# Patient Record
Sex: Female | Born: 1940
Health system: Southern US, Community
[De-identification: ages and names within clinical notes are randomized; demographics above are authoritative.]

## PROBLEM LIST (undated history)

## (undated) DIAGNOSIS — G35 Multiple sclerosis: Secondary | ICD-10-CM

## (undated) DIAGNOSIS — M810 Age-related osteoporosis without current pathological fracture: Secondary | ICD-10-CM

## (undated) DIAGNOSIS — M1990 Unspecified osteoarthritis, unspecified site: Secondary | ICD-10-CM

## (undated) DIAGNOSIS — N8110 Cystocele, unspecified: Secondary | ICD-10-CM

## (undated) DIAGNOSIS — T7840XA Allergy, unspecified, initial encounter: Secondary | ICD-10-CM

## (undated) DIAGNOSIS — K219 Gastro-esophageal reflux disease without esophagitis: Secondary | ICD-10-CM

## (undated) DIAGNOSIS — I8390 Asymptomatic varicose veins of unspecified lower extremity: Secondary | ICD-10-CM

## (undated) DIAGNOSIS — I5022 Chronic systolic (congestive) heart failure: Secondary | ICD-10-CM

## (undated) DIAGNOSIS — E785 Hyperlipidemia, unspecified: Secondary | ICD-10-CM

## (undated) DIAGNOSIS — R079 Chest pain, unspecified: Secondary | ICD-10-CM

## (undated) DIAGNOSIS — H269 Unspecified cataract: Secondary | ICD-10-CM

## (undated) DIAGNOSIS — I340 Nonrheumatic mitral (valve) insufficiency: Secondary | ICD-10-CM

## (undated) DIAGNOSIS — I83009 Varicose veins of unspecified lower extremity with ulcer of unspecified site: Secondary | ICD-10-CM

## (undated) DIAGNOSIS — F329 Major depressive disorder, single episode, unspecified: Secondary | ICD-10-CM

## (undated) DIAGNOSIS — G35D Multiple sclerosis, unspecified: Secondary | ICD-10-CM

## (undated) DIAGNOSIS — F32A Depression, unspecified: Secondary | ICD-10-CM

## (undated) DIAGNOSIS — L97909 Non-pressure chronic ulcer of unspecified part of unspecified lower leg with unspecified severity: Secondary | ICD-10-CM

## (undated) DIAGNOSIS — I4891 Unspecified atrial fibrillation: Secondary | ICD-10-CM

## (undated) DIAGNOSIS — I428 Other cardiomyopathies: Secondary | ICD-10-CM

## (undated) HISTORY — DX: Multiple sclerosis, unspecified: G35.D

## (undated) HISTORY — DX: Multiple sclerosis: G35

## (undated) HISTORY — DX: Varicose veins of unspecified lower extremity with ulcer of unspecified site: I83.009

## (undated) HISTORY — PX: LIPOMA EXCISION: SHX5283

## (undated) HISTORY — DX: Age-related osteoporosis without current pathological fracture: M81.0

## (undated) HISTORY — DX: Unspecified cataract: H26.9

## (undated) HISTORY — DX: Major depressive disorder, single episode, unspecified: F32.9

## (undated) HISTORY — DX: Other cardiomyopathies: I42.8

## (undated) HISTORY — DX: Varicose veins of unspecified lower extremity with ulcer of unspecified site: L97.909

## (undated) HISTORY — PX: EYE SURGERY: SHX253

## (undated) HISTORY — DX: Nonrheumatic mitral (valve) insufficiency: I34.0

## (undated) HISTORY — DX: Hyperlipidemia, unspecified: E78.5

## (undated) HISTORY — DX: Asymptomatic varicose veins of unspecified lower extremity: I83.90

## (undated) HISTORY — DX: Chest pain, unspecified: R07.9

## (undated) HISTORY — DX: Depression, unspecified: F32.A

## (undated) HISTORY — DX: Unspecified osteoarthritis, unspecified site: M19.90

## (undated) HISTORY — PX: BLADDER SURGERY: SHX569

## (undated) HISTORY — DX: Chronic systolic (congestive) heart failure: I50.22

## (undated) HISTORY — DX: Cystocele, unspecified: N81.10

## (undated) HISTORY — DX: Gastro-esophageal reflux disease without esophagitis: K21.9

## (undated) HISTORY — DX: Allergy, unspecified, initial encounter: T78.40XA

## (undated) HISTORY — DX: Unspecified atrial fibrillation: I48.91

## (undated) HISTORY — PX: INGUINAL HERNIA REPAIR: SUR1180

## (undated) HISTORY — PX: OTHER SURGICAL HISTORY: SHX169

---

## 1998-05-15 ENCOUNTER — Ambulatory Visit (HOSPITAL_COMMUNITY): Admission: RE | Admit: 1998-05-15 | Discharge: 1998-05-15 | Payer: Self-pay | Admitting: Internal Medicine

## 1998-05-15 ENCOUNTER — Encounter: Payer: Self-pay | Admitting: Internal Medicine

## 1999-10-19 ENCOUNTER — Other Ambulatory Visit: Admission: RE | Admit: 1999-10-19 | Discharge: 1999-10-19 | Payer: Self-pay | Admitting: Internal Medicine

## 2000-05-07 ENCOUNTER — Encounter: Payer: Self-pay | Admitting: Internal Medicine

## 2000-05-07 ENCOUNTER — Ambulatory Visit (HOSPITAL_COMMUNITY): Admission: RE | Admit: 2000-05-07 | Discharge: 2000-05-07 | Payer: Self-pay | Admitting: Internal Medicine

## 2001-12-23 ENCOUNTER — Other Ambulatory Visit: Admission: RE | Admit: 2001-12-23 | Discharge: 2001-12-23 | Payer: Self-pay | Admitting: Internal Medicine

## 2002-08-04 ENCOUNTER — Encounter (HOSPITAL_BASED_OUTPATIENT_CLINIC_OR_DEPARTMENT_OTHER): Admission: RE | Admit: 2002-08-04 | Discharge: 2002-11-02 | Payer: Self-pay | Admitting: Internal Medicine

## 2002-09-02 ENCOUNTER — Encounter: Payer: Self-pay | Admitting: Internal Medicine

## 2002-09-02 ENCOUNTER — Ambulatory Visit (HOSPITAL_COMMUNITY): Admission: RE | Admit: 2002-09-02 | Discharge: 2002-09-02 | Payer: Self-pay | Admitting: Internal Medicine

## 2002-09-20 ENCOUNTER — Encounter: Payer: Self-pay | Admitting: Internal Medicine

## 2002-09-20 ENCOUNTER — Inpatient Hospital Stay (HOSPITAL_COMMUNITY): Admission: RE | Admit: 2002-09-20 | Discharge: 2002-09-24 | Payer: Self-pay | Admitting: Internal Medicine

## 2004-04-30 ENCOUNTER — Ambulatory Visit: Payer: Self-pay

## 2004-05-24 ENCOUNTER — Ambulatory Visit: Payer: Self-pay | Admitting: Internal Medicine

## 2004-06-19 ENCOUNTER — Ambulatory Visit: Payer: Self-pay

## 2004-06-26 ENCOUNTER — Ambulatory Visit: Payer: Self-pay | Admitting: *Deleted

## 2004-07-23 ENCOUNTER — Ambulatory Visit: Payer: Self-pay | Admitting: Internal Medicine

## 2004-08-21 ENCOUNTER — Ambulatory Visit: Payer: Self-pay | Admitting: Internal Medicine

## 2004-08-22 ENCOUNTER — Ambulatory Visit: Payer: Self-pay | Admitting: Cardiology

## 2004-11-05 ENCOUNTER — Ambulatory Visit: Payer: Self-pay | Admitting: Internal Medicine

## 2005-01-07 ENCOUNTER — Ambulatory Visit: Payer: Self-pay | Admitting: Cardiology

## 2005-01-28 ENCOUNTER — Ambulatory Visit: Payer: Self-pay | Admitting: Cardiology

## 2005-02-05 ENCOUNTER — Ambulatory Visit: Payer: Self-pay | Admitting: Internal Medicine

## 2005-02-11 ENCOUNTER — Ambulatory Visit: Payer: Self-pay | Admitting: Cardiology

## 2005-03-11 ENCOUNTER — Ambulatory Visit: Payer: Self-pay | Admitting: Cardiology

## 2005-03-25 ENCOUNTER — Ambulatory Visit: Payer: Self-pay | Admitting: Cardiology

## 2005-04-22 ENCOUNTER — Ambulatory Visit: Payer: Self-pay | Admitting: Cardiology

## 2005-05-20 ENCOUNTER — Ambulatory Visit: Payer: Self-pay | Admitting: Cardiology

## 2005-06-05 ENCOUNTER — Ambulatory Visit: Payer: Self-pay | Admitting: Internal Medicine

## 2005-06-28 ENCOUNTER — Ambulatory Visit: Payer: Self-pay | Admitting: Cardiology

## 2005-08-01 ENCOUNTER — Ambulatory Visit: Payer: Self-pay | Admitting: Cardiology

## 2005-08-29 ENCOUNTER — Ambulatory Visit: Payer: Self-pay | Admitting: Cardiology

## 2005-08-30 ENCOUNTER — Ambulatory Visit: Payer: Self-pay | Admitting: Internal Medicine

## 2005-09-11 ENCOUNTER — Ambulatory Visit: Payer: Self-pay | Admitting: Internal Medicine

## 2005-09-12 ENCOUNTER — Ambulatory Visit: Payer: Self-pay

## 2005-09-12 ENCOUNTER — Encounter: Payer: Self-pay | Admitting: Cardiology

## 2005-09-26 ENCOUNTER — Ambulatory Visit: Payer: Self-pay | Admitting: Cardiology

## 2005-10-22 ENCOUNTER — Ambulatory Visit: Payer: Self-pay | Admitting: Internal Medicine

## 2005-10-24 ENCOUNTER — Ambulatory Visit: Payer: Self-pay | Admitting: Cardiology

## 2005-10-25 ENCOUNTER — Ambulatory Visit: Payer: Self-pay | Admitting: Cardiology

## 2005-11-05 ENCOUNTER — Ambulatory Visit: Payer: Self-pay | Admitting: Internal Medicine

## 2005-11-21 ENCOUNTER — Ambulatory Visit: Payer: Self-pay | Admitting: Cardiology

## 2005-11-28 ENCOUNTER — Ambulatory Visit: Payer: Self-pay | Admitting: Internal Medicine

## 2005-12-19 ENCOUNTER — Ambulatory Visit: Payer: Self-pay | Admitting: Internal Medicine

## 2006-01-02 ENCOUNTER — Ambulatory Visit: Payer: Self-pay | Admitting: Cardiology

## 2006-01-17 ENCOUNTER — Ambulatory Visit: Payer: Self-pay | Admitting: Cardiology

## 2006-01-24 ENCOUNTER — Other Ambulatory Visit: Admission: RE | Admit: 2006-01-24 | Discharge: 2006-01-24 | Payer: Self-pay | Admitting: Obstetrics and Gynecology

## 2006-01-31 ENCOUNTER — Ambulatory Visit: Payer: Self-pay | Admitting: Cardiology

## 2006-03-13 ENCOUNTER — Ambulatory Visit: Payer: Self-pay | Admitting: Cardiology

## 2006-03-13 ENCOUNTER — Ambulatory Visit: Payer: Self-pay | Admitting: Internal Medicine

## 2006-03-27 ENCOUNTER — Ambulatory Visit: Payer: Self-pay | Admitting: Cardiology

## 2006-04-24 ENCOUNTER — Ambulatory Visit: Payer: Self-pay | Admitting: *Deleted

## 2006-05-22 ENCOUNTER — Ambulatory Visit: Payer: Self-pay | Admitting: Internal Medicine

## 2006-06-19 ENCOUNTER — Ambulatory Visit: Payer: Self-pay | Admitting: Cardiology

## 2006-07-17 ENCOUNTER — Ambulatory Visit: Payer: Self-pay | Admitting: Internal Medicine

## 2006-07-31 ENCOUNTER — Ambulatory Visit: Payer: Self-pay | Admitting: Cardiology

## 2006-09-02 ENCOUNTER — Ambulatory Visit: Payer: Self-pay | Admitting: Cardiology

## 2006-09-22 ENCOUNTER — Ambulatory Visit: Payer: Self-pay | Admitting: Cardiovascular Disease

## 2006-10-10 ENCOUNTER — Ambulatory Visit: Payer: Self-pay | Admitting: Internal Medicine

## 2006-10-20 ENCOUNTER — Ambulatory Visit: Payer: Self-pay | Admitting: Cardiology

## 2006-11-18 ENCOUNTER — Ambulatory Visit: Payer: Self-pay | Admitting: Cardiology

## 2006-11-19 ENCOUNTER — Ambulatory Visit: Payer: Self-pay | Admitting: Internal Medicine

## 2006-11-19 LAB — CONVERTED CEMR LAB
ALT: 26 units/L (ref 0–40)
AST: 23 units/L (ref 0–37)
Albumin: 4 g/dL (ref 3.5–5.2)
Alkaline Phosphatase: 100 units/L (ref 39–117)
BUN: 8 mg/dL (ref 6–23)
Basophils Absolute: 0.1 10*3/uL (ref 0.0–0.1)
Basophils Relative: 0.9 % (ref 0.0–1.0)
Bilirubin Urine: NEGATIVE
Bilirubin, Direct: 0.1 mg/dL (ref 0.0–0.3)
CO2: 31 meq/L (ref 19–32)
Calcium: 9.4 mg/dL (ref 8.4–10.5)
Chloride: 103 meq/L (ref 96–112)
Creatinine, Ser: 0.7 mg/dL (ref 0.4–1.2)
Crystals: NEGATIVE
Eosinophils Absolute: 0.3 10*3/uL (ref 0.0–0.6)
Eosinophils Relative: 4.5 % (ref 0.0–5.0)
GFR calc Af Amer: 108 mL/min
GFR calc non Af Amer: 89 mL/min
Glucose, Bld: 91 mg/dL (ref 70–99)
HCT: 42.8 % (ref 36.0–46.0)
Hemoglobin: 14.4 g/dL (ref 12.0–15.0)
Ketones, ur: NEGATIVE mg/dL
Lymphocytes Relative: 28 % (ref 12.0–46.0)
MCHC: 33.8 g/dL (ref 30.0–36.0)
MCV: 86.1 fL (ref 78.0–100.0)
Monocytes Absolute: 0.6 10*3/uL (ref 0.2–0.7)
Monocytes Relative: 7.9 % (ref 3.0–11.0)
Mucus, UA: NEGATIVE
Neutro Abs: 4.2 10*3/uL (ref 1.4–7.7)
Neutrophils Relative %: 58.7 % (ref 43.0–77.0)
Nitrite: NEGATIVE
Platelets: 244 10*3/uL (ref 150–400)
Potassium: 4.6 meq/L (ref 3.5–5.1)
RBC: 4.97 M/uL (ref 3.87–5.11)
RDW: 13.9 % (ref 11.5–14.6)
Sodium: 138 meq/L (ref 135–145)
Specific Gravity, Urine: 1.01 (ref 1.000–1.03)
TSH: 0.8 microintl units/mL (ref 0.35–5.50)
Total Bilirubin: 1 mg/dL (ref 0.3–1.2)
Total Protein, Urine: NEGATIVE mg/dL
Total Protein: 7.1 g/dL (ref 6.0–8.3)
Urine Glucose: NEGATIVE mg/dL
Urobilinogen, UA: 0.2 (ref 0.0–1.0)
Vit D, 1,25-Dihydroxy: 26 (ref 20–57)
Vitamin B-12: 540 pg/mL (ref 211–911)
WBC: 7.2 10*3/uL (ref 4.5–10.5)
pH: 6 (ref 5.0–8.0)

## 2006-12-01 ENCOUNTER — Ambulatory Visit: Payer: Self-pay | Admitting: Gastroenterology

## 2006-12-15 ENCOUNTER — Ambulatory Visit: Payer: Self-pay | Admitting: Cardiovascular Disease

## 2007-01-12 ENCOUNTER — Ambulatory Visit: Payer: Self-pay | Admitting: Cardiology

## 2007-02-09 ENCOUNTER — Ambulatory Visit: Payer: Self-pay | Admitting: Cardiology

## 2007-03-09 ENCOUNTER — Ambulatory Visit: Payer: Self-pay | Admitting: Internal Medicine

## 2007-04-06 ENCOUNTER — Ambulatory Visit: Payer: Self-pay | Admitting: Internal Medicine

## 2007-04-10 ENCOUNTER — Encounter: Payer: Self-pay | Admitting: Internal Medicine

## 2007-04-10 DIAGNOSIS — I059 Rheumatic mitral valve disease, unspecified: Secondary | ICD-10-CM | POA: Insufficient documentation

## 2007-04-10 DIAGNOSIS — I872 Venous insufficiency (chronic) (peripheral): Secondary | ICD-10-CM | POA: Insufficient documentation

## 2007-04-10 DIAGNOSIS — F329 Major depressive disorder, single episode, unspecified: Secondary | ICD-10-CM | POA: Insufficient documentation

## 2007-04-10 DIAGNOSIS — L97909 Non-pressure chronic ulcer of unspecified part of unspecified lower leg with unspecified severity: Secondary | ICD-10-CM

## 2007-04-10 DIAGNOSIS — N912 Amenorrhea, unspecified: Secondary | ICD-10-CM | POA: Insufficient documentation

## 2007-04-10 DIAGNOSIS — F321 Major depressive disorder, single episode, moderate: Secondary | ICD-10-CM | POA: Insufficient documentation

## 2007-04-10 DIAGNOSIS — I4891 Unspecified atrial fibrillation: Secondary | ICD-10-CM | POA: Insufficient documentation

## 2007-04-10 DIAGNOSIS — I83009 Varicose veins of unspecified lower extremity with ulcer of unspecified site: Secondary | ICD-10-CM | POA: Insufficient documentation

## 2007-04-13 ENCOUNTER — Ambulatory Visit: Payer: Self-pay | Admitting: Internal Medicine

## 2007-04-13 DIAGNOSIS — I509 Heart failure, unspecified: Secondary | ICD-10-CM | POA: Insufficient documentation

## 2007-04-15 LAB — CONVERTED CEMR LAB
ALT: 26 units/L (ref 0–35)
AST: 21 units/L (ref 0–37)
Albumin: 3.8 g/dL (ref 3.5–5.2)
Alkaline Phosphatase: 81 units/L (ref 39–117)
BUN: 12 mg/dL (ref 6–23)
Basophils Absolute: 0 10*3/uL (ref 0.0–0.1)
Basophils Relative: 0 % (ref 0.0–1.0)
Bilirubin, Direct: 0.2 mg/dL (ref 0.0–0.3)
CO2: 31 meq/L (ref 19–32)
Calcium: 9.2 mg/dL (ref 8.4–10.5)
Chloride: 109 meq/L (ref 96–112)
Cholesterol: 125 mg/dL (ref 0–200)
Creatinine, Ser: 0.9 mg/dL (ref 0.4–1.2)
Eosinophils Absolute: 0.3 10*3/uL (ref 0.0–0.6)
Eosinophils Relative: 3.5 % (ref 0.0–5.0)
GFR calc Af Amer: 80 mL/min
GFR calc non Af Amer: 66 mL/min
Glucose, Bld: 109 mg/dL — ABNORMAL HIGH (ref 70–99)
HCT: 42.7 % (ref 36.0–46.0)
HDL: 44.4 mg/dL (ref >39.0)
Hemoglobin: 14.4 g/dL (ref 12.0–15.0)
LDL Cholesterol: 71 mg/dL (ref 0–99)
Lymphocytes Relative: 23.4 % (ref 12.0–46.0)
MCHC: 33.7 g/dL (ref 30.0–36.0)
MCV: 87.6 fL (ref 78.0–100.0)
Monocytes Absolute: 0.5 10*3/uL (ref 0.2–0.7)
Monocytes Relative: 6.5 % (ref 3.0–11.0)
Neutro Abs: 4.8 10*3/uL (ref 1.4–7.7)
Neutrophils Relative %: 66.6 % (ref 43.0–77.0)
Platelets: 211 10*3/uL (ref 150–400)
Potassium: 4.4 meq/L (ref 3.5–5.1)
RBC: 4.88 M/uL (ref 3.87–5.11)
RDW: 13.5 % (ref 11.5–14.6)
Sodium: 144 meq/L (ref 135–145)
TSH: 1.03 microintl units/mL (ref 0.35–5.50)
Total Bilirubin: 0.9 mg/dL (ref 0.3–1.2)
Total CHOL/HDL Ratio: 2.8
Total Protein: 6.8 g/dL (ref 6.0–8.3)
Triglycerides: 47 mg/dL (ref 0–149)
VLDL: 9 mg/dL (ref 0–40)
WBC: 7.3 10*3/uL (ref 4.5–10.5)

## 2007-04-27 ENCOUNTER — Ambulatory Visit: Payer: Self-pay | Admitting: Cardiovascular Disease

## 2007-05-25 ENCOUNTER — Ambulatory Visit: Payer: Self-pay | Admitting: Internal Medicine

## 2007-06-22 ENCOUNTER — Ambulatory Visit: Payer: Self-pay | Admitting: Cardiology

## 2007-07-17 ENCOUNTER — Ambulatory Visit: Payer: Self-pay | Admitting: Internal Medicine

## 2007-08-14 ENCOUNTER — Ambulatory Visit: Payer: Self-pay | Admitting: Cardiology

## 2007-08-14 ENCOUNTER — Ambulatory Visit: Payer: Self-pay | Admitting: Internal Medicine

## 2007-09-11 ENCOUNTER — Ambulatory Visit: Payer: Self-pay | Admitting: Cardiovascular Disease

## 2007-09-25 ENCOUNTER — Ambulatory Visit: Payer: Self-pay | Admitting: Cardiology

## 2007-10-22 ENCOUNTER — Ambulatory Visit: Payer: Self-pay | Admitting: Cardiology

## 2007-11-10 ENCOUNTER — Telehealth: Payer: Self-pay | Admitting: Internal Medicine

## 2007-11-19 ENCOUNTER — Ambulatory Visit: Payer: Self-pay | Admitting: Cardiology

## 2007-12-17 ENCOUNTER — Ambulatory Visit: Payer: Self-pay | Admitting: Internal Medicine

## 2007-12-28 ENCOUNTER — Ambulatory Visit: Payer: Self-pay | Admitting: Internal Medicine

## 2008-01-18 ENCOUNTER — Ambulatory Visit: Payer: Self-pay | Admitting: Cardiovascular Disease

## 2008-02-01 ENCOUNTER — Ambulatory Visit: Payer: Self-pay | Admitting: Cardiology

## 2008-02-15 ENCOUNTER — Ambulatory Visit: Payer: Self-pay | Admitting: Internal Medicine

## 2008-03-15 ENCOUNTER — Encounter: Payer: Self-pay | Admitting: Internal Medicine

## 2008-03-15 ENCOUNTER — Ambulatory Visit: Payer: Self-pay | Admitting: Cardiology

## 2008-04-01 ENCOUNTER — Ambulatory Visit: Payer: Self-pay | Admitting: Internal Medicine

## 2008-04-20 ENCOUNTER — Ambulatory Visit: Payer: Self-pay | Admitting: Cardiology

## 2008-05-04 ENCOUNTER — Ambulatory Visit: Payer: Self-pay | Admitting: Internal Medicine

## 2008-05-27 ENCOUNTER — Ambulatory Visit: Payer: Self-pay | Admitting: Internal Medicine

## 2008-06-27 ENCOUNTER — Ambulatory Visit: Payer: Self-pay | Admitting: Internal Medicine

## 2008-08-04 ENCOUNTER — Ambulatory Visit: Payer: Self-pay | Admitting: Cardiovascular Disease

## 2008-08-30 DIAGNOSIS — I428 Other cardiomyopathies: Secondary | ICD-10-CM | POA: Insufficient documentation

## 2008-08-30 DIAGNOSIS — E785 Hyperlipidemia, unspecified: Secondary | ICD-10-CM | POA: Insufficient documentation

## 2008-08-30 DIAGNOSIS — I080 Rheumatic disorders of both mitral and aortic valves: Secondary | ICD-10-CM | POA: Insufficient documentation

## 2008-09-15 ENCOUNTER — Ambulatory Visit: Payer: Self-pay | Admitting: Cardiology

## 2008-10-13 ENCOUNTER — Ambulatory Visit: Payer: Self-pay | Admitting: Internal Medicine

## 2008-11-10 ENCOUNTER — Ambulatory Visit: Payer: Self-pay | Admitting: Internal Medicine

## 2008-11-22 ENCOUNTER — Encounter: Payer: Self-pay | Admitting: *Deleted

## 2008-12-15 ENCOUNTER — Inpatient Hospital Stay (HOSPITAL_COMMUNITY): Admission: AD | Admit: 2008-12-15 | Discharge: 2008-12-15 | Payer: Self-pay | Admitting: Obstetrics and Gynecology

## 2008-12-28 ENCOUNTER — Encounter: Payer: Self-pay | Admitting: *Deleted

## 2009-01-30 ENCOUNTER — Ambulatory Visit (HOSPITAL_COMMUNITY): Admission: AD | Admit: 2009-01-30 | Discharge: 2009-01-30 | Payer: Self-pay | Admitting: Obstetrics and Gynecology

## 2009-01-30 ENCOUNTER — Telehealth: Payer: Self-pay | Admitting: Cardiology

## 2009-01-30 ENCOUNTER — Encounter: Payer: Self-pay | Admitting: Internal Medicine

## 2009-01-31 ENCOUNTER — Ambulatory Visit (HOSPITAL_COMMUNITY): Admission: AD | Admit: 2009-01-31 | Discharge: 2009-01-31 | Payer: Self-pay | Admitting: Obstetrics & Gynecology

## 2009-02-01 ENCOUNTER — Encounter (INDEPENDENT_AMBULATORY_CARE_PROVIDER_SITE_OTHER): Payer: Self-pay | Admitting: Cardiology

## 2009-02-01 ENCOUNTER — Encounter: Payer: Self-pay | Admitting: Internal Medicine

## 2009-02-01 ENCOUNTER — Ambulatory Visit (HOSPITAL_COMMUNITY): Admission: RE | Admit: 2009-02-01 | Discharge: 2009-02-01 | Payer: Self-pay | Admitting: Obstetrics and Gynecology

## 2009-02-02 ENCOUNTER — Encounter (INDEPENDENT_AMBULATORY_CARE_PROVIDER_SITE_OTHER): Payer: Self-pay | Admitting: *Deleted

## 2009-02-02 ENCOUNTER — Ambulatory Visit (HOSPITAL_COMMUNITY): Admission: RE | Admit: 2009-02-02 | Discharge: 2009-02-02 | Payer: Self-pay | Admitting: Obstetrics and Gynecology

## 2009-02-03 ENCOUNTER — Ambulatory Visit (HOSPITAL_COMMUNITY): Admission: RE | Admit: 2009-02-03 | Discharge: 2009-02-03 | Payer: Self-pay | Admitting: Obstetrics and Gynecology

## 2009-02-04 ENCOUNTER — Ambulatory Visit (HOSPITAL_COMMUNITY): Admission: RE | Admit: 2009-02-04 | Discharge: 2009-02-04 | Payer: Self-pay | Admitting: Obstetrics and Gynecology

## 2009-02-05 ENCOUNTER — Ambulatory Visit (HOSPITAL_COMMUNITY): Admission: RE | Admit: 2009-02-05 | Discharge: 2009-02-05 | Payer: Self-pay | Admitting: Obstetrics and Gynecology

## 2009-02-16 ENCOUNTER — Ambulatory Visit: Payer: Self-pay | Admitting: Cardiovascular Disease

## 2009-02-16 LAB — CONVERTED CEMR LAB
INR: 1.8
POC INR: 1.8

## 2009-03-23 ENCOUNTER — Ambulatory Visit: Payer: Self-pay | Admitting: Internal Medicine

## 2009-03-23 LAB — CONVERTED CEMR LAB: POC INR: 3.2

## 2009-04-17 ENCOUNTER — Ambulatory Visit: Payer: Self-pay | Admitting: Internal Medicine

## 2009-04-17 DIAGNOSIS — K219 Gastro-esophageal reflux disease without esophagitis: Secondary | ICD-10-CM | POA: Insufficient documentation

## 2009-04-20 ENCOUNTER — Ambulatory Visit: Payer: Self-pay | Admitting: Cardiovascular Disease

## 2009-04-20 ENCOUNTER — Ambulatory Visit: Payer: Self-pay | Admitting: Internal Medicine

## 2009-04-20 DIAGNOSIS — E785 Hyperlipidemia, unspecified: Secondary | ICD-10-CM

## 2009-04-20 DIAGNOSIS — E782 Mixed hyperlipidemia: Secondary | ICD-10-CM | POA: Insufficient documentation

## 2009-04-20 LAB — CONVERTED CEMR LAB
AST: 27 units/L (ref 0–37)
BUN: 12 mg/dL (ref 6–23)
CO2: 28 meq/L (ref 19–32)
Calcium: 8.7 mg/dL (ref 8.4–10.5)
Chloride: 102 meq/L (ref 96–112)
Cholesterol: 140 mg/dL (ref 0–200)
Creatinine, Ser: 0.9 mg/dL (ref 0.4–1.2)
GFR calc non Af Amer: 65.93 mL/min (ref >60)
Glucose, Bld: 98 mg/dL (ref 70–99)
HDL: 42.6 mg/dL (ref >39.00)
LDL Cholesterol: 83 mg/dL (ref 0–99)
POC INR: 2.4
Potassium: 4.1 meq/L (ref 3.5–5.1)
Sodium: 137 meq/L (ref 135–145)
TSH: 1.08 microintl units/mL (ref 0.35–5.50)
Total CHOL/HDL Ratio: 3
Triglycerides: 71 mg/dL (ref 0.0–149.0)
VLDL: 14.2 mg/dL (ref 0.0–40.0)

## 2009-06-06 ENCOUNTER — Ambulatory Visit: Payer: Self-pay | Admitting: Cardiology

## 2009-06-06 ENCOUNTER — Encounter (INDEPENDENT_AMBULATORY_CARE_PROVIDER_SITE_OTHER): Payer: Self-pay | Admitting: Cardiology

## 2009-06-06 LAB — CONVERTED CEMR LAB: POC INR: 2.2

## 2009-07-20 ENCOUNTER — Ambulatory Visit: Payer: Self-pay | Admitting: Internal Medicine

## 2009-07-20 LAB — CONVERTED CEMR LAB: POC INR: 2.5

## 2009-08-17 ENCOUNTER — Ambulatory Visit: Payer: Self-pay | Admitting: Cardiology

## 2009-08-17 LAB — CONVERTED CEMR LAB: POC INR: 2

## 2009-11-07 ENCOUNTER — Ambulatory Visit: Payer: Self-pay | Admitting: Internal Medicine

## 2009-11-07 LAB — CONVERTED CEMR LAB: POC INR: 2.4

## 2009-12-05 ENCOUNTER — Ambulatory Visit: Payer: Self-pay | Admitting: Cardiovascular Disease

## 2009-12-05 LAB — CONVERTED CEMR LAB: POC INR: 2

## 2010-01-03 ENCOUNTER — Ambulatory Visit: Payer: Self-pay | Admitting: Internal Medicine

## 2010-01-03 LAB — CONVERTED CEMR LAB: POC INR: 1.4

## 2010-01-08 ENCOUNTER — Telehealth (INDEPENDENT_AMBULATORY_CARE_PROVIDER_SITE_OTHER): Payer: Self-pay | Admitting: *Deleted

## 2010-01-08 ENCOUNTER — Telehealth: Payer: Self-pay | Admitting: Internal Medicine

## 2010-01-12 ENCOUNTER — Ambulatory Visit: Payer: Self-pay | Admitting: Cardiology

## 2010-01-12 LAB — CONVERTED CEMR LAB: POC INR: 1.9

## 2010-01-18 ENCOUNTER — Ambulatory Visit: Payer: Self-pay | Admitting: Internal Medicine

## 2010-01-18 DIAGNOSIS — M79609 Pain in unspecified limb: Secondary | ICD-10-CM | POA: Insufficient documentation

## 2010-01-18 DIAGNOSIS — M25569 Pain in unspecified knee: Secondary | ICD-10-CM | POA: Insufficient documentation

## 2010-01-18 DIAGNOSIS — N760 Acute vaginitis: Secondary | ICD-10-CM | POA: Insufficient documentation

## 2010-01-18 DIAGNOSIS — M1990 Unspecified osteoarthritis, unspecified site: Secondary | ICD-10-CM | POA: Insufficient documentation

## 2010-01-26 ENCOUNTER — Ambulatory Visit: Payer: Self-pay | Admitting: Cardiovascular Disease

## 2010-01-26 LAB — CONVERTED CEMR LAB: POC INR: 2.5

## 2010-02-02 ENCOUNTER — Encounter: Payer: Self-pay | Admitting: Internal Medicine

## 2010-02-19 ENCOUNTER — Encounter: Payer: Self-pay | Admitting: Internal Medicine

## 2010-02-22 ENCOUNTER — Ambulatory Visit: Payer: Self-pay | Admitting: Internal Medicine

## 2010-02-22 ENCOUNTER — Encounter: Payer: Self-pay | Admitting: Internal Medicine

## 2010-02-22 LAB — CONVERTED CEMR LAB: POC INR: 2

## 2010-03-20 ENCOUNTER — Ambulatory Visit: Payer: Self-pay | Admitting: Cardiovascular Disease

## 2010-03-20 LAB — CONVERTED CEMR LAB: POC INR: 2.9

## 2010-04-19 ENCOUNTER — Ambulatory Visit: Payer: Self-pay | Admitting: Internal Medicine

## 2010-04-26 ENCOUNTER — Ambulatory Visit: Payer: Self-pay | Admitting: Internal Medicine

## 2010-05-01 ENCOUNTER — Ambulatory Visit: Payer: Self-pay | Admitting: Internal Medicine

## 2010-05-01 LAB — CONVERTED CEMR LAB: POC INR: 3.5

## 2010-05-24 ENCOUNTER — Ambulatory Visit: Payer: Self-pay | Admitting: Cardiovascular Disease

## 2010-05-24 LAB — CONVERTED CEMR LAB: POC INR: 2.8

## 2010-06-04 ENCOUNTER — Encounter: Payer: Self-pay | Admitting: Internal Medicine

## 2010-06-04 ENCOUNTER — Ambulatory Visit: Payer: Self-pay

## 2010-06-04 ENCOUNTER — Ambulatory Visit: Payer: Self-pay | Admitting: Internal Medicine

## 2010-06-11 ENCOUNTER — Telehealth: Payer: Self-pay | Admitting: Cardiovascular Disease

## 2010-06-11 LAB — CONVERTED CEMR LAB
BUN: 19 mg/dL (ref 6–23)
CO2: 30 meq/L (ref 19–32)
Calcium: 9.1 mg/dL (ref 8.4–10.5)
Chloride: 101 meq/L (ref 96–112)
Creatinine, Ser: 1 mg/dL (ref 0.4–1.2)
GFR calc non Af Amer: 58.19 mL/min — ABNORMAL LOW (ref >60.00)
Glucose, Bld: 113 mg/dL — ABNORMAL HIGH (ref 70–99)
Potassium: 4.9 meq/L (ref 3.5–5.1)
Pro B Natriuretic peptide (BNP): 165.2 pg/mL — ABNORMAL HIGH (ref 0.0–100.0)
Sodium: 138 meq/L (ref 135–145)
TSH: 1.54 microintl units/mL (ref 0.35–5.50)

## 2010-06-14 ENCOUNTER — Ambulatory Visit: Payer: Self-pay

## 2010-06-14 ENCOUNTER — Encounter: Payer: Self-pay | Admitting: Internal Medicine

## 2010-06-14 ENCOUNTER — Ambulatory Visit: Payer: Self-pay | Admitting: Internal Medicine

## 2010-06-14 ENCOUNTER — Encounter: Payer: Self-pay | Admitting: Cardiovascular Disease

## 2010-06-14 ENCOUNTER — Ambulatory Visit (HOSPITAL_COMMUNITY)
Admission: RE | Admit: 2010-06-14 | Discharge: 2010-06-14 | Payer: Self-pay | Source: Home / Self Care | Attending: Internal Medicine | Admitting: Internal Medicine

## 2010-07-05 ENCOUNTER — Telehealth: Payer: Self-pay | Admitting: Internal Medicine

## 2010-07-09 ENCOUNTER — Ambulatory Visit
Admission: RE | Admit: 2010-07-09 | Discharge: 2010-07-09 | Payer: Self-pay | Source: Home / Self Care | Attending: Cardiology | Admitting: Cardiology

## 2010-07-09 LAB — CONVERTED CEMR LAB: POC INR: 3.2

## 2010-07-12 ENCOUNTER — Telehealth: Payer: Self-pay | Admitting: Internal Medicine

## 2010-07-24 NOTE — Cardiovascular Report (Signed)
Summary: [facility] Patient Summary  [facility] Patient Summary   Imported By: Roderic Ovens 06/14/2009 13:59:18  _____________________________________________________________________  External Attachment:    Type:   Image     Comment:   External Document

## 2010-07-24 NOTE — Medication Information (Signed)
Summary: rov/ln  Anticoagulant Therapy  Managed by: Cloyde Reams, RN, BSN Referring MD: Dietrich Pates Supervising MD: Daleen Squibb MD, Maisie Fus Indication 1: Atrial Fibrillation (ICD-427.31) Lab Used: [facility] Site: [facility] INR POC 1.9 INR RANGE 2 - 3  Dietary changes: no    Health status changes: no    Bleeding/hemorrhagic complications: yes       Details: Sl amt of BRB assoc with BM, no further episodes.    Recent/future hospitalizations: no    Any changes in medication regimen? no    Recent/future dental: no  Any missed doses?: no       Is patient compliant with meds? yes       Allergies: No Known Drug Allergies  Anticoagulation Management History:      The patient is taking warfarin and comes in today for a routine follow up visit.  Positive risk factors for bleeding include an age of 6 years or older.  The bleeding index is 'intermediate risk'.  Positive CHADS2 values include History of CHF.  Negative CHADS2 values include Age > 76 years old.  The start date was 12/23/2001.  Her last INR was 1.8.  Anticoagulation responsible provider: Daleen Squibb MD, Maisie Fus.  INR POC: 1.9.  Cuvette Lot#: [id number].  Exp: 03/2011.    Anticoagulation Management Assessment/Plan:      The patient's current anticoagulation dose is Coumadin 5 mg  tabs: Take as directed by coumadin clinic..  The target INR is 2 - 3.  The next INR is due 01/26/2010.  Anticoagulation instructions were given to patient.  Results were reviewed/authorized by Cloyde Reams, RN, BSN.  She was notified by Cloyde Reams RN.         Prior Anticoagulation Instructions: INR 1.4  Take 2 tabs today and tomorrow and then resume 1.5 tabs on Sunday, Tuesday, and Thursday and 1 tab on Monday, Wednesday, Friday, and Saturday.  Re-check INR in 10 days.  Current Anticoagulation Instructions: INR 1.9  Take 1.5 tablets today, then resume same dosage 1 tablet daily except 1.5 tablets on Sundays, Tuesdays, and Thursdays.  Recheck in 2 weeks.    Prescriptions: COUMADIN 5 MG  TABS (WARFARIN SODIUM) Take as directed by coumadin clinic. Brand medically necessary #60 x 1   Entered by:   Erika Johnson RN   Authorized by:   Paula Virginia Ross, MD, FACC   Signed by:   Erika Johnson RN on 01/12/2010   Method used:   Electronically to        [facility]  [address] # [facility]* (retail)       [address]       [place], [place]  [address]       Ph: [phone] or [phone]       Fax: [phone]   RxID:   [id number]

## 2010-07-24 NOTE — Assessment & Plan Note (Signed)
Summary: 3 MO ROV /NWS  #   Vital Signs:  Patient profile:   70 year old female Height:      71 inches Weight:      175 pounds BMI:     24.50 Temp:     97.1 degrees F oral Pulse rate:   76 / minute Pulse rhythm:   regular Resp:     16 per minute BP sitting:   110 / 86  (left arm) Cuff size:   regular  Vitals Entered By: Lanier Prude, CMA(AAMA) (April 26, 2010 10:08 AM) CC: 3 mo f/u Is Patient Diabetic? No Comments pt states she is not taking Toviaz   CC:  3 mo f/u.  History of Present Illness: The patient presents for a follow up of OA, A fib, hyperlipidemia   Current Medications (verified): 1)  Klor-Con M20 20 Meq  Tbcr (Potassium Chloride Crys Cr) .... 1/2 Once Daily 2)  Coumadin 5 Mg  Tabs (Warfarin Sodium) .... Take As Directed By Coumadin Clinic. 3)  Coreg 3.125 Mg  Tabs (Carvedilol) .... Two Times A Day 4)  Tikosyn 500 Mcg  Caps (Dofetilide) .... Two Times A Day 5)  Lipitor 20 Mg  Tabs (Atorvastatin Calcium) .... 1/2 Once Daily 6)  Oxybutynin Chloride 5 Mg Tb24 (Oxybutynin Chloride) .Marland Kitchen.. 1 By Mouth Qd 7)  Calicum .... Daily 8)  Omeprazole 40 Mg Cpdr (Omeprazole) .Marland Kitchen.. 1 Tablet Every Day 9)  Toviaz 8 Mg Xr24h-Tab (Fesoterodine Fumarate) .Marland Kitchen.. 1 By Mouth Daily. 10)  Premarin 0.625 Mg/gm Crea (Estrogens, Conjugated) .... Use 1 G Pv At Bedtime X 3 D, Then Every Other Day X 6 D, Then Weekly 11)  Trimo-San 0.025 % Gel (Oxyquinolone Sulfate) .... Use Once Daily Pv 12)  Tramadol Hcl 50 Mg Tabs (Tramadol Hcl) .Marland Kitchen.. 1-2 Tabs By Mouth Two Times A Day As Needed Pain  Allergies (verified): No Known Drug Allergies  Past History:  Past Medical History: Last updated: 01/18/2010 CARDIOMYOPATHY (ICD-425.4) MITRAL REGURGITATION, MILD (ICD-396.3) DYSLIPIDEMIA (ICD-272.4) CONGESTIVE HEART FAILURE (ICD-428.0) ATRIAL FIBRILLATION (ICD-427.31) DISEASE, MITRAL VALVE NEC/NOS (ICD-394.9) STASIS ULCER (ICD-454.0) ABSENCE, MENSTRUATION (ICD-626.0) CORONARY ARTERY DISEASE, FAMILY  HX (ICD-V17.3) VENOUS INSUFFICIENCY, LEGS (ICD-459.81) DEPRESSION (ICD-311) Gyn Dr Layne Benton Urol Dr McDearmid   Osteoarthritis  Social History: Last updated: 04/13/2007 Retired Divorced Never Smoked Alcohol use-no  Review of Systems  The patient denies fever, weight loss, dyspnea on exertion, and abdominal pain.    Physical Exam  General:  Well developed, well nourished, in no acute distress. Mouth:  Oral mucosa and oropharynx without lesions or exudates.  Teeth in good repair. Neck:  JVP normal. No bruits Lungs:  clear to auscultation. No rales, or wheeze Heart:  irregular rate and rhythm. S1, S2. No S3. No murmur Abdomen:  supple. No hepatomegaly Msk:  B knees w/crepitus R big toe deformed Neurologic:  No cranial nerve deficits noted. Station and gait are normal. Plantar reflexes are down-going bilaterally. DTRs are symmetrical throughout. Sensory, motor and coordinative functions appear intact. Skin:  Hyperpigmented lower legs Psych:  Cognition and judgment appear intact. Alert and cooperative with normal attention span and concentration. No apparent delusions, illusions, hallucinations   Impression & Recommendations:  Problem # 1:  ATRIAL FIBRILLATION (ICD-427.31) Assessment Unchanged  Her updated medication list for this problem includes:    Coumadin 5 Mg Tabs (Warfarin sodium) .Marland Kitchen... Take as directed by coumadin clinic.    Coreg 3.125 Mg Tabs (Carvedilol) .Marland Kitchen..Marland Kitchen Two times a day    Tikosyn 500 Mcg Caps (  Dofetilide) .Marland Kitchen..Marland Kitchen Two times a day  Problem # 2:  DEPRESSION (ICD-311) Assessment: Improved  Problem # 3:  COUMADIN THERAPY (ICD-V58.61) Assessment: Unchanged On the regimen reflected in the chart    Problem # 4:  OSTEOARTHRITIS (ICD-715.90) Assessment: Unchanged  Her updated medication list for this problem includes:    Tramadol Hcl 50 Mg Tabs (Tramadol hcl) .Marland Kitchen... 1-2 tabs by mouth two times a day as needed pain  Problem # 5:  PURE HYPERCHOLESTEROLEMIA  (ICD-272.0) Assessment: Improved  Her updated medication list for this problem includes:    Lipitor 20 Mg Tabs (Atorvastatin calcium) .Marland Kitchen... 1/2 once daily  Complete Medication List: 1)  Klor-con M20 20 Meq Tbcr (Potassium chloride crys cr) .... 1/2 once daily 2)  Coumadin 5 Mg Tabs (Warfarin sodium) .... Take as directed by coumadin clinic. 3)  Coreg 3.125 Mg Tabs (Carvedilol) .... Two times a day 4)  Tikosyn 500 Mcg Caps (Dofetilide) .... Two times a day 5)  Lipitor 20 Mg Tabs (Atorvastatin calcium) .... 1/2 once daily 6)  Oxybutynin Chloride 5 Mg Tb24 (Oxybutynin chloride) .Marland Kitchen.. 1 by mouth qd 7)  Omeprazole 40 Mg Cpdr (Omeprazole) .Marland Kitchen.. 1 tablet every day 8)  Premarin 0.625 Mg/gm Crea (Estrogens, conjugated) .... Use 1 g pv at bedtime x 3 d, then every other day x 6 d, then weekly 9)  Trimo-san 0.025 % Gel (Oxyquinolone sulfate) .... Use once daily pv 10)  Tramadol Hcl 50 Mg Tabs (Tramadol hcl) .Marland Kitchen.. 1-2 tabs by mouth two times a day as needed pain 11)  Vitamin D 1000 Unit Tabs (Cholecalciferol) .Marland Kitchen.. 1 by mouth qd  Other Orders: Tdap => 43yrs IM (40981) Admin 1st Vaccine (19147) Zoster (Shingles) Vaccine Live [phone]) Admin of Any Addtl Vaccine (21308)  Patient Instructions: 1)  Please schedule a follow-up appointment in 3 months well w/labs. Prescriptions: TIKOSYN 500 MCG  CAPS (DOFETILIDE) two times a day  #60 Capsule x 11   Entered and Authorized by:   Tresa Garter MD   Signed by:   Tresa Garter MD on 04/26/2010   Method used:   Print then Give to Patient   RxID:   [id number] OXYBUTYNIN CHLORIDE 5 MG TB24 (OXYBUTYNIN CHLORIDE) 1 by mouth qd  #30 Tablet x 11   Entered and Authorized by:   Tresa Garter MD   Signed by:   Tresa Garter MD on 04/26/2010   Method used:   Print then Give to Patient   RxID:   [id number] COREG 3.125 MG  TABS (CARVEDILOL) two times a day  #60 Tablet x 11   Entered and Authorized by:   Tresa Garter MD    Signed by:   Tresa Garter MD on 04/26/2010   Method used:   Print then Give to Patient   RxID:   [id number] COUMADIN 5 MG  TABS (WARFARIN SODIUM) Take as directed by coumadin clinic. Brand medically necessary #40 x 11   Entered and Authorized by:   Tresa Garter MD   Signed by:   Tresa Garter MD on 04/26/2010   Method used:   Print then Give to Patient   RxID:   [id number]    Orders Added: 1)  Tdap => 3yrs IM [90715] 2)  Admin 1st Vaccine [90471] 3)  Zoster (Shingles) Vaccine Live [60109] 4)  Admin of Any Addtl Vaccine [90472] 5)  Est. Patient Level IV [32355]   Immunization History:  Influenza Immunization History:    Influenza:  historical (  04/17/2010)  Pneumovax Immunization History:    Pneumovax:  historical (02/02/2009)  Immunizations Administered:  Tetanus Vaccine:    Vaccine Type: Tdap    Site: left deltoid    Mfr: GlaxoSmithKline    Dose: 0.5 ml    Route: IM    Given by: Lanier Prude, CMA(AAMA)    Exp. Date: 04/12/2012    Lot #: [id number]    VIS given: 05/11/08 version given April 26, 2010.  Zostavax # 1:    Vaccine Type: Zostavax    Site: right deltoid    Mfr: Merck    Dose: 0.65    Route: [place]    Given by: Lanier Prude, CMA(AAMA)    Exp. Date: 01/26/2011    Lot #: [id number]    VIS given: 04/05/05 given April 26, 2010.   Immunization History:  Influenza Immunization History:    Influenza:  Historical (04/17/2010)  Pneumovax Immunization History:    Pneumovax:  Historical (02/02/2009)  Immunizations Administered:  Tetanus Vaccine:    Vaccine Type: Tdap    Site: left deltoid    Mfr: GlaxoSmithKline    Dose: 0.5 ml    Route: IM    Given by: Lanier Prude, CMA(AAMA)    Exp. Date: 04/12/2012    Lot #: [id number]    VIS given: 05/11/08 version given April 26, 2010.  Zostavax # 1:    Vaccine Type: Zostavax    Site: right deltoid    Mfr: Merck    Dose: 0.65    Route: [place]    Given by: Lanier Prude, CMA(AAMA)    Exp. Date: 01/26/2011    Lot #: [id number]    VIS given: 04/05/05 given April 26, 2010.

## 2010-07-24 NOTE — Progress Notes (Signed)
Summary: Rf Kcl  Phone Note Refill Request Message from:  Pharmacy  Refills Requested: Medication #1:  KLOR-CON M20 20 MEQ  TBCR 1/2 once daily   Dosage confirmed as above?Dosage Confirmed   Supply Requested: 1 month   Last Refilled: 11/13/2009 last OV with you was 2009.  Medication has been being RF but per policy pt needs OV. Right?   Method Requested: Electronic Initial call taken by: Lanier Prude, [facility]),  January 08, 2010 11:31 AM  Follow-up for Phone Call        ok to ref x 6 Sch OV pls Follow-up by: Tresa Garter MD,  January 08, 2010 1:00 PM  Additional Follow-up for Phone Call Additional follow up Details #1::        Rx called to pharmacy Additional Follow-up by: Lanier Prude, [facility]),  January 08, 2010 2:04 PM    Prescriptions: KLOR-CON M20 20 MEQ  TBCR (POTASSIUM CHLORIDE CRYS CR) 1/2 once daily  #30 x 6   Entered by:   Lanier Prude, [facility])   Authorized by:   Tresa Garter MD   Signed by:   Lanier Prude, [facility]) on 01/08/2010   Method used:   Electronically to        [facility]  [address] # 11350* (retail)       [address]       [place], [place]  [address]       Ph: [phone] or [phone]       Fax: [phone]   RxID:   [id number]

## 2010-07-24 NOTE — Medication Information (Signed)
Summary: rov/sp  Anticoagulant Therapy  Managed by: Weston Brass, PharmD Referring MD: Dietrich Pates Supervising MD: Johney Frame MD, Fayrene Fearing Indication 1: Atrial Fibrillation (ICD-427.31) Lab Used: [facility] Site: [facility] INR POC 1.4 INR RANGE 2 - 3  Dietary changes: yes       Details: has been eating more turnip greens  Health status changes: no    Bleeding/hemorrhagic complications: no    Recent/future hospitalizations: no    Any changes in medication regimen? no    Recent/future dental: no  Any missed doses?: yes     Details: pt states that she may have missed one or two doses  Is patient compliant with meds? yes       Allergies: No Known Drug Allergies  Anticoagulation Management History:      The patient is taking warfarin and comes in today for a routine follow up visit.  Positive risk factors for bleeding include an age of 71 years or older.  The bleeding index is 'intermediate risk'.  Positive CHADS2 values include History of CHF.  Negative CHADS2 values include Age > 25 years old.  The start date was 12/23/2001.  Her last INR was 1.8.  Anticoagulation responsible provider: Leam Madero MD, Fayrene Fearing.  INR POC: 1.4.  Cuvette Lot#: [id number].  Exp: 02/2011.    Anticoagulation Management Assessment/Plan:      The patient's current anticoagulation dose is Coumadin 5 mg  tabs: Take as directed by coumadin clinic..  The target INR is 2 - 3.  The next INR is due 01/12/2010.  Anticoagulation instructions were given to patient.  Results were reviewed/authorized by Weston Brass, PharmD.  She was notified by Dillard Cannon.         Prior Anticoagulation Instructions: INR 2.0  Continue same dose of 1 tablet every day except 1 1/2 tablets on Sunday, Tuesday and Thursday   Current Anticoagulation Instructions: INR 1.4  Take 2 tabs today and tomorrow and then resume 1.5 tabs on Sunday, Tuesday, and Thursday and 1 tab on Monday, Wednesday, Friday, and Saturday.  Re-check INR in 10 days.

## 2010-07-24 NOTE — Letter (Signed)
Summary: [facility]  [address]   [place], [place] [address]   Phone: [phone]  Fax: [phone]     February 01, 2009 MRN: [id number]   [name] [address] [place], [place]  [address]   Dear Ms. RYCE,  This letter is being sent to you as a reminder that it is necessary for you to get your INR/PT checked regularly so that we can optimize your care.  Our records indicate that you were scheduled to have a test done recently.  As of today, we have not received the results of this test.  It is very important that you have your INR checked.  Please call our office at the number listed above to schedule an appointment at your earliest convenience.    If you have recently had your protime checked or have discontinued this medication, please contact our office at the above phone number to clarify this issue.  Thank you for this prompt attention to this important health care matter.  Sincerely,   [facility] Reduction [facility] Team

## 2010-07-24 NOTE — Medication Information (Signed)
Summary: rov/ewj  Anticoagulant Therapy  Managed by: Bethena Midget, RN, BSN Referring MD: Dietrich Pates Supervising MD: Graciela Husbands MD, Viviann Spare Indication 1: Atrial Fibrillation (ICD-427.31) Lab Used: [facility] Site: [facility] INR POC 2.0 INR RANGE 2 - 3  Dietary changes: no    Health status changes: no    Bleeding/hemorrhagic complications: no    Recent/future hospitalizations: yes       Details: Pending Rt foot Surgery 03/05/10 needs to be off for 5 days and Dr Gladstone Lighter has approved pt to stop.   Any changes in medication regimen? no    Recent/future dental: no  Any missed doses?: no       Is patient compliant with meds? yes       Allergies: No Known Drug Allergies  Anticoagulation Management History:      The patient is taking warfarin and comes in today for a routine follow up visit.  Positive risk factors for bleeding include an age of 71 years or older.  The bleeding index is 'intermediate risk'.  Positive CHADS2 values include History of CHF.  Negative CHADS2 values include Age > 48 years old.  The start date was 12/23/2001.  Her last INR was 1.8.  Anticoagulation responsible provider: Graciela Husbands MD, Viviann Spare.  INR POC: 2.0.  Cuvette Lot#: [id number].  Exp: 03/2011.    Anticoagulation Management Assessment/Plan:      The patient's current anticoagulation dose is Coumadin 5 mg  tabs: Take as directed by coumadin clinic..  The target INR is 2 - 3.  The next INR is due 03/15/2010.  Anticoagulation instructions were given to patient.  Results were reviewed/authorized by Bethena Midget, RN, BSN.  She was notified by Bethena Midget, RN, BSN.         Prior Anticoagulation Instructions: INR 2.5  Continue on same dosage 1 tablet daily except 1.5 tablets on Sundays, Tuesdays, and Thursdays.   Recheck in 4 weeks.    Current Anticoagulation Instructions: INR 2.0 Today 10mg s then resume 5mg s everyday except 7.5mg s on Tuesdays, Thursdays and Sundays. Recheck in 3 weeks. Last dose will be 01/27/10 if  surgery is on 02/02/10.

## 2010-07-24 NOTE — Medication Information (Signed)
Summary: rov/tm   Anticoagulant Therapy  Managed by: Weston Brass, PharmD Referring MD: Dietrich Pates Supervising MD: Eden Emms MD, Theron Arista Indication 1: Atrial Fibrillation (ICD-427.31) Lab Used: [facility] Site: [facility] INR POC 2.8 INR RANGE 2 - 3   Health status changes: no    Bleeding/hemorrhagic complications: yes       Details: some vaginal bleeding but only occured once  Recent/future hospitalizations: no    Any changes in medication regimen? no    Recent/future dental: no  Any missed doses?: no       Is patient compliant with meds? yes       Allergies: No Known Drug Allergies  Anticoagulation Management History:      The patient is taking warfarin and comes in today for a routine follow up visit.  Positive risk factors for bleeding include an age of 70 years or older.  The bleeding index is 'intermediate risk'.  Positive CHADS2 values include History of CHF.  Negative CHADS2 values include Age > 52 years old.  The start date was 12/23/2001.  Her last INR was 1.8.  Anticoagulation responsible provider: Eden Emms MD, Theron Arista.  INR POC: 2.8.  Cuvette Lot#: [id number].  Exp: 05/2011.    Anticoagulation Management Assessment/Plan:      The patient's current anticoagulation dose is Coumadin 5 mg  tabs: Take as directed by coumadin clinic..  The target INR is 2 - 3.  The next INR is due 06/21/2010.  Anticoagulation instructions were given to patient.  Results were reviewed/authorized by Weston Brass, PharmD.  She was notified by Weston Brass PharmD.         Prior Anticoagulation Instructions: INR 3.5 Skip today's dose then resume 5mg s daily except 7.5mg s on Tuesdays, Thursdays and Sundays. Recheck in 3 weeks.   Current Anticoagulation Instructions: INR 2.8  Continue same dose of 1 tablet every day except 1 1/2 tablets on Sunday, Tuesday and Thursday.  Recheck INR in 4 weeks.

## 2010-07-24 NOTE — Progress Notes (Signed)
----   Converted from flag ---- ---- 01/08/2010 2:05 PM, Lanier Prude, CMA(AAMA) wrote: Please sched OV with AVP or preferred PCP.  Pt is past due for f/u.   Thanks,  Norva Pavlov ------------------------------  Gave pt appt -phone:  01/18/10@ 930A w/Dr Plotnikov

## 2010-07-24 NOTE — Miscellaneous (Signed)
Summary: delinquent letter---cvrr mailed  Clinical Lists Changes

## 2010-07-24 NOTE — Assessment & Plan Note (Signed)
Medications Added OXYBUTYNIN CHLORIDE 5 MG TB24 (OXYBUTYNIN CHLORIDE) 1 by mouth qd      Allergies Added: NKDA   History of Present Illness: 6 mo f/u  Current Allergies: No known allergies   Past Medical History:    Depression    ef 50% on echo    Atrial fibrillation    Congestive heart failure   Family History:    Cancer  Social History:    Retired    Divorced    Never Smoked    Alcohol use-no   Risk Factors:  Tobacco use:  never Alcohol use:  no    Physical Exam  General:     Well-developed,well-nourished,in no acute distress; alert,appropriate and cooperative throughout examination Head:     Normocephalic and atraumatic without obvious abnormalities. No apparent alopecia or balding. Mouth:     Oral mucosa and oropharynx without lesions or exudates.  Teeth in good repair. Lungs:     Normal respiratory effort, chest expands symmetrically. Lungs are clear to auscultation, no crackles or wheezes. Heart:     Normal rate and regular rhythm. S1 and S2 normal without gallop, murmur, click, rub or other extra sounds. Abdomen:     Bowel sounds positive,abdomen soft and non-tender without masses, organomegaly or hernias noted. Extremities:     trace left pedal edema and trace right pedal edema.   Neurologic:     No cranial nerve deficits noted. Station and gait are normal. Plantar reflexes are down-going bilaterally. DTRs are symmetrical throughout. Sensory, motor and coordinative functions appear intact. Psych:     Cognition and judgment appear intact. Alert and cooperative with normal attention span and concentration. No apparent delusions, illusions, hallucinations    Impression & Recommendations:  Problem # 1:  ATRIAL FIBRILLATION (ICD-427.31) Assessment: Unchanged  Her updated medication list for this problem includes:    Coumadin 5 Mg Tabs (Warfarin sodium) .Marland Kitchen... As directed    Coreg 3.125 Mg Tabs (Carvedilol) .Marland Kitchen..Marland Kitchen Two times a day    Tikosyn 500  Mcg Caps (Dofetilide) .Marland Kitchen..Marland Kitchen Two times a day   Problem # 2:  STASIS ULCER (ICD-454.0) Assessment: Comment Only  Problem # 3:  VENOUS INSUFFICIENCY, LEGS (ICD-459.81) Assessment: Improved  Problem # 4:  CORONARY ARTERY DISEASE, FAMILY HX (ICD-V17.3) Assessment: Unchanged  Complete Medication List: 1)  Klor-con M20 20 Meq Tbcr (Potassium chloride crys cr) .... 1/2 once daily 2)  Coumadin 5 Mg Tabs (Warfarin sodium) .... As directed 3)  Coreg 3.125 Mg Tabs (Carvedilol) .... Two times a day 4)  Tikosyn 500 Mcg Caps (Dofetilide) .... Two times a day 5)  Lipitor 20 Mg Tabs (Atorvastatin calcium) .... 1/2 once daily 6)  Oxybutynin Chloride 5 Mg Tb24 (Oxybutynin chloride) .Marland Kitchen.. 1 by mouth qd   Patient Instructions: 1)  Please schedule a follow-up appointment in 6 months.    Prescriptions: TIKOSYN 500 MCG  CAPS (DOFETILIDE) two times a day  #60 x 12   Entered and Authorized by:   Tresa Garter MD   Signed by:   Tresa Garter MD on 04/13/2007   Method used:   Print then Give to Patient   RxID:   [id number] TIKOSYN 500 MCG  CAPS (DOFETILIDE) two times a day  #60 x 12   Entered and Authorized by:   Tresa Garter MD   Signed by:   Tresa Garter MD on 04/13/2007   Method used:   Electronically sent to ...       [facility]  [address]       [address]       [place], [place]  [address]       Ph: [phone] or [phone]       Fax: [phone]   RxID:   [id number]  ]

## 2010-07-24 NOTE — Letter (Signed)
Summary: [facility] - Delinquent Coumadin 2  Coumadin  [address]   [place], [place] [address]   Phone: [phone]  Fax: [phone]     February 01, 2009 MRN: [id number]   DRUSILLA WAMPOLE [address] [place], [place]  [address]   Dear Ms. Tonia Brooms,  We have attempted to contact you by phone and letter on multiple occasions to contact our office for important blood work associated with the blood thinner, warfarin (Coumadin).  Warfarin is a very important drug that can cause life threatening side effects including, bleeding, and thus requires close laboratory monitoring.  We are unable to accept responsibility for blood thinner-related health problems you may develop because you have not followed our recommendations for appropriate monitoring.  These may include abnormal bleeding occurrences and/or development of blood clots (stroke, heart attack, blood clots in legs or lungs, etc.).  We need for you to contact this office at the number listed above to schedule and complete this very important blood work.  Thank you for your assistance in this urgent matter.  Sincerely,  [facility] Team

## 2010-07-24 NOTE — Medication Information (Signed)
Summary: rov/tm   Anticoagulant Therapy  Managed by: Weston Brass, PharmD Referring MD: Dietrich Pates Supervising MD: Eden Emms MD, Theron Arista Indication 1: Atrial Fibrillation (ICD-427.31) Lab Used: [facility] Site: [facility] INR POC 2.9 INR RANGE 2 - 3  Dietary changes: no    Health status changes: no    Bleeding/hemorrhagic complications: no    Recent/future hospitalizations: no    Any changes in medication regimen? yes       Details: took a few days of abx and pain medicine right after surgery  Recent/future dental: no  Any missed doses?: yes     Details: had foot surgery 2 weeks ago.  Was off Coumadin x 1 week.  Has been taking Coumadin for 2 weeks   Is patient compliant with meds? yes       Allergies: No Known Drug Allergies  Anticoagulation Management History:      The patient is taking warfarin and comes in today for a routine follow up visit.  Positive risk factors for bleeding include an age of 71 years or older.  The bleeding index is 'intermediate risk'.  Positive CHADS2 values include History of CHF.  Negative CHADS2 values include Age > 71 years old.  The start date was 12/23/2001.  Her last INR was 1.8.  Anticoagulation responsible provider: Eden Emms MD, Theron Arista.  INR POC: 2.9.  Cuvette Lot#: [id number].  Exp: 04/2011.    Anticoagulation Management Assessment/Plan:      The patient's current anticoagulation dose is Coumadin 5 mg  tabs: Take as directed by coumadin clinic..  The target INR is 2 - 3.  The next INR is due 04/17/2010.  Anticoagulation instructions were given to patient.  Results were reviewed/authorized by Weston Brass, PharmD.  She was notified by Weston Brass PharmD.         Prior Anticoagulation Instructions: INR 2.0 Today 10mg s then resume 5mg s everyday except 7.5mg s on Tuesdays, Thursdays and Sundays. Recheck in 3 weeks. Last dose will be 01/27/10 if surgery is on 02/02/10.   Current Anticoagulation Instructions: INR 2.9  Continue same dose of 1 tablet every  day except 1 1/2 tablets on Sunday, Tuesday and Thursday.  Recheck INR in 4 weeks.

## 2010-07-24 NOTE — Progress Notes (Signed)
Summary: pt requested appt today--refused to schedule later in week  Phone Note Call from Patient   Caller: Patient Call For: CV[facility] Summary of Call: Patient called and stated that she was available to come in for appt today (01/30/2009).  When can she be worked in?   Initial call taken by: Shelby Dubin PharmD, BCPS, CPP,  January 31, 2009 8:57 AM  Follow-up for Phone Call        Patient advised that first available appt is on Tuesday 8/10.  Offered to work with patient for any time/day later this week, but due to volume, unable to accomodate request.  Patient refused to schedule and stated "she would call back when she was able".   Follow-up by: Shelby Dubin, PharmD, 1100 am 01/30/2009

## 2010-07-24 NOTE — Medication Information (Signed)
Summary: rov/sp  Anticoagulant Therapy  Managed by: Bethena Midget, RN, BSN Referring MD: Dietrich Pates Supervising MD: Graciela Husbands MD, Viviann Spare Indication 1: Atrial Fibrillation (ICD-427.31) Lab Used: [facility] Site: [facility] INR POC 3.5 INR RANGE 2 - 3  Dietary changes: yes       Details: Has been eating less leafy veggies  Health status changes: no    Bleeding/hemorrhagic complications: no    Recent/future hospitalizations: no    Any changes in medication regimen? no    Recent/future dental: no  Any missed doses?: no       Is patient compliant with meds? yes       Allergies: No Known Drug Allergies  Anticoagulation Management History:      The patient is taking warfarin and comes in today for a routine follow up visit.  Positive risk factors for bleeding include an age of 71 years or older.  The bleeding index is 'intermediate risk'.  Positive CHADS2 values include History of CHF.  Negative CHADS2 values include Age > 55 years old.  The start date was 12/23/2001.  Her last INR was 1.8.  Anticoagulation responsible provider: Graciela Husbands MD, Viviann Spare.  INR POC: 3.5.  Cuvette Lot#: [id number].  Exp: 04/2011.    Anticoagulation Management Assessment/Plan:      The patient's current anticoagulation dose is Coumadin 5 mg  tabs: Take as directed by coumadin clinic..  The target INR is 2 - 3.  The next INR is due 05/24/2010.  Anticoagulation instructions were given to patient.  Results were reviewed/authorized by Bethena Midget, RN, BSN.  She was notified by Bethena Midget, RN, BSN.         Prior Anticoagulation Instructions: INR 2.9  Continue same dose of 1 tablet every day except 1 1/2 tablets on Sunday, Tuesday and Thursday.  Recheck INR in 4 weeks.   Current Anticoagulation Instructions: INR 3.5 Skip today's dose then resume 5mg s daily except 7.5mg s on Tuesdays, Thursdays and Sundays. Recheck in 3 weeks.

## 2010-07-24 NOTE — Letter (Signed)
Summary: Medical Clearance / [facility]  Medical Clearance / [facility]   Imported By: Lennie Odor 02/20/2010 15:10:43  _____________________________________________________________________  External Attachment:    Type:   Image     Comment:   External Document

## 2010-07-24 NOTE — Medication Information (Signed)
Summary: rov/ewj  Anticoagulant Therapy  Managed by: Shelby Dubin, PharmD, BCPS, CPP Referring MD: Dietrich Pates Supervising MD: Graciela Husbands MD, Viviann Spare Indication 1: Atrial Fibrillation (ICD-427.31) Lab Used: [facility] Site: [facility] INR POC 2.5 INR RANGE 2 - 3  Dietary changes: no    Health status changes: no    Bleeding/hemorrhagic complications: no    Recent/future hospitalizations: no    Any changes in medication regimen? no    Recent/future dental: no  Any missed doses?: no       Is patient compliant with meds? yes       Allergies (verified): No Known Drug Allergies  Anticoagulation Management History:      The patient is taking warfarin and comes in today for a routine follow up visit.  Positive risk factors for bleeding include an age of 16 years or older.  The bleeding index is 'intermediate risk'.  Positive CHADS2 values include History of CHF.  Negative CHADS2 values include Age > 90 years old.  The start date was 12/23/2001.  Her last INR was 1.8.  Anticoagulation responsible provider: Graciela Husbands MD, Viviann Spare.  INR POC: 2.5.  Cuvette Lot#: [id number].  Exp: 09/2010.    Anticoagulation Management Assessment/Plan:      The patient's current anticoagulation dose is Coumadin 5 mg  tabs: Take as directed by coumadin clinic..  The target INR is 2 - 3.  The next INR is due 08/17/2009.  Anticoagulation instructions were given to patient.  Results were reviewed/authorized by Shelby Dubin, PharmD, BCPS, CPP.  She was notified by Ysidro Evert, Pharm D Candidate.         Prior Anticoagulation Instructions: INR 2.2  Continue 1.5 tabs Sundays, Tuesdays, Thursdays. Continue 1 tab on all other days.   Recheck in 1 month.  Contact our office immediately if another physician starts a medication  Current Anticoagulation Instructions: INR: 2.5 Continue with same dosage of 1 tablet every day except 1.5 tablets on Tuesdays, Thursdays and Sundays Recheck in 4 weeks

## 2010-07-24 NOTE — Medication Information (Signed)
Summary: rovp  Anticoagulant Therapy  Managed by: Shelby Dubin, PharmD, BCPS, CPP Referring MD: Dietrich Pates Supervising MD: Gala Romney MD, Reuel Boom Indication 1: Atrial Fibrillation (ICD-427.31) Lab Used: [facility] Site: [facility] INR POC 2.2 INR RANGE 2 - 3  Dietary changes: no    Health status changes: no    Bleeding/hemorrhagic complications: no    Recent/future hospitalizations: no    Any changes in medication regimen? no    Recent/future dental: no  Any missed doses?: no       Is patient compliant with meds? yes      Comments: Patient states she saw the urologist on/around 11/30.  She states that the urologist started her on levaquin 500 mg daily, ciprofloxacin 500 mg two times a day, and nitrofurantoin 100 mg two times a day.  She states that she feels poorly today (increased lethargy, fatigue, and chest discomfort).  Allergies: No Known Drug Allergies  Anticoagulation Management History:      The patient is taking warfarin and comes in today for a routine follow up visit.  Positive risk factors for bleeding include an age of 70 years or older.  The bleeding index is 'intermediate risk'.  Positive CHADS2 values include History of CHF.  Negative CHADS2 values include Age > 30 years old.  The start date was 12/23/2001.  Her last INR was 1.8.  Anticoagulation responsible provider: Maribel Luis MD, Reuel Boom.  INR POC: 2.2.  Cuvette Lot#: [id number].  Exp: 07/2010.    Anticoagulation Management Assessment/Plan:      The patient's current anticoagulation dose is Coumadin 5 mg  tabs: Take as directed by coumadin clinic..  The target INR is 2 - 3.  The next INR is due 07/04/2009.  Anticoagulation instructions were given to patient.  Results were reviewed/authorized by Shelby Dubin, PharmD, BCPS, CPP.  She was notified by Shelby Dubin PharmD, BCPS, CPP.         Prior Anticoagulation Instructions: INR 2.4  Continue on same dosage 1 tablet daily except 1.5 tablets on Sundays, Tuesdays,  and Thursdays.  Recheck in 4 weeks.    Current Anticoagulation Instructions: INR 2.2  Continue 1.5 tabs Sundays, Tuesdays, Thursdays. Continue 1 tab on all other days.   Recheck in 1 month.  Contact our office immediately if another physician starts a medication

## 2010-07-24 NOTE — Medication Information (Signed)
Summary: rov/eac  Anticoagulant Therapy  Managed by: Weston Brass, PharmD Referring MD: Dietrich Pates Supervising MD: Graciela Husbands MD, Viviann Spare Indication 1: Atrial Fibrillation (ICD-427.31) Lab Used: [facility] Site: [facility] INR POC 2.4 INR RANGE 2 - 3  Dietary changes: no    Health status changes: yes       Details: pt went to [facility] for bladder infection.  Picked up abx today but unsure what they are.  Instructed her to call back with the name of the medication  Bleeding/hemorrhagic complications: no    Recent/future hospitalizations: no    Any changes in medication regimen? no    Recent/future dental: no  Any missed doses?: no       Is patient compliant with meds? yes       Allergies: No Known Drug Allergies  Anticoagulation Management History:      The patient is taking warfarin and comes in today for a routine follow up visit.  Positive risk factors for bleeding include an age of 70 years or older.  The bleeding index is 'intermediate risk'.  Positive CHADS2 values include History of CHF.  Negative CHADS2 values include Age > 71 years old old.  The start date was 12/23/2001.  Her last INR was 1.8.  Anticoagulation responsible provider: Graciela Husbands MD, Viviann Spare.  INR POC: 2.4.  Cuvette Lot#: [id number].  Exp: 01/2011.    Anticoagulation Management Assessment/Plan:      The patient's current anticoagulation dose is Coumadin 5 mg  tabs: Take as directed by coumadin clinic..  The target INR is 2 - 3.  The next INR is due 12/05/2009.  Anticoagulation instructions were given to patient.  Results were reviewed/authorized by Weston Brass, PharmD.  She was notified by Weston Brass PharmD.         Prior Anticoagulation Instructions: INR 2.0  Continue on same dosage 1 tablet daily except 1.5 tablets on Tuesdays, Thursdays, and Sundays.  Recheck in 4 weeks.    Current Anticoagulation Instructions: INR 2.4  Continue same dose of 1 tablet every day except 1 1/2 tablet on Sunday, Tuesday and Thursday

## 2010-07-24 NOTE — Letter (Signed)
Summary: [facility] Surgical Clearance   [facility] Surgical Clearance   Imported By: Roderic Ovens 03/16/2010 15:20:26  _____________________________________________________________________  External Attachment:    Type:   Image     Comment:   External Document

## 2010-07-24 NOTE — Medication Information (Signed)
Summary: rov/sp  Anticoagulant Therapy  Managed by: Weston Brass, PharmD Referring MD: Dietrich Pates Supervising MD: Excell Seltzer MD, Casimiro Needle Indication 1: Atrial Fibrillation (ICD-427.31) Lab Used: [facility] Site: [facility] INR POC 2.0 INR RANGE 2 - 3  Dietary changes: no    Health status changes: no    Bleeding/hemorrhagic complications: no    Recent/future hospitalizations: no    Any changes in medication regimen? no    Recent/future dental: no  Any missed doses?: no       Is patient compliant with meds? yes       Allergies: No Known Drug Allergies  Anticoagulation Management History:      The patient is taking warfarin and comes in today for a routine follow up visit.  Positive risk factors for bleeding include an age of 70 years or older.  The bleeding index is 'intermediate risk'.  Positive CHADS2 values include History of CHF.  Negative CHADS2 values include Age > 71 years old.  The start date was 12/23/2001.  Her last INR was 1.8.  Anticoagulation responsible provider: Excell Seltzer MD, Casimiro Needle.  INR POC: 2.0.  Cuvette Lot#: [id number].  Exp: 01/2011.    Anticoagulation Management Assessment/Plan:      The patient's current anticoagulation dose is Coumadin 5 mg  tabs: Take as directed by coumadin clinic..  The target INR is 2 - 3.  The next INR is due 01/03/2010.  Anticoagulation instructions were given to patient.  Results were reviewed/authorized by Weston Brass, PharmD.  She was notified by Weston Brass PharmD.         Prior Anticoagulation Instructions: INR 2.4  Continue same dose of 1 tablet every day except 1 1/2 tablet on Sunday, Tuesday and Thursday   Current Anticoagulation Instructions: INR 2.0  Continue same dose of 1 tablet every day except 1 1/2 tablets on Sunday, Tuesday and Thursday

## 2010-07-24 NOTE — Letter (Signed)
Summary: Appointment - Reminder 2  [facility], Main Office  [address]   [place], [place] [address]   Phone: [phone]  Fax: [phone]     February 02, 2009 MRN: [id number]   BRANIYA FARRUGIA [address] [place], [place]  [address]   Dear Ms. Tonia Brooms,  Our records indicate that you are overdue for a follow-up appointment with Dr. Tenny Craw. It is very important that we reach you to schedule this appointment. We look forward to participating in your health care needs.   Please contact us at the number listed above at your earliest convenience to schedule your appointment.  If you are unable to make an appointment at this time, give [place] a call so we can update our records.  Sincerely,   [facility] Scheduling Team

## 2010-07-24 NOTE — Medication Information (Signed)
Summary: rov/ewj  Anticoagulant Therapy  Managed by: Cloyde Reams, RN, BSN Referring MD: Dietrich Pates Supervising MD: Excell Seltzer MD, Casimiro Needle Indication 1: Atrial Fibrillation (ICD-427.31) Lab Used: [facility] Site: [facility] INR POC 2.5 INR RANGE 2 - 3  Dietary changes: no    Health status changes: no    Bleeding/hemorrhagic complications: no    Recent/future hospitalizations: no    Any changes in medication regimen? no    Recent/future dental: no  Any missed doses?: no       Is patient compliant with meds? yes       Allergies: No Known Drug Allergies  Anticoagulation Management History:      The patient is taking warfarin and comes in today for a routine follow up visit.  Positive risk factors for bleeding include an age of 70 years or older.  The bleeding index is 'intermediate risk'.  Positive CHADS2 values include History of CHF.  Negative CHADS2 values include Age > 71 years old.  The start date was 12/23/2001.  Her last INR was 1.8.  Anticoagulation responsible provider: Excell Seltzer MD, Casimiro Needle.  INR POC: 2.5.  Cuvette Lot#: [id number].  Exp: 03/2011.    Anticoagulation Management Assessment/Plan:      The patient's current anticoagulation dose is Coumadin 5 mg  tabs: Take as directed by coumadin clinic..  The target INR is 2 - 3.  The next INR is due 02/22/2010.  Anticoagulation instructions were given to patient.  Results were reviewed/authorized by Cloyde Reams, RN, BSN.  She was notified by Cloyde Reams RN.         Prior Anticoagulation Instructions: INR 1.9  Take 1.5 tablets today, then resume same dosage 1 tablet daily except 1.5 tablets on Sundays, Tuesdays, and Thursdays.  Recheck in 2 weeks.    Current Anticoagulation Instructions: INR 2.5  Continue on same dosage 1 tablet daily except 1.5 tablets on Sundays, Tuesdays, and Thursdays.   Recheck in 4 weeks.

## 2010-07-24 NOTE — Medication Information (Signed)
Summary: rov-tp  Anticoagulant Therapy  Managed by: Weston Brass, PharmD Referring MD: Dietrich Pates Supervising MD: Graciela Husbands MD, Viviann Spare Indication 1: Atrial Fibrillation (ICD-427.31) Lab Used: [facility] Site: [facility] INR POC 3.2 INR RANGE 2 - 3  Dietary changes: no    Health status changes: no    Bleeding/hemorrhagic complications: no    Recent/future hospitalizations: no    Any changes in medication regimen? no    Recent/future dental: no  Any missed doses?: no       Is patient compliant with meds? yes       Current Medications (verified): 1)  Klor-Con M20 20 Meq  Tbcr (Potassium Chloride Crys Cr) .... 1/2 Once Daily 2)  Coumadin 5 Mg  Tabs (Warfarin Sodium) .Marland Kitchen.. 1 Two Times A Day As Directed By Dr 3)  Coreg 3.125 Mg  Tabs (Carvedilol) .... Two Times A Day 4)  Tikosyn 500 Mcg  Caps (Dofetilide) .... Two Times A Day 5)  Lipitor 20 Mg  Tabs (Atorvastatin Calcium) .... 1/2 Once Daily 6)  Oxybutynin Chloride 5 Mg Tb24 (Oxybutynin Chloride) .Marland Kitchen.. 1 By Mouth Qd  Allergies (verified): No Known Drug Allergies  Anticoagulation Management History:      The patient is taking warfarin and comes in today for a routine follow up visit.  Positive risk factors for bleeding include an age of 71 years or older.  The bleeding index is 'intermediate risk'.  Positive CHADS2 values include History of CHF.  Negative CHADS2 values include Age > 85 years old.  The start date was 12/23/2001.  Her last INR was 1.8.  Anticoagulation responsible provider: Graciela Husbands MD, Viviann Spare.  INR POC: 3.2.  Cuvette Lot#: [id number].  Exp: 04/24/2010.    Anticoagulation Management Assessment/Plan:      The patient's current anticoagulation dose is Coumadin 5 mg  tabs: Take as directed by coumadin clinic..  The target INR is 2 - 3.  The next INR is due 04/20/2009.  Anticoagulation instructions were given to patient.  Results were reviewed/authorized by Weston Brass, PharmD.  She was notified by Demetria Pore, PharmD candidate.          Prior Anticoagulation Instructions: Take two tablets today (10mg ), then resume as below:  Current Anticoagulation Instructions: INR 3.2   Take 1 tablet today (thursday) then resume schedule below on Friday.  Prescriptions: COUMADIN 5 MG  TABS (WARFARIN SODIUM) Take as directed by coumadin clinic. Brand medically necessary #45 x 3   Entered by:   Cloyde Reams RN   Authorized by:   Sherrill Raring, MD, [facility]   Signed by:   Cloyde Reams RN on 03/23/2009   Method used:   Electronically to        [id number] (retail)       [address]       [place], [place]  [address]       Ph: [phone] or [phone]       Fax: [phone]   RxID:   [id number]

## 2010-07-24 NOTE — Medication Information (Signed)
Summary: rov/ez  Anticoagulant Therapy  Managed by: Cloyde Reams, RN, BSN Referring MD: Dietrich Pates Supervising MD: Antoine Poche MD, Fayrene Fearing Indication 1: Atrial Fibrillation (ICD-427.31) Lab Used[facility]  Site: [facility] INR POC 2.0 INR RANGE 2 - 3  Dietary changes: no    Health status changes: no    Bleeding/hemorrhagic complications: no    Recent/future hospitalizations: no    Any changes in medication regimen? no    Recent/future dental: no  Any missed doses?: no       Is patient compliant with meds? yes       Allergies (verified): No Known Drug Allergies  Anticoagulation Management History:      The patient is taking warfarin and comes in today for a routine follow up visit.  Positive risk factors for bleeding include an age of 70 years or older.  The bleeding index is 'intermediate risk'.  Positive CHADS2 values include History of CHF.  Negative CHADS2 values include Age > 28 years old.  The start date was 12/23/2001.  Her last INR was 1.8.  Anticoagulation responsible provider: Antoine Poche MD, Fayrene Fearing.  INR POC: 2.0.  Cuvette Lot#: [id number].  Exp: 09/2010.    Anticoagulation Management Assessment/Plan:      The patient's current anticoagulation dose is Coumadin 5 mg  tabs: Take as directed by coumadin clinic..  The target INR is 2 - 3.  The next INR is due 09/14/2009.  Anticoagulation instructions were given to patient.  Results were reviewed/authorized by Cloyde Reams, RN, BSN.  She was notified by Cloyde Reams RN.         Prior Anticoagulation Instructions: INR: 2.5 Continue with same dosage of 1 tablet every day except 1.5 tablets on Tuesdays, Thursdays and Sundays Recheck in 4 weeks  Current Anticoagulation Instructions: INR 2.0  Continue on same dosage 1 tablet daily except 1.5 tablets on Tuesdays, Thursdays, and Sundays.  Recheck in 4 weeks.

## 2010-07-24 NOTE — Assessment & Plan Note (Signed)
Summary: ROV/DMP  Medications Added * CALICUM daily OMEPRAZOLE 40 MG CPDR (OMEPRAZOLE) 1 tablet every day      Allergies Added: NKDA  Visit Type:  Follow-up  CC:  occ indigestion.  History of Present Illness: Virginia Young is a 70 year old woman with a history of atrial fibrillation, mild LV dysfunction, dyslipidemia. I last saw her in October of last year.  In the interval she is doing fairly well. She denies significant palpitations. Notes no significant chest pain. It is occasionally dizzy, but very infrequent.  patient does complain of some reflux symptoms with a bitter acid taste in her mouth when she lays down at night. Does not occur every night.  Current Medications (verified): 1)  Klor-Con M20 20 Meq  Tbcr (Potassium Chloride Crys Cr) .... 1/2 Once Daily 2)  Coumadin 5 Mg  Tabs (Warfarin Sodium) .... Take As Directed By Coumadin Clinic. 3)  Coreg 3.125 Mg  Tabs (Carvedilol) .... Two Times A Day 4)  Tikosyn 500 Mcg  Caps (Dofetilide) .... Two Times A Day 5)  Lipitor 20 Mg  Tabs (Atorvastatin Calcium) .... 1/2 Once Daily 6)  Oxybutynin Chloride 5 Mg Tb24 (Oxybutynin Chloride) .Marland Kitchen.. 1 By Mouth Qd 7)  Calicum .... Daily  Allergies (verified): No Known Drug Allergies  Past History:  Past Medical History: Last updated: September 28, 2008 CARDIOMYOPATHY (ICD-425.4) MITRAL REGURGITATION, MILD (ICD-396.3) DYSLIPIDEMIA (ICD-272.4) CONGESTIVE HEART FAILURE (ICD-428.0) ATRIAL FIBRILLATION (ICD-427.31) DISEASE, MITRAL VALVE NEC/NOS (ICD-394.9) STASIS ULCER (ICD-454.0) ABSENCE, MENSTRUATION (ICD-626.0) CORONARY ARTERY DISEASE, FAMILY HX (ICD-V17.3) VENOUS INSUFFICIENCY, LEGS (ICD-459.81) DEPRESSION (ICD-311)    Past Surgical History: Last updated: 09-28-08 LIPOMA HX OF REMOVED FROM BACK. (ICD-214.9)  Family History: Last updated: 09/28/2008 Father: Died at age 62 hx of diabets and syncope  Social History: Last updated: 04/13/2007 Retired Divorced Never Smoked Alcohol  use-no  Review of Systems       all systems reviewed. Negative to the above problem except as noted  Vital Signs:  Patient profile:   70 year old female Height:      71 inches Weight:      186 pounds BMI:     26.04 BP sitting:   114 / 75  (left arm) Cuff size:   large  Vitals Entered By: Burnett Kanaris, CNA (April 17, 2009 4:26 PM)  Physical Exam  General:  Well developed, well nourished, in no acute distress. Head:  normocephalic and atraumatic Neck:  JVP normal. No bruits Lungs:  clear to auscultation. No rales, or wheeze Heart:  regular rate and rhythm. S1, S2. No S3. No murmur Abdomen:  supple. No hepatomegaly Extremities:  no edema   EKG  Procedure date:  04/17/2009  Findings:      sinus bradycardia, 56 beats per minute  Impression & Recommendations:  Problem # 1:  ATRIAL FIBRILLATION (ICD-427.31) doing well on it. She gets them. I would continue. Continue on Coumadin Her updated medication list for this problem includes:    Coumadin 5 Mg Tabs (Warfarin sodium) .Marland Kitchen... Take as directed by coumadin clinic.    Coreg 3.125 Mg Tabs (Carvedilol) .Marland Kitchen..Marland Kitchen Two times a day    Tikosyn 500 Mcg Caps (Dofetilide) .Marland Kitchen..Marland Kitchen Two times a day  Orders: EKG w/ Interpretation (93000)  Problem # 2:  CARDIOMYOPATHY (ICD-425.4) mild LV dysfunction on echo. She did not tolerate ACE inhibitors, and even at a low dose she felt very weak. I would keep her on the same regimen. Her updated medication list for this problem includes:    Coumadin 5 Mg  Tabs (Warfarin sodium) .Marland Kitchen... Take as directed by coumadin clinic.    Coreg 3.125 Mg Tabs (Carvedilol) .Marland Kitchen..Marland Kitchen Two times a day    Tikosyn 500 Mcg Caps (Dofetilide) .Marland Kitchen..Marland Kitchen Two times a day  Problem # 3:  DYSLIPIDEMIA (ICD-272.4) Will check fasting lipids also check a B. metastatic time Her updated medication list for this problem includes:    Lipitor 20 Mg Tabs (Atorvastatin calcium) .Marland Kitchen... 1/2 once daily  Problem # 4:  GERD (ICD-530.81) symptoms  consistent with reflux. She feels an acid taste going up her mouth. I will give her prescription for omeprazole. Discussed diet. Her updated medication list for this problem includes:    Omeprazole 40 Mg Cpdr (Omeprazole) .Marland Kitchen... 1 tablet every day  Patient Instructions: 1)  Your physician recommends that you return for a FASTING lipid profile: and also bmet tsh and ast on 10/28 2)  Your physician wants you to follow-up in: 12 months  You will receive a reminder letter in the mail two months in advance. If you don't receive a letter, please call our office to schedule the follow-up appointment. Prescriptions: LIPITOR 20 MG  TABS (ATORVASTATIN CALCIUM) 1/2 once daily  #30 x 11   Entered by:   Layne Benton, RN, BSN   Authorized by:   Sherrill Raring, MD, [facility]   Signed by:   Layne Benton, RN, BSN on 04/17/2009   Method used:   Electronically to        [id number] (retail)       [address]       [place], [place]  [address]       Ph: [phone] or [phone]       Fax: [phone]   RxID:   [id number] TIKOSYN 500 MCG  CAPS (DOFETILIDE) two times a day  #60 x 11   Entered by:   Layne Benton, RN, BSN   Authorized by:   Sherrill Raring, MD, [facility]   Signed by:   Layne Benton, RN, BSN on 04/17/2009   Method used:   Electronically to        [id number] (retail)       [address]       [place], [place]  [address]       Ph: [phone] or [phone]       Fax: [phone]   RxID:   [id number] COREG 3.125 MG  TABS (CARVEDILOL) two times a day  #60 x 11   Entered by:   Layne Benton, RN, BSN   Authorized by:   Sherrill Raring, MD, [facility]   Signed by:   Layne Benton, RN, BSN on 04/17/2009   Method used:   Electronically to        [id number] (retail)       [address]       [place], [place]  [address]       Ph: [phone] or  [phone]       Fax: [phone]   RxID:   [id number] OMEPRAZOLE 40 MG CPDR (OMEPRAZOLE) 1 tablet every day  #30 x 11   Entered by:   Layne Benton, RN, BSN   Authorized by:   Sherrill Raring, MD, [facility]   Signed by:   Layne Benton, RN, BSN on 04/17/2009  Method used:   Electronically to        [id number] (retail)       [address]       [place], [place]  [address]       Ph: [phone] or [phone]       Fax: [phone]   RxID:   [id number]

## 2010-07-24 NOTE — Progress Notes (Signed)
Summary: COUMADIN  Phone Note From Pharmacy   Summary of Call: Pharm needs exact directions for coumadin for insurance reasons. Initial call taken by: Lamar Sprinkles,  Nov 10, 2007 11:08 AM  Follow-up for Phone Call        PLease, ask the pt Follow-up by: Tresa Garter MD,  Nov 10, 2007 1:22 PM    New/Updated Medications: COUMADIN 5 MG  TABS (WARFARIN SODIUM) 1 two times a day as directed by dr [BMN]   Prescriptions: COUMADIN 5 MG  TABS (WARFARIN SODIUM) 1 two times a day as directed by dr Charlsie Quest medically necessary #60 x 6   Entered by:   Lamar Sprinkles   Authorized by:   Tresa Garter MD   Signed by:   Lamar Sprinkles on 11/10/2007   Method used:   Electronically sent to ...       [facility]  [id number]       [address]       [place], [place]  [address]       Ph: [phone] or [phone]       Fax: [phone]   RxID:   [id number]

## 2010-07-24 NOTE — Consult Note (Signed)
Summary: [facility]  [facility]   Imported By: Sherian Rein 02/20/2010 13:52:17  _____________________________________________________________________  External Attachment:    Type:   Image     Comment:   External Document

## 2010-07-24 NOTE — Assessment & Plan Note (Signed)
Summary: per flag/stacey ov---phone-last appt w/dr avp: 2008--stc   Vital Signs:  Patient profile:   70 year old female Height:      71 inches Weight:      175 pounds BMI:     24.50 O2 Sat:      94 % on Room air Temp:     98.0 degrees F oral Pulse rate:   92 / minute Pulse rhythm:   regular Resp:     16 per minute BP sitting:   100 / 78  (left arm) Cuff size:   regular  Vitals Entered By: Lanier Prude, CMA(AAMA) (January 18, 2010 9:33 AM)  O2 Flow:  Room air CC: f/u  Is Patient Diabetic? No Comments Needs Rx for Clotrimazole/Betamethasone   CC:  f/u .  History of Present Illness: C/o B knee pain C/o R big toe pain F/u anticoag and A fib C/o vag pain  Current Medications (verified): 1)  Klor-Con M20 20 Meq  Tbcr (Potassium Chloride Crys Cr) .... 1/2 Once Daily 2)  Coumadin 5 Mg  Tabs (Warfarin Sodium) .... Take As Directed By Coumadin Clinic. 3)  Coreg 3.125 Mg  Tabs (Carvedilol) .... Two Times A Day 4)  Tikosyn 500 Mcg  Caps (Dofetilide) .... Two Times A Day 5)  Lipitor 20 Mg  Tabs (Atorvastatin Calcium) .... 1/2 Once Daily 6)  Oxybutynin Chloride 5 Mg Tb24 (Oxybutynin Chloride) .Marland Kitchen.. 1 By Mouth Qd 7)  Calicum .... Daily 8)  Omeprazole 40 Mg Cpdr (Omeprazole) .Marland Kitchen.. 1 Tablet Every Day 9)  Toviaz 8 Mg Xr24h-Tab (Fesoterodine Fumarate) .Marland Kitchen.. 1 By Mouth Daily.  Allergies (verified): No Known Drug Allergies  Past History:  Past Medical History: CARDIOMYOPATHY (ICD-425.4) MITRAL REGURGITATION, MILD (ICD-396.3) DYSLIPIDEMIA (ICD-272.4) CONGESTIVE HEART FAILURE (ICD-428.0) ATRIAL FIBRILLATION (ICD-427.31) DISEASE, MITRAL VALVE NEC/NOS (ICD-394.9) STASIS ULCER (ICD-454.0) ABSENCE, MENSTRUATION (ICD-626.0) CORONARY ARTERY DISEASE, FAMILY HX (ICD-V17.3) VENOUS INSUFFICIENCY, LEGS (ICD-459.81) DEPRESSION (ICD-311) Gyn Dr Layne Benton Urol Dr McDearmid   Osteoarthritis  Physical Exam  General:  Well developed, well nourished, in no acute distress. Mouth:  Oral mucosa  and oropharynx without lesions or exudates.  Teeth in good repair. Neck:  JVP normal. No bruits Lungs:  clear to auscultation. No rales, or wheeze Heart:  regular rate and rhythm. S1, S2. No S3. No murmur Abdomen:  supple. No hepatomegaly Msk:  B knees w/crepitus R big toe deformed Neurologic:  No cranial nerve deficits noted. Station and gait are normal. Plantar reflexes are down-going bilaterally. DTRs are symmetrical throughout. Sensory, motor and coordinative functions appear intact. Skin:  Hyperpigmented lower legs Psych:  Cognition and judgment appear intact. Alert and cooperative with normal attention span and concentration. No apparent delusions, illusions, hallucinations   Impression & Recommendations:  Problem # 1:  FOOT PAIN (ICD-729.5) R Assessment Deteriorated  Podiatry consult  Orders: Podiatry Referral (Podiatry)  Problem # 2:  COUMADIN THERAPY (ICD-V58.61) Assessment: Unchanged  Problem # 3:  VAGINITIS (ICD-616.10) Assessment: Deteriorated She will try Premarin cream  Problem # 4:  CONGESTIVE HEART FAILURE (ICD-428.0) Assessment: Improved  Her updated medication list for this problem includes:    Coumadin 5 Mg Tabs (Warfarin sodium) .Marland Kitchen... Take as directed by coumadin clinic.    Coreg 3.125 Mg Tabs (Carvedilol) .Marland Kitchen..Marland Kitchen Two times a day  Problem # 5:  DEPRESSION (ICD-311) Assessment: Improved  Problem # 6:  KNEE PAIN (ICD-719.46) B OA  Her updated medication list for this problem includes:    Tramadol Hcl 50 Mg Tabs (Tramadol hcl) .Marland Kitchen... 1-2 tabs  by mouth two times a day as needed pain  Complete Medication List: 1)  Klor-con M20 20 Meq Tbcr (Potassium chloride crys cr) .... 1/2 once daily 2)  Coumadin 5 Mg Tabs (Warfarin sodium) .... Take as directed by coumadin clinic. 3)  Coreg 3.125 Mg Tabs (Carvedilol) .... Two times a day 4)  Tikosyn 500 Mcg Caps (Dofetilide) .... Two times a day 5)  Lipitor 20 Mg Tabs (Atorvastatin calcium) .... 1/2 once daily 6)   Oxybutynin Chloride 5 Mg Tb24 (Oxybutynin chloride) .Marland Kitchen.. 1 by mouth qd 7)  Calicum  .... Daily 8)  Omeprazole 40 Mg Cpdr (Omeprazole) .Marland Kitchen.. 1 tablet every day 9)  Toviaz 8 Mg Xr24h-tab (Fesoterodine fumarate) .Marland Kitchen.. 1 by mouth daily. 10)  Premarin 0.625 Mg/gm Crea (Estrogens, conjugated) .... Use 1 g pv at bedtime x 3 d, then every other day x 6 d, then weekly 11)  Trimo-san 0.025 % Gel (Oxyquinolone sulfate) .... Use once daily pv 12)  Tramadol Hcl 50 Mg Tabs (Tramadol hcl) .Marland Kitchen.. 1-2 tabs by mouth two times a day as needed pain  Patient Instructions: 1)  Please schedule a follow-up appointment in 3 months well w/labs and vit B12 782.0. Prescriptions: TRAMADOL HCL 50 MG TABS (TRAMADOL HCL) 1-2 tabs by mouth two times a day as needed pain  #120 x 3   Entered and Authorized by:   Tresa Garter MD   Signed by:   Tresa Garter MD on 01/18/2010   Method used:   Print then Give to Patient   RxID:   [id number] TRIMO-SAN 0.025 % GEL (OXYQUINOLONE SULFATE) use once daily pv  #4 oz x 6   Entered and Authorized by:   Tresa Garter MD   Signed by:   Tresa Garter MD on 01/18/2010   Method used:   Print then Give to Patient   RxID:   [id number] PREMARIN 0.625 MG/GM CREA (ESTROGENS, CONJUGATED) Use 1 g pv at bedtime x 3 d, then every other day x 6 d, then weekly  #45 g x 3   Entered and Authorized by:   Tresa Garter MD   Signed by:   Tresa Garter MD on 01/18/2010   Method used:   Print then Give to Patient   RxID:   [id number]

## 2010-07-24 NOTE — Medication Information (Signed)
Summary: Coumadin Clinic   Anticoagulant Therapy  Managed by: Shelby Dubin, PharmD, BCPS, CPP Referring MD: Dietrich Pates Indication 1: Atrial Fibrillation (ICD-427.31) Lab Used: [facility] Site: [facility] INR RANGE 2 - 3          Comments: flag sent to Dr. Posey Rea that pt is past due for warfarin follow-up.  Letter will be mailed to pt this week...mp  Allergies: No Known Drug Allergies  Anticoagulation Management History:      Positive risk factors for bleeding include an age of 71 years or older.  The bleeding index is 'intermediate risk'.  Positive CHADS2 values include History of CHF.  Negative CHADS2 values include Age > 71 years old.  The start date was 12/23/2001.    Anticoagulation Management Assessment/Plan:      The patient's current anticoagulation dose is Coumadin 5 mg  tabs: 1 two times a day as directed by dr.  The target INR is 2 - 3.  Results were reviewed/authorized by Shelby Dubin, PharmD, BCPS, CPP.         Prior Anticoagulation Instructions: 5MG  QD/7.5MG  T,T,SU

## 2010-07-24 NOTE — Medication Information (Signed)
Summary: rov/ewj  Anticoagulant Therapy  Managed by: tom pickering Referring MD: Dietrich Pates Supervising MD: Excell Seltzer MD, Casimiro Needle Indication 1: Atrial Fibrillation (ICD-427.31) Lab Used: [facility] Site: [facility] INR POC 1.8 INR RANGE 2 - 3  Dietary changes: no    Health status changes: no    Bleeding/hemorrhagic complications: no    Recent/future hospitalizations: no    Any changes in medication regimen? no    Recent/future dental: no  Any missed doses?: no       Is patient compliant with meds? yes       Current Medications (verified): 1)  Klor-Con M20 20 Meq  Tbcr (Potassium Chloride Crys Cr) .... 1/2 Once Daily 2)  Coumadin 5 Mg  Tabs (Warfarin Sodium) .Marland Kitchen.. 1 Two Times A Day As Directed By Dr 3)  Coreg 3.125 Mg  Tabs (Carvedilol) .... Two Times A Day 4)  Tikosyn 500 Mcg  Caps (Dofetilide) .... Two Times A Day 5)  Lipitor 20 Mg  Tabs (Atorvastatin Calcium) .... 1/2 Once Daily 6)  Oxybutynin Chloride 5 Mg Tb24 (Oxybutynin Chloride) .Marland Kitchen.. 1 By Mouth Qd  Allergies (verified): No Known Drug Allergies  Anticoagulation Management History:      Positive risk factors for bleeding include an age of 71 years or older.  The bleeding index is 'intermediate risk'.  Positive CHADS2 values include History of CHF.  Negative CHADS2 values include Age > 51 years old.  The start date was 12/23/2001.  Today's INR is 1.8.  Anticoagulation responsible provider: Excell Seltzer MD, Casimiro Needle.  INR POC: 1.8.  Cuvette Lot#: [id number].  Exp: 03/24/2010.    Anticoagulation Management Assessment/Plan:      The patient's current anticoagulation dose is Coumadin 5 mg  tabs: 1 two times a day as directed by dr.  The target INR is 2 - 3.  The next INR is due 03/09/2009.  Anticoagulation instructions were given to patient.  Results were reviewed/authorized by tom pickering.  She was notified by Charolotte Eke, PharmD.         Prior Anticoagulation Instructions: 5MG  QD/7.5MG  T,T,SU  Current Anticoagulation  Instructions: Take two tablets today (10mg ), then resume as below:

## 2010-07-24 NOTE — Letter (Signed)
Summary: Handout Printed  Printed Handout:  - Coumadin Instructions-w/out Meds 

## 2010-07-24 NOTE — Medication Information (Signed)
Summary: rov/hmm  Anticoagulant Therapy  Managed by: Cloyde Reams, RN, BSN Referring MD: Dietrich Pates Supervising MD: Eden Emms MD, Theron Arista Indication 1: Atrial Fibrillation (ICD-427.31) Lab Used: [facility] Site: [facility] INR POC 2.4 INR RANGE 2 - 3  Dietary changes: no    Health status changes: no    Bleeding/hemorrhagic complications: no    Recent/future hospitalizations: no    Any changes in medication regimen? no    Recent/future dental: no  Any missed doses?: no       Is patient compliant with meds? yes       Current Medications (verified): 1)  Klor-Con M20 20 Meq  Tbcr (Potassium Chloride Crys Cr) .... 1/2 Once Daily 2)  Coumadin 5 Mg  Tabs (Warfarin Sodium) .... Take As Directed By Coumadin Clinic. 3)  Coreg 3.125 Mg  Tabs (Carvedilol) .... Two Times A Day 4)  Tikosyn 500 Mcg  Caps (Dofetilide) .... Two Times A Day 5)  Lipitor 20 Mg  Tabs (Atorvastatin Calcium) .... 1/2 Once Daily 6)  Oxybutynin Chloride 5 Mg Tb24 (Oxybutynin Chloride) .Marland Kitchen.. 1 By Mouth Qd 7)  Calicum .... Daily 8)  Omeprazole 40 Mg Cpdr (Omeprazole) .Marland Kitchen.. 1 Tablet Every Day  Allergies (verified): No Known Drug Allergies  Anticoagulation Management History:      The patient is taking warfarin and comes in today for a routine follow up visit.  Positive risk factors for bleeding include an age of 71 years or older.  The bleeding index is 'intermediate risk'.  Positive CHADS2 values include History of CHF.  Negative CHADS2 values include Age > 2 years old.  The start date was 12/23/2001.  Her last INR was 1.8.  Anticoagulation responsible provider: Eden Emms MD, Theron Arista.  INR POC: 2.4.  Cuvette Lot#: [id number].  Exp: 04/2010.    Anticoagulation Management Assessment/Plan:      The patient's current anticoagulation dose is Coumadin 5 mg  tabs: Take as directed by coumadin clinic..  The target INR is 2 - 3.  The next INR is due 05/23/2009.  Anticoagulation instructions were given to patient.  Results were  reviewed/authorized by Cloyde Reams, RN, BSN.  She was notified by Cloyde Reams, RN, BSN.         Prior Anticoagulation Instructions: INR 3.2   Take 1 tablet today (thursday) then resume schedule below on Friday.   Current Anticoagulation Instructions: INR 2.4  Continue on same dosage 1 tablet daily except 1.5 tablets on Sundays, Tuesdays, and Thursdays.  Recheck in 4 weeks.

## 2010-07-24 NOTE — Letter (Signed)
Summary: [facility]  Coumadin  [address]   [place], [place] [address]   Phone: [phone]  Fax: [phone]     February 01, 2009 MRN: [id number]   Virginia Young [address] [place], [place]  [address]   Dear [name],  This letter is being sent to you as a reminder that it is necessary for you to get your INR/PT checked regularly so that we can optimize your care.  Our records indicate that you were scheduled to have a test done recently.  As of today, we have not received the results of this test.  It is very important that you have your INR checked.  Please call our office at the number listed above to schedule an appointment at your earliest convenience.    If you have recently had your protime checked or have discontinued this medication, please contact our office at the above phone number to clarify this issue.  Thank you for this prompt attention to this important health care matter.  Sincerely,    [facility]   

## 2010-07-25 DIAGNOSIS — I4891 Unspecified atrial fibrillation: Secondary | ICD-10-CM

## 2010-07-25 DIAGNOSIS — Z7901 Long term (current) use of anticoagulants: Secondary | ICD-10-CM | POA: Insufficient documentation

## 2010-07-26 NOTE — Medication Information (Signed)
Summary: ccr   Anticoagulant Therapy  Managed by: Weston Brass, PharmD Referring MD: Dietrich Pates PCP: Georgina Quint Plotnikov MD Supervising MD: Daleen Squibb MD, Maisie Fus Indication 1: Atrial Fibrillation (ICD-427.31) Lab Used: [facility] Site: [facility] INR POC 3.2 INR RANGE 2 - 3  Dietary changes: no    Health status changes: no    Bleeding/hemorrhagic complications: no    Recent/future hospitalizations: no    Any changes in medication regimen? no    Recent/future dental: no  Any missed doses?: no       Is patient compliant with meds? yes       Allergies: No Known Drug Allergies  Anticoagulation Management History:      Positive risk factors for bleeding include an age of 71 years or older.  The bleeding index is 'intermediate risk'.  Positive CHADS2 values include History of CHF.  Negative CHADS2 values include Age > 56 years old.  The start date was 12/23/2001.  Her last INR was 1.8.  Anticoagulation responsible provider: Daleen Squibb MD, Maisie Fus.  INR POC: 3.2.  Cuvette Lot#: [id number].  Exp: 07/2011.    Anticoagulation Management Assessment/Plan:      The patient's current anticoagulation dose is Coumadin 5 mg  tabs: Take as directed by coumadin clinic..  The target INR is 2 - 3.  The next INR is due 08/06/2010.  Anticoagulation instructions were given to patient.  Results were reviewed/authorized by Weston Brass, PharmD.  She was notified by Stephannie Peters, PharmD Candidate .         Prior Anticoagulation Instructions: INR 2.8  Continue same dose of 1 tablet every day except 1 1/2 tablets on Sunday, Tuesday and Thursday.  Recheck INR in 4 weeks.   Current Anticoagulation Instructions: INR 3.2  Coumadin 5 mg tablets - Skip todays dose, then resume taking 1 tablet every day except 1.5 tablets on Sundays, Tuesdays and Thursdays

## 2010-07-26 NOTE — Assessment & Plan Note (Signed)
Summary: yearly/sl   Visit Type:  Follow-up Primary Provider:  Tresa Garter MD  CC:  C/O Fatigue and DOE.  History of Present Illness: [name] is a 70 year old woman with a history of atrial fibrillation, mild LV dysfunction, dyslipidemia. I last saw her in October of last year. since seen she has done OK until just recently.  Over the past couple weeks she has noted some SOB with activity.  Rare PND.  No palpitatons or chest pain.    Current Medications (verified): 1)  Klor-Con M20 20 Meq  Tbcr (Potassium Chloride Crys Cr) .... 1/2 Once Daily 2)  Coumadin 5 Mg  Tabs (Warfarin Sodium) .... Take As Directed By Coumadin Clinic. 3)  Coreg 3.125 Mg  Tabs (Carvedilol) .... Two Times A Day 4)  Tikosyn 500 Mcg  Caps (Dofetilide) .... Two Times A Day 5)  Lipitor 20 Mg  Tabs (Atorvastatin Calcium) .... 1/2 Once Daily 6)  Oxybutynin Chloride 5 Mg Tb24 (Oxybutynin Chloride) .Marland Kitchen.. 1 By Mouth Qd 7)  Omeprazole 40 Mg Cpdr (Omeprazole) .Marland Kitchen.. 1 Tablet Every Day 8)  Premarin 0.625 Mg/gm Crea (Estrogens, Conjugated) .... Use 1 G Pv At Bedtime X 3 D, Then Every Other Day X 6 D, Then Weekly 9)  Vitamin D 2000 Unit Tabs (Cholecalciferol) .Marland Kitchen.. 1 By Mouth Qd 10)  Clotrimazole-Betamethasone 1-0.05 % Crea (Clotrimazole-Betamethasone) .... As Directed 11)  Senokot 8.6 Mg Tabs (Sennosides) .... As Needed 12)  Advil 200 Mg Caps (Ibuprofen) .... As Needed  Allergies (verified): No Known Drug Allergies  Past History:  Family History: Last updated: 2008/09/13 Father: Died at age 83 hx of diabets and syncope  Social History: Last updated: 04/13/2007 Retired Divorced Never Smoked Alcohol use-no  Risk Factors: Smoking Status: never (04/13/2007)  Past medical, surgical, family and social histories (including risk factors) reviewed, and no changes noted (except as noted below).  Past Medical History: Reviewed history from 01/18/2010 and no changes required. CARDIOMYOPATHY (ICD-425.4) MITRAL  REGURGITATION, MILD (ICD-396.3) DYSLIPIDEMIA (ICD-272.4) CONGESTIVE HEART FAILURE (ICD-428.0) ATRIAL FIBRILLATION (ICD-427.31) DISEASE, MITRAL VALVE NEC/NOS (ICD-394.9) STASIS ULCER (ICD-454.0) ABSENCE, MENSTRUATION (ICD-626.0) CORONARY ARTERY DISEASE, FAMILY HX (ICD-V17.3) VENOUS INSUFFICIENCY, LEGS (ICD-459.81) DEPRESSION (ICD-311) Gyn Dr Layne Benton Urol Dr McDearmid   Osteoarthritis  Past Surgical History: Reviewed history from 13-Sep-2008 and no changes required. LIPOMA HX OF REMOVED FROM BACK. (ICD-214.9)  Family History: Reviewed history from 09/13/08 and no changes required. Father: Died at age 51 hx of diabets and syncope  Social History: Reviewed history from 04/13/2007 and no changes required. Retired Divorced Never Smoked Alcohol use-no  Review of Systems       NO f/c, no cough.  Vital Signs:  Patient profile:   70 year old female Height:      71 inches Weight:      178 pounds BMI:     24.92 Pulse rate:   95 / minute BP sitting:   128 / 90  (left arm) Cuff size:   regular  Vitals Entered By: Stanton Kidney, EMT-P (June 04, 2010 4:04 PM)  Physical Exam  Additional Exam:  Patient is in NAD HEENT:  Normocephalic, atraumatic. EOMI, PERRLA.  Neck: JVP is normal. No thyromegaly. No bruits.  Lungs: clear to auscultation. No rales no wheezes.  Heart: Irregular rate and rhythm. Normal S1, S2. No S3.   No significant murmurs. PMI not displaced.  Abdomen:  Supple, nontender. Normal bowel sounds. No masses. No hepatomegaly.  Extremities:   Good distal pulses throughout. No lower extremity edema.  Musculoskeletal :  moving all extremities.  Neuro:   alert and oriented x3.    EKG  Procedure date:  06/04/2010  Findings:      Atrial fibrillation  95 bpm.    Impression & Recommendations:  Problem # 1:  ATRIAL FIBRILLATION (ICD-427.31) Patient is back in atrial fibrillaton.  She does not sens the palpitations but does note DOE.  SHe says she is taking the  Tikosyn as scheduled. I would recommend that she wear a 48 hour holter to see if she is in/out of afib.  Also schedule for an echo .  Will also check a BMET, BNP and TSH  Problem # 2:  CONGESTIVE HEART FAILURE (ICD-428.0) Repeat echo  Problem # 3:  DYSLIPIDEMIA (ICD-272.4) Keep on meds for now. Her updated medication list for this problem includes:    Lipitor 20 Mg Tabs (Atorvastatin calcium) .Marland Kitchen... 1/2 once daily  Other Orders: EKG w/ Interpretation (93000) TLB-BMP (Basic Metabolic Panel-BMET) (80048-METABOL) TLB-BNP (B-Natriuretic Peptide) (83880-BNPR) TLB-TSH (Thyroid Stimulating Hormone) (84443-TSH) Echocardiogram (Echo) Holter Monitor (Holter Monitor)  Patient Instructions: 1)  Your physician recommends that you  have  lab work today 2)  Your physician recommends that you continue on your current medications as directed. Please refer to the Current Medication list given to you today. 3)  Your physician has requested that you have an echocardiogram.  Echocardiography is a painless test that uses sound waves to create images of your heart. It provides your doctor with information about the size and shape of your heart and how well your heart's chambers and valves are working.  This procedure takes approximately one hour. There are no restrictions for this procedure. 4)  Your physician has recommended that you wear a holter monitor.  Holter monitors are medical devices that record the heart's electrical activity. Doctors most often use these monitors to diagnose arrhythmias. Arrhythmias are problems with the speed or rhythm of the heartbeat. The monitor is a small, portable device. You can wear one while you do your normal daily activities. This is usually used to diagnose what is causing palpitations/syncope (passing out).

## 2010-07-26 NOTE — Procedures (Signed)
Summary: Summary Report  Summary Report   Imported By: Erle Crocker 07/11/2010 16:21:12  _____________________________________________________________________  External Attachment:    Type:   Image     Comment:   External Document

## 2010-07-26 NOTE — Progress Notes (Signed)
Summary: echo results  Medications Added AMIODARONE HCL 400 MG TABS (AMIODARONE HCL) 1 tab every day       Phone Note Call from Patient   Caller: Patient Reason for Call: Talk to Nurse, Lab or Test Results Summary of Call: pt calling re echo results. Initial call taken by: Roe Coombs,  July 05, 2010 12:41 PM  Follow-up for Phone Call        I called patient back and spoke with her concerning the results of her holter monitor that showed sinus rhythm and atrial fib with rates from 45 to 158. Patient states that she is still having symptoms of SOB, therefore, Dr.Donjuan Robison would like her to stop taking Tikosyn and after 1 week to start Amiodarone 400mg  ever day and come back for an MD visit on 2/10. She will take her last dose of Tikosyn today and start the Amiodarone on 07/13/2010.   Layne Benton, RN, BSN  July 05, 2010 2:45 PM     New/Updated Medications: AMIODARONE HCL 400 MG TABS (AMIODARONE HCL) 1 tab every day Prescriptions: AMIODARONE HCL 400 MG TABS (AMIODARONE HCL) 1 tab every day  #30 x 3   Entered by:   Layne Benton, RN, BSN   Authorized by:   Sherrill Raring, MD, [facility]   Signed by:   Layne Benton, RN, BSN on 07/05/2010   Method used:   Electronically to        [id number] (retail)       [address]       [place], [place]  [address]       Ph: [phone] or [phone]       Fax: [phone]   RxID:   [id number]

## 2010-07-26 NOTE — Progress Notes (Signed)
Summary: pt rtn your call from friday   Phone Note Call from Patient Call back at Home Phone [phone]   Caller: Patient Reason for Call: Talk to Nurse, Talk to Doctor Summary of Call: pt rtn your call from friday Initial call taken by: Omer Jack,  June 11, 2010 3:37 PM  Follow-up for Phone Call        BUS X1 Scherrie Bateman, LPN  June 11, 2010 4:44 PM  Additional Follow-up for Phone Call Additional follow up Details #1::        Phone Call Completed PT AWARE OF LAB RESULTS. Additional Follow-up by: Scherrie Bateman, LPN,  June 11, 2010 4:50 PM

## 2010-07-26 NOTE — Progress Notes (Signed)
Summary: pt has med question   Phone Note Call from Patient   Caller: Patient [phone] Reason for Call: Talk to Nurse Summary of Call: pt needs you to call her re meds Initial call taken by: Glynda Jaeger,  July 12, 2010 9:14 AM  Follow-up for Phone Call        Called patient back. She states that the print out that her pharmacist gave her states that when initating Amiodarone the patient has to be admittted to the hospital. I advised her that we admit patients for Tikosyn therapy NOT Amiodarone. She will call me back if she has any problems while taking Amiodarone. She has an appointment for follow up with Dr.Trenice Mesa in the beginning of February.  Layne Benton, RN, BSN  July 12, 2010 10:09 AM

## 2010-07-27 ENCOUNTER — Ambulatory Visit (INDEPENDENT_AMBULATORY_CARE_PROVIDER_SITE_OTHER): Payer: Medicare Other | Admitting: Internal Medicine

## 2010-07-27 ENCOUNTER — Encounter: Payer: Self-pay | Admitting: Cardiology

## 2010-07-27 ENCOUNTER — Encounter: Payer: Self-pay | Admitting: Internal Medicine

## 2010-07-27 ENCOUNTER — Other Ambulatory Visit: Payer: Self-pay | Admitting: Internal Medicine

## 2010-07-27 DIAGNOSIS — I509 Heart failure, unspecified: Secondary | ICD-10-CM

## 2010-07-27 DIAGNOSIS — E7800 Pure hypercholesterolemia, unspecified: Secondary | ICD-10-CM

## 2010-07-27 DIAGNOSIS — R0602 Shortness of breath: Secondary | ICD-10-CM

## 2010-07-27 DIAGNOSIS — I4891 Unspecified atrial fibrillation: Secondary | ICD-10-CM

## 2010-07-27 LAB — BRAIN NATRIURETIC PEPTIDE: Pro B Natriuretic peptide (BNP): 170.3 pg/mL — ABNORMAL HIGH (ref 0.0–100.0)

## 2010-07-27 LAB — HEPATIC FUNCTION PANEL
ALT: 36 U/L — ABNORMAL HIGH (ref 0–35)
AST: 24 U/L (ref 0–37)
Albumin: 3.8 g/dL (ref 3.5–5.2)
Alkaline Phosphatase: 111 U/L (ref 39–117)
Bilirubin, Direct: 0.1 mg/dL (ref 0.0–0.3)
Total Bilirubin: 0.5 mg/dL (ref 0.3–1.2)
Total Protein: 6.6 g/dL (ref 6.0–8.3)

## 2010-07-27 LAB — BASIC METABOLIC PANEL
BUN: 19 mg/dL (ref 6–23)
CO2: 31 mEq/L (ref 19–32)
Calcium: 9.1 mg/dL (ref 8.4–10.5)
Chloride: 106 mEq/L (ref 96–112)
Creatinine, Ser: 0.9 mg/dL (ref 0.4–1.2)
GFR: 66.54 mL/min (ref >60.00)
Glucose, Bld: 104 mg/dL — ABNORMAL HIGH (ref 70–99)
Potassium: 4.3 mEq/L (ref 3.5–5.1)
Sodium: 142 mEq/L (ref 135–145)

## 2010-07-27 LAB — PROTIME-INR
INR: 7.3 ratio (ref 0.8–1.0)
Prothrombin Time: 67.1 s (ref 10.2–12.4)

## 2010-07-27 LAB — CONVERTED CEMR LAB
POC INR: 7.3
Prothrombin Time: 67.1 s

## 2010-07-30 ENCOUNTER — Encounter: Payer: Self-pay | Admitting: Cardiology

## 2010-07-30 ENCOUNTER — Encounter (INDEPENDENT_AMBULATORY_CARE_PROVIDER_SITE_OTHER): Payer: Medicare Other

## 2010-07-30 DIAGNOSIS — Z7901 Long term (current) use of anticoagulants: Secondary | ICD-10-CM

## 2010-07-30 DIAGNOSIS — I4891 Unspecified atrial fibrillation: Secondary | ICD-10-CM

## 2010-07-30 LAB — CONVERTED CEMR LAB: POC INR: 3.1

## 2010-08-03 ENCOUNTER — Ambulatory Visit: Payer: Self-pay | Admitting: Internal Medicine

## 2010-08-09 NOTE — Assessment & Plan Note (Addendum)
Summary: AFIB amio started on 07/13/10 needs EKG/JR;appt time 8:45 AM/J...   Vital Signs:  Patient profile:   70 year old female Height:      71 inches Weight:      174 pounds BMI:     24.36 Pulse rate:   70 / minute Resp:     16 per minute BP sitting:   118 / 80  (left arm)  Vitals Entered By: Kem Parkinson (July 27, 2010 8:27 AM)  Primary Provider:  Tresa Garter MD  CC:  sob...pt also complains of alittle chest pain she thinks it may be related to reflux.  History of Present Illness: Patient is a 70 year old with a history of atrial fibrillation and mild LV dysfunction.  I saw her in December.  At that time she was complaining of increased SOB that started not long before clinic visit.  Echo showed mild LV dysfunction.  Holter monitor showed SR and atrial fibrillation with rates as high as 150s I told the patient to stop Tikosyn and try Amiodarone.   Since seen she says her breathing is better.  She denies chest pains.  Notes occasional dizziness but no syncope.  Dizziness not severe.  No palpitaotns.  Current Medications (verified): 1)  Klor-Con M20 20 Meq  Tbcr (Potassium Chloride Crys Cr) .... 1/2 Once Daily 2)  Coumadin 5 Mg  Tabs (Warfarin Sodium) .... Take As Directed By Coumadin Clinic. 3)  Coreg 3.125 Mg  Tabs (Carvedilol) .... Two Times A Day 4)  Lipitor 20 Mg  Tabs (Atorvastatin Calcium) .... 1/2 Once Daily 5)  Oxybutynin Chloride 5 Mg Tb24 (Oxybutynin Chloride) .Marland Kitchen.. 1 By Mouth Qd 6)  Omeprazole 40 Mg Cpdr (Omeprazole) .Marland Kitchen.. 1 Tablet Every Day 7)  Premarin 0.625 Mg/gm Crea (Estrogens, Conjugated) .... Use 1 G Pv At Bedtime X 3 D, Then Every Other Day X 6 D, Then Weekly 8)  Vitamin D 2000 Unit Tabs (Cholecalciferol) .Marland Kitchen.. 1 By Mouth Qd 9)  Clotrimazole-Betamethasone 1-0.05 % Crea (Clotrimazole-Betamethasone) .... As Directed 10)  Senokot 8.6 Mg Tabs (Sennosides) .... As Needed 11)  Advil 200 Mg Caps (Ibuprofen) .... As Needed 12)  Amiodarone Hcl 400 Mg Tabs  (Amiodarone Hcl) .Marland Kitchen.. 1 Tab Every Day  Allergies: No Known Drug Allergies  Past History:  Past medical, surgical, family and social histories (including risk factors) reviewed, and no changes noted (except as noted below).  Past Medical History: Reviewed history from 01/18/2010 and no changes required. CARDIOMYOPATHY (ICD-425.4) MITRAL REGURGITATION, MILD (ICD-396.3) DYSLIPIDEMIA (ICD-272.4) CONGESTIVE HEART FAILURE (ICD-428.0) ATRIAL FIBRILLATION (ICD-427.31) DISEASE, MITRAL VALVE NEC/NOS (ICD-394.9) STASIS ULCER (ICD-454.0) ABSENCE, MENSTRUATION (ICD-626.0) CORONARY ARTERY DISEASE, FAMILY HX (ICD-V17.3) VENOUS INSUFFICIENCY, LEGS (ICD-459.81) DEPRESSION (ICD-311) Gyn Dr Layne Benton Urol Dr McDearmid   Osteoarthritis  Past Surgical History: Reviewed history from 08/30/2008 and no changes required. LIPOMA HX OF REMOVED FROM BACK. (ICD-214.9)  Family History: Reviewed history from 08/30/2008 and no changes required. Father: Died at age 31 hx of diabets and syncope  Social History: Reviewed history from 04/13/2007 and no changes required. Retired Divorced Never Smoked Alcohol use-no  Review of Systems       All systems reviewed.  Neg to the above problem except as noted above.  Physical Exam  Additional Exam:  Patient is in NAD HEENT:  Normocephalic, atraumatic. EOMI, PERRLA.  Neck: JVP is normal. No thyromegaly. No bruits.  Lungs: clear to auscultation. No rales no wheezes.  Heart: Irregular rate and rhythm. Normal S1, S2. No S3.  No significant murmurs  Abdomen:  Supple, nontender. Normal bowel sounds. No masses. No hepatomegaly.  Extremities:   Good distal pulses throughout. No lower extremity edema.  Musculoskeletal :moving all extremities.  Neuro:   alert and oriented x3.    Impression & Recommendations:  Problem # 1:  ATRIAL FIBRILLATION (ICD-427.31) patient is symptomaticlly doing better on Amio.  Rate is probably better overall than before.  I would  keep on current regimen.  Will need load at least.  COnsider repeat cardioversion in future. Her updated medication list for this problem includes:    Coumadin 5 Mg Tabs (Warfarin sodium) .Marland Kitchen... Take as directed by coumadin clinic.    Coreg 3.125 Mg Tabs (Carvedilol) .Marland Kitchen..Marland Kitchen Two times a day    Amiodarone Hcl 400 Mg Tabs (Amiodarone hcl) .Marland Kitchen... 1 tab every day  Orders: TLB-BMP (Basic Metabolic Panel-BMET) (80048-METABOL) TLB-BNP (B-Natriuretic Peptide) (83880-BNPR) TLB-Hepatic/Liver Function Pnl (80076-HEPATIC)  Problem # 2:  PURE HYPERCHOLESTEROLEMIA (ICD-272.0) continue to follow. Her updated medication list for this problem includes:    Lipitor 20 Mg Tabs (Atorvastatin calcium) .Marland Kitchen... 1/2 once daily  Problem # 3:  CONGESTIVE HEART FAILURE (ICD-428.0) Volume looks good  Will review meds. Her updated medication list for this problem includes:    Coumadin 5 Mg Tabs (Warfarin sodium) .Marland Kitchen... Take as directed by coumadin clinic.    Coreg 3.125 Mg Tabs (Carvedilol) .Marland Kitchen..Marland Kitchen Two times a day    Amiodarone Hcl 400 Mg Tabs (Amiodarone hcl) .Marland Kitchen... 1 tab every day  Other Orders: TLB-PT (Protime) (85610-PTP)  Patient Instructions: 1)  Your physician recommends that you have lab today---BMP/BNP/PT/LIVER PROFILE   427.31   272.0 428.0 2)  Your physician recommends that you schedule a follow-up appointment in: 2 months with Dr Tenny Craw.   Orders Added: 1)  TLB-BMP (Basic Metabolic Panel-BMET) [80048-METABOL] 2)  TLB-BNP (B-Natriuretic Peptide) [83880-BNPR] 3)  TLB-PT (Protime) [85610-PTP] 4)  TLB-Hepatic/Liver Function Pnl [80076-HEPATIC]

## 2010-08-09 NOTE — Medication Information (Signed)
Summary: Coumadin Clinic   Anticoagulant Therapy  Managed by: Windell Hummingbird, RN Referring MD: Dietrich Pates PCP: Georgina Quint Plotnikov MD Supervising MD: Jens Som MD, Arlys John Indication 1: Atrial Fibrillation (ICD-427.31) Lab Used: [facility] Site: [facility] INR POC 3.1 INR RANGE 2 - 3  Dietary changes: yes       Details: Eating more broccoli  Health status changes: no    Bleeding/hemorrhagic complications: no    Recent/future hospitalizations: no    Any changes in medication regimen? yes       Details: Started Amiodarone 3 weeks ago  Recent/future dental: no  Any missed doses?: yes     Details: Held coumadin Friday, Saturday, and Sunday  Is patient compliant with meds? yes       Allergies: No Known Drug Allergies  Anticoagulation Management History:      The patient is taking warfarin and comes in today for a routine follow up visit.  Positive risk factors for bleeding include an age of 35 years or older.  The bleeding index is 'intermediate risk'.  Positive CHADS2 values include History of CHF.  Negative CHADS2 values include Age > 62 years old.  The start date was 12/23/2001.  Her last INR was 7.3 ratio.  Anticoagulation responsible provider: Jens Som MD, Arlys John.  INR POC: 3.1.  Cuvette Lot#: [id number].  Exp: 07/2011.    Anticoagulation Management Assessment/Plan:      The patient's current anticoagulation dose is Coumadin 5 mg  tabs: Take as directed by coumadin clinic..  The target INR is 2 - 3.  The next INR is due 08/10/2010.  Anticoagulation instructions were given to patient.  Results were reviewed/authorized by Windell Hummingbird, RN.  She was notified by Windell Hummingbird, RN.         Prior Anticoagulation Instructions: INR 7.3  Attempted to call pt, LMOM to call back for results.  Bethena Midget, RN, BSN  July 27, 2010 2:51 PM  Dr Tenny Craw spoke with pt on Friday evening and instructed her to hold coumadin and come to clinic on Monday(6th). Bethena Midget, RN, BSN  July 30, 2010  10:40 AM   Current Anticoagulation Instructions: INR 3.1 Take 1/2 tablet today. Then, take 1 tablet every day, except take 1/2 tablet on Mondays. Recheck in 10 days.

## 2010-08-09 NOTE — Medication Information (Signed)
Summary: Coumadin Clinic  Anticoagulant Therapy  Managed by: Weston Brass, PharmD Referring MD: Dietrich Pates PCP: Tresa Garter MD Supervising MD: Jens Som MD, Arlys John Indication 1: Atrial Fibrillation (ICD-427.31) Lab Used: [facility] Site: [facility] PT 67.1 INR POC 7.3 INR RANGE 2 - 3           Allergies: No Known Drug Allergies  Anticoagulation Management History:      Her anticoagulation is being managed by telephone today.  Positive risk factors for bleeding include an age of 71 years or older.  The bleeding index is 'intermediate risk'.  Positive CHADS2 values include History of CHF.  Negative CHADS2 values include Age > 54 years old.  The start date was 12/23/2001.  Her last INR was 1.8.  Prothrombin time is 67.1.  Anticoagulation responsible provider: Jens Som MD, Arlys John.  INR POC: 7.3.    Anticoagulation Management Assessment/Plan:      The patient's current anticoagulation dose is Coumadin 5 mg  tabs: Take as directed by coumadin clinic..  The target INR is 2 - 3.  The next INR is due 08/06/2010.  Anticoagulation instructions were given to patient.  Results were reviewed/authorized by Weston Brass, PharmD.  She was notified by Bethena Midget, RN, BSN.         Prior Anticoagulation Instructions: INR 3.2  Coumadin 5 mg tablets - Skip todays dose, then resume taking 1 tablet every day except 1.5 tablets on Sundays, Tuesdays and Thursdays   Current Anticoagulation Instructions: INR 7.3  Attempted to call pt, LMOM to call back for results.  Bethena Midget, RN, BSN  July 27, 2010 2:51 PM  Dr Tenny Craw spoke with pt on Friday evening and instructed her to hold coumadin and come to clinic on Monday(6th). Bethena Midget, RN, BSN  July 30, 2010 10:40 AM

## 2010-08-10 ENCOUNTER — Encounter (INDEPENDENT_AMBULATORY_CARE_PROVIDER_SITE_OTHER): Payer: Medicare Other

## 2010-08-10 ENCOUNTER — Encounter: Payer: Self-pay | Admitting: Cardiology

## 2010-08-10 DIAGNOSIS — Z7901 Long term (current) use of anticoagulants: Secondary | ICD-10-CM

## 2010-08-10 DIAGNOSIS — I4891 Unspecified atrial fibrillation: Secondary | ICD-10-CM

## 2010-08-10 LAB — CONVERTED CEMR LAB: POC INR: 4.3

## 2010-08-15 NOTE — Medication Information (Signed)
Summary: rov/pc   Anticoagulant Therapy  Managed by: Weston Brass, PharmD Referring MD: Dietrich Pates PCP: Georgina Quint Plotnikov MD Supervising MD: Daleen Squibb MD, Maisie Fus Indication 1: Atrial Fibrillation (ICD-427.31) Lab Used: [facility] Site: [facility] INR POC 4.3 INR RANGE 2 - 3  Dietary changes: no    Health status changes: no    Bleeding/hemorrhagic complications: no    Recent/future hospitalizations: no    Any changes in medication regimen? yes       Details: started amiodarone about 1 month ago  Recent/future dental: no  Any missed doses?: no       Is patient compliant with meds? yes      Comments: Patient has been taking 1 tablet everyday.  Should have been taking 1 tablet everyday except 1/2 on Mondays.    Allergies: No Known Drug Allergies  Anticoagulation Management History:      The patient is taking warfarin and comes in today for a routine follow up visit.  Positive risk factors for bleeding include an age of 70 years or older.  The bleeding index is 'intermediate risk'.  Positive CHADS2 values include History of CHF.  Negative CHADS2 values include Age > 42 years old.  The start date was 12/23/2001.  Her last INR was 7.3 ratio.  Anticoagulation responsible provider: Daleen Squibb MD, Maisie Fus.  INR POC: 4.3.  Cuvette Lot#: [id number].  Exp: 06/2011.    Anticoagulation Management Assessment/Plan:      The patient's current anticoagulation dose is Coumadin 5 mg  tabs: Take as directed by coumadin clinic..  The target INR is 2 - 3.  The next INR is due 08/21/2010.  Anticoagulation instructions were given to patient.  Results were reviewed/authorized by Weston Brass, PharmD.  She was notified by Margot Chimes PharmD Candidate.         Prior Anticoagulation Instructions: INR 3.1 Take 1/2 tablet today. Then, take 1 tablet every day, except take 1/2 tablet on Mondays. Recheck in 10 days.  Current Anticoagulation Instructions: INR 4.3  Do not take any Coumadin today or tomorrow.  We  will start your new dose of 1 tablet everyday except Mondays and Wednesdays when you only take 1/2 tablet.    Recheck INR in 10 days.

## 2010-08-21 ENCOUNTER — Other Ambulatory Visit: Payer: Medicare Other

## 2010-08-21 ENCOUNTER — Encounter: Payer: Self-pay | Admitting: Internal Medicine

## 2010-08-21 ENCOUNTER — Other Ambulatory Visit: Payer: Self-pay | Admitting: Internal Medicine

## 2010-08-21 ENCOUNTER — Ambulatory Visit (INDEPENDENT_AMBULATORY_CARE_PROVIDER_SITE_OTHER): Payer: Medicare Other | Admitting: Internal Medicine

## 2010-08-21 DIAGNOSIS — E7800 Pure hypercholesterolemia, unspecified: Secondary | ICD-10-CM

## 2010-08-21 DIAGNOSIS — M79609 Pain in unspecified limb: Secondary | ICD-10-CM

## 2010-08-21 DIAGNOSIS — N318 Other neuromuscular dysfunction of bladder: Secondary | ICD-10-CM

## 2010-08-21 DIAGNOSIS — Z0000 Encounter for general adult medical examination without abnormal findings: Secondary | ICD-10-CM

## 2010-08-21 DIAGNOSIS — M1990 Unspecified osteoarthritis, unspecified site: Secondary | ICD-10-CM

## 2010-08-21 DIAGNOSIS — Z8249 Family history of ischemic heart disease and other diseases of the circulatory system: Secondary | ICD-10-CM

## 2010-08-21 LAB — LIPID PANEL
Cholesterol: 142 mg/dL (ref 0–200)
HDL: 62.7 mg/dL
LDL Cholesterol: 65 mg/dL (ref 0–99)
Total CHOL/HDL Ratio: 2
Triglycerides: 72 mg/dL (ref 0.0–149.0)
VLDL: 14.4 mg/dL (ref 0.0–40.0)

## 2010-08-21 LAB — URINALYSIS, ROUTINE W REFLEX MICROSCOPIC
Bilirubin Urine: NEGATIVE
Ketones, ur: NEGATIVE
Nitrite: POSITIVE
Specific Gravity, Urine: 1.01
Total Protein, Urine: NEGATIVE
Urine Glucose: NEGATIVE
Urobilinogen, UA: 0.2
pH: 6 (ref 5.0–8.0)

## 2010-08-21 LAB — CBC WITH DIFFERENTIAL/PLATELET
Basophils Absolute: 0.1 K/uL (ref 0.0–0.1)
Basophils Relative: 0.8 % (ref 0.0–3.0)
Eosinophils Absolute: 0.4 K/uL (ref 0.0–0.7)
Eosinophils Relative: 4.1 % (ref 0.0–5.0)
HCT: 44.2 % (ref 36.0–46.0)
Hemoglobin: 15 g/dL (ref 12.0–15.0)
Lymphocytes Relative: 22.1 % (ref 12.0–46.0)
Lymphs Abs: 2.2 K/uL (ref 0.7–4.0)
MCHC: 34 g/dL (ref 30.0–36.0)
MCV: 89.2 fl (ref 78.0–100.0)
Monocytes Absolute: 0.7 K/uL (ref 0.1–1.0)
Monocytes Relative: 7.1 % (ref 3.0–12.0)
Neutro Abs: 6.5 K/uL (ref 1.4–7.7)
Neutrophils Relative %: 65.9 % (ref 43.0–77.0)
Platelets: 233 K/uL (ref 150.0–400.0)
RBC: 4.95 Mil/uL (ref 3.87–5.11)
RDW: 15.3 % — ABNORMAL HIGH (ref 11.5–14.6)
WBC: 9.9 K/uL (ref 4.5–10.5)

## 2010-08-21 LAB — HEPATIC FUNCTION PANEL
ALT: 43 U/L — ABNORMAL HIGH (ref 0–35)
AST: 28 U/L (ref 0–37)
Albumin: 4.2 g/dL (ref 3.5–5.2)
Alkaline Phosphatase: 113 U/L (ref 39–117)
Bilirubin, Direct: 0.1 mg/dL (ref 0.0–0.3)
Total Bilirubin: 0.7 mg/dL (ref 0.3–1.2)
Total Protein: 6.8 g/dL (ref 6.0–8.3)

## 2010-08-21 LAB — BASIC METABOLIC PANEL WITH GFR
BUN: 15 mg/dL (ref 6–23)
CO2: 30 meq/L (ref 19–32)
Calcium: 9.4 mg/dL (ref 8.4–10.5)
Chloride: 99 meq/L (ref 96–112)
Creatinine, Ser: 1 mg/dL (ref 0.4–1.2)
GFR: 60.96 mL/min
Glucose, Bld: 94 mg/dL (ref 70–99)
Potassium: 4.8 meq/L (ref 3.5–5.1)
Sodium: 137 meq/L (ref 135–145)

## 2010-08-21 LAB — TSH: TSH: 0.42 u[IU]/mL (ref 0.35–5.50)

## 2010-08-22 ENCOUNTER — Encounter (INDEPENDENT_AMBULATORY_CARE_PROVIDER_SITE_OTHER): Payer: Medicare Other

## 2010-08-22 ENCOUNTER — Encounter: Payer: Self-pay | Admitting: Cardiovascular Disease

## 2010-08-22 DIAGNOSIS — I4891 Unspecified atrial fibrillation: Secondary | ICD-10-CM

## 2010-08-22 DIAGNOSIS — Z7901 Long term (current) use of anticoagulants: Secondary | ICD-10-CM

## 2010-08-22 LAB — CONVERTED CEMR LAB: POC INR: 2.6
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
## 2010-08-30 NOTE — Medication Information (Signed)
Summary: rov/pc   Anticoagulant Therapy  Managed by: Georgina Pillion, PharmD Referring MD: Dietrich Pates PCP: Georgina Quint Plotnikov MD Supervising MD: Excell Seltzer MD, Casimiro Needle Indication 1: Atrial Fibrillation (ICD-427.31) Lab Used: [facility] Site: [facility] INR POC 2.6 INR RANGE 2 - 3  Dietary changes: no    Health status changes: no    Bleeding/hemorrhagic complications: no    Recent/future hospitalizations: no    Any changes in medication regimen? no    Recent/future dental: no  Any missed doses?: yes     Details: Missed 1 dose last week because she felt dizzy  Is patient compliant with meds? yes       Allergies: No Known Drug Allergies  Anticoagulation Management History:      Positive risk factors for bleeding include an age of 39 years or older.  The bleeding index is 'intermediate risk'.  Positive CHADS2 values include History of CHF.  Negative CHADS2 values include Age > 89 years old.  The start date was 12/23/2001.  Her last INR was 7.3 ratio.  Anticoagulation responsible provider: Excell Seltzer MD, Casimiro Needle.  INR POC: 2.6.  Cuvette Lot#: [id number].  Exp: 06/2011.    Anticoagulation Management Assessment/Plan:      The patient's current anticoagulation dose is Coumadin 5 mg  tabs: Take as directed by coumadin clinic..  The target INR is 2 - 3.  The next INR is due 09/06/2010.  Anticoagulation instructions were given to patient.  Results were reviewed/authorized by Georgina Pillion, PharmD.         Prior Anticoagulation Instructions: INR 4.3  Do not take any Coumadin today or tomorrow.  We will start your new dose of 1 tablet everyday except Mondays and Wednesdays when you only take 1/2 tablet.    Recheck INR in 10 days.    Current Anticoagulation Instructions: Continue current regimen of 1 tablet (5 mg) daily EXCEPT for 1/2 tablet (2.5 mg) on Mondays and Wednesdays.  INR 2.6

## 2010-09-06 ENCOUNTER — Encounter (INDEPENDENT_AMBULATORY_CARE_PROVIDER_SITE_OTHER): Payer: Medicare Other

## 2010-09-06 ENCOUNTER — Encounter: Payer: Self-pay | Admitting: Internal Medicine

## 2010-09-06 DIAGNOSIS — Z7901 Long term (current) use of anticoagulants: Secondary | ICD-10-CM

## 2010-09-06 DIAGNOSIS — I4891 Unspecified atrial fibrillation: Secondary | ICD-10-CM

## 2010-09-06 LAB — CONVERTED CEMR LAB: POC INR: 3.5

## 2010-09-11 NOTE — Medication Information (Signed)
Summary: rpv/pv  Anticoagulant Therapy  Managed by: Bethena Midget, RN, BSN Referring MD: Dietrich Pates PCP: Georgina Quint Plotnikov MD Supervising MD: Johney Frame MD, Fayrene Fearing Indication 1: Atrial Fibrillation (ICD-427.31) Lab Used: [facility] Site: [facility] INR POC 3.5 INR RANGE 2 - 3  Dietary changes: no    Health status changes: no    Bleeding/hemorrhagic complications: no    Recent/future hospitalizations: no    Any changes in medication regimen? no    Recent/future dental: no  Any missed doses?: no       Is patient compliant with meds? yes       Allergies: No Known Drug Allergies  Anticoagulation Management History:      The patient is taking warfarin and comes in today for a routine follow up visit.  Positive risk factors for bleeding include an age of 71 years or older.  The bleeding index is 'intermediate risk'.  Positive CHADS2 values include History of CHF.  Negative CHADS2 values include Age > 58 years old.  The start date was 12/23/2001.  Her last INR was 7.3 ratio.  Anticoagulation responsible provider: Pema Thomure MD, Fayrene Fearing.  INR POC: 3.5.  Cuvette Lot#: [id number].  Exp: 08/2011.    Anticoagulation Management Assessment/Plan:      The patient's current anticoagulation dose is Coumadin 5 mg  tabs: Take as directed by coumadin clinic..  The target INR is 2 - 3.  The next INR is due 09/20/2010.  Anticoagulation instructions were given to patient.  Results were reviewed/authorized by Bethena Midget, RN, BSN.  She was notified by Bethena Midget, RN, BSN.         Prior Anticoagulation Instructions: Continue current regimen of 1 tablet (5 mg) daily EXCEPT for 1/2 tablet (2.5 mg) on Mondays and Wednesdays.  INR 2.6  Current Anticoagulation Instructions: INR 3.5 Skip today's dose then change dose to 1 pill everyday except 1/2 pill on Mondays, Wednesdays and Fridays. Recheck in 2 weeks.

## 2010-09-18 ENCOUNTER — Encounter: Payer: Self-pay | Admitting: Internal Medicine

## 2010-09-20 ENCOUNTER — Ambulatory Visit (INDEPENDENT_AMBULATORY_CARE_PROVIDER_SITE_OTHER): Payer: Medicare Other | Admitting: *Deleted

## 2010-09-20 DIAGNOSIS — Z7901 Long term (current) use of anticoagulants: Secondary | ICD-10-CM

## 2010-09-20 DIAGNOSIS — I4891 Unspecified atrial fibrillation: Secondary | ICD-10-CM

## 2010-09-20 LAB — POCT INR: INR: 3.6

## 2010-09-20 NOTE — Patient Instructions (Signed)
INR 3.6 Skip today's dose.  Then begin taking 1/2 tablet (2.5mg ) daily, except take 1 tablet (5mg ) on Thursdays and Saturdays. Recheck in 2 weeks.

## 2010-09-27 ENCOUNTER — Ambulatory Visit (INDEPENDENT_AMBULATORY_CARE_PROVIDER_SITE_OTHER): Payer: Medicare Other | Admitting: Internal Medicine

## 2010-09-27 ENCOUNTER — Encounter: Payer: Self-pay | Admitting: Internal Medicine

## 2010-09-27 DIAGNOSIS — E785 Hyperlipidemia, unspecified: Secondary | ICD-10-CM

## 2010-09-27 DIAGNOSIS — I428 Other cardiomyopathies: Secondary | ICD-10-CM

## 2010-09-27 DIAGNOSIS — I4891 Unspecified atrial fibrillation: Secondary | ICD-10-CM

## 2010-09-27 MED ORDER — ATORVASTATIN CALCIUM 10 MG PO TABS: 10.0000 mg | ORAL_TABLET | Freq: Every day | ORAL | Status: DC

## 2010-09-27 MED ORDER — CARVEDILOL 3.125 MG PO TABS: ORAL_TABLET | ORAL | Status: DC

## 2010-09-27 MED ORDER — AMIODARONE HCL 200 MG PO TABS: 200.0000 mg | ORAL_TABLET | Freq: Every day | ORAL | Status: DC

## 2010-09-27 NOTE — Patient Instructions (Addendum)
Your physician recommends that you schedule a follow-up appointment in: 6  weeks with Dr. Tenny Craw  Your physician has recommended you make the following change in your medication: Decrease Carvedilol 3.125 mg by mouth once daily.

## 2010-09-27 NOTE — Assessment & Plan Note (Signed)
Volume looks good.

## 2010-09-27 NOTE — Progress Notes (Signed)
HPIPatient is a 70 year old with a history of atrial fibrillation and mild LV dysfunction. I saw her in December. At that time she was complaining of increased SOB that started not long before clinic visit. Echo showed mild LV dysfunction. Holter monitor showed SR and atrial fibrillation with rates as high as 150s  I told the patient to stop Tikosyn and try Amiodarone.   When I saw her last she was feeling better.  Less sob.  I attrib that to heart rate control as she was still in afib. Since seen she says she is feeling fatigued, like she has no energy.  Notes occasional dizziness when she is working in yard.  Does note some back pains.  Allergies not on file  Current Outpatient Prescriptions  Medication Sig Dispense Refill  . amiodarone (PACERONE) 400 MG tablet Take 200 mg by mouth daily.       Marland Kitchen atorvastatin (LIPITOR) 20 MG tablet Take 20 mg by mouth daily. Take 1/2 (half) tab qd       . carvedilol (COREG) 3.125 MG tablet Take 3.125 mg by mouth 2 (two) times daily with a meal.        . Cholecalciferol (VITAMIN D3) 2000 UNITS TABS Take 1 tablet by mouth daily.        . clotrimazole-betamethasone (LOTRISONE) cream Apply topically as directed.        Marland Kitchen ibuprofen (ADVIL,MOTRIN) 200 MG tablet Take 200 mg by mouth every 6 (six) hours as needed.       Marland Kitchen omeprazole (PRILOSEC) 40 MG capsule Take 40 mg by mouth daily.        Marland Kitchen oxybutynin (DITROPAN) 5 MG tablet Take 5 mg by mouth daily.        . potassium chloride SA (KLOR-CON M20) 20 MEQ tablet Take 20 mEq by mouth daily. Take 1/2 (half) tab daily       . senna (SENOKOT) 8.6 MG tablet Take 1 tablet by mouth as needed.        . warfarin (COUMADIN) 5 MG tablet Take by mouth as directed.        . conjugated estrogens (PREMARIN) vaginal cream Place 1 g vaginally as directed. Use 1 g pv at qhs x 3 days then qod x 6 days then weekly         Past Medical History  Diagnosis Date  . Cardiomyopathy, dilated, nonischemic   . Mitral valve insufficiency and  aortic valve insufficiency   . Other and unspecified hyperlipidemia   . CHF (congestive heart failure)   . Arrhythmia     atrial fibrillation  . Other and unspecified mitral valve diseases   . Stasis ulcer   . Absence of menstruation   . Family history of ischemic heart disease   . Unspecified venous (peripheral) insufficiency   . Depressive disorder, not elsewhere classified   . Osteoarthritis     Past Surgical History  Procedure Date  . Lipoma excision     h/o removed from back    Family History  Problem Relation Age of Onset  . Diabetes Father 54    diabetes and syncope    History   Social History  . Marital Status: Divorced    Spouse Name: N/A    Number of Children: N/A  . Years of Education: N/A   Occupational History  . retired    Social History Main Topics  . Smoking status: Never Smoker   . Smokeless tobacco: Not on file  . Alcohol  Use: No  . Drug Use: No  . Sexually Active:    Other Topics Concern  . Not on file   Social History Narrative   [employer] RELEASE ON FILE. Daphane Shepherd, April 26, 2010 10:09 AM.    Review of Systems:  All systems reviewed.  They are negative to the above problem except as previously stated.  Vital Signs: BP 130/80  Pulse 58  Ht 5\' 11"  (1.803 m)  Wt 179 lb (81.194 kg)  BMI 24.97 kg/m2  Physical Exam  HEENT:  Normocephalic, atraumatic. EOMI, PERRLA.  Neck: JVP is normal. No thyromegaly. No bruits.  Lungs: clear to auscultation. No rales no wheezes.  Heart: Regular rate and rhythm. Normal S1, S2. No S3.   No significant murmurs. PMI not displaced. Back:  Tender to palpitation  Abdomen:  Supple, nontender. Normal bowel sounds. No masses. No hepatomegaly.  Extremities:   Good distal pulses throughout. No lower extremity edema.  Musculoskeletal :moving all extremities.  Neuro:   alert and oriented x3.  CN II-XII grossly intact.  EKG:  SB 58 bpm.    Assessment and Plan:

## 2010-09-27 NOTE — Assessment & Plan Note (Signed)
Keep on lipitor

## 2010-09-27 NOTE — Assessment & Plan Note (Signed)
Patient is currently in SR>  I am not sure why she is so fatigued.  Exam is good.   HR is a little slow. I would decreased coreg to 1x per day.  Keep on amio.  TSH was normal on last visit.   KEep on coumadin F/U in 6 wks.

## 2010-10-04 ENCOUNTER — Ambulatory Visit (INDEPENDENT_AMBULATORY_CARE_PROVIDER_SITE_OTHER): Payer: Medicare Other | Admitting: *Deleted

## 2010-10-04 DIAGNOSIS — Z7901 Long term (current) use of anticoagulants: Secondary | ICD-10-CM

## 2010-10-04 DIAGNOSIS — I4891 Unspecified atrial fibrillation: Secondary | ICD-10-CM

## 2010-10-04 LAB — POCT INR: INR: 1.6

## 2010-10-18 ENCOUNTER — Ambulatory Visit (INDEPENDENT_AMBULATORY_CARE_PROVIDER_SITE_OTHER): Payer: Medicare Other | Admitting: *Deleted

## 2010-10-18 DIAGNOSIS — I4891 Unspecified atrial fibrillation: Secondary | ICD-10-CM

## 2010-10-18 DIAGNOSIS — Z7901 Long term (current) use of anticoagulants: Secondary | ICD-10-CM

## 2010-10-18 LAB — POCT INR: INR: 2.4

## 2010-11-06 NOTE — Assessment & Plan Note (Signed)
[facility]                            CARDIOLOGY OFFICE NOTE   Virginia Young, Virginia Young                   MRN:          [id number]  DATE:04/01/2008                            DOB:          1940/10/17    IDENTIFICATION:  Virginia Young is a 70 year old woman who I follow in  clinic.  She has a history of atrial fibrillation and is maintained on  Tikosyn and Coumadin.  She also has a history of mild LV dysfunction.  I  saw her last back in January 2009.  At that time, I placed her on  lisinopril, but she did not tolerate.  After a couple of days, she began  complaining of weakness, achiness, in her shoulder.  She stopped and the  symptoms got better and she did not resume.   The patient's last echo was in 2007 with mild LV dysfunction.   On talking to the patient today, she denies palpitations.  Breathing is  okay.  No chest pain.  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   Her current medicines include:  1. Potassium 10 mEq daily.  2. Coumadin as directed.  3. Tikosyn 0.5 b.i.d.  4. Ditropan p.r.n.  5. Lipitor 10 daily.  6. Coreg 3.125 daily.   PHYSICAL EXAMINATION:  GENERAL:  The patient is in no distress at rest.  VITAL SIGNS:  Blood pressure is 95/62, pulse is 74, weight is 184 down  from 198 on last visit.  NECK:  JVP is normal.  No thyromegaly or bruits.  LUNGS:  Clear without rales or wheezes.  CARDIAC:  Regular rate and rhythm, S1 and S2.  No S3.  No significant  murmurs, occasional skip.  ABDOMEN:  Supple, nontender.  No hepatomegaly.  EXTREMITIES:  No edema.   A 12-lead EKG shows sinus rhythm at 74 beats per minute.  PR interval is  normal.  Occasional PAC, occasional PVC.   IMPRESSION:  1. Atrial fibrillation doing well on Tikosyn and Coumadin.  We will      check a BMET today, also getting Coumadin checked.  2. Mild left ventricular dysfunction, did not tolerate an angiotensin-  converting enzyme.  Her blood pressure is marginal.  I will keep      her in the Coreg.  3. Dyslipidemia.  We will have her come in for a fasting lipid panel      in the near future.  When she comes in for a Coumadin check, again      we will check BMET, TSH at that time.  Otherwise, I will set to see      the patient back in about 8 months, sooner if problems develop.   HEALTH CARE MAINTENANCE:  Flu shot today.     Pricilla Riffle, MD, [facility]  Electronically Signed    PVR/MedQ  DD: 04/01/2008  DT: 04/01/2008  Job #: [id number]   cc:   Georgina Quint. Plotnikov, MD

## 2010-11-06 NOTE — Assessment & Plan Note (Signed)
[facility] HEALTHCARE                            CARDIOLOGY OFFICE NOTE   Kathren, Scearce GIANNI FUCHS                   MRN:          [id number]  DATE:07/17/2007                            DOB:          04/02/1941    IDENTIFICATION:  Ms. Medero is a 70 year old woman with a history of  atrial fibrillation, mild LV dysfunction (LVEF of 40-45% by echo in  March 2007) and dyslipidemia.  I last saw her in the cardiology clinic  actually in September 2007.   In the interval, she has been doing okay.  She is in [place].  She is  staying active.  Denies any fluttering.  No dizziness, no chest pain.  Gets a little short of breath when she does things, but notes no change.   CURRENT MEDICATIONS:  1. Potassium 10 mEq daily.  2. Coumadin as directed.  3. Coreg 3.125 b.i.d.  4. Tikosyn 0.5 b.i.d.  5. Oxybutynin 5 mg daily.  6. Lipitor 10.   PHYSICAL EXAMINATION:  GENERAL:  The patient is in no distress.  VITAL SIGNS:  Blood pressure is 128/70, pulse is 80, weight 189 down  from 196 in 2007.  LUNGS:  Clear.  CARDIAC:  Regular rate and rhythm, S1-S2, no S3.  No significant murmur.  ABDOMEN:  Benign.  EXTREMITIES:  No edema.   DIAGNOSTICS:  A 12-lead EKG:  Normal sinus rhythm at 63 beats per  minute.   IMPRESSION:  1. Atrial fibrillation.  Continue on Tikosyn and Coumadin.  2. Left ventricular dysfunction, mild.  Would try a low dose of      lisinopril 2.5.  She did not tolerate the higher dose before.  We      will see if we can have this and the Coreg on board with her LV      dysfunction.  Clinically, she is doing well.  She will get a BMET      on this in 10 days time.  3. Dyslipidemia.  Lipids done in October were good with an LDL of 71,      HDL of 44, follow up periodically.   PLAN:  I will set followup tentatively for 1 years' time.  Keep on low  doses as noted.     Pricilla Riffle, MD, [facility]  Electronically Signed    PVR/MedQ  DD: 07/17/2007  DT:  07/17/2007  Job #: [id number]   cc:   Georgina Quint. Plotnikov, MD

## 2010-11-08 ENCOUNTER — Ambulatory Visit (INDEPENDENT_AMBULATORY_CARE_PROVIDER_SITE_OTHER): Payer: Medicare Other | Admitting: *Deleted

## 2010-11-08 DIAGNOSIS — Z7901 Long term (current) use of anticoagulants: Secondary | ICD-10-CM

## 2010-11-08 DIAGNOSIS — I4891 Unspecified atrial fibrillation: Secondary | ICD-10-CM

## 2010-11-08 LAB — POCT INR: INR: 2.6

## 2010-11-09 NOTE — Assessment & Plan Note (Signed)
[facility] HEALTHCARE                              CARDIOLOGY OFFICE NOTE   Virginia Young, Virginia Young                   MRN:          [id number]  DATE:03/13/2006                            DOB:          1940/08/20    IDENTIFICATION:  Virginia Young is a 70 year old woman, history of AFib, mild  LV dysfunction (last record EF of 40-45%, mild MR by echo in March). She was  last seen in Cardiology clinic back in March.   In the interval, she has done fairly well, denies chest pain. Breathing is  okay. Denies palpitations.   CURRENT MEDICATIONS:  1. Potassium 10 mEq daily.  2. Coumadin as directed.  3. Coreg 3.125 b.i.d.  4. Tikosyn 0.5 b.i.d.  5. Oxybutynin 5 mg daily.  6. Lipitor 10 mg daily.   PHYSICAL EXAMINATION:  The patient is in no distress. Blood pressure is  122/64, pulse is 56 and regular, weight 196, down from 198 in March.  LUNGS:  Clear.  CARDIAC EXAM:  Regular rate and rhythm, S1, S2.  No S3. No significant  murmurs.  ABDOMEN:  Benign.  EXTREMITIES:  Trivial edema.   IMPRESSION:  1. Atrial fibrillation. Remains in sinus. Continue Tikosyn. Again, told      her not to take an extra dose if she forgot one.  2. Left ventricular dysfunction, mild by echo. I would continue on the      current regimen. Note, she had been on an angiotensin-converting enzyme      that was discontinued back in August of last year. Looked like her      blood pressure was running a little low at the time. Would not push at      this time.  3. Dyslipidemia. Last lipid panel was back in March that was excellent.      She is now on 10 mg.      We will need to get a followup.  4. Mitral regurgitation, mild by echo.            ______________________________  Pricilla Riffle, MD, [facility]   PVR/MedQ  DD:  03/19/2006 DT:  03/21/2006 Job #:  [id number]

## 2010-11-09 NOTE — Discharge Summary (Signed)
NAME:  Virginia Young, Virginia Young                      ACCOUNT NO.:  [id number]   MEDICAL RECORD NO.:  [id number]                   PATIENT TYPE:  INP   LOCATION:  3731                                 FACILITY:  [facility]   PHYSICIAN:  Pricilla Riffle, M.D. [facility]             DATE OF BIRTH:  [birth year]   DATE OF ADMISSION:  09/20/2002  DATE OF DISCHARGE:  09/24/2002                           DISCHARGE SUMMARY - REFERRING   BRIEF HISTORY:  This is a 70 year old female with a long history of atrial  fibrillation with a rapid ventricular response.  She has a cardiomyopathy  thought to be nonischemic but then a negative Cardiolite in 2003.  The  patient has had persistent dyspnea with minimal exertion.  She has no  resting shortness of breath, paroxysmal nocturnal dyspnea, or orthopnea.  She has been chronically anticoagulated.  She failed DC cardioversion  attempt and reports that she did not maintain sinus rhythm following this  procedure.  She has not had any chest discomfort, arm pain, neck pain,  nausea, vomiting, or diaphoresis.  She did note frequent palpitations but  has had no presyncope or syncope.  She was seen by Dr. Antoine Poche on September 20, 2002 and admitted for further evaluation.   PAST MEDICAL HISTORY:  Significant for persistent atrial fibrillation and  nonischemic cardiomyopathy.   ALLERGIES:  No known drug allergies.   MEDICATIONS PRIOR TO ADMISSION:  1. Coumadin 5 mg daily expect 7.5 mg Mondays, Wednesdays, and Fridays.  2. Coreg 3.25 mg b.i.d.  3. Lanoxin 0.125 mg daily.   SOCIAL HISTORY:  The patient lives in [place].  She is divorced.  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   FAMILY HISTORY:  Contributory for multiple siblings with early coronary  artery disease and pacemakers.   HOSPITAL COURSE:  As noted this patient was admitted to [facility]  for further evaluation and treatment of dyspnea on exertion  associated with  atrial fibrillation.  A decision was made to admit the patient for Tikosyn  therapy and careful monitoring.   The patient was started on Tikosyn at time of admission and her QT intervals  were followed.  She did convert to normal sinus rhythm shortly after  starting Tikosyn therapy.  Her Coumadin was monitored per pharmacy protocol.  She continued to do well and arrangements were eventually made to discharge  her on September 24, 2002.  Potassium was added to her medications to prevent  hypokalemia.  It was also felt that the patient would need an outpatient  echo in approximately two months to further evaluate cardiomyopathy.   LABORATORY AND ACCESSORY DATA:  On the 31st, hemoglobin was 13, hematocrit  37.7, WBC 7400, platelets 212,000.  On the 2nd, BUN was 13, creatinine 0.9,  potassium 4.1, glucose was 100, and INR on the 2nd was 2.1, on the 1st it  was 1.9.  The digoxin level on the 29th was less than 0.2.   A chest x-ray showed cardiac enlargement, probable changes consistent with  COPD but no acute pulmonary findings.   DISCHARGE MEDICATIONS:  1. Coumadin 5 mg daily except for 7.5 mg Mondays, Wednesdays, and Fridays.  2. K-Dur 20 mEq daily.  3. Coreg 3.125 mg b.i.d.  4. Vasotec 2.5 mg twice daily.  5. Tikosyn 500 mcg twice daily.  6. She was told not to take Lanoxin for now.  7. She was told she could take Tylenol as needed for pain.   ACTIVITY:  To be as tolerated.   DIET:  The patient was told to stay on a low salt, low fat diet.   DISCHARGE INSTRUCTIONS:  She was to have a basic metabolic panel and a pro  time blood test at the [place] office on Monday, April 5th.   FOLLOW UP:  She was to follow up with Dr. Tenny Craw on April 19th at 4:15.  She  was to see Dr. Posey Rea as needed.   PROBLEMS AT TIME OF DISCHARGE:  1. Atrial fibrillation treated with Tikosyn converted to sinus rhythm this     admission.  2. Chronic Coumadin therapy.  3. History of atypical  chest pain with a normal adenosine Cardiolite in     August of 2003.  4. History of dilated cardiomyopathy, ejection fraction of 25-35% with     severe diffuse hypokinesis and moderate mitral regurgitation.  5. Elevated cholesterol levels.  6. Positive family history of coronary artery disease.     Delton See, P.A. [facility]                  Pricilla Riffle, M.D. [facility]    DR/MEDQ  D:  09/24/2002  T:  09/26/2002  Job:  [id number]   cc:   Georgina Quint. Plotnikov, M.D. [facility]

## 2010-11-09 NOTE — H&P (Signed)
NAME:  Virginia Young, Virginia Young                      ACCOUNT NO.:  [id number]   MEDICAL RECORD NO.:  [id number]                   PATIENT TYPE:  INP   LOCATION:  3731                                 FACILITY:  [facility]   PHYSICIAN:  Rollene Rotunda, M.D. [facility]            DATE OF BIRTH:  1940/10/16   DATE OF ADMISSION:  09/20/2002  DATE OF DISCHARGE:                                HISTORY & PHYSICAL   REASON FOR ADMISSION:  The patient is admitted for treatment with Tikosyn  for persistent atrial fibrillation.   HISTORY OF PRESENT ILLNESS:  The patient is a 70 year old white female with  atrial fibrillation and rapid ventricular response.  She has a  cardiomyopathy thought to be nonischemic with a negative Cardiolite in 2003.  She has had persistent dyspnea with minimal exertion such as walking 100  feet on level ground. She has no resting shortness of breath, PND, or  orthopnea. She has been chronically anticoagulated. She failed DC  cardioversion and reports that she did not maintain sinus rhythm at all  following this.  She is denying any chest discomfort, neck discomfort, arm  discomfort, nausea, vomiting, or diaphoresis.  She noted palpations  infrequently and has had no presyncope or syncope.   PAST MEDICAL HISTORY:  1. Persistent atrial fibrillation.  2. Nonischemic cardiomyopathy.   PAST SURGICAL HISTORY:  None.   ALLERGIES:  None.   MEDICATIONS:  1. Coumadin 5 mg daily except 7.5 mg Monday, Wednesday, and Friday.  2. Coreg 3.125 mg b.i.d.  3. Lanoxin 0.125 mg daily.   SOCIAL HISTORY:  The patient lives in [place].  She is divorced.  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   FAMILY HISTORY:  Contributory for multiple siblings with early coronary  disease and pacemakers.   REVIEW OF SYSTEMS:  As stated in HPI.  Positive for gastroesophageal reflux  disease, orthostatic hypotension, mild.  Otherwise as stated in HPI and  negative for other systems.   PHYSICAL EXAMINATION:  GENERAL:  The patient is mildly anxious but in no  distress.  VITAL SIGNS:  Blood pressure 120/90, heart rate 70 and irregularly  irregular.  HEENT:  Eyelids unremarkable.  Pupils are equal, round, and reactive to  light.  Fundi not visualized.  Oral mucosa unremarkable.  NECK:  No jugular venous distention, waveform within normal limits, carotid  upstrokes brisk and symmetric without bruits.  No thyromegaly.  LYMPHATICS:  No cervical, axillary, or inguinal adenopathy.  LUNGS:  Clear to auscultation bilaterally.  BACK:  Without costovertebral angle tenderness.  CHEST: Unremarkable.  HEART:  PMI not displaced laterally.  Positive RV lift. No S3, no murmurs.  ABDOMEN:  Flat, positive bowel sounds, normal in frequency and pitch.  No  bruits, rebound, guarding, midline pulsatile mass, hepatomegaly,  splenomegaly.  SKIN:  No rashes or nodules.  EXTREMITIES:  2+ pulses throughout, no edema.  NEUROLOGIC:  Oriented to person, place, and time.  Cranial nerves II-XII  grossly intact.  Motor grossly intact throughout.   LABORATORY DATA:  EKG pending.   Labs pending.   ASSESSMENT/PLAN:  1. Persistent atrial fibrillation.  The patient has this and is felt to be     symptomatic with this.  It is also felt this might be the reason for her     cardiomyopathy.  She is here to be loaded on Tikosyn.  She will need a     CMET with magnesium, digoxin level, and EKG prior to starting her     preprinted orders.  She has been therapeutic on anticoagulation and will     continue Coumadin.  2. Cardiomyopathy.  Should be started on ACE inhibitor and will continue     Coreg.  Titration will be per Dr. Tenny Craw.  Of note, the patient could be     considered for the heart failure action trial after her medications have     been titrated if she still has Class II symptoms.  3. Hyperlipidemia.  Apparently managed with diet per Dr. Tenny Craw.                                                Rollene Rotunda, M.D. [facility]    JH/MEDQ  D:  09/20/2002  T:  09/20/2002  Job:  [id number]   cc:   Georgina Quint. Plotnikov, M.D. [facility]

## 2010-11-09 NOTE — Consult Note (Signed)
NAME:  Virginia Young, Virginia Young                      ACCOUNT NO.:  [id number]   MEDICAL RECORD NO.:  [id number]                   PATIENT TYPE:  REC   LOCATION:  FOOT                                 FACILITY:  [facility]   PHYSICIAN:  Jonelle Sports. Sevier, M.D.              DATE OF BIRTH:  May 25, 1941   DATE OF CONSULTATION:  08/05/2002  DATE OF DISCHARGE:                                   CONSULTATION   HISTORY:  This 70 year old white female is referred to the courtesy of Dr.  Tenny Craw for assistance with the management of a chronic venous ulceration of  the left lower extremity.   The patient has had venous insufficiency and varicosities for a number of  years and apparently does use compressive stockings.  She has had some  stasis dermatitis, but no prior ulcerations.  Apparently some 4-6 weeks ago,  she noted the development of a small open ulceration in the medial malleolar  area on the left, and began to treat this herself with alcohol, peroxide and  so forth.  This caused an increased in the size and pain of the ulcer, and  she was evaluated by Dr. Carolanne Grumbling who ordered vascular studies presumably  venous, which had not been accomplished because of the cost involved, but  recommended she change to the use of Bactroban.  She has done this without  any compressive wrap, and again has seen no improvement.  There is modest  drainage from the area and considerable pain.  There has never been  symptomatology to suggest a significant secondary infection.   She presents now for evaluation and advice.   PAST MEDICAL HISTORY:  Positive for venous disease as indicated.  Some  degree of peripheral vascular disease and chronic atrial fibrillation.   ALLERGIES:  She has no known medicinal allergies.   MEDICATIONS:  Regular medications include Coreg, Coumadin and Lanoxin.   PHYSICAL EXAMINATION:  GENERAL:  Examination today is limited to the distal  lower extremities.  Both are involved obviously with  chronic venous  insufficiency with fairly ropy superficial varicosities throughout both  distal lower extremities.  There are some stasis dermatitis changes  bilaterally, these being more striking on the left than on the right.  There  are a few scarred areas suggesting probable minor past skin breakthrough  ulcerations which have healed without particular attention.  There is  significant Hallux valgus formation on the right and to a lesser extent on  the left, but no other significant deformity of the feet.  Arterial pulses  are palpable and appear adequate.  Monofilament testing shows that she has  protected sensations throughout both feet.  Skin temperatures are  essentially normal and symmetrical bilaterally.  There is callous formation  on the plantar aspect of the second metatarsal heads and on the  interphalangeal aspect of both halluces bilaterally, and lesser callous  formation at the 5th metatarsal head areas bilaterally.  There  is also  callous interdigitally on the 2nd toe on the right due to significant hallux  valgus.   On the medial inframalleolar area on the left is a linear ulceration  measuring 2 x10 mm and approximately 2 mm in depth with some crusted  accumulations around the ulcer and with some sloughing in its base.   IMPRESSION:  Venous stasis ulceration, left lower extremity.   DISPOSITION:  1. The patient is given instructions regarding general foot care by video     with nursing-physician reinforcement.  2. The ulcer is subjected to minor debridement in which the crusts are     removed, and a limited amount of slough material removed from the surface     of the wound.  As expected, this is painful to the patient, and so it is     not done laboriously.  3. The wound is treated with an application of Panafil, and the extremity is     then covered with a __________  wrap and a Profore dressing.  4. The nurse is instructed to __________  gently the calluses of  the     halluces bilaterally once the patient's wound is covered and not be     subjected to the dust.  5. Followup visit will be to this clinic in one week.                                               Jonelle Sports. Cheryll Cockayne, M.D.    RES/MEDQ  D:  08/05/2002  T:  08/05/2002  Job:  [id number]   cc:   Pricilla Riffle, M.D. [facility]   Aleksei V. Plotnikov, M.D. [facility]

## 2010-11-16 ENCOUNTER — Encounter: Payer: Self-pay | Admitting: Internal Medicine

## 2010-11-16 ENCOUNTER — Ambulatory Visit (INDEPENDENT_AMBULATORY_CARE_PROVIDER_SITE_OTHER): Payer: Medicare Other | Admitting: Internal Medicine

## 2010-11-16 VITALS — BP 143/83 | HR 62 | Resp 16 | Ht 71.0 in | Wt 185.0 lb

## 2010-11-16 DIAGNOSIS — I509 Heart failure, unspecified: Secondary | ICD-10-CM

## 2010-11-16 DIAGNOSIS — I4891 Unspecified atrial fibrillation: Secondary | ICD-10-CM

## 2010-11-16 DIAGNOSIS — E785 Hyperlipidemia, unspecified: Secondary | ICD-10-CM

## 2010-11-16 MED ORDER — LISINOPRIL 5 MG PO TABS: 5.0000 mg | ORAL_TABLET | Freq: Every day | ORAL | Status: DC

## 2010-11-16 NOTE — Patient Instructions (Signed)
Lab work at [facility]. June 4th  Return office visit with Dr.Ross in 3 months

## 2010-11-20 NOTE — Assessment & Plan Note (Signed)
Remains in SR.  I would continue amiodarone.  Keep on coumadin.

## 2010-11-20 NOTE — Progress Notes (Signed)
HPI Patient is a 70 year old with a history of atrial fibrillation and mild LV dysfunction.  I saw her in February.  She has failed Tikosyn therapy and is now on amiodarone. Since I saw her last she denies palpitations.  She is feeling better than she did when she was in atrial fibrillation.  But she still complains of being tired. Notes occasional dizziness.  No chest pains.  Does have L sided breast pain, hurts with palpation of breast.  Did mow the lawn recently.  Question if strained chest. Not on File  Current Outpatient Prescriptions  Medication Sig Dispense Refill  . amiodarone (PACERONE) 200 MG tablet Take 1 tablet (200 mg total) by mouth daily.  30 tablet  6  . atorvastatin (LIPITOR) 10 MG tablet Take 1 tablet (10 mg total) by mouth daily.  30 tablet  6  . Cholecalciferol (VITAMIN D3) 2000 UNITS TABS Take 1 tablet by mouth daily.        . clotrimazole-betamethasone (LOTRISONE) cream Apply topically as directed.        . conjugated estrogens (PREMARIN) vaginal cream Place 1 g vaginally as directed. Use 1 g pv at qhs x 3 days then qod x 6 days then weekly       . ibuprofen (ADVIL,MOTRIN) 200 MG tablet Take 200 mg by mouth every 6 (six) hours as needed.       Marland Kitchen omeprazole (PRILOSEC) 40 MG capsule Take 40 mg by mouth daily.        Marland Kitchen oxybutynin (DITROPAN) 5 MG tablet Take 5 mg by mouth daily.        . potassium chloride SA (KLOR-CON M20) 20 MEQ tablet Take 20 mEq by mouth daily. Take 1/2 (half) tab daily       . senna (SENOKOT) 8.6 MG tablet Take 1 tablet by mouth as needed.        . warfarin (COUMADIN) 5 MG tablet Take by mouth as directed.        . carvedilol (COREG) 3.125 MG tablet Take one by mouth daily  30 tablet  6  . lisinopril (PRINIVIL,ZESTRIL) 5 MG tablet Take 1 tablet (5 mg total) by mouth daily.  30 tablet  11    Past Medical History  Diagnosis Date  . Cardiomyopathy, dilated, nonischemic   . Mitral valve insufficiency and aortic valve insufficiency   . Other and  unspecified hyperlipidemia   . CHF (congestive heart failure)   . Arrhythmia     atrial fibrillation  . Other and unspecified mitral valve diseases   . Stasis ulcer   . Absence of menstruation   . Family history of ischemic heart disease   . Unspecified venous (peripheral) insufficiency   . Depressive disorder, not elsewhere classified   . Osteoarthritis     Past Surgical History  Procedure Date  . Lipoma excision     h/o removed from back    Family History  Problem Relation Age of Onset  . Diabetes Father 23    diabetes and syncope    History   Social History  . Marital Status: Divorced    Spouse Name: N/A    Number of Children: N/A  . Years of Education: N/A   Occupational History  . retired    Social History Main Topics  . Smoking status: Never Smoker   . Smokeless tobacco: Not on file  . Alcohol Use: No  . Drug Use: No  . Sexually Active:    Other Topics Concern  .  Not on file   Social History Narrative   [employer] RELEASE ON FILE. Daphane Shepherd, April 26, 2010 10:09 AM.    Review of Systems:  All systems reviewed.  They are negative to the above problem except as previously stated.  Vital Signs: BP 143/83  Pulse 62  Resp 16  Ht 5\' 11"  (1.803 m)  Wt 185 lb (83.915 kg)  BMI 25.80 kg/m2  Physical Exam  Patient is in NAD   HEENT:  Normocephalic, atraumatic. EOMI, PERRLA.  Neck: JVP is normal. No thyromegaly. No bruits.  Lungs: clear to auscultation. No rales no wheezes.  Heart: Regular rate and rhythm. Normal S1, S2. No S3.   No significant murmurs. PMI not displaced.  Abdomen:  Supple, nontender. Normal bowel sounds. No masses. No hepatomegaly.  Extremities:   Good distal pulses throughout. No lower extremity edema.  Musculoskeletal :moving all extremities.  Neuro:   alert and oriented x3.  CN II-XII grossly intact.  EKG:  NSR.  60 bpm.     Assessment and Plan:

## 2010-11-20 NOTE — Assessment & Plan Note (Signed)
Continue lipitor  ?

## 2010-11-20 NOTE — Assessment & Plan Note (Signed)
Volume status looks good.  I would add lisinopril to regimen   Check BMET in 10 days.

## 2010-11-26 ENCOUNTER — Other Ambulatory Visit (INDEPENDENT_AMBULATORY_CARE_PROVIDER_SITE_OTHER): Payer: Medicare Other

## 2010-11-26 DIAGNOSIS — I4891 Unspecified atrial fibrillation: Secondary | ICD-10-CM

## 2010-11-26 LAB — BASIC METABOLIC PANEL
BUN: 15 mg/dL (ref 6–23)
CO2: 30 mEq/L (ref 19–32)
Calcium: 8.7 mg/dL (ref 8.4–10.5)
Chloride: 105 mEq/L (ref 96–112)
Creatinine, Ser: 1 mg/dL (ref 0.4–1.2)
GFR: 60.91 mL/min (ref >60.00)
Glucose, Bld: 91 mg/dL (ref 70–99)
Potassium: 4.4 mEq/L (ref 3.5–5.1)
Sodium: 141 mEq/L (ref 135–145)

## 2010-12-03 ENCOUNTER — Ambulatory Visit (INDEPENDENT_AMBULATORY_CARE_PROVIDER_SITE_OTHER): Payer: Medicare Other | Admitting: *Deleted

## 2010-12-03 DIAGNOSIS — I4891 Unspecified atrial fibrillation: Secondary | ICD-10-CM

## 2010-12-03 DIAGNOSIS — Z7901 Long term (current) use of anticoagulants: Secondary | ICD-10-CM

## 2010-12-03 LAB — POCT INR: INR: 2.7

## 2010-12-06 ENCOUNTER — Encounter: Payer: Medicare Other | Admitting: *Deleted

## 2010-12-31 ENCOUNTER — Ambulatory Visit (INDEPENDENT_AMBULATORY_CARE_PROVIDER_SITE_OTHER): Payer: Medicare Other | Admitting: *Deleted

## 2010-12-31 DIAGNOSIS — I4891 Unspecified atrial fibrillation: Secondary | ICD-10-CM

## 2010-12-31 DIAGNOSIS — Z7901 Long term (current) use of anticoagulants: Secondary | ICD-10-CM

## 2010-12-31 LAB — POCT INR: INR: 1.8

## 2011-01-28 ENCOUNTER — Ambulatory Visit (INDEPENDENT_AMBULATORY_CARE_PROVIDER_SITE_OTHER): Payer: Medicare Other | Admitting: *Deleted

## 2011-01-28 DIAGNOSIS — Z7901 Long term (current) use of anticoagulants: Secondary | ICD-10-CM

## 2011-01-28 DIAGNOSIS — I4891 Unspecified atrial fibrillation: Secondary | ICD-10-CM

## 2011-01-28 LAB — POCT INR: INR: 2.6

## 2011-02-11 ENCOUNTER — Ambulatory Visit: Payer: Medicare Other | Admitting: Internal Medicine

## 2011-02-19 ENCOUNTER — Ambulatory Visit: Payer: Medicare Other | Admitting: Internal Medicine

## 2011-02-19 ENCOUNTER — Encounter: Payer: Self-pay | Admitting: Internal Medicine

## 2011-02-19 ENCOUNTER — Ambulatory Visit (INDEPENDENT_AMBULATORY_CARE_PROVIDER_SITE_OTHER): Payer: Medicare Other | Admitting: Internal Medicine

## 2011-02-19 DIAGNOSIS — N912 Amenorrhea, unspecified: Secondary | ICD-10-CM

## 2011-02-19 DIAGNOSIS — M1990 Unspecified osteoarthritis, unspecified site: Secondary | ICD-10-CM

## 2011-02-19 DIAGNOSIS — I509 Heart failure, unspecified: Secondary | ICD-10-CM

## 2011-02-19 DIAGNOSIS — F329 Major depressive disorder, single episode, unspecified: Secondary | ICD-10-CM

## 2011-02-19 DIAGNOSIS — Z7901 Long term (current) use of anticoagulants: Secondary | ICD-10-CM

## 2011-02-19 DIAGNOSIS — E785 Hyperlipidemia, unspecified: Secondary | ICD-10-CM

## 2011-02-19 DIAGNOSIS — I4891 Unspecified atrial fibrillation: Secondary | ICD-10-CM

## 2011-02-19 MED ORDER — TRAMADOL HCL 50 MG PO TABS: 50.0000 mg | ORAL_TABLET | Freq: Two times a day (BID) | ORAL | Status: AC | PRN

## 2011-02-19 MED ORDER — OXYBUTYNIN CHLORIDE 5 MG PO TABS: 5.0000 mg | ORAL_TABLET | Freq: Every day | ORAL | Status: DC

## 2011-02-19 NOTE — Assessment & Plan Note (Signed)
On RX 

## 2011-02-19 NOTE — Assessment & Plan Note (Signed)
Cont per [facility]

## 2011-02-19 NOTE — Assessment & Plan Note (Signed)
On Rx 

## 2011-02-19 NOTE — Assessment & Plan Note (Signed)
Worse. Start Tramadol

## 2011-02-19 NOTE — Assessment & Plan Note (Signed)
Decline meds

## 2011-02-19 NOTE — Progress Notes (Signed)
  Subjective:    Patient ID: Virginia Young, female    DOB: 14-Jun-1941, 70 y.o.   MRN: [id number]  HPI  The patient presents for a follow-up of  chronic hypertension, chronic dyslipidemia, A fib and CHF controlled with medicines Dr Tenny Craw stopped a few of her meds.    Review of Systems  Constitutional: Positive for fatigue. Negative for chills, activity change, appetite change and unexpected weight change.  HENT: Negative for congestion, mouth sores and sinus pressure.   Eyes: Negative for visual disturbance.  Respiratory: Positive for shortness of breath. Negative for cough and chest tightness.   Cardiovascular: Positive for leg swelling (chronic).  Gastrointestinal: Negative for nausea and abdominal pain.  Genitourinary: Negative for frequency, difficulty urinating and vaginal pain.  Musculoskeletal: Negative for back pain and gait problem.  Skin: Negative for pallor and rash.  Neurological: Negative for dizziness, tremors, weakness, numbness and headaches.  Psychiatric/Behavioral: Positive for dysphoric mood. Negative for suicidal ideas, confusion and sleep disturbance.       Objective:   Physical Exam  Constitutional: She appears well-developed and well-nourished. No distress.  HENT:  Head: Normocephalic.  Right Ear: External ear normal.  Left Ear: External ear normal.  Nose: Nose normal.  Mouth/Throat: Oropharynx is clear and moist.  Eyes: Conjunctivae are normal. Pupils are equal, round, and reactive to light. Right eye exhibits no discharge. Left eye exhibits no discharge.  Neck: Normal range of motion. Neck supple. No JVD present. No tracheal deviation present. No thyromegaly present.  Cardiovascular:  Murmur heard.      Irreg irreg at times 2/6 m  Pulmonary/Chest: No stridor. No respiratory distress. She has no wheezes.  Abdominal: Soft. Bowel sounds are normal. She exhibits no distension and no mass. There is no tenderness. There is no rebound and no guarding.    Musculoskeletal: She exhibits tenderness (Limp, B knees are tender). Edema: trace to none B LEs   Lymphadenopathy:    She has no cervical adenopathy.  Neurological: She displays normal reflexes. No cranial nerve deficit. She exhibits normal muscle tone. Coordination normal.  Skin: Rash (hyperpigmented LEs) noted. No erythema.  Psychiatric: She has a normal mood and affect. Her behavior is normal. Judgment and thought content normal.          Assessment & Plan:

## 2011-03-01 ENCOUNTER — Ambulatory Visit (INDEPENDENT_AMBULATORY_CARE_PROVIDER_SITE_OTHER): Payer: Medicare Other | Admitting: *Deleted

## 2011-03-01 DIAGNOSIS — Z7901 Long term (current) use of anticoagulants: Secondary | ICD-10-CM

## 2011-03-01 DIAGNOSIS — I4891 Unspecified atrial fibrillation: Secondary | ICD-10-CM

## 2011-03-01 LAB — POCT INR: INR: 2

## 2011-03-26 ENCOUNTER — Ambulatory Visit (INDEPENDENT_AMBULATORY_CARE_PROVIDER_SITE_OTHER): Payer: Medicare Other | Admitting: *Deleted

## 2011-03-26 DIAGNOSIS — I4891 Unspecified atrial fibrillation: Secondary | ICD-10-CM

## 2011-03-26 DIAGNOSIS — Z7901 Long term (current) use of anticoagulants: Secondary | ICD-10-CM

## 2011-03-26 LAB — POCT INR: INR: 3.2

## 2011-03-29 ENCOUNTER — Encounter: Payer: Medicare Other | Admitting: *Deleted

## 2011-04-05 ENCOUNTER — Encounter: Payer: Self-pay | Admitting: Internal Medicine

## 2011-04-19 ENCOUNTER — Other Ambulatory Visit: Payer: Self-pay | Admitting: *Deleted

## 2011-04-19 DIAGNOSIS — I4891 Unspecified atrial fibrillation: Secondary | ICD-10-CM

## 2011-04-19 MED ORDER — AMIODARONE HCL 200 MG PO TABS: 200.0000 mg | ORAL_TABLET | Freq: Every day | ORAL | Status: DC

## 2011-04-23 ENCOUNTER — Ambulatory Visit (INDEPENDENT_AMBULATORY_CARE_PROVIDER_SITE_OTHER): Payer: Medicare Other | Admitting: *Deleted

## 2011-04-23 DIAGNOSIS — I4891 Unspecified atrial fibrillation: Secondary | ICD-10-CM

## 2011-04-23 DIAGNOSIS — Z7901 Long term (current) use of anticoagulants: Secondary | ICD-10-CM

## 2011-04-23 LAB — POCT INR: INR: 3

## 2011-05-05 ENCOUNTER — Other Ambulatory Visit: Payer: Self-pay | Admitting: Internal Medicine

## 2011-05-21 ENCOUNTER — Encounter: Payer: Medicare Other | Admitting: *Deleted

## 2011-05-23 ENCOUNTER — Ambulatory Visit (INDEPENDENT_AMBULATORY_CARE_PROVIDER_SITE_OTHER): Payer: Medicare Other | Admitting: *Deleted

## 2011-05-23 DIAGNOSIS — Z7901 Long term (current) use of anticoagulants: Secondary | ICD-10-CM

## 2011-05-23 DIAGNOSIS — I4891 Unspecified atrial fibrillation: Secondary | ICD-10-CM

## 2011-05-23 LAB — POCT INR: INR: 2.4

## 2011-06-04 ENCOUNTER — Other Ambulatory Visit: Payer: Self-pay | Admitting: *Deleted

## 2011-06-04 MED ORDER — WARFARIN SODIUM 5 MG PO TABS: 5.0000 mg | ORAL_TABLET | ORAL | Status: DC

## 2011-06-20 ENCOUNTER — Encounter: Payer: Medicare Other | Admitting: *Deleted

## 2011-06-21 ENCOUNTER — Ambulatory Visit (INDEPENDENT_AMBULATORY_CARE_PROVIDER_SITE_OTHER): Payer: Medicare Other | Admitting: Internal Medicine

## 2011-06-21 ENCOUNTER — Other Ambulatory Visit (INDEPENDENT_AMBULATORY_CARE_PROVIDER_SITE_OTHER): Payer: Medicare Other

## 2011-06-21 ENCOUNTER — Encounter: Payer: Self-pay | Admitting: Internal Medicine

## 2011-06-21 VITALS — BP 128/70 | HR 72 | Temp 98.2°F | Resp 16 | Wt 187.0 lb

## 2011-06-21 DIAGNOSIS — R202 Paresthesia of skin: Secondary | ICD-10-CM

## 2011-06-21 DIAGNOSIS — M8580 Other specified disorders of bone density and structure, unspecified site: Secondary | ICD-10-CM

## 2011-06-21 DIAGNOSIS — M2550 Pain in unspecified joint: Secondary | ICD-10-CM | POA: Insufficient documentation

## 2011-06-21 DIAGNOSIS — M899 Disorder of bone, unspecified: Secondary | ICD-10-CM

## 2011-06-21 DIAGNOSIS — R413 Other amnesia: Secondary | ICD-10-CM

## 2011-06-21 DIAGNOSIS — R221 Localized swelling, mass and lump, neck: Secondary | ICD-10-CM

## 2011-06-21 DIAGNOSIS — D170 Benign lipomatous neoplasm of skin and subcutaneous tissue of head, face and neck: Secondary | ICD-10-CM

## 2011-06-21 DIAGNOSIS — D1739 Benign lipomatous neoplasm of skin and subcutaneous tissue of other sites: Secondary | ICD-10-CM

## 2011-06-21 DIAGNOSIS — R209 Unspecified disturbances of skin sensation: Secondary | ICD-10-CM

## 2011-06-21 DIAGNOSIS — R220 Localized swelling, mass and lump, head: Secondary | ICD-10-CM

## 2011-06-21 LAB — CBC WITH DIFFERENTIAL/PLATELET
Basophils Absolute: 0.1 10*3/uL (ref 0.0–0.1)
Basophils Relative: 1 % (ref 0.0–3.0)
Eosinophils Absolute: 0.2 10*3/uL (ref 0.0–0.7)
Eosinophils Relative: 2.1 % (ref 0.0–5.0)
HCT: 43.7 % (ref 36.0–46.0)
Hemoglobin: 14.7 g/dL (ref 12.0–15.0)
Lymphocytes Relative: 26.8 % (ref 12.0–46.0)
Lymphs Abs: 2 10*3/uL (ref 0.7–4.0)
MCHC: 33.8 g/dL (ref 30.0–36.0)
MCV: 90.1 fl (ref 78.0–100.0)
Monocytes Absolute: 0.5 10*3/uL (ref 0.1–1.0)
Monocytes Relative: 6.3 % (ref 3.0–12.0)
Neutro Abs: 4.9 10*3/uL (ref 1.4–7.7)
Neutrophils Relative %: 63.8 % (ref 43.0–77.0)
Platelets: 223 10*3/uL (ref 150.0–400.0)
RBC: 4.84 Mil/uL (ref 3.87–5.11)
RDW: 14.9 % — ABNORMAL HIGH (ref 11.5–14.6)
WBC: 7.6 10*3/uL (ref 4.5–10.5)

## 2011-06-21 LAB — BASIC METABOLIC PANEL
BUN: 13 mg/dL (ref 6–23)
CO2: 30 mEq/L (ref 19–32)
Calcium: 9.1 mg/dL (ref 8.4–10.5)
Chloride: 102 mEq/L (ref 96–112)
Creatinine, Ser: 1 mg/dL (ref 0.4–1.2)
GFR: 56.07 mL/min — ABNORMAL LOW (ref >60.00)
Glucose, Bld: 99 mg/dL (ref 70–99)
Potassium: 4.5 mEq/L (ref 3.5–5.1)
Sodium: 138 mEq/L (ref 135–145)

## 2011-06-21 LAB — HEPATIC FUNCTION PANEL
ALT: 24 U/L (ref 0–35)
AST: 19 U/L (ref 0–37)
Albumin: 4.2 g/dL (ref 3.5–5.2)
Alkaline Phosphatase: 120 U/L — ABNORMAL HIGH (ref 39–117)
Bilirubin, Direct: 0.1 mg/dL (ref 0.0–0.3)
Total Bilirubin: 0.6 mg/dL (ref 0.3–1.2)
Total Protein: 7.1 g/dL (ref 6.0–8.3)

## 2011-06-21 LAB — TSH: TSH: 1.23 u[IU]/mL (ref 0.35–5.50)

## 2011-06-21 LAB — CK: Total CK: 96 U/L (ref 7–177)

## 2011-06-21 LAB — SEDIMENTATION RATE: Sed Rate: 10 mm/hr (ref 0–22)

## 2011-06-21 LAB — VITAMIN B12: Vitamin B-12: 666 pg/mL (ref 211–911)

## 2011-06-21 MED ORDER — CLOTRIMAZOLE-BETAMETHASONE 1-0.05 % EX CREA: TOPICAL_CREAM | CUTANEOUS | Status: DC

## 2011-06-21 MED ORDER — OXYBUTYNIN CHLORIDE 5 MG PO TABS: 5.0000 mg | ORAL_TABLET | Freq: Every day | ORAL | Status: DC

## 2011-06-21 MED ORDER — OMEPRAZOLE 40 MG PO CPDR: 40.0000 mg | DELAYED_RELEASE_CAPSULE | Freq: Every day | ORAL | Status: DC

## 2011-06-21 NOTE — Patient Instructions (Signed)
Stop Lipitor 

## 2011-06-21 NOTE — Progress Notes (Signed)
  Subjective:    Patient ID: Virginia Young, female    DOB: 05/16/1941, 70 y.o.   MRN: [id number]  HPI   The patient presents for a follow-up of  chronic hypertension, A fib and chronic dyslipidemia, controlled with medicines.  C/o stiffness and arthralgias x months C/o memory issues; got lost a few times xmonths   Review of Systems  Constitutional: Negative for chills, activity change, appetite change, fatigue and unexpected weight change.  HENT: Negative for congestion, mouth sores and sinus pressure.   Eyes: Negative for visual disturbance.  Respiratory: Negative for cough and chest tightness.   Gastrointestinal: Negative for nausea and abdominal pain.  Genitourinary: Negative for frequency, difficulty urinating and vaginal pain.  Musculoskeletal: Positive for back pain. Negative for gait problem.  Skin: Negative for pallor and rash.  Neurological: Negative for dizziness, tremors, weakness, numbness and headaches.  Psychiatric/Behavioral: Positive for behavioral problems and decreased concentration. Negative for suicidal ideas, confusion and sleep disturbance. The patient is nervous/anxious.        Objective:   Physical Exam  Constitutional: She is oriented to person, place, and time. She appears well-developed and well-nourished. No distress.  HENT:  Head: Normocephalic.  Right Ear: External ear normal.  Left Ear: External ear normal.  Nose: Nose normal.  Mouth/Throat: Oropharynx is clear and moist.  Eyes: Conjunctivae are normal. Pupils are equal, round, and reactive to light. Right eye exhibits no discharge. Left eye exhibits no discharge.  Neck: Normal range of motion. Neck supple. No JVD present. No tracheal deviation present. No thyromegaly present.  Cardiovascular: Normal rate, regular rhythm and normal heart sounds.   Pulmonary/Chest: No stridor. No respiratory distress. She has no wheezes.  Abdominal: Soft. Bowel sounds are normal. She exhibits no distension and  no mass. There is no tenderness. There is no rebound and no guarding.  Musculoskeletal: She exhibits no edema and no tenderness.  Lymphadenopathy:    She has no cervical adenopathy.  Neurological: She is alert and oriented to person, place, and time. She displays normal reflexes. No cranial nerve deficit. She exhibits normal muscle tone. Coordination (mild) abnormal.  Skin: No rash noted. No erythema.  Psychiatric: She has a normal mood and affect. Her behavior is normal. Judgment and thought content normal.  L post nick mobile lump NT Recalls 0/3 Serial "7" x 1 Clock drawing was good  Procedure Note :    Procedure :   Point of care (POC) sonography examination   Indication: posterior neck mass   Equipment used: Sonosite M-Turbo with HFL38x/13-6 MHz transducer linear probe. The images were stored in the unit and later transferred in storage.  The patient was placed in a sitting position.  This study revealed a isoechoic  2.96x2.90x0.57 cm lesion in the post aspect of the neck to the L from midline   Impression: posterior  Neck subcutaneous mass c/w lipoma           Assessment & Plan:

## 2011-06-21 NOTE — Assessment & Plan Note (Signed)
Head CT Labs Hold Lipitor

## 2011-06-21 NOTE — Assessment & Plan Note (Signed)
Hold Lipitor Labs 

## 2011-06-22 LAB — VITAMIN D 25 HYDROXY (VIT D DEFICIENCY, FRACTURES): Vit D, 25-Hydroxy: 40 ng/mL (ref 30–89)

## 2011-06-22 LAB — RHEUMATOID FACTOR: Rhuematoid fact SerPl-aCnc: <10 IU/mL (ref <=14)

## 2011-06-24 ENCOUNTER — Ambulatory Visit (INDEPENDENT_AMBULATORY_CARE_PROVIDER_SITE_OTHER): Payer: Medicare Other | Admitting: *Deleted

## 2011-06-24 ENCOUNTER — Ambulatory Visit (INDEPENDENT_AMBULATORY_CARE_PROVIDER_SITE_OTHER)
Admission: RE | Admit: 2011-06-24 | Discharge: 2011-06-24 | Disposition: A | Discharge status: Home / Self Care | Payer: Medicare Other | Source: Ambulatory Visit | Attending: Internal Medicine | Admitting: Internal Medicine

## 2011-06-24 DIAGNOSIS — Z7901 Long term (current) use of anticoagulants: Secondary | ICD-10-CM

## 2011-06-24 DIAGNOSIS — I4891 Unspecified atrial fibrillation: Secondary | ICD-10-CM

## 2011-06-24 DIAGNOSIS — R413 Other amnesia: Secondary | ICD-10-CM

## 2011-06-24 LAB — POCT INR: INR: 2.1

## 2011-06-26 ENCOUNTER — Telehealth: Payer: Self-pay | Admitting: Internal Medicine

## 2011-06-26 NOTE — Telephone Encounter (Signed)
Misty Stanley, please, inform patient that her head CT was normal except for aging changes Thx

## 2011-06-26 NOTE — Telephone Encounter (Signed)
Pt informed

## 2011-07-18 ENCOUNTER — Ambulatory Visit (INDEPENDENT_AMBULATORY_CARE_PROVIDER_SITE_OTHER): Payer: Medicare Other | Admitting: *Deleted

## 2011-07-18 DIAGNOSIS — Z7901 Long term (current) use of anticoagulants: Secondary | ICD-10-CM

## 2011-07-18 DIAGNOSIS — I4891 Unspecified atrial fibrillation: Secondary | ICD-10-CM

## 2011-07-18 LAB — POCT INR: INR: 1.8

## 2011-07-19 ENCOUNTER — Encounter: Payer: Medicare Other | Admitting: *Deleted

## 2011-07-22 ENCOUNTER — Encounter: Payer: Medicare Other | Admitting: *Deleted

## 2011-08-06 ENCOUNTER — Ambulatory Visit (INDEPENDENT_AMBULATORY_CARE_PROVIDER_SITE_OTHER): Payer: Medicare Other | Admitting: Internal Medicine

## 2011-08-06 ENCOUNTER — Encounter: Payer: Self-pay | Admitting: Internal Medicine

## 2011-08-06 VITALS — BP 122/82 | HR 84 | Temp 98.5°F | Resp 16 | Wt 184.0 lb

## 2011-08-06 DIAGNOSIS — I509 Heart failure, unspecified: Secondary | ICD-10-CM

## 2011-08-06 DIAGNOSIS — E785 Hyperlipidemia, unspecified: Secondary | ICD-10-CM | POA: Diagnosis not present

## 2011-08-06 DIAGNOSIS — R413 Other amnesia: Secondary | ICD-10-CM

## 2011-08-06 DIAGNOSIS — F329 Major depressive disorder, single episode, unspecified: Secondary | ICD-10-CM | POA: Diagnosis not present

## 2011-08-06 DIAGNOSIS — M2550 Pain in unspecified joint: Secondary | ICD-10-CM | POA: Diagnosis not present

## 2011-08-06 DIAGNOSIS — I4891 Unspecified atrial fibrillation: Secondary | ICD-10-CM

## 2011-08-06 MED ORDER — DONEPEZIL HCL 5 MG PO TABS: 5.0000 mg | ORAL_TABLET | Freq: Every evening | ORAL | Status: DC | PRN

## 2011-08-06 NOTE — Assessment & Plan Note (Signed)
Chronic 2/13 Lipitor intolerant May try smthg else

## 2011-08-06 NOTE — Assessment & Plan Note (Signed)
Continue with current prescription therapy as reflected on the Med list.  

## 2011-08-06 NOTE — Assessment & Plan Note (Signed)
Resolved off Lipitor 

## 2011-08-06 NOTE — Assessment & Plan Note (Signed)
We can try Aricept 5 mg

## 2011-08-06 NOTE — Assessment & Plan Note (Signed)
Chronic Declined Meds

## 2011-08-06 NOTE — Progress Notes (Signed)
Patient ID: Virginia Young, female   DOB: January 25, 1941, 71 y.o.   MRN: [id number]  Subjective:    Patient ID: Virginia Young, female    DOB: December 25, 1940, 71 y.o.   MRN: [id number]  HPI   The patient presents for a follow-up of  chronic hypertension, A fib and chronic dyslipidemia, controlled with medicines.  F/ustiffness and arthralgias - much better off Lipitor C/o memory issues; got lost a few times xmonths - no change   Review of Systems  Constitutional: Negative for chills, activity change, appetite change, fatigue and unexpected weight change.  HENT: Negative for congestion, mouth sores and sinus pressure.   Eyes: Negative for visual disturbance.  Respiratory: Negative for cough and chest tightness.   Gastrointestinal: Negative for nausea and abdominal pain.  Genitourinary: Negative for frequency, difficulty urinating and vaginal pain.  Musculoskeletal: Positive for back pain. Negative for gait problem.  Skin: Negative for pallor and rash.  Neurological: Negative for dizziness, tremors, weakness, numbness and headaches.  Psychiatric/Behavioral: Positive for behavioral problems and decreased concentration. Negative for suicidal ideas, confusion and sleep disturbance. The patient is nervous/anxious.        Objective:   Physical Exam  Constitutional: She is oriented to person, place, and time. She appears well-developed and well-nourished. No distress.  HENT:  Head: Normocephalic.  Right Ear: External ear normal.  Left Ear: External ear normal.  Nose: Nose normal.  Mouth/Throat: Oropharynx is clear and moist.  Eyes: Conjunctivae are normal. Pupils are equal, round, and reactive to light. Right eye exhibits no discharge. Left eye exhibits no discharge.  Neck: Normal range of motion. Neck supple. No JVD present. No tracheal deviation present. No thyromegaly present.  Cardiovascular: Normal rate, regular rhythm and normal heart sounds.   Pulmonary/Chest: No stridor. No  respiratory distress. She has no wheezes.  Abdominal: Soft. Bowel sounds are normal. She exhibits no distension and no mass. There is no tenderness. There is no rebound and no guarding.  Musculoskeletal: She exhibits no edema and no tenderness.  Lymphadenopathy:    She has no cervical adenopathy.  Neurological: She is alert and oriented to person, place, and time. She displays normal reflexes. No cranial nerve deficit. She exhibits normal muscle tone. Coordination (mild) abnormal.  Skin: No rash noted. No erythema.  Psychiatric: She has a normal mood and affect. Her behavior is normal. Judgment and thought content normal.  L post neck mobile lump NT          Assessment & Plan:

## 2011-08-09 ENCOUNTER — Ambulatory Visit (INDEPENDENT_AMBULATORY_CARE_PROVIDER_SITE_OTHER): Payer: Medicare Other | Admitting: *Deleted

## 2011-08-09 DIAGNOSIS — I4891 Unspecified atrial fibrillation: Secondary | ICD-10-CM | POA: Diagnosis not present

## 2011-08-09 DIAGNOSIS — Z7901 Long term (current) use of anticoagulants: Secondary | ICD-10-CM

## 2011-08-09 LAB — POCT INR: INR: 2.1

## 2011-08-14 DIAGNOSIS — N8182 Incompetence or weakening of pubocervical tissue: Secondary | ICD-10-CM | POA: Diagnosis not present

## 2011-08-14 DIAGNOSIS — R319 Hematuria, unspecified: Secondary | ICD-10-CM | POA: Diagnosis not present

## 2011-08-14 DIAGNOSIS — R300 Dysuria: Secondary | ICD-10-CM | POA: Diagnosis not present

## 2011-08-14 DIAGNOSIS — N898 Other specified noninflammatory disorders of vagina: Secondary | ICD-10-CM | POA: Diagnosis not present

## 2011-08-14 DIAGNOSIS — N9489 Other specified conditions associated with female genital organs and menstrual cycle: Secondary | ICD-10-CM | POA: Diagnosis not present

## 2011-08-29 DIAGNOSIS — R300 Dysuria: Secondary | ICD-10-CM | POA: Diagnosis not present

## 2011-08-29 DIAGNOSIS — R350 Frequency of micturition: Secondary | ICD-10-CM | POA: Diagnosis not present

## 2011-08-29 DIAGNOSIS — N949 Unspecified condition associated with female genital organs and menstrual cycle: Secondary | ICD-10-CM | POA: Diagnosis not present

## 2011-09-06 ENCOUNTER — Ambulatory Visit (INDEPENDENT_AMBULATORY_CARE_PROVIDER_SITE_OTHER): Payer: Medicare Other | Admitting: *Deleted

## 2011-09-06 DIAGNOSIS — Z7901 Long term (current) use of anticoagulants: Secondary | ICD-10-CM

## 2011-09-06 DIAGNOSIS — I4891 Unspecified atrial fibrillation: Secondary | ICD-10-CM | POA: Diagnosis not present

## 2011-09-06 LAB — POCT INR: INR: 2.5

## 2011-09-10 DIAGNOSIS — N8182 Incompetence or weakening of pubocervical tissue: Secondary | ICD-10-CM | POA: Diagnosis not present

## 2011-09-11 ENCOUNTER — Ambulatory Visit (INDEPENDENT_AMBULATORY_CARE_PROVIDER_SITE_OTHER): Payer: Medicare Other | Admitting: Pharmacist

## 2011-09-11 DIAGNOSIS — Z7901 Long term (current) use of anticoagulants: Secondary | ICD-10-CM | POA: Diagnosis not present

## 2011-09-11 DIAGNOSIS — I4891 Unspecified atrial fibrillation: Secondary | ICD-10-CM

## 2011-09-11 LAB — POCT INR: INR: 2.8

## 2011-09-23 DIAGNOSIS — R109 Unspecified abdominal pain: Secondary | ICD-10-CM | POA: Diagnosis not present

## 2011-09-23 DIAGNOSIS — D251 Intramural leiomyoma of uterus: Secondary | ICD-10-CM | POA: Diagnosis not present

## 2011-10-08 ENCOUNTER — Ambulatory Visit (INDEPENDENT_AMBULATORY_CARE_PROVIDER_SITE_OTHER): Payer: Medicare Other | Admitting: *Deleted

## 2011-10-08 DIAGNOSIS — Z7901 Long term (current) use of anticoagulants: Secondary | ICD-10-CM

## 2011-10-08 DIAGNOSIS — I4891 Unspecified atrial fibrillation: Secondary | ICD-10-CM | POA: Diagnosis not present

## 2011-10-08 LAB — POCT INR: INR: 2.1

## 2011-10-14 DIAGNOSIS — Z124 Encounter for screening for malignant neoplasm of cervix: Secondary | ICD-10-CM | POA: Diagnosis not present

## 2011-11-07 ENCOUNTER — Ambulatory Visit: Payer: Medicare Other | Admitting: Internal Medicine

## 2011-11-08 ENCOUNTER — Encounter: Payer: Self-pay | Admitting: Internal Medicine

## 2011-11-08 ENCOUNTER — Ambulatory Visit (INDEPENDENT_AMBULATORY_CARE_PROVIDER_SITE_OTHER): Payer: Medicare Other | Admitting: Internal Medicine

## 2011-11-08 VITALS — BP 136/70 | HR 84 | Temp 97.6°F | Resp 16 | Wt 181.0 lb

## 2011-11-08 DIAGNOSIS — I4891 Unspecified atrial fibrillation: Secondary | ICD-10-CM

## 2011-11-08 DIAGNOSIS — R1909 Other intra-abdominal and pelvic swelling, mass and lump: Secondary | ICD-10-CM | POA: Insufficient documentation

## 2011-11-08 DIAGNOSIS — R413 Other amnesia: Secondary | ICD-10-CM | POA: Diagnosis not present

## 2011-11-08 DIAGNOSIS — I509 Heart failure, unspecified: Secondary | ICD-10-CM

## 2011-11-08 DIAGNOSIS — R1903 Right lower quadrant abdominal swelling, mass and lump: Secondary | ICD-10-CM

## 2011-11-08 DIAGNOSIS — M545 Low back pain, unspecified: Secondary | ICD-10-CM

## 2011-11-08 DIAGNOSIS — D1739 Benign lipomatous neoplasm of skin and subcutaneous tissue of other sites: Secondary | ICD-10-CM

## 2011-11-08 DIAGNOSIS — D170 Benign lipomatous neoplasm of skin and subcutaneous tissue of head, face and neck: Secondary | ICD-10-CM

## 2011-11-08 MED ORDER — OXYQUINOLONE SULFATE 0.025 % VA GEL: VAGINAL | Status: DC

## 2011-11-08 MED ORDER — VITAMIN D3 50 MCG (2000 UT) PO TABS: 1.0000 | ORAL_TABLET | Freq: Every day | ORAL | Status: DC

## 2011-11-08 MED ORDER — TRAMADOL HCL 50 MG PO TABS: 50.0000 mg | ORAL_TABLET | Freq: Three times a day (TID) | ORAL | Status: AC | PRN

## 2011-11-08 NOTE — Assessment & Plan Note (Signed)
Stable - surg consult

## 2011-11-08 NOTE — Assessment & Plan Note (Signed)
Doing well 

## 2011-11-08 NOTE — Progress Notes (Signed)
Patient ID: Virginia Young, female   DOB: 17-Aug-1940, 71 y.o.   MRN: [id number] Patient ID: Virginia Young, female   DOB: [birth year], 71 y.o.   MRN: [id number]  Subjective:    Patient ID: Virginia Young, female    DOB: [birth year], 71 y.o.   MRN: [id number]  Groin Pain Associated symptoms include abdominal pain (R groin) and back pain. Pertinent negatives include no chills, frequency, headaches, nausea or rash.     The patient presents for a follow-up of  chronic hypertension, A fib and chronic dyslipidemia, controlled with medicines.  F/u stiffness in LS spine and arthralgias - much better off Lipitor C/o memory issues; got lost a few times xmonths - no change C/o bulging in the R groin she had a hernia repair 40 y ago  BP Readings from Last 3 Encounters:  11/08/11 136/70  08/06/11 122/82  06/21/11 128/70   Wt Readings from Last 3 Encounters:  11/08/11 181 lb (82.101 kg)  08/06/11 184 lb (83.462 kg)  06/21/11 187 lb (84.823 kg)      Review of Systems  Constitutional: Negative for chills, activity change, appetite change, fatigue and unexpected weight change.  HENT: Negative for congestion, mouth sores and sinus pressure.   Eyes: Negative for visual disturbance.  Respiratory: Negative for cough and chest tightness.   Gastrointestinal: Positive for abdominal pain (R groin). Negative for nausea.  Genitourinary: Negative for frequency, difficulty urinating and vaginal pain.  Musculoskeletal: Positive for back pain. Negative for gait problem.  Skin: Negative for pallor and rash.  Neurological: Negative for dizziness, tremors, weakness, numbness and headaches.  Psychiatric/Behavioral: Positive for behavioral problems and decreased concentration. Negative for suicidal ideas, confusion and sleep disturbance. The patient is nervous/anxious.        Objective:   Physical Exam  Constitutional: She is oriented to person, place, and time. She appears well-developed and  well-nourished. No distress.  HENT:  Head: Normocephalic.  Right Ear: External ear normal.  Left Ear: External ear normal.  Nose: Nose normal.  Mouth/Throat: Oropharynx is clear and moist.  Eyes: Conjunctivae are normal. Pupils are equal, round, and reactive to light. Right eye exhibits no discharge. Left eye exhibits no discharge.  Neck: Normal range of motion. Neck supple. No JVD present. No tracheal deviation present. No thyromegaly present.  Cardiovascular: Normal rate and normal heart sounds.   Pulmonary/Chest: No stridor. No respiratory distress. She has no wheezes.  Abdominal: Soft. Bowel sounds are normal. She exhibits no distension and no mass. There is no tenderness. There is no rebound and no guarding.       R groin fullness, tender  Musculoskeletal: She exhibits edema (trace B) and tenderness (LS spine L>>R).  Lymphadenopathy:    She has no cervical adenopathy.  Neurological: She is alert and oriented to person, place, and time. She displays normal reflexes. No cranial nerve deficit. She exhibits normal muscle tone. Coordination (mild) abnormal.  Skin: No rash noted. No erythema.       A lot of veins on LEs  Psychiatric: She has a normal mood and affect. Her behavior is normal. Judgment and thought content normal.  No rash  Lab Results  Component Value Date   WBC 7.6 06/21/2011   HGB 14.7 06/21/2011   HCT 43.7 06/21/2011   PLT 223.0 06/21/2011   GLUCOSE 99 06/21/2011   CHOL 142 08/21/2010   TRIG 72.0 08/21/2010   HDL 62.70 08/21/2010   LDLCALC 65 08/21/2010   ALT 24 06/21/2011  AST 19 06/21/2011   NA 138 06/21/2011   K 4.5 06/21/2011   CL 102 06/21/2011   CREATININE 1.0 06/21/2011   BUN 13 06/21/2011   CO2 30 06/21/2011   TSH 1.23 06/21/2011   INR 2.1 10/08/2011           Assessment & Plan:

## 2011-11-08 NOTE — Assessment & Plan Note (Signed)
In NSR now 

## 2011-11-08 NOTE — Assessment & Plan Note (Signed)
Tramadol prn w/caution. Call if rash

## 2011-11-08 NOTE — Assessment & Plan Note (Signed)
5/13 painful - remote repair of inguinal hernia -- ?relapsed Surg consult

## 2011-11-09 ENCOUNTER — Other Ambulatory Visit: Payer: Self-pay | Admitting: Internal Medicine

## 2011-11-19 ENCOUNTER — Ambulatory Visit (INDEPENDENT_AMBULATORY_CARE_PROVIDER_SITE_OTHER): Payer: Medicare Other | Admitting: *Deleted

## 2011-11-19 DIAGNOSIS — I4891 Unspecified atrial fibrillation: Secondary | ICD-10-CM | POA: Diagnosis not present

## 2011-11-19 DIAGNOSIS — Z7901 Long term (current) use of anticoagulants: Secondary | ICD-10-CM | POA: Diagnosis not present

## 2011-11-19 LAB — POCT INR: INR: 2.5

## 2011-11-27 ENCOUNTER — Encounter (INDEPENDENT_AMBULATORY_CARE_PROVIDER_SITE_OTHER): Payer: Self-pay | Admitting: General Surgery

## 2011-11-27 ENCOUNTER — Ambulatory Visit (INDEPENDENT_AMBULATORY_CARE_PROVIDER_SITE_OTHER): Payer: Medicare Other | Admitting: General Surgery

## 2011-11-27 VITALS — BP 120/80 | HR 63 | Temp 97.8°F | Resp 16 | Ht 71.0 in | Wt 179.8 lb

## 2011-11-27 DIAGNOSIS — R1031 Right lower quadrant pain: Secondary | ICD-10-CM | POA: Diagnosis not present

## 2011-11-27 NOTE — Patient Instructions (Signed)
We will call you with the results of your CT scan  Lipoma A lipoma is a noncancerous (benign) tumor composed of fat cells. They are usually found under the skin (subcutaneous). A lipoma may occur in any tissue of the body that contains fat. Common areas for lipomas to appear include the back, shoulders, buttocks, and thighs. Lipomas are a very common soft tissue growth. They are soft and grow slowly. Most problems caused by a lipoma depend on where it is growing. DIAGNOSIS  A lipoma can be diagnosed with a physical exam. These tumors rarely become cancerous, but radiographic studies can help determine this for certain. Studies used may include:  Computerized X-ray scans (CT or CAT scan).   Computerized magnetic scans (MRI).  TREATMENT  Small lipomas that are not causing problems may be watched. If a lipoma continues to enlarge or causes problems, removal is often the best treatment. Lipomas can also be removed to improve appearance. Surgery is done to remove the fatty cells and the surrounding capsule. Most often, this is done with medicine that numbs the area (local anesthetic). The removed tissue is examined under a microscope to make sure it is not cancerous. Keep all follow-up appointments with your caregiver. SEEK MEDICAL CARE IF:   The lipoma becomes larger or hard.   The lipoma becomes painful, red, or increasingly swollen. These could be signs of infection or a more serious condition.  Document Released: 05/31/2002 Document Revised: 05/30/2011 Document Reviewed: 11/10/2009 [facility] Patient Information 2012 [place], [place].

## 2011-11-28 NOTE — Progress Notes (Signed)
Patient ID: Virginia Young, female   DOB: [birth year], 71 y.o.   MRN: [id number]  Chief Complaint  Patient presents with  . New Evaluation    New Pt. Eval Rt Groin Mass/Lipoma on back of neck and possibly Left arm    HPI Virginia Young is a 71 y.o. female.   HPI 71 year old Caucasian female referred by Dr. Posey Rea called for evaluation of right groin pain as well as a posterior neck mass. The patient states that she had a prior right inguinal hernia repair many years ago. She states over the past several months she's had more discomfort in her right groin. She also states that the area is bulging more than it used to bulge. The more activity she does the more uncomfortable she is in the right groin. She denies any nausea or vomiting. She denies any diarrhea or constipation. She cannot recall when she had a prior hernia repair done. She denies any numbness or tingling in his groin.  With respect to the mass placed parallel and she states that it's been there for 8-10 months. It has not changed in size. Has never been swollen and red. She denies any trauma to the area. She states that she's had prior lipomas removed in the past. She denies any weight loss. She denies any prior history or family history of soft tissue cancers. Past Medical History  Diagnosis Date  . Cardiomyopathy, dilated, nonischemic   . Mitral valve insufficiency and aortic valve insufficiency   . Other and unspecified hyperlipidemia   . CHF (congestive heart failure)   . Arrhythmia     atrial fibrillation  . Other and unspecified mitral valve diseases   . Stasis ulcer   . Absence of menstruation   . Family history of ischemic heart disease   . Unspecified venous (peripheral) insufficiency   . Depressive disorder, not elsewhere classified   . Osteoarthritis     Past Surgical History  Procedure Date  . Lipoma excision     h/o removed from back    Family History  Problem Relation Age of Onset  . Diabetes  Father 56    diabetes and syncope    Social History History  Substance Use Topics  . Smoking status: Never Smoker   . Smokeless tobacco: Never Used  . Alcohol Use: No    Allergies  Allergen Reactions  . Lipitor (Atorvastatin Calcium)     myalgia    Current Outpatient Prescriptions  Medication Sig Dispense Refill  . amiodarone (PACERONE) 200 MG tablet take 1 tablet by mouth daily  30 tablet  0  . Cholecalciferol (VITAMIN D3) 2000 UNITS TABS Take 1 tablet by mouth daily.  100 tablet  3  . clotrimazole-betamethasone (LOTRISONE) cream Apply topically as directed.  45 g  3  . conjugated estrogens (PREMARIN) vaginal cream Place 1 g vaginally as directed. Use 1 g pv at qhs x 3 days then qod x 6 days then weekly       . donepezil (ARICEPT) 5 MG tablet Take 1 tablet (5 mg total) by mouth at bedtime as needed.  30 tablet  11  . ibuprofen (ADVIL,MOTRIN) 200 MG tablet Take 200 mg by mouth every 6 (six) hours as needed.       Marland Kitchen omeprazole (PRILOSEC) 40 MG capsule Take 1 capsule (40 mg total) by mouth daily.  90 capsule  3  . oxybutynin (DITROPAN) 5 MG tablet Take 1 tablet (5 mg total) by mouth daily.  90 tablet  3  . OXYQUINOLONE SULFATE VAGINAL 0.025 % GEL Use 1 g pv qod  113 g  1  . senna (SENOKOT) 8.6 MG tablet Take 1 tablet by mouth as needed.        . warfarin (COUMADIN) 5 MG tablet Take 1 tablet (5 mg total) by mouth as directed.  40 tablet  5    Review of Systems Review of Systems  Constitutional: Negative for fever, activity change, appetite change and unexpected weight change.  HENT: Negative for nosebleeds, neck pain and neck stiffness.   Eyes: Negative for photophobia and visual disturbance.  Respiratory: Negative for chest tightness and shortness of breath.        Some DOE  Cardiovascular: Negative for chest pain and leg swelling.  Gastrointestinal: Negative for abdominal pain, diarrhea, constipation and abdominal distention.  Genitourinary: Negative for dysuria and  difficulty urinating.  Skin: Negative for pallor, rash and wound.  Neurological: Negative for tremors, syncope, speech difficulty and light-headedness.  Hematological: Negative for adenopathy. Does not bruise/bleed easily.  Psychiatric/Behavioral: Negative for self-injury. The patient is not nervous/anxious.     Blood pressure 120/80, pulse 63, temperature 97.8 F (36.6 C), temperature source Temporal, resp. rate 16, height 5\' 11"  (1.803 m), weight 179 lb 12.8 oz (81.557 kg).  Physical Exam Physical Exam  Vitals reviewed. Constitutional: She is oriented to person, place, and time. She appears well-developed and well-nourished. No distress.  HENT:  Head: Normocephalic and atraumatic.  Right Ear: External ear normal.  Left Ear: External ear normal.  Nose: Nose normal.  Eyes: Conjunctivae are normal. No scleral icterus.  Neck: Normal range of motion. Neck supple. No tracheal deviation present. No thyromegaly present.         2 x 2 cm well circumscribed mobile soft subcu mass at base of hairline  Cardiovascular: Normal rate, regular rhythm and normal heart sounds.   Pulmonary/Chest: Effort normal and breath sounds normal. No respiratory distress. She has no wheezes.  Abdominal: Soft. Bowel sounds are normal. She exhibits no distension. There is no tenderness. There is no rebound. No hernia. Hernia confirmed negative in the right inguinal area and confirmed negative in the left inguinal area.         Asymmetry in abd wall in Rt groin vs lt groin. More subcutaneous tissue on rt- no obvious bulge or defect supine or standing or with valsalva  Musculoskeletal: She exhibits no edema and no tenderness.  Lymphadenopathy:    She has no cervical adenopathy.  Neurological: She is alert and oriented to person, place, and time.  Skin: Skin is warm and dry. No rash noted. She is not diaphoretic. No erythema. No pallor.  Psychiatric: She has a normal mood and affect. Her behavior is normal. Thought  content normal.       Appears to have some forgetfulness     Data Reviewed Dr Plotnikov's note  Assessment    Right groin pain Posterior neck lipoma    Plan    We first discussed management of her posterior neck lipoma. We discussed the etiology of lipomas. We discussed surgery including the risk and benefits of surgery. She has elected to leave the area alone for now. With respect to her right groin pain, I do not appreciate a recurrent inguinal hernia on exam. She does have some asymmetry on the right side of her abdomen. However I do not appreciate a direct bulge on physical exam. She is persistent however about there being intermittent bulge on that side. Therefore  we will get a CT scan of the pelvis further delineate her abdominal wall anatomy. Her followup will be based on the results of her CT scan of the pelvis. I encouraged her to contact our office with respect to her lipoma if she notices that it is increasing in size  Virginia Sella. Andrey Campanile, MD, FACS General, Bariatric, & Minimally Invasive Surgery [facility], [place]        [facility] M 11/28/2011, 3:28 PM

## 2011-12-03 ENCOUNTER — Telehealth (INDEPENDENT_AMBULATORY_CARE_PROVIDER_SITE_OTHER): Payer: Self-pay

## 2011-12-03 ENCOUNTER — Ambulatory Visit
Admission: RE | Admit: 2011-12-03 | Discharge: 2011-12-03 | Disposition: A | Discharge status: Home / Self Care | Payer: Medicare Other | Source: Ambulatory Visit | Attending: General Surgery | Admitting: General Surgery

## 2011-12-03 DIAGNOSIS — D259 Leiomyoma of uterus, unspecified: Secondary | ICD-10-CM | POA: Diagnosis not present

## 2011-12-03 DIAGNOSIS — R1031 Right lower quadrant pain: Secondary | ICD-10-CM

## 2011-12-03 MED ORDER — IOHEXOL 300 MG/ML  SOLN
100.0000 mL | Freq: Once | INTRAMUSCULAR | Status: AC | PRN
  Administered 2011-12-03: 100 mL via INTRAVENOUS

## 2011-12-03 NOTE — Telephone Encounter (Signed)
Pt called to notify her the CT scan shows no hernia but just some weakness on the right side. The pt understands.

## 2011-12-09 ENCOUNTER — Encounter: Payer: Self-pay | Admitting: Internal Medicine

## 2011-12-09 ENCOUNTER — Ambulatory Visit (INDEPENDENT_AMBULATORY_CARE_PROVIDER_SITE_OTHER): Payer: Medicare Other | Admitting: Internal Medicine

## 2011-12-09 VITALS — BP 148/80 | HR 55 | Ht 71.0 in | Wt 179.0 lb

## 2011-12-09 DIAGNOSIS — I4891 Unspecified atrial fibrillation: Secondary | ICD-10-CM | POA: Diagnosis not present

## 2011-12-09 LAB — CBC WITH DIFFERENTIAL/PLATELET
Basophils Absolute: 0.1 10*3/uL (ref 0.0–0.1)
Basophils Relative: 0.8 % (ref 0.0–3.0)
Eosinophils Absolute: 0.1 10*3/uL (ref 0.0–0.7)
Eosinophils Relative: 1.8 % (ref 0.0–5.0)
HCT: 41.8 % (ref 36.0–46.0)
Hemoglobin: 13.8 g/dL (ref 12.0–15.0)
Lymphocytes Relative: 23.5 % (ref 12.0–46.0)
Lymphs Abs: 1.6 10*3/uL (ref 0.7–4.0)
MCHC: 32.9 g/dL (ref 30.0–36.0)
MCV: 89.1 fl (ref 78.0–100.0)
Monocytes Absolute: 0.6 10*3/uL (ref 0.1–1.0)
Monocytes Relative: 8 % (ref 3.0–12.0)
Neutro Abs: 4.6 10*3/uL (ref 1.4–7.7)
Neutrophils Relative %: 65.9 % (ref 43.0–77.0)
Platelets: 210 10*3/uL (ref 150.0–400.0)
RBC: 4.69 Mil/uL (ref 3.87–5.11)
RDW: 14.8 % — ABNORMAL HIGH (ref 11.5–14.6)
WBC: 6.9 10*3/uL (ref 4.5–10.5)

## 2011-12-09 LAB — HEPATIC FUNCTION PANEL
ALT: 26 U/L (ref 0–35)
AST: 27 U/L (ref 0–37)
Albumin: 3.8 g/dL (ref 3.5–5.2)
Alkaline Phosphatase: 122 U/L — ABNORMAL HIGH (ref 39–117)
Bilirubin, Direct: 0.1 mg/dL (ref 0.0–0.3)
Total Bilirubin: 0.3 mg/dL (ref 0.3–1.2)
Total Protein: 6.7 g/dL (ref 6.0–8.3)

## 2011-12-09 LAB — TSH: TSH: 0.98 u[IU]/mL (ref 0.35–5.50)

## 2011-12-09 NOTE — Patient Instructions (Signed)
Lab work today. We will call you with results.  Nurse Visit in 2 weeks for BP check.

## 2011-12-09 NOTE — Progress Notes (Signed)
HPI Patient is a 71 year old with a history of afib and mild LV dysfunciton.  I saw her in May 2012.   Feeling better now that off lipitor.   Breathing is OK  No palpitations. Staying very actvie in yard.   Allergies  Allergen Reactions  . Lipitor (Atorvastatin Calcium)     myalgia    Current Outpatient Prescriptions  Medication Sig Dispense Refill  . amiodarone (PACERONE) 200 MG tablet take 1 tablet by mouth daily  30 tablet  0  . Cholecalciferol (VITAMIN D3) 2000 UNITS TABS Take 1 tablet by mouth daily.  100 tablet  3  . clotrimazole-betamethasone (LOTRISONE) cream Apply topically as directed.  45 g  3  . conjugated estrogens (PREMARIN) vaginal cream Place 1 g vaginally as directed. Use 1 g pv at qhs x 3 days then qod x 6 days then weekly       . donepezil (ARICEPT) 5 MG tablet Take 1 tablet (5 mg total) by mouth at bedtime as needed.  30 tablet  11  . ibuprofen (ADVIL,MOTRIN) 200 MG tablet Take 200 mg by mouth every 6 (six) hours as needed.       Marland Kitchen omeprazole (PRILOSEC) 40 MG capsule Take 1 capsule (40 mg total) by mouth daily.  90 capsule  3  . senna (SENOKOT) 8.6 MG tablet Take 1 tablet by mouth as needed.        . warfarin (COUMADIN) 5 MG tablet Take 1 tablet (5 mg total) by mouth as directed.  40 tablet  5  . oxybutynin (DITROPAN) 5 MG tablet Take 1 tablet (5 mg total) by mouth daily.  90 tablet  3  . OXYQUINOLONE SULFATE VAGINAL 0.025 % GEL Use 1 g pv qod  113 g  1    Past Medical History  Diagnosis Date  . Cardiomyopathy, dilated, nonischemic   . Mitral valve insufficiency and aortic valve insufficiency   . Other and unspecified hyperlipidemia   . CHF (congestive heart failure)   . Arrhythmia     atrial fibrillation  . Other and unspecified mitral valve diseases   . Stasis ulcer   . Absence of menstruation   . Family history of ischemic heart disease   . Unspecified venous (peripheral) insufficiency   . Depressive disorder, not elsewhere classified   .  Osteoarthritis     Past Surgical History  Procedure Date  . Lipoma excision     h/o removed from back    Family History  Problem Relation Age of Onset  . Diabetes Father 20    diabetes and syncope    History   Social History  . Marital Status: Divorced    Spouse Name: N/A    Number of Children: N/A  . Years of Education: N/A   Occupational History  . retired    Social History Main Topics  . Smoking status: Never Smoker   . Smokeless tobacco: Never Used  . Alcohol Use: No  . Drug Use: No  . Sexually Active: Not Currently   Other Topics Concern  . Not on file   Social History Narrative   [employer] RELEASE ON FILE. Daphane Shepherd, April 26, 2010 10:09 AM.    Review of Systems:  All systems reviewed.  They are negative to the above problem except as previously stated.  Vital Signs: BP 148/80  Pulse 55  Ht 5\' 11"  (1.803 m)  Wt 179 lb (81.194 kg)  BMI 24.97 kg/m2  Physical Exam Patient in  NAD BP on recheck 179/87 HEENT:  Normocephalic, atraumatic. EOMI, PERRLA.  Neck: JVP is normal. No thyromegaly. No bruits.  Lungs: clear to auscultation. No rales no wheezes.  Heart: Regular rate and rhythm. Normal S1, S2. No S3.   No significant murmurs. PMI not displaced.  Abdomen:  Supple, nontender. Normal bowel sounds. No masses. No hepatomegaly.  EKG:  Sinus bradycardia  54 bpm.    Extremities:   Good distal pulses throughout. No lower extremity edema.  Musculoskeletal :moving all extremities.  Neuro:   alert and oriented x3.  CN II-XII grossly intact.   Assessment and Plan:  1.  PAF.  I would keep on same regimen.  Clinically it does not seem that she has had afib. CHeck labs with amio use.  2.  Dyslipidemia.  Did not tolerate lipitor.  Wil hold Rx for now. 3.  HTN.  BP on recheck was 179.  Will have the patient come back for BP in a couple wks.

## 2011-12-25 ENCOUNTER — Other Ambulatory Visit: Payer: Self-pay | Admitting: Internal Medicine

## 2011-12-31 ENCOUNTER — Ambulatory Visit (INDEPENDENT_AMBULATORY_CARE_PROVIDER_SITE_OTHER): Payer: Medicare Other | Admitting: *Deleted

## 2011-12-31 DIAGNOSIS — Z7901 Long term (current) use of anticoagulants: Secondary | ICD-10-CM | POA: Diagnosis not present

## 2011-12-31 DIAGNOSIS — I4891 Unspecified atrial fibrillation: Secondary | ICD-10-CM | POA: Diagnosis not present

## 2011-12-31 LAB — POCT INR: INR: 2.1

## 2012-01-07 DIAGNOSIS — H25099 Other age-related incipient cataract, unspecified eye: Secondary | ICD-10-CM | POA: Diagnosis not present

## 2012-01-13 ENCOUNTER — Ambulatory Visit (INDEPENDENT_AMBULATORY_CARE_PROVIDER_SITE_OTHER): Payer: Medicare Other | Admitting: Internal Medicine

## 2012-01-13 ENCOUNTER — Ambulatory Visit (INDEPENDENT_AMBULATORY_CARE_PROVIDER_SITE_OTHER)
Admission: RE | Admit: 2012-01-13 | Discharge: 2012-01-13 | Disposition: A | Discharge status: Home / Self Care | Payer: Medicare Other | Source: Ambulatory Visit | Attending: Internal Medicine | Admitting: Internal Medicine

## 2012-01-13 ENCOUNTER — Encounter: Payer: Self-pay | Admitting: Internal Medicine

## 2012-01-13 VITALS — BP 110/70 | HR 95 | Temp 98.2°F | Ht 71.0 in

## 2012-01-13 DIAGNOSIS — M79609 Pain in unspecified limb: Secondary | ICD-10-CM

## 2012-01-13 DIAGNOSIS — M25521 Pain in right elbow: Secondary | ICD-10-CM

## 2012-01-13 DIAGNOSIS — M79631 Pain in right forearm: Secondary | ICD-10-CM

## 2012-01-13 DIAGNOSIS — M25539 Pain in unspecified wrist: Secondary | ICD-10-CM | POA: Diagnosis not present

## 2012-01-13 DIAGNOSIS — M25531 Pain in right wrist: Secondary | ICD-10-CM

## 2012-01-13 DIAGNOSIS — S52123A Displaced fracture of head of unspecified radius, initial encounter for closed fracture: Secondary | ICD-10-CM

## 2012-01-13 DIAGNOSIS — S52109A Unspecified fracture of upper end of unspecified radius, initial encounter for closed fracture: Secondary | ICD-10-CM | POA: Diagnosis not present

## 2012-01-13 DIAGNOSIS — M25529 Pain in unspecified elbow: Secondary | ICD-10-CM

## 2012-01-13 DIAGNOSIS — Z043 Encounter for examination and observation following other accident: Secondary | ICD-10-CM | POA: Diagnosis not present

## 2012-01-13 DIAGNOSIS — S52121A Displaced fracture of head of right radius, initial encounter for closed fracture: Secondary | ICD-10-CM

## 2012-01-13 MED ORDER — KETOROLAC TROMETHAMINE 30 MG/ML IJ SOLN
30.0000 mg | Freq: Once | INTRAMUSCULAR | Status: AC
  Administered 2012-01-13: 30 mg via INTRAMUSCULAR

## 2012-01-13 NOTE — Patient Instructions (Addendum)
It was good to see you today. xrays of Right arm, elbow and wrist ordered today. Your results will be discussed with you after review (48-72hours after test completion). If any changes/referrals need to be made, you will be notified at the same time. Toradol shot given for pain today  To see Dr Charlett Blake 300pm today at [facility] orthopedics

## 2012-01-13 NOTE — Progress Notes (Signed)
Subjective:    Patient ID: Virginia Young, female    DOB: 20-Sep-1940, 71 y.o.   MRN: [id number]  Arm Pain  Incident onset: 36h ago. Incident location: at sister's house. The injury mechanism was a fall. The pain is present in the right elbow, right forearm and right wrist. The quality of the pain is described as aching and shooting. The pain radiates to the right hand and right arm. The pain is at a severity of 7/10. The pain has been constant since the incident. Pertinent negatives include no chest pain, muscle weakness, numbness or tingling. The symptoms are aggravated by movement, lifting and palpation. She has tried ice, immobilization and acetaminophen for the symptoms. The treatment provided mild relief.   Past Medical History  Diagnosis Date  . Cardiomyopathy, dilated, nonischemic   . Mitral valve insufficiency and aortic valve insufficiency   . Other and unspecified hyperlipidemia   . CHF (congestive heart failure)   . Arrhythmia     atrial fibrillation  . Other and unspecified mitral valve diseases   . Stasis ulcer   . Absence of menstruation   . Family history of ischemic heart disease   . Unspecified venous (peripheral) insufficiency   . Depressive disorder, not elsewhere classified   . Osteoarthritis     Review of Systems  Constitutional: Negative for fever and fatigue.  HENT: Negative for neck pain.   Eyes: Negative for visual disturbance.  Cardiovascular: Negative for chest pain and palpitations.  Musculoskeletal: Negative for back pain.  Neurological: Negative for dizziness, tingling, tremors, syncope, facial asymmetry, speech difficulty, weakness, light-headedness, numbness and headaches.       Objective:   Physical Exam BP 110/70  Pulse 95  Temp 98.2 F (36.8 C) (Oral)  Ht 5\' 11"  (1.803 m)  SpO2 96% Wt Readings from Last 3 Encounters:  12/09/11 179 lb (81.194 kg)  11/27/11 179 lb 12.8 oz (81.557 kg)  11/08/11 181 lb (82.101 kg)   Constitutional: She  appears well-developed and well-nourished. Uncomfortable, but no acute distress.  HENT: Head: slight bruise/tiny abrasion to R foreahead. Ears: B TMs ok, no erythema or effusion; Nose: Nose normal.  Eyes: Conjunctivae and EOM are normal. Pupils are equal, round, and reactive to light. No scleral icterus.  Neck: Normal range of motion. Neck supple. No JVD present. No thyromegaly present.  Cardiovascular: Normal rate, irregular rhythm and normal heart sounds.  No murmur heard. No BLE edema. Pulmonary/Chest: Effort normal and breath sounds normal. No respiratory distress. She has no wheezes.  Musculoskeletal: neck and R shoulder with normal range of motion, painful supination and pronation. Slight swelling proximal forearm with pain to palpation over flexor surface (distal to elbow) and lateral epicondyl region - no joint effusions or olecranon bursitis. No gross deformities. Wrist with pain over snuff box but FROM. Fingers with FROM and neurovascularly intact distally  L knee with FROM, ligamentous function intact. no effusion, non tender to palpation. Neurological: She is alert and oriented to person, place, and time. No cranial nerve deficit. Coordination normal.  Skin: Skin is warm and dry. No rash noted. No erythema. sight bruising inner L knee Psychiatric: She has a normal mood and affect. Her behavior is normal. Judgment and thought content normal.   Lab Results  Component Value Date   WBC 6.9 12/09/2011   HGB 13.8 12/09/2011   HCT 41.8 12/09/2011   PLT 210.0 12/09/2011   GLUCOSE 99 06/21/2011   CHOL 142 08/21/2010   TRIG 72.0 08/21/2010  HDL 62.70 08/21/2010   LDLCALC 65 08/21/2010   ALT 26 12/09/2011   AST 27 12/09/2011   NA 138 06/21/2011   K 4.5 06/21/2011   CL 102 06/21/2011   CREATININE 1.0 06/21/2011   BUN 13 06/21/2011   CO2 30 06/21/2011   TSH 0.98 12/09/2011   INR 2.1 12/31/2011       Assessment & Plan:  Fall - trauma with injury to RUE 36h ago rule out fracture proximal  radius or other fracture/injury  Toradol IM for acute pain today -  xrays - elbow, forearm and wrist Continue tylenol and refer to ortho for splinting/care as needed  Addendum - xray with radial head fracture - to ortho: [facility] 300pm today

## 2012-01-20 DIAGNOSIS — S52123A Displaced fracture of head of unspecified radius, initial encounter for closed fracture: Secondary | ICD-10-CM | POA: Diagnosis not present

## 2012-01-20 DIAGNOSIS — S5290XD Unspecified fracture of unspecified forearm, subsequent encounter for closed fracture with routine healing: Secondary | ICD-10-CM | POA: Diagnosis not present

## 2012-01-20 DIAGNOSIS — H18419 Arcus senilis, unspecified eye: Secondary | ICD-10-CM | POA: Diagnosis not present

## 2012-01-20 DIAGNOSIS — H2510 Age-related nuclear cataract, unspecified eye: Secondary | ICD-10-CM | POA: Diagnosis not present

## 2012-01-27 DIAGNOSIS — H269 Unspecified cataract: Secondary | ICD-10-CM | POA: Diagnosis not present

## 2012-01-27 DIAGNOSIS — H2510 Age-related nuclear cataract, unspecified eye: Secondary | ICD-10-CM | POA: Diagnosis not present

## 2012-01-28 DIAGNOSIS — H2510 Age-related nuclear cataract, unspecified eye: Secondary | ICD-10-CM | POA: Diagnosis not present

## 2012-02-10 DIAGNOSIS — S5290XD Unspecified fracture of unspecified forearm, subsequent encounter for closed fracture with routine healing: Secondary | ICD-10-CM | POA: Diagnosis not present

## 2012-02-10 DIAGNOSIS — S52123A Displaced fracture of head of unspecified radius, initial encounter for closed fracture: Secondary | ICD-10-CM | POA: Diagnosis not present

## 2012-02-11 ENCOUNTER — Ambulatory Visit (INDEPENDENT_AMBULATORY_CARE_PROVIDER_SITE_OTHER): Payer: Medicare Other | Admitting: Pharmacist

## 2012-02-11 DIAGNOSIS — I4891 Unspecified atrial fibrillation: Secondary | ICD-10-CM

## 2012-02-11 DIAGNOSIS — Z7901 Long term (current) use of anticoagulants: Secondary | ICD-10-CM

## 2012-02-11 LAB — POCT INR: INR: 2.7

## 2012-02-14 DIAGNOSIS — R82998 Other abnormal findings in urine: Secondary | ICD-10-CM | POA: Diagnosis not present

## 2012-02-14 DIAGNOSIS — N8182 Incompetence or weakening of pubocervical tissue: Secondary | ICD-10-CM | POA: Diagnosis not present

## 2012-02-14 DIAGNOSIS — R319 Hematuria, unspecified: Secondary | ICD-10-CM | POA: Diagnosis not present

## 2012-02-14 DIAGNOSIS — R300 Dysuria: Secondary | ICD-10-CM | POA: Diagnosis not present

## 2012-02-18 ENCOUNTER — Ambulatory Visit: Payer: Medicare Other | Admitting: Internal Medicine

## 2012-02-26 ENCOUNTER — Encounter: Payer: Self-pay | Admitting: Pharmacist

## 2012-02-26 DIAGNOSIS — S5290XD Unspecified fracture of unspecified forearm, subsequent encounter for closed fracture with routine healing: Secondary | ICD-10-CM | POA: Diagnosis not present

## 2012-02-26 DIAGNOSIS — S52123A Displaced fracture of head of unspecified radius, initial encounter for closed fracture: Secondary | ICD-10-CM | POA: Diagnosis not present

## 2012-03-16 DIAGNOSIS — H2510 Age-related nuclear cataract, unspecified eye: Secondary | ICD-10-CM | POA: Diagnosis not present

## 2012-03-16 DIAGNOSIS — H269 Unspecified cataract: Secondary | ICD-10-CM | POA: Diagnosis not present

## 2012-03-24 ENCOUNTER — Ambulatory Visit (INDEPENDENT_AMBULATORY_CARE_PROVIDER_SITE_OTHER): Payer: Medicare Other | Admitting: *Deleted

## 2012-03-24 DIAGNOSIS — Z7901 Long term (current) use of anticoagulants: Secondary | ICD-10-CM

## 2012-03-24 DIAGNOSIS — R079 Chest pain, unspecified: Secondary | ICD-10-CM

## 2012-03-24 DIAGNOSIS — I4891 Unspecified atrial fibrillation: Secondary | ICD-10-CM | POA: Diagnosis not present

## 2012-03-24 HISTORY — DX: Chest pain, unspecified: R07.9

## 2012-03-24 LAB — POCT INR: INR: 2.2

## 2012-04-03 DIAGNOSIS — Z23 Encounter for immunization: Secondary | ICD-10-CM | POA: Diagnosis not present

## 2012-04-03 DIAGNOSIS — Z1231 Encounter for screening mammogram for malignant neoplasm of breast: Secondary | ICD-10-CM | POA: Diagnosis not present

## 2012-04-13 DIAGNOSIS — R300 Dysuria: Secondary | ICD-10-CM | POA: Diagnosis not present

## 2012-04-13 DIAGNOSIS — R82998 Other abnormal findings in urine: Secondary | ICD-10-CM | POA: Diagnosis not present

## 2012-04-13 DIAGNOSIS — N360 Urethral fistula: Secondary | ICD-10-CM | POA: Diagnosis not present

## 2012-04-14 ENCOUNTER — Ambulatory Visit (INDEPENDENT_AMBULATORY_CARE_PROVIDER_SITE_OTHER): Payer: Medicare Other | Admitting: Physician Assistant

## 2012-04-14 ENCOUNTER — Telehealth: Payer: Self-pay | Admitting: Internal Medicine

## 2012-04-14 ENCOUNTER — Other Ambulatory Visit (INDEPENDENT_AMBULATORY_CARE_PROVIDER_SITE_OTHER): Payer: Medicare Other

## 2012-04-14 ENCOUNTER — Encounter: Payer: Self-pay | Admitting: Physician Assistant

## 2012-04-14 VITALS — BP 125/73 | HR 60 | Ht 71.0 in | Wt 176.8 lb

## 2012-04-14 DIAGNOSIS — R079 Chest pain, unspecified: Secondary | ICD-10-CM

## 2012-04-14 DIAGNOSIS — R5383 Other fatigue: Secondary | ICD-10-CM

## 2012-04-14 DIAGNOSIS — Z7901 Long term (current) use of anticoagulants: Secondary | ICD-10-CM | POA: Diagnosis not present

## 2012-04-14 DIAGNOSIS — M2550 Pain in unspecified joint: Secondary | ICD-10-CM | POA: Diagnosis not present

## 2012-04-14 DIAGNOSIS — R0602 Shortness of breath: Secondary | ICD-10-CM

## 2012-04-14 DIAGNOSIS — E785 Hyperlipidemia, unspecified: Secondary | ICD-10-CM

## 2012-04-14 DIAGNOSIS — R5381 Other malaise: Secondary | ICD-10-CM

## 2012-04-14 DIAGNOSIS — K219 Gastro-esophageal reflux disease without esophagitis: Secondary | ICD-10-CM

## 2012-04-14 DIAGNOSIS — I5022 Chronic systolic (congestive) heart failure: Secondary | ICD-10-CM

## 2012-04-14 DIAGNOSIS — R413 Other amnesia: Secondary | ICD-10-CM

## 2012-04-14 DIAGNOSIS — I509 Heart failure, unspecified: Secondary | ICD-10-CM | POA: Diagnosis not present

## 2012-04-14 DIAGNOSIS — I4891 Unspecified atrial fibrillation: Secondary | ICD-10-CM | POA: Diagnosis not present

## 2012-04-14 DIAGNOSIS — F3289 Other specified depressive episodes: Secondary | ICD-10-CM

## 2012-04-14 DIAGNOSIS — F329 Major depressive disorder, single episode, unspecified: Secondary | ICD-10-CM

## 2012-04-14 LAB — CBC WITH DIFFERENTIAL/PLATELET
Basophils Absolute: 0.1 10*3/uL (ref 0.0–0.1)
Basophils Relative: 0.8 % (ref 0.0–3.0)
Eosinophils Absolute: 0.2 10*3/uL (ref 0.0–0.7)
Eosinophils Relative: 2.2 % (ref 0.0–5.0)
HCT: 43.4 % (ref 36.0–46.0)
Hemoglobin: 14.2 g/dL (ref 12.0–15.0)
Lymphocytes Relative: 26.6 % (ref 12.0–46.0)
Lymphs Abs: 2.5 10*3/uL (ref 0.7–4.0)
MCHC: 32.7 g/dL (ref 30.0–36.0)
MCV: 89.5 fl (ref 78.0–100.0)
Monocytes Absolute: 0.7 10*3/uL (ref 0.1–1.0)
Monocytes Relative: 7.4 % (ref 3.0–12.0)
Neutro Abs: 6 10*3/uL (ref 1.4–7.7)
Neutrophils Relative %: 63 % (ref 43.0–77.0)
Platelets: 270 10*3/uL (ref 150.0–400.0)
RBC: 4.85 Mil/uL (ref 3.87–5.11)
RDW: 14.7 % — ABNORMAL HIGH (ref 11.5–14.6)
WBC: 9.5 10*3/uL (ref 4.5–10.5)

## 2012-04-14 NOTE — Patient Instructions (Addendum)
Your physician recommends that you return for lab work in: TODAY @ [facility] ON [address] FROM [facility].  CXR TODAY AT [facility]  Your physician has requested that you have a lexiscan myoview TRY TO HAVE DONE THIS WEEK DX CHEST PAIN. For further information please visit [email or web address] Please follow instruction sheet, as given.  TAKE PRILOSEC 40 MG 30 MINUTES BEFORE MEALS TWICE DAILY FOR 3 DAYS THEN DECREASE TO 40 MG DAILY  2 WEEK F/U WITH DR. Tenny Craw.

## 2012-04-14 NOTE — Telephone Encounter (Signed)
New problem:  C/O indigestion , burp, sharp pain - through shoulder. Advise per pcp to contacted her cardiology.

## 2012-04-14 NOTE — Telephone Encounter (Signed)
Patient called stated she has had rt sided chest pain radiates up into rt shoulder for the last 2 days.States the pain comes and goes last appox 1 min or less,no pain at present.Spoke to DOD Dr.Katz he advised schedule appointment with PA.Appointment scheduled with Tereso Newcomer PA today 04/14/12 at 3:20 pm.

## 2012-04-14 NOTE — Progress Notes (Addendum)
[address], [address] [place], [place]  [address] Phone: [phone], Fax:  [phone]  Date:  04/14/2012   Name:  Virginia Young   DOB:  [birth year]   MRN:  [id number]  PCP:  Sonda Primes, MD  Primary Cardiologist:  Dr. Dietrich Pates  Primary Electrophysiologist:  None    History of Present Illness: Virginia Young is a 71 y.o. female who returns for evaluation of chest pain.  She has a history of atrial fibrillation, maintaining sinus rhythm on amiodarone, mild LV dysfunction (EF in the past 40-45%) thought to be nonischemic with a negative Cardiolite in 2003, HTN, HL. Last echocardiogram 12/11: EF 35-40%, mild MR, mild LAE, mild RAE, small pericardial effusion.  Last seen by Dr. Tenny Craw 6/13.  She called in today with complaints of right sided chest pain and was added on to my schedule.  Pain started yesterday.  It comes and goes.  Seems to come on with movement as well as exertion.  Pain is sharp and radiates to shoulder and upper arm.  She notes chronic DOE.  Thinks it is getting worse.  Probably Class II-IIb.  She feels fatigued.  No LE edema.  No orthopnea, PND.  No syncope.  No pleuritic CP.  No CP with lying supine.  She has had increased belching starting yesterday.  No recent trips or hospital stays.  Of note, she broke her right forearm several mos ago.  It seems to have healed well.    Labs (2/12:      LDL 65 Labs (12/12):   K 4.5, creatinine 1.0, ALT 24 Labs (6/13):     Hgb 13.8, TSH 0.98  Wt Readings from Last 3 Encounters:  04/14/12 176 lb 12.8 oz (80.196 kg)  12/09/11 179 lb (81.194 kg)  11/27/11 179 lb 12.8 oz (81.557 kg)     Past Medical History  Diagnosis Date  . NICM (nonischemic cardiomyopathy)     a. neg CLite in 2003;  b. EF 40-45% in past;   c.  Echo 12/11: EF 35-40%, mild MR, mild LAE, mild RAE, small pericardial effusion   . Mild mitral regurgitation   . HLD (hyperlipidemia)   . Chronic systolic heart failure   . Atrial fibrillation      a. coumadin;  b. Amiodarone  . Stasis ulcer   . Absence of menstruation   . Family history of ischemic heart disease   . Varicose veins   . Depression   . Osteoarthritis     Current Outpatient Prescriptions  Medication Sig Dispense Refill  . amiodarone (PACERONE) 200 MG tablet take 1 tablet by mouth once daily  30 tablet  5  . Cholecalciferol (VITAMIN D3) 2000 UNITS TABS Take 1 tablet by mouth daily.  100 tablet  3  . clotrimazole-betamethasone (LOTRISONE) cream Apply topically as directed.  45 g  3  . conjugated estrogens (PREMARIN) vaginal cream Place 1 g vaginally as directed. Use 1 g pv at qhs x 3 days then qod x 6 days then weekly       . donepezil (ARICEPT) 5 MG tablet Take 1 tablet (5 mg total) by mouth at bedtime as needed.  30 tablet  11  . omeprazole (PRILOSEC) 40 MG capsule Take 40 mg by mouth as needed.      Marland Kitchen oxybutynin (DITROPAN) 5 MG tablet Take 5 mg by mouth as needed.      Dani Gobble SULFATE VAGINAL 0.025 % GEL As needed      .  senna (SENOKOT) 8.6 MG tablet Take 1 tablet by mouth as needed.        . warfarin (COUMADIN) 5 MG tablet Take 1 tablet (5 mg total) by mouth as directed.  40 tablet  5  . DISCONTD: omeprazole (PRILOSEC) 40 MG capsule Take 1 capsule (40 mg total) by mouth daily.  90 capsule  3    Allergies: Allergies  Allergen Reactions  . Lipitor (Atorvastatin Calcium)     myalgia    Social History:   reports that she has never smoked. She has never used smokeless tobacco. She reports that she does not drink alcohol or use illicit drugs.   Family History:  family history includes CAD in an unspecified family member; Diabetes (age of onset:73) in her father; and Heart attack in her brother, father, and sister.   ROS:  Please see the history of present illness.   She notes some dysphagia.  also notes increased belching.  No odynophagia or unexpected weight loss.  No melena, hematochezia, hematemesis.  Does note dysuria.  GYN collected u/a yesterday  (has not heard results).   All other systems reviewed and negative.   PHYSICAL EXAM: VS:  BP 125/73  Pulse 60  Ht 5\' 11"  (1.803 m)  Wt 176 lb 12.8 oz (80.196 kg)  BMI 24.66 kg/m2 Well nourished, well developed, in no acute distress HEENT: normal Neck: no JVD Vascular:  No carotid bruits Cardiac:  normal S1, S2; RRR; no murmur; no rub Chest:  Somewhat tender to palpation over right chest and shoulder (of note this is not exactly her presenting pain) Lungs:  clear to auscultation bilaterally, no wheezing, rhonchi or rales Abd: soft, nontender, no hepatomegaly Ext: no edema; multiple varicosities noted Skin: warm and dry Neuro:  CNs 2-12 intact, no focal abnormalities noted  EKG:  NSR, HR 60, PACs, no ischemic changes     ASSESSMENT AND PLAN:  1. Chest Pain:   Atypical > typical features.  She has a very strong FHx of CAD.  Most of her symptoms sound like they are from GERD or possibly musculoskeletal.  ECG is normal.  However, she seems to have some symptoms that are brought on by activity.  I will check labs:  BMET, CBC, TSH, Troponin, BNP, LFTs.  Also check CXR.  If Troponin is abnormal, she will need to go to the ED.  If it is normal, I will have her undergo a Lexiscan Myoview.  Follow up in 2 weeks with Dr. Tenny Craw or me.  2. GERD:   She is off her PPI.  I suspect this is contributing.  I have recommended she restart this at BID x 3 days, then once daily.  Check LFTs as noted.  If ALP is up, she will need follow up with PCP and abdominal u/s to rule out GB disease.  3. Chronic Systolic CHF:   Overall, volume looks stable.  Check BNP as noted.  Will get follow up on EF with myoview.  Follow up as noted.  4. Atrial Fibrillation:   Maintaining NSR on amiodarone.  Get LFTs, TSH and CXR.    5. Fatigue:   Check labs as noted.  Sounds like she has a UTI but has not heard from GYN yet.    6. Dyspnea:   Check labs and CXR as noted as well as Myoview.  Follow up as planned.    Luna Glasgow, PA-C  3:43 PM 04/14/2012

## 2012-04-15 ENCOUNTER — Telehealth: Payer: Self-pay | Admitting: *Deleted

## 2012-04-15 LAB — TROPONIN I: Troponin I: <0.01 ng/mL (ref <0.06)

## 2012-04-15 LAB — HEPATIC FUNCTION PANEL
ALT: 22 U/L (ref 0–35)
AST: 23 U/L (ref 0–37)
Albumin: 3.7 g/dL (ref 3.5–5.2)
Alkaline Phosphatase: 125 U/L — ABNORMAL HIGH (ref 39–117)
Bilirubin, Direct: 0.1 mg/dL (ref 0.0–0.3)
Total Bilirubin: 0.6 mg/dL (ref 0.3–1.2)
Total Protein: 7 g/dL (ref 6.0–8.3)

## 2012-04-15 LAB — LIPID PANEL
Cholesterol: 190 mg/dL (ref 0–200)
HDL: 64.4 mg/dL (ref >39.00)
LDL Cholesterol: 113 mg/dL — ABNORMAL HIGH (ref 0–99)
Total CHOL/HDL Ratio: 3
Triglycerides: 64 mg/dL (ref 0.0–149.0)
VLDL: 12.8 mg/dL (ref 0.0–40.0)

## 2012-04-15 LAB — BASIC METABOLIC PANEL
BUN: 14 mg/dL (ref 6–23)
CO2: 31 mEq/L (ref 19–32)
Calcium: 9.1 mg/dL (ref 8.4–10.5)
Chloride: 99 mEq/L (ref 96–112)
Creatinine, Ser: 0.8 mg/dL (ref 0.4–1.2)
GFR: 70.78 mL/min (ref >60.00)
Glucose, Bld: 91 mg/dL (ref 70–99)
Potassium: 3.8 mEq/L (ref 3.5–5.1)
Sodium: 136 mEq/L (ref 135–145)

## 2012-04-15 NOTE — Telephone Encounter (Signed)
Message copied by Tarri Fuller on Wed Apr 15, 2012  2:20 PM ------      Message from: [place], [place] T      Created: Wed Apr 15, 2012  1:12 PM       Troponin normal       Tereso Newcomer, [place]  1:12 PM 04/15/2012

## 2012-04-15 NOTE — Telephone Encounter (Signed)
pt notified about troponin lab work normal, pt verbalized understanding today. I told pt that I w/cb when I the rest of other labs and cxr results in

## 2012-04-16 LAB — TSH: TSH: 1.16 u[IU]/mL (ref 0.35–5.50)

## 2012-04-16 LAB — BRAIN NATRIURETIC PEPTIDE: Pro B Natriuretic peptide (BNP): 104 pg/mL — ABNORMAL HIGH (ref 0.0–100.0)

## 2012-04-17 ENCOUNTER — Other Ambulatory Visit: Payer: Self-pay

## 2012-04-17 DIAGNOSIS — R079 Chest pain, unspecified: Secondary | ICD-10-CM

## 2012-04-17 DIAGNOSIS — R748 Abnormal levels of other serum enzymes: Secondary | ICD-10-CM

## 2012-04-20 ENCOUNTER — Other Ambulatory Visit: Payer: Self-pay | Admitting: Internal Medicine

## 2012-04-20 ENCOUNTER — Ambulatory Visit (HOSPITAL_COMMUNITY)
Admission: RE | Admit: 2012-04-20 | Discharge: 2012-04-20 | Disposition: A | Discharge status: Home / Self Care | Payer: Medicare Other | Source: Ambulatory Visit | Attending: Physician Assistant | Admitting: Physician Assistant

## 2012-04-20 ENCOUNTER — Telehealth: Payer: Self-pay | Admitting: *Deleted

## 2012-04-20 DIAGNOSIS — R748 Abnormal levels of other serum enzymes: Secondary | ICD-10-CM | POA: Diagnosis not present

## 2012-04-20 DIAGNOSIS — R079 Chest pain, unspecified: Secondary | ICD-10-CM

## 2012-04-20 NOTE — Telephone Encounter (Signed)
lmom abdominal u/s normal negative for gallstone, no changes

## 2012-04-20 NOTE — Telephone Encounter (Signed)
Message copied by Tarri Fuller on Mon Apr 20, 2012  4:11 PM ------      Message from: [place], [place] T      Created: Mon Apr 20, 2012  3:36 PM       Normal abdominal ultrasound.      No gallstones.      Continue with current treatment plan.      Tereso Newcomer, PA-C  3:36 PM 04/20/2012

## 2012-04-21 ENCOUNTER — Ambulatory Visit (HOSPITAL_COMMUNITY): Payer: Medicare Other | Attending: Cardiology | Admitting: Radiology

## 2012-04-21 VITALS — BP 126/78 | Ht 71.0 in | Wt 174.0 lb

## 2012-04-21 DIAGNOSIS — R079 Chest pain, unspecified: Secondary | ICD-10-CM | POA: Diagnosis not present

## 2012-04-21 DIAGNOSIS — I4891 Unspecified atrial fibrillation: Secondary | ICD-10-CM | POA: Diagnosis not present

## 2012-04-21 DIAGNOSIS — R0609 Other forms of dyspnea: Secondary | ICD-10-CM | POA: Insufficient documentation

## 2012-04-21 DIAGNOSIS — I10 Essential (primary) hypertension: Secondary | ICD-10-CM | POA: Diagnosis not present

## 2012-04-21 DIAGNOSIS — R0989 Other specified symptoms and signs involving the circulatory and respiratory systems: Secondary | ICD-10-CM | POA: Insufficient documentation

## 2012-04-21 DIAGNOSIS — Z8249 Family history of ischemic heart disease and other diseases of the circulatory system: Secondary | ICD-10-CM | POA: Diagnosis not present

## 2012-04-21 DIAGNOSIS — R0602 Shortness of breath: Secondary | ICD-10-CM

## 2012-04-21 MED ORDER — TECHNETIUM TC 99M SESTAMIBI GENERIC - CARDIOLITE
10.0000 | Freq: Once | INTRAVENOUS | Status: AC | PRN
  Administered 2012-04-21: 10 via INTRAVENOUS

## 2012-04-21 MED ORDER — REGADENOSON 0.4 MG/5ML IV SOLN
0.4000 mg | Freq: Once | INTRAVENOUS | Status: AC
  Administered 2012-04-21: 0.4 mg via INTRAVENOUS

## 2012-04-21 MED ORDER — TECHNETIUM TC 99M SESTAMIBI GENERIC - CARDIOLITE
30.0000 | Freq: Once | INTRAVENOUS | Status: AC | PRN
  Administered 2012-04-21: 30 via INTRAVENOUS

## 2012-04-21 NOTE — Progress Notes (Signed)
[facility] SITE 3 NUCLEAR MED [id number] [place] [address] [phone]  Cardiology Nuclear Med Study  Virginia Young is a 71 y.o. female     MRN : [id number]     DOB: 07-08-1940  Procedure Date: 04/21/2012  Nuclear Med Background Indication for Stress Test:  Evaluation for Ischemia History:  ICM, AFIB< '03 MPS: (-) ischemia, 12/11 ECHO: EF: 35-40% Cardiac Risk Factors: Family History - CAD, Hypertension and Lipids  Symptoms:  Chest Pain, DOE, Fatigue and SOB   Nuclear Pre-Procedure Caffeine/Decaff Intake:  None NPO After: 6:00am   Lungs:  clear O2 Sat: 96% on room air. IV 0.9% NS with Angio Cath:  20g  IV Site: R Antecubital  IV Started by:  Doyne Keel, CNMT  Chest Size (in):  40 Cup Size: C  Height: 5\' 11"  (1.803 m)  Weight:  174 lb (78.926 kg)  BMI:  Body mass index is 24.27 kg/(m^2). Tech Comments:  Patient took all am meds with water    Nuclear Med Study 1 or 2 day study: 1 day  Stress Test Type:  Lexiscan  Reading MD: Olga Millers, MD  Order Authorizing Provider:  Lovina Reach, MD  Resting Radionuclide: Technetium 32m Sestamibi  Resting Radionuclide Dose: 11.0 mCi   Stress Radionuclide:  Technetium 47m Sestamibi  Stress Radionuclide Dose: 33.0 mCi           Stress Protocol Rest HR: 51 Stress HR: 89  Rest BP: 126/78 Stress BP: 146/74  Exercise Time (min): n/a METS: n/a   Predicted Max HR: 148 bpm % Max HR: 60.14 bpm Rate Pressure Product: 62952   Dose of Adenosine (mg):  n/a Dose of Lexiscan: 0.4 mg  Dose of Atropine (mg): n/a Dose of Dobutamine: n/a mcg/kg/min (at max HR)  Stress Test Technologist: Milana Na, EMT-P  Nuclear Technologist:  Domenic Polite, CNMT     Rest Procedure:  Myocardial perfusion imaging was performed at rest 45 minutes following the intravenous administration of Technetium 27m Sestamibi. Rest ECG: Sinus Bradycardia  Stress Procedure:  The patient received IV Lexiscan 0.4 mg over  15-seconds.  Technetium 41m Sestamibi injected at 30-seconds.  There were no significant changes, warm feeling, weird and flushed with Lexiscan.  Quantitative spect images were obtained after a 45 minute delay. Stress ECG: No significant change from baseline ECG  QPS Raw Data Images:  Acquisition technically good; normal left ventricular size. Stress Images:  There is decreased uptake in the distal anterior wall and apex. Rest Images:  There is decreased uptake in the distal anterior wall and apex. Subtraction (SDS):  No evidence of ischemia. Transient Ischemic Dilatation (Normal <1.22):  1.12 Lung/Heart Ratio (Normal <0.45):  0.46  Quantitative Gated Spect Images QGS EDV:  107 ml QGS ESV:  38 ml  Impression Exercise Capacity:  Lexiscan with no exercise. BP Response:  Normal blood pressure response. Clinical Symptoms:  No chest pain or dyspnea. ECG Impression:  No significant ST segment change suggestive of ischemia. Comparison with Prior Nuclear Study: No images to compare  Overall Impression:  Probably normal stress nuclear study with a small, fixed, mild distal anterior and apical defect suggestive of soft tissue attenuation; no ischemia.  LV Ejection Fraction: 64%.  LV Wall Motion:  NL LV Function; NL Wall Motion   Olga Millers

## 2012-04-22 ENCOUNTER — Telehealth: Payer: Self-pay | Admitting: *Deleted

## 2012-04-22 ENCOUNTER — Encounter: Payer: Self-pay | Admitting: Physician Assistant

## 2012-04-22 NOTE — Telephone Encounter (Signed)
pt notified about myoview results and abdominal u/s negative for gallstones

## 2012-04-22 NOTE — Telephone Encounter (Signed)
Message copied by Tarri Fuller on Wed Apr 22, 2012  2:31 PM ------      Message from: [place], [place] T      Created: Wed Apr 22, 2012  1:38 PM       Low risk       Normal EF      Continue with current treatment plan.      Tereso Newcomer, PA-C  1:38 PM 04/22/2012

## 2012-04-24 ENCOUNTER — Encounter: Payer: Self-pay | Admitting: Internal Medicine

## 2012-05-12 ENCOUNTER — Ambulatory Visit (INDEPENDENT_AMBULATORY_CARE_PROVIDER_SITE_OTHER): Payer: Medicare Other | Admitting: General Practice

## 2012-05-12 DIAGNOSIS — Z7901 Long term (current) use of anticoagulants: Secondary | ICD-10-CM

## 2012-05-12 DIAGNOSIS — I4891 Unspecified atrial fibrillation: Secondary | ICD-10-CM | POA: Diagnosis not present

## 2012-05-12 LAB — POCT INR: INR: 2.4

## 2012-05-15 ENCOUNTER — Ambulatory Visit (INDEPENDENT_AMBULATORY_CARE_PROVIDER_SITE_OTHER): Payer: Medicare Other | Admitting: Physician Assistant

## 2012-05-15 ENCOUNTER — Encounter: Payer: Self-pay | Admitting: Physician Assistant

## 2012-05-15 VITALS — BP 139/78 | HR 54 | Ht 71.0 in | Wt 178.4 lb

## 2012-05-15 DIAGNOSIS — I4891 Unspecified atrial fibrillation: Secondary | ICD-10-CM | POA: Diagnosis not present

## 2012-05-15 DIAGNOSIS — M549 Dorsalgia, unspecified: Secondary | ICD-10-CM | POA: Diagnosis not present

## 2012-05-15 DIAGNOSIS — K219 Gastro-esophageal reflux disease without esophagitis: Secondary | ICD-10-CM | POA: Diagnosis not present

## 2012-05-15 DIAGNOSIS — R079 Chest pain, unspecified: Secondary | ICD-10-CM

## 2012-05-15 NOTE — Progress Notes (Signed)
[address] [place], [place]  [address] Phone: [phone], Fax:  [phone]  Date:  05/15/2012   Name:  Virginia Young   DOB:  July 08, 1940   MRN:  [id number]  PCP:  Sonda Primes, MD  Primary Cardiologist:  Dr. Dietrich Pates  Primary Electrophysiologist:  None    History of Present Illness: Virginia Young is a 71 y.o. female who returns for f/u on chest pain.  She has a hx of AFib, maintaining NSR on amiodarone, mild LV dysfunction (EF in the past 40-45%) thought to be nonischemic with a negative Cardiolite in 2003, HTN, HL. Last echocardiogram 12/11: EF 35-40%, mild MR, mild LAE, mild RAE, small pericardial effusion.  Last seen by Dr. Tenny Craw 6/13.  I saw her last month with complaints of right-sided chest pain. He symptoms were somewhat atypical. She was having ongoing chest pain the office and I checked a troponin which was normal. I had her restart her PPI.  She was set up for a [employer]. This demonstrated soft tissue attenuation but no ischemia and normal LV function. Alkaline phosphatase was minimally elevated and she was set up for abdominal ultrasound which was normal.  CXR was ordered but she did not go for it.  She is feeling better.  Has some low back pain.  But, no further chest pain.  Chronic DOE is unchanged.  No syncope.  No orthopnea, PND, edema.    Labs (2/12:      LDL 65 Labs (12/12):   K 4.5, creatinine 1.0, ALT 24 Labs (6/13):     Hgb 13.8, TSH 0.98 Labs (10/13):   K 3.8, creatinine 0.8, LDL 113, Hgb 14.2, TSH 1.16, BNP 104, ALT 22, TnI  < 0.01  Wt Readings from Last 3 Encounters:  05/15/12 178 lb 6.4 oz (80.922 kg)  04/21/12 174 lb (78.926 kg)  04/14/12 176 lb 12.8 oz (80.196 kg)     Past Medical History  Diagnosis Date  . NICM (nonischemic cardiomyopathy)     a. neg CLite in 2003;  b. EF 40-45% in past;   c.  Echo 12/11: EF 35-40%, mild MR, mild LAE, mild RAE, small pericardial effusion   . Mild mitral regurgitation     . HLD (hyperlipidemia)   . Chronic systolic heart failure   . Atrial fibrillation     a. coumadin;  b. Amiodarone  . Stasis ulcer   . Absence of menstruation   . Family history of ischemic heart disease   . Varicose veins   . Depression   . Osteoarthritis   . Chest pain 03/2012    a. Lex MV 10/13:  EF 64%, dist ant and apical defect sugg of soft tissue atten, no ischemia    Current Outpatient Prescriptions  Medication Sig Dispense Refill  . amiodarone (PACERONE) 200 MG tablet take 1 tablet by mouth once daily  30 tablet  5  . Cholecalciferol (VITAMIN D3) 2000 UNITS TABS Take 1 tablet by mouth daily.  100 tablet  3  . clotrimazole-betamethasone (LOTRISONE) cream Apply topically as directed.  45 g  3  . conjugated estrogens (PREMARIN) vaginal cream Place 1 g vaginally as directed. Use 1 g pv at qhs x 3 days then qod x 6 days then weekly       . donepezil (ARICEPT) 5 MG tablet Take 1 tablet (5 mg total) by mouth at bedtime as needed.  30 tablet  11  . omeprazole (PRILOSEC) 40 MG capsule Take  40 mg by mouth as needed.      Marland Kitchen oxybutynin (DITROPAN) 5 MG tablet Take 5 mg by mouth as needed.      . senna (SENOKOT) 8.6 MG tablet Take 1 tablet by mouth as needed.        . warfarin (COUMADIN) 5 MG tablet Take as directed by [facility]  40 each  5  . OXYQUINOLONE SULFATE VAGINAL 0.025 % GEL As needed        Allergies: Allergies  Allergen Reactions  . Lipitor (Atorvastatin Calcium)     myalgia    Social History:   The patient  reports that she has never smoked. She has never used smokeless tobacco. She reports that she does not drink alcohol or use illicit drugs.    PHYSICAL EXAM: VS:  BP 139/78  Pulse 54  Ht 5\' 11"  (1.803 m)  Wt 178 lb 6.4 oz (80.922 kg)  BMI 24.88 kg/m2 Well nourished, well developed, in no acute distress HEENT: normal Neck: no JVD Cardiac:  normal S1, S2; RRR; no murmur; no rub Lungs:  clear to auscultation bilaterally, no wheezing, rhonchi or rales Abd:  soft, nontender, no hepatomegaly Ext: no edema Skin: warm and dry Neuro:  CNs 2-12 intact, no focal abnormalities noted  EKG:  Sinus brady, HR 52 PRWP, no acute changes     ASSESSMENT AND PLAN:  1. Chest Pain:   Resolved.  Myoview was normal.  This was likely MSK or GI related.  No further workup .    2. GERD:   Remain on PPI.    3. Chronic Systolic CHF:   Volume stable.  EF now normal by recent nuclear scan.   4. Atrial Fibrillation:   Maintaining NSR on amiodarone.  Recent TSH, LFTs ok.  5. Back Pain:  Dr. Tenny Craw saw patient today and gave her names of some PTs she can try to see for her pain.   Signed, Tereso Newcomer, PA-C  10:45 AM 05/15/2012

## 2012-05-15 NOTE — Patient Instructions (Addendum)
NO CHANGES WERE MADE TODAY  Your physician wants you to follow-up in: 6 MONTHS WITH DR. ROSS. You will receive a reminder letter in the mail two months in advance. If you don't receive a letter, please call our office to schedule the follow-up appointment.  

## 2012-05-18 DIAGNOSIS — R319 Hematuria, unspecified: Secondary | ICD-10-CM | POA: Diagnosis not present

## 2012-05-18 DIAGNOSIS — N8182 Incompetence or weakening of pubocervical tissue: Secondary | ICD-10-CM | POA: Diagnosis not present

## 2012-05-18 DIAGNOSIS — N950 Postmenopausal bleeding: Secondary | ICD-10-CM | POA: Diagnosis not present

## 2012-05-18 DIAGNOSIS — N859 Noninflammatory disorder of uterus, unspecified: Secondary | ICD-10-CM | POA: Diagnosis not present

## 2012-05-18 DIAGNOSIS — R300 Dysuria: Secondary | ICD-10-CM | POA: Diagnosis not present

## 2012-06-25 ENCOUNTER — Other Ambulatory Visit: Payer: Self-pay | Admitting: Internal Medicine

## 2012-06-28 ENCOUNTER — Other Ambulatory Visit: Payer: Self-pay | Admitting: Internal Medicine

## 2012-07-02 ENCOUNTER — Ambulatory Visit (INDEPENDENT_AMBULATORY_CARE_PROVIDER_SITE_OTHER): Payer: Medicare Other | Admitting: Pharmacist

## 2012-07-02 DIAGNOSIS — Z7901 Long term (current) use of anticoagulants: Secondary | ICD-10-CM | POA: Diagnosis not present

## 2012-07-02 DIAGNOSIS — I4891 Unspecified atrial fibrillation: Secondary | ICD-10-CM | POA: Diagnosis not present

## 2012-07-02 LAB — POCT INR: INR: 1.7

## 2012-07-08 ENCOUNTER — Other Ambulatory Visit: Payer: Self-pay | Admitting: Internal Medicine

## 2012-08-05 ENCOUNTER — Other Ambulatory Visit: Payer: Self-pay | Admitting: Internal Medicine

## 2012-08-12 ENCOUNTER — Ambulatory Visit (INDEPENDENT_AMBULATORY_CARE_PROVIDER_SITE_OTHER): Payer: Medicare Other | Admitting: *Deleted

## 2012-08-12 DIAGNOSIS — I4891 Unspecified atrial fibrillation: Secondary | ICD-10-CM | POA: Diagnosis not present

## 2012-08-12 DIAGNOSIS — Z7901 Long term (current) use of anticoagulants: Secondary | ICD-10-CM

## 2012-08-12 LAB — POCT INR: INR: 2.9

## 2012-08-18 DIAGNOSIS — R350 Frequency of micturition: Secondary | ICD-10-CM | POA: Diagnosis not present

## 2012-08-18 DIAGNOSIS — N8182 Incompetence or weakening of pubocervical tissue: Secondary | ICD-10-CM | POA: Diagnosis not present

## 2012-08-18 DIAGNOSIS — R300 Dysuria: Secondary | ICD-10-CM | POA: Diagnosis not present

## 2012-08-18 DIAGNOSIS — R319 Hematuria, unspecified: Secondary | ICD-10-CM | POA: Diagnosis not present

## 2012-09-09 ENCOUNTER — Telehealth: Payer: Self-pay | Admitting: *Deleted

## 2012-09-09 ENCOUNTER — Encounter: Payer: Self-pay | Admitting: Physician Assistant

## 2012-09-09 NOTE — Telephone Encounter (Signed)
Pt called to cancel appt and  tried to reschedule but she did not want to reschedule appt will call next week when weather improves. Stressed to her to call and reschedule appt as she will need to be seen next week and she stated understanding

## 2012-09-10 ENCOUNTER — Ambulatory Visit (INDEPENDENT_AMBULATORY_CARE_PROVIDER_SITE_OTHER): Payer: Medicare Other

## 2012-09-10 DIAGNOSIS — Z7901 Long term (current) use of anticoagulants: Secondary | ICD-10-CM

## 2012-09-10 DIAGNOSIS — I4891 Unspecified atrial fibrillation: Secondary | ICD-10-CM

## 2012-09-10 LAB — POCT INR: INR: 2

## 2012-09-17 DIAGNOSIS — R319 Hematuria, unspecified: Secondary | ICD-10-CM | POA: Diagnosis not present

## 2012-09-17 DIAGNOSIS — R300 Dysuria: Secondary | ICD-10-CM | POA: Diagnosis not present

## 2012-09-21 ENCOUNTER — Telehealth: Payer: Self-pay | Admitting: *Deleted

## 2012-09-21 ENCOUNTER — Encounter: Payer: Self-pay | Admitting: Physician Assistant

## 2012-09-21 ENCOUNTER — Ambulatory Visit (INDEPENDENT_AMBULATORY_CARE_PROVIDER_SITE_OTHER): Payer: Medicare Other | Admitting: Physician Assistant

## 2012-09-21 VITALS — BP 140/70 | HR 70 | Ht 69.0 in | Wt 179.8 lb

## 2012-09-21 DIAGNOSIS — R1031 Right lower quadrant pain: Secondary | ICD-10-CM | POA: Diagnosis not present

## 2012-09-21 DIAGNOSIS — Z1211 Encounter for screening for malignant neoplasm of colon: Secondary | ICD-10-CM | POA: Diagnosis not present

## 2012-09-21 MED ORDER — MOVIPREP 100 G PO SOLR: 1.0000 | Freq: Once | ORAL | Status: DC

## 2012-09-21 NOTE — Patient Instructions (Addendum)
You have been scheduled for a colonoscopy with propofol. Please follow written instructions given to you at your visit today.  Please pick up your prep kit at the pharmacy within the next 1-3 days. Pharmacy: [address] If you use inhalers (even only as needed), please bring them with you on the day of your procedure. We will call you after we get a response from Dr. Tenny Craw regarding the Coumadin.

## 2012-09-21 NOTE — Progress Notes (Signed)
Subjective:    Patient ID: Virginia Young, female    DOB: 23-Jun-1941, 72 y.o.   MRN: [id number]  HPI Virginia Young is a very nice 72 year old white female who comes in today to discuss colonoscopy. She is new to GI and states that she had some sort of colon exam about 30 years ago but has not had any evaluation since. She has been having problems with right lower quadrant pain over the past several months and is currently being treated for a UTI. She says the antibiotics have helped but really have not alleviated her right lower quadrant pain. She also says she hurts with change of position and sitting etc. and that the pain may be in her hip. Her appetite has been fine her weight has been stable she has no upper GI complaints. She does have chronic problems with constipation and usually uses over-the-counter laxatives as needed she has not noted any melena or hematochezia. She has not noted any changes in her bowel habits recently. Family history is negative for colon cancer Patient does have history of chronic atrial fibrillation and is maintained on Coumadin, followed by our Coumadin clinic. Her cardiologist is Dr.Paula Ross. She also has history of a nonischemic cardiomyopathy and congestive heart failure with EF estimated at 40-45%. She has hypertension hyperlipidemia and chronic GERD. She is on Aricept for memory loss.    Review of Systems  Constitutional: Negative.   HENT: Negative.   Eyes: Negative.   Respiratory: Negative.   Cardiovascular: Negative.   Gastrointestinal: Positive for abdominal pain.  Endocrine: Negative.   Genitourinary: Positive for dysuria.  Allergic/Immunologic: Negative.   Neurological: Negative.   Hematological: Negative.   Psychiatric/Behavioral: Negative.    Outpatient Prescriptions Prior to Visit  Medication Sig Dispense Refill  . amiodarone (PACERONE) 200 MG tablet take 1 tablet by mouth once daily  30 tablet  5  . amiodarone (PACERONE) 200 MG tablet take 1  tablet by mouth once daily  30 tablet  5  . Cholecalciferol (VITAMIN D3) 2000 UNITS TABS Take 1 tablet by mouth daily.  100 tablet  3  . clotrimazole-betamethasone (LOTRISONE) cream Apply topically as directed.  45 g  3  . conjugated estrogens (PREMARIN) vaginal cream Place 1 g vaginally as directed. Use 1 g pv at qhs x 3 days then qod x 6 days then weekly       . donepezil (ARICEPT) 5 MG tablet take 1 tablet by mouth at bedtime  30 tablet  5  . omeprazole (PRILOSEC) 40 MG capsule take 1 capsule by mouth once daily  30 capsule  3  . OXYQUINOLONE SULFATE VAGINAL 0.025 % GEL As needed      . warfarin (COUMADIN) 5 MG tablet Take as directed by Coumadin Clinic  40 each  5  . oxybutynin (DITROPAN) 5 MG tablet Take 5 mg by mouth as needed.      . senna (SENOKOT) 8.6 MG tablet Take 1 tablet by mouth as needed.         No facility-administered medications prior to visit.   Allergies  Allergen Reactions  . Lipitor (Atorvastatin Calcium)     myalgia   Patient Active Problem List  Diagnosis  . PURE HYPERCHOLESTEROLEMIA  . DYSLIPIDEMIA  . DEPRESSION  . DISEASE, MITRAL VALVE NEC/NOS  . MITRAL REGURGITATION, MILD  . CARDIOMYOPATHY  . Atrial fibrillation  . CONGESTIVE HEART FAILURE  . STASIS ULCER  . VENOUS INSUFFICIENCY, LEGS  . GERD  . VAGINITIS  . ABSENCE,  MENSTRUATION  . OSTEOARTHRITIS  . KNEE PAIN  . FOOT PAIN  . Long term current use of anticoagulant  . OVERACTIVE BLADDER  . Lipoma of neck  . Memory loss  . Right groin mass  . LBP (low back pain)   History  Substance Use Topics  . Smoking status: Never Smoker   . Smokeless tobacco: Never Used  . Alcohol Use: No   family history includes CAD in an unspecified family member; Diabetes (age of onset: 57) in her father; and Heart attack in her brother, father, and sister.  There is no history of Colon cancer.     Objective:   Physical Exam well-developed elderly white female in no acute distress, pleasant blood pressure 140/70  pulse 70 height 5 foot 9 weight 179. HEENT; nontraumatic normocephalic EOMI PERRLA sclera anicteric,Neck; Supple no JVD, Cardiovascular irregular rate and rhythm with S1-S2, soft murmur, Pulmonary; clear bilaterally, Abdomen; soft , nondistended, she has mild tenderness in the right lower quadrant there is no guarding or rebound no palpable mass or hepatosplenomegaly bowel sounds are active, Rectal; exam not done, Extremities ;no clubbing cyanosis or edema skin warm and dry, Psych;mood and affect normal and the probe        Assessment & Plan:  #58 72 year old female with several month history of right lower quadrant discomfort of unclear etiology. By history this may be more musculoskeletal but will need to rule out intra-abdominal pathology. #2 colon neoplasia screening-no prior colonoscopy #3 chronic anticoagulation with Coumadin #4 chronic atrial fibrillation #5 nonischemic cardiomyopathy with EF of 40-45% #6 hypertension #7 hyperlipidemia #8 chronic GERD #9 frequent urinary tract infections #10 chronic constipation  Plan; discussed trial of MiraLax 17 g daily in 8 ounces of water for chronic constipation Schedule for colonoscopy with Dr. Alona Bene  was discussed in detail with the patient and she is agreeable to proceed. We will obtain consent from Dr. Tenny Craw for patient to hold her Coumadin for 5 days prior to the procedure. We also discussed relative risk /benefits of holding anticoagulation.  Addendum: Reviewed and agree with initial management. Beverley Fiedler, MD

## 2012-09-21 NOTE — Telephone Encounter (Signed)
I called the patient to advise I called in the free voucher for Moviprep. I gave the pharmacist the BIN and GROUP and ID numbers. I asked he not run the ins and run it as cash.  He did it and it did go through.  I left a message for the patient on her answering machine.

## 2012-09-21 NOTE — Telephone Encounter (Signed)
I called Dimas Alexandria the patient daughter at work.  I asked if she could let her mother know that I called in a free voucher for the Moviprep to [facility].  She can pick it up there.  Afra Tricarico said she would let her mother know.

## 2012-09-22 ENCOUNTER — Telehealth: Payer: Self-pay | Admitting: *Deleted

## 2012-09-22 NOTE — Telephone Encounter (Signed)
Message copied by Derry Skill on Tue Sep 22, 2012  8:59 AM ------      Message from: Dietrich Pates V      Created: Tue Sep 22, 2012  8:30 AM       Patient is OK to stop coumadin 5 days before procedure.  Resume after.      ----- Message -----         From: Derry Skill, CMA         Sent: 09/21/2012  11:26 AM           To: Pricilla Riffle, MD            09/21/2012                        RE: Virginia Young      DOB: [birth year]      MRN: [id number]                  Dear Dr. Dietrich Pates,                   We have scheduled the above patient for an endoscopic procedure. Our records show that she is on anticoagulation therapy.             Please advise as to how long the patient may come off her therapy of Coumadin prior to the procedure, which is scheduled for 09-30-2012.            Please fax back/ or route the completed form to [facility] CMA at [phone]#.             Sincerely,                  Ashby Dawes CMA                        Amy Esterwood PA-C              ------

## 2012-09-22 NOTE — Telephone Encounter (Signed)
Message copied by Derry Skill on Tue Sep 22, 2012  9:00 AM ------      Message from: Dietrich Pates V      Created: Tue Sep 22, 2012  8:30 AM       Patient is OK to stop coumadin 5 days before procedure.  Resume after.      ----- Message -----         From: Derry Skill, CMA         Sent: 09/21/2012  11:26 AM           To: Pricilla Riffle, MD            09/21/2012                        RE: Virginia Young      DOB: 01-11-1941      MRN: [id number]                  Dear Dr. Dietrich Pates,                   We have scheduled the above patient for an endoscopic procedure. Our records show that she is on anticoagulation therapy.             Please advise as to how long the patient may come off her therapy of Coumadin prior to the procedure, which is scheduled for 09-30-2012.            Please fax back/ or route the completed form to [facility] CMA at [phone]#.             Sincerely,                  Ashby Dawes CMA                        Amy Esterwood PA-C              ------

## 2012-09-22 NOTE — Telephone Encounter (Signed)
Called pt to advise her that Dr. Dietrich Pates said it is OK for her to stop the Coumadin 5 days prior to the procedure.  She can stop it on 09-25-2012 and resume it the day after the procedure.  Reminded the pt the colonoscopy is scheduled for 09-30-2012.

## 2012-09-22 NOTE — Telephone Encounter (Signed)
Recei

## 2012-09-30 ENCOUNTER — Encounter: Payer: Self-pay | Admitting: Internal Medicine

## 2012-09-30 ENCOUNTER — Ambulatory Visit (AMBULATORY_SURGERY_CENTER): Payer: Medicare Other | Admitting: Internal Medicine

## 2012-09-30 VITALS — BP 124/70 | HR 63 | Temp 96.4°F | Resp 31 | Ht 69.0 in | Wt 179.0 lb

## 2012-09-30 DIAGNOSIS — Z1211 Encounter for screening for malignant neoplasm of colon: Secondary | ICD-10-CM

## 2012-09-30 DIAGNOSIS — R1031 Right lower quadrant pain: Secondary | ICD-10-CM

## 2012-09-30 DIAGNOSIS — R079 Chest pain, unspecified: Secondary | ICD-10-CM | POA: Diagnosis not present

## 2012-09-30 DIAGNOSIS — R109 Unspecified abdominal pain: Secondary | ICD-10-CM | POA: Diagnosis not present

## 2012-09-30 DIAGNOSIS — I2510 Atherosclerotic heart disease of native coronary artery without angina pectoris: Secondary | ICD-10-CM | POA: Diagnosis not present

## 2012-09-30 MED ORDER — SODIUM CHLORIDE 0.9 % IV SOLN: 500.0000 mL | INTRAVENOUS | Status: DC

## 2012-09-30 NOTE — Progress Notes (Signed)
A/ox3 pleased with MAC report to Jane RN 

## 2012-09-30 NOTE — Patient Instructions (Addendum)
YOU HAD AN ENDOSCOPIC PROCEDURE TODAY AT THE [facility]: Refer to the procedure report that was given to you for any specific questions about what was found during the examination.  If the procedure report does not answer your questions, please call your gastroenterologist to clarify.  If you requested that your care partner not be given the details of your procedure findings, then the procedure report has been included in a sealed envelope for you to review at your convenience later.  YOU SHOULD EXPECT: Some feelings of bloating in the abdomen. Passage of more gas than usual.  Walking can help get rid of the air that was put into your GI tract during the procedure and reduce the bloating. If you had a lower endoscopy (such as a colonoscopy or flexible sigmoidoscopy) you may notice spotting of blood in your stool or on the toilet paper. If you underwent a bowel prep for your procedure, then you may not have a normal bowel movement for a few days.  DIET: Your first meal following the procedure should be a light meal and then it is ok to progress to your normal diet.  A half-sandwich or bowl of soup is an example of a good first meal.  Heavy or fried foods are harder to digest and may make you feel nauseous or bloated.  Likewise meals heavy in dairy and vegetables can cause extra gas to form and this can also increase the bloating.  Drink plenty of fluids but you should avoid alcoholic beverages for 24 hours.  ACTIVITY: Your care partner should take you home directly after the procedure.  You should plan to take it easy, moving slowly for the rest of the day.  You can resume normal activity the day after the procedure however you should NOT DRIVE or use heavy machinery for 24 hours (because of the sedation medicines used during the test).    SYMPTOMS TO REPORT IMMEDIATELY: A gastroenterologist can be reached at any hour.  During normal business hours, 8:30 AM to 5:00 PM Monday through Friday,  call [phone].  After hours and on weekends, please call the GI answering service at [phone] who will take a message and have the physician on call contact you.   Following lower endoscopy (colonoscopy or flexible sigmoidoscopy):  Excessive amounts of blood in the stool  Significant tenderness or worsening of abdominal pains  Swelling of the abdomen that is new, acute  Fever of 100F or higher    FOLLOW UP: If any biopsies were taken you will be contacted by phone or by letter within the next 1-3 weeks.  Call your gastroenterologist if you have not heard about the biopsies in 3 weeks.  Our staff will call the home number listed on your records the next business day following your procedure to check on you and address any questions or concerns that you may have at that time regarding the information given to you following your procedure. This is a courtesy call and so if there is no answer at the home number and we have not heard from you through the emergency physician on call, we will assume that you have returned to your regular daily activities without incident.  SIGNATURES/CONFIDENTIALITY: You and/or your care partner have signed paperwork which will be entered into your electronic medical record.  These signatures attest to the fact that that the information above on your After Visit Summary has been reviewed and is understood.  Full responsibility of the confidentiality   of this discharge information lies with you and/or your care-partner.  Diverticulosis, high fiber diet, hemorrhoid information given.  Resume coumadin tomorrow.

## 2012-09-30 NOTE — Op Note (Signed)
[facility] [address] [place], [address]   COLONOSCOPY PROCEDURE REPORT  PATIENT: Virginia Young, Virginia Young  MR#: [id number] BIRTHDATE: January 20, 1941 , 71  yrs. old GENDER: Female ENDOSCOPIST: Beverley Fiedler, MD REFERRED NF:AOZH Buckner Malta, M.D. PROCEDURE DATE:  09/30/2012 PROCEDURE:   Colonoscopy, screening ASA CLASS:   Class III INDICATIONS:average risk screening and first colonoscopy. MEDICATIONS: MAC sedation, administered by CRNA and propofol (Diprivan) 200mg  IV  DESCRIPTION OF PROCEDURE:   After the risks benefits and alternatives of the procedure were thoroughly explained, informed consent was obtained.  A digital rectal exam revealed no rectal mass.   The LB [id number]-[id number]  endoscope was introduced through the anus and advanced to the cecum, which was identified by both the appendix and ileocecal valve. No adverse events experienced. The quality of the prep was good, using MoviPrep  The instrument was then slowly withdrawn as the colon was fully examined.    COLON FINDINGS: There was mild diverticulosis noted in the ascending colon and sigmoid colon with associated tortuosity.   The colon was otherwise normal.  There was no inflammation, polyps or cancers unless previously stated.   Moderate sized internal hemorrhoids were found.  Retroflexed views revealed internal hemorrhoids. The time to cecum=11 minutes 52 seconds.  Withdrawal time=11 minutes 22 seconds.  The scope was withdrawn and the procedure completed.  COMPLICATIONS: There were no complications.  ENDOSCOPIC IMPRESSION: 1.   There was mild diverticulosis noted in the ascending colon and sigmoid colon 2.   The colon was otherwise normal 3.   Moderate sized internal hemorrhoids  RECOMMENDATIONS: 1.  High fiber diet 2.  Given your age, you likely will not need another colonoscopy for colon cancer screening or polyp surveillance.  These types of tests usually stop around the age 13. This  decision can be discussed with your primary doctor in about 10 years.   eSigned:  Beverley Fiedler, MD 09/30/2012 9:30 AM   cc: The Patient

## 2012-09-30 NOTE — Progress Notes (Signed)
Patient did not experience any of the following events: a burn prior to discharge; a fall within the facility; wrong site/side/patient/procedure/implant event; or a hospital transfer or hospital admission upon discharge from the facility. (G8907) Patient did not have preoperative order for IV antibiotic SSI prophylaxis. (G8918)  

## 2012-10-01 ENCOUNTER — Telehealth: Payer: Self-pay | Admitting: *Deleted

## 2012-10-01 NOTE — Telephone Encounter (Signed)
  Follow up Call-  Call back number 09/30/2012  Post procedure Call Back phone  # [phone]  Permission to leave phone message Yes     Patient questions:  Do you have a fever, pain , or abdominal swelling? no Pain Score  0 *  Have you tolerated food without any problems? yes  Have you been able to return to your normal activities? yes  Do you have any questions about your discharge instructions: Diet   no Medications  no Follow up visit  no  Do you have questions or concerns about your Care? no  Actions: * If pain score is 4 or above: No action needed, pain <4.

## 2012-10-08 ENCOUNTER — Ambulatory Visit (INDEPENDENT_AMBULATORY_CARE_PROVIDER_SITE_OTHER): Payer: Medicare Other

## 2012-10-08 DIAGNOSIS — I4891 Unspecified atrial fibrillation: Secondary | ICD-10-CM

## 2012-10-08 DIAGNOSIS — Z7901 Long term (current) use of anticoagulants: Secondary | ICD-10-CM | POA: Diagnosis not present

## 2012-10-08 LAB — POCT INR: INR: 1.4

## 2012-10-17 DIAGNOSIS — T45515A Adverse effect of anticoagulants, initial encounter: Secondary | ICD-10-CM | POA: Diagnosis not present

## 2012-10-17 DIAGNOSIS — Z7901 Long term (current) use of anticoagulants: Secondary | ICD-10-CM | POA: Diagnosis not present

## 2012-10-17 DIAGNOSIS — R041 Hemorrhage from throat: Secondary | ICD-10-CM | POA: Diagnosis not present

## 2012-10-20 ENCOUNTER — Ambulatory Visit (INDEPENDENT_AMBULATORY_CARE_PROVIDER_SITE_OTHER): Payer: Medicare Other | Admitting: *Deleted

## 2012-10-20 DIAGNOSIS — Z7901 Long term (current) use of anticoagulants: Secondary | ICD-10-CM | POA: Diagnosis not present

## 2012-10-20 DIAGNOSIS — I4891 Unspecified atrial fibrillation: Secondary | ICD-10-CM | POA: Diagnosis not present

## 2012-10-20 LAB — POCT INR: INR: 1.9

## 2012-10-20 NOTE — Patient Instructions (Signed)
Instructed on anticoagulation safety while out of town at [employer]

## 2012-11-03 ENCOUNTER — Ambulatory Visit (INDEPENDENT_AMBULATORY_CARE_PROVIDER_SITE_OTHER): Payer: Medicare Other | Admitting: *Deleted

## 2012-11-03 DIAGNOSIS — I4891 Unspecified atrial fibrillation: Secondary | ICD-10-CM

## 2012-11-03 DIAGNOSIS — Z7901 Long term (current) use of anticoagulants: Secondary | ICD-10-CM

## 2012-11-03 LAB — POCT INR: INR: 3

## 2012-11-26 DIAGNOSIS — N3941 Urge incontinence: Secondary | ICD-10-CM | POA: Diagnosis not present

## 2012-11-26 DIAGNOSIS — Z0189 Encounter for other specified special examinations: Secondary | ICD-10-CM | POA: Diagnosis not present

## 2012-11-26 DIAGNOSIS — Z0000 Encounter for general adult medical examination without abnormal findings: Secondary | ICD-10-CM | POA: Diagnosis not present

## 2012-11-26 DIAGNOSIS — N390 Urinary tract infection, site not specified: Secondary | ICD-10-CM | POA: Diagnosis not present

## 2012-12-01 ENCOUNTER — Ambulatory Visit (INDEPENDENT_AMBULATORY_CARE_PROVIDER_SITE_OTHER): Payer: Medicare Other | Admitting: *Deleted

## 2012-12-01 DIAGNOSIS — Z7901 Long term (current) use of anticoagulants: Secondary | ICD-10-CM | POA: Diagnosis not present

## 2012-12-01 DIAGNOSIS — I4891 Unspecified atrial fibrillation: Secondary | ICD-10-CM | POA: Diagnosis not present

## 2012-12-01 LAB — POCT INR: INR: 2.2

## 2012-12-14 ENCOUNTER — Telehealth: Payer: Self-pay | Admitting: Internal Medicine

## 2012-12-14 NOTE — Telephone Encounter (Signed)
Tried to contact Dr Tenny Craw, unable to reach. Discussed with Dawayne Patricia NP and it is ok to go ahead and pull tooth since it is in the front. Advised Dorene.

## 2012-12-14 NOTE — Telephone Encounter (Signed)
Per Dorene at Dr Tilman Neat office patients last dose of coumadin on Saturday. It is just one tooth in front. Will forward to Dr Tenny Craw for review

## 2012-12-14 NOTE — Telephone Encounter (Signed)
New problem   Dorene/Dr Darnelle Maffucci stated pt is having an extraction and need to know if pt need to be off coumadin. Please call

## 2012-12-14 NOTE — Telephone Encounter (Signed)
Virginia Young calling back, pt has come in their office, having a lot of pain and they can't help her  until gets the ok from Dr. Tenny Craw, knows she's at the hospital asked if she could be paged to speed things up a bit?

## 2012-12-29 ENCOUNTER — Ambulatory Visit (INDEPENDENT_AMBULATORY_CARE_PROVIDER_SITE_OTHER): Payer: Medicare Other | Admitting: Pharmacist

## 2012-12-29 DIAGNOSIS — I4891 Unspecified atrial fibrillation: Secondary | ICD-10-CM

## 2012-12-29 DIAGNOSIS — Z7901 Long term (current) use of anticoagulants: Secondary | ICD-10-CM

## 2012-12-29 LAB — POCT INR: INR: 2.2

## 2013-01-21 ENCOUNTER — Encounter: Payer: Self-pay | Admitting: Internal Medicine

## 2013-02-01 ENCOUNTER — Ambulatory Visit: Payer: Medicare Other | Admitting: Internal Medicine

## 2013-02-01 ENCOUNTER — Ambulatory Visit (INDEPENDENT_AMBULATORY_CARE_PROVIDER_SITE_OTHER): Payer: Medicare Other | Admitting: *Deleted

## 2013-02-01 DIAGNOSIS — I4891 Unspecified atrial fibrillation: Secondary | ICD-10-CM | POA: Diagnosis not present

## 2013-02-01 DIAGNOSIS — Z7901 Long term (current) use of anticoagulants: Secondary | ICD-10-CM | POA: Diagnosis not present

## 2013-02-01 LAB — POCT INR: INR: 2.6

## 2013-02-04 DIAGNOSIS — R310 Gross hematuria: Secondary | ICD-10-CM | POA: Diagnosis not present

## 2013-02-04 DIAGNOSIS — R319 Hematuria, unspecified: Secondary | ICD-10-CM | POA: Diagnosis not present

## 2013-02-19 ENCOUNTER — Encounter: Payer: Self-pay | Admitting: Internal Medicine

## 2013-02-19 ENCOUNTER — Other Ambulatory Visit (INDEPENDENT_AMBULATORY_CARE_PROVIDER_SITE_OTHER): Payer: Medicare Other

## 2013-02-19 ENCOUNTER — Ambulatory Visit (INDEPENDENT_AMBULATORY_CARE_PROVIDER_SITE_OTHER): Payer: Medicare Other | Admitting: Internal Medicine

## 2013-02-19 VITALS — BP 170/90 | HR 72 | Temp 98.3°F | Resp 16 | Wt 175.0 lb

## 2013-02-19 DIAGNOSIS — H00016 Hordeolum externum left eye, unspecified eyelid: Secondary | ICD-10-CM

## 2013-02-19 DIAGNOSIS — R220 Localized swelling, mass and lump, head: Secondary | ICD-10-CM

## 2013-02-19 DIAGNOSIS — H00019 Hordeolum externum unspecified eye, unspecified eyelid: Secondary | ICD-10-CM | POA: Insufficient documentation

## 2013-02-19 DIAGNOSIS — R221 Localized swelling, mass and lump, neck: Secondary | ICD-10-CM

## 2013-02-19 DIAGNOSIS — M542 Cervicalgia: Secondary | ICD-10-CM

## 2013-02-19 LAB — SEDIMENTATION RATE: Sed Rate: 10 mm/hr (ref 0–22)

## 2013-02-19 LAB — CK: Total CK: 99 U/L (ref 7–177)

## 2013-02-19 MED ORDER — CEPHALEXIN 500 MG PO CAPS: 500.0000 mg | ORAL_CAPSULE | Freq: Four times a day (QID) | ORAL | Status: DC

## 2013-02-19 MED ORDER — TRAMADOL HCL 50 MG PO TABS: 25.0000 mg | ORAL_TABLET | Freq: Two times a day (BID) | ORAL | Status: DC | PRN

## 2013-02-19 NOTE — Assessment & Plan Note (Signed)
8/14 L eye lower eyelid  Keflex x 7 d

## 2013-02-19 NOTE — Assessment & Plan Note (Signed)
8/14 L posterior c/w lipoma [place]

## 2013-02-19 NOTE — Assessment & Plan Note (Signed)
8/14 MSK CK, ESR - r/o PMR Heat and massage

## 2013-02-19 NOTE — Progress Notes (Signed)
Subjective:     HPI C/o L eye pain x days C/o neck/B shoulders pain and a growth  The patient presents for a follow-up of  chronic hypertension, A fib and chronic dyslipidemia, controlled with medicines.  F/u stiffness in LS spine and arthralgias - much better off Lipitor C/o memory issues; got lost a few times xmonths - no change   BP Readings from Last 3 Encounters:  02/19/13 170/90  09/30/12 124/70  09/21/12 140/70   Wt Readings from Last 3 Encounters:  02/19/13 175 lb (79.379 kg)  09/30/12 179 lb (81.194 kg)  09/21/12 179 lb 12.8 oz (81.557 kg)      Review of Systems  Constitutional: Negative for activity change, appetite change, fatigue and unexpected weight change.  HENT: Negative for congestion, mouth sores and sinus pressure.   Eyes: Negative for visual disturbance.  Respiratory: Negative for cough and chest tightness.   Genitourinary: Negative for difficulty urinating and vaginal pain.  Musculoskeletal: Negative for gait problem.  Skin: Negative for pallor.  Neurological: Negative for dizziness, tremors, weakness and numbness.  Psychiatric/Behavioral: Positive for behavioral problems and decreased concentration. Negative for suicidal ideas, confusion and sleep disturbance. The patient is nervous/anxious.        Objective:   Physical Exam  Constitutional: She is oriented to person, place, and time. She appears well-developed and well-nourished. No distress.  HENT:  Head: Normocephalic.  Right Ear: External ear normal.  Left Ear: External ear normal.  Nose: Nose normal.  Mouth/Throat: Oropharynx is clear and moist.  Eyes: Conjunctivae are normal. Pupils are equal, round, and reactive to light. Right eye exhibits no discharge. Left eye exhibits no discharge.  Neck: Normal range of motion. Neck supple. No JVD present. No tracheal deviation present. No thyromegaly present.  Cardiovascular: Normal rate and normal heart sounds.   Pulmonary/Chest: No stridor. No  respiratory distress. She has no wheezes.  Abdominal: Soft. Bowel sounds are normal. She exhibits no distension and no mass. There is no tenderness. There is no rebound and no guarding.  R groin w/less fullness, not  tender  Musculoskeletal: She exhibits edema (trace B) and tenderness (LS spine L>>R).  Lymphadenopathy:    She has no cervical adenopathy.  Neurological: She is alert and oriented to person, place, and time. She displays normal reflexes. No cranial nerve deficit. She exhibits normal muscle tone. Coordination (mild) abnormal.  Skin: No rash noted. No erythema.  A lot of veins on LEs  Psychiatric: She has a normal mood and affect. Her behavior is normal. Judgment and thought content normal.  8/14 L eye lower eyelid  3x2 cm oval lump at the L post neck base  Lab Results  Component Value Date   WBC 9.5 04/14/2012   HGB 14.2 04/14/2012   HCT 43.4 04/14/2012   PLT 270.0 04/14/2012   GLUCOSE 91 04/14/2012   CHOL 190 04/14/2012   TRIG 64.0 04/14/2012   HDL 64.40 04/14/2012   LDLCALC 113* 04/14/2012   ALT 22 04/14/2012   AST 23 04/14/2012   NA 136 04/14/2012   K 3.8 04/14/2012   CL 99 04/14/2012   CREATININE 0.8 04/14/2012   BUN 14 04/14/2012   CO2 31 04/14/2012   TSH 1.16 04/14/2012   INR 2.6 02/01/2013    Procedure Note :    Procedure :   Point of care (POC) sonography examination   Indication: L post neck base mass   Equipment used: Sonosite M-Turbo with HFL38x/13-6 MHz transducer linear probe. The images were  stored in the unit and later transferred in storage.  The patient was placed in a decubitus position.  This study revealed a hypoechoic      cm lesion in   Impression: L post neck base mass c/w lipoma          Assessment & Plan:

## 2013-03-11 ENCOUNTER — Other Ambulatory Visit: Payer: Self-pay | Admitting: Internal Medicine

## 2013-03-17 ENCOUNTER — Other Ambulatory Visit: Payer: Self-pay | Admitting: Internal Medicine

## 2013-03-18 DIAGNOSIS — N898 Other specified noninflammatory disorders of vagina: Secondary | ICD-10-CM | POA: Diagnosis not present

## 2013-03-18 DIAGNOSIS — N8189 Other female genital prolapse: Secondary | ICD-10-CM | POA: Diagnosis not present

## 2013-03-18 DIAGNOSIS — R82998 Other abnormal findings in urine: Secondary | ICD-10-CM | POA: Diagnosis not present

## 2013-03-18 DIAGNOSIS — N390 Urinary tract infection, site not specified: Secondary | ICD-10-CM | POA: Diagnosis not present

## 2013-03-22 ENCOUNTER — Ambulatory Visit (INDEPENDENT_AMBULATORY_CARE_PROVIDER_SITE_OTHER): Payer: Medicare Other | Admitting: *Deleted

## 2013-03-22 ENCOUNTER — Encounter: Payer: Self-pay | Admitting: Internal Medicine

## 2013-03-22 ENCOUNTER — Ambulatory Visit (INDEPENDENT_AMBULATORY_CARE_PROVIDER_SITE_OTHER): Payer: Medicare Other | Admitting: Internal Medicine

## 2013-03-22 VITALS — BP 142/86 | HR 45 | Ht 69.0 in | Wt 177.8 lb

## 2013-03-22 DIAGNOSIS — I428 Other cardiomyopathies: Secondary | ICD-10-CM | POA: Diagnosis not present

## 2013-03-22 DIAGNOSIS — Z7901 Long term (current) use of anticoagulants: Secondary | ICD-10-CM

## 2013-03-22 DIAGNOSIS — E785 Hyperlipidemia, unspecified: Secondary | ICD-10-CM

## 2013-03-22 DIAGNOSIS — I4891 Unspecified atrial fibrillation: Secondary | ICD-10-CM

## 2013-03-22 DIAGNOSIS — R0602 Shortness of breath: Secondary | ICD-10-CM

## 2013-03-22 LAB — CBC WITH DIFFERENTIAL/PLATELET
Basophils Absolute: 0.1 10*3/uL (ref 0.0–0.1)
Basophils Relative: 0.7 % (ref 0.0–3.0)
Eosinophils Absolute: 0.2 10*3/uL (ref 0.0–0.7)
Eosinophils Relative: 1.9 % (ref 0.0–5.0)
HCT: 41.4 % (ref 36.0–46.0)
Hemoglobin: 13.8 g/dL (ref 12.0–15.0)
Lymphocytes Relative: 22 % (ref 12.0–46.0)
Lymphs Abs: 1.9 10*3/uL (ref 0.7–4.0)
MCHC: 33.2 g/dL (ref 30.0–36.0)
MCV: 88 fl (ref 78.0–100.0)
Monocytes Absolute: 0.4 10*3/uL (ref 0.1–1.0)
Monocytes Relative: 5 % (ref 3.0–12.0)
Neutro Abs: 6.2 10*3/uL (ref 1.4–7.7)
Neutrophils Relative %: 70.4 % (ref 43.0–77.0)
Platelets: 236 10*3/uL (ref 150.0–400.0)
RBC: 4.71 Mil/uL (ref 3.87–5.11)
RDW: 14.9 % — ABNORMAL HIGH (ref 11.5–14.6)
WBC: 8.8 10*3/uL (ref 4.5–10.5)

## 2013-03-22 LAB — BASIC METABOLIC PANEL
BUN: 13 mg/dL (ref 6–23)
CO2: 32 mEq/L (ref 19–32)
Calcium: 9.1 mg/dL (ref 8.4–10.5)
Chloride: 101 mEq/L (ref 96–112)
Creatinine, Ser: 0.8 mg/dL (ref 0.4–1.2)
GFR: 75.78 mL/min (ref >60.00)
Glucose, Bld: 96 mg/dL (ref 70–99)
Potassium: 4.1 mEq/L (ref 3.5–5.1)
Sodium: 137 mEq/L (ref 135–145)

## 2013-03-22 LAB — HEPATIC FUNCTION PANEL
ALT: 22 U/L (ref 0–35)
AST: 22 U/L (ref 0–37)
Albumin: 3.9 g/dL (ref 3.5–5.2)
Alkaline Phosphatase: 107 U/L (ref 39–117)
Bilirubin, Direct: 0.1 mg/dL (ref 0.0–0.3)
Total Bilirubin: 0.6 mg/dL (ref 0.3–1.2)
Total Protein: 6.7 g/dL (ref 6.0–8.3)

## 2013-03-22 LAB — LIPID PANEL
Cholesterol: 160 mg/dL (ref 0–200)
HDL: 63.1 mg/dL (ref >39.00)
LDL Cholesterol: 79 mg/dL (ref 0–99)
Total CHOL/HDL Ratio: 3
Triglycerides: 89 mg/dL (ref 0.0–149.0)
VLDL: 17.8 mg/dL (ref 0.0–40.0)

## 2013-03-22 LAB — POCT INR: INR: 1.9

## 2013-03-22 LAB — TSH: TSH: 0.92 u[IU]/mL (ref 0.35–5.50)

## 2013-03-22 NOTE — Progress Notes (Signed)
.  Patient is a 38 yow with a history of afib (on amio), mild LV dysfunction (felt nonischemic with neg cardiolite 2003), HTN, HL.  Last echo 2011  LVEF 35 to 40% Patient was last in clinic in Fall 2013.  She had a myoview prior for atypical CP  It showed no ischemia  Since seen she has done OK  Denies siginif CP  No palpitations.  Breathing is stable.     Wt Readings from Last 3 Encounters:  03/22/13 177 lb 12.8 oz (80.65 kg)  02/19/13 175 lb (79.379 kg)  09/30/12 179 lb (81.194 kg)     Past Medical History  Diagnosis Date  . NICM (nonischemic cardiomyopathy)     a. neg CLite in 2003;  b. EF 40-45% in past;   c.  Echo 12/11: EF 35-40%, mild MR, mild LAE, mild RAE, small pericardial effusion   . Mild mitral regurgitation   . HLD (hyperlipidemia)   . Chronic systolic heart failure   . Atrial fibrillation     a. coumadin;  b. Amiodarone  . Stasis ulcer   . Absence of menstruation   . Family history of ischemic heart disease   . Varicose veins   . Depression   . Osteoarthritis   . Chest pain 03/2012    a. Lex MV 10/13:  EF 64%, dist ant and apical defect sugg of soft tissue atten, no ischemia  . Cataract   . GERD (gastroesophageal reflux disease)     Current Outpatient Prescriptions  Medication Sig Dispense Refill  . acetaminophen (TYLENOL) 500 MG tablet Take 500 mg by mouth every 6 (six) hours as needed for pain. Take as needed for pain      . amiodarone (PACERONE) 200 MG tablet take 1 tablet by mouth once daily  30 tablet  5  . amiodarone (PACERONE) 200 MG tablet take 1 tablet by mouth once daily  30 tablet  5  . cephALEXin (KEFLEX) 500 MG capsule Take 1 capsule (500 mg total) by mouth 4 (four) times daily.  28 capsule  0  . Cholecalciferol (VITAMIN D3) 2000 UNITS TABS Take 1 tablet by mouth daily.  100 tablet  3  . clotrimazole-betamethasone (LOTRISONE) cream Apply topically as directed.  45 g  3  . conjugated estrogens (PREMARIN) vaginal cream Place 1 g vaginally as  directed. Use 1 g pv at qhs x 3 days then qod x 6 days then weekly       . COUMADIN 5 MG tablet take as directed BY COUMADIN CLINIC  40 tablet  3  . donepezil (ARICEPT) 5 MG tablet take 1 tablet by mouth at bedtime  30 tablet  5  . omeprazole (PRILOSEC) 40 MG capsule take 1 capsule by mouth once daily  30 capsule  3  . OXYQUINOLONE SULFATE VAGINAL 0.025 % GEL As needed      . traMADol (ULTRAM) 50 MG tablet Take 0.5-1 tablets (25-50 mg total) by mouth 2 (two) times daily as needed for pain.  60 tablet  3   No current facility-administered medications for this visit.    Allergies: Allergies  Allergen Reactions  . Lipitor [Atorvastatin Calcium]     myalgia    Social History:   The patient  reports that she has never smoked. She has never used smokeless tobacco. She reports that she does not drink alcohol or use illicit drugs.    PHYSICAL EXAM: VS:  BP 142/86  Pulse 45  Ht 5\' 9"  (1.753 m)  Wt 177 lb 12.8 oz (80.65 kg)  BMI 26.24 kg/m2 Well nourished, well developed, in no acute distress HEENT: normal Neck: no JVD Cardiac:  normal S1, S2; RRR; no murmur; no rub Lungs:  clear to auscultation bilaterally, no wheezing, rhonchi or rales Abd: soft, nontender, no hepatomegaly Ext: no edema Skin: warm and dry Neuro:  CNs 2-12 intact, no focal abnormalities noted     ASSESSMENT AND PLAN:  1. Atrial fib  Clinically appears to be in SR  Check labs   2. Chronic Systolic CHF:   Volume stable. Will set up for echo  3.  CP  Denies

## 2013-03-22 NOTE — Patient Instructions (Addendum)
**Note De-Identified  Obfuscation** Your physician recommends that you continue on your current medications as directed. Please refer to the Current Medication list given to you today.  Your physician recommends that you return for lab work in: today  Your physician has requested that you have an echocardiogram. Echocardiography is a painless test that uses sound waves to create images of your heart. It provides your doctor with information about the size and shape of your heart and how well your heart's chambers and valves are working. This procedure takes approximately one hour. There are no restrictions for this procedure.  Your physician wants you to follow-up in: 1 year. You will receive a reminder letter in the mail two months in advance. If you don't receive a letter, please call our office to schedule the follow-up appointment.

## 2013-03-23 ENCOUNTER — Ambulatory Visit (INDEPENDENT_AMBULATORY_CARE_PROVIDER_SITE_OTHER): Payer: Medicare Other | Admitting: Internal Medicine

## 2013-03-23 ENCOUNTER — Encounter: Payer: Self-pay | Admitting: Internal Medicine

## 2013-03-23 ENCOUNTER — Encounter: Payer: Self-pay | Admitting: *Deleted

## 2013-03-23 VITALS — BP 132/90 | HR 59 | Temp 96.6°F | Wt 177.4 lb

## 2013-03-23 DIAGNOSIS — Z23 Encounter for immunization: Secondary | ICD-10-CM | POA: Diagnosis not present

## 2013-03-23 DIAGNOSIS — I4891 Unspecified atrial fibrillation: Secondary | ICD-10-CM | POA: Diagnosis not present

## 2013-03-23 DIAGNOSIS — H00016 Hordeolum externum left eye, unspecified eyelid: Secondary | ICD-10-CM

## 2013-03-23 DIAGNOSIS — H00019 Hordeolum externum unspecified eye, unspecified eyelid: Secondary | ICD-10-CM

## 2013-03-23 DIAGNOSIS — F329 Major depressive disorder, single episode, unspecified: Secondary | ICD-10-CM

## 2013-03-23 DIAGNOSIS — I509 Heart failure, unspecified: Secondary | ICD-10-CM

## 2013-03-23 DIAGNOSIS — M542 Cervicalgia: Secondary | ICD-10-CM

## 2013-03-23 DIAGNOSIS — I428 Other cardiomyopathies: Secondary | ICD-10-CM | POA: Diagnosis not present

## 2013-03-23 MED ORDER — FLUOXETINE HCL 10 MG PO TABS: 10.0000 mg | ORAL_TABLET | Freq: Every day | ORAL | Status: DC

## 2013-03-23 MED ORDER — WARFARIN SODIUM 5 MG PO TABS: ORAL_TABLET | ORAL | Status: DC

## 2013-03-23 MED ORDER — TRIAMCINOLONE ACETONIDE 0.5 % EX OINT: TOPICAL_OINTMENT | Freq: Two times a day (BID) | CUTANEOUS | Status: DC

## 2013-03-23 MED ORDER — DONEPEZIL HCL 5 MG PO TABS: ORAL_TABLET | ORAL | Status: DC

## 2013-03-23 MED ORDER — AMIODARONE HCL 200 MG PO TABS: ORAL_TABLET | ORAL | Status: DC

## 2013-03-23 MED ORDER — OMEPRAZOLE 40 MG PO CPDR: DELAYED_RELEASE_CAPSULE | ORAL | Status: DC

## 2013-03-23 NOTE — Assessment & Plan Note (Signed)
Resolved

## 2013-03-23 NOTE — Assessment & Plan Note (Signed)
We can try a low dose Fluoxetine

## 2013-03-23 NOTE — Assessment & Plan Note (Signed)
Continue with current prescription therapy as reflected on the Med list.  

## 2013-03-23 NOTE — Progress Notes (Signed)
   Subjective:     HPI F/u L eye pain x days F/u neck/B shoulders pain and a growth - better  The patient presents for a follow-up of  chronic hypertension, A fib and chronic dyslipidemia, controlled with medicines.  F/u stiffness in LS spine and arthralgias - much better off Lipitor F/u  memory issues; got lost a few times xmonths - no change   BP Readings from Last 3 Encounters:  03/23/13 132/90  03/22/13 142/86  02/19/13 170/90   Wt Readings from Last 3 Encounters:  03/23/13 177 lb 6.4 oz (80.468 kg)  03/22/13 177 lb 12.8 oz (80.65 kg)  02/19/13 175 lb (79.379 kg)      Review of Systems  Constitutional: Negative for activity change, appetite change, fatigue and unexpected weight change.  HENT: Negative for congestion, mouth sores and sinus pressure.   Eyes: Negative for visual disturbance.  Respiratory: Negative for cough and chest tightness.   Genitourinary: Negative for difficulty urinating and vaginal pain.  Musculoskeletal: Negative for gait problem.  Skin: Negative for pallor.  Neurological: Negative for dizziness, tremors, weakness and numbness.  Psychiatric/Behavioral: Positive for behavioral problems and decreased concentration. Negative for suicidal ideas, confusion and sleep disturbance. The patient is nervous/anxious.        Objective:   Physical Exam  Constitutional: She is oriented to person, place, and time. She appears well-developed and well-nourished. No distress.  HENT:  Head: Normocephalic.  Right Ear: External ear normal.  Left Ear: External ear normal.  Nose: Nose normal.  Mouth/Throat: Oropharynx is clear and moist.  Eyes: Conjunctivae are normal. Pupils are equal, round, and reactive to light. Right eye exhibits no discharge. Left eye exhibits no discharge.  Neck: Normal range of motion. Neck supple. No JVD present. No tracheal deviation present. No thyromegaly present.  Cardiovascular: Normal rate and normal heart sounds.    Pulmonary/Chest: No stridor. No respiratory distress. She has no wheezes.  Abdominal: Soft. Bowel sounds are normal. She exhibits no distension and no mass. There is no tenderness. There is no rebound and no guarding.  R groin w/less fullness, not  tender  Musculoskeletal: She exhibits edema (trace B) and tenderness (LS spine L>>R).  Lymphadenopathy:    She has no cervical adenopathy.  Neurological: She is alert and oriented to person, place, and time. She displays normal reflexes. No cranial nerve deficit. She exhibits normal muscle tone. Coordination (mild) abnormal.  Skin: No rash noted. No erythema.  A lot of veins on LEs  Psychiatric: She has a normal mood and affect. Her behavior is normal. Judgment and thought content normal.  8/14 L eye lower eyelid  3x2 cm oval lump at the L post neck base  Lab Results  Component Value Date   WBC 8.8 03/22/2013   HGB 13.8 03/22/2013   HCT 41.4 03/22/2013   PLT 236.0 03/22/2013   GLUCOSE 96 03/22/2013   CHOL 160 03/22/2013   TRIG 89.0 03/22/2013   HDL 63.10 03/22/2013   LDLCALC 79 03/22/2013   ALT 22 03/22/2013   AST 22 03/22/2013   NA 137 03/22/2013   K 4.1 03/22/2013   CL 101 03/22/2013   CREATININE 0.8 03/22/2013   BUN 13 03/22/2013   CO2 32 03/22/2013   TSH 0.92 03/22/2013   INR 1.9 03/22/2013           Assessment & Plan:

## 2013-03-26 DIAGNOSIS — D251 Intramural leiomyoma of uterus: Secondary | ICD-10-CM | POA: Diagnosis not present

## 2013-03-26 DIAGNOSIS — N950 Postmenopausal bleeding: Secondary | ICD-10-CM | POA: Diagnosis not present

## 2013-04-02 ENCOUNTER — Other Ambulatory Visit (HOSPITAL_COMMUNITY): Payer: Self-pay | Admitting: Internal Medicine

## 2013-04-02 ENCOUNTER — Ambulatory Visit (HOSPITAL_COMMUNITY): Payer: Medicare Other | Attending: Internal Medicine

## 2013-04-02 DIAGNOSIS — I4891 Unspecified atrial fibrillation: Secondary | ICD-10-CM | POA: Diagnosis not present

## 2013-04-02 DIAGNOSIS — I079 Rheumatic tricuspid valve disease, unspecified: Secondary | ICD-10-CM | POA: Insufficient documentation

## 2013-04-02 DIAGNOSIS — I509 Heart failure, unspecified: Secondary | ICD-10-CM | POA: Diagnosis not present

## 2013-04-02 DIAGNOSIS — R0609 Other forms of dyspnea: Secondary | ICD-10-CM | POA: Insufficient documentation

## 2013-04-02 DIAGNOSIS — I080 Rheumatic disorders of both mitral and aortic valves: Secondary | ICD-10-CM | POA: Insufficient documentation

## 2013-04-02 DIAGNOSIS — E785 Hyperlipidemia, unspecified: Secondary | ICD-10-CM | POA: Insufficient documentation

## 2013-04-02 DIAGNOSIS — I428 Other cardiomyopathies: Secondary | ICD-10-CM | POA: Insufficient documentation

## 2013-04-02 DIAGNOSIS — I10 Essential (primary) hypertension: Secondary | ICD-10-CM | POA: Diagnosis not present

## 2013-04-02 DIAGNOSIS — R0989 Other specified symptoms and signs involving the circulatory and respiratory systems: Secondary | ICD-10-CM | POA: Diagnosis not present

## 2013-04-02 DIAGNOSIS — R0789 Other chest pain: Secondary | ICD-10-CM | POA: Insufficient documentation

## 2013-04-02 DIAGNOSIS — R0602 Shortness of breath: Secondary | ICD-10-CM

## 2013-04-02 NOTE — Progress Notes (Signed)
Echocardiogram performed.  

## 2013-04-06 DIAGNOSIS — Z1231 Encounter for screening mammogram for malignant neoplasm of breast: Secondary | ICD-10-CM | POA: Diagnosis not present

## 2013-04-06 LAB — HM MAMMOGRAPHY

## 2013-04-09 ENCOUNTER — Encounter: Payer: Self-pay | Admitting: Internal Medicine

## 2013-04-16 DIAGNOSIS — N390 Urinary tract infection, site not specified: Secondary | ICD-10-CM | POA: Diagnosis not present

## 2013-04-16 DIAGNOSIS — N9489 Other specified conditions associated with female genital organs and menstrual cycle: Secondary | ICD-10-CM | POA: Diagnosis not present

## 2013-04-16 DIAGNOSIS — N8189 Other female genital prolapse: Secondary | ICD-10-CM | POA: Diagnosis not present

## 2013-04-16 DIAGNOSIS — R300 Dysuria: Secondary | ICD-10-CM | POA: Diagnosis not present

## 2013-04-19 ENCOUNTER — Ambulatory Visit (INDEPENDENT_AMBULATORY_CARE_PROVIDER_SITE_OTHER): Payer: Medicare Other | Admitting: General Practice

## 2013-04-19 DIAGNOSIS — Z7901 Long term (current) use of anticoagulants: Secondary | ICD-10-CM

## 2013-04-19 DIAGNOSIS — I4891 Unspecified atrial fibrillation: Secondary | ICD-10-CM | POA: Diagnosis not present

## 2013-04-19 LAB — POCT INR: INR: 3.2

## 2013-04-21 DIAGNOSIS — N390 Urinary tract infection, site not specified: Secondary | ICD-10-CM | POA: Diagnosis not present

## 2013-04-21 DIAGNOSIS — N8110 Cystocele, unspecified: Secondary | ICD-10-CM | POA: Diagnosis not present

## 2013-04-21 DIAGNOSIS — N3946 Mixed incontinence: Secondary | ICD-10-CM | POA: Diagnosis not present

## 2013-04-21 DIAGNOSIS — N3020 Other chronic cystitis without hematuria: Secondary | ICD-10-CM | POA: Diagnosis not present

## 2013-05-06 ENCOUNTER — Inpatient Hospital Stay (HOSPITAL_COMMUNITY)
Admission: AD | Admit: 2013-05-06 | Discharge: 2013-05-10 | DRG: 872 | Disposition: A | Discharge status: Skilled Nursing Facility | Payer: Medicare Other | Attending: Internal Medicine | Admitting: Internal Medicine

## 2013-05-06 ENCOUNTER — Encounter (HOSPITAL_COMMUNITY): Payer: Self-pay | Admitting: Emergency Medicine

## 2013-05-06 DIAGNOSIS — Z7901 Long term (current) use of anticoagulants: Secondary | ICD-10-CM | POA: Diagnosis not present

## 2013-05-06 DIAGNOSIS — E861 Hypovolemia: Secondary | ICD-10-CM | POA: Diagnosis present

## 2013-05-06 DIAGNOSIS — R5381 Other malaise: Secondary | ICD-10-CM | POA: Diagnosis not present

## 2013-05-06 DIAGNOSIS — F329 Major depressive disorder, single episode, unspecified: Secondary | ICD-10-CM

## 2013-05-06 DIAGNOSIS — Z833 Family history of diabetes mellitus: Secondary | ICD-10-CM | POA: Diagnosis not present

## 2013-05-06 DIAGNOSIS — Z5189 Encounter for other specified aftercare: Secondary | ICD-10-CM | POA: Diagnosis not present

## 2013-05-06 DIAGNOSIS — I4891 Unspecified atrial fibrillation: Secondary | ICD-10-CM | POA: Diagnosis not present

## 2013-05-06 DIAGNOSIS — N10 Acute tubulo-interstitial nephritis: Secondary | ICD-10-CM | POA: Diagnosis not present

## 2013-05-06 DIAGNOSIS — N390 Urinary tract infection, site not specified: Secondary | ICD-10-CM | POA: Diagnosis present

## 2013-05-06 DIAGNOSIS — N12 Tubulo-interstitial nephritis, not specified as acute or chronic: Secondary | ICD-10-CM | POA: Diagnosis present

## 2013-05-06 DIAGNOSIS — K219 Gastro-esophageal reflux disease without esophagitis: Secondary | ICD-10-CM | POA: Diagnosis not present

## 2013-05-06 DIAGNOSIS — Z1639 Resistance to other specified antimicrobial drug: Secondary | ICD-10-CM | POA: Diagnosis present

## 2013-05-06 DIAGNOSIS — I059 Rheumatic mitral valve disease, unspecified: Secondary | ICD-10-CM | POA: Diagnosis present

## 2013-05-06 DIAGNOSIS — Z8249 Family history of ischemic heart disease and other diseases of the circulatory system: Secondary | ICD-10-CM

## 2013-05-06 DIAGNOSIS — A419 Sepsis, unspecified organism: Principal | ICD-10-CM | POA: Diagnosis present

## 2013-05-06 DIAGNOSIS — F3289 Other specified depressive episodes: Secondary | ICD-10-CM | POA: Diagnosis present

## 2013-05-06 DIAGNOSIS — I428 Other cardiomyopathies: Secondary | ICD-10-CM | POA: Diagnosis not present

## 2013-05-06 DIAGNOSIS — R404 Transient alteration of awareness: Secondary | ICD-10-CM | POA: Diagnosis not present

## 2013-05-06 DIAGNOSIS — R110 Nausea: Secondary | ICD-10-CM | POA: Diagnosis not present

## 2013-05-06 DIAGNOSIS — Z79899 Other long term (current) drug therapy: Secondary | ICD-10-CM

## 2013-05-06 DIAGNOSIS — A498 Other bacterial infections of unspecified site: Secondary | ICD-10-CM | POA: Diagnosis not present

## 2013-05-06 DIAGNOSIS — A4159 Other Gram-negative sepsis: Secondary | ICD-10-CM | POA: Diagnosis not present

## 2013-05-06 DIAGNOSIS — E785 Hyperlipidemia, unspecified: Secondary | ICD-10-CM | POA: Diagnosis present

## 2013-05-06 DIAGNOSIS — I5022 Chronic systolic (congestive) heart failure: Secondary | ICD-10-CM | POA: Diagnosis present

## 2013-05-06 DIAGNOSIS — R5383 Other fatigue: Secondary | ICD-10-CM | POA: Diagnosis not present

## 2013-05-06 DIAGNOSIS — I509 Heart failure, unspecified: Secondary | ICD-10-CM | POA: Diagnosis not present

## 2013-05-06 DIAGNOSIS — M6281 Muscle weakness (generalized): Secondary | ICD-10-CM | POA: Diagnosis not present

## 2013-05-06 LAB — CBC WITH DIFFERENTIAL/PLATELET
Basophils Absolute: 0 10*3/uL (ref 0.0–0.1)
Basophils Relative: 0 % (ref 0–1)
Eosinophils Absolute: 0 10*3/uL (ref 0.0–0.7)
Eosinophils Relative: 0 % (ref 0–5)
HCT: 38.2 % (ref 36.0–46.0)
Hemoglobin: 13 g/dL (ref 12.0–15.0)
Lymphocytes Relative: 3 % — ABNORMAL LOW (ref 12–46)
Lymphs Abs: 0.7 10*3/uL (ref 0.7–4.0)
MCH: 29.6 pg (ref 26.0–34.0)
MCHC: 34 g/dL (ref 30.0–36.0)
MCV: 87 fL (ref 78.0–100.0)
Monocytes Absolute: 2.2 10*3/uL — ABNORMAL HIGH (ref 0.1–1.0)
Monocytes Relative: 9 % (ref 3–12)
Neutro Abs: 21 10*3/uL — ABNORMAL HIGH (ref 1.7–7.7)
Neutrophils Relative %: 88 % — ABNORMAL HIGH (ref 43–77)
Platelets: 159 10*3/uL (ref 150–400)
RBC: 4.39 MIL/uL (ref 3.87–5.11)
RDW: 13.8 % (ref 11.5–15.5)
WBC: 23.9 10*3/uL — ABNORMAL HIGH (ref 4.0–10.5)

## 2013-05-06 LAB — POCT I-STAT TROPONIN I: Troponin i, poc: 0.04 ng/mL (ref 0.00–0.08)

## 2013-05-06 LAB — COMPREHENSIVE METABOLIC PANEL
ALT: 17 U/L (ref 0–35)
AST: 26 U/L (ref 0–37)
Albumin: 2.7 g/dL — ABNORMAL LOW (ref 3.5–5.2)
Alkaline Phosphatase: 97 U/L (ref 39–117)
BUN: 17 mg/dL (ref 6–23)
CO2: 27 mEq/L (ref 19–32)
Calcium: 9 mg/dL (ref 8.4–10.5)
Chloride: 94 mEq/L — ABNORMAL LOW (ref 96–112)
Creatinine, Ser: 1.4 mg/dL — ABNORMAL HIGH (ref 0.50–1.10)
GFR calc Af Amer: 42 mL/min — ABNORMAL LOW (ref >90)
GFR calc non Af Amer: 36 mL/min — ABNORMAL LOW (ref >90)
Glucose, Bld: 206 mg/dL — ABNORMAL HIGH (ref 70–99)
Potassium: 3.7 mEq/L (ref 3.5–5.1)
Sodium: 131 mEq/L — ABNORMAL LOW (ref 135–145)
Total Bilirubin: 0.9 mg/dL (ref 0.3–1.2)
Total Protein: 5.9 g/dL — ABNORMAL LOW (ref 6.0–8.3)

## 2013-05-06 LAB — PROTIME-INR
INR: 1.7 — ABNORMAL HIGH (ref 0.00–1.49)
Prothrombin Time: 19.5 seconds — ABNORMAL HIGH (ref 11.6–15.2)

## 2013-05-06 LAB — URINE MICROSCOPIC-ADD ON

## 2013-05-06 LAB — URINALYSIS, ROUTINE W REFLEX MICROSCOPIC
Bilirubin Urine: NEGATIVE
Glucose, UA: NEGATIVE mg/dL
Ketones, ur: NEGATIVE mg/dL
Nitrite: NEGATIVE
Protein, ur: 100 mg/dL — AB
Specific Gravity, Urine: 1.02 (ref 1.005–1.030)
Urobilinogen, UA: 4 mg/dL — ABNORMAL HIGH (ref 0.0–1.0)
pH: 6.5 (ref 5.0–8.0)

## 2013-05-06 LAB — LACTIC ACID, PLASMA: Lactic Acid, Venous: 2.1 mmol/L (ref 0.5–2.2)

## 2013-05-06 MED ORDER — FLUOXETINE HCL 20 MG PO TABS: 10.0000 mg | ORAL_TABLET | Freq: Every day | ORAL | Status: DC

## 2013-05-06 MED ORDER — PANTOPRAZOLE SODIUM 40 MG PO TBEC
40.0000 mg | DELAYED_RELEASE_TABLET | Freq: Every day | ORAL | Status: DC
  Administered 2013-05-07 – 2013-05-10 (×4): 40 mg via ORAL
  Filled 2013-05-06 (×4): qty 1

## 2013-05-06 MED ORDER — SODIUM CHLORIDE 0.9 % IV SOLN
INTRAVENOUS | Status: DC
  Administered 2013-05-06: 75 mL via INTRAVENOUS
  Administered 2013-05-07 (×2): via INTRAVENOUS
  Administered 2013-05-09: 0.9 mL via INTRAVENOUS
  Administered 2013-05-09 – 2013-05-10 (×2): via INTRAVENOUS

## 2013-05-06 MED ORDER — ACETAMINOPHEN 325 MG PO TABS
650.0000 mg | ORAL_TABLET | Freq: Four times a day (QID) | ORAL | Status: DC | PRN
  Administered 2013-05-07 – 2013-05-08 (×2): 650 mg via ORAL
  Filled 2013-05-06 (×2): qty 2

## 2013-05-06 MED ORDER — FLUOXETINE HCL 10 MG PO CAPS
10.0000 mg | ORAL_CAPSULE | Freq: Every day | ORAL | Status: DC
  Administered 2013-05-07 – 2013-05-10 (×4): 10 mg via ORAL
  Filled 2013-05-06 (×5): qty 1

## 2013-05-06 MED ORDER — MORPHINE SULFATE 2 MG/ML IJ SOLN
2.0000 mg | INTRAMUSCULAR | Status: DC | PRN
  Administered 2013-05-06: 2 mg via INTRAVENOUS
  Filled 2013-05-06: qty 1

## 2013-05-06 MED ORDER — WARFARIN - PHARMACIST DOSING INPATIENT: Freq: Every day | Status: DC

## 2013-05-06 MED ORDER — AMIODARONE HCL 200 MG PO TABS
200.0000 mg | ORAL_TABLET | Freq: Every day | ORAL | Status: DC
  Administered 2013-05-07 – 2013-05-10 (×4): 200 mg via ORAL
  Filled 2013-05-06 (×5): qty 1

## 2013-05-06 MED ORDER — SODIUM CHLORIDE 0.9 % IV BOLUS (SEPSIS)
500.0000 mL | Freq: Once | INTRAVENOUS | Status: AC
  Administered 2013-05-06: 500 mL via INTRAVENOUS

## 2013-05-06 MED ORDER — DEXTROSE 5 % IV SOLN
1.0000 g | Freq: Once | INTRAVENOUS | Status: AC
  Administered 2013-05-06: 1 g via INTRAVENOUS
  Filled 2013-05-06: qty 10

## 2013-05-06 MED ORDER — DEXTROSE 5 % IV SOLN
1.0000 g | INTRAVENOUS | Status: DC
  Administered 2013-05-07 – 2013-05-08 (×2): 1 g via INTRAVENOUS
  Filled 2013-05-06 (×3): qty 10

## 2013-05-06 MED ORDER — ONDANSETRON HCL 4 MG PO TABS: 4.0000 mg | ORAL_TABLET | Freq: Four times a day (QID) | ORAL | Status: DC | PRN

## 2013-05-06 MED ORDER — ACETAMINOPHEN 325 MG PO TABS
650.0000 mg | ORAL_TABLET | Freq: Once | ORAL | Status: AC
  Administered 2013-05-06: 650 mg via ORAL
  Filled 2013-05-06: qty 2

## 2013-05-06 MED ORDER — POLYETHYLENE GLYCOL 3350 17 G PO PACK
17.0000 g | PACK | Freq: Every day | ORAL | Status: DC | PRN
  Filled 2013-05-06: qty 1

## 2013-05-06 MED ORDER — WARFARIN SODIUM 5 MG PO TABS
5.0000 mg | ORAL_TABLET | Freq: Once | ORAL | Status: DC
  Filled 2013-05-06: qty 1

## 2013-05-06 MED ORDER — ACETAMINOPHEN 650 MG RE SUPP: 650.0000 mg | Freq: Four times a day (QID) | RECTAL | Status: DC | PRN

## 2013-05-06 MED ORDER — ONDANSETRON HCL 4 MG/2ML IJ SOLN
4.0000 mg | Freq: Four times a day (QID) | INTRAMUSCULAR | Status: DC | PRN
  Administered 2013-05-06: 4 mg via INTRAVENOUS
  Filled 2013-05-06: qty 2

## 2013-05-06 NOTE — ED Notes (Signed)
Bed: JX91 Expected date:  Expected time:  Means of arrival:  Comments: EMS-bladder infection

## 2013-05-06 NOTE — ED Notes (Signed)
Per EMS-pt c/o of bladder infection. Spacer removed couple of months ago. Bactrium 3 rounds, no relief.

## 2013-05-06 NOTE — Progress Notes (Signed)
Utilization Review completed.  Jiana Lemaire RN CM  

## 2013-05-06 NOTE — H&P (Signed)
[facility] History and Physical  RAMONDA GALYON ZOX:[id number] DOB: [birth year] DOA: 05/06/2013  Referring physician: Emergency Department PCP: Sonda Primes, MD  Specialists:   Chief Complaint: UTI  HPI: Virginia Young is a 72 y.o. female  With a hx of multiple prior UTI's treated as an outpatient, afib on coumadin, and hx of cardiomyopathy who presents to the ED with worsening suprapubic pain. Pt was recently treated as an outpatient with bactrim for UTI. In the ED, the pt was noted to have a WBC of over 23K, temp of over 101F, and a UA suggestive of UTI. The patient was given a dose of rocephin and the hospitalists were consulted for admission  Review of Systems:  Per above, the remainder of the 10pt ros reviewed and are neg  Past Medical History  Diagnosis Date  . NICM (nonischemic cardiomyopathy)     a. neg CLite in 2003;  b. EF 40-45% in past;   c.  Echo 12/11: EF 35-40%, mild MR, mild LAE, mild RAE, small pericardial effusion   . Mild mitral regurgitation   . HLD (hyperlipidemia)   . Chronic systolic heart failure   . Atrial fibrillation     a. coumadin;  b. Amiodarone  . Stasis ulcer   . Absence of menstruation   . Family history of ischemic heart disease   . Varicose veins   . Depression   . Osteoarthritis   . Chest pain 03/2012    a. Lex MV 10/13:  EF 64%, dist ant and apical defect sugg of soft tissue atten, no ischemia  . Cataract   . GERD (gastroesophageal reflux disease)    Past Surgical History  Procedure Laterality Date  . Lipoma excision      h/o removed from upper back x 2  . Inguinal hernia repair      right   Social History:  reports that she has never smoked. She has never used smokeless tobacco. She reports that she does not drink alcohol or use illicit drugs.  where does patient live--home, ALF, SNF? and with whom if at home?  Can patient participate in ADLs?  Allergies  Allergen Reactions  . Lipitor [Atorvastatin Calcium]    myalgia    Family History  Problem Relation Age of Onset  . Diabetes Father 60    diabetes and syncope  . CAD    . Heart attack Father   . Heart attack Sister   . Heart attack Brother     all 5 brothers had MIs  . Colon cancer Neg Hx     (be sure to complete)  Prior to Admission medications   Medication Sig Start Date End Date Taking? Authorizing Provider  amiodarone (PACERONE) 200 MG tablet take 1 tablet by mouth once daily 03/23/13  Yes Tresa Garter, MD  Cholecalciferol (VITAMIN D3) 2000 UNITS TABS Take 1 tablet by mouth daily. 11/08/11  Yes Georgina Quint Plotnikov, MD  donepezil (ARICEPT) 5 MG tablet take 1 tablet by mouth at bedtime 03/23/13  Yes Georgina Quint Plotnikov, MD  FLUoxetine (PROZAC) 10 MG tablet Take 1 tablet (10 mg total) by mouth daily. 03/23/13  Yes Georgina Quint Plotnikov, MD  omeprazole (PRILOSEC) 40 MG capsule take 1 capsule by mouth once daily 03/23/13  Yes Georgina Quint Plotnikov, MD  warfarin (COUMADIN) 5 MG tablet Take 2.5-5 mg by mouth daily. Takes 5mg  Tuesday, Thursday, and Saturday. Takes 2.5mg  all other days. 03/23/13  Yes Tresa Garter, MD   Physical Exam: Ceasar Mons  Vitals:   05/06/13 1402 05/06/13 1515 05/06/13 1527 05/06/13 1606  BP: 115/46  115/56 115/57  Pulse: 88  79 81  Temp: 99.3 F (37.4 C) 101.6 F (38.7 C)    TempSrc: Oral Rectal    Resp: 18  17 18   SpO2: 100%  95% 99%     General:  Awake, in nad  Eyes: PERRL B   ENT: Membranes moist, dentition fair  Neck: trachea midline, neck supple  Cardiovascular: regular, s1, s2  Respiratory: normal resp effort, no wheezing  Abdomen: soft, nondistended  Skin: normal skin turgor, multiple patches of ecchymosis  Musculoskeletal: perfused, no clubbing  Psychiatric: mood/affect normal // no auditory/visual hallucinations  Neurologic: cn2-12 grossly intact, strength/sensation intact  Labs on Admission:  Basic Metabolic Panel:  Recent Labs Lab 05/06/13 1424  NA 131*  K 3.7  CL 94*  CO2 27   GLUCOSE 206*  BUN 17  CREATININE 1.40*  CALCIUM 9.0   Liver Function Tests:  Recent Labs Lab 05/06/13 1424  AST 26  ALT 17  ALKPHOS 97  BILITOT 0.9  PROT 5.9*  ALBUMIN 2.7*   No results found for this basename: LIPASE, AMYLASE,  in the last 168 hours No results found for this basename: AMMONIA,  in the last 168 hours CBC:  Recent Labs Lab 05/06/13 1424  WBC 23.9*  NEUTROABS 21.0*  HGB 13.0  HCT 38.2  MCV 87.0  PLT 159   Cardiac Enzymes: No results found for this basename: CKTOTAL, CKMB, CKMBINDEX, TROPONINI,  in the last 168 hours  BNP (last 3 results) No results found for this basename: PROBNP,  in the last 8760 hours CBG: No results found for this basename: GLUCAP,  in the last 168 hours  Radiological Exams on Admission: No results found.  Assessment/Plan Principal Problem:   Sepsis Active Problems:   CARDIOMYOPATHY   Atrial fibrillation   CONGESTIVE HEART FAILURE   UTI (lower urinary tract infection)   1. Sepsis with UTI 1. Urine cx pending 2. Cont rocephin for now, pending speciation and sensitivities 3. Admit to med floor, inpt 4. Lactate pending 5. Volume resuscitate as tolerated, mindful of hx of cardiomyopathy 2. Afib 1. On coumadin 2. No signs of bleed 3. Currently rate controlled 3. Cardiomyopathy 1. Euvolemic to slightly hypovolemic 2. Gentle IVF as tolerated 4. DVT prophylaxis 1. On coumadin  Code Status: Full (must indicate code status--if unknown or must be presumed, indicate so) Family Communication: Pt in room (indicate person spoken with, if applicable, with phone number if by telephone) Disposition Plan: Pending (indicate anticipated LOS)  Time spent:  Riyaan Heroux K [facility] Pager [phone]  If 7PM-7AM, please contact night-coverage [email or web address] Password [email or web address] 05/06/2013, 4:10 PM

## 2013-05-06 NOTE — Progress Notes (Signed)
ANTICOAGULATION CONSULT NOTE - Initial Consult  Pharmacy Consult for warfarin Indication: atrial fibrillation  Allergies  Allergen Reactions  . Lipitor [Atorvastatin Calcium]     myalgia    Patient Measurements:     Vital Signs: Temp: 101.6 F (38.7 C) (11/13 1515) Temp src: Rectal (11/13 1515) BP: 115/57 mmHg (11/13 1606) Pulse Rate: 81 (11/13 1606)  Labs:  Recent Labs  05/06/13 1424  HGB 13.0  HCT 38.2  PLT 159  LABPROT 19.5*  INR 1.70*  CREATININE 1.40*    The CrCl is unknown because both a height and weight (above a minimum accepted value) are required for this calculation.   Medical History: Past Medical History  Diagnosis Date  . NICM (nonischemic cardiomyopathy)     a. neg CLite in 2003;  b. EF 40-45% in past;   c.  Echo 12/11: EF 35-40%, mild MR, mild LAE, mild RAE, small pericardial effusion   . Mild mitral regurgitation   . HLD (hyperlipidemia)   . Chronic systolic heart failure   . Atrial fibrillation     a. coumadin;  b. Amiodarone  . Stasis ulcer   . Absence of menstruation   . Family history of ischemic heart disease   . Varicose veins   . Depression   . Osteoarthritis   . Chest pain 03/2012    a. Lex MV 10/13:  EF 64%, dist ant and apical defect sugg of soft tissue atten, no ischemia  . Cataract   . GERD (gastroesophageal reflux disease)     Medications:  Scheduled:  . amiodarone  200 mg Oral Daily  . FLUoxetine  10 mg Oral Daily  . pantoprazole  40 mg Oral Daily   Infusions:  . sodium chloride    . [START ON 05/07/2013] cefTRIAXone (ROCEPHIN)  IV      Assessment: 72 yo female presented to ER with UTI to treat with Rocephin. Pharmacy consult to continue patient's warfarin as they take 2.5mg  daily except 5mg  on TuesThursSat for hx Afib followed by [place] anticoagulation clinic. Last dose in the past week per med history list.  Current INR subtherapeutic  Stable CBC  No reported bleeding  Goal of Therapy:  INR 2-3    Plan:  1) Will proceed with 5mg  warfarin tonight 2) Daily INR   Hessie Knows, PharmD, BCPS Pager [phone] 05/06/2013 4:53 PM

## 2013-05-06 NOTE — ED Provider Notes (Signed)
CSN: [id number]     Arrival date & time 05/06/13  1401 History   First MD Initiated Contact with Patient 05/06/13 1425     Chief Complaint  Patient presents with  . Cystitis   (Consider location/radiation/quality/duration/timing/severity/associated sxs/prior Treatment) The history is provided by the patient and a relative.   Virginia Young is a 72 y.o. w/ pmhx of afib on coumadin presenting with a cc of suprapubic pain and dysuria. The patient has had numerous UTIs. 2 months ago she had a pessary removed by her urologist (Dr. Patsi Sears) because it had become infected. The patient has dysuria and was diagnosed with UTI and received 7 days of Bactrim. This partially alleviated her symptoms. After completing the abx, the symptoms returned and worsened. She now reports pain radiating to her flanks. She has subjective fevers, chills,a nd weakness. She was found on the ground this morning by her daughter. There was no LOC, just global weakness and lethargy. The patients daughter called EMS and the patient was brought to ED.  Denies CP, SOB, headache, focal weakness, numbness.  Past Medical History  Diagnosis Date  . NICM (nonischemic cardiomyopathy)     a. neg CLite in 2003;  b. EF 40-45% in past;   c.  Echo 12/11: EF 35-40%, mild MR, mild LAE, mild RAE, small pericardial effusion   . Mild mitral regurgitation   . HLD (hyperlipidemia)   . Chronic systolic heart failure   . Atrial fibrillation     a. coumadin;  b. Amiodarone  . Stasis ulcer   . Absence of menstruation   . Family history of ischemic heart disease   . Varicose veins   . Depression   . Osteoarthritis   . Chest pain 03/2012    a. Lex MV 10/13:  EF 64%, dist ant and apical defect sugg of soft tissue atten, no ischemia  . Cataract   . GERD (gastroesophageal reflux disease)    Past Surgical History  Procedure Laterality Date  . Lipoma excision      h/o removed from upper back x 2  . Inguinal hernia repair      right    Family History  Problem Relation Age of Onset  . Diabetes Father 43    diabetes and syncope  . CAD    . Heart attack Father   . Heart attack Sister   . Heart attack Brother     all 5 brothers had MIs  . Colon cancer Neg Hx    History  Substance Use Topics  . Smoking status: Never Smoker   . Smokeless tobacco: Never Used  . Alcohol Use: No   OB History   Grav Para Term Preterm Abortions TAB SAB Ect Mult Living                 Review of Systems  Constitutional: Positive for chills and fatigue. Negative for diaphoresis.  HENT: Negative for congestion, hearing loss and trouble swallowing.   Respiratory: Negative for cough, chest tightness, shortness of breath and wheezing.   Cardiovascular: Negative for chest pain, palpitations and leg swelling.  Genitourinary: Positive for dysuria, urgency, frequency, flank pain and difficulty urinating. Negative for hematuria and vaginal bleeding.  Neurological: Positive for dizziness, weakness, light-headedness and headaches.  Psychiatric/Behavioral: Positive for confusion and decreased concentration.    Allergies  Lipitor  Home Medications   Current Outpatient Rx  Name  Route  Sig  Dispense  Refill  . amiodarone (PACERONE) 200 MG tablet  take 1 tablet by mouth once daily   90 tablet   3   . Cholecalciferol (VITAMIN D3) 2000 UNITS TABS   Oral   Take 1 tablet by mouth daily.   100 tablet   3   . donepezil (ARICEPT) 5 MG tablet      take 1 tablet by mouth at bedtime   90 tablet   3   . FLUoxetine (PROZAC) 10 MG tablet   Oral   Take 1 tablet (10 mg total) by mouth daily.   90 tablet   1   . omeprazole (PRILOSEC) 40 MG capsule      take 1 capsule by mouth once daily   90 capsule   3   . warfarin (COUMADIN) 5 MG tablet      take as directed BY COUMADIN CLINIC   90 tablet   3    BP 115/56  Pulse 79  Temp(Src) 101.6 F (38.7 C) (Rectal)  Resp 17  SpO2 95% Physical Exam  Constitutional: She is oriented  to person, place, and time. She appears well-developed and well-nourished. No distress.  HENT:  Head: Normocephalic.  Tacky mucous membranes  Pulmonary/Chest: Effort normal and breath sounds normal. No respiratory distress. She has no wheezes. She has no rales.  Abdominal: Soft. She exhibits no distension and no mass. There is tenderness. There is no rebound and no guarding.  Suprapubic pain. Flank pain  Musculoskeletal: She exhibits no edema and no tenderness.  Neurological: She is alert and oriented to person, place, and time.  midly confused  Skin: Skin is warm. She is not diaphoretic.    ED Course  Procedures (including critical care time) Labs Review Labs Reviewed  PROTIME-INR - Abnormal; Notable for the following:    Prothrombin Time 19.5 (*)    INR 1.70 (*)    All other components within normal limits  CBC WITH DIFFERENTIAL - Abnormal; Notable for the following:    WBC 23.9 (*)    Neutrophils Relative % 88 (*)    Neutro Abs 21.0 (*)    Lymphocytes Relative 3 (*)    Monocytes Absolute 2.2 (*)    All other components within normal limits  COMPREHENSIVE METABOLIC PANEL - Abnormal; Notable for the following:    Sodium 131 (*)    Chloride 94 (*)    Glucose, Bld 206 (*)    Creatinine, Ser 1.40 (*)    Total Protein 5.9 (*)    Albumin 2.7 (*)    GFR calc non Af Amer 36 (*)    GFR calc Af Amer 42 (*)    All other components within normal limits  URINALYSIS, ROUTINE W REFLEX MICROSCOPIC  POCT I-STAT TROPONIN I   Imaging Review No results found.  EKG Interpretation     Ventricular Rate:  83 PR Interval:  162 QRS Duration: 90 QT Interval:  400 QTC Calculation: 470 R Axis:   -28 Text Interpretation:  Sinus rhythm Probable left atrial enlargement Borderline left axis deviation Low voltage, precordial leads Baseline wander in lead(s) V5            MDM   1. Pyelonephritis     1. UTI likely c/b Pyelonephritis  The patients suprapubic tenderness and dysuria  likely 2/2 to UTI complicated by pyelonephritis given UA positive for hg leukocytes and many bacteria, fever of 101.6, and flank pain. Suspect a resistant organism given numerous rounds of abx (bactrim most recently). Plan to consult IM for inpatient management. Patient given 500  cc NS in ED. Patient has EF of 40% by last echo. Patient is normotensive and HR in 80's. Consulted IM for admission. IM team agreed with plan and will admit patient.  Pleas Koch, MD 05/06/13 [phone]

## 2013-05-07 DIAGNOSIS — F329 Major depressive disorder, single episode, unspecified: Secondary | ICD-10-CM

## 2013-05-07 DIAGNOSIS — I509 Heart failure, unspecified: Secondary | ICD-10-CM

## 2013-05-07 DIAGNOSIS — I4891 Unspecified atrial fibrillation: Secondary | ICD-10-CM | POA: Diagnosis not present

## 2013-05-07 DIAGNOSIS — N12 Tubulo-interstitial nephritis, not specified as acute or chronic: Secondary | ICD-10-CM | POA: Diagnosis not present

## 2013-05-07 LAB — CBC
HCT: 35.5 % — ABNORMAL LOW (ref 36.0–46.0)
Hemoglobin: 11.9 g/dL — ABNORMAL LOW (ref 12.0–15.0)
MCH: 29.4 pg (ref 26.0–34.0)
MCHC: 33.5 g/dL (ref 30.0–36.0)
MCV: 87.7 fL (ref 78.0–100.0)
Platelets: 159 10*3/uL (ref 150–400)
RBC: 4.05 MIL/uL (ref 3.87–5.11)
RDW: 14 % (ref 11.5–15.5)
WBC: 24.9 10*3/uL — ABNORMAL HIGH (ref 4.0–10.5)

## 2013-05-07 LAB — COMPREHENSIVE METABOLIC PANEL
ALT: 18 U/L (ref 0–35)
AST: 26 U/L (ref 0–37)
Albumin: 2.2 g/dL — ABNORMAL LOW (ref 3.5–5.2)
Alkaline Phosphatase: 94 U/L (ref 39–117)
BUN: 20 mg/dL (ref 6–23)
CO2: 29 mEq/L (ref 19–32)
Calcium: 8.9 mg/dL (ref 8.4–10.5)
Chloride: 99 mEq/L (ref 96–112)
Creatinine, Ser: 1.49 mg/dL — ABNORMAL HIGH (ref 0.50–1.10)
GFR calc Af Amer: 39 mL/min — ABNORMAL LOW (ref >90)
GFR calc non Af Amer: 34 mL/min — ABNORMAL LOW (ref >90)
Glucose, Bld: 160 mg/dL — ABNORMAL HIGH (ref 70–99)
Potassium: 4.1 mEq/L (ref 3.5–5.1)
Sodium: 134 mEq/L — ABNORMAL LOW (ref 135–145)
Total Bilirubin: 0.5 mg/dL (ref 0.3–1.2)
Total Protein: 5.4 g/dL — ABNORMAL LOW (ref 6.0–8.3)

## 2013-05-07 LAB — PROTIME-INR
INR: 2.11 — ABNORMAL HIGH (ref 0.00–1.49)
Prothrombin Time: 23 seconds — ABNORMAL HIGH (ref 11.6–15.2)

## 2013-05-07 MED ORDER — WARFARIN SODIUM 2.5 MG PO TABS
2.5000 mg | ORAL_TABLET | Freq: Once | ORAL | Status: AC
  Administered 2013-05-07: 2.5 mg via ORAL
  Filled 2013-05-07 (×2): qty 1

## 2013-05-07 NOTE — Progress Notes (Signed)
Inpatient Diabetes Program Recommendations  AACE/ADA: New Consensus Statement on Inpatient Glycemic Control (2013)  Target Ranges:  Prepandial:   less than 140 mg/dL      Peak postprandial:   less than 180 mg/dL (1-2 hours)      Critically ill patients:  140 - 180 mg/dL   Reason for Visit: Results for SHLEY, DOLBY (MRN [id number]) as of 05/07/2013 10:45  Ref. Range 05/06/2013 14:24 05/07/2013 04:55  Glucose Latest Range: 70-99 mg/dL 409 (H) 811 (H)   Note that glucose elevated.  No history of diabetes noted.  May consider CBG's tid with meals and HS.  If greater than 140 mg/dL, may consider adding Novolog sensitive correction.   Thanks, Beryl Meager, RN, BC-ADM Inpatient Diabetes Coordinator Pager [phone]

## 2013-05-07 NOTE — Progress Notes (Signed)
ANTICOAGULATION CONSULT NOTE - Initial Consult  Pharmacy Consult for warfarin Indication: atrial fibrillation  Allergies  Allergen Reactions  . Lipitor [Atorvastatin Calcium]     myalgia    Patient Measurements: Height: 5\' 9"  (175.3 cm) Weight: 170 lb 13.7 oz (77.5 kg) IBW/kg (Calculated) : 66.2   Vital Signs: Temp: 98.6 F (37 C) (11/14 0533) Temp src: Oral (11/14 0533) BP: 94/54 mmHg (11/14 0533) Pulse Rate: 60 (11/14 0533)  Labs:  Recent Labs  05/06/13 1424 05/07/13 0455  HGB 13.0 11.9*  HCT 38.2 35.5*  PLT 159 159  LABPROT 19.5* 23.0*  INR 1.70* 2.11*  CREATININE 1.40* 1.49*    Estimated Creatinine Clearance: 35.1 ml/min (by C-G formula based on Cr of 1.49).   Medical History: Past Medical History  Diagnosis Date  . NICM (nonischemic cardiomyopathy)     a. neg CLite in 2003;  b. EF 40-45% in past;   c.  Echo 12/11: EF 35-40%, mild MR, mild LAE, mild RAE, small pericardial effusion   . Mild mitral regurgitation   . HLD (hyperlipidemia)   . Chronic systolic heart failure   . Atrial fibrillation     a. coumadin;  b. Amiodarone  . Stasis ulcer   . Absence of menstruation   . Family history of ischemic heart disease   . Varicose veins   . Depression   . Osteoarthritis   . Chest pain 03/2012    a. Lex MV 10/13:  EF 64%, dist ant and apical defect sugg of soft tissue atten, no ischemia  . Cataract   . GERD (gastroesophageal reflux disease)     Medications:  Scheduled:  . amiodarone  200 mg Oral Daily  . cefTRIAXone (ROCEPHIN)  IV  1 g Intravenous Q24H  . FLUoxetine  10 mg Oral Daily  . pantoprazole  40 mg Oral Daily  . warfarin  5 mg Oral ONCE-1800  . Warfarin - Pharmacist Dosing Inpatient   Does not apply q1800   Infusions:  . sodium chloride 75 mL/hr at 05/07/13 0636    Assessment: 72 yo female presented to ER with UTI to treat with Rocephin. Pharmacy consult to continue patient's warfarin as they take 2.5mg  daily except 5mg  on TuesThursSat  for hx Afib followed by [name] anticoagulation clinic. Last dose in the past week per med history list. Warfarin was not given 11/13 PM, pt reported taking a dose that morning before admission.   INR therapeutic (2.11)  CBC = 11.9 (down from admission, 11.9g/dl), plts WNL  No reported bleeding  Drug interactions: amiodarone (home med so would not expect acute issues)  Goal of Therapy:  INR 2-3   Plan:  1) 2.5mg  warfarin today as per home dose based on INR rate of rise with 5mg  dose taken prior to admission 11/13 2) Daily INR  Virginia Young, PharmD  05/07/2013 7:55 AM  Virginia Young, PharmD, BCPS.   Pager: [phone] 05/07/2013 9:56 AM

## 2013-05-07 NOTE — Progress Notes (Signed)
[facility] PROGRESS NOTE  Virginia Young ZOX:[id number] DOB: 06-08-1941 DOA: 05/06/2013 PCP: Sonda Primes, MD  Assessment/Plan: 1. Sepsis with UTI  1. Urine cx with no growth to date, but pt had been on abx prior to admit 2. Cont rocephin for now 3. Blood cx pending 4. WBC slightly elevated today 5. Volume resuscitate as tolerated, mindful of hx of cardiomyopathy 2. Afib  1. On coumadin 2. No signs of bleed 3. Currently rate controlled 3. Cardiomyopathy  1. Euvolemic to slightly hypovolemic 2. Gentle IVF as tolerated 4. DVT prophylaxis  1. On coumadin  Code Status: Full Family Communication:  Pt in room (indicate person spoken with, relationship, and if by phone, the number) Disposition Plan: Pending  Antibiotics:  Rocephin 05/06/13>>>  HPI/Subjective: No acute events overnight  Objective: Filed Vitals:   05/07/13 0533 05/07/13 0700 05/07/13 0946 05/07/13 1140  BP: 94/54  122/64 136/68  Pulse: 60  68 70  Temp: 98.6 F (37 C)  97.9 F (36.6 C)   TempSrc: Oral  Oral   Resp: 18  22 18   Height:  5\' 9"  (1.753 m)    Weight:  77.5 kg (170 lb 13.7 oz)    SpO2: 91%  94%     Intake/Output Summary (Last 24 hours) at 05/07/13 1310 Last data filed at 05/07/13 0636  Gross per 24 hour  Intake  872.5 ml  Output    465 ml  Net  407.5 ml   Filed Weights   05/07/13 0700  Weight: 77.5 kg (170 lb 13.7 oz)    Exam:   General:  Awake,in nad  Cardiovascular: regular, s1, s2  Respiratory: normal resp effort, no wheezing  Abdomen: Soft, nondistended  Musculoskeletal: perfused, no clubbing   Data Reviewed: Basic Metabolic Panel:  Recent Labs Lab 05/06/13 1424 05/07/13 0455  NA 131* 134*  K 3.7 4.1  CL 94* 99  CO2 27 29  GLUCOSE 206* 160*  BUN 17 20  CREATININE 1.40* 1.49*  CALCIUM 9.0 8.9   Liver Function Tests:  Recent Labs Lab 05/06/13 1424 05/07/13 0455  AST 26 26  ALT 17 18  ALKPHOS 97 94  BILITOT 0.9 0.5  PROT 5.9* 5.4*   ALBUMIN 2.7* 2.2*   No results found for this basename: LIPASE, AMYLASE,  in the last 168 hours No results found for this basename: AMMONIA,  in the last 168 hours CBC:  Recent Labs Lab 05/06/13 1424 05/07/13 0455  WBC 23.9* 24.9*  NEUTROABS 21.0*  --   HGB 13.0 11.9*  HCT 38.2 35.5*  MCV 87.0 87.7  PLT 159 159   Cardiac Enzymes: No results found for this basename: CKTOTAL, CKMB, CKMBINDEX, TROPONINI,  in the last 168 hours BNP (last 3 results) No results found for this basename: PROBNP,  in the last 8760 hours CBG: No results found for this basename: GLUCAP,  in the last 168 hours  Recent Results (from the past 240 hour(s))  CULTURE, BLOOD (ROUTINE X 2)     Status: None   Collection Time    05/06/13  4:37 PM      Result Value Range Status   Specimen Description BLOOD RIGHT ANTECUBITAL   Final   Special Requests NONE BOTTLES DRAWN AEROBIC AND ANAEROBIC [facility]   Final   Culture  Setup Time     Final   Value: 05/06/2013 23:26     Performed at [facility]   Culture     Final   Value:  BLOOD CULTURE RECEIVED NO GROWTH TO DATE CULTURE WILL BE HELD FOR 5 DAYS BEFORE ISSUING A FINAL NEGATIVE REPORT     Performed at [facility]   Report Status PENDING   Incomplete     Studies: No results found.  Scheduled Meds: . amiodarone  200 mg Oral Daily  . cefTRIAXone (ROCEPHIN)  IV  1 g Intravenous Q24H  . FLUoxetine  10 mg Oral Daily  . pantoprazole  40 mg Oral Daily  . warfarin  2.5 mg Oral ONCE-1800  . Warfarin - Pharmacist Dosing Inpatient   Does not apply q1800   Continuous Infusions: . sodium chloride 75 mL/hr at 05/07/13 0636    Principal Problem:   Sepsis Active Problems:   CARDIOMYOPATHY   Atrial fibrillation   CONGESTIVE HEART FAILURE   UTI (lower urinary tract infection)  Time spent:  Virginia Young K  [facility] Pager [phone]. If 7PM-7AM, please contact night-coverage at [email or web address], password [facility] 05/07/2013, 1:10 PM   LOS: 1 day

## 2013-05-08 DIAGNOSIS — A419 Sepsis, unspecified organism: Secondary | ICD-10-CM | POA: Diagnosis not present

## 2013-05-08 DIAGNOSIS — I509 Heart failure, unspecified: Secondary | ICD-10-CM | POA: Diagnosis not present

## 2013-05-08 DIAGNOSIS — N12 Tubulo-interstitial nephritis, not specified as acute or chronic: Secondary | ICD-10-CM | POA: Diagnosis not present

## 2013-05-08 DIAGNOSIS — I4891 Unspecified atrial fibrillation: Secondary | ICD-10-CM | POA: Diagnosis not present

## 2013-05-08 LAB — CBC WITH DIFFERENTIAL/PLATELET
Basophils Absolute: 0 10*3/uL (ref 0.0–0.1)
Basophils Relative: 0 % (ref 0–1)
Eosinophils Absolute: 0.1 10*3/uL (ref 0.0–0.7)
Eosinophils Relative: 1 % (ref 0–5)
HCT: 36.5 % (ref 36.0–46.0)
Hemoglobin: 12.3 g/dL (ref 12.0–15.0)
Lymphocytes Relative: 6 % — ABNORMAL LOW (ref 12–46)
Lymphs Abs: 1 10*3/uL (ref 0.7–4.0)
MCH: 29.3 pg (ref 26.0–34.0)
MCHC: 33.7 g/dL (ref 30.0–36.0)
MCV: 86.9 fL (ref 78.0–100.0)
Monocytes Absolute: 1.6 10*3/uL — ABNORMAL HIGH (ref 0.1–1.0)
Monocytes Relative: 9 % (ref 3–12)
Neutro Abs: 15.1 10*3/uL — ABNORMAL HIGH (ref 1.7–7.7)
Neutrophils Relative %: 85 % — ABNORMAL HIGH (ref 43–77)
Platelets: 152 10*3/uL (ref 150–400)
RBC: 4.2 MIL/uL (ref 3.87–5.11)
RDW: 14.1 % (ref 11.5–15.5)
WBC: 17.8 10*3/uL — ABNORMAL HIGH (ref 4.0–10.5)

## 2013-05-08 LAB — COMPREHENSIVE METABOLIC PANEL
ALT: 20 U/L (ref 0–35)
AST: 19 U/L (ref 0–37)
Albumin: 2.2 g/dL — ABNORMAL LOW (ref 3.5–5.2)
Alkaline Phosphatase: 95 U/L (ref 39–117)
BUN: 18 mg/dL (ref 6–23)
CO2: 29 mEq/L (ref 19–32)
Calcium: 8.5 mg/dL (ref 8.4–10.5)
Chloride: 99 mEq/L (ref 96–112)
Creatinine, Ser: 1.32 mg/dL — ABNORMAL HIGH (ref 0.50–1.10)
GFR calc Af Amer: 45 mL/min — ABNORMAL LOW (ref >90)
GFR calc non Af Amer: 39 mL/min — ABNORMAL LOW (ref >90)
Glucose, Bld: 124 mg/dL — ABNORMAL HIGH (ref 70–99)
Potassium: 4.2 mEq/L (ref 3.5–5.1)
Sodium: 133 mEq/L — ABNORMAL LOW (ref 135–145)
Total Bilirubin: 0.5 mg/dL (ref 0.3–1.2)
Total Protein: 5.7 g/dL — ABNORMAL LOW (ref 6.0–8.3)

## 2013-05-08 LAB — URINE CULTURE: Culture: >=100000

## 2013-05-08 LAB — PROTIME-INR
INR: 1.53 — ABNORMAL HIGH (ref 0.00–1.49)
Prothrombin Time: 18 seconds — ABNORMAL HIGH (ref 11.6–15.2)

## 2013-05-08 MED ORDER — WARFARIN SODIUM 5 MG PO TABS
5.0000 mg | ORAL_TABLET | Freq: Once | ORAL | Status: AC
  Administered 2013-05-08: 5 mg via ORAL
  Filled 2013-05-08: qty 1

## 2013-05-08 NOTE — Progress Notes (Signed)
PT Cancellation Note  Patient Details Name: Virginia Young MRN: [id number] DOB: 1941/05/29   Cancelled Treatment:    Reason Eval/Treat Not Completed: Fatigue/lethargy limiting ability to participate (has temp, does not feel like getting up.)has been up to BR/with OT earlier.   Rada Hay 05/08/2013, 3:26 PM Blanchard Kelch PT [phone]

## 2013-05-08 NOTE — Progress Notes (Signed)
ANTICOAGULATION CONSULT NOTE - Follow Up  Pharmacy Consult for warfarin Indication: atrial fibrillation  Allergies  Allergen Reactions  . Lipitor [Atorvastatin Calcium]     myalgia    Patient Measurements: Height: 5\' 9"  (175.3 cm) Weight: 170 lb 13.7 oz (77.5 kg) IBW/kg (Calculated) : 66.2   Vital Signs: Temp: 99.1 F (37.3 C) (11/15 0600) Temp src: Oral (11/15 0600) BP: 145/91 mmHg (11/15 0600) Pulse Rate: 102 (11/15 0600)  Labs:  Recent Labs  05/06/13 1424 05/07/13 0455 05/08/13 0530  HGB 13.0 11.9* 12.3  HCT 38.2 35.5* 36.5  PLT 159 159 152  LABPROT 19.5* 23.0* 18.0*  INR 1.70* 2.11* 1.53*  CREATININE 1.40* 1.49* 1.32*    Estimated Creatinine Clearance: 39.7 ml/min (by C-G formula based on Cr of 1.32).   Medical History: Past Medical History  Diagnosis Date  . NICM (nonischemic cardiomyopathy)     a. neg CLite in 2003;  b. EF 40-45% in past;   c.  Echo 12/11: EF 35-40%, mild MR, mild LAE, mild RAE, small pericardial effusion   . Mild mitral regurgitation   . HLD (hyperlipidemia)   . Chronic systolic heart failure   . Atrial fibrillation     a. coumadin;  b. Amiodarone  . Stasis ulcer   . Absence of menstruation   . Family history of ischemic heart disease   . Varicose veins   . Depression   . Osteoarthritis   . Chest pain 03/2012    a. Lex MV 10/13:  EF 64%, dist ant and apical defect sugg of soft tissue atten, no ischemia  . Cataract   . GERD (gastroesophageal reflux disease)     Medications:  Scheduled:  . amiodarone  200 mg Oral Daily  . cefTRIAXone (ROCEPHIN)  IV  1 g Intravenous Q24H  . FLUoxetine  10 mg Oral Daily  . pantoprazole  40 mg Oral Daily  . Warfarin - Pharmacist Dosing Inpatient   Does not apply q1800   Infusions:  . sodium chloride 75 mL/hr at 05/07/13 2253    Assessment: 72 yo female presented to ER with UTI to treat with Rocephin. Pharmacy consult to continue patient's warfarin as they take 2.5mg  daily except 5mg  on  TuesThursSat for hx Afib followed by [place] anticoagulation clinic. Last dose in the past week per med history list. Warfarin was not given 11/13 PM, pt reported taking a dose that morning before admission.   INR subtherapeutic and dropped after 2.5mg  warfarin yesterday  CBC stable, No reported bleeding  Drug interactions: amiodarone (home med so would not expect acute issues)  Goal of Therapy:  INR 2-3   Plan:  1) 5mg  warfarin today 2) Daily INR   Hessie Knows, PharmD, BCPS Pager [phone] 05/08/2013 8:34 AM

## 2013-05-08 NOTE — Progress Notes (Signed)
Occupational Therapy Evaluation Patient Details Name: Virginia Young MRN: [id number] DOB: 1940/12/29 Today's Date: 05/08/2013 Time: 4098-1191 OT Time Calculation (min): 20 min  OT Assessment / Plan / Recommendation History of present illness Patient admitted with sepsis/UTI   Clinical Impression   Patient weak, impulsive with decreased safety awareness. Has had 3 falls in last 2 days prior to admission.     OT Assessment  Patient needs continued OT Services    Follow Up Recommendations  SNF;Supervision/Assistance - 24 hour    Barriers to Discharge Decreased caregiver support lives alone, no family available   Equipment Recommendations  3 in 1 bedside comode;Tub/shower seat    Recommendations for Other Services    Frequency  Min 2X/week    Precautions / Restrictions Precautions Precautions: Fall Restrictions Weight Bearing Restrictions: No   Pertinent Vitals/Pain C/o soreness in B legs, not rated    ADL  Grooming: Performed;Wash/dry hands;Brushing hair;Minimal assistance Where Assessed - Grooming: Supported standing Upper Body Dressing: Performed;Moderate assistance Where Assessed - Upper Body Dressing: Supported sitting Toilet Transfer: Performed;Minimal assistance Toilet Transfer Method: Sit to stand;Stand pivot Acupuncturist: Regular height toilet Toileting - Clothing Manipulation and Hygiene: Performed;Min guard Where Assessed - Engineer, mining and Hygiene: Sit to stand from 3-in-1 or toilet Equipment Used: Rolling walker;Gait belt Transfers/Ambulation Related to ADLs: Patient needed cues for safety. Min A overall. To chair at end of session. ADL Comments: Patient has had 3 falls in last couple of days prior to admission. Safety is a concern. Patient and family open to SNF at discharge.    OT Diagnosis: Generalized weakness  OT Problem List: Decreased strength;Decreased activity tolerance;Impaired balance (sitting and/or  standing);Decreased safety awareness;Decreased knowledge of use of DME or AE OT Treatment Interventions: Self-care/ADL training;Therapeutic exercise;DME and/or AE instruction;Therapeutic activities;Patient/family education   OT Goals(Current goals can be found in the care plan section) Acute Rehab OT Goals Patient Stated Goal: agreeable to SNF for rehab OT Goal Formulation: With patient Time For Goal Achievement: 05/22/13 Potential to Achieve Goals: Good  Visit Information  Last OT Received On: 05/08/13 Assistance Needed: +1 History of Present Illness: Patient admitted with sepsis/UTI       Prior Functioning     Home Living Family/patient expects to be discharged to:: Private residence Living Arrangements: Alone Type of Home: House Home Access: Stairs to enter Secretary/administrator of Steps: 6-8 Home Layout: One level Home Equipment: Other (comment) (she says she may have a walker "in the closet somewhere") Prior Function Level of Independence: Independent Communication Communication: No difficulties Dominant Hand: Right         Vision/Perception Vision - History Baseline Vision: No visual deficits   Cognition  Cognition Arousal/Alertness: Awake/alert Behavior During Therapy: Impulsive Overall Cognitive Status: Impaired/Different from baseline Area of Impairment: Safety/judgement Safety/Judgement: Decreased awareness of safety    Extremity/Trunk Assessment Upper Extremity Assessment Upper Extremity Assessment: Generalized weakness;Overall WFL for tasks assessed     Mobility       Exercise     Balance     End of Session OT - End of Session Equipment Utilized During Treatment: Gait belt;Rolling walker Activity Tolerance: Patient tolerated treatment well Patient left: in chair;with call bell/phone within reach;with family/visitor present Nurse Communication: Mobility status  GO     Keaghan Bowens A 05/08/2013, 2:14 PM

## 2013-05-08 NOTE — Progress Notes (Signed)
[facility] PROGRESS NOTE  Virginia Young ZOX:[id number] DOB: March 24, 1941 DOA: 05/06/2013 PCP: Sonda Primes, MD  Assessment/Plan: 1. Sepsis with UTI  1. Urine cx with >100,000 ecoli, sensitivities pending 2. Cont rocephin for now 3. Blood cx pending 4. WBC trending down nicely 5. Volume resuscitate as tolerated, mindful of hx of cardiomyopathy 2. Afib  1. On coumadin 2. No signs of bleed 3. Currently rate controlled 3. Cardiomyopathy  1. Euvolemic to slightly hypovolemic 2. Gentle IVF as tolerated 4. DVT prophylaxis  1. On coumadin  Code Status: Full Family Communication:  Pt in room Disposition Plan: Pending - Family concerned that given pt's weakness, she may need placement. Family and patient agreeable to SNF if recommended by PT.  Antibiotics:  Rocephin 05/06/13>>>  HPI/Subjective: No acute events overnight. Still feels weak.  Objective: Filed Vitals:   05/07/13 1140 05/07/13 1317 05/07/13 2200 05/08/13 0600  BP: 136/68 106/61 118/70 145/91  Pulse: 70 87 98 102  Temp:  100 F (37.8 C) 98.1 F (36.7 C) 99.1 F (37.3 C)  TempSrc:  Oral Oral Oral  Resp: 18 18 20 20   Height:      Weight:      SpO2:  90% 92% 92%    Intake/Output Summary (Last 24 hours) at 05/08/13 1257 Last data filed at 05/08/13 0900  Gross per 24 hour  Intake 2091.25 ml  Output      0 ml  Net 2091.25 ml   Filed Weights   05/07/13 0700  Weight: 77.5 kg (170 lb 13.7 oz)    Exam:   General:  Awake,in nad  Cardiovascular: regular, s1, s2  Respiratory: normal resp effort, no wheezing  Abdomen: Soft, nondistended  Musculoskeletal: perfused, no clubbing   Data Reviewed: Basic Metabolic Panel:  Recent Labs Lab 05/06/13 1424 05/07/13 0455 05/08/13 0530  NA 131* 134* 133*  K 3.7 4.1 4.2  CL 94* 99 99  CO2 27 29 29   GLUCOSE 206* 160* 124*  BUN 17 20 18   CREATININE 1.40* 1.49* 1.32*  CALCIUM 9.0 8.9 8.5   Liver Function Tests:  Recent Labs Lab  05/06/13 1424 05/07/13 0455 05/08/13 0530  AST 26 26 19   ALT 17 18 20   ALKPHOS 97 94 95  BILITOT 0.9 0.5 0.5  PROT 5.9* 5.4* 5.7*  ALBUMIN 2.7* 2.2* 2.2*   No results found for this basename: LIPASE, AMYLASE,  in the last 168 hours No results found for this basename: AMMONIA,  in the last 168 hours CBC:  Recent Labs Lab 05/06/13 1424 05/07/13 0455 05/08/13 0530  WBC 23.9* 24.9* 17.8*  NEUTROABS 21.0*  --  15.1*  HGB 13.0 11.9* 12.3  HCT 38.2 35.5* 36.5  MCV 87.0 87.7 86.9  PLT 159 159 152   Cardiac Enzymes: No results found for this basename: CKTOTAL, CKMB, CKMBINDEX, TROPONINI,  in the last 168 hours BNP (last 3 results) No results found for this basename: PROBNP,  in the last 8760 hours CBG: No results found for this basename: GLUCAP,  in the last 168 hours  Recent Results (from the past 240 hour(s))  URINE CULTURE     Status: None   Collection Time    05/06/13  2:42 PM      Result Value Range Status   Specimen Description URINE, CATHETERIZED   Final   Special Requests NONE   Final   Culture  Setup Time     Final   Value: 05/07/2013 01:28     Performed at [facility]   Culture     Final   Value: >=100,000 COLONIES/mL ESCHERICHIA COLI     Performed at [facility]   Report Status 05/08/2013 FINAL   Final   Organism ID, Bacteria ESCHERICHIA COLI   Final  CULTURE, BLOOD (ROUTINE X 2)     Status: None   Collection Time    05/06/13  4:37 PM      Result Value Range Status   Specimen Description BLOOD RIGHT ANTECUBITAL   Final   Special Requests NONE BOTTLES DRAWN AEROBIC AND ANAEROBIC [facility]   Final   Culture  Setup Time     Final   Value: 05/06/2013 23:26     Performed at [facility]   Culture     Final   Value:        BLOOD CULTURE RECEIVED NO GROWTH TO DATE CULTURE WILL BE HELD FOR 5 DAYS BEFORE ISSUING A FINAL NEGATIVE REPORT     Performed at [facility]   Report Status PENDING   Incomplete     Studies: No results  found.  Scheduled Meds: . amiodarone  200 mg Oral Daily  . cefTRIAXone (ROCEPHIN)  IV  1 g Intravenous Q24H  . FLUoxetine  10 mg Oral Daily  . pantoprazole  40 mg Oral Daily  . warfarin  5 mg Oral ONCE-1800  . Warfarin - Pharmacist Dosing Inpatient   Does not apply q1800   Continuous Infusions: . sodium chloride 75 mL/hr at 05/07/13 2253    Principal Problem:   Sepsis Active Problems:   CARDIOMYOPATHY   Atrial fibrillation   CONGESTIVE HEART FAILURE   UTI (lower urinary tract infection)  Time spent:  Don Giarrusso K  [facility] Pager [phone]. If 7PM-7AM, please contact night-coverage at [email or web address], password [facility] 05/08/2013, 12:57 PM  LOS: 2 days

## 2013-05-09 DIAGNOSIS — I509 Heart failure, unspecified: Secondary | ICD-10-CM | POA: Diagnosis not present

## 2013-05-09 DIAGNOSIS — I4891 Unspecified atrial fibrillation: Secondary | ICD-10-CM | POA: Diagnosis not present

## 2013-05-09 DIAGNOSIS — F329 Major depressive disorder, single episode, unspecified: Secondary | ICD-10-CM | POA: Diagnosis not present

## 2013-05-09 DIAGNOSIS — N12 Tubulo-interstitial nephritis, not specified as acute or chronic: Secondary | ICD-10-CM | POA: Diagnosis not present

## 2013-05-09 LAB — CBC
HCT: 33.5 % — ABNORMAL LOW (ref 36.0–46.0)
Hemoglobin: 11.5 g/dL — ABNORMAL LOW (ref 12.0–15.0)
MCH: 29.8 pg (ref 26.0–34.0)
MCHC: 34.3 g/dL (ref 30.0–36.0)
MCV: 86.8 fL (ref 78.0–100.0)
Platelets: 160 10*3/uL (ref 150–400)
RBC: 3.86 MIL/uL — ABNORMAL LOW (ref 3.87–5.11)
RDW: 14.1 % (ref 11.5–15.5)
WBC: 16.2 10*3/uL — ABNORMAL HIGH (ref 4.0–10.5)

## 2013-05-09 LAB — PROTIME-INR
INR: 1.77 — ABNORMAL HIGH (ref 0.00–1.49)
Prothrombin Time: 20.1 seconds — ABNORMAL HIGH (ref 11.6–15.2)

## 2013-05-09 MED ORDER — WARFARIN SODIUM 5 MG PO TABS
5.0000 mg | ORAL_TABLET | Freq: Once | ORAL | Status: AC
  Administered 2013-05-09: 5 mg via ORAL
  Filled 2013-05-09: qty 1

## 2013-05-09 MED ORDER — CIPROFLOXACIN HCL 500 MG PO TABS
500.0000 mg | ORAL_TABLET | Freq: Two times a day (BID) | ORAL | Status: DC
  Administered 2013-05-09 (×2): 500 mg via ORAL
  Filled 2013-05-09 (×5): qty 1

## 2013-05-09 NOTE — Progress Notes (Addendum)
ANTICOAGULATION CONSULT NOTE - Follow Up  Pharmacy Consult for warfarin Indication: atrial fibrillation  Allergies  Allergen Reactions  . Lipitor [Atorvastatin Calcium]     myalgia    Patient Measurements: Height: 5\' 9"  (175.3 cm) Weight: 170 lb 13.7 oz (77.5 kg) IBW/kg (Calculated) : 66.2   Vital Signs: Temp: 98.3 F (36.8 C) (11/16 0548) Temp src: Oral (11/16 0548) BP: 129/77 mmHg (11/16 0548) Pulse Rate: 89 (11/16 0548)  Labs:  Recent Labs  05/06/13 1424 05/07/13 0455 05/08/13 0530 05/09/13 0539  HGB 13.0 11.9* 12.3 11.5*  HCT 38.2 35.5* 36.5 33.5*  PLT 159 159 152 160  LABPROT 19.5* 23.0* 18.0* 20.1*  INR 1.70* 2.11* 1.53* 1.77*  CREATININE 1.40* 1.49* 1.32*  --     Estimated Creatinine Clearance: 39.7 ml/min (by C-G formula based on Cr of 1.32).   Medical History: Past Medical History  Diagnosis Date  . NICM (nonischemic cardiomyopathy)     a. neg CLite in 2003;  b. EF 40-45% in past;   c.  Echo 12/11: EF 35-40%, mild MR, mild LAE, mild RAE, small pericardial effusion   . Mild mitral regurgitation   . HLD (hyperlipidemia)   . Chronic systolic heart failure   . Atrial fibrillation     a. coumadin;  b. Amiodarone  . Stasis ulcer   . Absence of menstruation   . Family history of ischemic heart disease   . Varicose veins   . Depression   . Osteoarthritis   . Chest pain 03/2012    a. Lex MV 10/13:  EF 64%, dist ant and apical defect sugg of soft tissue atten, no ischemia  . Cataract   . GERD (gastroesophageal reflux disease)     Medications:  Scheduled:  . amiodarone  200 mg Oral Daily  . ciprofloxacin  500 mg Oral BID  . FLUoxetine  10 mg Oral Daily  . pantoprazole  40 mg Oral Daily  . Warfarin - Pharmacist Dosing Inpatient   Does not apply q1800   Infusions:  . sodium chloride 75 mL/hr at 05/09/13 0424    Assessment: 72 yo female presented to ER with UTI to treat with Rocephin. Pharmacy consult to continue patient's warfarin as they take  2.5mg  daily except 5mg  on TuesThursSat for hx Afib followed by [place] anticoagulation clinic. Last dose in the past week per med history list. Warfarin was not given 11/13 PM, pt reported taking a dose that morning before admission.   INR subtherapeutic but rising now - has received 2.5mg  and 5mg  as inpatient so far  CBC stable, No reported bleeding  Drug interactions: amiodarone (home med so would not expect acute issues) and now cipro started for UTI and this can potentially increase INR so will monitor closely  Goal of Therapy:  INR 2-3   Plan:  1) Repeat 5mg  warfarin tonight 2) Daily INR   Hessie Knows, PharmD, BCPS Pager [phone] 05/09/2013 10:31 AM

## 2013-05-09 NOTE — Progress Notes (Addendum)
Clinical Social Work Department CLINICAL SOCIAL WORK PLACEMENT NOTE 05/09/2013  Patient:  Virginia Young, Virginia Young  Account Number:  [id number] Admit date:  05/06/2013  Clinical Social Worker:  Doroteo Glassman  Date/time:  05/09/2013 03:24 PM  Clinical Social Work is seeking post-discharge placement for this patient at the following level of care:   SKILLED NURSING   (*CSW will update this form in Epic as items are completed)   05/09/2013  Patient/family provided with [facility] Department of Clinical Social Work's list of facilities offering this level of care within the geographic area requested by the patient (or if unable, by the patient's family).  05/09/2013  Patient/family informed of their freedom to choose among providers that offer the needed level of care, that participate in Medicare, Medicaid or managed care program needed by the patient, have an available bed and are willing to accept the patient.  05/09/2013  Patient/family informed of [facility]' ownership interest in [facility], as well as of the fact that they are under no obligation to receive care at this facility.  PASARR submitted to EDS on 05/09/2013 PASARR number received from EDS on 05/09/2013  FL2 transmitted to all facilities in geographic area requested by pt/family on  05/09/2013 FL2 transmitted to all facilities within larger geographic area on   Patient informed that his/her managed care company has contracts with or will negotiate with  certain facilities, including the following:     Patient/family informed of bed offers received:  05/10/13 Patient chooses bed at [facility] Physician recommends and patient chooses bed at    Patient to be transferred [facility]  on  05/10/13 Patient to be transferred to facility by [facility]  The following physician request were entered in Epic:   Additional Comments:  Providence Crosby, Theresia Majors Clinical Social Work [phone]

## 2013-05-09 NOTE — Progress Notes (Signed)
Clinical Social Work Department BRIEF PSYCHOSOCIAL ASSESSMENT 05/09/2013  Patient:  Virginia Young, Virginia Young     Account Number:  [id number]     Admit date:  05/06/2013  Clinical Social Worker:  Doroteo Glassman  Date/Time:  05/09/2013 03:20 PM  Referred by:  Physician  Date Referred:  05/09/2013 Referred for  SNF Placement   Other Referral:   Interview type:  Patient Other interview type:   Pt's daughter, Virginia Young    PSYCHOSOCIAL DATA Living Status:  ALONE Admitted from facility:   Level of care:   Primary support name:  Virginia Young Primary support relationship to patient:  CHILD, ADULT Degree of support available:   strong    CURRENT CONCERNS Current Concerns  Post-Acute Placement   Other Concerns:    SOCIAL WORK ASSESSMENT / PLAN Met with Pt and her daughter to discuss d/c plans.    Discussed SNF option and procedures.  Pt agreeable to SNF and gave CSW permission to begin the SNF search.    Provided Pt and daughter with SNF list.    Pt asked that her daughter be given bed offers. Virginia Young stated that she can be reached at her work number tomorrow during the day.    CSW thanked Pt and her daughter for their time.   Assessment/plan status:  Psychosocial Support/Ongoing Assessment of Needs Other assessment/ plan:   Information/referral to community resources:   SNF list    PATIENT'S/FAMILY'S RESPONSE TO PLAN OF CARE: Pt's response to her plan of care was acceptance.  Pt understands that she's weak and that going home isn't in her best interest.    Pt is coping well with her medical situation and has a strong support system.    Pt was cooperative and euthymic.   Providence Crosby, LCSWA Clinical Social Work [phone]

## 2013-05-09 NOTE — Progress Notes (Signed)
[facility] PROGRESS NOTE  Virginia Young XBM:[id number] DOB: 01/14/1941 DOA: 05/06/2013 PCP: Sonda Primes, MD  Assessment/Plan: 1. Sepsis with UTI  1. Urine cx with >100,000 ecoli, pan-sensitive but resistant to bactrim 2. Improving with rocephin 3. Blood cx neg 4. WBC trending down nicely 5. Volume resuscitate as tolerated, mindful of hx of cardiomyopathy 6. Consider transitioning to PO cipro based on sensitivities 2. Afib  1. On coumadin 2. No signs of bleed 3. Currently rate controlled 3. Cardiomyopathy  1. Euvolemic to slightly hypovolemic 2. Gentle IVF as tolerated 4. DVT prophylaxis  1. On coumadin  Code Status: Full Family Communication:  Pt in room Disposition Plan: Pending - Pending poss SNF  Antibiotics:  Rocephin 05/06/13>>>05/09/13  Ciprofloxacin 05/09/13>>>  HPI/Subjective: No acute events overnight. Still feels weak.  Objective: Filed Vitals:   05/08/13 1300 05/08/13 1549 05/08/13 2224 05/09/13 0548  BP: 133/71  155/81 129/77  Pulse: 115  88 89  Temp: 101.4 F (38.6 C) 98.9 F (37.2 C) 98.2 F (36.8 C) 98.3 F (36.8 C)  TempSrc: Oral Oral Oral Oral  Resp: 20  20 18   Height:      Weight:      SpO2: 92%  97% 93%    Intake/Output Summary (Last 24 hours) at 05/09/13 1015 Last data filed at 05/09/13 0272  Gross per 24 hour  Intake      0 ml  Output    350 ml  Net   -350 ml   Filed Weights   05/07/13 0700  Weight: 77.5 kg (170 lb 13.7 oz)    Exam:   General:  Awake,in nad  Cardiovascular: regular, s1, s2  Respiratory: normal resp effort, no wheezing  Abdomen: Soft, nondistended  Musculoskeletal: perfused, no clubbing   Data Reviewed: Basic Metabolic Panel:  Recent Labs Lab 05/06/13 1424 05/07/13 0455 05/08/13 0530  NA 131* 134* 133*  K 3.7 4.1 4.2  CL 94* 99 99  CO2 27 29 29   GLUCOSE 206* 160* 124*  BUN 17 20 18   CREATININE 1.40* 1.49* 1.32*  CALCIUM 9.0 8.9 8.5   Liver Function Tests:  Recent  Labs Lab 05/06/13 1424 05/07/13 0455 05/08/13 0530  AST 26 26 19   ALT 17 18 20   ALKPHOS 97 94 95  BILITOT 0.9 0.5 0.5  PROT 5.9* 5.4* 5.7*  ALBUMIN 2.7* 2.2* 2.2*   No results found for this basename: LIPASE, AMYLASE,  in the last 168 hours No results found for this basename: AMMONIA,  in the last 168 hours CBC:  Recent Labs Lab 05/06/13 1424 05/07/13 0455 05/08/13 0530 05/09/13 0539  WBC 23.9* 24.9* 17.8* 16.2*  NEUTROABS 21.0*  --  15.1*  --   HGB 13.0 11.9* 12.3 11.5*  HCT 38.2 35.5* 36.5 33.5*  MCV 87.0 87.7 86.9 86.8  PLT 159 159 152 160   Cardiac Enzymes: No results found for this basename: CKTOTAL, CKMB, CKMBINDEX, TROPONINI,  in the last 168 hours BNP (last 3 results) No results found for this basename: PROBNP,  in the last 8760 hours CBG: No results found for this basename: GLUCAP,  in the last 168 hours  Recent Results (from the past 240 hour(s))  URINE CULTURE     Status: None   Collection Time    05/06/13  2:42 PM      Result Value Range Status   Specimen Description URINE, CATHETERIZED   Final   Special Requests NONE   Final   Culture  Setup Time  Final   Value: 05/07/2013 01:28     Performed at [facility]   Culture     Final   Value: >=100,000 COLONIES/mL ESCHERICHIA COLI     Performed at [facility]   Report Status 05/08/2013 FINAL   Final   Organism ID, Bacteria ESCHERICHIA COLI   Final  CULTURE, BLOOD (ROUTINE X 2)     Status: None   Collection Time    05/06/13  4:32 PM      Result Value Range Status   Specimen Description BLOOD R HAND   Final   Special Requests NONE BOTTLES DRAWN AEROBIC AND ANAEROBIC 4CC   Final   Culture  Setup Time     Final   Value: 05/07/2013 00:13     Performed at [facility]   Culture     Final   Value:        BLOOD CULTURE RECEIVED NO GROWTH TO DATE CULTURE WILL BE HELD FOR 5 DAYS BEFORE ISSUING A FINAL NEGATIVE REPORT     Performed at [facility]   Report Status PENDING    Incomplete  CULTURE, BLOOD (ROUTINE X 2)     Status: None   Collection Time    05/06/13  4:37 PM      Result Value Range Status   Specimen Description BLOOD RIGHT ANTECUBITAL   Final   Special Requests NONE BOTTLES DRAWN AEROBIC AND ANAEROBIC [facility]   Final   Culture  Setup Time     Final   Value: 05/06/2013 23:26     Performed at [facility]   Culture     Final   Value:        BLOOD CULTURE RECEIVED NO GROWTH TO DATE CULTURE WILL BE HELD FOR 5 DAYS BEFORE ISSUING A FINAL NEGATIVE REPORT     Performed at [facility]   Report Status PENDING   Incomplete     Studies: No results found.  Scheduled Meds: . amiodarone  200 mg Oral Daily  . ciprofloxacin  500 mg Oral BID  . FLUoxetine  10 mg Oral Daily  . pantoprazole  40 mg Oral Daily  . Warfarin - Pharmacist Dosing Inpatient   Does not apply q1800   Continuous Infusions: . sodium chloride 75 mL/hr at 05/09/13 0424    Principal Problem:   Sepsis Active Problems:   CARDIOMYOPATHY   Atrial fibrillation   CONGESTIVE HEART FAILURE   UTI (lower urinary tract infection)  Time spent:  Lindyn Vossler K  [facility] Pager [phone]. If 7PM-7AM, please contact night-coverage at [email or web address], password [facility] 05/09/2013, 10:15 AM  LOS: 3 days

## 2013-05-09 NOTE — Evaluation (Signed)
Physical Therapy Evaluation Patient Details Name: Virginia Young MRN: [id number] DOB: [birth year] Today's Date: 05/09/2013 Time: 7829-5621 PT Time Calculation (min): 17 min  PT Assessment / Plan / Recommendation History of Present Illness  Patient admitted with sepsis/UTI  Clinical Impression  Patient presents with decreased independence and safety with mobility due to deficits listed below.  Feel she will benefit from skilled PT in the acute setting to maximize independence and allow eventual return home independent following rehab stay.    PT Assessment  Patient needs continued PT services    Follow Up Recommendations  SNF    Does the patient have the potential to tolerate intense rehabilitation    N/A  Barriers to Discharge Decreased caregiver support      Equipment Recommendations  None recommended by PT    Recommendations for Other Services   None  Frequency Min 3X/week    Precautions / Restrictions Precautions Precautions: Fall Precaution Comments: Has had falls at home   Pertinent Vitals/Pain C/o knee pain and nausea      Mobility  Bed Mobility Bed Mobility: Supine to Sit Supine to Sit: 5: Supervision;HOB flat Transfers Transfers: Sit to Stand;Stand to Sit Sit to Stand: 4: Min guard;From bed Stand to Sit: 4: Min guard;To chair/3-in-1;To toilet Details for Transfer Assistance: assist for safety Ambulation/Gait Ambulation/Gait Assistance: 5: Supervision Ambulation Distance (Feet): 140 Feet Assistive device: Rolling walker Ambulation/Gait Assistance Details: mildly unsteady on feet, c/o general weakness Gait Pattern: Step-through pattern;Trunk flexed;Decreased stride length        PT Diagnosis: Abnormality of gait;Generalized weakness  PT Problem List: Decreased strength;Decreased knowledge of use of DME;Decreased activity tolerance;Decreased safety awareness;Decreased balance;Decreased mobility PT Treatment Interventions: DME instruction;Balance  training;Gait training;Functional mobility training;Patient/family education;Therapeutic activities;Therapeutic exercise     PT Goals(Current goals can be found in the care plan section) Acute Rehab PT Goals Patient Stated Goal: agreeable to SNF for rehab PT Goal Formulation: With patient/family Time For Goal Achievement: 05/23/13 Potential to Achieve Goals: Good  Visit Information  Last PT Received On: 05/09/13 Assistance Needed: +1 History of Present Illness: Patient admitted with sepsis/UTI       Prior Functioning  Home Living Family/patient expects to be discharged to:: Private residence Living Arrangements: Alone Type of Home: House Home Access: Stairs to enter;Ramped entrance Entrance Stairs-Number of Steps: 6-8 Home Layout: One level Home Equipment: Environmental consultant - 2 wheels Prior Function Level of Independence: Independent Communication Communication: No difficulties Dominant Hand: Right    Cognition  Cognition Arousal/Alertness: Awake/alert Behavior During Therapy: WFL for tasks assessed/performed Overall Cognitive Status: Within Functional Limits for tasks assessed    Extremity/Trunk Assessment Lower Extremity Assessment Lower Extremity Assessment: Generalized weakness   Balance Balance Balance Assessed: Yes Static Standing Balance Static Standing - Balance Support: Bilateral upper extremity supported Static Standing - Level of Assistance: 5: Stand by assistance Static Standing - Comment/# of Minutes: needs close supervision for safety  End of Session PT - End of Session Equipment Utilized During Treatment: Gait belt Activity Tolerance: Patient limited by fatigue Patient left: in chair;with family/visitor present;with call bell/phone within reach  GP     [facility] 05/09/2013, 4:57 PM [place], PT [phone] 05/09/2013

## 2013-05-10 ENCOUNTER — Non-Acute Institutional Stay (SKILLED_NURSING_FACILITY): Payer: Medicare Other | Admitting: Internal Medicine

## 2013-05-10 DIAGNOSIS — A498 Other bacterial infections of unspecified site: Secondary | ICD-10-CM | POA: Diagnosis not present

## 2013-05-10 DIAGNOSIS — M6281 Muscle weakness (generalized): Secondary | ICD-10-CM | POA: Diagnosis not present

## 2013-05-10 DIAGNOSIS — I4891 Unspecified atrial fibrillation: Secondary | ICD-10-CM

## 2013-05-10 DIAGNOSIS — A4159 Other Gram-negative sepsis: Secondary | ICD-10-CM | POA: Diagnosis not present

## 2013-05-10 DIAGNOSIS — Z5189 Encounter for other specified aftercare: Secondary | ICD-10-CM | POA: Diagnosis not present

## 2013-05-10 DIAGNOSIS — F3289 Other specified depressive episodes: Secondary | ICD-10-CM

## 2013-05-10 DIAGNOSIS — Z7901 Long term (current) use of anticoagulants: Secondary | ICD-10-CM | POA: Diagnosis not present

## 2013-05-10 DIAGNOSIS — K219 Gastro-esophageal reflux disease without esophagitis: Secondary | ICD-10-CM | POA: Diagnosis not present

## 2013-05-10 DIAGNOSIS — I509 Heart failure, unspecified: Secondary | ICD-10-CM | POA: Diagnosis not present

## 2013-05-10 DIAGNOSIS — R413 Other amnesia: Secondary | ICD-10-CM

## 2013-05-10 DIAGNOSIS — N390 Urinary tract infection, site not specified: Secondary | ICD-10-CM | POA: Diagnosis not present

## 2013-05-10 DIAGNOSIS — E7800 Pure hypercholesterolemia, unspecified: Secondary | ICD-10-CM | POA: Diagnosis not present

## 2013-05-10 DIAGNOSIS — A419 Sepsis, unspecified organism: Secondary | ICD-10-CM | POA: Diagnosis not present

## 2013-05-10 DIAGNOSIS — I428 Other cardiomyopathies: Secondary | ICD-10-CM | POA: Diagnosis not present

## 2013-05-10 DIAGNOSIS — F329 Major depressive disorder, single episode, unspecified: Secondary | ICD-10-CM

## 2013-05-10 DIAGNOSIS — N12 Tubulo-interstitial nephritis, not specified as acute or chronic: Secondary | ICD-10-CM | POA: Diagnosis not present

## 2013-05-10 LAB — PROTIME-INR
INR: 2.02 — ABNORMAL HIGH (ref 0.00–1.49)
Prothrombin Time: 22.2 seconds — ABNORMAL HIGH (ref 11.6–15.2)

## 2013-05-10 LAB — CBC
HCT: 33.7 % — ABNORMAL LOW (ref 36.0–46.0)
Hemoglobin: 11.3 g/dL — ABNORMAL LOW (ref 12.0–15.0)
MCH: 29 pg (ref 26.0–34.0)
MCHC: 33.5 g/dL (ref 30.0–36.0)
MCV: 86.4 fL (ref 78.0–100.0)
Platelets: 179 10*3/uL (ref 150–400)
RBC: 3.9 MIL/uL (ref 3.87–5.11)
RDW: 14.2 % (ref 11.5–15.5)
WBC: 16.7 10*3/uL — ABNORMAL HIGH (ref 4.0–10.5)

## 2013-05-10 MED ORDER — ONDANSETRON HCL 4 MG PO TABS: 4.0000 mg | ORAL_TABLET | Freq: Three times a day (TID) | ORAL | Status: DC | PRN

## 2013-05-10 MED ORDER — WARFARIN SODIUM 2.5 MG PO TABS
2.5000 mg | ORAL_TABLET | Freq: Once | ORAL | Status: DC
  Filled 2013-05-10: qty 1

## 2013-05-10 MED ORDER — CEFPODOXIME PROXETIL 200 MG PO TABS
200.0000 mg | ORAL_TABLET | Freq: Two times a day (BID) | ORAL | Status: DC
  Administered 2013-05-10: 200 mg via ORAL
  Filled 2013-05-10 (×2): qty 1

## 2013-05-10 MED ORDER — CEFPODOXIME PROXETIL 200 MG PO TABS: 200.0000 mg | ORAL_TABLET | Freq: Two times a day (BID) | ORAL | Status: DC

## 2013-05-10 NOTE — Progress Notes (Signed)
Clinical Social Work  CSW met with patient at bedside and spoke with dtr via phone in order to provide bed offers. Patient and dtr agreeable to placement at [facility]. CSW faxed DC summary to [facility] who is agreeable to admission. CSW prepared DC packet with FL2 and hard scripts included. CSW informed patient, dtr, and RN who are all agreeable to DC plans. CSW coordinated transportation via PTAR and patient and family aware of no guarantee of payment for transportation through insurance. PTAR Request #: [id number].  CSW is signing off but available if needed.  [place], [place] [phone]

## 2013-05-10 NOTE — Discharge Summary (Signed)
Physician Discharge Summary  Virginia Young ZOX:[id number] DOB: Feb 24, 1941 DOA: 05/06/2013  PCP: Sonda Primes, MD  Admit date: 05/06/2013 Discharge date: 05/10/2013  Time spent: 35 minutes  Recommendations for Outpatient Follow-up:  1. Follow up with PCP in 1-2 weeks  Discharge Diagnoses:  Principal Problem:   Sepsis Active Problems:   CARDIOMYOPATHY   Atrial fibrillation   CONGESTIVE HEART FAILURE   UTI (lower urinary tract infection)   Discharge Condition: Improved  Diet recommendation: Cardiac  Filed Weights   05/07/13 0700  Weight: 77.5 kg (170 lb 13.7 oz)    History of present illness:  Virginia Young is a 72 y.o. female  With a hx of multiple prior UTI's treated as an outpatient, afib on coumadin, and hx of cardiomyopathy who presents to the ED with worsening suprapubic pain. Pt was recently treated as an outpatient with bactrim for UTI. In the ED, the pt was noted to have a WBC of over 23K, temp of over 101F, and a UA suggestive of UTI. The patient was given a dose of rocephin and the hospitalists were consulted for admission  Hospital Course:  1. Sepsis with UTI  1. Urine cx with >100,000 ecoli, pan-sensitive but resistant to bactrim 2. Improved with rocephin 3. Blood cx neg 4. WBC trending down nicely with antibiotics 5. Initially transitioned to PO cipro based on sensitivities, however, pt reported nausea so started on Vantin 200mg  BID 2. Afib  1. Cont on coumadin 2. No signs of bleed 3. Currently rate controlled 3. Cardiomyopathy  1. Euvolemic to slightly hypovolemic 2. Gentle IVF as tolerated 4. DVT prophylaxis  1. Cont on coumadin  Discharge Exam: Filed Vitals:   05/09/13 0548 05/09/13 1439 05/09/13 2215 05/10/13 0510  BP: 129/77 152/74 152/97 133/83  Pulse: 89 82 90 93  Temp: 98.3 F (36.8 C) 97.7 F (36.5 C) 98.4 F (36.9 C) 97.9 F (36.6 C)  TempSrc: Oral Oral Oral Oral  Resp: 18 20 20 18   Height:      Weight:      SpO2: 93%  95% 95% 95%    General: Awake, in nad Cardiovascular: regular, s1, s2 Respiratory: normal resp effort, no wheezing  Discharge Instructions       Future Appointments Provider Department Dept Phone   05/17/2013 11:15 AM [facility] [place] Office [phone]   06/29/2013 1:30 PM Tresa Garter, MD [facility] [phone]       Medication List         amiodarone 200 MG tablet  Commonly known as:  PACERONE  take 1 tablet by mouth once daily     cefpodoxime 200 MG tablet  Commonly known as:  VANTIN  Take 1 tablet (200 mg total) by mouth every 12 (twelve) hours.     donepezil 5 MG tablet  Commonly known as:  ARICEPT  take 1 tablet by mouth at bedtime     FLUoxetine 10 MG tablet  Commonly known as:  PROZAC  Take 1 tablet (10 mg total) by mouth daily.     omeprazole 40 MG capsule  Commonly known as:  PRILOSEC  take 1 capsule by mouth once daily     Vitamin D3 2000 UNITS Tabs  Take 1 tablet by mouth daily.     warfarin 5 MG tablet  Commonly known as:  COUMADIN  Take 2.5-5 mg by mouth daily. Takes 5mg  Tuesday, Thursday, and Saturday. Takes 2.5mg  all other days.  Allergies  Allergen Reactions  . Lipitor [Atorvastatin Calcium]     myalgia      The results of significant diagnostics from this hospitalization (including imaging, microbiology, ancillary and laboratory) are listed below for reference.    Significant Diagnostic Studies: No results found.  Microbiology: Recent Results (from the past 240 hour(s))  URINE CULTURE     Status: None   Collection Time    05/06/13  2:42 PM      Result Value Range Status   Specimen Description URINE, CATHETERIZED   Final   Special Requests NONE   Final   Culture  Setup Time     Final   Value: 05/07/2013 01:28     Performed at [facility]   Culture     Final   Value: >=100,000 COLONIES/mL ESCHERICHIA COLI     Performed at [facility]    Report Status 05/08/2013 FINAL   Final   Organism ID, Bacteria ESCHERICHIA COLI   Final  CULTURE, BLOOD (ROUTINE X 2)     Status: None   Collection Time    05/06/13  4:32 PM      Result Value Range Status   Specimen Description BLOOD R HAND   Final   Special Requests NONE BOTTLES DRAWN AEROBIC AND ANAEROBIC 4CC   Final   Culture  Setup Time     Final   Value: 05/07/2013 00:13     Performed at [facility]   Culture     Final   Value:        BLOOD CULTURE RECEIVED NO GROWTH TO DATE CULTURE WILL BE HELD FOR 5 DAYS BEFORE ISSUING A FINAL NEGATIVE REPORT     Performed at [facility]   Report Status PENDING   Incomplete  CULTURE, BLOOD (ROUTINE X 2)     Status: None   Collection Time    05/06/13  4:37 PM      Result Value Range Status   Specimen Description BLOOD RIGHT ANTECUBITAL   Final   Special Requests NONE BOTTLES DRAWN AEROBIC AND ANAEROBIC 7CC   Final   Culture  Setup Time     Final   Value: 05/06/2013 23:26     Performed at [facility]   Culture     Final   Value:        BLOOD CULTURE RECEIVED NO GROWTH TO DATE CULTURE WILL BE HELD FOR 5 DAYS BEFORE ISSUING A FINAL NEGATIVE REPORT     Performed at [facility]   Report Status PENDING   Incomplete     Labs: Basic Metabolic Panel:  Recent Labs Lab 05/06/13 1424 05/07/13 0455 05/08/13 0530  NA 131* 134* 133*  K 3.7 4.1 4.2  CL 94* 99 99  CO2 27 29 29   GLUCOSE 206* 160* 124*  BUN 17 20 18   CREATININE 1.40* 1.49* 1.32*  CALCIUM 9.0 8.9 8.5   Liver Function Tests:  Recent Labs Lab 05/06/13 1424 05/07/13 0455 05/08/13 0530  AST 26 26 19   ALT 17 18 20   ALKPHOS 97 94 95  BILITOT 0.9 0.5 0.5  PROT 5.9* 5.4* 5.7*  ALBUMIN 2.7* 2.2* 2.2*   No results found for this basename: LIPASE, AMYLASE,  in the last 168 hours No results found for this basename: AMMONIA,  in the last 168 hours CBC:  Recent Labs Lab 05/06/13 1424 05/07/13 0455 05/08/13 0530 05/09/13 0539  05/10/13 0458  WBC 23.9* 24.9* 17.8* 16.2* 16.7*  NEUTROABS  21.0*  --  15.1*  --   --   HGB 13.0 11.9* 12.3 11.5* 11.3*  HCT 38.2 35.5* 36.5 33.5* 33.7*  MCV 87.0 87.7 86.9 86.8 86.4  PLT 159 159 152 160 179   Cardiac Enzymes: No results found for this basename: CKTOTAL, CKMB, CKMBINDEX, TROPONINI,  in the last 168 hours BNP: BNP (last 3 results) No results found for this basename: PROBNP,  in the last 8760 hours CBG: No results found for this basename: GLUCAP,  in the last 168 hours  Signed:  Lema Heinkel K  [facility] 05/10/2013, 11:23 AM

## 2013-05-10 NOTE — Progress Notes (Signed)
ANTICOAGULATION CONSULT NOTE - Follow Up  Pharmacy Consult for warfarin Indication: atrial fibrillation  Allergies  Allergen Reactions  . Lipitor [Atorvastatin Calcium]     myalgia    Patient Measurements: Height: 5\' 9"  (175.3 cm) Weight: 170 lb 13.7 oz (77.5 kg) IBW/kg (Calculated) : 66.2   Vital Signs: Temp: 97.9 F (36.6 C) (11/17 0510) Temp src: Oral (11/17 0510) BP: 133/83 mmHg (11/17 0510) Pulse Rate: 93 (11/17 0510)  Labs:  Recent Labs  05/08/13 0530 05/09/13 0539 05/10/13 0458  HGB 12.3 11.5* 11.3*  HCT 36.5 33.5* 33.7*  PLT 152 160 179  LABPROT 18.0* 20.1* 22.2*  INR 1.53* 1.77* 2.02*  CREATININE 1.32*  --   --     Estimated Creatinine Clearance: 39.7 ml/min (by C-G formula based on Cr of 1.32).   Medical History: Past Medical History  Diagnosis Date  . NICM (nonischemic cardiomyopathy)     a. neg CLite in 2003;  b. EF 40-45% in past;   c.  Echo 12/11: EF 35-40%, mild MR, mild LAE, mild RAE, small pericardial effusion   . Mild mitral regurgitation   . HLD (hyperlipidemia)   . Chronic systolic heart failure   . Atrial fibrillation     a. coumadin;  b. Amiodarone  . Stasis ulcer   . Absence of menstruation   . Family history of ischemic heart disease   . Varicose veins   . Depression   . Osteoarthritis   . Chest pain 03/2012    a. Lex MV 10/13:  EF 64%, dist ant and apical defect sugg of soft tissue atten, no ischemia  . Cataract   . GERD (gastroesophageal reflux disease)     Medications:  Scheduled:  . amiodarone  200 mg Oral Daily  . cefpodoxime  200 mg Oral Q12H  . FLUoxetine  10 mg Oral Daily  . pantoprazole  40 mg Oral Daily  . Warfarin - Pharmacist Dosing Inpatient   Does not apply q1800   Infusions:  . sodium chloride 75 mL/hr at 05/10/13 0504    Assessment: 72 yo female presented to ER with UTI, treated with Rocephin. Pharmacy consulted to continue patient's warfarin for atrial fibrillation. Usual regimen reported as 2.5mg   daily except 5mg  on TuesThursSat,  followed by [facility].   Inpatient warfarin doses administered 11/14-11/6: 2.5mg , 5mg , 5mg .    INR therapeutic today.  H/H and pltc relatively stable.  No bleeding reported in chart notes. Drug interactions: amiodarone (but already on PTA so would not expect acute issues).   Switch from ciprofloxacin to cefpodoxime for UTI noted - cephalosporin carries lower risk of drug interaction with warfarin than does quinolone.  Goal of Therapy:  INR 2-3   Plan:  1) Warfarin 2.5 mg PO x 1 today as per patient's usual regimen. 2) Daily INR  Elie Goody, PharmD, BCPS Pager: [phone] 05/10/2013  10:55 AM

## 2013-05-11 NOTE — ED Provider Notes (Signed)
I saw and evaluated the patient, reviewed the resident's note and I agree with the findings and plan.  EKG Interpretation    Date/Time:    Ventricular Rate:  83 PR Interval:  162 QRS Duration: 90 QT Interval:  400 QTC Calculation: 470 R Axis:   -28 Text Interpretation:  Sinus rhythm Probable left atrial enlargement Borderline left axis deviation Low voltage, precordial leads Baseline wander in lead(s) V5           Patient with a UTI. History of same. Likely pyelonephritis. Will admit to the hospital. Has been on different antibiotics and may have a resistant organism  Juliet Rude. Rubin Payor, MD 05/11/13 [phone]

## 2013-05-12 ENCOUNTER — Encounter: Payer: Self-pay | Admitting: Internal Medicine

## 2013-05-12 LAB — CULTURE, BLOOD (ROUTINE X 2): Culture: NO GROWTH

## 2013-05-12 NOTE — Assessment & Plan Note (Signed)
Warfarin and pacerone continus

## 2013-05-12 NOTE — Assessment & Plan Note (Signed)
Stable, will continue prilosec

## 2013-05-12 NOTE — Assessment & Plan Note (Signed)
On no meds;last LDL was 79

## 2013-05-12 NOTE — Assessment & Plan Note (Signed)
continie aricept

## 2013-05-12 NOTE — Assessment & Plan Note (Signed)
Continue with pacerone and coumadin

## 2013-05-12 NOTE — Assessment & Plan Note (Signed)
WITH SEPSIS- initially treated with rocephin; C and S showed E coli pan sensitive and changed to cipro which caused nausea so switched to vantin 200 BID  On 11/17 from what I can tell in notes so should run through 11/24 7 days

## 2013-05-12 NOTE — Assessment & Plan Note (Signed)
continie Prozac

## 2013-05-12 NOTE — Assessment & Plan Note (Signed)
See above under UTI

## 2013-05-12 NOTE — Progress Notes (Signed)
MRN: [id number] Name: Virginia Young  Sex: female Age: 72 y.o. DOB: [birth year]  PSC #: [facility] Facility/Room: 212 Level Of Care: SNF Provider: Merrilee Seashore D Emergency Contacts: Extended Emergency Contact Information Primary Emergency Contact: Albin Fischer, [place] Work Phone: [phone] Mobile Phone: [phone] Relation: Daughter  Code Status: FULL  Allergies: Lipitor  Chief Complaint  Patient presents with  . nursing home admission    HPI: Patient is 72 y.o. female who is admitted for supportive care after being hosp for urosepsis.  Past Medical History  Diagnosis Date  . NICM (nonischemic cardiomyopathy)     a. neg CLite in 2003;  b. EF 40-45% in past;   c.  Echo 12/11: EF 35-40%, mild MR, mild LAE, mild RAE, small pericardial effusion   . Mild mitral regurgitation   . HLD (hyperlipidemia)   . Chronic systolic heart failure   . Atrial fibrillation     a. coumadin;  b. Amiodarone  . Stasis ulcer   . Absence of menstruation   . Family history of ischemic heart disease   . Varicose veins   . Depression   . Osteoarthritis   . Chest pain 03/2012    a. Lex MV 10/13:  EF 64%, dist ant and apical defect sugg of soft tissue atten, no ischemia  . Cataract   . GERD (gastroesophageal reflux disease)     Past Surgical History  Procedure Laterality Date  . Lipoma excision      h/o removed from upper back x 2  . Inguinal hernia repair      right      Medication List    Notice   This visit is during an admission. Changes to the med list made in this visit will be reflected in the After Visit Summary of the admission.      No orders of the defined types were placed in this encounter.    Immunization History  Administered Date(s) Administered  . Influenza Split 04/03/2012  . Influenza Whole 04/30/2006, 04/17/2010, 04/25/2011  . Influenza,inj,Quad PF,36+ Mos 03/23/2013  . Pneumococcal Polysaccharide  02/02/2009  . Td 04/26/2010  . Zoster 04/26/2010    History  Substance Use Topics  . Smoking status: Never Smoker   . Smokeless tobacco: Never Used  . Alcohol Use: No    Family history is noncontributory    Review of Systems  DATA OBTAINED: from patient GENERAL: Feels well no fevers, fatigue, appetite changes SKIN: No itching, rash or wounds EYES: No eye pain, redness, discharge EARS: No earache, tinnitus, change in hearing NOSE: No congestion, drainage or bleeding  MOUTH/THROAT: No mouth or tooth pain, No sore throat, No difficulty chewing or swallowing  RESPIRATORY: No cough, wheezing, SOB CARDIAC: No chest pain, palpitations, lower extremity edema  GI: No abdominal pain, No N/V/D or constipation, No heartburn or reflux  GU: No dysuria, frequency or urgency, or incontinence  MUSCULOSKELETAL: No unrelieved bone/joint pain NEUROLOGIC: No headache, dizziness or focal weakness PSYCHIATRIC: No overt anxiety or sadness. Sleeps well. No behavior issue.   Filed Vitals:   05/12/13 0909  BP: 131/85  Pulse: 92  Temp: 96.7 F (35.9 C)  Resp: 18    Physical Exam  GENERAL APPEARANCE: Alert, conversant. Appropriately groomed. No acute distress.  SKIN: No diaphoresis rash, or wounds HEAD: Normocephalic, atraumatic  EYES: Conjunctiva/lids clear. Pupils round, reactive. EOMs intact.  EARS: External exam WNL, canals clear. Hearing grossly normal.  NOSE: No deformity or discharge.  MOUTH/THROAT: Lips w/o lesions.  RESPIRATORY: Breathing is even, unlabored. Lung sounds are clear   CARDIOVASCULAR: Heart RRR no murmurs, rubs or gallops. No peripheral edema.  VENOUS: No varicosities. + venous stasis skin changes  GASTROINTESTINAL: Abdomen is soft, non-tender, not distended w/ normal bowel sounds GENITOURINARY: Bladder non tender, not distended  MUSCULOSKELETAL: No abnormal joints or musculature NEUROLOGIC: Oriented X3. Cranial nerves 2-12 grossly intact. Moves all extremities no  tremor. PSYCHIATRIC: Mood and affect appropriate to situation, no behavioral issues  Patient Active Problem List   Diagnosis Date Noted  . Sepsis 05/06/2013  . UTI (lower urinary tract infection) 05/06/2013  . Neck mass 02/19/2013  . Neck pain, bilateral 02/19/2013  . Stye 02/19/2013  . Right groin mass 11/08/2011  . LBP (low back pain) 11/08/2011  . Lipoma of neck 06/21/2011  . Memory loss 06/21/2011  . OVERACTIVE BLADDER 08/21/2010  . Long term current use of anticoagulant 07/25/2010  . VAGINITIS 01/18/2010  . OSTEOARTHRITIS 01/18/2010  . KNEE PAIN 01/18/2010  . FOOT PAIN 01/18/2010  . PURE HYPERCHOLESTEROLEMIA 04/20/2009  . GERD 04/17/2009  . DYSLIPIDEMIA 08/30/2008  . MITRAL REGURGITATION, MILD 08/30/2008  . CARDIOMYOPATHY 08/30/2008  . CONGESTIVE HEART FAILURE 04/13/2007  . DEPRESSION 04/10/2007  . DISEASE, MITRAL VALVE NEC/NOS 04/10/2007  . Atrial fibrillation 04/10/2007  . STASIS ULCER 04/10/2007  . VENOUS INSUFFICIENCY, LEGS 04/10/2007  . ABSENCE, MENSTRUATION 04/10/2007    CBC    Component Value Date/Time   WBC 16.7* 05/10/2013 0458   RBC 3.90 05/10/2013 0458   HGB 11.3* 05/10/2013 0458   HCT 33.7* 05/10/2013 0458   PLT 179 05/10/2013 0458   MCV 86.4 05/10/2013 0458   LYMPHSABS 1.0 05/08/2013 0530   MONOABS 1.6* 05/08/2013 0530   EOSABS 0.1 05/08/2013 0530   BASOSABS 0.0 05/08/2013 0530    CMP     Component Value Date/Time   NA 133* 05/08/2013 0530   K 4.2 05/08/2013 0530   CL 99 05/08/2013 0530   CO2 29 05/08/2013 0530   GLUCOSE 124* 05/08/2013 0530   BUN 18 05/08/2013 0530   CREATININE 1.32* 05/08/2013 0530   CALCIUM 8.5 05/08/2013 0530   PROT 5.7* 05/08/2013 0530   ALBUMIN 2.2* 05/08/2013 0530   AST 19 05/08/2013 0530   ALT 20 05/08/2013 0530   ALKPHOS 95 05/08/2013 0530   BILITOT 0.5 05/08/2013 0530   GFRNONAA 39* 05/08/2013 0530   GFRAA 45* 05/08/2013 0530    Assessment and Plan  UTI (lower urinary tract infection) WITH SEPSIS-  initially treated with rocephin; C and S showed E coli pan sensitive and changed to cipro which caused nausea so switched to vantin 200 BID  On 11/17 from what I can tell in notes so should run through 11/24 7 days  Atrial fibrillation Continue with pacerone and coumadin  CARDIOMYOPATHY Warfarin and pacerone continus  GERD Stable, will continue prilosec  PURE HYPERCHOLESTEROLEMIA On no meds;last LDL was 79  Sepsis See above under UTI  DEPRESSION continie Prozac  Memory loss continie aricept    Margit Hanks, MD

## 2013-05-13 LAB — CULTURE, BLOOD (ROUTINE X 2): Culture: NO GROWTH

## 2013-05-31 ENCOUNTER — Non-Acute Institutional Stay (SKILLED_NURSING_FACILITY): Payer: Medicare Other | Admitting: Internal Medicine

## 2013-05-31 ENCOUNTER — Encounter: Payer: Self-pay | Admitting: Internal Medicine

## 2013-05-31 DIAGNOSIS — K219 Gastro-esophageal reflux disease without esophagitis: Secondary | ICD-10-CM | POA: Diagnosis not present

## 2013-05-31 DIAGNOSIS — E7800 Pure hypercholesterolemia, unspecified: Secondary | ICD-10-CM

## 2013-05-31 DIAGNOSIS — I4891 Unspecified atrial fibrillation: Secondary | ICD-10-CM

## 2013-05-31 DIAGNOSIS — F3289 Other specified depressive episodes: Secondary | ICD-10-CM

## 2013-05-31 DIAGNOSIS — R413 Other amnesia: Secondary | ICD-10-CM

## 2013-05-31 DIAGNOSIS — F329 Major depressive disorder, single episode, unspecified: Secondary | ICD-10-CM

## 2013-05-31 DIAGNOSIS — I428 Other cardiomyopathies: Secondary | ICD-10-CM

## 2013-05-31 NOTE — Progress Notes (Signed)
MRN: [id number] Name: Virginia Young  Sex: female Age: 72 y.o. DOB: May 17, 1941  PSC #: [facility] Facility/Room: 212 Level Of Care: SNF Provider: Merrilee Seashore D Emergency Contacts: Extended Emergency Contact Information Primary Emergency Contact: Albin Fischer, [place] Work Phone: [phone] Mobile Phone: [phone] Relation: Daughter  Code Status: FULL  Allergies: Lipitor  Chief Complaint  Patient presents with  . Discharge Note    HPI: Patient is 72 y.o. female who was admitted after urosepsis and who is now improved and going home.  Past Medical History  Diagnosis Date  . NICM (nonischemic cardiomyopathy)     a. neg CLite in 2003;  b. EF 40-45% in past;   c.  Echo 12/11: EF 35-40%, mild MR, mild LAE, mild RAE, small pericardial effusion   . Mild mitral regurgitation   . HLD (hyperlipidemia)   . Chronic systolic heart failure   . Atrial fibrillation     a. coumadin;  b. Amiodarone  . Stasis ulcer   . Absence of menstruation   . Family history of ischemic heart disease   . Varicose veins   . Depression   . Osteoarthritis   . Chest pain 03/2012    a. Lex MV 10/13:  EF 64%, dist ant and apical defect sugg of soft tissue atten, no ischemia  . Cataract   . GERD (gastroesophageal reflux disease)     Past Surgical History  Procedure Laterality Date  . Lipoma excision      h/o removed from upper back x 2  . Inguinal hernia repair      right      Medication List       This list is accurate as of: 05/31/13  4:42 PM.  Always use your most recent med list.               amiodarone 200 MG tablet  Commonly known as:  PACERONE  take 1 tablet by mouth once daily     donepezil 5 MG tablet  Commonly known as:  ARICEPT  take 1 tablet by mouth at bedtime     FLUoxetine 10 MG tablet  Commonly known as:  PROZAC  Take 1 tablet (10 mg total) by mouth daily.     omeprazole 40 MG capsule  Commonly known as:   PRILOSEC  take 1 capsule by mouth once daily     Vitamin D3 2000 UNITS Tabs  Take 1 tablet by mouth daily.     warfarin 5 MG tablet  Commonly known as:  COUMADIN  Take 2.5-5 mg by mouth daily. Takes 5mg  Tuesday, Thursday and  Takes 2.5mg  all other days.        No orders of the defined types were placed in this encounter.    Immunization History  Administered Date(s) Administered  . Influenza Split 04/03/2012  . Influenza Whole 04/30/2006, 04/17/2010, 04/25/2011  . Influenza,inj,Quad PF,36+ Mos 03/23/2013  . Pneumococcal Polysaccharide-23 02/02/2009  . Pneumococcal-Unspecified 05/01/2011  . Td 04/26/2010  . Zoster 04/26/2010    History  Substance Use Topics  . Smoking status: Never Smoker   . Smokeless tobacco: Never Used  . Alcohol Use: No    Filed Vitals:   05/31/13 1635  BP: 118/59  Pulse: 63  Temp: 97.8 F (36.6 C)  Resp: 16    Physical Exam  GENERAL APPEARANCE: Alert, conversant. Appropriately groomed. No acute distress.  HEENT: Unremarkable. RESPIRATORY: Breathing is even, unlabored.  Lung sounds are clear   CARDIOVASCULAR: Heart RRR no murmurs, rubs or gallops. No peripheral edema.  GASTROINTESTINAL: Abdomen is soft, non-tender, not distended w/ normal bowel sounds.  NEUROLOGIC: Cranial nerves 2-12 grossly intact. Moves all extremities no tremor.  Patient Active Problem List   Diagnosis Date Noted  . Sepsis 05/06/2013  . UTI (lower urinary tract infection) 05/06/2013  . Neck mass 02/19/2013  . Neck pain, bilateral 02/19/2013  . Stye 02/19/2013  . Right groin mass 11/08/2011  . LBP (low back pain) 11/08/2011  . Lipoma of neck 06/21/2011  . Memory loss 06/21/2011  . OVERACTIVE BLADDER 08/21/2010  . Long term current use of anticoagulant 07/25/2010  . VAGINITIS 01/18/2010  . OSTEOARTHRITIS 01/18/2010  . KNEE PAIN 01/18/2010  . FOOT PAIN 01/18/2010  . PURE HYPERCHOLESTEROLEMIA 04/20/2009  . GERD 04/17/2009  . DYSLIPIDEMIA 08/30/2008  . MITRAL  REGURGITATION, MILD 08/30/2008  . CARDIOMYOPATHY 08/30/2008  . CONGESTIVE HEART FAILURE 04/13/2007  . DEPRESSION 04/10/2007  . DISEASE, MITRAL VALVE NEC/NOS 04/10/2007  . Atrial fibrillation 04/10/2007  . STASIS ULCER 04/10/2007  . VENOUS INSUFFICIENCY, LEGS 04/10/2007  . ABSENCE, MENSTRUATION 04/10/2007    CBC    Component Value Date/Time   WBC 16.7* 05/10/2013 0458   RBC 3.90 05/10/2013 0458   HGB 11.3* 05/10/2013 0458   HCT 33.7* 05/10/2013 0458   PLT 179 05/10/2013 0458   MCV 86.4 05/10/2013 0458   LYMPHSABS 1.0 05/08/2013 0530   MONOABS 1.6* 05/08/2013 0530   EOSABS 0.1 05/08/2013 0530   BASOSABS 0.0 05/08/2013 0530    CMP     Component Value Date/Time   NA 133* 05/08/2013 0530   K 4.2 05/08/2013 0530   CL 99 05/08/2013 0530   CO2 29 05/08/2013 0530   GLUCOSE 124* 05/08/2013 0530   BUN 18 05/08/2013 0530   CREATININE 1.32* 05/08/2013 0530   CALCIUM 8.5 05/08/2013 0530   PROT 5.7* 05/08/2013 0530   ALBUMIN 2.2* 05/08/2013 0530   AST 19 05/08/2013 0530   ALT 20 05/08/2013 0530   ALKPHOS 95 05/08/2013 0530   BILITOT 0.5 05/08/2013 0530   GFRNONAA 39* 05/08/2013 0530   GFRAA 45* 05/08/2013 0530    Assessment and Plan  Patient is being d/c home in stable and improved condition with home health care, OT/PT. She will need a PT/INR drawn on 12/15.  Margit Hanks, MD

## 2013-06-10 ENCOUNTER — Encounter: Payer: Self-pay | Admitting: Internal Medicine

## 2013-06-10 ENCOUNTER — Ambulatory Visit (INDEPENDENT_AMBULATORY_CARE_PROVIDER_SITE_OTHER): Payer: Medicare Other | Admitting: Internal Medicine

## 2013-06-10 ENCOUNTER — Ambulatory Visit (INDEPENDENT_AMBULATORY_CARE_PROVIDER_SITE_OTHER): Payer: Medicare Other

## 2013-06-10 VITALS — BP 130/80 | HR 84 | Temp 96.8°F | Resp 16 | Wt 173.0 lb

## 2013-06-10 DIAGNOSIS — R202 Paresthesia of skin: Secondary | ICD-10-CM

## 2013-06-10 DIAGNOSIS — I951 Orthostatic hypotension: Secondary | ICD-10-CM

## 2013-06-10 DIAGNOSIS — R413 Other amnesia: Secondary | ICD-10-CM

## 2013-06-10 DIAGNOSIS — R209 Unspecified disturbances of skin sensation: Secondary | ICD-10-CM

## 2013-06-10 DIAGNOSIS — Z7901 Long term (current) use of anticoagulants: Secondary | ICD-10-CM | POA: Diagnosis not present

## 2013-06-10 DIAGNOSIS — I509 Heart failure, unspecified: Secondary | ICD-10-CM

## 2013-06-10 DIAGNOSIS — E559 Vitamin D deficiency, unspecified: Secondary | ICD-10-CM | POA: Diagnosis not present

## 2013-06-10 DIAGNOSIS — R110 Nausea: Secondary | ICD-10-CM

## 2013-06-10 DIAGNOSIS — I4891 Unspecified atrial fibrillation: Secondary | ICD-10-CM

## 2013-06-10 LAB — URINALYSIS, ROUTINE W REFLEX MICROSCOPIC
Bilirubin Urine: NEGATIVE
Ketones, ur: NEGATIVE
Nitrite: POSITIVE — AB
Specific Gravity, Urine: 1.01 (ref 1.000–1.030)
Total Protein, Urine: NEGATIVE
Urine Glucose: NEGATIVE
Urobilinogen, UA: 0.2 (ref 0.0–1.0)
pH: 7 (ref 5.0–8.0)

## 2013-06-10 LAB — HEPATIC FUNCTION PANEL
ALT: 16 U/L (ref 0–35)
AST: 19 U/L (ref 0–37)
Albumin: 3.6 g/dL (ref 3.5–5.2)
Alkaline Phosphatase: 121 U/L — ABNORMAL HIGH (ref 39–117)
Bilirubin, Direct: 0 mg/dL (ref 0.0–0.3)
Total Bilirubin: 0.6 mg/dL (ref 0.3–1.2)
Total Protein: 7.1 g/dL (ref 6.0–8.3)

## 2013-06-10 LAB — BASIC METABOLIC PANEL
BUN: 15 mg/dL (ref 6–23)
CO2: 29 mEq/L (ref 19–32)
Calcium: 8.9 mg/dL (ref 8.4–10.5)
Chloride: 104 mEq/L (ref 96–112)
Creatinine, Ser: 1.2 mg/dL (ref 0.4–1.2)
GFR: 48.61 mL/min — ABNORMAL LOW (ref >60.00)
Glucose, Bld: 93 mg/dL (ref 70–99)
Potassium: 4.3 mEq/L (ref 3.5–5.1)
Sodium: 138 mEq/L (ref 135–145)

## 2013-06-10 LAB — CBC WITH DIFFERENTIAL/PLATELET
Basophils Absolute: 0.1 10*3/uL (ref 0.0–0.1)
Basophils Relative: 0.9 % (ref 0.0–3.0)
Eosinophils Absolute: 0.1 10*3/uL (ref 0.0–0.7)
Eosinophils Relative: 1 % (ref 0.0–5.0)
HCT: 39.3 % (ref 36.0–46.0)
Hemoglobin: 13.1 g/dL (ref 12.0–15.0)
Lymphocytes Relative: 21 % (ref 12.0–46.0)
Lymphs Abs: 2 10*3/uL (ref 0.7–4.0)
MCHC: 33.5 g/dL (ref 30.0–36.0)
MCV: 85.1 fl (ref 78.0–100.0)
Monocytes Absolute: 0.7 10*3/uL (ref 0.1–1.0)
Monocytes Relative: 7.3 % (ref 3.0–12.0)
Neutro Abs: 6.8 10*3/uL (ref 1.4–7.7)
Neutrophils Relative %: 69.8 % (ref 43.0–77.0)
Platelets: 323 10*3/uL (ref 150.0–400.0)
RBC: 4.61 Mil/uL (ref 3.87–5.11)
RDW: 14.2 % (ref 11.5–14.6)
WBC: 9.8 10*3/uL (ref 4.5–10.5)

## 2013-06-10 LAB — H. PYLORI ANTIBODY, IGG: H Pylori IgG: POSITIVE — AB

## 2013-06-10 LAB — CORTISOL: Cortisol, Plasma: 12.2 ug/dL

## 2013-06-10 LAB — SEDIMENTATION RATE: Sed Rate: 40 mm/hr — ABNORMAL HIGH (ref 0–22)

## 2013-06-10 LAB — VITAMIN B12: Vitamin B-12: 747 pg/mL (ref 211–911)

## 2013-06-10 LAB — LIPASE: Lipase: 29 U/L (ref 11.0–59.0)

## 2013-06-10 MED ORDER — ONDANSETRON HCL 4 MG PO TABS: 4.0000 mg | ORAL_TABLET | Freq: Three times a day (TID) | ORAL | Status: DC | PRN

## 2013-06-10 NOTE — Assessment & Plan Note (Signed)
nausea?due to Aricept vs other -- d/c Aricept Labs

## 2013-06-10 NOTE — Assessment & Plan Note (Signed)
Continue with current prescription therapy as reflected on the Med list.  

## 2013-06-10 NOTE — Assessment & Plan Note (Signed)
2012 ?due to Aricept vs other

## 2013-06-10 NOTE — Progress Notes (Signed)
Pre visit review using our clinic review tool, if applicable. No additional management support is needed unless otherwise documented below in the visit note. 

## 2013-06-10 NOTE — Patient Instructions (Signed)
  Stop Aricept  

## 2013-06-10 NOTE — Progress Notes (Signed)
Subjective:     HPI  C/o nausea, weakness x 2 -3 months; fell several times - no syncope. Not better  F/u Hosp and a NH stay x 1 month.  Admit date: 05/06/2013 Discharge date: 05/10/2013   Time spent: 35 minutes   Recommendations for Outpatient Follow-up:   Follow up with PCP in 1-2 weeks   Discharge Diagnoses:  Principal Problem:   Sepsis Active Problems:   CARDIOMYOPATHY   Atrial fibrillation   CONGESTIVE HEART FAILURE   UTI (lower urinary tract infection)     Discharge Condition: Improved   Diet recommendation: Cardiac    Filed Weights     05/07/13 0700   Weight:  77.5 kg (170 lb 13.7 oz)        History of present illness:   Virginia Young is a 72 y.o. female  With a hx of multiple prior UTI's treated as an outpatient, afib on coumadin, and hx of cardiomyopathy who presents to the ED with worsening suprapubic pain. Pt was recently treated as an outpatient with bactrim for UTI. In the ED, the pt was noted to have a WBC of over 23K, temp of over 101F, and a UA suggestive of UTI. The patient was given a dose of rocephin and the hospitalists were consulted for admission   Hospital Course:  Sepsis with UTI   Urine cx with >100,000 ecoli, pan-sensitive but resistant to bactrim Improved with rocephin Blood cx neg WBC trending down nicely with antibiotics Initially transitioned to PO cipro based on sensitivities, however, pt reported nausea so started on Vantin 200mg  BID Afib   Cont on coumadin No signs of bleed Currently rate controlled Cardiomyopathy   Euvolemic to slightly hypovolemic Gentle IVF as tolerated DVT prophylaxis   Cont on coumadin   Discharge Exam: Filed Vitals:     05/09/13 0548  05/09/13 1439  05/09/13 2215  05/10/13 0510   BP:  129/77  152/74  152/97  133/83   Pulse:  89  82  90  93   Temp:  98.3 F (36.8 C)  97.7 F (36.5 C)  98.4 F (36.9 C)  97.9 F (36.6 C)   TempSrc:  Oral  Oral  Oral  Oral   Resp:  18  20  20   18    Height:           Weight:           SpO2:  93%  95%  95%  95%        General: Awake, in nad Cardiovascular: regular, s1, s2 Respiratory: normal resp effort, no wheezing   Discharge Instructions            Future Appointments  Provider  Department  Dept Phone     05/17/2013 11:15 AM  [facility]  [facility]  [phone]     06/29/2013 1:30 PM  Tresa Garter, MD  [facility]  [phone]             Medication List              amiodarone 200 MG tablet   Commonly known as:  PACERONE   take 1 tablet by mouth once daily         cefpodoxime 200 MG tablet   Commonly known as:  VANTIN   Take 1 tablet (200 mg total) by mouth every 12 (twelve) hours.         donepezil  5 MG tablet   Commonly known as:  ARICEPT   take 1 tablet by mouth at bedtime         FLUoxetine 10 MG tablet   Commonly known as:  PROZAC   Take 1 tablet (10 mg total) by mouth daily.         omeprazole 40 MG capsule   Commonly known as:  PRILOSEC   take 1 capsule by mouth once daily         Vitamin D3 2000 UNITS Tabs   Take 1 tablet by mouth daily.         warfarin 5 MG tablet   Commonly known as:  COUMADIN   Take 2.5-5 mg by mouth daily. Takes 5mg  Tuesday, Thursday, and Saturday. Takes 2.5mg  all other days.              Allergies   Allergen  Reactions   .  Lipitor [Atorvastatin Calcium]         myalgia           --------------------------------------------------------------------------------   The results of significant diagnostics from this hospitalization (including imaging, microbiology, ancillary and laboratory) are listed below for reference.       Significant Diagnostic Studies: No results found.   Microbiology: Recent Results (from the past 240 hour(s))   URINE CULTURE     Status: None     Collection Time      05/06/13  2:42 PM       Result  Value  Range  Status     Specimen Description   URINE, CATHETERIZED     Final     Special Requests  NONE     Final     Culture  Setup Time        Final     Value:  05/07/2013 01:28        Performed at [facility]     Culture        Final     Value:  >=100,000 COLONIES/mL ESCHERICHIA COLI        Performed at [facility]     Report Status  05/08/2013 FINAL     Final     Organism ID, Bacteria  ESCHERICHIA COLI     Final   CULTURE, BLOOD (ROUTINE X 2)     Status: None     Collection Time      05/06/13  4:32 PM       Result  Value  Range  Status     Specimen Description  BLOOD R HAND     Final     Special Requests  NONE BOTTLES DRAWN AEROBIC AND ANAEROBIC 4CC     Final     Culture  Setup Time        Final     Value:  05/07/2013 00:13        Performed at [facility]     Culture        Final     Value:         BLOOD CULTURE RECEIVED NO GROWTH TO DATE CULTURE WILL BE HELD FOR 5 DAYS BEFORE ISSUING A FINAL NEGATIVE REPORT        Performed at [facility]     Report Status  PENDING     Incomplete   CULTURE, BLOOD (ROUTINE X 2)     Status: None     Collection Time      05/06/13  4:37  PM       Result  Value  Range  Status     Specimen Description  BLOOD RIGHT ANTECUBITAL     Final     Special Requests  NONE BOTTLES DRAWN AEROBIC AND ANAEROBIC [facility]     Final     Culture  Setup Time        Final     Value:  05/06/2013 23:26        Performed at [facility]     Culture        Final     Value:         BLOOD CULTURE RECEIVED NO GROWTH TO DATE CULTURE WILL BE HELD FOR 5 DAYS BEFORE ISSUING A FINAL NEGATIVE REPORT        Performed at [facility]     Report Status  PENDING     Incomplete        Labs: Basic Metabolic Panel:   Recent Labs Lab  05/06/13 1424  05/07/13 0455  05/08/13 0530   NA  131*  134*  133*   K  3.7  4.1  4.2   CL  94*  99  99   CO2  27  29  29    GLUCOSE  206*  160*  124*   BUN  17  20  18    CREATININE  1.40*  1.49*  1.32*   CALCIUM  9.0  8.9  8.5      Liver  Function Tests:   Recent Labs Lab  05/06/13 1424  05/07/13 0455  05/08/13 0530   AST  26  26  19    ALT  17  18  20    ALKPHOS  97  94  95   BILITOT  0.9  0.5  0.5   PROT  5.9*  5.4*  5.7*   ALBUMIN  2.7*  2.2*  2.2*      No results found for this basename: LIPASE, AMYLASE,  in the last 168 hours No results found for this basename: AMMONIA,  in the last 168 hours CBC:   Recent Labs Lab  05/06/13 1424  05/07/13 0455  05/08/13 0530  05/09/13 0539  05/10/13 0458   WBC  23.9*  24.9*  17.8*  16.2*  16.7*   NEUTROABS  21.0*   --   15.1*   --    --    HGB  13.0  11.9*  12.3  11.5*  11.3*   HCT  38.2  35.5*  36.5  33.5*  33.7*   MCV  87.0  87.7  86.9  86.8  86.4   PLT  159  159  152  160  179      Cardiac Enzymes: No results found for this basename: CKTOTAL, CKMB, CKMBINDEX, TROPONINI,  in the last 168 hours BNP: BNP (last 3 results) No results found for this basename: PROBNP,  in the last 8760 hours CBG: No results found for this basename: GLUCAP,  in the last 168 hours   Signed:   CHIU, STEPHEN K      [facility] 05/10/2013, 11:23 AM          The patient presents for a follow-up of  chronic hypertension, A fib and chronic dyslipidemia, controlled with medicines.  F/u stiffness in LS spine and arthralgias - much better off Lipitor F/u  memory issues; got lost a few times xmonths - no change   BP Readings from Last 3 Encounters:  06/10/13 132/86  05/31/13 118/59  05/10/13 133/83   Wt Readings from Last 3 Encounters:  06/10/13 173 lb (78.472 kg)  05/07/13 170 lb 13.7 oz (77.5 kg)  03/23/13 177 lb 6.4 oz (80.468 kg)      Review of Systems  Constitutional: Negative for activity change, appetite change, fatigue and unexpected weight change.  HENT: Negative for congestion, mouth sores and sinus pressure.   Eyes: Negative for visual disturbance.  Respiratory: Negative for cough and chest tightness.   Genitourinary: Negative for difficulty urinating  and vaginal pain.  Musculoskeletal: Negative for gait problem.  Skin: Negative for pallor.  Neurological: Negative for dizziness, tremors, weakness and numbness.  Psychiatric/Behavioral: Positive for behavioral problems and decreased concentration. Negative for suicidal ideas, confusion and sleep disturbance. The patient is nervous/anxious.        Objective:   Physical Exam  Constitutional: She is oriented to person, place, and time. She appears well-developed and well-nourished. No distress.  HENT:  Head: Normocephalic.  Right Ear: External ear normal.  Left Ear: External ear normal.  Nose: Nose normal.  Mouth/Throat: Oropharynx is clear and moist.  Eyes: Conjunctivae are normal. Pupils are equal, round, and reactive to light. Right eye exhibits no discharge. Left eye exhibits no discharge.  Neck: Normal range of motion. Neck supple. No JVD present. No tracheal deviation present. No thyromegaly present.  Cardiovascular: Normal rate and normal heart sounds.   Pulmonary/Chest: No stridor. No respiratory distress. She has no wheezes.  Abdominal: Soft. Bowel sounds are normal. She exhibits no distension and no mass. There is no tenderness. There is no rebound and no guarding.  R groin w/less fullness, not  tender  Musculoskeletal: She exhibits edema (trace B) and tenderness (LS spine L>>R).  Lymphadenopathy:    She has no cervical adenopathy.  Neurological: She is alert and oriented to person, place, and time. She displays normal reflexes. No cranial nerve deficit. She exhibits normal muscle tone. Coordination (mild) abnormal.  Skin: No rash noted. No erythema.  A lot of veins on LEs  Psychiatric: She has a normal mood and affect. Her behavior is normal. Judgment and thought content normal.  8/14 L eye lower eyelid  3x2 cm oval lump at the L post neck base  Lab Results  Component Value Date   WBC 16.7* 05/10/2013   HGB 11.3* 05/10/2013   HCT 33.7* 05/10/2013   PLT 179 05/10/2013    GLUCOSE 124* 05/08/2013   CHOL 160 03/22/2013   TRIG 89.0 03/22/2013   HDL 63.10 03/22/2013   LDLCALC 79 03/22/2013   ALT 20 05/08/2013   AST 19 05/08/2013   NA 133* 05/08/2013   K 4.2 05/08/2013   CL 99 05/08/2013   CREATININE 1.32* 05/08/2013   BUN 18 05/08/2013   CO2 29 05/08/2013   TSH 0.92 03/22/2013   INR 2.02* 05/10/2013           Assessment & Plan:

## 2013-06-10 NOTE — Assessment & Plan Note (Signed)
12/14 Labs Stop aricept

## 2013-06-11 LAB — VITAMIN D 25 HYDROXY (VIT D DEFICIENCY, FRACTURES): Vit D, 25-Hydroxy: 49 ng/mL (ref 30–89)

## 2013-06-21 ENCOUNTER — Encounter: Payer: Self-pay | Admitting: Internal Medicine

## 2013-06-21 ENCOUNTER — Ambulatory Visit (INDEPENDENT_AMBULATORY_CARE_PROVIDER_SITE_OTHER): Payer: Medicare Other | Admitting: Internal Medicine

## 2013-06-21 VITALS — BP 104/62 | HR 68 | Temp 96.8°F | Resp 16 | Wt 170.0 lb

## 2013-06-21 DIAGNOSIS — F329 Major depressive disorder, single episode, unspecified: Secondary | ICD-10-CM

## 2013-06-21 DIAGNOSIS — I4891 Unspecified atrial fibrillation: Secondary | ICD-10-CM

## 2013-06-21 DIAGNOSIS — I872 Venous insufficiency (chronic) (peripheral): Secondary | ICD-10-CM

## 2013-06-21 DIAGNOSIS — N950 Postmenopausal bleeding: Secondary | ICD-10-CM | POA: Diagnosis not present

## 2013-06-21 DIAGNOSIS — N3941 Urge incontinence: Secondary | ICD-10-CM | POA: Diagnosis not present

## 2013-06-21 DIAGNOSIS — N390 Urinary tract infection, site not specified: Secondary | ICD-10-CM | POA: Diagnosis not present

## 2013-06-21 DIAGNOSIS — K219 Gastro-esophageal reflux disease without esophagitis: Secondary | ICD-10-CM

## 2013-06-21 DIAGNOSIS — R110 Nausea: Secondary | ICD-10-CM

## 2013-06-21 DIAGNOSIS — N859 Noninflammatory disorder of uterus, unspecified: Secondary | ICD-10-CM | POA: Diagnosis not present

## 2013-06-21 DIAGNOSIS — F3289 Other specified depressive episodes: Secondary | ICD-10-CM

## 2013-06-21 DIAGNOSIS — I509 Heart failure, unspecified: Secondary | ICD-10-CM | POA: Diagnosis not present

## 2013-06-21 DIAGNOSIS — B9681 Helicobacter pylori [H. pylori] as the cause of diseases classified elsewhere: Secondary | ICD-10-CM

## 2013-06-21 DIAGNOSIS — R413 Other amnesia: Secondary | ICD-10-CM

## 2013-06-21 DIAGNOSIS — A048 Other specified bacterial intestinal infections: Secondary | ICD-10-CM

## 2013-06-21 MED ORDER — CEFUROXIME AXETIL 250 MG PO TABS: 250.0000 mg | ORAL_TABLET | Freq: Two times a day (BID) | ORAL | Status: DC

## 2013-06-21 MED ORDER — OMEPRAZOLE 40 MG PO CPDR: DELAYED_RELEASE_CAPSULE | ORAL | Status: DC

## 2013-06-21 MED ORDER — FLUOXETINE HCL 10 MG PO TABS: 10.0000 mg | ORAL_TABLET | Freq: Every day | ORAL | Status: DC

## 2013-06-21 NOTE — Assessment & Plan Note (Signed)
Not taking Fluoxetine - re-start

## 2013-06-21 NOTE — Assessment & Plan Note (Signed)
Will start Prilosec. Will use abx later when she is more likely to tolerate Rx.

## 2013-06-21 NOTE — Assessment & Plan Note (Signed)
Off Aricept Will address H. Pylori gastritis w/Prilosec Treat UTI

## 2013-06-21 NOTE — Assessment & Plan Note (Signed)
Re-start compr socks

## 2013-06-21 NOTE — Assessment & Plan Note (Signed)
Off Aricept now

## 2013-06-21 NOTE — Assessment & Plan Note (Signed)
Continue with current prescription therapy as reflected on the Med list.  

## 2013-06-21 NOTE — Assessment & Plan Note (Signed)
Not taking Prilosec Asked to re-start

## 2013-06-21 NOTE — Progress Notes (Signed)
Pre visit review using our clinic review tool, if applicable. No additional management support is needed unless otherwise documented below in the visit note. 

## 2013-06-21 NOTE — Assessment & Plan Note (Signed)
Start Abx 

## 2013-06-21 NOTE — Progress Notes (Signed)
Subjective:     HPI  Comes w/dtr  F/u nausea, weakness x 2 -3 months - better; did not fall since off Aricept - no recent syncope. C/o dizziness   F/u Hosp and a NH stay x 1 month.    The patient presents for a follow-up of  chronic hypertension, A fib and chronic dyslipidemia, controlled with medicines.  F/u stiffness in LS spine and arthralgias - much better off Lipitor F/u  memory issues; got lost a few times xmonths - no change   BP Readings from Last 3 Encounters:  06/21/13 104/62  06/10/13 130/80  05/31/13 118/59   Wt Readings from Last 3 Encounters:  06/21/13 170 lb (77.111 kg)  06/10/13 173 lb (78.472 kg)  05/07/13 170 lb 13.7 oz (77.5 kg)      Review of Systems  Constitutional: Negative for activity change, appetite change, fatigue and unexpected weight change.  HENT: Negative for congestion, mouth sores and sinus pressure.   Eyes: Negative for visual disturbance.  Respiratory: Negative for cough and chest tightness.   Genitourinary: Negative for difficulty urinating and vaginal pain.  Musculoskeletal: Negative for gait problem.  Skin: Negative for pallor.  Neurological: Negative for dizziness, tremors, weakness and numbness.  Psychiatric/Behavioral: Positive for behavioral problems and decreased concentration. Negative for suicidal ideas, confusion and sleep disturbance. The patient is nervous/anxious.        Objective:   Physical Exam  Constitutional: She is oriented to person, place, and time. She appears well-developed and well-nourished. No distress.  HENT:  Head: Normocephalic.  Right Ear: External ear normal.  Left Ear: External ear normal.  Nose: Nose normal.  Mouth/Throat: Oropharynx is clear and moist.  Eyes: Conjunctivae are normal. Pupils are equal, round, and reactive to light. Right eye exhibits no discharge. Left eye exhibits no discharge.  Neck: Normal range of motion. Neck supple. No JVD present. No tracheal deviation present. No  thyromegaly present.  Cardiovascular: Normal rate and normal heart sounds.   Pulmonary/Chest: No stridor. No respiratory distress. She has no wheezes.  Abdominal: Soft. Bowel sounds are normal. She exhibits no distension and no mass. There is no tenderness. There is no rebound and no guarding.  R groin w/less fullness, not  tender  Musculoskeletal: She exhibits edema (trace B) and tenderness (LS spine L>>R).  Lymphadenopathy:    She has no cervical adenopathy.  Neurological: She is alert and oriented to person, place, and time. She displays normal reflexes. No cranial nerve deficit. She exhibits normal muscle tone. Coordination (mild) abnormal.  Skin: No rash noted. No erythema.  A lot of veins on LEs  Psychiatric: She has a normal mood and affect. Her behavior is normal. Judgment and thought content normal.  8/14 L eye lower eyelid  3x2 cm oval lump at the L post neck base  Lab Results  Component Value Date   WBC 16.7* 05/10/2013   HGB 11.3* 05/10/2013   HCT 33.7* 05/10/2013   PLT 179 05/10/2013   GLUCOSE 124* 05/08/2013   CHOL 160 03/22/2013   TRIG 89.0 03/22/2013   HDL 63.10 03/22/2013   LDLCALC 79 03/22/2013   ALT 20 05/08/2013   AST 19 05/08/2013   NA 133* 05/08/2013   K 4.2 05/08/2013   CL 99 05/08/2013   CREATININE 1.32* 05/08/2013   BUN 18 05/08/2013   CO2 29 05/08/2013   TSH 0.92 03/22/2013   INR 2.02* 05/10/2013           Assessment & Plan:

## 2013-06-29 ENCOUNTER — Ambulatory Visit: Payer: Medicare Other | Admitting: Internal Medicine

## 2013-06-29 ENCOUNTER — Ambulatory Visit (INDEPENDENT_AMBULATORY_CARE_PROVIDER_SITE_OTHER): Payer: Medicare Other | Admitting: Pharmacist

## 2013-06-29 DIAGNOSIS — I4891 Unspecified atrial fibrillation: Secondary | ICD-10-CM | POA: Diagnosis not present

## 2013-06-29 DIAGNOSIS — Z5181 Encounter for therapeutic drug level monitoring: Secondary | ICD-10-CM | POA: Diagnosis not present

## 2013-06-29 DIAGNOSIS — Z7901 Long term (current) use of anticoagulants: Secondary | ICD-10-CM | POA: Diagnosis not present

## 2013-06-29 LAB — POCT INR: INR: 2.9

## 2013-07-14 ENCOUNTER — Ambulatory Visit (INDEPENDENT_AMBULATORY_CARE_PROVIDER_SITE_OTHER): Payer: Medicare Other | Admitting: Family Medicine

## 2013-07-14 ENCOUNTER — Encounter: Payer: Self-pay | Admitting: Family Medicine

## 2013-07-14 VITALS — BP 118/60 | HR 114 | Temp 98.4°F | Resp 16 | Wt 166.0 lb

## 2013-07-14 DIAGNOSIS — J029 Acute pharyngitis, unspecified: Secondary | ICD-10-CM

## 2013-07-14 MED ORDER — AMOXICILLIN-POT CLAVULANATE 875-125 MG PO TABS: 1.0000 | ORAL_TABLET | Freq: Two times a day (BID) | ORAL | Status: DC

## 2013-07-14 NOTE — Progress Notes (Signed)
Pre-visit discussion using our clinic review tool. No additional management support is needed unless otherwise documented below in the visit note.  

## 2013-07-14 NOTE — Patient Instructions (Signed)
Very nice to meet you Augmentin twice daily for next 10 days If you feel worse, have trouble eating, or trouble breathing more than usual please call or come back or go to emergency immediately Follow up with Walker Kehr, MD in 1-2 weeks.   Tonsillitis Tonsillitis is an infection of the throat that causes the tonsils to become red, tender, and swollen. Tonsils are collections of lymphoid tissue at the back of the throat. Each tonsil has crevices (crypts). Tonsils help fight nose and throat infections and keep infection from spreading to other parts of the body for the first 18 months of life.  CAUSES Sudden (acute) tonsillitis is usually caused by infection with streptococcal bacteria. Long-lasting (chronic) tonsillitis occurs when the crypts of the tonsils become filled with pieces of food and bacteria, which makes it easy for the tonsils to become repeatedly infected. SYMPTOMS  Symptoms of tonsillitis include:  A sore throat, with possible difficulty swallowing.  White patches on the tonsils.  Fever.  Tiredness.  New episodes of snoring during sleep, when you did not snore before.  Small, foul-smelling, yellowish-white pieces of material (tonsilloliths) that you occasionally cough up or spit out. The tonsilloliths can also cause you to have bad breath. DIAGNOSIS Tonsillitis can be diagnosed through a physical exam. Diagnosis can be confirmed with the results of lab tests, including a throat culture. TREATMENT  The goals of tonsillitis treatment include the reduction of the severity and duration of symptoms and prevention of associated conditions. Symptoms of tonsillitis can be improved with the use of steroids to reduce the swelling. Tonsillitis caused by bacteria can be treated with antibiotics. Usually, treatment with antibiotics is started before the cause of the tonsillitis is known. However, if it is determined that the cause is not bacterial, antibiotics will not treat the  tonsillitis. If attacks of tonsillitis are severe and frequent, your caregiver may recommend surgery to remove the tonsils (tonsillectomy). HOME CARE INSTRUCTIONS   Rest as much as possible and get plenty of sleep.  Drink plenty of fluids. While the throat is very sore, eat soft foods or liquids, such as sherbet, soups, or instant breakfast drinks.  Eat frozen ice pops.  Gargle with a warm or cold liquid to help soothe the throat. Mix 1/4 teaspoon of salt and 1/4 teaspoon of baking soda in in 8 oz of water. SEEK MEDICAL CARE IF:   Large, tender lumps develop in your neck.  A rash develops.  A green, yellow-brown, or bloody substance is coughed up.  You are unable to swallow liquids or food for 24 hours.  You notice that only one of the tonsils is swollen. SEEK IMMEDIATE MEDICAL CARE IF:   You develop any new symptoms such as vomiting, severe headache, stiff neck, chest pain, or trouble breathing or swallowing.  You have severe throat pain along with drooling or voice changes.  You have severe pain, unrelieved with recommended medications.  You are unable to fully open the mouth.  You develop redness, swelling, or severe pain anywhere in the neck.  You have a fever. MAKE SURE YOU:   Understand these instructions.  Will watch your condition.  Will get help right away if you are not doing well or get worse. Document Released: 03/20/2005 Document Revised: 02/10/2013 Document Reviewed: 11/27/2012 [facility] Patient Information 2014 [place], [place].

## 2013-07-14 NOTE — Progress Notes (Signed)
SUBJECTIVE:  Virginia Young is a 73 y.o. female who complains of congestion, sore throat, nasal blockage, productive cough, chills, pain while swallowing, white spots in throat and enlarged tonsils for 3 days. She denies a history of congestive heart failure and mitral valve regurg. Patient denies smoke cigarettes. Positive sick contacts in house recently.  Past Medical History  Diagnosis Date  . NICM (nonischemic cardiomyopathy)     a. neg CLite in 2003;  b. EF 40-45% in past;   c.  Echo 12/11: EF 35-40%, mild MR, mild LAE, mild RAE, small pericardial effusion   . Mild mitral regurgitation   . HLD (hyperlipidemia)   . Chronic systolic heart failure   . Atrial fibrillation     a. coumadin;  b. Amiodarone  . Stasis ulcer   . Absence of menstruation   . Family history of ischemic heart disease   . Varicose veins   . Depression   . Osteoarthritis   . Chest pain 03/2012    a. Lex MV 10/13:  EF 64%, dist ant and apical defect sugg of soft tissue atten, no ischemia  . Cataract   . GERD (gastroesophageal reflux disease)    Past Surgical History  Procedure Laterality Date  . Lipoma excision      h/o removed from upper back x 2  . Inguinal hernia repair      right      OBJECTIVE: Blood pressure 118/60, pulse 114, temperature 98.4 F (36.9 C), temperature source Oral, resp. rate 16, weight 166 lb 0.6 oz (75.315 kg), SpO2 95.00%.  Patient appears moderately ill. Temp as noted above. Exudative pharyngo-tonsillitis is noted. Anterior cervical nodes are present.  Ears are normal, chest is clear.  . No rashes. No hepatosplenomegaly. He regular rate and rhythm apart, lungs do have transmitted upper airway noises but no focal finding.   ASSESSMENT:  strep pharyngitis  PLAN: Symptomatic therapy suggested: push fluids, rest and return office visit prn if symptoms persist or worsen. Medications per orders. Call or return to clinic prn if these symptoms worsen or fail to improve as  anticipated.

## 2013-07-23 ENCOUNTER — Ambulatory Visit (INDEPENDENT_AMBULATORY_CARE_PROVIDER_SITE_OTHER)
Admission: RE | Admit: 2013-07-23 | Discharge: 2013-07-23 | Disposition: A | Discharge status: Home / Self Care | Payer: Medicare Other | Source: Ambulatory Visit | Attending: Internal Medicine | Admitting: Internal Medicine

## 2013-07-23 ENCOUNTER — Encounter: Payer: Self-pay | Admitting: Internal Medicine

## 2013-07-23 ENCOUNTER — Ambulatory Visit (INDEPENDENT_AMBULATORY_CARE_PROVIDER_SITE_OTHER): Payer: Medicare Other | Admitting: Internal Medicine

## 2013-07-23 ENCOUNTER — Other Ambulatory Visit (INDEPENDENT_AMBULATORY_CARE_PROVIDER_SITE_OTHER): Payer: Medicare Other

## 2013-07-23 VITALS — BP 130/74 | HR 80 | Temp 97.6°F | Resp 16 | Wt 169.0 lb

## 2013-07-23 DIAGNOSIS — R0600 Dyspnea, unspecified: Secondary | ICD-10-CM

## 2013-07-23 DIAGNOSIS — R0989 Other specified symptoms and signs involving the circulatory and respiratory systems: Secondary | ICD-10-CM

## 2013-07-23 DIAGNOSIS — N390 Urinary tract infection, site not specified: Secondary | ICD-10-CM

## 2013-07-23 DIAGNOSIS — I509 Heart failure, unspecified: Secondary | ICD-10-CM | POA: Diagnosis not present

## 2013-07-23 DIAGNOSIS — R0609 Other forms of dyspnea: Secondary | ICD-10-CM

## 2013-07-23 DIAGNOSIS — J449 Chronic obstructive pulmonary disease, unspecified: Secondary | ICD-10-CM | POA: Diagnosis not present

## 2013-07-23 LAB — URINALYSIS
Bilirubin Urine: NEGATIVE
Hgb urine dipstick: NEGATIVE
Ketones, ur: NEGATIVE
Leukocytes, UA: NEGATIVE
Nitrite: NEGATIVE
Specific Gravity, Urine: <=1.005 — AB (ref 1.000–1.030)
Total Protein, Urine: NEGATIVE
Urine Glucose: NEGATIVE
Urobilinogen, UA: 0.2 (ref 0.0–1.0)
pH: 7 (ref 5.0–8.0)

## 2013-07-23 LAB — BASIC METABOLIC PANEL
BUN: 17 mg/dL (ref 6–23)
CO2: 29 mEq/L (ref 19–32)
Calcium: 8.8 mg/dL (ref 8.4–10.5)
Chloride: 100 mEq/L (ref 96–112)
Creatinine, Ser: 1 mg/dL (ref 0.4–1.2)
GFR: 60.46 mL/min (ref >60.00)
Glucose, Bld: 105 mg/dL — ABNORMAL HIGH (ref 70–99)
Potassium: 4.2 mEq/L (ref 3.5–5.1)
Sodium: 135 mEq/L (ref 135–145)

## 2013-07-23 MED ORDER — SPIRONOLACTONE 25 MG PO TABS: 25.0000 mg | ORAL_TABLET | Freq: Every day | ORAL | Status: DC

## 2013-07-23 NOTE — Progress Notes (Signed)
Pre visit review using our clinic review tool, if applicable. No additional management support is needed unless otherwise documented below in the visit note. 

## 2013-07-23 NOTE — Assessment & Plan Note (Signed)
Will add Spironolactone CXR

## 2013-07-23 NOTE — Assessment & Plan Note (Signed)
Better UA

## 2013-07-23 NOTE — Assessment & Plan Note (Signed)
CXR BNP Added Spironolactone

## 2013-07-23 NOTE — Progress Notes (Signed)
   Subjective:     HPI  C/o weakness - not new, tired and SOB  F/u nausea, weakness x 2 -3 months - much better; did not fall since off Aricept - no recent syncope.   The patient presents for a follow-up of  chronic hypertension, A fib and chronic dyslipidemia, controlled with medicines.  F/u stiffness in LS spine and arthralgias - much better off Lipitor F/u  memory issues; got lost a few times xmonths - no change   BP Readings from Last 3 Encounters:  07/23/13 130/74  07/14/13 118/60  06/21/13 104/62   Wt Readings from Last 3 Encounters:  07/23/13 169 lb (76.658 kg)  07/14/13 166 lb 0.6 oz (75.315 kg)  06/21/13 170 lb (77.111 kg)      Review of Systems  Constitutional: Negative for activity change, appetite change, fatigue and unexpected weight change.  HENT: Negative for congestion, mouth sores and sinus pressure.   Eyes: Negative for visual disturbance.  Respiratory: Negative for cough and chest tightness.   Genitourinary: Negative for difficulty urinating and vaginal pain.  Musculoskeletal: Negative for gait problem.  Skin: Negative for pallor.  Neurological: Negative for dizziness, tremors, weakness and numbness.  Psychiatric/Behavioral: Positive for behavioral problems and decreased concentration. Negative for suicidal ideas, confusion and sleep disturbance. The patient is nervous/anxious.        Objective:   Physical Exam  Constitutional: She is oriented to person, place, and time. She appears well-developed and well-nourished. No distress.  HENT:  Head: Normocephalic.  Right Ear: External ear normal.  Left Ear: External ear normal.  Nose: Nose normal.  Mouth/Throat: Oropharynx is clear and moist.  Eyes: Conjunctivae are normal. Pupils are equal, round, and reactive to light. Right eye exhibits no discharge. Left eye exhibits no discharge.  Neck: Normal range of motion. Neck supple. No JVD present. No tracheal deviation present. No thyromegaly present.   Cardiovascular: Normal rate and normal heart sounds.   Pulmonary/Chest: No stridor. No respiratory distress. She has no wheezes.  Abdominal: Soft. Bowel sounds are normal. She exhibits no distension and no mass. There is no tenderness. There is no rebound and no guarding.  R groin w/less fullness, not  tender  Musculoskeletal: She exhibits edema (trace B) and tenderness (LS spine L>>R).  Lymphadenopathy:    She has no cervical adenopathy.  Neurological: She is alert and oriented to person, place, and time. She displays normal reflexes. No cranial nerve deficit. She exhibits normal muscle tone. Coordination (mild) abnormal.  Skin: No rash noted. No erythema.  A lot of veins on LEs  Psychiatric: She has a normal mood and affect. Her behavior is normal. Judgment and thought content normal.  8/14 L eye lower eyelid  3x2 cm oval lump at the L post neck base  Lab Results  Component Value Date   WBC 9.8 06/10/2013   HGB 13.1 06/10/2013   HCT 39.3 06/10/2013   PLT 323.0 06/10/2013   GLUCOSE 93 06/10/2013   CHOL 160 03/22/2013   TRIG 89.0 03/22/2013   HDL 63.10 03/22/2013   LDLCALC 79 03/22/2013   ALT 16 06/10/2013   AST 19 06/10/2013   NA 138 06/10/2013   K 4.3 06/10/2013   CL 104 06/10/2013   CREATININE 1.2 06/10/2013   BUN 15 06/10/2013   CO2 29 06/10/2013   TSH 0.92 03/22/2013   INR 2.9 06/29/2013           Assessment & Plan:

## 2013-07-26 LAB — BRAIN NATRIURETIC PEPTIDE: Pro B Natriuretic peptide (BNP): 132 pg/mL — ABNORMAL HIGH (ref 0.0–100.0)

## 2013-08-20 ENCOUNTER — Ambulatory Visit: Payer: Medicare Other | Admitting: Internal Medicine

## 2013-08-20 ENCOUNTER — Ambulatory Visit (INDEPENDENT_AMBULATORY_CARE_PROVIDER_SITE_OTHER): Payer: Medicare Other | Admitting: General Practice

## 2013-08-20 ENCOUNTER — Encounter: Payer: Self-pay | Admitting: Internal Medicine

## 2013-08-20 VITALS — BP 120/68 | HR 76 | Temp 97.0°F | Resp 16

## 2013-08-20 DIAGNOSIS — R110 Nausea: Secondary | ICD-10-CM

## 2013-08-20 DIAGNOSIS — I4891 Unspecified atrial fibrillation: Secondary | ICD-10-CM | POA: Diagnosis not present

## 2013-08-20 DIAGNOSIS — R0600 Dyspnea, unspecified: Secondary | ICD-10-CM

## 2013-08-20 DIAGNOSIS — Z7901 Long term (current) use of anticoagulants: Secondary | ICD-10-CM

## 2013-08-20 DIAGNOSIS — R0609 Other forms of dyspnea: Secondary | ICD-10-CM

## 2013-08-20 DIAGNOSIS — J019 Acute sinusitis, unspecified: Secondary | ICD-10-CM | POA: Insufficient documentation

## 2013-08-20 DIAGNOSIS — I509 Heart failure, unspecified: Secondary | ICD-10-CM

## 2013-08-20 DIAGNOSIS — Z5181 Encounter for therapeutic drug level monitoring: Secondary | ICD-10-CM | POA: Diagnosis not present

## 2013-08-20 DIAGNOSIS — H6123 Impacted cerumen, bilateral: Secondary | ICD-10-CM | POA: Insufficient documentation

## 2013-08-20 LAB — POCT INR: INR: 5.5

## 2013-08-20 MED ORDER — AMOXICILLIN 875 MG PO TABS: 875.0000 mg | ORAL_TABLET | Freq: Two times a day (BID) | ORAL | Status: DC

## 2013-08-20 NOTE — Assessment & Plan Note (Signed)
Will Rx Amoxicillin

## 2013-08-20 NOTE — Progress Notes (Signed)
Pre visit review using our clinic review tool, if applicable. No additional management support is needed unless otherwise documented below in the visit note. 

## 2013-08-20 NOTE — Assessment & Plan Note (Signed)
Continue with current prescription therapy as reflected on the Med list.  

## 2013-08-20 NOTE — Progress Notes (Signed)
Subjective:     HPI  C/o weakness - not new, tired and SOB - no better  C/o sinus congestion and bleeding  F/u nausea, weakness x 2 -3 months - much better; did not fall since off Aricept - no recent syncope.   The patient presents for a follow-up of  chronic hypertension, A fib and chronic dyslipidemia, controlled with medicines.  F/u stiffness in LS spine and arthralgias - much better off Lipitor F/u  memory issues; got lost a few times x months - no change   BP Readings from Last 3 Encounters:  08/20/13 120/68  07/23/13 130/74  07/14/13 118/60   Wt Readings from Last 3 Encounters:  07/23/13 169 lb (76.658 kg)  07/14/13 166 lb 0.6 oz (75.315 kg)  06/21/13 170 lb (77.111 kg)      Review of Systems  Constitutional: Negative for activity change, appetite change, fatigue and unexpected weight change.  HENT: Negative for congestion, mouth sores and sinus pressure.   Eyes: Negative for visual disturbance.  Respiratory: Negative for cough and chest tightness.   Genitourinary: Negative for difficulty urinating and vaginal pain.  Musculoskeletal: Negative for gait problem.  Skin: Negative for pallor.  Neurological: Negative for dizziness, tremors, weakness and numbness.  Psychiatric/Behavioral: Positive for behavioral problems and decreased concentration. Negative for suicidal ideas, confusion and sleep disturbance. The patient is nervous/anxious.        Objective:   Physical Exam  Constitutional: She is oriented to person, place, and time. She appears well-developed and well-nourished. No distress.  HENT:  Head: Normocephalic.  Right Ear: External ear normal.  Left Ear: External ear normal.  Nose: Nose normal.  Mouth/Throat: Oropharynx is clear and moist.  Eyes: Conjunctivae are normal. Pupils are equal, round, and reactive to light. Right eye exhibits no discharge. Left eye exhibits no discharge.  Neck: Normal range of motion. Neck supple. No JVD present. No  tracheal deviation present. No thyromegaly present.  Cardiovascular: Normal rate and normal heart sounds.   Pulmonary/Chest: No stridor. No respiratory distress. She has no wheezes.  Abdominal: Soft. Bowel sounds are normal. She exhibits no distension and no mass. There is no tenderness. There is no rebound and no guarding.  R groin w/less fullness, not  tender  Musculoskeletal: She exhibits tenderness (LS spine L>>R). She exhibits no edema.  Lymphadenopathy:    She has no cervical adenopathy.  Neurological: She is alert and oriented to person, place, and time. She displays normal reflexes. No cranial nerve deficit. She exhibits normal muscle tone. Coordination (mild) abnormal.  Skin: No rash noted. No erythema.  A lot of veins on LEs  Psychiatric: She has a normal mood and affect. Her behavior is normal. Judgment and thought content normal.   3x2 cm oval lump at the L post neck base  Eryth throat B wax L>R  Lab Results  Component Value Date   WBC 9.8 06/10/2013   HGB 13.1 06/10/2013   HCT 39.3 06/10/2013   PLT 323.0 06/10/2013   GLUCOSE 105* 07/23/2013   CHOL 160 03/22/2013   TRIG 89.0 03/22/2013   HDL 63.10 03/22/2013   LDLCALC 79 03/22/2013   ALT 16 06/10/2013   AST 19 06/10/2013   NA 135 07/23/2013   K 4.2 07/23/2013   CL 100 07/23/2013   CREATININE 1.0 07/23/2013   BUN 17 07/23/2013   CO2 29 07/23/2013   TSH 0.92 03/22/2013   INR 2.9 06/29/2013     Procedure Note :  Procedure :  Ear irrigation   Indication:  Cerumen impaction B   Risks, including pain, dizziness, eardrum perforation, bleeding, infection and others as well as benefits were explained to the patient in detail. Verbal consent was obtained and the patient agreed to proceed.    We used "The Elephant Ear Irrigation Device" filled with lukewarm water for irrigation. A large amount wax was recovered. Procedure has also required manual wax removal with an ear loop.   Tolerated well. Complications: None.    Postprocedure instructions :  Call if problems.         Assessment & Plan:

## 2013-08-20 NOTE — Assessment & Plan Note (Signed)
Resolved

## 2013-08-20 NOTE — Assessment & Plan Note (Signed)
Stable Continue with current prescription therapy as reflected on the Med list.  

## 2013-08-20 NOTE — Assessment & Plan Note (Signed)
No better Will treat sinusitis empirically

## 2013-08-20 NOTE — Assessment & Plan Note (Signed)
See procedure 

## 2013-08-25 ENCOUNTER — Ambulatory Visit (INDEPENDENT_AMBULATORY_CARE_PROVIDER_SITE_OTHER): Payer: Medicare Other | Admitting: *Deleted

## 2013-08-25 DIAGNOSIS — Z7901 Long term (current) use of anticoagulants: Secondary | ICD-10-CM

## 2013-08-25 DIAGNOSIS — I4891 Unspecified atrial fibrillation: Secondary | ICD-10-CM | POA: Diagnosis not present

## 2013-08-25 DIAGNOSIS — Z5181 Encounter for therapeutic drug level monitoring: Secondary | ICD-10-CM | POA: Diagnosis not present

## 2013-08-25 LAB — POCT INR: INR: 1.3

## 2013-09-03 ENCOUNTER — Ambulatory Visit (INDEPENDENT_AMBULATORY_CARE_PROVIDER_SITE_OTHER): Payer: Medicare Other | Admitting: *Deleted

## 2013-09-03 DIAGNOSIS — I4891 Unspecified atrial fibrillation: Secondary | ICD-10-CM | POA: Diagnosis not present

## 2013-09-03 DIAGNOSIS — Z7901 Long term (current) use of anticoagulants: Secondary | ICD-10-CM

## 2013-09-03 DIAGNOSIS — Z5181 Encounter for therapeutic drug level monitoring: Secondary | ICD-10-CM | POA: Diagnosis not present

## 2013-09-03 LAB — POCT INR: INR: 2.8

## 2013-09-07 ENCOUNTER — Telehealth: Payer: Self-pay | Admitting: Family Medicine

## 2013-09-07 NOTE — Telephone Encounter (Signed)
PT HAVING DIZZINESS AND CONGESTION. APT MADE.

## 2013-09-08 ENCOUNTER — Ambulatory Visit (INDEPENDENT_AMBULATORY_CARE_PROVIDER_SITE_OTHER): Payer: Medicare Other | Admitting: Family Medicine

## 2013-09-08 VITALS — BP 120/70 | HR 66 | Temp 97.1°F | Ht 69.0 in | Wt 161.0 lb

## 2013-09-08 DIAGNOSIS — R42 Dizziness and giddiness: Secondary | ICD-10-CM | POA: Diagnosis not present

## 2013-09-08 MED ORDER — MECLIZINE HCL 25 MG PO TABS: 25.0000 mg | ORAL_TABLET | Freq: Three times a day (TID) | ORAL | Status: DC | PRN

## 2013-09-08 MED ORDER — METHYLPREDNISOLONE (PAK) 4 MG PO TABS: ORAL_TABLET | ORAL | Status: DC

## 2013-09-08 NOTE — Progress Notes (Signed)
   Subjective:    Patient ID: Virginia Young, female    DOB: July 14, 1940, 73 y.o.   MRN: [id number]  HPI  This 73 y.o. female presents for evaluation of sinus congestion and dizziness.  Review of Systems C/o dizziness No chest pain, SOB, HA, dizziness, vision change, N/V, diarrhea, constipation, dysuria, urinary urgency or frequency, myalgias, arthralgias or rash.     Objective:   Physical Exam  Vital signs noted  Well developed well nourished female.  HEENT - Head atraumatic Normocephalic                Eyes - PERRLA, Conjuctiva - clear Sclera- Clear EOMI                Ears - EAC's Wnl TM's Wnl Gross Hearing WNL                Nose - Nares patent                 Throat - oropharanx wnl Respiratory - Lungs CTA bilateral Cardiac - RRR S1 and S2 without murmur GI - Abdomen soft Nontender and bowel sounds active x 4 Extremities - No edema. Neuro - Grossly intact.      Assessment & Plan:  Vertigo - Plan: meclizine (ANTIVERT) 25 MG tablet, methylPREDNIsolone (MEDROL DOSPACK) 4 MG tablet, DISCONTINUED: methylPREDNIsolone (MEDROL DOSPACK) 4 MG tablet, DISCONTINUED: meclizine (ANTIVERT) 25 MG tablet  Lysbeth Penner FNP

## 2013-10-11 ENCOUNTER — Ambulatory Visit (INDEPENDENT_AMBULATORY_CARE_PROVIDER_SITE_OTHER): Payer: Medicare Other | Admitting: Pharmacist

## 2013-10-11 DIAGNOSIS — Z5181 Encounter for therapeutic drug level monitoring: Secondary | ICD-10-CM | POA: Diagnosis not present

## 2013-10-11 DIAGNOSIS — I4891 Unspecified atrial fibrillation: Secondary | ICD-10-CM | POA: Diagnosis not present

## 2013-10-11 LAB — POCT INR: INR: 2.7

## 2013-10-11 MED ORDER — WARFARIN SODIUM 5 MG PO TABS: 2.5000 mg | ORAL_TABLET | Freq: Every day | ORAL | Status: DC

## 2013-10-11 NOTE — Patient Instructions (Signed)
Anticoagulation Dose Instructions as of 10/11/2013     Dorene Grebe Tue Wed Thu Fri Sat   New Dose 2.5 mg 2.5 mg 5 mg 2.5 mg 5 mg 2.5 mg 5 mg    Description       Continue coumadin 1/2 pill everyday except 1 pill on Tuesdays, Thursdays and Saturdays.      INR was 2.7 today

## 2013-10-11 NOTE — Addendum Note (Signed)
Addended by: Cherre Robins on: 10/11/2013 03:23 PM   Modules accepted: Orders

## 2013-11-09 ENCOUNTER — Telehealth: Payer: Self-pay | Admitting: Family Medicine

## 2013-11-10 ENCOUNTER — Other Ambulatory Visit: Payer: Self-pay | Admitting: Family Medicine

## 2013-11-10 DIAGNOSIS — R42 Dizziness and giddiness: Secondary | ICD-10-CM

## 2013-11-10 MED ORDER — MECLIZINE HCL 25 MG PO TABS: 25.0000 mg | ORAL_TABLET | Freq: Three times a day (TID) | ORAL | Status: DC | PRN

## 2013-11-10 MED ORDER — METHYLPREDNISOLONE (PAK) 4 MG PO TABS: ORAL_TABLET | ORAL | Status: DC

## 2013-11-10 NOTE — Telephone Encounter (Signed)
Meclizine sent to pharmacy

## 2013-11-27 DIAGNOSIS — H5200 Hypermetropia, unspecified eye: Secondary | ICD-10-CM | POA: Diagnosis not present

## 2013-11-27 DIAGNOSIS — H26499 Other secondary cataract, unspecified eye: Secondary | ICD-10-CM | POA: Diagnosis not present

## 2013-11-27 DIAGNOSIS — H00029 Hordeolum internum unspecified eye, unspecified eyelid: Secondary | ICD-10-CM | POA: Diagnosis not present

## 2013-11-27 DIAGNOSIS — Z961 Presence of intraocular lens: Secondary | ICD-10-CM | POA: Diagnosis not present

## 2013-12-01 ENCOUNTER — Ambulatory Visit (INDEPENDENT_AMBULATORY_CARE_PROVIDER_SITE_OTHER): Payer: Medicare Other | Admitting: Pharmacist

## 2013-12-01 DIAGNOSIS — I4891 Unspecified atrial fibrillation: Secondary | ICD-10-CM | POA: Diagnosis not present

## 2013-12-01 DIAGNOSIS — Z5181 Encounter for therapeutic drug level monitoring: Secondary | ICD-10-CM | POA: Diagnosis not present

## 2013-12-01 LAB — POCT INR: INR: 2.7

## 2013-12-01 NOTE — Patient Instructions (Signed)
Anticoagulation Dose Instructions as of 12/01/2013     Virginia Young Tue Wed Thu Fri Sat   New Dose 2.5 mg 2.5 mg 5 mg 2.5 mg 5 mg 2.5 mg 5 mg    Description       Continue coumadin 1/2 pill everyday except 1 pill on Tuesdays, Thursdays and Saturdays.      INR was 2.7 today

## 2013-12-27 ENCOUNTER — Encounter: Payer: Self-pay | Admitting: Nurse Practitioner

## 2013-12-27 ENCOUNTER — Ambulatory Visit (INDEPENDENT_AMBULATORY_CARE_PROVIDER_SITE_OTHER): Payer: Medicare Other

## 2013-12-27 ENCOUNTER — Ambulatory Visit (INDEPENDENT_AMBULATORY_CARE_PROVIDER_SITE_OTHER): Payer: Medicare Other | Admitting: Nurse Practitioner

## 2013-12-27 VITALS — BP 140/72 | HR 80 | Temp 98.2°F | Ht 69.0 in | Wt 161.0 lb

## 2013-12-27 DIAGNOSIS — R42 Dizziness and giddiness: Secondary | ICD-10-CM

## 2013-12-27 DIAGNOSIS — S8000XA Contusion of unspecified knee, initial encounter: Secondary | ICD-10-CM

## 2013-12-27 DIAGNOSIS — M25569 Pain in unspecified knee: Secondary | ICD-10-CM | POA: Diagnosis not present

## 2013-12-27 DIAGNOSIS — M25561 Pain in right knee: Secondary | ICD-10-CM

## 2013-12-27 DIAGNOSIS — M25559 Pain in unspecified hip: Secondary | ICD-10-CM | POA: Diagnosis not present

## 2013-12-27 DIAGNOSIS — M25551 Pain in right hip: Secondary | ICD-10-CM

## 2013-12-27 DIAGNOSIS — S8001XA Contusion of right knee, initial encounter: Secondary | ICD-10-CM

## 2013-12-27 MED ORDER — MECLIZINE HCL 25 MG PO TABS: 25.0000 mg | ORAL_TABLET | Freq: Three times a day (TID) | ORAL | Status: DC | PRN

## 2013-12-27 MED ORDER — TRAMADOL HCL 50 MG PO TABS: 50.0000 mg | ORAL_TABLET | Freq: Three times a day (TID) | ORAL | Status: DC | PRN

## 2013-12-27 NOTE — Patient Instructions (Signed)

## 2013-12-27 NOTE — Progress Notes (Signed)
   Subjective:    Patient ID: Virginia Young, female    DOB: 1941/01/11, 73 y.o.   MRN: [id number]  HPI Patient brught in by daughter- They were coming out of [place] yesterday afternoon and she stepped off curb and fail. SHe is in today c/o right knee pain , femur and hip pain- pain with baring weight. She says that her right femur and knee are swollen.    Review of Systems  Musculoskeletal: Positive for arthralgias, gait problem and joint swelling.  All other systems reviewed and are negative.      Objective:   Physical Exam  Constitutional: She is oriented to person, place, and time. She appears well-developed and well-nourished.  Cardiovascular: Normal rate and regular rhythm.   Pulmonary/Chest: Effort normal and breath sounds normal.  Musculoskeletal:  Right anterior knee edema with echymosis. Right femur pain on palpation with edema.  Neurological: She is alert and oriented to person, place, and time.  Skin: Skin is warm and dry.  Psychiatric: She has a normal mood and affect. Her behavior is normal. Judgment and thought content normal.   BP 140/72  Pulse 80  Temp(Src) 98.2 F (36.8 C) (Oral)  Ht 5\' 9"  (1.753 m)  Wt 161 lb (73.029 kg)  BMI 23.76 kg/m2  Right knee x ray- no fracture-Preliminary reading by Ronnald Collum, FNP  [facility] Right femur and hip- no fracture-Preliminary reading by Ronnald Collum, FNP  [facility]        Assessment & Plan:   1. Pain in joint, lower leg, right   2. Knee contusion, right, initial encounter   3. Right hip pain   4. Vertigo    Meds ordered this encounter  Medications  . meclizine (ANTIVERT) 25 MG tablet    Sig: Take 1 tablet (25 mg total) by mouth 3 (three) times daily as needed for dizziness.    Dispense:  30 tablet    Refill:  2    Order Specific Question:  Supervising Provider    Answer:  Chipper Herb [[id number]]  . traMADol (ULTRAM) 50 MG tablet    Sig: Take 1 tablet (50 mg total) by mouth every 8 (eight) hours as  needed.    Dispense:  40 tablet    Refill:  0    Order Specific Question:  Supervising Provider    Answer:  Joycelyn Man    Rest-  Tylenol OTC as needed Meclizine as needed RTO prn  Mary-Margaret Hassell Done, FNP

## 2014-01-05 ENCOUNTER — Encounter: Payer: Self-pay | Admitting: Pharmacist

## 2014-01-05 ENCOUNTER — Ambulatory Visit (INDEPENDENT_AMBULATORY_CARE_PROVIDER_SITE_OTHER): Payer: Medicare Other | Admitting: Pharmacist

## 2014-01-05 VITALS — BP 136/80 | HR 70 | Ht 69.0 in | Wt 174.0 lb

## 2014-01-05 DIAGNOSIS — Z5181 Encounter for therapeutic drug level monitoring: Secondary | ICD-10-CM | POA: Diagnosis not present

## 2014-01-05 DIAGNOSIS — Z0000 Encounter for general adult medical examination without abnormal findings: Secondary | ICD-10-CM | POA: Diagnosis not present

## 2014-01-05 DIAGNOSIS — I4891 Unspecified atrial fibrillation: Secondary | ICD-10-CM

## 2014-01-05 DIAGNOSIS — Z13820 Encounter for screening for osteoporosis: Secondary | ICD-10-CM

## 2014-01-05 LAB — POCT INR: INR: 3.3

## 2014-01-05 NOTE — Patient Instructions (Signed)
Health Maintenance Summary    INFLUENZA VACCINE Next Due 01/22/2014      MAMMOGRAM Next Due 04/07/2015  Lat 04/06/2014    TETANUS/TDAP Next Due 04/26/2020  Last 04/26/2010   Bone Density  Due now     Pneumonia Vaccine Completed     Shingles / Zostavax Vaccine  Completed  04/26/2010     COLONOSCOPY Next Due 10/01/2022  Last 09/30/2012      Anticoagulation Dose Instructions as of 01/05/2014     [place] Tue Wed Thu Fri Sat   New Dose 2.5 mg 2.5 mg 5 mg 2.5 mg 2.5 mg 2.5 mg 5 mg    Description       No warfarin / coumadin today Wednesday, July 15th.  Then decrease  Dose to 1/2 pill everyday except 1 pill on Tuesdays and Saturdays.     Preventive Care for Adults A healthy lifestyle and preventive care can promote health and wellness. Preventive health guidelines for women include the following key practices.  A routine yearly physical is a good way to check with your health care provider about your health and preventive screening. It is a chance to share any concerns and updates on your health and to receive a thorough exam.  Visit your dentist for a routine exam and preventive care every 6 months. Brush your teeth twice a day and floss once a day. Good oral hygiene prevents tooth decay and gum disease.  The frequency of eye exams is based on your age, health, family medical history, use of contact lenses, and other factors. Follow your health care provider's recommendations for frequency of eye exams.  Eat a healthy diet. Foods like vegetables, fruits, whole grains, low-fat dairy products, and lean protein foods contain the nutrients you need without too many calories. Decrease your intake of foods high in solid fats, added sugars, and salt. Eat the right amount of calories for you.Get information about a proper diet from your health care provider, if necessary.  Regular physical exercise is one of the most important things you can do for your health. Most adults should get at least 150 minutes  of moderate-intensity exercise (any activity that increases your heart rate and causes you to sweat) each week. In addition, most adults need muscle-strengthening exercises on 2 or more days a week.  Maintain a healthy weight. The body mass index (BMI) is a screening tool to identify possible weight problems. It provides an estimate of body fat based on height and weight. Your health care provider can find your BMI, and can help you achieve or maintain a healthy weight.For adults 20 years and older:  A BMI below 18.5 is considered underweight.  A BMI of 18.5 to 24.9 is normal.  A BMI of 25 to 29.9 is considered overweight.  A BMI of 30 and above is considered obese.  Maintain normal blood lipids and cholesterol levels by exercising and minimizing your intake of saturated fat. Eat a balanced diet with plenty of fruit and vegetables. Blood tests for lipids and cholesterol should begin at age 20 and be repeated every 5 years. If your lipid or cholesterol levels are high, you are over 50, or you are at high risk for heart disease, you may need your cholesterol levels checked more frequently.Ongoing high lipid and cholesterol levels should be treated with medicines if diet and exercise are not working.  If you smoke, find out from your health care provider how to quit. If you do not use tobacco,  do not start.  Lung cancer screening is recommended for adults aged 26-80 years who are at high risk for developing lung cancer because of a history of smoking. A yearly low-dose CT scan of the lungs is recommended for people who have at least a 30-pack-year history of smoking and are a current smoker or have quit within the past 15 years. A pack year of smoking is smoking an average of 1 pack of cigarettes a day for 1 year (for example: 1 pack a day for 30 years or 2 packs a day for 15 years). Yearly screening should continue until the smoker has stopped smoking for at least 15 years. Yearly screening should be  stopped for people who develop a health problem that would prevent them from having lung cancer treatment.  If you are pregnant, do not drink alcohol. If you are breastfeeding, be very cautious about drinking alcohol. If you are not pregnant and choose to drink alcohol, do not have more than 1 drink per day. One drink is considered to be 12 ounces (355 mL) of beer, 5 ounces (148 mL) of wine, or 1.5 ounces (44 mL) of liquor.  Avoid use of street drugs. Do not share needles with anyone. Ask for help if you need support or instructions about stopping the use of drugs.  High blood pressure causes heart disease and increases the risk of stroke. Your blood pressure should be checked at least every 1 to 2 years. Ongoing high blood pressure should be treated with medicines if weight loss and exercise do not work.  If you are 43-57 years old, ask your health care provider if you should take aspirin to prevent strokes.  Diabetes screening involves taking a blood sample to check your fasting blood sugar level. This should be done once every 3 years, after age 2, if you are within normal weight and without risk factors for diabetes. Testing should be considered at a younger age or be carried out more frequently if you are overweight and have at least 1 risk factor for diabetes.  Breast cancer screening is essential preventive care for women. You should practice "breast self-awareness." This means understanding the normal appearance and feel of your breasts and may include breast self-examination. Any changes detected, no matter how small, should be reported to a health care provider. Women in their 14s and 30s should have a clinical breast exam (CBE) by a health care provider as part of a regular health exam every 1 to 3 years. After age 6, women should have a CBE every year. Starting at age 48, women should consider having a mammogram (breast X-ray test) every year. Women who have a family history of breast  cancer should talk to their health care provider about genetic screening. Women at a high risk of breast cancer should talk to their health care providers about having an MRI and a mammogram every year.  Breast cancer gene (BRCA)-related cancer risk assessment is recommended for women who have family members with BRCA-related cancers. BRCA-related cancers include breast, ovarian, tubal, and peritoneal cancers. Having family members with these cancers may be associated with an increased risk for harmful changes (mutations) in the breast cancer genes BRCA1 and BRCA2. Results of the assessment will determine the need for genetic counseling and BRCA1 and BRCA2 testing.  Routine pelvic exams to screen for cancer are no longer recommended for nonpregnant women who are considered low risk for cancer of the pelvic organs (ovaries, uterus, and vagina) and who  do not have symptoms. Ask your health care provider if a screening pelvic exam is right for you.  If you have had past treatment for cervical cancer or a condition that could lead to cancer, you need Pap tests and screening for cancer for at least 20 years after your treatment. If Pap tests have been discontinued, your risk factors (such as having a new sexual partner) need to be reassessed to determine if screening should be resumed. Some women have medical problems that increase the chance of getting cervical cancer. In these cases, your health care provider may recommend more frequent screening and Pap tests.  The HPV test is an additional test that may be used for cervical cancer screening. The HPV test looks for the virus that can cause the cell changes on the cervix. The cells collected during the Pap test can be tested for HPV. The HPV test could be used to screen women aged 80 years and older, and should be used in women of any age who have unclear Pap test results. After the age of 26, women should have HPV testing at the same frequency as a Pap  test.  Colorectal cancer can be detected and often prevented. Most routine colorectal cancer screening begins at the age of 71 years and continues through age 80 years. However, your health care provider may recommend screening at an earlier age if you have risk factors for colon cancer. On a yearly basis, your health care provider may provide home test kits to check for hidden blood in the stool. Use of a small camera at the end of a tube, to directly examine the colon (sigmoidoscopy or colonoscopy), can detect the earliest forms of colorectal cancer. Talk to your health care provider about this at age 72, when routine screening begins. Direct exam of the colon should be repeated every 5-10 years through age 59 years, unless early forms of pre-cancerous polyps or small growths are found.  People who are at an increased risk for hepatitis B should be screened for this virus. You are considered at high risk for hepatitis B if:  You were born in a country where hepatitis B occurs often. Talk with your health care provider about which countries are considered high risk.  Your parents were born in a high-risk country and you have not received a shot to protect against hepatitis B (hepatitis B vaccine).  You have HIV or AIDS.  You use needles to inject street drugs.  You live with, or have sex with, someone who has Hepatitis B.  You get hemodialysis treatment.  You take certain medicines for conditions like cancer, organ transplantation, and autoimmune conditions.  Hepatitis C blood testing is recommended for all people born from [birth year] through 1966 and any individual with known risks for hepatitis C.  Practice safe sex. Use condoms and avoid high-risk sexual practices to reduce the spread of sexually transmitted infections (STIs). STIs include gonorrhea, chlamydia, syphilis, trichomonas, herpes, HPV, and human immunodeficiency virus (HIV). Herpes, HIV, and HPV are viral illnesses that have no cure.  They can result in disability, cancer, and death.  You should be screened for sexually transmitted illnesses (STIs) including gonorrhea and chlamydia if:  You are sexually active and are younger than 24 years.  You are older than 24 years and your health care provider tells you that you are at risk for this type of infection.  Your sexual activity has changed since you were last screened and you are at an  increased risk for chlamydia or gonorrhea. Ask your health care provider if you are at risk.  If you are at risk of being infected with HIV, it is recommended that you take a prescription medicine daily to prevent HIV infection. This is called preexposure prophylaxis (PrEP). You are considered at risk if:  You are a heterosexual woman, are sexually active, and are at increased risk for HIV infection.  You take drugs by injection.  You are sexually active with a partner who has HIV.  Talk with your health care provider about whether you are at high risk of being infected with HIV. If you choose to begin PrEP, you should first be tested for HIV. You should then be tested every 3 months for as long as you are taking PrEP.  Osteoporosis is a disease in which the bones lose minerals and strength with aging. This can result in serious bone fractures or breaks. The risk of osteoporosis can be identified using a bone density scan. Women ages 76 years and over and women at risk for fractures or osteoporosis should discuss screening with their health care providers. Ask your health care provider whether you should take a calcium supplement or vitamin D to reduce the rate of osteoporosis.  Menopause can be associated with physical symptoms and risks. Hormone replacement therapy is available to decrease symptoms and risks. You should talk to your health care provider about whether hormone replacement therapy is right for you.  Use sunscreen. Apply sunscreen liberally and repeatedly throughout the day.  You should seek shade when your shadow is shorter than you. Protect yourself by wearing long sleeves, pants, a wide-brimmed hat, and sunglasses year round, whenever you are outdoors.  Once a month, do a whole body skin exam, using a mirror to look at the skin on your back. Tell your health care provider of new moles, moles that have irregular borders, moles that are larger than a pencil eraser, or moles that have changed in shape or color.  Stay current with required vaccines (immunizations).  Influenza vaccine. All adults should be immunized every year.  Tetanus, diphtheria, and acellular pertussis (Td, Tdap) vaccine. Pregnant women should receive 1 dose of Tdap vaccine during each pregnancy. The dose should be obtained regardless of the length of time since the last dose. Immunization is preferred during the 27th-36th week of gestation. An adult who has not previously received Tdap or who does not know her vaccine status should receive 1 dose of Tdap. This initial dose should be followed by tetanus and diphtheria toxoids (Td) booster doses every 10 years. Adults with an unknown or incomplete history of completing a 3-dose immunization series with Td-containing vaccines should begin or complete a primary immunization series including a Tdap dose. Adults should receive a Td booster every 10 years.  Varicella vaccine. An adult without evidence of immunity to varicella should receive 2 doses or a second dose if she has previously received 1 dose. Pregnant females who do not have evidence of immunity should receive the first dose after pregnancy. This first dose should be obtained before leaving the health care facility. The second dose should be obtained 4-8 weeks after the first dose.  Human papillomavirus (HPV) vaccine. Females aged 13-26 years who have not received the vaccine previously should obtain the 3-dose series. The vaccine is not recommended for use in pregnant females. However, pregnancy  testing is not needed before receiving a dose. If a female is found to be pregnant after receiving a  dose, no treatment is needed. In that case, the remaining doses should be delayed until after the pregnancy. Immunization is recommended for any person with an immunocompromised condition through the age of 25 years if she did not get any or all doses earlier. During the 3-dose series, the second dose should be obtained 4-8 weeks after the first dose. The third dose should be obtained 24 weeks after the first dose and 16 weeks after the second dose.  Zoster vaccine. One dose is recommended for adults aged 3 years or older unless certain conditions are present.  Measles, mumps, and rubella (MMR) vaccine. Adults born before [birth year] generally are considered immune to measles and mumps. Adults born in [birth year] or later should have 1 or more doses of MMR vaccine unless there is a contraindication to the vaccine or there is laboratory evidence of immunity to each of the three diseases. A routine second dose of MMR vaccine should be obtained at least 28 days after the first dose for students attending postsecondary schools, health care workers, or international travelers. People who received inactivated measles vaccine or an unknown type of measles vaccine during 1963-1967 should receive 2 doses of MMR vaccine. People who received inactivated mumps vaccine or an unknown type of mumps vaccine before 1979 and are at high risk for mumps infection should consider immunization with 2 doses of MMR vaccine. For females of childbearing age, rubella immunity should be determined. If there is no evidence of immunity, females who are not pregnant should be vaccinated. If there is no evidence of immunity, females who are pregnant should delay immunization until after pregnancy. Unvaccinated health care workers born before [birth year] who lack laboratory evidence of measles, mumps, or rubella immunity or laboratory confirmation of disease should  consider measles and mumps immunization with 2 doses of MMR vaccine or rubella immunization with 1 dose of MMR vaccine.  Pneumococcal 13-valent conjugate (PCV13) vaccine. When indicated, a person who is uncertain of her immunization history and has no record of immunization should receive the PCV13 vaccine. An adult aged 48 years or older who has certain medical conditions and has not been previously immunized should receive 1 dose of PCV13 vaccine. This PCV13 should be followed with a dose of pneumococcal polysaccharide (PPSV23) vaccine. The PPSV23 vaccine dose should be obtained at least 8 weeks after the dose of PCV13 vaccine. An adult aged 71 years or older who has certain medical conditions and previously received 1 or more doses of PPSV23 vaccine should receive 1 dose of PCV13. The PCV13 vaccine dose should be obtained 1 or more years after the last PPSV23 vaccine dose.  Pneumococcal polysaccharide (PPSV23) vaccine. When PCV13 is also indicated, PCV13 should be obtained first. All adults aged 65 years and older should be immunized. An adult younger than age 24 years who has certain medical conditions should be immunized. Any person who resides in a nursing home or long-term care facility should be immunized. An adult smoker should be immunized. People with an immunocompromised condition and certain other conditions should receive both PCV13 and PPSV23 vaccines. People with human immunodeficiency virus (HIV) infection should be immunized as soon as possible after diagnosis. Immunization during chemotherapy or radiation therapy should be avoided. Routine use of PPSV23 vaccine is not recommended for American Indians, [place]atives, or people younger than 65 years unless there are medical conditions that require PPSV23 vaccine. When indicated, people who have unknown immunization and have no record of immunization should receive PPSV23 vaccine. One-time revaccination 5  years after the first dose of PPSV23 is  recommended for people aged 19-64 years who have chronic kidney failure, nephrotic syndrome, asplenia, or immunocompromised conditions. People who received 1-2 doses of PPSV23 before age 75 years should receive another dose of PPSV23 vaccine at age 11 years or later if at least 5 years have passed since the previous dose. Doses of PPSV23 are not needed for people immunized with PPSV23 at or after age 76 years.  Meningococcal vaccine. Adults with asplenia or persistent complement component deficiencies should receive 2 doses of quadrivalent meningococcal conjugate (MenACWY-D) vaccine. The doses should be obtained at least 2 months apart. Microbiologists working with certain meningococcal bacteria, [employer] recruits, people at risk during an outbreak, and people who travel to or live in countries with a high rate of meningitis should be immunized. A first-year college student up through age 93 years who is living in a residence hall should receive a dose if she did not receive a dose on or after her 16th birthday. Adults who have certain high-risk conditions should receive one or more doses of vaccine.  Hepatitis A vaccine. Adults who wish to be protected from this disease, have certain high-risk conditions, work with hepatitis A-infected animals, work in hepatitis A research labs, or travel to or work in countries with a high rate of hepatitis A should be immunized. Adults who were previously unvaccinated and who anticipate close contact with an international adoptee during the first 60 days after arrival in the [place] from a country with a high rate of hepatitis A should be immunized.  Hepatitis B vaccine. Adults who wish to be protected from this disease, have certain high-risk conditions, may be exposed to blood or other infectious body fluids, are household contacts or sex partners of hepatitis B positive people, are clients or workers in certain care facilities, or travel to or work in countries  with a high rate of hepatitis B should be immunized.

## 2014-01-05 NOTE — Progress Notes (Signed)
Subjective:    Virginia Young is a 73 y.o. female who presents for Medicare Initial Annual Wellenss Visit and recheck Protime / INR  Preventive Screening-Counseling & Management  Tobacco History  Smoking status  . Never Smoker   Smokeless tobacco  . Never Used     Current Problems (verified) Patient Active Problem List   Diagnosis Date Noted  . Impacted cerumen of both ears 08/20/2013  . Encounter for therapeutic drug monitoring 08/20/2013  . DOE (dyspnea on exertion) 07/23/2013  . Helicobacter positive gastritis 06/21/2013  . Nausea alone 06/10/2013  . Orthostatic hypotension 06/10/2013  . UTI (lower urinary tract infection) 05/06/2013  . Neck mass 02/19/2013  . Neck pain, bilateral 02/19/2013  . Right groin mass 11/08/2011  . LBP (low back pain) 11/08/2011  . Lipoma of neck 06/21/2011  . Memory loss 06/21/2011  . OVERACTIVE BLADDER 08/21/2010  . Long term current use of anticoagulant 07/25/2010  . OSTEOARTHRITIS 01/18/2010  . KNEE PAIN 01/18/2010  . FOOT PAIN 01/18/2010  . Hyperlipidemia with target LDL less than 100 04/20/2009  . GERD 04/17/2009  . MITRAL REGURGITATION, MILD 08/30/2008  . CARDIOMYOPATHY 08/30/2008  . CONGESTIVE HEART FAILURE 04/13/2007  . DEPRESSION 04/10/2007  . DISEASE, MITRAL VALVE NEC/NOS 04/10/2007  . Atrial fibrillation 04/10/2007  . STASIS ULCER 04/10/2007  . VENOUS INSUFFICIENCY, LEGS 04/10/2007    Medications Prior to Visit Current Outpatient Prescriptions on File Prior to Visit  Medication Sig Dispense Refill  . amiodarone (PACERONE) 200 MG tablet take 1 tablet by mouth once daily  90 tablet  3  . Cholecalciferol (VITAMIN D3) 2000 UNITS TABS Take 1 tablet by mouth daily.  100 tablet  3  . meclizine (ANTIVERT) 25 MG tablet Take 1 tablet (25 mg total) by mouth 3 (three) times daily as needed for dizziness.  30 tablet  2  . omeprazole (PRILOSEC) 40 MG capsule take 1 capsule by mouth once daily  90 capsule  3  . traMADol (ULTRAM) 50  MG tablet Take 1 tablet (50 mg total) by mouth every 8 (eight) hours as needed.  40 tablet  0  . warfarin (COUMADIN) 5 MG tablet Take 0.5-1 tablets (2.5-5 mg total) by mouth daily. Takes 5mg  Tuesday, Thursday and  Takes 2.5mg  all other days.  90 tablet  1  . FLUoxetine (PROZAC) 10 MG tablet Take 1 tablet (10 mg total) by mouth daily.  90 tablet  1   No current facility-administered medications on file prior to visit.    Current Medications (verified) Current Outpatient Prescriptions  Medication Sig Dispense Refill  . amiodarone (PACERONE) 200 MG tablet take 1 tablet by mouth once daily  90 tablet  3  . Cholecalciferol (VITAMIN D3) 2000 UNITS TABS Take 1 tablet by mouth daily.  100 tablet  3  . meclizine (ANTIVERT) 25 MG tablet Take 1 tablet (25 mg total) by mouth 3 (three) times daily as needed for dizziness.  30 tablet  2  . omeprazole (PRILOSEC) 40 MG capsule take 1 capsule by mouth once daily  90 capsule  3  . traMADol (ULTRAM) 50 MG tablet Take 1 tablet (50 mg total) by mouth every 8 (eight) hours as needed.  40 tablet  0  . warfarin (COUMADIN) 5 MG tablet Take 0.5-1 tablets (2.5-5 mg total) by mouth daily. Takes 5mg  Tuesday, Thursday and  Takes 2.5mg  all other days.  90 tablet  1  . FLUoxetine (PROZAC) 10 MG tablet Take 1 tablet (10 mg total) by mouth daily.  90 tablet  1   No current facility-administered medications for this visit.     Allergies (verified) Lipitor   PAST HISTORY  Family History Family History  Problem Relation Age of Onset  . Diabetes Father 83    diabetes and syncope  . Heart attack Father   . CAD    . Heart attack Sister   . Heart attack Brother     all 5 brothers had MIs  . Stroke Brother   . Colon cancer Neg Hx   . Cancer Sister     bone  . Cancer Sister   . Heart attack Brother     Social History History  Substance Use Topics  . Smoking status: Never Smoker   . Smokeless tobacco: Never Used  . Alcohol Use: No     Are there smokers in  your home (other than you)? No  Risk Factors Current exercise habits: The patient does not participate in regular exercise at present.  Dietary issues discussed: none   Cardiac risk factors: advanced age (older than 39 for men, 13 for women), dyslipidemia, microalbuminuria and sedentary lifestyle.  Depression Screen Patient has recently moved from home of [place] in [place] to [place].  She is not comfortable driving in this are yet so is not getting out much .  She has taken fluoxetine in past but is not taking currently - she does not want to restart.   (Note: if answer to either of the following is "Yes", a more complete depression screening is indicated)   Over the past 2 weeks, have you felt down, depressed or hopeless? Yes  Over the past 2 weeks, have you felt little interest or pleasure in doing things? Yes  Have you lost interest or pleasure in daily life? No  Do you often feel hopeless? No  Do you cry easily over simple problems? No  Activities of Daily Living In your present state of health, do you have any difficulty performing the following activities?:  Driving? No Managing money?  No Feeding yourself? No Getting from bed to chair? No  Climbing a flight of stairs? Yes - uses railing and cane - home is 1 level Preparing food and eating?: No Bathing or showering? No Getting dressed: No Getting to the toilet? No Using the toilet:No Moving around from place to place: No In the past year have you fallen or had a near fall?:Yes   Are you sexually active?  No  Do you have more than one partner?  No  Hearing Difficulties: No Do you often ask people to speak up or repeat themselves? No Do you experience ringing or noises in your ears? No Do you have difficulty understanding soft or whispered voices? No   Do you feel that you have a problem with memory? No  Do you often misplace items? No  Do you feel safe at home?  Yes  Cognitive Testing  Alert? Yes  Normal  Appearance?Yes  Oriented to person? Yes  Place? Yes   Time? Yes  Recall of three objects?  Yes  Can perform simple calculations? Yes  Displays appropriate judgment?Yes  Can read the correct time from a watch face?Yes   Advanced Directives have been discussed with the patient? Yes  List the Names of Other Physician/Practitioners you currently use: 1.  Cardiologist - Dr Harrington Challenger 2.  GI - Dr Zenovia Jarred  Indicate any recent Medical Services you may have received from other than [facility] providers in the past year (date  may be approximate).  Immunization History  Administered Date(s) Administered  . Influenza Split 04/03/2012  . Influenza Whole 04/30/2006, 04/17/2010, 04/25/2011  . Influenza,inj,Quad PF,36+ Mos 03/23/2013  . Pneumococcal Polysaccharide-23 02/02/2009  . Pneumococcal-Unspecified 05/01/2011  . Td 04/26/2010  . Zoster 04/26/2010    Screening Tests Health Maintenance  Topic Date Due  . Influenza Vaccine  01/22/2014  . Mammogram  04/07/2015  . Tetanus/tdap  04/26/2020  . Colonoscopy  10/01/2022  . Pneumococcal Polysaccharide Vaccine Age 57 And Over  Completed  . Zostavax  Completed   No DEXA in several years  All answers were reviewed with the patient and necessary referrals were made:  Cherre Robins, [facility]   01/05/2014   History reviewed: allergies, current medications, past family history, past medical history, past social history, past surgical history and problem list     Objective:      Body mass index is 25.68 kg/(m^2). BP 136/80  Pulse 70  Ht 5\' 9"  (1.753 m)  Wt 174 lb (78.926 kg)  BMI 25.68 kg/m2 INR was 3.3 today     Assessment:     Initial Medicare Annual Wellness Visit Slightly supratherapeutic anticoagulation Depression      Plan:     During the course of the visit the patient was educated and counseled about appropriate screening and preventive services including:    Pneumococcal vaccine   Influenza vaccine  Hepatitis B  vaccine  Td vaccine  Screening electrocardiogram  Screening mammography  Screening Pap smear and pelvic exam   Bone densitometry screening  Colorectal cancer screening  Diabetes screening  Glaucoma screening  Advanced directives: no advanced directives - packet given  patient encouraged to restart fluoxetine to help during transition to new home.   DEXA ordered today  Diet review for nutrition referral? Yes ____  Not Indicated _X_  Anticoagulation Dose Instructions as of 01/05/2014     Sun Mon Tue Wed Thu Fri Sat   New Dose 2.5 mg 2.5 mg 5 mg 2.5 mg 2.5 mg 2.5 mg 5 mg    Description       No warfarin / coumadin today Wednesday, July 15th.  Then decrease  Dose to 1/2 pill everyday except 1 pill on Tuesdays and Saturdays.       Patient Instructions (the written plan) was given to the patient.  Medicare Attestation I have personally reviewed: The patient's medical and social history Their use of alcohol, tobacco or illicit drugs Their current medications and supplements The patient's functional ability including ADLs,fall risks, home safety risks, cognitive, and hearing and visual impairment Diet and physical activities Evidence for depression or mood disorders  The patient's weight, height, BMI, and HR/BP have been recorded in the chart.  I have made referrals, counseling, and provided education to the patient based on review of the above and I have provided the patient with a written personalized care plan for preventive services.     Cherre Robins, [facility]   01/05/2014

## 2014-01-18 ENCOUNTER — Encounter: Payer: Self-pay | Admitting: Internal Medicine

## 2014-02-16 ENCOUNTER — Ambulatory Visit (INDEPENDENT_AMBULATORY_CARE_PROVIDER_SITE_OTHER): Payer: Medicare Other | Admitting: Pharmacist

## 2014-02-16 DIAGNOSIS — I4891 Unspecified atrial fibrillation: Secondary | ICD-10-CM

## 2014-02-16 DIAGNOSIS — Z5181 Encounter for therapeutic drug level monitoring: Secondary | ICD-10-CM | POA: Diagnosis not present

## 2014-02-16 LAB — POCT INR: INR: 2.2

## 2014-02-16 NOTE — Patient Instructions (Signed)
Anticoagulation Dose Instructions as of 02/16/2014     Virginia Young Tue Wed Thu Fri Sat   New Dose 2.5 mg 2.5 mg 5 mg 2.5 mg 2.5 mg 2.5 mg 5 mg    Description       Continue current warfarin dose of 1/2 pill everyday except 1 pill on Tuesdays and Saturdays.      INR was 2.2 today

## 2014-03-27 DIAGNOSIS — Z23 Encounter for immunization: Secondary | ICD-10-CM | POA: Diagnosis not present

## 2014-03-31 ENCOUNTER — Ambulatory Visit (INDEPENDENT_AMBULATORY_CARE_PROVIDER_SITE_OTHER): Payer: Medicare Other | Admitting: Pharmacist

## 2014-03-31 DIAGNOSIS — Z5181 Encounter for therapeutic drug level monitoring: Secondary | ICD-10-CM

## 2014-03-31 DIAGNOSIS — I4891 Unspecified atrial fibrillation: Secondary | ICD-10-CM

## 2014-03-31 LAB — POCT INR: INR: 3.5

## 2014-03-31 NOTE — Patient Instructions (Signed)
Anticoagulation Dose Instructions as of 03/31/2014     Virginia Young Tue Wed Thu Fri Sat   New Dose 2.5 mg 2.5 mg 5 mg 2.5 mg 2.5 mg 2.5 mg 2.5 mg    Description       No warfarin today - Thursday, October 8th, then decrease dose to  current warfarin dose of 1/2 tablet everyday except 1 tablet on Tuesdays.      INR was 3.5 today

## 2014-04-06 ENCOUNTER — Ambulatory Visit (INDEPENDENT_AMBULATORY_CARE_PROVIDER_SITE_OTHER): Payer: Medicare Other | Admitting: Pharmacist

## 2014-04-06 ENCOUNTER — Ambulatory Visit: Payer: Self-pay | Admitting: Family Medicine

## 2014-04-06 ENCOUNTER — Ambulatory Visit (INDEPENDENT_AMBULATORY_CARE_PROVIDER_SITE_OTHER): Payer: Medicare Other

## 2014-04-06 ENCOUNTER — Encounter: Payer: Self-pay | Admitting: Pharmacist

## 2014-04-06 VITALS — Ht 69.0 in | Wt 175.0 lb

## 2014-04-06 DIAGNOSIS — L539 Erythematous condition, unspecified: Secondary | ICD-10-CM | POA: Diagnosis not present

## 2014-04-06 DIAGNOSIS — Z13820 Encounter for screening for osteoporosis: Secondary | ICD-10-CM

## 2014-04-06 DIAGNOSIS — M8080XD Other osteoporosis with current pathological fracture, unspecified site, subsequent encounter for fracture with routine healing: Secondary | ICD-10-CM

## 2014-04-06 DIAGNOSIS — Z5181 Encounter for therapeutic drug level monitoring: Secondary | ICD-10-CM

## 2014-04-06 DIAGNOSIS — M8080XA Other osteoporosis with current pathological fracture, unspecified site, initial encounter for fracture: Secondary | ICD-10-CM | POA: Insufficient documentation

## 2014-04-06 DIAGNOSIS — I4891 Unspecified atrial fibrillation: Secondary | ICD-10-CM

## 2014-04-06 LAB — POCT INR: INR: 1.6

## 2014-04-06 LAB — POCT CBC
Granulocyte percent: 67.8 %G (ref 37–80)
HCT, POC: 42.1 % (ref 37.7–47.9)
Hemoglobin: 13.7 g/dL (ref 12.2–16.2)
Lymph, poc: 2.1 (ref 0.6–3.4)
MCH, POC: 28.2 pg (ref 27–31.2)
MCHC: 32.7 g/dL (ref 31.8–35.4)
MCV: 86.2 fL (ref 80–97)
MPV: 6.6 fL (ref 0–99.8)
POC Granulocyte: 5.6 (ref 2–6.9)
POC LYMPH PERCENT: 24.8 %L (ref 10–50)
Platelet Count, POC: 230 10*3/uL (ref 142–424)
RBC: 4.9 M/uL (ref 4.04–5.48)
RDW, POC: 14.7 %
WBC: 8.3 10*3/uL (ref 4.6–10.2)

## 2014-04-06 LAB — HM DEXA SCAN

## 2014-04-06 MED ORDER — ALENDRONATE SODIUM 70 MG PO TABS: 70.0000 mg | ORAL_TABLET | ORAL | Status: DC

## 2014-04-06 NOTE — Patient Instructions (Signed)
Anticoagulation Dose Instructions as of 04/06/2014     Dorene Grebe Tue Wed Thu Fri Sat   New Dose 2.5 mg 2.5 mg 5 mg 2.5 mg 5 mg 2.5 mg 2.5 mg    Description       Increase warfarin 5mg  dose to 1 tablet on Tuesdays and thursdays and 1/2 tablet all other days.      INR was 1.6 today    Fall Prevention and Home Safety Falls cause injuries and can affect all age groups. It is possible to use preventive measures to significantly decrease the likelihood of falls. There are many simple measures which can make your home safer and prevent falls. OUTDOORS  Repair cracks and edges of walkways and driveways.  Remove high doorway thresholds.  Trim shrubbery on the main path into your home.  Have good outside lighting.  Clear walkways of tools, rocks, debris, and clutter.  Check that handrails are not broken and are securely fastened. Both sides of steps should have handrails.  Have leaves, snow, and ice cleared regularly.  Use sand or salt on walkways during winter months.  In the garage, clean up grease or oil spills. BATHROOM  Install night lights.  Install grab bars by the toilet and in the tub and shower.  Use non-skid mats or decals in the tub or shower.  Place a plastic non-slip stool in the shower to sit on, if needed.  Keep floors dry and clean up all water on the floor immediately.  Remove soap buildup in the tub or shower on a regular basis.  Secure bath mats with non-slip, double-sided rug tape.  Remove throw rugs and tripping hazards from the floors. BEDROOMS  Install night lights.  Make sure a bedside light is easy to reach.  Do not use oversized bedding.  Keep a telephone by your bedside.  Have a firm chair with side arms to use for getting dressed.  Remove throw rugs and tripping hazards from the floor. KITCHEN  Keep handles on pots and pans turned toward the center of the stove. Use back burners when possible.  Clean up spills quickly and allow  time for drying.  Avoid walking on wet floors.  Avoid hot utensils and knives.  Position shelves so they are not too high or low.  Place commonly used objects within easy reach.  If necessary, use a sturdy step stool with a grab bar when reaching.  Keep electrical cables out of the way.  Do not use floor polish or wax that makes floors slippery. If you must use wax, use non-skid floor wax.  Remove throw rugs and tripping hazards from the floor. STAIRWAYS  Never leave objects on stairs.  Place handrails on both sides of stairways and use them. Fix any loose handrails. Make sure handrails on both sides of the stairways are as long as the stairs.  Check carpeting to make sure it is firmly attached along stairs. Make repairs to worn or loose carpet promptly.  Avoid placing throw rugs at the top or bottom of stairways, or properly secure the rug with carpet tape to prevent slippage. Get rid of throw rugs, if possible.  Have an electrician put in a light switch at the top and bottom of the stairs. OTHER FALL PREVENTION TIPS  Wear low-heel or rubber-soled shoes that are supportive and fit well. Wear closed toe shoes.  When using a stepladder, make sure it is fully opened and both spreaders are firmly locked. Do not climb a  closed stepladder.  Add color or contrast paint or tape to grab bars and handrails in your home. Place contrasting color strips on first and last steps.  Learn and use mobility aids as needed. Install an electrical emergency response system.  Turn on lights to avoid dark areas. Replace light bulbs that burn out immediately. Get light switches that glow.  Arrange furniture to create clear pathways. Keep furniture in the same place.  Firmly attach carpet with non-skid or double-sided tape.  Eliminate uneven floor surfaces.  Select a carpet pattern that does not visually hide the edge of steps.  Be aware of all pets. OTHER HOME SAFETY TIPS  Set the water  temperature for 120 F (48.8 C).  Keep emergency numbers on or near the telephone.  Keep smoke detectors on every level of the home and near sleeping areas. Document Released: 05/31/2002 Document Revised: 12/10/2011 Document Reviewed: 08/30/2011 [facility] Patient Information 2015 [place], [place]. This information is not intended to replace advice given to you by your health care provider. Make sure you discuss any questions you have with your health care provider.

## 2014-04-06 NOTE — Progress Notes (Signed)
Patient ID: Virginia Young, female   DOB: 1940/11/17, 73 y.o.   MRN: [id number]  [facility] Current Height: Height: 5' 9"  (175.3 cm)      Max Lifetime Height:  5' 11"  Current Weight: Weight: 175 lb (79.379 kg)       Ethnicity:Caucasian       HPI: Does pt already have a diagnosis of:  Osteopenia?  No Osteoporosis?  No  Patient also asks  Virginia Young to look at her left foot which is erythematous.  She reports that it has been red and slightly painful for the last week.   Back Pain?  No       Kyphosis?  No Prior fracture?  Yes - wrist about 1-2 years ago Med(s) for Osteoporosis/Osteopenia:  one Med(s) previously tried for Osteoporosis/Osteopenia:  none                                                             PMH: Age at menopause:  65's Hysterectomy?  No Oophorectomy?  No HRT? No Steroid Use?  No Thyroid med?  No History of cancer?  No History of digestive disorders (ie Crohn's)?  Yes - on chronic PPI for GERD Current or previous eating disorders?  No Last Vitamin D Result:  49 (05/2013) Last GFR Result:  60 (07/23/2013)   FH/SH: Family history of osteoporosis?  No Parent with history of hip fracture?  Yes - mother Family history of breast cancer?  No Exercise?  Yes - a little at home / walking Smoking?  No Alcohol?  No    Calcium Assessment Calcium Intake  # of servings/day  Calcium mg  Milk (8 oz) 1  x  300  = 354m  Yogurt (4 oz) 0 x  200 = 0  Cheese (1 oz) 1 x  200 = 2011m Other Calcium sources   25028mCa supplement 0 = 0   Estimated calcium intake per day 750m84m DEXA Results Date of Test T-Score for AP Spine L1-L4 T-Score for Total Left Hip T-Score for Total Right Hip  04/06/2104 -1.0 -2.5 -3.1                  INR = 1.6 today  Assessment: Osteoporosis Foot pain / erythema Subtherapetic anticoagulation  Recommendations: 1.  Start  alendronate (FOSAMAX) 70mg60mablet weekly - reviewed proper administration 2.  recommend calcium 1200mg 65mly through supplementation or diet.  3.  recommend weight bearing exercise - 30 minutes at least 4 days per week.   4.  Counseled and educated about fall risk and prevention. 5.  Foot pain / erythema discussed with triage nurse - no appt's available right now.  Offered patient appt to RTC this evening at 5:30pm to have foot evaluated but she was afraid she would have to drive in dark.  Appt given for tomorrow at 11am. 6.   Orders Placed This Encounter  Procedures  . HM DEXA SCAN    This external order was created through the Results Console.  . BMP8Marland KitchenEGFR  . Uric acid  . POCT CBC    7.   Anticoagulation Dose Instructions as of 04/06/2014     Sun MoDorene Grebeed Thu Fri Sat   New Dose 2.5 mg 2.5 mg 5 mg  2.5 mg 5 mg 2.5 mg 2.5 mg    Description       Increase warfarin 58m dose to 1 tablet on Tuesdays and thursdays and 1/2 tablet all other days.     RTC in 2 weeks to recheck INR  Recheck DEXA:  2 years  Time spent counseling patient:  40 minutes  TCherre Robins PharmD, CPP

## 2014-04-07 ENCOUNTER — Encounter: Payer: Self-pay | Admitting: Family Medicine

## 2014-04-07 ENCOUNTER — Ambulatory Visit (INDEPENDENT_AMBULATORY_CARE_PROVIDER_SITE_OTHER): Payer: Medicare Other

## 2014-04-07 ENCOUNTER — Ambulatory Visit (INDEPENDENT_AMBULATORY_CARE_PROVIDER_SITE_OTHER): Payer: Medicare Other | Admitting: Family Medicine

## 2014-04-07 VITALS — BP 121/79 | HR 78 | Temp 97.7°F | Ht 69.0 in | Wt 174.2 lb

## 2014-04-07 DIAGNOSIS — M79605 Pain in left leg: Secondary | ICD-10-CM | POA: Diagnosis not present

## 2014-04-07 LAB — BMP8+EGFR
BUN/Creatinine Ratio: 14 (ref 11–26)
BUN: 17 mg/dL (ref 8–27)
CO2: 21 mmol/L (ref 18–29)
Calcium: 9.1 mg/dL (ref 8.7–10.3)
Chloride: 101 mmol/L (ref 97–108)
Creatinine, Ser: 1.22 mg/dL — ABNORMAL HIGH (ref 0.57–1.00)
GFR calc Af Amer: 50 mL/min/{1.73_m2} — ABNORMAL LOW (ref >59)
GFR calc non Af Amer: 44 mL/min/{1.73_m2} — ABNORMAL LOW (ref >59)
Glucose: 81 mg/dL (ref 65–99)
Potassium: 4.8 mmol/L (ref 3.5–5.2)
Sodium: 142 mmol/L (ref 134–144)

## 2014-04-07 LAB — URIC ACID: Uric Acid: 3.3 mg/dL (ref 2.5–7.1)

## 2014-04-07 MED ORDER — TRAMADOL HCL 50 MG PO TABS: 50.0000 mg | ORAL_TABLET | Freq: Three times a day (TID) | ORAL | Status: DC | PRN

## 2014-04-07 NOTE — Progress Notes (Signed)
   Subjective:    Patient ID: Virginia Young, female    DOB: 26-Jul-1940, 73 y.o.   MRN: [id number]  HPI  C/o left pain for a few days.  She denies any injury.  Review of Systems    No chest pain, SOB, HA, dizziness, vision change, N/V, diarrhea, constipation, dysuria, urinary urgency or frequency, myalgias, arthralgias or rash.  Objective:   Physical Exam  TTP left anterior foot proximal lateral metarsals.  No deformities.     Xray left foot - No fracture   Prelimnary reading by Virginia Young:  Pain of left lower extremity - Plan: traMADol (ULTRAM) 50 MG tablet, DG Foot Complete Left Virginia Penner FNP

## 2014-04-08 ENCOUNTER — Telehealth: Payer: Self-pay | Admitting: Pharmacist

## 2014-04-08 NOTE — Telephone Encounter (Signed)
Patient called about lab results from 04/06/14.  Uric acid - WNL.  Serum creatinine slightly elevated but stable.  Recheck in 1-2 months. All other labs WNL.

## 2014-04-20 ENCOUNTER — Ambulatory Visit (INDEPENDENT_AMBULATORY_CARE_PROVIDER_SITE_OTHER): Payer: Medicare Other | Admitting: Pharmacist

## 2014-04-20 ENCOUNTER — Encounter: Payer: Self-pay | Admitting: Cardiology

## 2014-04-20 ENCOUNTER — Ambulatory Visit (INDEPENDENT_AMBULATORY_CARE_PROVIDER_SITE_OTHER): Payer: Medicare Other | Admitting: Cardiology

## 2014-04-20 VITALS — BP 110/70 | HR 59 | Ht 70.0 in | Wt 181.0 lb

## 2014-04-20 DIAGNOSIS — I481 Persistent atrial fibrillation: Secondary | ICD-10-CM

## 2014-04-20 DIAGNOSIS — R002 Palpitations: Secondary | ICD-10-CM

## 2014-04-20 DIAGNOSIS — Z5181 Encounter for therapeutic drug level monitoring: Secondary | ICD-10-CM | POA: Diagnosis not present

## 2014-04-20 DIAGNOSIS — I4891 Unspecified atrial fibrillation: Secondary | ICD-10-CM | POA: Diagnosis not present

## 2014-04-20 DIAGNOSIS — I4819 Other persistent atrial fibrillation: Secondary | ICD-10-CM

## 2014-04-20 LAB — POCT INR: INR: 2.2

## 2014-04-20 NOTE — Addendum Note (Signed)
Addended by: Selmer Dominion on: 04/20/2014 04:08 PM   Modules accepted: Orders

## 2014-04-20 NOTE — Patient Instructions (Signed)
Anticoagulation Dose Instructions as of 04/20/2014     Virginia Young Tue Wed Thu Fri Sat   New Dose 2.5 mg 2.5 mg 5 mg 2.5 mg 5 mg 2.5 mg 2.5 mg    Description       Continue warfarin 5mg  dose to 1 tablet on Tuesdays and thursdays and 1/2 tablet all other days.      INR was 2.2 today

## 2014-04-20 NOTE — Progress Notes (Signed)
Anticoagulation visit  INR was 2.2 - therapeutic   No change in warfarin dose  Anticoagulation Dose Instructions as of 04/20/2014     Dorene Grebe Tue Wed Thu Fri Sat   New Dose 2.5 mg 2.5 mg 5 mg 2.5 mg 5 mg 2.5 mg 2.5 mg    Description       Continue warfarin 5mg  dose to 1 tablet on Tuesdays and thursdays and 1/2 tablet all other days.      Cherre Robins, PharmD, CPP

## 2014-04-20 NOTE — Patient Instructions (Signed)
The current medical regimen is effective;  continue present plan and medications.  Please have blood work today at [facility]  (TSH and Hepatic panel)  Follow up in 6 months with Dr. Percival Spanish in [place].  You will receive a letter in the mail 2 months before you are due.  Please call us when you receive this letter to schedule your follow up appointment.

## 2014-04-20 NOTE — Progress Notes (Signed)
HPI The patient presents for evaluation of atrial fibrillation and nonischemic cardiomyopathy. She has moved here and so is switching her care. I did review previous records. She has a mildly reduced ejection fraction of 40% followed up on echo last year. She has had fibrillation but has seemed to maintain sinus rhythm on amiodarone. She does have chronic dyspnea with mild exertion. She's not describing PND or orthopnea. She's not describing palpitations, presyncope or syncope. She has no chest pressure, neck or arm discomfort. While she does describe dyspnea with exertion she is able to ambulate and do some walking and her daughter reports she is much better now she was earlier in the year.  Allergies  Allergen Reactions  . Lipitor [Atorvastatin Calcium]     myalgia    Current Outpatient Prescriptions  Medication Sig Dispense Refill  . alendronate (FOSAMAX) 70 MG tablet Take 1 tablet (70 mg total) by mouth once a week. Take with a full glass of water on an empty stomach.  12 tablet  2  . amiodarone (PACERONE) 200 MG tablet take 1 tablet by mouth once daily  90 tablet  3  . Cholecalciferol (VITAMIN D3) 2000 UNITS TABS Take 1 tablet by mouth daily.  100 tablet  3  . meclizine (ANTIVERT) 25 MG tablet Take 1 tablet (25 mg total) by mouth 3 (three) times daily as needed for dizziness.  30 tablet  2  . omeprazole (PRILOSEC) 40 MG capsule take 1 capsule by mouth once daily  90 capsule  3  . traMADol (ULTRAM) 50 MG tablet Take 1 tablet (50 mg total) by mouth every 8 (eight) hours as needed.  40 tablet  0  . warfarin (COUMADIN) 5 MG tablet Take 0.5-1 tablets (2.5-5 mg total) by mouth daily. Takes 5mg  Tuesday, Thursday and  Takes 2.5mg  all other days.  90 tablet  1   No current facility-administered medications for this visit.    Past Medical History  Diagnosis Date  . NICM (nonischemic cardiomyopathy)     a. neg CLite in 2003;  b. EF 40-45% in past;   c.  Echo 12/11: EF 35-40%, mild MR, mild  LAE, mild RAE, small pericardial effusion   . Mild mitral regurgitation   . HLD (hyperlipidemia)   . Chronic systolic heart failure   . Atrial fibrillation     a. coumadin;  b. Amiodarone  . Stasis ulcer   . Absence of menstruation   . Family history of ischemic heart disease   . Varicose veins   . Depression   . Osteoarthritis   . Chest pain 03/2012    a. Lex MV 10/13:  EF 64%, dist ant and apical defect sugg of soft tissue atten, no ischemia  . Cataract   . GERD (gastroesophageal reflux disease)   . Osteoporosis     Past Surgical History  Procedure Laterality Date  . Lipoma excision      h/o removed from upper back x 2  . Inguinal hernia repair      right  . Cataract surgery Bilateral     ROS:  As stated in the HPI and negative for all other systems.  PHYSICAL EXAM BP 110/70  Pulse 59  Ht 5\' 10"  (1.778 m)  Wt 181 lb (82.101 kg)  BMI 25.97 kg/m2 GENERAL:  Well appearing HEENT:  Pupils equal round and reactive, fundi not visualized, oral mucosa unremarkable NECK:  No jugular venous distention, waveform within normal limits, carotid upstroke brisk and symmetric, no bruits,  no thyromegaly LYMPHATICS:  No cervical, inguinal adenopathy LUNGS:  Clear to auscultation bilaterally BACK:  No CVA tenderness CHEST:  Unremarkable HEART:  PMI not displaced or sustained,S1 and S2 within normal limits, no S3, no S4, no clicks, no rubs, no murmurs ABD:  Flat, positive bowel sounds normal in frequency in pitch, no bruits, no rebound, no guarding, no midline pulsatile mass, no hepatomegaly, no splenomegaly EXT:  2 plus pulses throughout, no edema, no cyanosis no clubbing SKIN:  No rashes no nodules, varicose veins NEURO:  Cranial nerves II through XII grossly intact, motor grossly intact throughout PSYCH:  Cognitively intact, oriented to person place and time  EKG:  Sinus rhythm, rate 59, left axis deviation, no acute ST-T wave changes.  ASSESSMENT AND PLAN  DYSPNEA:  This has  been chronic. A BNP done earlier this year was only minimally elevated. She does not have evidence of volume overload. I do not think this is related to her cardiomyopathy. I don't think change in therapy is indicated particularly as her symptoms have improved by her family's observation. I don't think further imaging is indicated.  CARDIOMYOPATHY:  As above  ATRIAL FIB:  He seems to be maintaining sinus rhythm. She has not had thyroid profile or liver enzymes in our system since last December and so I will order these.  She tolerates warfarin and is comfortable with this and does not want to switch to a NOAC.  PERICARDIAL EFFUSION:  I removed this from her problem list as this was not noted on the most recent echo.      n

## 2014-04-21 LAB — TSH: TSH: 2.43 u[IU]/mL (ref 0.450–4.500)

## 2014-05-10 ENCOUNTER — Other Ambulatory Visit: Payer: Self-pay | Admitting: Family Medicine

## 2014-05-12 ENCOUNTER — Ambulatory Visit (INDEPENDENT_AMBULATORY_CARE_PROVIDER_SITE_OTHER): Payer: Medicare Other | Admitting: Pharmacist

## 2014-05-12 DIAGNOSIS — Z5181 Encounter for therapeutic drug level monitoring: Secondary | ICD-10-CM | POA: Diagnosis not present

## 2014-05-12 DIAGNOSIS — I4891 Unspecified atrial fibrillation: Secondary | ICD-10-CM | POA: Diagnosis not present

## 2014-05-12 LAB — POCT INR: INR: 2.4

## 2014-05-12 NOTE — Patient Instructions (Signed)
Anticoagulation Dose Instructions as of 05/12/2014      Dorene Grebe Tue Wed Thu Fri Sat   New Dose 2.5 mg 2.5 mg 5 mg 2.5 mg 5 mg 2.5 mg 2.5 mg    Description        Continue warfarin 5mg  dose to 1 tablet on Tuesdays and thursdays and 1/2 tablet all other days.     INR was 2.4 today

## 2014-06-01 ENCOUNTER — Other Ambulatory Visit: Payer: Self-pay | Admitting: Internal Medicine

## 2014-06-01 ENCOUNTER — Other Ambulatory Visit: Payer: Self-pay | Admitting: Family Medicine

## 2014-06-22 ENCOUNTER — Other Ambulatory Visit: Payer: Self-pay | Admitting: Internal Medicine

## 2014-06-24 HISTORY — PX: ANTERIOR AND POSTERIOR VAGINAL REPAIR W/ SACROSPINOUS LIGAMENT SUSPENSION: SUR6

## 2014-06-30 ENCOUNTER — Ambulatory Visit (INDEPENDENT_AMBULATORY_CARE_PROVIDER_SITE_OTHER): Payer: Medicare Other | Admitting: Pharmacist

## 2014-06-30 DIAGNOSIS — I4891 Unspecified atrial fibrillation: Secondary | ICD-10-CM | POA: Diagnosis not present

## 2014-06-30 DIAGNOSIS — Z5181 Encounter for therapeutic drug level monitoring: Secondary | ICD-10-CM | POA: Diagnosis not present

## 2014-06-30 LAB — POCT INR: INR: 1.8

## 2014-06-30 NOTE — Patient Instructions (Signed)
Anticoagulation Dose Instructions as of 06/30/2014      Dorene Grebe Tue Wed Thu Fri Sat   New Dose 2.5 mg 2.5 mg 5 mg 2.5 mg 5 mg 2.5 mg 2.5 mg    Description        Take 1 and 1/2 tablets today only - Thursday, January 7th.  Then continue warfarin 5mg  dose to 1 tablet on Tuesdays and thursdays and 1/2 tablet all other days.      INR was 1.8 today (goal is 2.0 to 3.0 - just a little thick today)

## 2014-08-01 ENCOUNTER — Ambulatory Visit (INDEPENDENT_AMBULATORY_CARE_PROVIDER_SITE_OTHER): Payer: Medicare Other | Admitting: Pharmacist

## 2014-08-01 DIAGNOSIS — I4891 Unspecified atrial fibrillation: Secondary | ICD-10-CM | POA: Diagnosis not present

## 2014-08-01 DIAGNOSIS — Z5181 Encounter for therapeutic drug level monitoring: Secondary | ICD-10-CM

## 2014-08-01 DIAGNOSIS — N8110 Cystocele, unspecified: Secondary | ICD-10-CM | POA: Diagnosis not present

## 2014-08-01 LAB — POCT INR: INR: 2.2

## 2014-08-01 NOTE — Progress Notes (Signed)
Patient reports over last 2 months that she feels that her bladder has dropped.  Discussed with Dr Laurance Flatten. Referred to Dr Arnold/GYN for evaluation.

## 2014-08-01 NOTE — Patient Instructions (Signed)
Anticoagulation Dose Instructions as of 08/01/2014      Dorene Grebe Tue Wed Thu Fri Sat   New Dose 2.5 mg 2.5 mg 5 mg 2.5 mg 5 mg 2.5 mg 2.5 mg    Description        Continue warfarin 5mg  dose to 1 tablet on Tuesdays and thursdays and 1/2 tablet all other days.      INR was 2.2 today

## 2014-08-05 ENCOUNTER — Other Ambulatory Visit: Payer: Self-pay | Admitting: Family Medicine

## 2014-08-25 ENCOUNTER — Other Ambulatory Visit: Payer: Self-pay | Admitting: Family Medicine

## 2014-09-05 ENCOUNTER — Encounter: Payer: Self-pay | Admitting: Obstetrics & Gynecology

## 2014-09-05 ENCOUNTER — Other Ambulatory Visit (HOSPITAL_COMMUNITY)
Admission: RE | Admit: 2014-09-05 | Discharge: 2014-09-05 | Disposition: A | Discharge status: Home / Self Care | Payer: Medicare Other | Source: Ambulatory Visit | Attending: Obstetrics & Gynecology | Admitting: Obstetrics & Gynecology

## 2014-09-05 ENCOUNTER — Ambulatory Visit (INDEPENDENT_AMBULATORY_CARE_PROVIDER_SITE_OTHER): Payer: Medicare Other | Admitting: Pharmacist

## 2014-09-05 ENCOUNTER — Ambulatory Visit (INDEPENDENT_AMBULATORY_CARE_PROVIDER_SITE_OTHER): Payer: Medicare Other | Admitting: Obstetrics & Gynecology

## 2014-09-05 VITALS — BP 133/86 | HR 72 | Resp 16 | Ht 70.0 in | Wt 182.0 lb

## 2014-09-05 DIAGNOSIS — I4891 Unspecified atrial fibrillation: Secondary | ICD-10-CM | POA: Diagnosis not present

## 2014-09-05 DIAGNOSIS — Z5181 Encounter for therapeutic drug level monitoring: Secondary | ICD-10-CM | POA: Diagnosis not present

## 2014-09-05 DIAGNOSIS — N813 Complete uterovaginal prolapse: Secondary | ICD-10-CM | POA: Diagnosis not present

## 2014-09-05 DIAGNOSIS — Z1151 Encounter for screening for human papillomavirus (HPV): Secondary | ICD-10-CM | POA: Insufficient documentation

## 2014-09-05 DIAGNOSIS — Z124 Encounter for screening for malignant neoplasm of cervix: Secondary | ICD-10-CM | POA: Insufficient documentation

## 2014-09-05 DIAGNOSIS — Z01419 Encounter for gynecological examination (general) (routine) without abnormal findings: Secondary | ICD-10-CM

## 2014-09-05 DIAGNOSIS — N3946 Mixed incontinence: Secondary | ICD-10-CM | POA: Diagnosis not present

## 2014-09-05 LAB — POCT INR: INR: 2.6

## 2014-09-05 NOTE — Patient Instructions (Signed)
Anticoagulation Dose Instructions as of 09/05/2014      Virginia Young Tue Wed Thu Fri Sat   New Dose 2.5 mg 2.5 mg 5 mg 2.5 mg 5 mg 2.5 mg 2.5 mg    Description        Continue warfarin 5mg  dose to 1 tablet on Tuesdays and thursdays and 1/2 tablet all other days.     INR was 2.6 today

## 2014-09-05 NOTE — Patient Instructions (Signed)
About Cystocele  Overview  The pelvic organs, including the bladder, are normally supported by pelvic floor muscles and ligaments.  When these muscles and ligaments are stretched, weakened or torn, the wall between the bladder and the vagina sags or herniates causing a prolapse, sometimes called a cystocele.  This condition may cause discomfort and problems with emptying the bladder.  It can be present in various stages.  Some people are not aware of the changes.  Others may notice changes at the vaginal opening or a feeling of the bladder dropping outside the body.  Causes of a Cystocele  A cystocele is usually caused by muscle straining or stretching during childbirth.  In addition, cystocele is more common after menopause, because the hormone estrogen helps keep the elastic tissues around the pelvic organs strong.  A cystocele is more likely to occur when levels of estrogen decrease.  Other causes include: heavy lifting, chronic coughing, previous pelvic surgery and obesity.  Symptoms  A bladder that has dropped from its normal position may cause: unwanted urine leakage (stress incontinence), frequent urination or urge to urinate, incomplete emptying of the bladder (not feeling bladder relief after emptying), pain or discomfort in the vagina, pelvis, groin, lower back or lower abdomen and frequent urinary tract infections.  Mild cases may not cause any symptoms.  Treatment Options  Pelvic floor (Kegel) exercises:  Strength training the muscles in your genital area  Behavioral changes: Treating and preventing constipation, taking time to empty your bladder properly, learning to lift properly and/or avoid heavy lifting when possible, stopping smoking, avoiding weight gain and treating a chronic cough or bronchitis.  A pessary: A vaginal support device is sometimes used to help pelvic support caused by muscle and ligament changes.  Surgery: Surgical repair may be necessary if symptoms cannot  be managed with exercise, behavioral changes and a pessary.  Surgery is usually considered for severe cases.   2007, [employer] 

## 2014-09-05 NOTE — Progress Notes (Signed)
Subjective:     Patient ID: Virginia Young, female   DOB: 01/12/1941, 74 y.o.   MRN: [id number]  HPI Pt presents with complaint of 'hen egg sized' lesion coming from vagina.  She reports that she is always leaking urine and changes a large pad ~8 times per day.  She reports that she had a pessary which was removed due to freq UTI's.  She reports that even with the pessary she had incontinence.  She is not interested in trying a pessary again.    She does find the bulge and the incontinence bothersome.  She reports that ~ 1year ago she noted spotting.  Last week she noted some pink on her underwear but did not think that she was bleeding.  Past Medical History  Diagnosis Date  . NICM (nonischemic cardiomyopathy)     a. neg CLite in 2003;  b. EF 40-45% in past;   c.  Echo 12/11: EF 35-40%, mild MR, mild LAE, mild RAE, small pericardial effusion   . Mild mitral regurgitation   . HLD (hyperlipidemia)   . Chronic systolic heart failure   . Atrial fibrillation     a. coumadin;  b. Amiodarone  . Stasis ulcer   . Varicose veins   . Depression   . Osteoarthritis   . Chest pain 03/2012    a. Lex MV 10/13:  EF 64%, dist ant and apical defect sugg of soft tissue atten, no ischemia  . Cataract   . GERD (gastroesophageal reflux disease)   . Osteoporosis   . MS (multiple sclerosis)    Past Surgical History  Procedure Laterality Date  . Lipoma excision      h/o removed from upper back x 2  . Inguinal hernia repair      right  . Cataract surgery Bilateral     Current Outpatient Prescriptions on File Prior to Visit  Medication Sig Dispense Refill  . alendronate (FOSAMAX) 70 MG tablet Take 1 tablet (70 mg total) by mouth once a week. Take with a full glass of water on an empty stomach. 12 tablet 2  . amiodarone (PACERONE) 200 MG tablet TAKE 1 TABLET BY MOUTH EVERY DAY 90 tablet 0  . Cholecalciferol (VITAMIN D3) 2000 UNITS TABS Take 1 tablet by mouth daily. 100 tablet 3  . meclizine (ANTIVERT)  25 MG tablet Take 1 tablet (25 mg total) by mouth 3 (three) times daily as needed for dizziness. 30 tablet 2  . omeprazole (PRILOSEC) 40 MG capsule TAKE 1 CAPSULE BY MOUTH DAILY 90 capsule 0  . traMADol (ULTRAM) 50 MG tablet Take 1 tablet (50 mg total) by mouth every 8 (eight) hours as needed. 40 tablet 0  . warfarin (COUMADIN) 5 MG tablet TAKE 1/2 TO 1 TABLET DAILY AS DIRECTED 90 tablet 0   No current facility-administered medications on file prior to visit.    Allergies  Allergen Reactions  . Lipitor [Atorvastatin Calcium]     myalgia     Review of Systems     Objective:   Physical Exam BP 133/86 mmHg  Pulse 72  Resp 16  Ht 5\' 10"  (1.778 m)  Wt 182 lb (82.555 kg)  BMI 26.11 kg/m2 Pt in NAD GU: EGBUS: no lesions Vagina: no blood in vault; Grade III cystocele.  Cervix: no lesion; no mucopurulent ; PAP obtained Uterus: small, mobile.   Adnexa: no masses; non tender         Assessment:   Mixed incontinence Cystocele- grade III Possible  uterine bleeding- unsure with history.  Will obtain pelvic sono.  If endometrial stripe thickened will rec Endobx       Plan:   PAP done today Referral To Dr. [place] Pink for eval/mnagement of incontinence Pelvic sono to check endometrial stripe

## 2014-09-07 ENCOUNTER — Other Ambulatory Visit: Payer: Self-pay | Admitting: Obstetrics & Gynecology

## 2014-09-07 ENCOUNTER — Ambulatory Visit (HOSPITAL_COMMUNITY)
Admission: RE | Admit: 2014-09-07 | Discharge: 2014-09-07 | Disposition: A | Discharge status: Home / Self Care | Payer: Medicare Other | Source: Ambulatory Visit | Attending: Obstetrics & Gynecology | Admitting: Obstetrics & Gynecology

## 2014-09-07 DIAGNOSIS — R8290 Unspecified abnormal findings in urine: Secondary | ICD-10-CM | POA: Diagnosis not present

## 2014-09-07 DIAGNOSIS — N390 Urinary tract infection, site not specified: Secondary | ICD-10-CM | POA: Diagnosis not present

## 2014-09-07 DIAGNOSIS — N950 Postmenopausal bleeding: Secondary | ICD-10-CM | POA: Diagnosis not present

## 2014-09-07 DIAGNOSIS — N898 Other specified noninflammatory disorders of vagina: Secondary | ICD-10-CM | POA: Diagnosis not present

## 2014-09-07 DIAGNOSIS — N8110 Cystocele, unspecified: Secondary | ICD-10-CM | POA: Diagnosis not present

## 2014-09-07 DIAGNOSIS — N854 Malposition of uterus: Secondary | ICD-10-CM | POA: Diagnosis not present

## 2014-09-07 DIAGNOSIS — Z01419 Encounter for gynecological examination (general) (routine) without abnormal findings: Secondary | ICD-10-CM

## 2014-09-07 DIAGNOSIS — R339 Retention of urine, unspecified: Secondary | ICD-10-CM | POA: Diagnosis not present

## 2014-09-07 DIAGNOSIS — D259 Leiomyoma of uterus, unspecified: Secondary | ICD-10-CM | POA: Diagnosis not present

## 2014-09-07 DIAGNOSIS — N3946 Mixed incontinence: Secondary | ICD-10-CM | POA: Diagnosis not present

## 2014-09-07 NOTE — Addendum Note (Signed)
Addended by: Gretchen Short on: 09/07/2014 09:02 AM   Modules accepted: Orders

## 2014-09-08 LAB — CYTOLOGY - PAP

## 2014-09-12 ENCOUNTER — Telehealth: Payer: Self-pay | Admitting: *Deleted

## 2014-09-12 NOTE — Telephone Encounter (Signed)
-----   Message from Mallie Darting sent at 09/12/2014  2:52 PM EDT ----- Regarding: RE: Schedule and endometrial bx appt. Appointment is 04/21 @1 :45  ----- Message -----    From: Sue Lush, RN    Sent: 09/12/2014  10:25 AM      To: [facility] Admin Pool Subject: Schedule and endometrial bx appt.              Ladies,  Can you please schedule this patient an appointment for endometrial bx, resend back to clinical pool and we will contact patient. ----- Message -----    From: Lavonia Drafts, MD    Sent: 09/12/2014   9:43 AM      To: [facility] Clinical Pool  Please call pt.  She needs a f/u visit for an endometrial bx for PMPB as her endometrium could not be measured by sono.  Any provider.  Thx, clh-S

## 2014-09-12 NOTE — Telephone Encounter (Signed)
Contacted patient to inform of Dr. Ihor Dow recommendation.  Pt wanted nurse to contact her Daughter, [name]. Contacted Pam to discuss recommendations. Pam is confused because her Mother has an appointment in April with urologist.  She will call the clinic back after she speaks with the other surgeon.

## 2014-09-21 ENCOUNTER — Encounter: Payer: Self-pay | Admitting: *Deleted

## 2014-10-07 ENCOUNTER — Telehealth: Payer: Self-pay | Admitting: Pharmacist

## 2014-10-07 DIAGNOSIS — N8110 Cystocele, unspecified: Secondary | ICD-10-CM | POA: Diagnosis not present

## 2014-10-07 DIAGNOSIS — Z01818 Encounter for other preprocedural examination: Secondary | ICD-10-CM | POA: Diagnosis not present

## 2014-10-07 DIAGNOSIS — Z01810 Encounter for preprocedural cardiovascular examination: Secondary | ICD-10-CM | POA: Diagnosis not present

## 2014-10-07 DIAGNOSIS — N398 Other specified disorders of urinary system: Secondary | ICD-10-CM | POA: Insufficient documentation

## 2014-10-07 DIAGNOSIS — N3946 Mixed incontinence: Secondary | ICD-10-CM | POA: Diagnosis not present

## 2014-10-07 DIAGNOSIS — R001 Bradycardia, unspecified: Secondary | ICD-10-CM | POA: Diagnosis not present

## 2014-10-07 DIAGNOSIS — N393 Stress incontinence (female) (male): Secondary | ICD-10-CM | POA: Diagnosis not present

## 2014-10-07 NOTE — Telephone Encounter (Signed)
Spoke with patient's daughter and reviewed chart.  I did find a progress note and request from Dr Zigmund Daniel that was scanned in to media records with a recommendation to change from warfarin to Xarelto so that Lovenox bridging would not be necessary.   Patient's daughter would like to confirm this with Dr Zigmund Daniel that this is still the plan.  Patient has appt 10/17/14 with clinical pharmacist.  Can make this transition at that time if still recommended.

## 2014-10-13 ENCOUNTER — Other Ambulatory Visit: Payer: Medicare Other | Admitting: Obstetrics & Gynecology

## 2014-10-17 ENCOUNTER — Ambulatory Visit (INDEPENDENT_AMBULATORY_CARE_PROVIDER_SITE_OTHER): Payer: Medicare Other | Admitting: Pharmacist

## 2014-10-17 DIAGNOSIS — Z5181 Encounter for therapeutic drug level monitoring: Secondary | ICD-10-CM

## 2014-10-17 DIAGNOSIS — I4891 Unspecified atrial fibrillation: Secondary | ICD-10-CM

## 2014-10-17 LAB — POCT INR: INR: 2.8

## 2014-10-17 NOTE — Patient Instructions (Signed)
Anticoagulation Dose Instructions as of 10/17/2014      Virginia Young Tue Wed Thu Fri Sat   New Dose 2.5 mg 2.5 mg 5 mg 2.5 mg 5 mg 2.5 mg 2.5 mg    Description        Continue warfarin 5mg  dose to 1 tablet on Tuesdays and thursdays and 1/2 tablet all other days.

## 2014-11-02 DIAGNOSIS — N393 Stress incontinence (female) (male): Secondary | ICD-10-CM | POA: Diagnosis not present

## 2014-11-02 DIAGNOSIS — N390 Urinary tract infection, site not specified: Secondary | ICD-10-CM | POA: Diagnosis present

## 2014-11-02 DIAGNOSIS — N814 Uterovaginal prolapse, unspecified: Secondary | ICD-10-CM | POA: Diagnosis present

## 2014-11-02 DIAGNOSIS — Z7901 Long term (current) use of anticoagulants: Secondary | ICD-10-CM | POA: Diagnosis not present

## 2014-11-02 DIAGNOSIS — N993 Prolapse of vaginal vault after hysterectomy: Secondary | ICD-10-CM | POA: Diagnosis not present

## 2014-11-02 DIAGNOSIS — N3090 Cystitis, unspecified without hematuria: Secondary | ICD-10-CM | POA: Diagnosis not present

## 2014-11-02 DIAGNOSIS — N816 Rectocele: Secondary | ICD-10-CM | POA: Diagnosis present

## 2014-11-02 DIAGNOSIS — I480 Paroxysmal atrial fibrillation: Secondary | ICD-10-CM | POA: Diagnosis present

## 2014-11-02 DIAGNOSIS — N8110 Cystocele, unspecified: Secondary | ICD-10-CM | POA: Diagnosis not present

## 2014-11-02 DIAGNOSIS — Z8249 Family history of ischemic heart disease and other diseases of the circulatory system: Secondary | ICD-10-CM | POA: Diagnosis not present

## 2014-11-02 DIAGNOSIS — K219 Gastro-esophageal reflux disease without esophagitis: Secondary | ICD-10-CM | POA: Diagnosis present

## 2014-11-08 ENCOUNTER — Telehealth: Payer: Self-pay | Admitting: *Deleted

## 2014-11-08 DIAGNOSIS — K219 Gastro-esophageal reflux disease without esophagitis: Secondary | ICD-10-CM | POA: Diagnosis not present

## 2014-11-08 DIAGNOSIS — Z9181 History of falling: Secondary | ICD-10-CM | POA: Diagnosis not present

## 2014-11-08 DIAGNOSIS — Z48816 Encounter for surgical aftercare following surgery on the genitourinary system: Secondary | ICD-10-CM | POA: Diagnosis not present

## 2014-11-08 DIAGNOSIS — Z87440 Personal history of urinary (tract) infections: Secondary | ICD-10-CM | POA: Diagnosis not present

## 2014-11-08 DIAGNOSIS — I4891 Unspecified atrial fibrillation: Secondary | ICD-10-CM | POA: Diagnosis not present

## 2014-11-08 NOTE — Telephone Encounter (Signed)
Pt having [facility] for PT after surgery for gait instability and rehab Verbal order given

## 2014-11-10 DIAGNOSIS — Z48816 Encounter for surgical aftercare following surgery on the genitourinary system: Secondary | ICD-10-CM | POA: Diagnosis not present

## 2014-11-10 DIAGNOSIS — K219 Gastro-esophageal reflux disease without esophagitis: Secondary | ICD-10-CM | POA: Diagnosis not present

## 2014-11-10 DIAGNOSIS — I4891 Unspecified atrial fibrillation: Secondary | ICD-10-CM | POA: Diagnosis not present

## 2014-11-10 DIAGNOSIS — Z9181 History of falling: Secondary | ICD-10-CM | POA: Diagnosis not present

## 2014-11-10 DIAGNOSIS — Z87440 Personal history of urinary (tract) infections: Secondary | ICD-10-CM | POA: Diagnosis not present

## 2014-11-12 ENCOUNTER — Other Ambulatory Visit: Payer: Self-pay | Admitting: Pharmacist

## 2014-11-15 DIAGNOSIS — Z87440 Personal history of urinary (tract) infections: Secondary | ICD-10-CM | POA: Diagnosis not present

## 2014-11-15 DIAGNOSIS — K219 Gastro-esophageal reflux disease without esophagitis: Secondary | ICD-10-CM | POA: Diagnosis not present

## 2014-11-15 DIAGNOSIS — Z9181 History of falling: Secondary | ICD-10-CM | POA: Diagnosis not present

## 2014-11-15 DIAGNOSIS — I4891 Unspecified atrial fibrillation: Secondary | ICD-10-CM | POA: Diagnosis not present

## 2014-11-15 DIAGNOSIS — Z48816 Encounter for surgical aftercare following surgery on the genitourinary system: Secondary | ICD-10-CM | POA: Diagnosis not present

## 2014-11-17 DIAGNOSIS — K219 Gastro-esophageal reflux disease without esophagitis: Secondary | ICD-10-CM | POA: Diagnosis not present

## 2014-11-17 DIAGNOSIS — I4891 Unspecified atrial fibrillation: Secondary | ICD-10-CM | POA: Diagnosis not present

## 2014-11-17 DIAGNOSIS — Z48816 Encounter for surgical aftercare following surgery on the genitourinary system: Secondary | ICD-10-CM | POA: Diagnosis not present

## 2014-11-17 DIAGNOSIS — Z9181 History of falling: Secondary | ICD-10-CM | POA: Diagnosis not present

## 2014-11-17 DIAGNOSIS — Z87440 Personal history of urinary (tract) infections: Secondary | ICD-10-CM | POA: Diagnosis not present

## 2014-11-22 ENCOUNTER — Ambulatory Visit (INDEPENDENT_AMBULATORY_CARE_PROVIDER_SITE_OTHER): Payer: Medicare Other | Admitting: Family Medicine

## 2014-11-22 DIAGNOSIS — K219 Gastro-esophageal reflux disease without esophagitis: Secondary | ICD-10-CM

## 2014-11-22 DIAGNOSIS — Z9181 History of falling: Secondary | ICD-10-CM | POA: Diagnosis not present

## 2014-11-22 DIAGNOSIS — Z48816 Encounter for surgical aftercare following surgery on the genitourinary system: Secondary | ICD-10-CM

## 2014-11-22 DIAGNOSIS — Z87440 Personal history of urinary (tract) infections: Secondary | ICD-10-CM | POA: Diagnosis not present

## 2014-11-22 DIAGNOSIS — I4891 Unspecified atrial fibrillation: Secondary | ICD-10-CM

## 2014-11-24 DIAGNOSIS — I4891 Unspecified atrial fibrillation: Secondary | ICD-10-CM | POA: Diagnosis not present

## 2014-11-24 DIAGNOSIS — Z9181 History of falling: Secondary | ICD-10-CM | POA: Diagnosis not present

## 2014-11-24 DIAGNOSIS — Z48816 Encounter for surgical aftercare following surgery on the genitourinary system: Secondary | ICD-10-CM | POA: Diagnosis not present

## 2014-11-24 DIAGNOSIS — K219 Gastro-esophageal reflux disease without esophagitis: Secondary | ICD-10-CM | POA: Diagnosis not present

## 2014-11-24 DIAGNOSIS — Z87440 Personal history of urinary (tract) infections: Secondary | ICD-10-CM | POA: Diagnosis not present

## 2014-11-26 ENCOUNTER — Other Ambulatory Visit: Payer: Self-pay | Admitting: Family Medicine

## 2014-11-28 ENCOUNTER — Ambulatory Visit (INDEPENDENT_AMBULATORY_CARE_PROVIDER_SITE_OTHER): Payer: Medicare Other | Admitting: Pharmacist

## 2014-11-28 DIAGNOSIS — I4891 Unspecified atrial fibrillation: Secondary | ICD-10-CM

## 2014-11-28 DIAGNOSIS — Z5181 Encounter for therapeutic drug level monitoring: Secondary | ICD-10-CM

## 2014-11-28 LAB — POCT INR: INR: 1.7

## 2014-11-28 NOTE — Patient Instructions (Signed)
Anticoagulation Dose Instructions as of 11/28/2014      Virginia Young Tue Wed Thu Fri Sat   New Dose 2.5 mg 2.5 mg 5 mg 2.5 mg 5 mg 2.5 mg 2.5 mg    Description        Take 1 tablet for the next 3 days - Monday 11/28/14, Tuesday 11/29/14 and Wednesday 11/30/14.  Then restart regular warfarin dose of 5mg  - take 1 tablet on Tuesdays and Thursdays and 1/2 tablet all other days.     INR was 1.7 today (a little thick / low)

## 2014-11-28 NOTE — Telephone Encounter (Signed)
Has not seen a provider here

## 2014-12-06 ENCOUNTER — Other Ambulatory Visit: Payer: Self-pay | Admitting: Family Medicine

## 2014-12-07 ENCOUNTER — Other Ambulatory Visit: Payer: Self-pay | Admitting: Family Medicine

## 2014-12-07 DIAGNOSIS — G8918 Other acute postprocedural pain: Secondary | ICD-10-CM | POA: Insufficient documentation

## 2014-12-07 DIAGNOSIS — R109 Unspecified abdominal pain: Secondary | ICD-10-CM | POA: Diagnosis not present

## 2014-12-07 DIAGNOSIS — R8290 Unspecified abnormal findings in urine: Secondary | ICD-10-CM | POA: Diagnosis not present

## 2014-12-07 NOTE — Telephone Encounter (Signed)
Last seen 04/08/15 B [place]

## 2014-12-13 ENCOUNTER — Other Ambulatory Visit: Payer: Self-pay

## 2014-12-13 MED ORDER — AMIODARONE HCL 200 MG PO TABS: 200.0000 mg | ORAL_TABLET | Freq: Every day | ORAL | Status: DC

## 2014-12-16 DIAGNOSIS — K7689 Other specified diseases of liver: Secondary | ICD-10-CM | POA: Diagnosis not present

## 2014-12-16 DIAGNOSIS — N2889 Other specified disorders of kidney and ureter: Secondary | ICD-10-CM | POA: Diagnosis not present

## 2014-12-16 DIAGNOSIS — I517 Cardiomegaly: Secondary | ICD-10-CM | POA: Diagnosis not present

## 2014-12-16 DIAGNOSIS — G8918 Other acute postprocedural pain: Secondary | ICD-10-CM | POA: Diagnosis not present

## 2014-12-16 DIAGNOSIS — I700 Atherosclerosis of aorta: Secondary | ICD-10-CM | POA: Diagnosis not present

## 2014-12-16 DIAGNOSIS — R935 Abnormal findings on diagnostic imaging of other abdominal regions, including retroperitoneum: Secondary | ICD-10-CM | POA: Diagnosis not present

## 2015-01-13 ENCOUNTER — Encounter: Payer: Self-pay | Admitting: Pharmacist

## 2015-01-13 ENCOUNTER — Ambulatory Visit (INDEPENDENT_AMBULATORY_CARE_PROVIDER_SITE_OTHER): Payer: Medicare Other | Admitting: Pharmacist

## 2015-01-13 VITALS — BP 138/72 | HR 64 | Ht 70.0 in | Wt 187.0 lb

## 2015-01-13 DIAGNOSIS — Z0000 Encounter for general adult medical examination without abnormal findings: Secondary | ICD-10-CM

## 2015-01-13 DIAGNOSIS — Z5181 Encounter for therapeutic drug level monitoring: Secondary | ICD-10-CM

## 2015-01-13 DIAGNOSIS — Z23 Encounter for immunization: Secondary | ICD-10-CM | POA: Diagnosis not present

## 2015-01-13 DIAGNOSIS — I4891 Unspecified atrial fibrillation: Secondary | ICD-10-CM

## 2015-01-13 LAB — POCT INR: INR: 1.5

## 2015-01-13 NOTE — Patient Instructions (Signed)
Virginia Young , Thank you for taking time to come for your Medicare Wellness Visit. I appreciate your ongoing commitment to your health goals. Please review the following plan we discussed and let me know if I can assist you in the future.    This is a list of the screening recommended for you and due dates:  Health Maintenance  Topic Date Due  . Pneumonia vaccines (2 of 2 - PCV13) Completed (dose given in office today)   Mammogram Patient declined  . Flu Shot  01/23/2015  . Tetanus Vaccine  04/26/2020  . Colon Cancer Screening  10/01/2022  . DEXA scan (bone density measurement)  Completed  . Shingles Vaccine  Completed   Continue with walking daily  Fall Prevention and Home Safety Falls cause injuries and can affect all age groups. It is possible to use preventive measures to significantly decrease the likelihood of falls. There are many simple measures which can make your home safer and prevent falls. OUTDOORS  Repair cracks and edges of walkways and driveways.  Remove high doorway thresholds.  Trim shrubbery on the main path into your home.  Have good outside lighting.  Clear walkways of tools, rocks, debris, and clutter.  Check that handrails are not broken and are securely fastened. Both sides of steps should have handrails.  Have leaves, snow, and ice cleared regularly.  Use sand or salt on walkways during winter months.  In the garage, clean up grease or oil spills. BATHROOM  Install night lights.  Install grab bars by the toilet and in the tub and shower.  Use non-skid mats or decals in the tub or shower.  Place a plastic non-slip stool in the shower to sit on, if needed.  Keep floors dry and clean up all water on the floor immediately.  Remove soap buildup in the tub or shower on a regular basis.  Secure bath mats with non-slip, double-sided rug tape.  Remove throw rugs and tripping hazards from the floors. BEDROOMS  Install night lights.  Make  sure a bedside light is easy to reach.  Do not use oversized bedding.  Keep a telephone by your bedside.  Have a firm chair with side arms to use for getting dressed.  Remove throw rugs and tripping hazards from the floor. KITCHEN  Keep handles on pots and pans turned toward the center of the stove. Use back burners when possible.  Clean up spills quickly and allow time for drying.  Avoid walking on wet floors.  Avoid hot utensils and knives.  Position shelves so they are not too high or low.  Place commonly used objects within easy reach.  If necessary, use a sturdy step stool with a grab bar when reaching.  Keep electrical cables out of the way.  Do not use floor polish or wax that makes floors slippery. If you must use wax, use non-skid floor wax.  Remove throw rugs and tripping hazards from the floor. STAIRWAYS  Never leave objects on stairs.  Place handrails on both sides of stairways and use them. Fix any loose handrails. Make sure handrails on both sides of the stairways are as long as the stairs.  Check carpeting to make sure it is firmly attached along stairs. Make repairs to worn or loose carpet promptly.  Avoid placing throw rugs at the top or bottom of stairways, or properly secure the rug with carpet tape to prevent slippage. Get rid of throw rugs, if possible.  Have an electrician put  in a light switch at the top and bottom of the stairs. OTHER FALL PREVENTION TIPS  Wear low-heel or rubber-soled shoes that are supportive and fit well. Wear closed toe shoes.  When using a stepladder, make sure it is fully opened and both spreaders are firmly locked. Do not climb a closed stepladder.  Add color or contrast paint or tape to grab bars and handrails in your home. Place contrasting color strips on first and last steps.  Learn and use mobility aids as needed. Install an electrical emergency response system.  Turn on lights to avoid dark areas. Replace light  bulbs that burn out immediately. Get light switches that glow.  Arrange furniture to create clear pathways. Keep furniture in the same place.  Firmly attach carpet with non-skid or double-sided tape.  Eliminate uneven floor surfaces.  Select a carpet pattern that does not visually hide the edge of steps.  Be aware of all pets. OTHER HOME SAFETY TIPS  Set the water temperature for 120 F (48.8 C).  Keep emergency numbers on or near the telephone.  Keep smoke detectors on every level of the home and near sleeping areas. Document Released: 05/31/2002 Document Revised: 12/10/2011 Document Reviewed: 08/30/2011 [facility] Patient Information 2015 [place], [place]. This information is not intended to replace advice given to you by your health care provider. Make sure you discuss any questions you have with your health care provider.    Increase non-starchy vegetables - carrots, green bean, squash, zucchini, tomatoes, onions, peppers, spinach and other green leafy vegetables, cabbage, lettuce, cucumbers, asparagus, okra (not fried), eggplant limit sugar and processed foods (cakes, cookies, ice cream, crackers and chips) Increase fresh fruit but limit serving sizes 1/2 cup or about the size of tennis or baseball limit red meat to no more than 1-2 times per week (serving size about the size of your palm) Choose whole grains / lean proteins - whole wheat bread, quinoa, whole grain rice (1/2 cup), fish, chicken, [place]

## 2015-01-13 NOTE — Progress Notes (Signed)
Patient ID: Virginia Young, female   DOB: 1940/07/17, 74 y.o.   MRN: [id number]    Subjective:   Virginia Young is a 74 y.o. female who presents for a subsequent  Medicare Annual Wellness Visit and to recheck INR.    Indication: atrial fibrillation Bleeding signs/symptoms: None Thromboembolic signs/symptoms: None  Missed Coumadin doses: This week - one dose missed Medication changes: no Dietary changes: no Bacterial/viral infection: no Other concerns: no  Current Medications (verified) Outpatient Encounter Prescriptions as of 01/13/2015  Medication Sig  . alendronate (FOSAMAX) 70 MG tablet TAKE 1 TABLET ONCE A WEEK WITH A FULL GLASS OF WATER ON AN EMPTY STOMACH  . amiodarone (PACERONE) 200 MG tablet Take 1 tablet (200 mg total) by mouth daily.  . Calcium Citrate-Vitamin D (CALCIUM CITRATE CHEWY BITE) 500-500 MG-UNIT CHEW Chew 2 each by mouth daily.  . Cholecalciferol (VITAMIN D3) 2000 UNITS TABS Take 1 tablet by mouth daily.  Marland Kitchen docusate sodium (COLACE) 100 MG capsule Take 100 mg by mouth 2 (two) times daily.  . meclizine (ANTIVERT) 25 MG tablet Take 1 tablet (25 mg total) by mouth 3 (three) times daily as needed for dizziness.  . nitrofurantoin (MACRODANTIN) 50 MG capsule Take 1 capsule by mouth every evening. For 3 months - through January 15, 2015  . omeprazole (PRILOSEC) 40 MG capsule TAKE 1 CAPSULE BY MOUTH DAILY  . warfarin (COUMADIN) 5 MG tablet TAKE 1/2 TO 1 TABLET DAILY AS DIRECTED  . [DISCONTINUED] amiodarone (PACERONE) 200 MG tablet TAKE 1 TABLET BY MOUTH EVERY DAY   No facility-administered encounter medications on file as of 01/13/2015.    Allergies (verified) Lipitor   History: Past Medical History  Diagnosis Date  . NICM (nonischemic cardiomyopathy)     a. neg CLite in 2003;  b. EF 40-45% in past;   c.  Echo 12/11: EF 35-40%, mild MR, mild LAE, mild RAE, small pericardial effusion   . Mild mitral regurgitation   . HLD (hyperlipidemia)   . Chronic systolic  heart failure   . Atrial fibrillation     a. coumadin;  b. Amiodarone  . Stasis ulcer   . Varicose veins   . Depression   . Osteoarthritis   . Chest pain 03/2012    a. Lex MV 10/13:  EF 64%, dist ant and apical defect sugg of soft tissue atten, no ischemia  . Cataract   . GERD (gastroesophageal reflux disease)   . Osteoporosis   . MS (multiple sclerosis)   . Incontinence of urine   . Uterine prolapse   . Vaginal prolapse without uterine prolapse    Past Surgical History  Procedure Laterality Date  . Lipoma excision      h/o removed from upper back x 2  . Inguinal hernia repair      right  . Cataract surgery Bilateral   . Eye surgery    . Anterior and posterior vaginal repair w/ sacrospinous ligament suspension  2016    [facility]   Family History  Problem Relation Age of Onset  . Diabetes Father 3    diabetes and syncope  . Heart attack Father   . CAD    . Heart attack Sister   . Heart attack Brother     all 5 brothers had MIs  . Stroke Brother   . Colon cancer Neg Hx   . Cancer Sister     bone  . Diabetes Sister   . Cancer Sister   . Diabetes Sister   .  Heart attack Brother   . Diabetes Daughter    Social History   Occupational History  . retired    Social History Main Topics  . Smoking status: Never Smoker   . Smokeless tobacco: Never Used  . Alcohol Use: No  . Drug Use: No  . Sexual Activity: Not Currently    Do you feel safe at home?  Yes  Dietary issues and exercise activities: Current Exercise Habits:: Home exercise routine, Type of exercise: walking, Time (Minutes): 20, Frequency (Times/Week): 6, Weekly Exercise (Minutes/Week): 120, Intensity: Moderate  Current Dietary habits:  No following any specific dietary recommendations.   Objective:    Today's Vitals   01/13/15 1139  BP: 138/72  Pulse: 64  Height: 5\' 10"  (1.778 m)  Weight: 187 lb (84.823 kg)   Body mass index is 26.83 kg/(m^2).  INR was 1.5 today  Activities of Daily Living In  your present state of health, do you have any difficulty performing the following activities: 01/13/2015  Hearing? N  Vision? N  Difficulty concentrating or making decisions? N  Walking or climbing stairs? Y  Dressing or bathing? N  Doing errands, shopping? N  Preparing Food and eating ? N  Using the Toilet? N  In the past six months, have you accidently leaked urine? N  Do you have problems with loss of bowel control? Y  Managing your Medications? N  Managing your Finances? N  Housekeeping or managing your Housekeeping? N   Are there smokers in your home (other than you)? No   Cardiac Risk Factors include: advanced age (>87men, >70 women);family history of premature cardiovascular disease  Depression Screen PHQ 2/9 Scores 01/13/2015 04/07/2014 01/05/2014 09/08/2013  PHQ - 2 Score 1 0 2 1  PHQ- 9 Score - - 3 -    Fall Risk Fall Risk  01/13/2015 04/07/2014 01/05/2014 09/08/2013  Falls in the past year? No No Yes Yes  Number falls in past yr: - - 2 or more 1  Injury with Fall? - - - Yes  Risk Factor Category  - - High Fall Risk -  Risk for fall due to : - - Impaired balance/gait Impaired balance/gait  Risk for fall due to (comments): - - possible vertigo -    Cognitive Function: MMSE - Mini Mental State Exam 01/13/2015  Orientation to time 2  Orientation to Place 4  Registration 3  Attention/ Calculation 4  Recall 3  Language- name 2 objects 1  Language- repeat 1  Language- follow 3 step command 3  Language- read & follow direction 1  Write a sentence 1  Copy design 0  Total score 23    Immunizations and Health Maintenance Immunization History  Administered Date(s) Administered  . Influenza Split 04/03/2012  . Influenza Whole 04/30/2006, 04/17/2010, 04/25/2011  . Influenza,inj,Quad PF,36+ Mos 03/23/2013, 03/30/2014  . Pneumococcal Conjugate-13 01/13/2015  . Pneumococcal Polysaccharide-23 02/02/2009  . Pneumococcal-Unspecified 05/01/2011  . Td 04/26/2010  . Zoster  04/26/2010   There are no preventive care reminders to display for this patient.  Patient Care Team: Chevis Pretty, FNP as PCP - General (Nurse Practitioner) Fay Records, MD (Cardiology)  Indicate any recent Medical Services you may have received from other than [facility] providers in the past year (date may be approximate).    Assessment:    Annual Wellness Visit  Subtherapeutic anticoagulation   Screening Tests Health Maintenance  Topic Date Due  . INFLUENZA VACCINE  01/23/2015  . DEXA SCAN  04/06/2016  .  TETANUS/TDAP  04/26/2020  . COLONOSCOPY  10/01/2022  . ZOSTAVAX  Completed  . PNA vac Low Risk Adult  Completed      Plan:   During the course of the visit Cathryne was educated and counseled about the following appropriate screening and preventive services:   Vaccines to include Pneumoccal, Influenza, Hepatitis B, Td, Zostavax, - Prevnar 13 given in office today  Colorectal cancer screening - colonoscopy UTD;  Patient refuses FOBT  Cardiovascular disease screening - patient is due to have lipids checked but is not fasting.  She also need physical / check up from PCP - appt make for her today  Diabetes screening - FBG = WNL  Bone Denisty / Osteoporosis Screening - UTD  Mammogram - patient refuesed  PAP - UTD  Glaucoma screening / Diabetic Eye Exam - UTD  Advanced Directives --declined  Fall prevention discussed  Anticoagulation Dose Instructions as of 01/13/2015      Dorene Grebe Tue Wed Thu Fri Sat   New Dose 2.5 mg 2.5 mg 5 mg 2.5 mg 5 mg 2.5 mg 5 mg    Description        Take 1 tablet today - Friday, July 22nd then increase warfarin 5mg  dose to  take 1 tablet on Tuesdays,Thursdays and Saturdays and 1/2 tablet all other days.     RTC in 3 weeks.  Patient Instructions (the written plan) were given to the patient.   Cherre Robins, [facility]   01/13/2015

## 2015-01-18 DIAGNOSIS — H59093 Other disorders of the eye following cataract surgery, bilateral: Secondary | ICD-10-CM | POA: Diagnosis not present

## 2015-01-18 DIAGNOSIS — Z961 Presence of intraocular lens: Secondary | ICD-10-CM | POA: Diagnosis not present

## 2015-02-10 ENCOUNTER — Ambulatory Visit (INDEPENDENT_AMBULATORY_CARE_PROVIDER_SITE_OTHER): Payer: Medicare Other | Admitting: Pharmacist

## 2015-02-10 DIAGNOSIS — Z5181 Encounter for therapeutic drug level monitoring: Secondary | ICD-10-CM

## 2015-02-10 DIAGNOSIS — I4891 Unspecified atrial fibrillation: Secondary | ICD-10-CM

## 2015-02-10 LAB — POCT INR: INR: 1.6

## 2015-02-10 NOTE — Patient Instructions (Signed)
Anticoagulation Dose Instructions as of 02/10/2015      Virginia Young Tue Wed Thu Fri Sat   New Dose 2.5 mg 2.5 mg 5 mg 2.5 mg 5 mg 2.5 mg 5 mg    Description        Take 1 tablet today - Friday, August 19th, then resume warfarin 5mg  dose to  take 1 tablet on Tuesdays,Thursdays and Saturdays and 1/2 tablet all other days.     INR was 1.6 today

## 2015-03-07 ENCOUNTER — Other Ambulatory Visit: Payer: Self-pay | Admitting: Family Medicine

## 2015-03-20 DIAGNOSIS — Z23 Encounter for immunization: Secondary | ICD-10-CM | POA: Diagnosis not present

## 2015-03-27 DIAGNOSIS — H01009 Unspecified blepharitis unspecified eye, unspecified eyelid: Secondary | ICD-10-CM | POA: Diagnosis not present

## 2015-03-27 DIAGNOSIS — H5203 Hypermetropia, bilateral: Secondary | ICD-10-CM | POA: Diagnosis not present

## 2015-03-27 DIAGNOSIS — H26493 Other secondary cataract, bilateral: Secondary | ICD-10-CM | POA: Diagnosis not present

## 2015-03-27 DIAGNOSIS — H524 Presbyopia: Secondary | ICD-10-CM | POA: Diagnosis not present

## 2015-03-27 DIAGNOSIS — H52223 Regular astigmatism, bilateral: Secondary | ICD-10-CM | POA: Diagnosis not present

## 2015-03-27 DIAGNOSIS — H16229 Keratoconjunctivitis sicca, not specified as Sjogren's, unspecified eye: Secondary | ICD-10-CM | POA: Diagnosis not present

## 2015-04-13 DIAGNOSIS — H52223 Regular astigmatism, bilateral: Secondary | ICD-10-CM | POA: Diagnosis not present

## 2015-04-13 DIAGNOSIS — H524 Presbyopia: Secondary | ICD-10-CM | POA: Diagnosis not present

## 2015-04-13 DIAGNOSIS — H5203 Hypermetropia, bilateral: Secondary | ICD-10-CM | POA: Diagnosis not present

## 2015-04-13 DIAGNOSIS — H26493 Other secondary cataract, bilateral: Secondary | ICD-10-CM | POA: Diagnosis not present

## 2015-04-14 ENCOUNTER — Ambulatory Visit (INDEPENDENT_AMBULATORY_CARE_PROVIDER_SITE_OTHER): Payer: Medicare Other | Admitting: Pharmacist

## 2015-04-14 ENCOUNTER — Encounter: Payer: Self-pay | Admitting: Pharmacist

## 2015-04-14 DIAGNOSIS — I4891 Unspecified atrial fibrillation: Secondary | ICD-10-CM

## 2015-04-14 DIAGNOSIS — Z5181 Encounter for therapeutic drug level monitoring: Secondary | ICD-10-CM

## 2015-04-14 LAB — POCT INR: INR: 1.8

## 2015-04-14 MED ORDER — OMEPRAZOLE 40 MG PO CPDR: 40.0000 mg | DELAYED_RELEASE_CAPSULE | Freq: Every day | ORAL | Status: DC

## 2015-04-14 NOTE — Patient Instructions (Signed)
Anticoagulation Dose Instructions as of 04/14/2015      Dorene Grebe Tue Wed Thu Fri Sat   New Dose 2.5 mg 2.5 mg 5 mg 2.5 mg 5 mg 2.5 mg 5 mg    Description        Take 1 tablet today - Friday, October 21st.  Then resume warfarin 5mg  dose to  take 1 tablet on Tuesdays,Thursdays and Saturdays and 1/2 tablet all other days.     INR was 1.8 today

## 2015-05-15 ENCOUNTER — Telehealth: Payer: Self-pay | Admitting: Family Medicine

## 2015-05-15 ENCOUNTER — Encounter: Payer: Self-pay | Admitting: Family Medicine

## 2015-05-15 ENCOUNTER — Other Ambulatory Visit: Payer: Self-pay | Admitting: Family Medicine

## 2015-05-15 ENCOUNTER — Ambulatory Visit (INDEPENDENT_AMBULATORY_CARE_PROVIDER_SITE_OTHER): Payer: Medicare Other | Admitting: Family Medicine

## 2015-05-15 VITALS — BP 118/83 | HR 79 | Temp 97.0°F | Ht 70.0 in | Wt 184.4 lb

## 2015-05-15 DIAGNOSIS — I4891 Unspecified atrial fibrillation: Secondary | ICD-10-CM

## 2015-05-15 DIAGNOSIS — R0982 Postnasal drip: Secondary | ICD-10-CM | POA: Diagnosis not present

## 2015-05-15 DIAGNOSIS — Z0000 Encounter for general adult medical examination without abnormal findings: Secondary | ICD-10-CM | POA: Insufficient documentation

## 2015-05-15 DIAGNOSIS — I5022 Chronic systolic (congestive) heart failure: Secondary | ICD-10-CM

## 2015-05-15 DIAGNOSIS — E785 Hyperlipidemia, unspecified: Secondary | ICD-10-CM | POA: Diagnosis not present

## 2015-05-15 MED ORDER — FLUTICASONE PROPIONATE 50 MCG/ACT NA SUSP: 2.0000 | Freq: Every day | NASAL | Status: DC

## 2015-05-15 MED ORDER — MOMETASONE FUROATE 50 MCG/ACT NA SUSP: NASAL | Status: DC

## 2015-05-15 NOTE — Telephone Encounter (Signed)
flonase sent instead of nasonex.   Laroy Apple, MD [facility] 05/15/2015, 10:35 AM

## 2015-05-15 NOTE — Patient Instructions (Signed)
Great to meet you!  Please come back if you have any concerning symptoms, otherwise lets see ezach other in 6 months.   Consider seeing Hochrein (cardiology) once a year as well.

## 2015-05-15 NOTE — Progress Notes (Signed)
   HPI  Patient presents today for [facility] follow up  She feels well overall but complains of cough She has stable DOE, also has stable 2 pillow orthopnea She c/o episodic cough that comes on occasionally in fits. She also has frequent throat clearing in the morning for the same time.  No fevers, chills, sweats, new dyspnea, chest pain, palps, or persistent leg edema    PMH: Smoking status noted ROS: Per HPI  Objective: BP 118/83 mmHg  Pulse 79  Temp(Src) 97 F (36.1 C) (Oral)  Ht _0  (1.778 m)  Wt 184 lb 6.4 oz (83.643 kg)  BMI 26.46 kg/m2 Gen: NAD, alert, cooperative with exam HEENT: NCAT, Nares with some swelling of the turbinates and bogginess of the mucosa BL, oropharynx clear  CV: RRR, good S1/S2, no murmur appreciated (note MR) Resp: CTABL, no wheezes, non-labored Ext: varicose veins, no edema Neuro: Alert and oriented, No gross deficits  Assessment and plan:  # CHF Compensated Mild systolic CHF, possibly responsible for DOE and orthopnea, however no signs of volume overoad tpoday Consider repeat echo, consider adding BB, consider Loop ( no volume overload) encouraged annual Cardiology visit.   # A fib Rate controlled on amio Added TSH to labs No BB but rate controlled Coumadin  # HCM Non fasting labs (only coffee with creamer  # HLD Labs, consider statin.   Post nasal drip, cough Add nasonex     Orders Placed This Encounter  Procedures  . CMP14+EGFR  . CBC  . Lipid Panel    No orders of the defined types were placed in this encounter.    Laroy Apple, MD [facility] 05/15/2015, 8:19 AM

## 2015-05-16 LAB — CMP14+EGFR
ALT: 16 IU/L (ref 0–32)
AST: 17 IU/L (ref 0–40)
Albumin/Globulin Ratio: 1.6 (ref 1.1–2.5)
Albumin: 4.1 g/dL (ref 3.5–4.8)
Alkaline Phosphatase: 97 IU/L (ref 39–117)
BUN/Creatinine Ratio: 16 (ref 11–26)
BUN: 16 mg/dL (ref 8–27)
Bilirubin Total: 0.3 mg/dL (ref 0.0–1.2)
CO2: 26 mmol/L (ref 18–29)
Calcium: 9.1 mg/dL (ref 8.7–10.3)
Chloride: 97 mmol/L (ref 97–106)
Creatinine, Ser: 0.99 mg/dL (ref 0.57–1.00)
GFR calc Af Amer: 64 mL/min/{1.73_m2} (ref >59)
GFR calc non Af Amer: 56 mL/min/{1.73_m2} — ABNORMAL LOW (ref >59)
Globulin, Total: 2.5 g/dL (ref 1.5–4.5)
Glucose: 95 mg/dL (ref 65–99)
Potassium: 5 mmol/L (ref 3.5–5.2)
Sodium: 137 mmol/L (ref 136–144)
Total Protein: 6.6 g/dL (ref 6.0–8.5)

## 2015-05-16 LAB — CBC
Hematocrit: 43.1 % (ref 34.0–46.6)
Hemoglobin: 14.3 g/dL (ref 11.1–15.9)
MCH: 28.9 pg (ref 26.6–33.0)
MCHC: 33.2 g/dL (ref 31.5–35.7)
MCV: 87 fL (ref 79–97)
Platelets: 259 10*3/uL (ref 150–379)
RBC: 4.95 x10E6/uL (ref 3.77–5.28)
RDW: 15 % (ref 12.3–15.4)
WBC: 8.5 10*3/uL (ref 3.4–10.8)

## 2015-05-16 LAB — LIPID PANEL
Chol/HDL Ratio: 2.7 ratio units (ref 0.0–4.4)
Cholesterol, Total: 196 mg/dL (ref 100–199)
HDL: 73 mg/dL (ref >39)
LDL Calculated: 108 mg/dL — ABNORMAL HIGH (ref 0–99)
Triglycerides: 74 mg/dL (ref 0–149)
VLDL Cholesterol Cal: 15 mg/dL (ref 5–40)

## 2015-05-16 LAB — TSH: TSH: 1.57 u[IU]/mL (ref 0.450–4.500)

## 2015-05-19 NOTE — Telephone Encounter (Signed)
Left message that rx has been changed and sent to pharmacy.

## 2015-06-22 ENCOUNTER — Ambulatory Visit (INDEPENDENT_AMBULATORY_CARE_PROVIDER_SITE_OTHER): Payer: Medicare Other | Admitting: Pharmacist

## 2015-06-22 DIAGNOSIS — I4891 Unspecified atrial fibrillation: Secondary | ICD-10-CM

## 2015-06-22 DIAGNOSIS — Z5181 Encounter for therapeutic drug level monitoring: Secondary | ICD-10-CM

## 2015-06-22 LAB — POCT INR: INR: 2.1

## 2015-06-22 NOTE — Patient Instructions (Signed)
Anticoagulation Dose Instructions as of 06/22/2015      Virginia Young Tue Wed Thu Fri Sat   New Dose 2.5 mg 2.5 mg 5 mg 2.5 mg 5 mg 2.5 mg 5 mg    Description        Continue current warfarin 5mg  dose of take 1 tablet on Tuesdays,Thursdays and Saturdays and 1/2 tablet all other days.     INR was 2.1 today

## 2015-06-29 ENCOUNTER — Encounter: Payer: Self-pay | Admitting: Family Medicine

## 2015-06-29 ENCOUNTER — Ambulatory Visit (INDEPENDENT_AMBULATORY_CARE_PROVIDER_SITE_OTHER): Payer: Medicare Other | Admitting: Family Medicine

## 2015-06-29 VITALS — BP 129/81 | HR 93 | Temp 97.0°F | Ht 70.0 in | Wt 184.6 lb

## 2015-06-29 DIAGNOSIS — J209 Acute bronchitis, unspecified: Secondary | ICD-10-CM | POA: Diagnosis not present

## 2015-06-29 MED ORDER — ALBUTEROL SULFATE HFA 108 (90 BASE) MCG/ACT IN AERS: 2.0000 | INHALATION_SPRAY | Freq: Four times a day (QID) | RESPIRATORY_TRACT | Status: DC | PRN

## 2015-06-29 MED ORDER — CEFDINIR 300 MG PO CAPS: 300.0000 mg | ORAL_CAPSULE | Freq: Two times a day (BID) | ORAL | Status: DC

## 2015-06-29 NOTE — Progress Notes (Signed)
BP 129/81 mmHg  Pulse 93  Temp(Src) 97 F (36.1 C) (Oral)  Ht 5\' 10"  (1.778 m)  Wt 184 lb 9.6 oz (83.734 kg)  BMI 26.49 kg/m2   Subjective:    Patient ID: Virginia Young, female    DOB: [birth year], 75 y.o.   MRN: [id number]  HPI: Virginia Young is a 75 y.o. female presenting on 06/29/2015 for Vomiting and Sinusitis   HPI Chest congestion and sinus congestion Patient presents today with a six-day history of sinus congestion and postnasal drainage and nasal discharge and chest congestion that is been getting worse over this last time. She denies any fevers or chills. She does complain of some shortness of breath with her chest congestion but no wheezing. Her family who is visiting for the Christmas season had multiple members that were ill with similar sinus problems. She did have 2 episodes of vomiting today associated with the congestion and coughing.  Relevant past medical, surgical, family and social history reviewed and updated as indicated. Interim medical history since our last visit reviewed. Allergies and medications reviewed and updated.  Review of Systems  Constitutional: Negative for fever and chills.  HENT: Positive for congestion, postnasal drip, rhinorrhea, sinus pressure and sore throat. Negative for ear discharge, ear pain and sneezing.   Eyes: Negative for pain, redness and visual disturbance.  Respiratory: Positive for cough and shortness of breath. Negative for chest tightness.   Cardiovascular: Negative for chest pain and leg swelling.  Genitourinary: Negative for dysuria and difficulty urinating.  Musculoskeletal: Negative for back pain and gait problem.  Skin: Negative for rash.  Neurological: Negative for light-headedness and headaches.  Psychiatric/Behavioral: Negative for behavioral problems and agitation.  All other systems reviewed and are negative.   Per HPI unless specifically indicated above     Medication List       This list is  accurate as of: 06/29/15  3:26 PM.  Always use your most recent med list.               albuterol 108 (90 Base) MCG/ACT inhaler  Commonly known as:  PROVENTIL HFA;VENTOLIN HFA  Inhale 2 puffs into the lungs every 6 (six) hours as needed for wheezing or shortness of breath.     alendronate 70 MG tablet  Commonly known as:  FOSAMAX  TAKE 1 TABLET ONCE A WEEK WITH A FULL GLASS OF WATER ON AN EMPTY STOMACH     amiodarone 200 MG tablet  Commonly known as:  PACERONE  Take 1 tablet (200 mg total) by mouth daily.     CALCIUM CITRATE CHEWY BITE 500-500 MG-UNIT Chew  Generic drug:  Calcium Citrate-Vitamin D  Chew 2 each by mouth daily.     cefdinir 300 MG capsule  Commonly known as:  OMNICEF  Take 1 capsule (300 mg total) by mouth 2 (two) times daily. 1 po BID     fluticasone 50 MCG/ACT nasal spray  Commonly known as:  FLONASE  Place 2 sprays into both nostrils daily.     meclizine 25 MG tablet  Commonly known as:  ANTIVERT  Take 1 tablet (25 mg total) by mouth 3 (three) times daily as needed for dizziness.     omeprazole 40 MG capsule  Commonly known as:  PRILOSEC  Take 1 capsule (40 mg total) by mouth daily.     Vitamin D3 2000 units Tabs  Take 1 tablet by mouth daily.     warfarin 5 MG tablet  Commonly  known as:  COUMADIN  TAKE 1/2 TO 1 TABLET DAILY AS DIRECTED           Objective:    BP 129/81 mmHg  Pulse 93  Temp(Src) 97 F (36.1 C) (Oral)  Ht 5\' 10"  (1.778 m)  Wt 184 lb 9.6 oz (83.734 kg)  BMI 26.49 kg/m2  Wt Readings from Last 3 Encounters:  06/29/15 184 lb 9.6 oz (83.734 kg)  05/15/15 184 lb 6.4 oz (83.643 kg)  01/13/15 187 lb (84.823 kg)    Physical Exam  Constitutional: She is oriented to person, place, and time. She appears well-developed and well-nourished. No distress.  HENT:  Right Ear: Tympanic membrane, external ear and ear canal normal.  Left Ear: Tympanic membrane, external ear and ear canal normal.  Nose: Mucosal edema and rhinorrhea present.  No epistaxis. Right sinus exhibits no maxillary sinus tenderness and no frontal sinus tenderness. Left sinus exhibits no maxillary sinus tenderness and no frontal sinus tenderness.  Mouth/Throat: Uvula is midline and mucous membranes are normal. Posterior oropharyngeal edema and posterior oropharyngeal erythema present. No oropharyngeal exudate or tonsillar abscesses.  Eyes: Conjunctivae and EOM are normal. Pupils are equal, round, and reactive to light.  Neck: Neck supple. No thyromegaly present.  Cardiovascular: Normal rate, regular rhythm, normal heart sounds and intact distal pulses.   No murmur heard. Pulmonary/Chest: Effort normal and breath sounds normal. No respiratory distress. She has no wheezes.  Musculoskeletal: Normal range of motion. She exhibits no edema or tenderness.  Lymphadenopathy:    She has no cervical adenopathy.  Neurological: She is alert and oriented to person, place, and time. Coordination normal.  Skin: Skin is warm and dry. No rash noted. She is not diaphoretic.  Psychiatric: She has a normal mood and affect. Her behavior is normal.  Nursing note and vitals reviewed.   Results for orders placed or performed in visit on 06/22/15  POCT INR  Result Value Ref Range   INR 2.1       Assessment & Plan:   Problem List Items Addressed This Visit    None    Visit Diagnoses    Acute bronchitis, unspecified organism    -  Primary    Relevant Medications    cefdinir (OMNICEF) 300 MG capsule    albuterol (PROVENTIL HFA;VENTOLIN HFA) 108 (90 Base) MCG/ACT inhaler       Patient is on Coumadin and needs to have her levels rechecked in about 2 weeks,  after she finishes her antibiotic course.  Follow up plan: Return if symptoms worsen or fail to improve.  Counseling provided for all of the vaccine components No orders of the defined types were placed in this encounter.    Caryl Pina, MD [facility] 06/29/2015, 3:26 PM

## 2015-07-05 ENCOUNTER — Other Ambulatory Visit: Payer: Self-pay | Admitting: Family Medicine

## 2015-07-09 ENCOUNTER — Other Ambulatory Visit: Payer: Self-pay | Admitting: Family Medicine

## 2015-08-03 ENCOUNTER — Ambulatory Visit (INDEPENDENT_AMBULATORY_CARE_PROVIDER_SITE_OTHER): Payer: Medicare Other | Admitting: Pharmacist

## 2015-08-03 DIAGNOSIS — I4891 Unspecified atrial fibrillation: Secondary | ICD-10-CM

## 2015-08-03 DIAGNOSIS — R42 Dizziness and giddiness: Secondary | ICD-10-CM

## 2015-08-03 DIAGNOSIS — Z5181 Encounter for therapeutic drug level monitoring: Secondary | ICD-10-CM | POA: Diagnosis not present

## 2015-08-03 LAB — POCT INR: INR: 3.1

## 2015-08-03 MED ORDER — MECLIZINE HCL 25 MG PO TABS: 25.0000 mg | ORAL_TABLET | Freq: Three times a day (TID) | ORAL | Status: DC | PRN

## 2015-08-03 NOTE — Patient Instructions (Signed)
Anticoagulation Dose Instructions as of 08/03/2015      Virginia Young Tue Wed Thu Fri Sat   New Dose 2.5 mg 2.5 mg 5 mg 2.5 mg 5 mg 2.5 mg 5 mg    Description        No warfarin today - Thursday, February 9th then continue current warfarin 5mg  dose of take 1 tablet on Tuesdays,Thursdays and Saturdays and 1/2 tablet all other days.     INR was 3.1 today

## 2015-09-07 ENCOUNTER — Encounter: Payer: Self-pay | Admitting: Pharmacist

## 2015-09-08 ENCOUNTER — Ambulatory Visit (INDEPENDENT_AMBULATORY_CARE_PROVIDER_SITE_OTHER): Payer: Medicare Other | Admitting: Pharmacist

## 2015-09-08 DIAGNOSIS — I4891 Unspecified atrial fibrillation: Secondary | ICD-10-CM

## 2015-09-08 DIAGNOSIS — Z5181 Encounter for therapeutic drug level monitoring: Secondary | ICD-10-CM | POA: Diagnosis not present

## 2015-09-08 LAB — COAGUCHEK XS/INR WAIVED
INR: 2.4 — ABNORMAL HIGH (ref 0.9–1.1)
Prothrombin Time: 28.6 s

## 2015-09-08 MED ORDER — WARFARIN SODIUM 5 MG PO TABS: ORAL_TABLET | ORAL | Status: DC

## 2015-09-08 MED ORDER — AMIODARONE HCL 200 MG PO TABS: ORAL_TABLET | ORAL | Status: DC

## 2015-09-08 MED ORDER — DOCUSATE SODIUM 100 MG PO CAPS: 100.0000 mg | ORAL_CAPSULE | Freq: Two times a day (BID) | ORAL | Status: DC

## 2015-09-08 NOTE — Patient Instructions (Signed)
Anticoagulation Dose Instructions as of 09/08/2015      Dorene Grebe Tue Wed Thu Fri Sat   New Dose 2.5 mg 2.5 mg 5 mg 2.5 mg 5 mg 2.5 mg 5 mg    Description        Continue current warfarin 5mg  dose of take 1 tablet on Tuesdays,Thursdays and Saturdays and 1/2 tablet all other days.     INR was 2.4 today

## 2015-09-11 ENCOUNTER — Encounter: Payer: Self-pay | Admitting: Family Medicine

## 2015-09-11 ENCOUNTER — Ambulatory Visit (INDEPENDENT_AMBULATORY_CARE_PROVIDER_SITE_OTHER): Payer: Medicare Other | Admitting: Family Medicine

## 2015-09-11 VITALS — BP 164/79 | HR 66 | Temp 97.0°F | Ht 70.0 in | Wt 190.4 lb

## 2015-09-11 DIAGNOSIS — R1030 Lower abdominal pain, unspecified: Secondary | ICD-10-CM

## 2015-09-11 DIAGNOSIS — R1031 Right lower quadrant pain: Secondary | ICD-10-CM

## 2015-09-11 NOTE — Progress Notes (Signed)
   HPI  Patient presents today here with) pain.  Patient's lines of C1 on for about 9 months. He does she describes it as sharp right sided groin pain at the site of a previous hernia which was corrected with surgery several years ago. She states it hurts worse with lifting, bearing down, coughing, or laughing.  She has some mild pain with movement of her leg which seems separate. She denies any erythema, firmness, or swelling of the area. She also denies fever, chills, sweats.   PMH: Smoking status noted ROS: Per HPI  Objective: BP 164/79 mmHg  Pulse 66  Temp(Src) 97 F (36.1 C) (Oral)  Ht 5\' 10"  (1.778 m)  Wt 190 lb 6.4 oz (86.365 kg)  BMI 27.32 kg/m2 Gen: NAD, alert, cooperative with exam HEENT: NCAT CV: RRR, good S1/S2, no murmur Resp: CTAB GU: Right inguinal canal with a palpable mass with bearing down tender to palpation, no palpable defect, no erythema, warmth, Ext: No edema, warm  Assessment and plan:  # Right-sided groin pain Possible femoral hernia Ultrasound to evaluate Possible referral to general surgery Return to clinic with any concerns, red flags for nursing care reviewed   Orders Placed This Encounter  Procedures  . [place] Misc Soft Tissue    Standing Status: Future     Number of Occurrences:      Standing Expiration Date: 11/10/2016    Order Specific Question:  Reason for Exam (SYMPTOM  OR DIAGNOSIS REQUIRED)    Answer:  R inguinal canal/groin, femoral or inguinal hernia eval    Order Specific Question:  Preferred imaging location?    Answer:  [facility]    No orders of the defined types were placed in this encounter.    Laroy Apple, MD [facility] 09/11/2015, 3:18 PM

## 2015-09-11 NOTE — Patient Instructions (Signed)
Great to meet you!  We will get the ultrasound set up and follow up by phone after that  If you have sudden worsening pain, redness, warmth, or severe pain please seek help in the emergency room.

## 2015-09-20 ENCOUNTER — Ambulatory Visit (HOSPITAL_COMMUNITY)
Admission: RE | Admit: 2015-09-20 | Discharge: 2015-09-20 | Disposition: A | Discharge status: Home / Self Care | Payer: Medicare Other | Source: Ambulatory Visit | Attending: Family Medicine | Admitting: Family Medicine

## 2015-09-20 DIAGNOSIS — R1031 Right lower quadrant pain: Secondary | ICD-10-CM | POA: Insufficient documentation

## 2015-09-20 DIAGNOSIS — R1030 Lower abdominal pain, unspecified: Secondary | ICD-10-CM | POA: Diagnosis present

## 2015-09-21 ENCOUNTER — Encounter: Payer: Self-pay | Admitting: *Deleted

## 2015-09-28 ENCOUNTER — Telehealth: Payer: Self-pay | Admitting: Family Medicine

## 2015-09-28 NOTE — Telephone Encounter (Signed)
Amiodarone can increase INR significantly, however Virginia Young has been taking both warfarin and amiodarone for several years.  There has been no recent change in amiodarone dose.   Virginia Young has INR's checked regularly and last INR was 2.4 which is within range.  Please ask [facility] to fill both warfarin and amiodarone as prescribed.

## 2015-09-28 NOTE — Telephone Encounter (Signed)
[facility] aware that these medication are ok to take together for this patient

## 2015-10-23 ENCOUNTER — Ambulatory Visit (INDEPENDENT_AMBULATORY_CARE_PROVIDER_SITE_OTHER): Payer: Medicare Other | Admitting: Pharmacist

## 2015-10-23 DIAGNOSIS — Z5181 Encounter for therapeutic drug level monitoring: Secondary | ICD-10-CM | POA: Diagnosis not present

## 2015-10-23 DIAGNOSIS — I4891 Unspecified atrial fibrillation: Secondary | ICD-10-CM | POA: Diagnosis not present

## 2015-10-23 LAB — COAGUCHEK XS/INR WAIVED
INR: 2.8 — ABNORMAL HIGH (ref 0.9–1.1)
Prothrombin Time: 33.2 s

## 2015-10-23 NOTE — Patient Instructions (Signed)
Anticoagulation Dose Instructions as of 10/23/2015      Virginia Young Tue Wed Thu Fri Sat   New Dose 2.5 mg 2.5 mg 5 mg 2.5 mg 5 mg 2.5 mg 5 mg    Description        Continue current warfarin 5mg  dose of take 1 tablet on Tuesdays,Thursdays and Saturdays and 1/2 tablet all other days.     INR was 2.8 today

## 2015-12-04 ENCOUNTER — Encounter: Payer: Self-pay | Admitting: Pharmacist

## 2015-12-05 ENCOUNTER — Encounter: Payer: Self-pay | Admitting: Family Medicine

## 2015-12-05 ENCOUNTER — Ambulatory Visit (INDEPENDENT_AMBULATORY_CARE_PROVIDER_SITE_OTHER): Payer: Medicare Other | Admitting: Pharmacist

## 2015-12-05 DIAGNOSIS — I4891 Unspecified atrial fibrillation: Secondary | ICD-10-CM

## 2015-12-05 DIAGNOSIS — Z5181 Encounter for therapeutic drug level monitoring: Secondary | ICD-10-CM

## 2015-12-05 LAB — COAGUCHEK XS/INR WAIVED
INR: 3.7 — ABNORMAL HIGH (ref 0.9–1.1)
Prothrombin Time: 44.2 s

## 2015-12-05 MED ORDER — ALENDRONATE SODIUM 70 MG PO TABS: ORAL_TABLET | ORAL | Status: DC

## 2015-12-05 NOTE — Patient Instructions (Signed)
Anticoagulation Dose Instructions as of 12/05/2015      Virginia Young Tue Wed Thu Fri Sat   New Dose 2.5 mg 2.5 mg 5 mg 2.5 mg 2.5 mg 2.5 mg 5 mg    Description        No warfarin today - Tuesday, June 13th.   Then decrease warfarin 5mg  dose to 1 tablet on tuesdays and saturdays and 1/2 tablet all other days.     INR was 3.7 today

## 2015-12-25 ENCOUNTER — Encounter: Payer: Self-pay | Admitting: Pharmacist

## 2015-12-27 ENCOUNTER — Other Ambulatory Visit: Payer: Self-pay | Admitting: Family Medicine

## 2016-01-15 ENCOUNTER — Ambulatory Visit (INDEPENDENT_AMBULATORY_CARE_PROVIDER_SITE_OTHER): Payer: Medicare Other | Admitting: Pharmacist

## 2016-01-15 ENCOUNTER — Telehealth: Payer: Self-pay | Admitting: Pharmacist

## 2016-01-15 ENCOUNTER — Encounter: Payer: Self-pay | Admitting: Pharmacist

## 2016-01-15 VITALS — BP 124/82 | HR 68 | Ht 68.5 in | Wt 186.0 lb

## 2016-01-15 DIAGNOSIS — Z5181 Encounter for therapeutic drug level monitoring: Secondary | ICD-10-CM

## 2016-01-15 DIAGNOSIS — M25561 Pain in right knee: Secondary | ICD-10-CM

## 2016-01-15 DIAGNOSIS — Z0000 Encounter for general adult medical examination without abnormal findings: Secondary | ICD-10-CM

## 2016-01-15 DIAGNOSIS — I4891 Unspecified atrial fibrillation: Secondary | ICD-10-CM | POA: Diagnosis not present

## 2016-01-15 DIAGNOSIS — M25562 Pain in left knee: Principal | ICD-10-CM

## 2016-01-15 LAB — COAGUCHEK XS/INR WAIVED
INR: 2.4 — ABNORMAL HIGH (ref 0.9–1.1)
Prothrombin Time: 28.3 s

## 2016-01-15 NOTE — Patient Instructions (Signed)
[name] , Thank you for taking time to come for your Medicare Wellness Visit. I appreciate your ongoing commitment to your health goals. Please review the following plan we discussed and let me know if I can assist you in the future.   These are the goals we discussed:  Increase non-starchy vegetables - carrots, green bean, squash, zucchini, tomatoes, onions, peppers, spinach and other green leafy vegetables, cabbage, lettuce, cucumbers, asparagus, okra (not fried), eggplant Limit sugar and processed foods (cakes, cookies, ice cream, crackers and chips) Increase fresh fruit but limit serving sizes 1/2 cup or about the size of tennis or baseball Limit red meat to no more than 1-2 times per week (serving size about the size of your palm) Choose whole grains / lean proteins - whole wheat bread, quinoa, whole grain rice (1/2 cup), fish, chicken, [place] Avoid sugar and calorie containing beverages - soda, sweet tea and juice.  Choose water or unsweetened tea instead.      This is a list of the screening recommended for you and due dates:  Health Maintenance  Topic Date Due  . Flu Shot  01/23/2016  . DEXA scan (bone density measurement)  04/06/2016  . Tetanus Vaccine  04/26/2020  . Shingles Vaccine  Completed  . Pneumonia vaccines  Completed    Health Maintenance, Female Adopting a healthy lifestyle and getting preventive care can go a long way to promote health and wellness. Talk with your health care provider about what schedule of regular examinations is right for you. This is a good chance for you to check in with your provider about disease prevention and staying healthy. In between checkups, there are plenty of things you can do on your own. Experts have done a lot of research about which lifestyle changes and preventive measures are most likely to keep you healthy. Ask your health care provider for more information. WEIGHT AND DIET  Eat a healthy diet  Be sure to include  plenty of vegetables, fruits, low-fat dairy products, and lean protein.  Do not eat a lot of foods high in solid fats, added sugars, or salt.  Get regular exercise. This is one of the most important things you can do for your health.  Most adults should exercise for at least 150 minutes each week. The exercise should increase your heart rate and make you sweat (moderate-intensity exercise).  Most adults should also do strengthening exercises at least twice a week. This is in addition to the moderate-intensity exercise.  Maintain a healthy weight  Body mass index (BMI) is a measurement that can be used to identify possible weight problems. It estimates body fat based on height and weight. Your health care provider can help determine your BMI and help you achieve or maintain a healthy weight.  For females 73 years of age and older:   A BMI below 18.5 is considered underweight.  A BMI of 18.5 to 24.9 is normal.  A BMI of 25 to 29.9 is considered overweight.  A BMI of 30 and above is considered obese.  Watch levels of cholesterol and blood lipids  You should start having your blood tested for lipids and cholesterol at 76 years of age, then have this test every 5 years.  You may need to have your cholesterol levels checked more often if:  Your lipid or cholesterol levels are high.  You are older than 76 years of age.  You are at high risk for heart disease.  CANCER SCREENING  Lung Cancer  Lung cancer screening is recommended for adults 68-73 years old who are at high risk for lung cancer because of a history of smoking.  A yearly low-dose CT scan of the lungs is recommended for people who:  Currently smoke.  Have quit within the past 15 years.  Have at least a 30-pack-year history of smoking. A pack year is smoking an average of one pack of cigarettes a day for 1 year.  Yearly screening should continue until it has been 15 years since you quit.  Yearly screening  should stop if you develop a health problem that would prevent you from having lung cancer treatment.  Breast Cancer  Practice breast self-awareness. This means understanding how your breasts normally appear and feel.  It also means doing regular breast self-exams. Let your health care provider know about any changes, no matter how small.  If you are in your 20s or 30s, you should have a clinical breast exam (CBE) by a health care provider every 1-3 years as part of a regular health exam.  If you are 52 or older, have a CBE every year. Also consider having a breast X-ray (mammogram) every year.  If you have a family history of breast cancer, talk to your health care provider about genetic screening.  If you are at high risk for breast cancer, talk to your health care provider about having an MRI and a mammogram every year.  Breast cancer gene (BRCA) assessment is recommended for women who have family members with BRCA-related cancers. BRCA-related cancers include:  Breast.  Ovarian.  Tubal.  Peritoneal cancers.  Results of the assessment will determine the need for genetic counseling and BRCA1 and BRCA2 testing. Cervical Cancer Your health care provider may recommend that you be screened regularly for cancer of the pelvic organs (ovaries, uterus, and vagina). This screening involves a pelvic examination, including checking for microscopic changes to the surface of your cervix (Pap test). You may be encouraged to have this screening done every 3 years, beginning at age 60.  For women ages 10-65, health care providers may recommend pelvic exams and Pap testing every 3 years, or they may recommend the Pap and pelvic exam, combined with testing for human papilloma virus (HPV), every 5 years. Some types of HPV increase your risk of cervical cancer. Testing for HPV may also be done on women of any age with unclear Pap test results.  Other health care providers may not recommend any  screening for nonpregnant women who are considered low risk for pelvic cancer and who do not have symptoms. Ask your health care provider if a screening pelvic exam is right for you.  If you have had past treatment for cervical cancer or a condition that could lead to cancer, you need Pap tests and screening for cancer for at least 20 years after your treatment. If Pap tests have been discontinued, your risk factors (such as having a new sexual partner) need to be reassessed to determine if screening should resume. Some women have medical problems that increase the chance of getting cervical cancer. In these cases, your health care provider may recommend more frequent screening and Pap tests. Colorectal Cancer  This type of cancer can be detected and often prevented.  Routine colorectal cancer screening usually begins at 76 years of age and continues through 76 years of age.  Your health care provider may recommend screening at an earlier age if you have risk factors for colon cancer.  Your health care provider may also recommend using home test kits to check for hidden blood in the stool.  A small camera at the end of a tube can be used to examine your colon directly (sigmoidoscopy or colonoscopy). This is done to check for the earliest forms of colorectal cancer.  Routine screening usually begins at age 80.  Direct examination of the colon should be repeated every 5-10 years through 76 years of age. However, you may need to be screened more often if early forms of precancerous polyps or small growths are found. Skin Cancer  Check your skin from head to toe regularly.  Tell your health care provider about any new moles or changes in moles, especially if there is a change in a mole's shape or color.  Also tell your health care provider if you have a mole that is larger than the size of a pencil eraser.  Always use sunscreen. Apply sunscreen liberally and repeatedly throughout the  day.  Protect yourself by wearing long sleeves, pants, a wide-brimmed hat, and sunglasses whenever you are outside. HEART DISEASE, DIABETES, AND HIGH BLOOD PRESSURE   High blood pressure causes heart disease and increases the risk of stroke. High blood pressure is more likely to develop in:  People who have blood pressure in the high end of the normal range (130-139/85-89 mm Hg).  People who are overweight or obese.  People who are African American.  If you are 45-25 years of age, have your blood pressure checked every 3-5 years. If you are 13 years of age or older, have your blood pressure checked every year. You should have your blood pressure measured twice--once when you are at a hospital or clinic, and once when you are not at a hospital or clinic. Record the average of the two measurements. To check your blood pressure when you are not at a hospital or clinic, you can use:  An automated blood pressure machine at a pharmacy.  A home blood pressure monitor.  If you are between 69 years and 49 years old, ask your health care provider if you should take aspirin to prevent strokes.  Have regular diabetes screenings. This involves taking a blood sample to check your fasting blood sugar level.  If you are at a normal weight and have a low risk for diabetes, have this test once every three years after 76 years of age.  If you are overweight and have a high risk for diabetes, consider being tested at a younger age or more often. PREVENTING INFECTION  Hepatitis B  If you have a higher risk for hepatitis B, you should be screened for this virus. You are considered at high risk for hepatitis B if:  You were born in a country where hepatitis B is common. Ask your health care provider which countries are considered high risk.  Your parents were born in a high-risk country, and you have not been immunized against hepatitis B (hepatitis B vaccine).  You have HIV or AIDS.  You use needles  to inject street drugs.  You live with someone who has hepatitis B.  You have had sex with someone who has hepatitis B.  You get hemodialysis treatment.  You take certain medicines for conditions, including cancer, organ transplantation, and autoimmune conditions. Hepatitis C  Blood testing is recommended for:  Everyone born from [birth year] through 1966.  Anyone with known risk factors for hepatitis C. Sexually transmitted infections (STIs)  You should be screened for sexually  transmitted infections (STIs) including gonorrhea and chlamydia if:  You are sexually active and are younger than 76 years of age.  You are older than 76 years of age and your health care provider tells you that you are at risk for this type of infection.  Your sexual activity has changed since you were last screened and you are at an increased risk for chlamydia or gonorrhea. Ask your health care provider if you are at risk.  If you do not have HIV, but are at risk, it may be recommended that you take a prescription medicine daily to prevent HIV infection. This is called pre-exposure prophylaxis (PrEP). You are considered at risk if:  You are sexually active and do not regularly use condoms or know the HIV status of your partner(s).  You take drugs by injection.  You are sexually active with a partner who has HIV. Talk with your health care provider about whether you are at high risk of being infected with HIV. If you choose to begin PrEP, you should first be tested for HIV. You should then be tested every 3 months for as long as you are taking PrEP.  PREGNANCY   If you are premenopausal and you may become pregnant, ask your health care provider about preconception counseling.  If you may become pregnant, take 400 to 800 micrograms (mcg) of folic acid every day.  If you want to prevent pregnancy, talk to your health care provider about birth control (contraception). OSTEOPOROSIS AND MENOPAUSE    Osteoporosis is a disease in which the bones lose minerals and strength with aging. This can result in serious bone fractures. Your risk for osteoporosis can be identified using a bone density scan.  If you are 31 years of age or older, or if you are at risk for osteoporosis and fractures, ask your health care provider if you should be screened.  Ask your health care provider whether you should take a calcium or vitamin D supplement to lower your risk for osteoporosis.  Menopause may have certain physical symptoms and risks.  Hormone replacement therapy may reduce some of these symptoms and risks. Talk to your health care provider about whether hormone replacement therapy is right for you.  HOME CARE INSTRUCTIONS   Schedule regular health, dental, and eye exams.  Stay current with your immunizations.   Do not use any tobacco products including cigarettes, chewing tobacco, or electronic cigarettes.  If you are pregnant, do not drink alcohol.  If you are breastfeeding, limit how much and how often you drink alcohol.  Limit alcohol intake to no more than 1 drink per day for nonpregnant women. One drink equals 12 ounces of beer, 5 ounces of wine, or 1 ounces of hard liquor.  Do not use street drugs.  Do not share needles.  Ask your health care provider for help if you need support or information about quitting drugs.  Tell your health care provider if you often feel depressed.  Tell your health care provider if you have ever been abused or do not feel safe at home.   This information is not intended to replace advice given to you by your health care provider. Make sure you discuss any questions you have with your health care provider.   Document Released: 12/24/2010 Document Revised: 07/01/2014 Document Reviewed: 05/12/2013 Elsevier Interactive Patient Education [employer].

## 2016-01-15 NOTE — Progress Notes (Signed)
Subjective:   Virginia Young is a 75 y.o. female who presents for a subsequent Medicare Annual Wellness Visit and to have INR checked today.   Virginia Young is divorced,  She lives alone. She has 3 grown children.  Her daughter, Virginia Young lives close by.  Review of Systems  Review of Systems  Constitutional: Negative.   HENT: Negative.   Eyes: Negative.   Respiratory: Negative.   Cardiovascular: Negative.   Gastrointestinal: Negative.   Genitourinary: Negative.   Musculoskeletal: Positive for joint pain (knee).  Skin: Negative.   Neurological: Negative.   Endo/Heme/Allergies: Negative.   Psychiatric/Behavioral: Negative.      Current Medications (verified) Outpatient Encounter Prescriptions as of 01/15/2016  Medication Sig  . albuterol (PROVENTIL HFA;VENTOLIN HFA) 108 (90 Base) MCG/ACT inhaler Inhale 2 puffs into the lungs every 6 (six) hours as needed for wheezing or shortness of breath.  Marland Kitchen alendronate (FOSAMAX) 70 MG tablet TAKE 1 TABLET ONCE A WEEK WITH A FULL GLASS OF WATER ON AN EMPTY STOMACH  . amiodarone (PACERONE) 200 MG tablet TAKE 1 TABLET (200 MG TOTAL) BY MOUTH DAILY.  . Calcium Citrate-Vitamin D (CALCIUM CITRATE CHEWY BITE) 500-500 MG-UNIT CHEW Chew 2 each by mouth daily.  . Cholecalciferol (VITAMIN D3) 2000 UNITS TABS Take 1 tablet by mouth daily.  Marland Kitchen docusate sodium (COLACE) 100 MG capsule Take 1 capsule (100 mg total) by mouth 2 (two) times daily.  . fluticasone (FLONASE) 50 MCG/ACT nasal spray Place 2 sprays into both nostrils daily.  . meclizine (ANTIVERT) 25 MG tablet Take 1 tablet (25 mg total) by mouth 3 (three) times daily as needed for dizziness.  Marland Kitchen omeprazole (PRILOSEC) 40 MG capsule TAKE 1 CAPSULE (40 MG TOTAL) BY MOUTH DAILY.  Marland Kitchen warfarin (COUMADIN) 5 MG tablet TAKE 1/2 TO 1 TABLET DAILY AS DIRECTED   No facility-administered encounter medications on file as of 01/15/2016.     Allergies (verified) Lipitor [atorvastatin calcium]   History: Past  Medical History:  Diagnosis Date  . Allergy   . Atrial fibrillation (HCC)    a. coumadin;  b. Amiodarone  . Cataract   . Chest pain 03/2012   a. Lex MV 10/13:  EF 64%, dist ant and apical defect sugg of soft tissue atten, no ischemia  . Chronic systolic heart failure ([place])   . Depression   . GERD (gastroesophageal reflux disease)   . HLD (hyperlipidemia)   . Incontinence of urine   . Mild mitral regurgitation   . MS (multiple sclerosis) ([place])   . NICM (nonischemic cardiomyopathy) ([place])    a. neg CLite in 2003;  b. EF 40-45% in past;   c.  Echo 12/11: EF 35-40%, mild MR, mild LAE, mild RAE, small pericardial effusion   . Osteoarthritis   . Osteoporosis   . Stasis ulcer ([place])   . Uterine prolapse   . Vaginal prolapse without uterine prolapse   . Varicose veins    Past Surgical History:  Procedure Laterality Date  . ANTERIOR AND POSTERIOR VAGINAL REPAIR W/ SACROSPINOUS LIGAMENT SUSPENSION  2016   [facility]  . BLADDER SURGERY    . cataract surgery Bilateral   . EYE SURGERY    . INGUINAL HERNIA REPAIR     right  . LIPOMA EXCISION     h/o removed from upper back x 2   Family History  Problem Relation Age of Onset  . Diabetes Father 30    diabetes and syncope  . Heart attack Father   .  Heart attack Sister   . Heart attack Brother     all 5 brothers had MIs  . Stroke Brother   . Cancer Sister     bone  . Diabetes Sister   . Cancer Sister   . Diabetes Sister   . Heart attack Brother   . Dementia Mother   . Dementia Brother   . CAD    . Diabetes Daughter   . Colon cancer Neg Hx    Social History   Occupational History  . retired Retired   Social History Main Topics  . Smoking status: Never Smoker  . Smokeless tobacco: Never Used  . Alcohol use No  . Drug use: No  . Sexual activity: Not Currently    Do you feel safe at home?  Yes Are there smokers in your home (other than you)? No  Dietary issues and exercise activities: Current Exercise Habits: Home exercise  routine, Type of exercise: walking, Time (Minutes): 10, Frequency (Times/Week): 3, Weekly Exercise (Minutes/Week): 30, Intensity: Mild, Exercise limited by: orthopedic condition(s)  Current Dietary habits:  No special diet.  Breakfast - cereal - special K , raisin bran, uses 2% milk Lunch - sandwich Supper - salad; hamburger Snacks - watermelon, peaches, bananas Drinks - water  Objective:    Today's Vitals   01/15/16 0937  BP: 124/82  Pulse: 68  Weight: 186 lb (84.4 kg)  Height: 5' 8.5" (1.74 m)  PainSc: 2   PainLoc: Knee   Body mass index is 27.87 kg/m.   INR was 2.4 in office today  Activities of Daily Living In your present state of health, do you have any difficulty performing the following activities: 01/15/2016  Hearing? N  Vision? N  Difficulty concentrating or making decisions? Y  Walking or climbing stairs? Y  Dressing or bathing? N  Doing errands, shopping? N  Preparing Food and eating ? N  Using the Toilet? N  In the past six months, have you accidently leaked urine? N  Do you have problems with loss of bowel control? N  Managing your Medications? N  Managing your Finances? Y  Housekeeping or managing your Housekeeping? N  Some recent data might be hidden     Cardiac Risk Factors include: advanced age (>59men, >27 women);dyslipidemia;family history of premature cardiovascular disease;sedentary lifestyle  Depression Screen PHQ 2/9 Scores 01/15/2016 09/11/2015 06/29/2015 01/13/2015  PHQ - 2 Score 1 0 0 1  PHQ- 9 Score 2 - - -     Fall Risk Fall Risk  01/15/2016 09/11/2015 06/29/2015 01/13/2015 04/07/2014  Falls in the past year? No Yes Yes No No  Number falls in past yr: - 2 or more 2 or more - -  Injury with Fall? - No No - -  Risk Factor Category  - - - - -  Risk for fall due to : - - - - -  Risk for fall due to (comments): - - - - -    Cognitive Function: MMSE - Mini Mental State Exam 01/15/2016 01/13/2015  Orientation to time 5 2  Orientation to Place 5  4  Registration 3 3  Attention/ Calculation 2 4  Recall 0 3  Language- name 2 objects 2 1  Language- repeat 1 1  Language- follow 3 step command 3 3  Language- read & follow direction 1 1  Write a sentence 1 1  Copy design 0 0  Total score 23 23    Immunizations and Health Maintenance Immunization History  Administered Date(s) Administered  . Influenza Split 04/03/2012  . Influenza Whole 04/30/2006, 04/17/2010, 04/25/2011  . Influenza, High Dose Seasonal PF 03/20/2015  . Influenza,inj,Quad PF,36+ Mos 03/23/2013, 03/30/2014  . Pneumococcal Conjugate-13 01/13/2015  . Pneumococcal Polysaccharide-23 02/02/2009  . Pneumococcal-Unspecified 05/01/2011  . Td 04/26/2010  . Zoster 04/26/2010   There are no preventive care reminders to display for this patient.  Patient Care Team: Timmothy Euler, MD as PCP - General (Family Medicine) Fay Records, MD (Cardiology)  Indicate any recent Medical Services you may have received from other than [facility] providers in the past year (date may be approximate).    Assessment:    Annual Wellness Visit  Therapeutic anticoagulation Mild Cognitive Impairment - stable MMSE  Screening Tests Health Maintenance  Topic Date Due  . INFLUENZA VACCINE  01/23/2016  . DEXA SCAN  04/06/2016  . TETANUS/TDAP  04/26/2020  . ZOSTAVAX  Completed  . PNA vac Low Risk Adult  Completed        Plan:   During the course of the visit Virginia Young was educated and counseled about the following appropriate screening and preventive services:   Vaccines to include Pneumoccal, Influenza, Td, Zostavax - current UTD on all vaccines. Reminded to get influenza vaccines in Fall  Colorectal cancer screening - UTD  Cardiovascular disease screening - last EKG 2015;  Last ECHO 2014.  Patient's last visit with cardio was 2014.  Will discuss need for follow up with PCP  Diabetes screening - UTD; last FBG was 95  Bone Denisty / Osteoporosis Screening - UTD, next due to  recheck DEXA 04/06/2016 or after.  Continue alendronate 70mg  q week for now  Mammogram -patient declined  PAP - no longer required  Sent request for handicap placard / sticker  Glaucoma screening / Eye Exam - UTD  Nutrition counseling - discussed eating vegetables, lean proteins and whole grains.   Advanced Directives - information provided  Physical Activity - due to knee pain will  Recommend referral to ortho before recommend starting exercise.   Appt with PCP for 1 month.  (3 month follow up and protime); 2 month to see me for protime  Orders Placed This Encounter  Procedures  . Ambulatory referral to Orthopedic Surgery    Referral Priority:   Routine    Referral Type:   Surgical    Referral Reason:   Specialty Services Required    Requested Specialty:   Orthopedic Surgery    Number of Visits Requested:   1     Patient Instructions (the written plan) were given to the patient.   Cherre Robins, PharmD   01/15/2016

## 2016-01-16 ENCOUNTER — Encounter: Payer: Self-pay | Admitting: Pharmacist

## 2016-01-23 NOTE — Telephone Encounter (Signed)
Upfront, pt aware 

## 2016-01-31 DIAGNOSIS — M1711 Unilateral primary osteoarthritis, right knee: Secondary | ICD-10-CM | POA: Diagnosis not present

## 2016-01-31 DIAGNOSIS — M25562 Pain in left knee: Secondary | ICD-10-CM | POA: Diagnosis not present

## 2016-01-31 DIAGNOSIS — M2241 Chondromalacia patellae, right knee: Secondary | ICD-10-CM | POA: Diagnosis not present

## 2016-01-31 DIAGNOSIS — M25561 Pain in right knee: Secondary | ICD-10-CM | POA: Diagnosis not present

## 2016-02-14 DIAGNOSIS — M2242 Chondromalacia patellae, left knee: Secondary | ICD-10-CM | POA: Diagnosis not present

## 2016-02-14 DIAGNOSIS — M1712 Unilateral primary osteoarthritis, left knee: Secondary | ICD-10-CM | POA: Diagnosis not present

## 2016-02-14 DIAGNOSIS — M1711 Unilateral primary osteoarthritis, right knee: Secondary | ICD-10-CM | POA: Diagnosis not present

## 2016-02-14 DIAGNOSIS — M25562 Pain in left knee: Secondary | ICD-10-CM | POA: Diagnosis not present

## 2016-02-19 ENCOUNTER — Encounter: Payer: Self-pay | Admitting: Family Medicine

## 2016-02-19 ENCOUNTER — Ambulatory Visit (INDEPENDENT_AMBULATORY_CARE_PROVIDER_SITE_OTHER): Payer: Medicare Other | Admitting: Family Medicine

## 2016-02-19 VITALS — BP 165/79 | HR 62 | Temp 96.7°F | Ht 68.5 in | Wt 186.2 lb

## 2016-02-19 DIAGNOSIS — I5022 Chronic systolic (congestive) heart failure: Secondary | ICD-10-CM | POA: Diagnosis not present

## 2016-02-19 DIAGNOSIS — I429 Cardiomyopathy, unspecified: Secondary | ICD-10-CM

## 2016-02-19 DIAGNOSIS — R0600 Dyspnea, unspecified: Secondary | ICD-10-CM

## 2016-02-19 DIAGNOSIS — R0609 Other forms of dyspnea: Secondary | ICD-10-CM

## 2016-02-19 DIAGNOSIS — M545 Low back pain, unspecified: Secondary | ICD-10-CM

## 2016-02-19 DIAGNOSIS — I10 Essential (primary) hypertension: Secondary | ICD-10-CM | POA: Insufficient documentation

## 2016-02-19 DIAGNOSIS — I428 Other cardiomyopathies: Secondary | ICD-10-CM

## 2016-02-19 MED ORDER — METOPROLOL SUCCINATE ER 25 MG PO TB24: 12.5000 mg | ORAL_TABLET | Freq: Every day | ORAL | 3 refills | Status: DC

## 2016-02-19 NOTE — Patient Instructions (Addendum)
Great to see you!  I have started a very low dose blood pressure medicine that may help with your heart.   Metoprolol, Start 1/2 pill once daily  I have also written a referral for you to go to the heart doctor.   PLease try aspercreme as needed for your back pain.

## 2016-02-19 NOTE — Progress Notes (Signed)
   HPI  Patient presents today here is shortness of breath and pain.  Patient states that she has continued shortness of breath with activity. She states that over the last several months since beginning slightly worse. She denies any orthopnea, previously she reported stable 2 pillow orthopnea.  She denies any chest pain, she states that her exercise tolerance is about the same as it has been for the last several months. She denies any fever, chills, or other symptoms of current illness.  Varicose veins seem stable, and at times she has tenderness to palpation of certain ones, however at this time she's doing very well.  Her leg swelling is stable    PMH: Smoking status noted ROS: Per HPI  Objective: BP (!) 165/79   Pulse 62   Temp (!) 96.7 F (35.9 C)   Ht 5' 8.5" (1.74 m)   Wt 186 lb 3.2 oz (84.5 kg)   SpO2 99%   BMI 27.90 kg/m  Gen: NAD, alert, cooperative with exam HEENT: NCAT CV: RRR, good S1/S2, no murmur Resp: CTABL, no wheezes, non-labored Abd: SNTND, BS present, no guarding or organomegaly Ext: No leg swelling, , Neuro: Alert and oriented, No gross deficits  Assessment and plan:  # Dyspnea on exertion, history of NICM. Patient breathing easily today, denies any chest pain. She states that over the last 3-4 months her symptoms have gotten worse, on review of the chart we discussed very similar issues almost 9 months ago. Refer to cardiology Last stress [id number]  # CHF, systolic Mild systolic dysfunction with last ejection fraction of 40% She appears euvolemic today Does not describe any symptoms consistent with worsening CHF Starting beta blocker as below  # Hypertension Starting very low dose beta blocker, 12.5 mg of metoprolol succinate Patient has history of orthostatic hypotension, titrate slowly She is very anxious appearing today on exam, primarily about getting her blood pressure checked after our discussion Manual recheck with not much change BP is  intermittently high, possibly due to only white coat HTN  # Back pain Musculoskeletal back pain with no sciatica Recommended continued over-the-counter Tylenol, trial of Aspercreme as well No red flags    Orders Placed This Encounter  Procedures  . Ambulatory referral to Cardiology    Referral Priority:   Routine    Referral Type:   Consultation    Referral Reason:   Specialty Services Required    Requested Specialty:   Cardiology    Number of Visits Requested:   1    Meds ordered this encounter  Medications  . metoprolol succinate (TOPROL-XL) 25 MG 24 hr tablet    Sig: Take 0.5 tablets (12.5 mg total) by mouth daily.    Dispense:  30 tablet    Refill:  [place], MD [facility] 02/19/2016, 10:20 AM

## 2016-03-01 ENCOUNTER — Other Ambulatory Visit: Payer: Self-pay | Admitting: Family Medicine

## 2016-03-04 ENCOUNTER — Encounter: Payer: Self-pay | Admitting: Pharmacist

## 2016-03-05 ENCOUNTER — Telehealth: Payer: Self-pay | Admitting: Family Medicine

## 2016-03-05 NOTE — Telephone Encounter (Signed)
Patient cancelled her appointment yesterday.   She has appt for flu vaccine 03/12/16.  She is in need of INR so scheduled appt for same day as flu vaccine.  Patient aware of appointments.

## 2016-03-12 ENCOUNTER — Ambulatory Visit (INDEPENDENT_AMBULATORY_CARE_PROVIDER_SITE_OTHER): Payer: Medicare Other | Admitting: Pharmacist

## 2016-03-12 ENCOUNTER — Encounter: Payer: Self-pay | Admitting: Pharmacist

## 2016-03-12 ENCOUNTER — Ambulatory Visit (INDEPENDENT_AMBULATORY_CARE_PROVIDER_SITE_OTHER): Payer: Medicare Other

## 2016-03-12 DIAGNOSIS — Z5181 Encounter for therapeutic drug level monitoring: Secondary | ICD-10-CM | POA: Diagnosis not present

## 2016-03-12 DIAGNOSIS — I4891 Unspecified atrial fibrillation: Secondary | ICD-10-CM

## 2016-03-12 DIAGNOSIS — Z23 Encounter for immunization: Secondary | ICD-10-CM

## 2016-03-12 LAB — COAGUCHEK XS/INR WAIVED
INR: 1.8 — ABNORMAL HIGH (ref 0.9–1.1)
Prothrombin Time: 21.7 s

## 2016-03-20 ENCOUNTER — Encounter: Payer: Self-pay | Admitting: Family Medicine

## 2016-03-20 ENCOUNTER — Ambulatory Visit (INDEPENDENT_AMBULATORY_CARE_PROVIDER_SITE_OTHER): Payer: Medicare Other | Admitting: Family Medicine

## 2016-03-20 VITALS — BP 145/76 | HR 68 | Temp 97.9°F | Ht 68.5 in | Wt 186.5 lb

## 2016-03-20 DIAGNOSIS — J209 Acute bronchitis, unspecified: Secondary | ICD-10-CM

## 2016-03-20 MED ORDER — HYDROCODONE-HOMATROPINE 5-1.5 MG/5ML PO SYRP: 5.0000 mL | ORAL_SOLUTION | Freq: Four times a day (QID) | ORAL | 0 refills | Status: DC | PRN

## 2016-03-20 MED ORDER — DOXYCYCLINE HYCLATE 100 MG PO TABS: 100.0000 mg | ORAL_TABLET | Freq: Two times a day (BID) | ORAL | 0 refills | Status: DC

## 2016-03-20 NOTE — Progress Notes (Signed)
BP (!) 145/76   Pulse 68   Temp 97.9 F (36.6 C) (Oral)   Ht 5' 8.5" (1.74 m)   Wt 186 lb 8 oz (84.6 kg)   BMI 27.94 kg/m    Subjective:    Patient ID: Virginia Young, female    DOB: [birth year], 75 y.o.   MRN: [id number]  HPI: Virginia Young is a 75 y.o. female presenting on 03/20/2016 for Sinusitis (nasal congestion and runny nose); Cough (patient states she received high dose flu shot last week and has been coughing since); and Chest congestion   HPI Cough and congestion Patient comes in today with complaints of cough and congestion that been going on for about the past week. She says she was in here a week ago and got her high-dose flu shot and that same day also developed a cough and chest congestion. She has been trying guaifenesin and Flonase to help with the symptoms. They have been helping some but not completely. She feels like the drainage is starting to get down into her chest. She denies any fevers or chills or shortness of breath or wheezing. She has been having sinus pressure and congestion and nasal drainage as well.  Relevant past medical, surgical, family and social history reviewed and updated as indicated. Interim medical history since our last visit reviewed. Allergies and medications reviewed and updated.  Review of Systems  Constitutional: Negative for chills and fever.  HENT: Positive for congestion, postnasal drip, rhinorrhea, sinus pressure, sneezing and sore throat. Negative for ear discharge and ear pain.   Eyes: Negative for pain, redness and visual disturbance.  Respiratory: Positive for cough. Negative for chest tightness, shortness of breath and wheezing.   Cardiovascular: Negative for chest pain and leg swelling.  Genitourinary: Negative for difficulty urinating and dysuria.  Musculoskeletal: Negative for back pain and gait problem.  Skin: Negative for rash.  Neurological: Negative for light-headedness and headaches.  Psychiatric/Behavioral:  Negative for agitation and behavioral problems.  All other systems reviewed and are negative.   Per HPI unless specifically indicated above     Objective:    BP (!) 145/76   Pulse 68   Temp 97.9 F (36.6 C) (Oral)   Ht 5' 8.5" (1.74 m)   Wt 186 lb 8 oz (84.6 kg)   BMI 27.94 kg/m   Wt Readings from Last 3 Encounters:  03/20/16 186 lb 8 oz (84.6 kg)  02/19/16 186 lb 3.2 oz (84.5 kg)  01/15/16 186 lb (84.4 kg)    Physical Exam  Constitutional: She is oriented to person, place, and time. She appears well-developed and well-nourished. No distress.  HENT:  Right Ear: Tympanic membrane, external ear and ear canal normal.  Left Ear: Tympanic membrane, external ear and ear canal normal.  Nose: Mucosal edema and rhinorrhea present. No epistaxis. Right sinus exhibits no maxillary sinus tenderness and no frontal sinus tenderness. Left sinus exhibits no maxillary sinus tenderness and no frontal sinus tenderness.  Mouth/Throat: Uvula is midline and mucous membranes are normal. Posterior oropharyngeal edema and posterior oropharyngeal erythema present. No oropharyngeal exudate or tonsillar abscesses.  Eyes: Conjunctivae are normal.  Neck: Neck supple. No thyromegaly present.  Cardiovascular: Normal rate, regular rhythm, normal heart sounds and intact distal pulses.   No murmur heard. Pulmonary/Chest: Effort normal and breath sounds normal. No respiratory distress. She has no wheezes. She has no rales.  Musculoskeletal: Normal range of motion. She exhibits no edema or tenderness.  Lymphadenopathy:    She  has no cervical adenopathy.  Neurological: She is alert and oriented to person, place, and time. Coordination normal.  Skin: Skin is warm and dry. No rash noted. She is not diaphoretic.  Psychiatric: She has a normal mood and affect. Her behavior is normal.  Vitals reviewed.     Assessment & Plan:   Problem List Items Addressed This Visit    None    Visit Diagnoses    Acute  bronchitis, unspecified organism    -  Primary   Relevant Medications   doxycycline (VIBRA-TABS) 100 MG tablet   HYDROcodone-homatropine (HYCODAN) 5-1.5 MG/5ML syrup       Follow up plan: Return if symptoms worsen or fail to improve.  Counseling provided for all of the vaccine components No orders of the defined types were placed in this encounter.   Caryl Pina, MD [facility] 03/20/2016, 9:49 AM

## 2016-03-25 ENCOUNTER — Other Ambulatory Visit: Payer: Self-pay | Admitting: Family Medicine

## 2016-03-25 ENCOUNTER — Encounter: Payer: Medicare Other | Admitting: Pharmacist

## 2016-03-26 ENCOUNTER — Encounter: Payer: Self-pay | Admitting: Family Medicine

## 2016-03-26 ENCOUNTER — Encounter: Payer: Self-pay | Admitting: Pharmacist

## 2016-03-26 ENCOUNTER — Ambulatory Visit (INDEPENDENT_AMBULATORY_CARE_PROVIDER_SITE_OTHER): Payer: Medicare Other | Admitting: Family Medicine

## 2016-03-26 ENCOUNTER — Ambulatory Visit (INDEPENDENT_AMBULATORY_CARE_PROVIDER_SITE_OTHER): Payer: Medicare Other

## 2016-03-26 VITALS — BP 115/71 | HR 73 | Temp 97.1°F | Ht 68.5 in | Wt 182.5 lb

## 2016-03-26 DIAGNOSIS — J209 Acute bronchitis, unspecified: Secondary | ICD-10-CM

## 2016-03-26 DIAGNOSIS — I4891 Unspecified atrial fibrillation: Secondary | ICD-10-CM

## 2016-03-26 MED ORDER — ONDANSETRON 4 MG PO TBDP: 4.0000 mg | ORAL_TABLET | Freq: Four times a day (QID) | ORAL | 0 refills | Status: DC | PRN

## 2016-03-26 MED ORDER — AMOXICILLIN-POT CLAVULANATE 875-125 MG PO TABS: 1.0000 | ORAL_TABLET | Freq: Two times a day (BID) | ORAL | 0 refills | Status: DC

## 2016-03-26 NOTE — Addendum Note (Signed)
Addended by: Claretta Fraise on: 03/26/2016 03:19 PM   Modules accepted: Orders

## 2016-03-26 NOTE — Progress Notes (Addendum)
Subjective:  Patient ID: Virginia Young, female    DOB: 02-05-1941  Age: 75 y.o. MRN: [id number]  CC: Nausea (pt was seen a week ago with Dr.Dettinger and given given Doxy and Hycodan but she states she is feeling worse.)   HPI Virginia Young presents for Continued cough. It remains productive. Occasional purulence. She has dyspnea on exertion. She is also having moderate nausea with the multiple episodes of vomiting over the last 2-3 days. She had been tried on doxycycline for the cough and starting 6 days ago. However, those symptoms have continued and worsened.   History Virginia Young has a past medical history of Allergy; Atrial fibrillation ([place]); Cataract; Chest pain (03/2012); Chronic systolic heart failure ([place]); Depression; GERD (gastroesophageal reflux disease); HLD (hyperlipidemia); Incontinence of urine; Mild mitral regurgitation; MS (multiple sclerosis) ([place]); NICM (nonischemic cardiomyopathy) ([place]); Osteoarthritis; Osteoporosis; Stasis ulcer ([place]); Uterine prolapse; Vaginal prolapse without uterine prolapse; and Varicose veins.   She has a past surgical history that includes Lipoma excision; Inguinal hernia repair; cataract surgery (Bilateral); Eye surgery; Anterior and posterior vaginal repair w/ sacrospinous ligament suspension (2016); and Bladder surgery.   Her family history includes Cancer in her sister and sister; Dementia in her brother and mother; Diabetes in her daughter, sister, and sister; Diabetes (age of onset: 77) in her father; Heart attack in her brother, brother, father, and sister; Stroke in her brother.She reports that she has never smoked. She has never used smokeless tobacco. She reports that she does not drink alcohol or use drugs.    ROS Review of Systems  Constitutional: Negative for fever.  HENT: Negative for congestion, rhinorrhea and sore throat.   Respiratory: Negative for cough and shortness of breath.   Cardiovascular: Negative for chest pain and  palpitations.  Gastrointestinal: Positive for abdominal pain, nausea and vomiting.  Musculoskeletal: Negative for arthralgias and myalgias.    Objective:  BP 115/71   Pulse 73   Temp 97.1 F (36.2 C) (Oral)   Ht 5' 8.5" (1.74 m)   Wt 182 lb 8 oz (82.8 kg)   SpO2 98%   BMI 27.35 kg/m   BP Readings from Last 3 Encounters:  03/26/16 115/71  03/20/16 (!) 145/76  03/12/16 122/74    Wt Readings from Last 3 Encounters:  03/26/16 182 lb 8 oz (82.8 kg)  03/20/16 186 lb 8 oz (84.6 kg)  02/19/16 186 lb 3.2 oz (84.5 kg)     Physical Exam  Constitutional: She is oriented to person, place, and time. She appears well-nourished. She appears distressed (mild).  HENT:  Head: Normocephalic and atraumatic.  Eyes: Conjunctivae are normal. Pupils are equal, round, and reactive to light.  Neck: Normal range of motion. Neck supple. No thyromegaly present.  Cardiovascular: Normal rate.  An irregularly irregular rhythm present.  Occasional extrasystoles are present. Exam reveals no gallop, no distant heart sounds and no friction rub.   No murmur heard. Pulmonary/Chest: Effort normal. No respiratory distress. She has wheezes. She has no rales.  Abdominal: Soft. Bowel sounds are normal. She exhibits no distension. There is no tenderness.  Musculoskeletal: Normal range of motion. She exhibits deformity (dorsal kyphosis). She exhibits no edema or tenderness.  Lymphadenopathy:    She has no cervical adenopathy.  Neurological: She is alert and oriented to person, place, and time.  Skin: Skin is warm and dry.  Psychiatric: She has a normal mood and affect. Her behavior is normal. Judgment and thought content normal.     Lab Results  Component Value Date   WBC 8.5 05/15/2015   HGB 13.7 04/06/2014   HCT 43.1 05/15/2015   PLT 259 05/15/2015   GLUCOSE 95 05/15/2015   CHOL 196 05/15/2015   TRIG 74 05/15/2015   HDL 73 05/15/2015   LDLCALC 108 (H) 05/15/2015   ALT 16 05/15/2015   AST 17  05/15/2015   NA 137 05/15/2015   K 5.0 05/15/2015   CL 97 05/15/2015   CREATININE 0.99 05/15/2015   BUN 16 05/15/2015   CO2 26 05/15/2015   TSH 1.570 05/15/2015   INR 1.8 (H) 03/12/2016    US Pelvis Limited  Result Date: 09/20/2015 CLINICAL DATA:  Right groin pain.  Prior hernia repair. EXAM: LIMITED ULTRASOUND OF PELVIS TECHNIQUE: Limited transabdominal ultrasound examination of the pelvis was performed. COMPARISON:  Ultrasound 09/07/2014. FINDINGS: No cystic or solid abnormalities identified. No evidence of hernia. If symptoms persist IV and oral contrast enhanced CT can be obtained . IMPRESSION: Negative exam. Electronically Signed   By: Marcello Moores  Register   On: 09/20/2015 12:30    Assessment & Plan:   Virginia Young was seen today for nausea.  Diagnoses and all orders for this visit:  Acute bronchitis, unspecified organism -     DG Chest 2 View; Future  Atrial fibrillation, unspecified type ([place])  Other orders -     amoxicillin-clavulanate (AUGMENTIN) 875-125 MG tablet; Take 1 tablet by mouth 2 (two) times daily. Take all of this medication -     ondansetron (ZOFRAN-ODT) 4 MG disintegrating tablet; Take 1 tablet (4 mg total) by mouth every 6 (six) hours as needed for nausea or vomiting.    Chest x-ray shows old scarring based on radiology report.  I am having Virginia Young start on amoxicillin-clavulanate and ondansetron. I am also having her maintain her Vitamin D3, Calcium Citrate-Vitamin D, fluticasone, albuterol, omeprazole, meclizine, docusate sodium, alendronate, metoprolol succinate, warfarin, guaiFENesin, doxycycline, HYDROcodone-homatropine, and amiodarone.  Meds ordered this encounter  Medications  . amoxicillin-clavulanate (AUGMENTIN) 875-125 MG tablet    Sig: Take 1 tablet by mouth 2 (two) times daily. Take all of this medication    Dispense:  20 tablet    Refill:  0  . ondansetron (ZOFRAN-ODT) 4 MG disintegrating tablet    Sig: Take 1 tablet (4 mg total) by mouth  every 6 (six) hours as needed for nausea or vomiting.    Dispense:  24 tablet    Refill:  0     Follow-up: No Follow-up on file.  Claretta Fraise, M.D.

## 2016-04-07 ENCOUNTER — Other Ambulatory Visit: Payer: Self-pay | Admitting: Family Medicine

## 2016-04-16 NOTE — Progress Notes (Signed)
12 HPI car The patient presents for evaluation of atrial fibrillation and nonischemic cardiomyopathy. I have not seen her since 2015.  She has a mildly reduced ejection fraction of 40% followed up on echo last year. She has had fibrillation but has seemed to maintain sinus rhythm on amiodarone.  Since I saw her she has had increasing shortness of breath. She thinks this has been slowly progressive although she somewhat equivocal on this. She does seem to describe some intermittent PND. She occasionally sleeps in an easy chair she can't breathe well. She'll get short of breath doing activities such as vacuuming one room. She's not describing palpitations, presyncope or syncope. There's been no chest pressure, neck or arm discomfort. She's had steady weight gain over a few years and does describe some edema although today she is not demonstrating this.   Allergies  Allergen Reactions  . Lipitor [Atorvastatin Calcium]     myalgia    Current Outpatient Prescriptions  Medication Sig Dispense Refill  . albuterol (PROVENTIL HFA;VENTOLIN HFA) 108 (90 Base) MCG/ACT inhaler Inhale 2 puffs into the lungs every 6 (six) hours as needed for wheezing or shortness of breath. 1 Inhaler 0  . alendronate (FOSAMAX) 70 MG tablet TAKE 1 TABLET ONCE A WEEK WITH A FULL GLASS OF WATER ON AN EMPTY STOMACH 12 tablet 1  . amiodarone (PACERONE) 200 MG tablet TAKE 1 TABLET (200 MG TOTAL) BY MOUTH DAILY. 90 tablet 0  . Calcium Citrate-Vitamin D (CALCIUM CITRATE CHEWY BITE) 500-500 MG-UNIT CHEW Chew 2 each by mouth daily.    . Cholecalciferol (VITAMIN D3) 2000 UNITS TABS Take 1 tablet by mouth daily. 100 tablet 3  . docusate sodium (COLACE) 100 MG capsule Take 1 capsule (100 mg total) by mouth 2 (two) times daily. 200 capsule 0  . fluticasone (FLONASE) 50 MCG/ACT nasal spray Place 2 sprays into both nostrils daily. 16 g 5  . meclizine (ANTIVERT) 25 MG tablet Take 1 tablet (25 mg total) by mouth 3 (three) times daily as needed  for dizziness. 30 tablet 0  . metoprolol succinate (TOPROL-XL) 25 MG 24 hr tablet Take 0.5 tablets (12.5 mg total) by mouth daily. 30 tablet 3  . omeprazole (PRILOSEC) 40 MG capsule TAKE 1 CAPSULE (40 MG TOTAL) BY MOUTH DAILY. 90 capsule 2  . warfarin (COUMADIN) 5 MG tablet TAKE 1/2 TO 1 TABLET DAILY AS DIRECTED 90 tablet 1   No current facility-administered medications for this visit.     Past Medical History:  Diagnosis Date  . Allergy   . Atrial fibrillation ([facility])    a. coumadin;  b. Amiodarone  . Cataract   . Chest pain 03/2012   a. Lex MV 10/13:  EF 64%, dist ant and apical defect sugg of soft tissue atten, no ischemia  . Chronic systolic heart failure ([place])   . Depression   . GERD (gastroesophageal reflux disease)   . HLD (hyperlipidemia)   . Incontinence of urine   . Mild mitral regurgitation   . MS (multiple sclerosis) ([place])   . NICM (nonischemic cardiomyopathy) ([place])    a. neg CLite in 2003;  b. EF 40-45% in past;   c.  Echo 12/11: EF 35-40%, mild MR, mild LAE, mild RAE, small pericardial effusion   . Osteoarthritis   . Osteoporosis   . Stasis ulcer ([place])   . Uterine prolapse   . Vaginal prolapse without uterine prolapse   . Varicose veins     Past Surgical History:  Procedure Laterality Date  .  ANTERIOR AND POSTERIOR VAGINAL REPAIR W/ SACROSPINOUS LIGAMENT SUSPENSION  2016   [facility]  . BLADDER SURGERY    . cataract surgery Bilateral   . EYE SURGERY    . INGUINAL HERNIA REPAIR     right  . LIPOMA EXCISION     h/o removed from upper back x 2    ROS:   As stated in the HPI and negative for all other systems.  PHYSICAL EXAM BP 129/75   Pulse (!) 57   Ht 5' 8.5" (1.74 m)   Wt 186 lb (84.4 kg)   BMI 27.87 kg/m  GENERAL:  Well appearing HEENT:  Pupils equal round and reactive, fundi not visualized, oral mucosa unremarkable NECK:  No jugular venous distention, waveform within normal limits, carotid upstroke brisk and symmetric, no bruits, no thyromegaly LUNGS:   Clear to auscultation bilaterally BACK:  No CVA tenderness CHEST:  Unremarkable HEART:  PMI not displaced or sustained,S1 and S2 within normal limits, no S3, no S4, no clicks, no rubs, no murmurs ABD:  Flat, positive bowel sounds normal in frequency in pitch, no bruits, no rebound, no guarding, no midline pulsatile mass, no hepatomegaly, no splenomegaly EXT:  2 plus pulses throughout, no edema, no cyanosis no clubbing SKIN:  No rashes no nodules, varicose veins  Lab Results  Component Value Date   TSH 1.570 05/15/2015   ALT 16 05/15/2015   AST 17 05/15/2015   ALKPHOS 97 05/15/2015   BILITOT 0.3 05/15/2015   PROT 6.6 05/15/2015   ALBUMIN 4.1 05/15/2015    EKG:  Sinus rhythm, rate 57, left axis deviation, no acute ST-T wave changes.     ASSESSMENT AND PLAN  DYSPNEA:  This seems to be progressive although it is also on a chronic problem. I'm going to check blood work to include a BNP level. I'll also make sure she is not anemic and check a thyroid profile. She needs a repeat echocardiogram.  Given her dyspnea and amiodarone I'll have a low threshold pulmonary function testing.  Watching her walk around the office it does seem that she is get some orthopedic gait issues and probably deconditioning is a significant part of the problem. Of note her oxygen saturations were low 90s when she started walking around her office and then went to 89 with ambulation.   CARDIOMYOPATHY:  As above  ATRIAL FIB:  Virginia Young has a CHA2DS2 - VASc score of 3 with a risk of stroke of 3.2%. She tolerates warfarin and is comfortable with this and does not want to switch to a NOAC.  She needs a TSH and liver profile.

## 2016-04-17 ENCOUNTER — Encounter: Payer: Self-pay | Admitting: Cardiology

## 2016-04-17 ENCOUNTER — Ambulatory Visit (INDEPENDENT_AMBULATORY_CARE_PROVIDER_SITE_OTHER): Payer: Medicare Other | Admitting: Cardiology

## 2016-04-17 ENCOUNTER — Ambulatory Visit (INDEPENDENT_AMBULATORY_CARE_PROVIDER_SITE_OTHER): Payer: Medicare Other | Admitting: Pharmacist

## 2016-04-17 ENCOUNTER — Other Ambulatory Visit: Payer: Self-pay | Admitting: Cardiology

## 2016-04-17 VITALS — BP 129/75 | HR 57 | Ht 68.5 in | Wt 186.0 lb

## 2016-04-17 DIAGNOSIS — R0602 Shortness of breath: Secondary | ICD-10-CM | POA: Diagnosis not present

## 2016-04-17 DIAGNOSIS — Z5181 Encounter for therapeutic drug level monitoring: Secondary | ICD-10-CM

## 2016-04-17 DIAGNOSIS — Z79899 Other long term (current) drug therapy: Secondary | ICD-10-CM

## 2016-04-17 DIAGNOSIS — I10 Essential (primary) hypertension: Secondary | ICD-10-CM

## 2016-04-17 DIAGNOSIS — I4891 Unspecified atrial fibrillation: Secondary | ICD-10-CM

## 2016-04-17 DIAGNOSIS — I480 Paroxysmal atrial fibrillation: Secondary | ICD-10-CM

## 2016-04-17 DIAGNOSIS — I5022 Chronic systolic (congestive) heart failure: Secondary | ICD-10-CM

## 2016-04-17 LAB — COAGUCHEK XS/INR WAIVED
INR: 1.6 — ABNORMAL HIGH (ref 0.9–1.1)
Prothrombin Time: 19.7 s

## 2016-04-17 NOTE — Patient Instructions (Signed)
Medication Instructions:  The current medical regimen is effective;  continue present plan and medications.  Labwork: Please have blood work today (BNP, CMP, CBC and TSH)  Testing/Procedures: Your physician has requested that you have an echocardiogram. Echocardiography is a painless test that uses sound waves to create images of your heart. It provides your doctor with information about the size and shape of your heart and how well your heart's chambers and valves are working. This procedure takes approximately one hour. There are no restrictions for this procedure.  Follow-Up: Follow up in 2 months with Dr Percival Spanish.  If you need a refill on your cardiac medications before your next appointment, please call your pharmacy.  Thank you for choosing [facility]!!

## 2016-04-18 ENCOUNTER — Encounter: Payer: Self-pay | Admitting: *Deleted

## 2016-04-18 LAB — COMPREHENSIVE METABOLIC PANEL
ALT: 19 IU/L (ref 0–32)
AST: 22 IU/L (ref 0–40)
Albumin/Globulin Ratio: 1.9 (ref 1.2–2.2)
Albumin: 3.9 g/dL (ref 3.5–4.8)
Alkaline Phosphatase: 71 IU/L (ref 39–117)
BUN/Creatinine Ratio: 16 (ref 12–28)
BUN: 16 mg/dL (ref 8–27)
Bilirubin Total: 0.3 mg/dL (ref 0.0–1.2)
CO2: 27 mmol/L (ref 18–29)
Calcium: 8.7 mg/dL (ref 8.7–10.3)
Chloride: 98 mmol/L (ref 96–106)
Creatinine, Ser: 0.97 mg/dL (ref 0.57–1.00)
GFR calc Af Amer: 66 mL/min/{1.73_m2} (ref >59)
GFR calc non Af Amer: 57 mL/min/{1.73_m2} — ABNORMAL LOW (ref >59)
Globulin, Total: 2.1 g/dL (ref 1.5–4.5)
Glucose: 98 mg/dL (ref 65–99)
Potassium: 4.4 mmol/L (ref 3.5–5.2)
Sodium: 137 mmol/L (ref 134–144)
Total Protein: 6 g/dL (ref 6.0–8.5)

## 2016-04-18 LAB — CBC
Hematocrit: 38.9 % (ref 34.0–46.6)
Hemoglobin: 13.2 g/dL (ref 11.1–15.9)
MCH: 29 pg (ref 26.6–33.0)
MCHC: 33.9 g/dL (ref 31.5–35.7)
MCV: 86 fL (ref 79–97)
Platelets: 242 10*3/uL (ref 150–379)
RBC: 4.55 x10E6/uL (ref 3.77–5.28)
RDW: 15.2 % (ref 12.3–15.4)
WBC: 9.1 10*3/uL (ref 3.4–10.8)

## 2016-04-18 LAB — TSH: TSH: 1.49 u[IU]/mL (ref 0.450–4.500)

## 2016-04-18 LAB — PLEASE NOTE

## 2016-04-18 LAB — BRAIN NATRIURETIC PEPTIDE: BNP: 196.7 pg/mL — ABNORMAL HIGH (ref 0.0–100.0)

## 2016-04-25 DIAGNOSIS — M2241 Chondromalacia patellae, right knee: Secondary | ICD-10-CM | POA: Diagnosis not present

## 2016-04-25 DIAGNOSIS — M170 Bilateral primary osteoarthritis of knee: Secondary | ICD-10-CM | POA: Diagnosis not present

## 2016-04-25 DIAGNOSIS — M2242 Chondromalacia patellae, left knee: Secondary | ICD-10-CM | POA: Diagnosis not present

## 2016-04-25 DIAGNOSIS — M1711 Unilateral primary osteoarthritis, right knee: Secondary | ICD-10-CM | POA: Diagnosis not present

## 2016-04-25 DIAGNOSIS — M1712 Unilateral primary osteoarthritis, left knee: Secondary | ICD-10-CM | POA: Diagnosis not present

## 2016-05-01 DIAGNOSIS — M1712 Unilateral primary osteoarthritis, left knee: Secondary | ICD-10-CM | POA: Diagnosis not present

## 2016-05-01 DIAGNOSIS — M170 Bilateral primary osteoarthritis of knee: Secondary | ICD-10-CM | POA: Diagnosis not present

## 2016-05-01 DIAGNOSIS — M2242 Chondromalacia patellae, left knee: Secondary | ICD-10-CM | POA: Diagnosis not present

## 2016-05-01 DIAGNOSIS — M25561 Pain in right knee: Secondary | ICD-10-CM | POA: Diagnosis not present

## 2016-05-01 DIAGNOSIS — M25562 Pain in left knee: Secondary | ICD-10-CM | POA: Diagnosis not present

## 2016-05-03 ENCOUNTER — Other Ambulatory Visit: Payer: Self-pay | Admitting: *Deleted

## 2016-05-03 MED ORDER — FUROSEMIDE 20 MG PO TABS: 20.0000 mg | ORAL_TABLET | Freq: Every day | ORAL | 6 refills | Status: DC

## 2016-05-05 ENCOUNTER — Other Ambulatory Visit: Payer: Self-pay | Admitting: Pharmacist

## 2016-05-07 ENCOUNTER — Encounter: Payer: Self-pay | Admitting: Pharmacist

## 2016-05-07 ENCOUNTER — Other Ambulatory Visit (HOSPITAL_COMMUNITY): Payer: Medicare Other

## 2016-05-08 ENCOUNTER — Other Ambulatory Visit: Payer: Self-pay

## 2016-05-08 ENCOUNTER — Ambulatory Visit (HOSPITAL_COMMUNITY): Payer: Medicare Other | Attending: Cardiology

## 2016-05-08 DIAGNOSIS — R0602 Shortness of breath: Secondary | ICD-10-CM | POA: Insufficient documentation

## 2016-05-08 DIAGNOSIS — I480 Paroxysmal atrial fibrillation: Secondary | ICD-10-CM | POA: Diagnosis not present

## 2016-05-08 DIAGNOSIS — I5022 Chronic systolic (congestive) heart failure: Secondary | ICD-10-CM | POA: Insufficient documentation

## 2016-05-09 ENCOUNTER — Encounter: Payer: Medicare Other | Admitting: Pharmacist

## 2016-05-09 DIAGNOSIS — M170 Bilateral primary osteoarthritis of knee: Secondary | ICD-10-CM | POA: Diagnosis not present

## 2016-05-13 ENCOUNTER — Ambulatory Visit (INDEPENDENT_AMBULATORY_CARE_PROVIDER_SITE_OTHER): Payer: Medicare Other | Admitting: Pharmacist

## 2016-05-13 DIAGNOSIS — I4891 Unspecified atrial fibrillation: Secondary | ICD-10-CM | POA: Diagnosis not present

## 2016-05-13 DIAGNOSIS — Z5181 Encounter for therapeutic drug level monitoring: Secondary | ICD-10-CM

## 2016-05-13 LAB — COAGUCHEK XS/INR WAIVED
INR: 2.4 — ABNORMAL HIGH (ref 0.9–1.1)
Prothrombin Time: 28.7 s

## 2016-05-14 ENCOUNTER — Other Ambulatory Visit: Payer: Self-pay | Admitting: *Deleted

## 2016-05-14 DIAGNOSIS — R0600 Dyspnea, unspecified: Secondary | ICD-10-CM

## 2016-05-30 ENCOUNTER — Encounter: Payer: Self-pay | Admitting: Cardiology

## 2016-06-04 NOTE — Progress Notes (Signed)
HPI The patient presents for evaluation of atrial fibrillation and nonischemic cardiomyopathy.  She has a mildly reduced ejection fraction of 40% in 04/2016.   She has had fibrillation but has seemed to maintain sinus rhythm on amiodarone.  I saw her recently and she had increasing shortness of breath.  BNP was mildly elevated.  I added 20 mg Lasix daily.  However, she did not notice any improvement with this.  She still has SOB with mild activity.  She'll get short of breath doing activities such as vacuuming one room. She's not describing palpitations, presyncope or syncope. There's been no chest pressure, neck or arm discomfort.  She has no new edema.    Allergies  Allergen Reactions  . Lipitor [Atorvastatin Calcium]     myalgia    Current Outpatient Prescriptions  Medication Sig Dispense Refill  . albuterol (PROVENTIL HFA;VENTOLIN HFA) 108 (90 Base) MCG/ACT inhaler Inhale 2 puffs into the lungs every 6 (six) hours as needed for wheezing or shortness of breath. 1 Inhaler 0  . alendronate (FOSAMAX) 70 MG tablet TAKE 1 TABLET ONCE A WEEK WITH A FULL GLASS OF WATER ON AN EMPTY STOMACH 12 tablet 0  . amiodarone (PACERONE) 200 MG tablet TAKE 1 TABLET (200 MG TOTAL) BY MOUTH DAILY. 90 tablet 0  . Calcium Citrate-Vitamin D (CALCIUM CITRATE CHEWY BITE) 500-500 MG-UNIT CHEW Chew 2 each by mouth daily.    Marland Kitchen docusate sodium (COLACE) 100 MG capsule Take 1 capsule (100 mg total) by mouth 2 (two) times daily. 200 capsule 0  . fluticasone (FLONASE) 50 MCG/ACT nasal spray Place 2 sprays into both nostrils daily. 16 g 5  . furosemide (LASIX) 20 MG tablet Take 20 mg by mouth daily.    . meclizine (ANTIVERT) 25 MG tablet Take 1 tablet (25 mg total) by mouth 3 (three) times daily as needed for dizziness. 30 tablet 0  . metoprolol succinate (TOPROL-XL) 25 MG 24 hr tablet Take 0.5 tablets (12.5 mg total) by mouth daily. 30 tablet 3  . omeprazole (PRILOSEC) 40 MG capsule TAKE 1 CAPSULE (40 MG TOTAL) BY MOUTH  DAILY. 90 capsule 2  . warfarin (COUMADIN) 5 MG tablet TAKE 1/2 TO 1 TABLET DAILY AS DIRECTED 90 tablet 1   No current facility-administered medications for this visit.     Past Medical History:  Diagnosis Date  . Allergy   . Atrial fibrillation (HCC)    a. coumadin;  b. Amiodarone  . Cataract   . Chest pain 03/2012   a. Lex MV 10/13:  EF 64%, dist ant and apical defect sugg of soft tissue atten, no ischemia  . Chronic systolic heart failure ([place])   . Depression   . GERD (gastroesophageal reflux disease)   . HLD (hyperlipidemia)   . Incontinence of urine   . Mild mitral regurgitation   . MS (multiple sclerosis) ([place])   . NICM (nonischemic cardiomyopathy) ([place])    a. neg CLite in 2003;  b. EF 40-45% in past;   c.  Echo 12/11: EF 35-40%, mild MR, mild LAE, mild RAE, small pericardial effusion   . Osteoarthritis   . Osteoporosis   . Stasis ulcer ([place])   . Uterine prolapse   . Vaginal prolapse without uterine prolapse   . Varicose veins     Past Surgical History:  Procedure Laterality Date  . ANTERIOR AND POSTERIOR VAGINAL REPAIR W/ SACROSPINOUS LIGAMENT SUSPENSION  2016   [facility]  . BLADDER SURGERY    . cataract surgery Bilateral   .  EYE SURGERY    . INGUINAL HERNIA REPAIR     right  . LIPOMA EXCISION     h/o removed from upper back x 2    ROS:     As stated in the HPI and negative for all other systems.  PHYSICAL EXAM BP 120/68   Pulse 78   Ht 5\' 9"  (1.753 m)   Wt 186 lb (84.4 kg)   BMI 27.47 kg/m  GENERAL:  Well appearing NECK:  No jugular venous distention, waveform within normal limits, carotid upstroke brisk and symmetric, no bruits, no thyromegaly LUNGS:  Clear to auscultation bilaterally BACK:  No CVA tenderness CHEST:  Unremarkable HEART:  PMI not displaced or sustained,S1 and S2 within normal limits, no S3, no S4, no clicks, no rubs, no murmurs ABD:  Flat, positive bowel sounds normal in frequency in pitch, no bruits, no rebound, no guarding, no midline  pulsatile mass, no hepatomegaly, no splenomegaly EXT:  No edema, dependent rubor.    Lab Results  Component Value Date   TSH 1.490 04/17/2016   ALT 19 04/17/2016   AST 22 04/17/2016   ALKPHOS 71 04/17/2016   BILITOT 0.3 04/17/2016   PROT 6.0 04/17/2016   ALBUMIN 3.9 04/17/2016    ASSESSMENT AND PLAN  DYSPNEA:    This seems to be significant.    Of note her oxygen saturations were low 90s when she started walking around her office at the last appt and then went to 89 with ambulation.  Her EF was about the same.  She is not better with Lasix.  I am going to order PFTs.  If these are not revealing of an etiology I will like order a Lexiscan Myoview.  She might ultimately need a right heart cath.    CARDIOMYOPATHY:  As above  ATRIAL FIB:  [name] has a CHA2DS2 - VASc score of 3 with a risk of stroke of 3.2%. She tolerates warfarin and is comfortable with this and does not want to switch to a NOAC.  She needs a TSH and liver profile.

## 2016-06-05 ENCOUNTER — Ambulatory Visit (INDEPENDENT_AMBULATORY_CARE_PROVIDER_SITE_OTHER): Payer: Medicare Other | Admitting: Cardiology

## 2016-06-05 ENCOUNTER — Encounter: Payer: Self-pay | Admitting: Cardiology

## 2016-06-05 VITALS — BP 120/68 | HR 78 | Ht 69.0 in | Wt 186.0 lb

## 2016-06-05 DIAGNOSIS — I5022 Chronic systolic (congestive) heart failure: Secondary | ICD-10-CM

## 2016-06-05 DIAGNOSIS — R0602 Shortness of breath: Secondary | ICD-10-CM | POA: Diagnosis not present

## 2016-06-05 DIAGNOSIS — I481 Persistent atrial fibrillation: Secondary | ICD-10-CM | POA: Diagnosis not present

## 2016-06-05 DIAGNOSIS — I4819 Other persistent atrial fibrillation: Secondary | ICD-10-CM

## 2016-06-05 NOTE — Patient Instructions (Signed)
Medication Instructions:  The current medical regimen is effective;  continue present plan and medications.  Follow-Up: Follow up with Dr Percival Spanish after your pulmonary function test.  If you need a refill on your cardiac medications before your next appointment, please call your pharmacy.  Thank you for choosing [facility]!!

## 2016-06-06 ENCOUNTER — Encounter: Payer: Self-pay | Admitting: Cardiology

## 2016-06-18 ENCOUNTER — Telehealth: Payer: Self-pay | Admitting: *Deleted

## 2016-06-18 ENCOUNTER — Ambulatory Visit (HOSPITAL_COMMUNITY)
Admission: RE | Admit: 2016-06-18 | Discharge: 2016-06-18 | Disposition: A | Discharge status: Home / Self Care | Payer: Medicare Other | Source: Ambulatory Visit | Attending: Cardiology | Admitting: Cardiology

## 2016-06-18 DIAGNOSIS — R0600 Dyspnea, unspecified: Secondary | ICD-10-CM | POA: Insufficient documentation

## 2016-06-18 DIAGNOSIS — R942 Abnormal results of pulmonary function studies: Secondary | ICD-10-CM

## 2016-06-18 DIAGNOSIS — M1711 Unilateral primary osteoarthritis, right knee: Secondary | ICD-10-CM | POA: Diagnosis not present

## 2016-06-18 DIAGNOSIS — M1712 Unilateral primary osteoarthritis, left knee: Secondary | ICD-10-CM | POA: Diagnosis not present

## 2016-06-18 LAB — PULMONARY FUNCTION TEST
DL/VA % pred: 58 %
DL/VA: 3.14 ml/min/mmHg/L
DLCO unc % pred: 43 %
DLCO unc: 13.44 ml/min/mmHg
FEF 25-75 Post: 2.7 L/sec
FEF 25-75 Pre: 2.01 L/sec
FEF2575-%Change-Post: 34 %
FEF2575-%Pred-Post: 142 %
FEF2575-%Pred-Pre: 106 %
FEV1-%Change-Post: 4 %
FEV1-%Pred-Post: 89 %
FEV1-%Pred-Pre: 86 %
FEV1-Post: 2.29 L
FEV1-Pre: 2.2 L
FEV1FVC-%Change-Post: 1 %
FEV1FVC-%Pred-Pre: 105 %
FEV6-%Change-Post: 5 %
FEV6-%Pred-Post: 86 %
FEV6-%Pred-Pre: 82 %
FEV6-Post: 2.79 L
FEV6-Pre: 2.66 L
FEV6FVC-%Change-Post: 2 %
FEV6FVC-%Pred-Post: 102 %
FEV6FVC-%Pred-Pre: 100 %
FVC-%Change-Post: 2 %
FVC-%Pred-Post: 84 %
FVC-%Pred-Pre: 82 %
FVC-Post: 2.86 L
FVC-Pre: 2.8 L
Post FEV1/FVC ratio: 80 %
Post FEV6/FVC ratio: 98 %
Pre FEV1/FVC ratio: 79 %
Pre FEV6/FVC Ratio: 95 %
RV % pred: 77 %
RV: 1.99 L
TLC % pred: 79 %
TLC: 4.6 L

## 2016-06-18 MED ORDER — ALBUTEROL SULFATE (2.5 MG/3ML) 0.083% IN NEBU
2.5000 mg | INHALATION_SOLUTION | Freq: Once | RESPIRATORY_TRACT | Status: AC
  Administered 2016-06-18: 2.5 mg via RESPIRATORY_TRACT

## 2016-06-18 NOTE — Telephone Encounter (Signed)
-----   Message from Minus Breeding, MD sent at 06/18/2016  2:06 PM EST ----- Abnormal lung studies.  I would like for her to see a pulmonologist.  Please schedule this appt.  Call Ms. Garzon with the results and send results to Kenn File, MD

## 2016-06-18 NOTE — Telephone Encounter (Signed)
Referral for Pulmonologist ordered Send to scheduler to be schedule

## 2016-06-19 ENCOUNTER — Telehealth: Payer: Self-pay | Admitting: Cardiology

## 2016-06-19 NOTE — Telephone Encounter (Signed)
Pt daughter notified. Please call to schedule pulmonary appt.   Duplicate message see other message dated 06-18-16: Message from Minus Breeding, MD sent at 06/18/2016  2:06 PM EST ----- Abnormal lung studies.  I would like for her to see a pulmonologist.  Please schedule this appt.  Call Ms. Dewberry with the results and send results to Kenn File, MD

## 2016-06-19 NOTE — Telephone Encounter (Signed)
Per 06-05-16 office visit note: DYSPNEA:    This seems to be significant.    Of note her oxygen saturations were low 90s when she started walking around her office at the last appt and then went to 89 with ambulation.  Her EF was about the same.  She is not better with Lasix.  I am going to order PFTs.  If these are not revealing of an etiology I will like order a Lexiscan Myoview.  She might ultimately need a right heart cath.

## 2016-06-19 NOTE — Telephone Encounter (Signed)
I called and spoke with Virginia Young regarding appointment with pulmonary doctor that was requested by Dr. Percival Spanish.  Patient did not know she needed to see another physician and had me call her daughter with the appointment information.  [name] Sayers (daughter) knew nothing about the appointment and wants to know why we are sending her mom to a pulmonary doctor.   Please call.

## 2016-06-19 NOTE — Telephone Encounter (Signed)
Spoke with patient's daughter---Dr. Vaughan Browner (pulmonary)--Wednesday 07/03/16 at 3:00pm---[address] 2nd floor.  Daughter voiced her understanding.

## 2016-06-19 NOTE — Telephone Encounter (Signed)
Pt daughter notified. See other message dated 06-18-16

## 2016-06-21 ENCOUNTER — Other Ambulatory Visit: Payer: Self-pay | Admitting: Family Medicine

## 2016-06-25 ENCOUNTER — Ambulatory Visit (INDEPENDENT_AMBULATORY_CARE_PROVIDER_SITE_OTHER): Payer: Medicare Other | Admitting: Pharmacist

## 2016-06-25 DIAGNOSIS — I481 Persistent atrial fibrillation: Secondary | ICD-10-CM | POA: Diagnosis not present

## 2016-06-25 DIAGNOSIS — I4891 Unspecified atrial fibrillation: Secondary | ICD-10-CM

## 2016-06-25 DIAGNOSIS — Z5181 Encounter for therapeutic drug level monitoring: Secondary | ICD-10-CM

## 2016-06-25 DIAGNOSIS — I4819 Other persistent atrial fibrillation: Secondary | ICD-10-CM

## 2016-06-25 LAB — COAGUCHEK XS/INR WAIVED
INR: 2.2 — ABNORMAL HIGH (ref 0.9–1.1)
Prothrombin Time: 26.3 s

## 2016-07-01 ENCOUNTER — Institutional Professional Consult (permissible substitution): Payer: Medicare Other | Admitting: Pulmonary Disease

## 2016-07-03 ENCOUNTER — Encounter: Payer: Self-pay | Admitting: Pulmonary Disease

## 2016-07-03 ENCOUNTER — Ambulatory Visit (INDEPENDENT_AMBULATORY_CARE_PROVIDER_SITE_OTHER): Payer: Medicare Other | Admitting: Pulmonary Disease

## 2016-07-03 ENCOUNTER — Other Ambulatory Visit (INDEPENDENT_AMBULATORY_CARE_PROVIDER_SITE_OTHER): Payer: Medicare Other

## 2016-07-03 VITALS — BP 122/62 | HR 72 | Ht 70.0 in | Wt 190.6 lb

## 2016-07-03 DIAGNOSIS — R0602 Shortness of breath: Secondary | ICD-10-CM

## 2016-07-03 LAB — CBC WITH DIFFERENTIAL/PLATELET
Basophils Absolute: 0.1 10*3/uL (ref 0.0–0.1)
Basophils Relative: 0.8 % (ref 0.0–3.0)
Eosinophils Absolute: 0.3 10*3/uL (ref 0.0–0.7)
Eosinophils Relative: 2.7 % (ref 0.0–5.0)
HCT: 41.8 % (ref 36.0–46.0)
Hemoglobin: 13.9 g/dL (ref 12.0–15.0)
Lymphocytes Relative: 30.2 % (ref 12.0–46.0)
Lymphs Abs: 2.9 10*3/uL (ref 0.7–4.0)
MCHC: 33.2 g/dL (ref 30.0–36.0)
MCV: 89.6 fl (ref 78.0–100.0)
Monocytes Absolute: 0.8 10*3/uL (ref 0.1–1.0)
Monocytes Relative: 8.5 % (ref 3.0–12.0)
Neutro Abs: 5.5 10*3/uL (ref 1.4–7.7)
Neutrophils Relative %: 57.8 % (ref 43.0–77.0)
Platelets: 240 10*3/uL (ref 150.0–400.0)
RBC: 4.67 Mil/uL (ref 3.87–5.11)
RDW: 15 % (ref 11.5–15.5)
WBC: 9.6 10*3/uL (ref 4.0–10.5)

## 2016-07-03 LAB — NITRIC OXIDE: Nitric Oxide: 19

## 2016-07-03 MED ORDER — BUDESONIDE-FORMOTEROL FUMARATE 160-4.5 MCG/ACT IN AERO: 2.0000 | INHALATION_SPRAY | Freq: Two times a day (BID) | RESPIRATORY_TRACT | 0 refills | Status: DC

## 2016-07-03 NOTE — Progress Notes (Signed)
[name]    [id number]    10-08-1940  Primary Care Physician:Samuel Wendi Snipes, MD  Referring Physician: Timmothy Euler, MD [place], [place] [address]  Chief complaint:  Consult for evaluation of dyspnea  HPI: Virginia Young is a 76 year old with history of atrial fibrillation, nonischemic cardiomyopathy [EF 40% in 2017]. She has complaints of progressive dyspnea on exertion for the past 1 year. She mostly has symptoms during activity but occasionally during rest as well. She is occasional cough with no mucus production. She notices herself wheezing at times. She denies any chest pain, palpitation.  She is being followed by Dr. Percival Spanish for atrial fibrillation. She has been on amiodarone and coumadin since 2016. She is lifelong nonsmoker although she had been exposed to secondhand smoke from her ex-husband. 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 She does not have any exposures at work or at home. She does not have any pets at home, no mold exposure. She has occasional seasonal allergies to pollen. She denies any heartburn symptoms.  Outpatient Encounter Prescriptions as of 07/03/2016  Medication Sig  . albuterol (PROVENTIL HFA;VENTOLIN HFA) 108 (90 Base) MCG/ACT inhaler Inhale 2 puffs into the lungs every 6 (six) hours as needed for wheezing or shortness of breath.  Marland Kitchen alendronate (FOSAMAX) 70 MG tablet TAKE 1 TABLET ONCE A WEEK WITH A FULL GLASS OF WATER ON AN EMPTY STOMACH  . amiodarone (PACERONE) 200 MG tablet TAKE 1 TABLET (200 MG TOTAL) BY MOUTH DAILY.  . Calcium Citrate-Vitamin D (CALCIUM CITRATE CHEWY BITE) 500-500 MG-UNIT CHEW Chew 2 each by mouth daily.  Marland Kitchen docusate sodium (COLACE) 100 MG capsule Take 1 capsule (100 mg total) by mouth 2 (two) times daily.  . fluticasone (FLONASE) 50 MCG/ACT nasal spray Place 2 sprays into both nostrils daily.  . furosemide (LASIX) 20 MG tablet Take 20 mg by mouth daily.  . meclizine  (ANTIVERT) 25 MG tablet Take 1 tablet (25 mg total) by mouth 3 (three) times daily as needed for dizziness.  . metoprolol succinate (TOPROL-XL) 25 MG 24 hr tablet Take 0.5 tablets (12.5 mg total) by mouth daily.  Marland Kitchen omeprazole (PRILOSEC) 40 MG capsule TAKE 1 CAPSULE (40 MG TOTAL) BY MOUTH DAILY.  Marland Kitchen warfarin (COUMADIN) 5 MG tablet TAKE 1/2 TO 1 TABLET DAILY AS DIRECTED   No facility-administered encounter medications on file as of 07/03/2016.     Allergies as of 07/03/2016 - Review Complete 07/03/2016  Allergen Reaction Noted  . Lipitor [atorvastatin calcium]  08/06/2011    Past Medical History:  Diagnosis Date  . Allergy   . Atrial fibrillation (HCC)    a. coumadin;  b. Amiodarone  . Cataract   . Chest pain 03/2012   a. Lex MV 10/13:  EF 64%, dist ant and apical defect sugg of soft tissue atten, no ischemia  . Chronic systolic heart failure ([place])   . Depression   . GERD (gastroesophageal reflux disease)   . HLD (hyperlipidemia)   . Incontinence of urine   . Mild mitral regurgitation   . MS (multiple sclerosis) ([place])   . NICM (nonischemic cardiomyopathy) ([place])    a. neg CLite in 2003;  b. EF 40-45% in past;   c.  Echo 12/11: EF 35-40%, mild MR, mild LAE, mild RAE, small pericardial effusion   . Osteoarthritis   . Osteoporosis   . Stasis ulcer ([place])   . Uterine prolapse   .  Vaginal prolapse without uterine prolapse   . Varicose veins     Past Surgical History:  Procedure Laterality Date  . ANTERIOR AND POSTERIOR VAGINAL REPAIR W/ SACROSPINOUS LIGAMENT SUSPENSION  2016   [facility]  . BLADDER SURGERY    . cataract surgery Bilateral   . EYE SURGERY    . INGUINAL HERNIA REPAIR     right  . LIPOMA EXCISION     h/o removed from upper back x 2    Family History  Problem Relation Age of Onset  . Diabetes Father 56    diabetes and syncope  . Heart attack Father   . Heart attack Sister   . Heart attack Brother     all 5 brothers had MIs  . Stroke Brother   . Cancer Sister      bone  . Diabetes Sister   . Cancer Sister   . Diabetes Sister   . Heart attack Brother   . Dementia Mother   . Dementia Brother   . CAD    . Diabetes Daughter   . Colon cancer Neg Hx     Social History   Social History  . Marital status: Divorced    Spouse name: N/A  . Number of children: 3  . Years of education: N/A   Occupational History  . retired Retired   Social History Main Topics  . Smoking status: Never Smoker  . Smokeless tobacco: Never Used  . Alcohol use No  . Drug use: No  . Sexual activity: Not Currently   Other Topics Concern  . Not on file   Social History Narrative   [employer] RELEASE ON FILE. Fleet Contras, April 26, 2010 10:09 AM.   Review of systems: Review of Systems  Constitutional: Negative for fever and chills.  HENT: Negative.   Eyes: Negative for blurred vision.  Respiratory: as per HPI  Cardiovascular: Negative for chest pain and palpitations.  Gastrointestinal: Negative for vomiting, diarrhea, blood per rectum. Genitourinary: Negative for dysuria, urgency, frequency and hematuria.  Musculoskeletal: Negative for myalgias, back pain and joint pain.  Skin: Negative for itching and rash.  Neurological: Negative for dizziness, tremors, focal weakness, seizures and loss of consciousness.  Endo/Heme/Allergies: Negative for environmental allergies.  Psychiatric/Behavioral: Negative for depression, suicidal ideas and hallucinations.  All other systems reviewed and are negative.  Physical Exam: Blood pressure 122/62, pulse 72, height 5\' 10"  (1.778 m), weight 190 lb 9.6 oz (86.5 kg), SpO2 95 %. Gen:      No acute distress HEENT:  EOMI, sclera anicteric Neck:     No masses; no thyromegaly Lungs:    Clear to auscultation bilaterally; normal respiratory effort CV:         Regular rate and rhythm; no murmurs Abd:      + bowel sounds; soft, non-tender; no palpable masses, no distension Ext:    No edema; adequate peripheral  perfusion Skin:      Warm and dry; no rash Neuro: alert and oriented x 3 Psych: normal mood and affect  Data Reviewed: PFTs 06/18/16 FVC 2.86 [84%) FEV1 2.29 (99%) F/F 80 TLC 79% DLCO 43%  Minimal restriction, small airway disease Severe diffusion defect  FENO 07/04/15- 19  CXR 03/26/16- stable scoliosis, scarring at left base. Images reviewed.  Assessment:  Consult for evaluation of progressive dyspnea.  She may have asthma, reactive airway disease although it is unusual for her to develop this late in life. She does have symptoms of wheezing, dyspnea but  FENO is low in the office today again against airway inflammation. I will do a trial of Symbicort inhaler and she will get blood tests including CBC with diff and a blood allergy profile for better characterization of her asthma  Her recent PFTs were reviewed. They show very minimal obstructive disease with suggestion of small airway disease with reduction in mid flow rates. This is consistent with asthma as noted above. She also has a marked reduction in diffusion capacity that is out of proportion to mild restrictive process. She'll need to get evaluated for amiodarone lung toxicity or other interstitial process. I'll schedule her for a high-resolution CT of the chest.  Plan/Recommendations: - CBC with diff, blood allergy profile - High res CT of chest - Samples of symbicort inhaler.   Marshell Garfinkel MD [facility] Pager [phone] 07/03/2016, 3:48 PM  CC: Timmothy Euler, MD

## 2016-07-03 NOTE — Progress Notes (Signed)
Patient ID: Virginia Young, female   DOB: [birth year], 76 y.o.   MRN: [id number] Patient seen in the office today and instructed on use of SYMBICORT AEROSOL.  Patient expressed understanding and demonstrated technique.

## 2016-07-03 NOTE — Patient Instructions (Signed)
We'll schedule you for a high-resolution CT of the chest Check blood tests including a CBC with differential, blood allergy profile We will start you on Symbicort inhaler.  Return to clinic in 1 month to review results and response to therapy.

## 2016-07-04 LAB — RESPIRATORY ALLERGY PROFILE REGION II ~~LOC~~
Allergen, A. alternata, m6: <0.1 kU/L
Allergen, C. Herbarum, M2: <0.1 kU/L
Allergen, Cedar tree, t12: <0.1 kU/L
Allergen, Comm Silver Birch, t9: <0.1 kU/L
Allergen, Cottonwood, t14: <0.1 kU/L
Allergen, D pternoyssinus,d7: <0.1 kU/L
Allergen, Mouse Urine Protein, e78: <0.1 kU/L
Allergen, Mulberry, t76: <0.1 kU/L
Allergen, Oak,t7: <0.1 kU/L
Allergen, P. notatum, m1: <0.1 kU/L
Aspergillus fumigatus, m3: <0.1 kU/L
Bermuda Grass: <0.1 kU/L
Box Elder IgE: <0.1 kU/L
Cat Dander: <0.1 kU/L
Cockroach: <0.1 kU/L
Common Ragweed: <0.1 kU/L
D. farinae: <0.1 kU/L
Dog Dander: <0.1 kU/L
Elm IgE: <0.1 kU/L
IgE (Immunoglobulin E), Serum: 21 kU/L (ref <115)
Johnson Grass: <0.1 kU/L
Pecan/Hickory Tree IgE: <0.1 kU/L
Rough Pigweed  IgE: <0.1 kU/L
Sheep Sorrel IgE: <0.1 kU/L
Timothy Grass: <0.1 kU/L

## 2016-07-05 NOTE — Progress Notes (Signed)
Called spoke with patient, advised of lab results / recs as stated by PM.  Pt verbalized her understanding and denied any questions.

## 2016-07-10 ENCOUNTER — Inpatient Hospital Stay: Admission: RE | Admit: 2016-07-10 | Payer: Medicare Other | Source: Ambulatory Visit

## 2016-07-25 ENCOUNTER — Emergency Department (HOSPITAL_COMMUNITY): Payer: Medicare Other

## 2016-07-25 ENCOUNTER — Emergency Department (HOSPITAL_COMMUNITY)
Admission: EM | Admit: 2016-07-25 | Discharge: 2016-07-25 | Disposition: A | Discharge status: Home / Self Care | Payer: Medicare Other | Attending: Emergency Medicine | Admitting: Emergency Medicine

## 2016-07-25 ENCOUNTER — Encounter (HOSPITAL_COMMUNITY): Payer: Self-pay | Admitting: *Deleted

## 2016-07-25 DIAGNOSIS — R202 Paresthesia of skin: Secondary | ICD-10-CM

## 2016-07-25 DIAGNOSIS — Z7901 Long term (current) use of anticoagulants: Secondary | ICD-10-CM | POA: Insufficient documentation

## 2016-07-25 DIAGNOSIS — I5022 Chronic systolic (congestive) heart failure: Secondary | ICD-10-CM | POA: Insufficient documentation

## 2016-07-25 DIAGNOSIS — Z79899 Other long term (current) drug therapy: Secondary | ICD-10-CM | POA: Insufficient documentation

## 2016-07-25 DIAGNOSIS — R531 Weakness: Secondary | ICD-10-CM | POA: Insufficient documentation

## 2016-07-25 DIAGNOSIS — I110 Hypertensive heart disease with heart failure: Secondary | ICD-10-CM | POA: Diagnosis not present

## 2016-07-25 DIAGNOSIS — M79601 Pain in right arm: Secondary | ICD-10-CM | POA: Diagnosis not present

## 2016-07-25 LAB — CBC
HCT: 42.6 % (ref 36.0–46.0)
Hemoglobin: 14.2 g/dL (ref 12.0–15.0)
MCH: 30.1 pg (ref 26.0–34.0)
MCHC: 33.3 g/dL (ref 30.0–36.0)
MCV: 90.4 fL (ref 78.0–100.0)
Platelets: 213 10*3/uL (ref 150–400)
RBC: 4.71 MIL/uL (ref 3.87–5.11)
RDW: 14.4 % (ref 11.5–15.5)
WBC: 9.2 10*3/uL (ref 4.0–10.5)

## 2016-07-25 LAB — COMPREHENSIVE METABOLIC PANEL
ALT: 24 U/L (ref 14–54)
AST: 26 U/L (ref 15–41)
Albumin: 3.8 g/dL (ref 3.5–5.0)
Alkaline Phosphatase: 80 U/L (ref 38–126)
Anion gap: 7 (ref 5–15)
BUN: 13 mg/dL (ref 6–20)
CO2: 25 mmol/L (ref 22–32)
Calcium: 8.8 mg/dL — ABNORMAL LOW (ref 8.9–10.3)
Chloride: 105 mmol/L (ref 101–111)
Creatinine, Ser: 1.08 mg/dL — ABNORMAL HIGH (ref 0.44–1.00)
GFR calc Af Amer: 56 mL/min — ABNORMAL LOW (ref >60)
GFR calc non Af Amer: 49 mL/min — ABNORMAL LOW (ref >60)
Glucose, Bld: 129 mg/dL — ABNORMAL HIGH (ref 65–99)
Potassium: 4.3 mmol/L (ref 3.5–5.1)
Sodium: 137 mmol/L (ref 135–145)
Total Bilirubin: 0.5 mg/dL (ref 0.3–1.2)
Total Protein: 6.4 g/dL — ABNORMAL LOW (ref 6.5–8.1)

## 2016-07-25 LAB — I-STAT CHEM 8, ED
BUN: 14 mg/dL (ref 6–20)
Calcium, Ion: 1.16 mmol/L (ref 1.15–1.40)
Chloride: 102 mmol/L (ref 101–111)
Creatinine, Ser: 1.2 mg/dL — ABNORMAL HIGH (ref 0.44–1.00)
Glucose, Bld: 131 mg/dL — ABNORMAL HIGH (ref 65–99)
HCT: 42 % (ref 36.0–46.0)
Hemoglobin: 14.3 g/dL (ref 12.0–15.0)
Potassium: 4.4 mmol/L (ref 3.5–5.1)
Sodium: 139 mmol/L (ref 135–145)
TCO2: 27 mmol/L (ref 0–100)

## 2016-07-25 LAB — DIFFERENTIAL
Basophils Absolute: 0.1 10*3/uL (ref 0.0–0.1)
Basophils Relative: 1 %
Eosinophils Absolute: 0.2 10*3/uL (ref 0.0–0.7)
Eosinophils Relative: 3 %
Lymphocytes Relative: 16 %
Lymphs Abs: 1.4 10*3/uL (ref 0.7–4.0)
Monocytes Absolute: 0.6 10*3/uL (ref 0.1–1.0)
Monocytes Relative: 7 %
Neutro Abs: 6.9 10*3/uL (ref 1.7–7.7)
Neutrophils Relative %: 75 %

## 2016-07-25 LAB — PROTIME-INR
INR: 2.11
Prothrombin Time: 24 seconds — ABNORMAL HIGH (ref 11.4–15.2)

## 2016-07-25 LAB — APTT: aPTT: 38 seconds — ABNORMAL HIGH (ref 24–36)

## 2016-07-25 LAB — CBG MONITORING, ED: Glucose-Capillary: 159 mg/dL — ABNORMAL HIGH (ref 65–99)

## 2016-07-25 LAB — TROPONIN I: Troponin I: <0.03 ng/mL (ref <0.03)

## 2016-07-25 MED ORDER — GABAPENTIN 100 MG PO CAPS: ORAL_CAPSULE | ORAL | 0 refills | Status: DC

## 2016-07-25 NOTE — ED Provider Notes (Signed)
[facility] DEPT Provider Note   CSN: [id number] Arrival date & time: 07/25/16  1143   By signing my name below, I, Hilbert Odor, attest that this documentation has been prepared under the direction and in the presence of Milton Ferguson, MD. Electronically Signed: Hilbert Odor, Scribe. 07/25/16. 12:53 PM History   Chief Complaint Chief Complaint  Patient presents with  . Numbness   HPI Comments: Virginia Young is a 76 y.o. female who presents to the Emergency Department complaining of right arm numbness and tingling since yesterday. She also reports associated intermittent right arm pain that began this morning. She states that she had some difficulty moving her arm this morning after she woke up. She reports no trouble with moving her arm currently but states that the tingling is still present. She denies any speech problem.  The history is provided by the patient and a relative. No language interpreter was used.  Extremity Weakness  This is a new problem. The current episode started 12 to 24 hours ago. The problem occurs rarely. The problem has been resolved. Pertinent negatives include no chest pain, no abdominal pain and no headaches. Nothing aggravates the symptoms. Nothing relieves the symptoms.     Past Medical History:  Diagnosis Date  . Allergy   . Atrial fibrillation (HCC)    a. coumadin;  b. Amiodarone  . Cataract   . Chest pain 03/2012   a. Lex MV 10/13:  EF 64%, dist ant and apical defect sugg of soft tissue atten, no ischemia  . Chronic systolic heart failure ([place])   . Depression   . GERD (gastroesophageal reflux disease)   . HLD (hyperlipidemia)   . Incontinence of urine   . Mild mitral regurgitation   . MS (multiple sclerosis) ([place])   . NICM (nonischemic cardiomyopathy) ([place])    a. neg CLite in 2003;  b. EF 40-45% in past;   c.  Echo 12/11: EF 35-40%, mild MR, mild LAE, mild RAE, small pericardial effusion   . Osteoarthritis   . Osteoporosis   .  Stasis ulcer ([place])   . Uterine prolapse   . Vaginal prolapse without uterine prolapse   . Varicose veins     Patient Active Problem List   Diagnosis Date Noted  . HTN (hypertension) 02/19/2016  . Post-nasal drip 05/15/2015  . Osteoporosis with fracture 04/06/2014  . Impacted cerumen of both ears 08/20/2013  . Encounter for therapeutic drug monitoring 08/20/2013  . DOE (dyspnea on exertion) 07/23/2013  . Helicobacter positive gastritis 06/21/2013  . Orthostatic hypotension 06/10/2013  . UTI (lower urinary tract infection) 05/06/2013  . Neck mass 02/19/2013  . Neck pain, bilateral 02/19/2013  . Right groin mass 11/08/2011  . LBP (low back pain) 11/08/2011  . Lipoma of neck 06/21/2011  . Memory loss 06/21/2011  . OVERACTIVE BLADDER 08/21/2010  . Long term current use of anticoagulant 07/25/2010  . OSTEOARTHRITIS 01/18/2010  . KNEE PAIN 01/18/2010  . FOOT PAIN 01/18/2010  . Hyperlipidemia with target LDL less than 100 04/20/2009  . GERD 04/17/2009  . MITRAL REGURGITATION, MILD 08/30/2008  . NICM (nonischemic cardiomyopathy) ([place]) 08/30/2008  . Congestive heart failure ([place]) 04/13/2007  . DEPRESSION 04/10/2007  . DISEASE, MITRAL VALVE NEC/NOS 04/10/2007  . Atrial fibrillation ([place]) 04/10/2007  . STASIS ULCER 04/10/2007  . VENOUS INSUFFICIENCY, LEGS 04/10/2007    Past Surgical History:  Procedure Laterality Date  . ANTERIOR AND POSTERIOR VAGINAL REPAIR W/ SACROSPINOUS LIGAMENT SUSPENSION  2016   [facility]  . BLADDER  SURGERY    . cataract surgery Bilateral   . EYE SURGERY    . INGUINAL HERNIA REPAIR     right  . LIPOMA EXCISION     h/o removed from upper back x 2    OB History    No data available       Home Medications    Prior to Admission medications   Medication Sig Start Date End Date Taking? Authorizing Provider  albuterol (PROVENTIL HFA;VENTOLIN HFA) 108 (90 Base) MCG/ACT inhaler Inhale 2 puffs into the lungs every 6 (six) hours as needed for wheezing or  shortness of breath. 06/29/15   Fransisca Kaufmann Dettinger, MD  alendronate (FOSAMAX) 70 MG tablet TAKE 1 TABLET ONCE A WEEK WITH A FULL GLASS OF WATER ON AN EMPTY STOMACH 05/06/16   Timmothy Euler, MD  amiodarone (PACERONE) 200 MG tablet TAKE 1 TABLET (200 MG TOTAL) BY MOUTH DAILY. 06/21/16   Claretta Fraise, MD  budesonide-formoterol [facility]) 160-4.5 MCG/ACT inhaler Inhale 2 puffs into the lungs 2 (two) times daily. 07/03/16 07/04/16  Praveen Mannam, MD  Calcium Citrate-Vitamin D (CALCIUM CITRATE CHEWY BITE) 500-500 MG-UNIT CHEW Chew 2 each by mouth daily.    Historical Provider, MD  docusate sodium (COLACE) 100 MG capsule Take 1 capsule (100 mg total) by mouth 2 (two) times daily. 09/08/15   Fransisca Kaufmann Dettinger, MD  fluticasone (FLONASE) 50 MCG/ACT nasal spray Place 2 sprays into both nostrils daily. 05/15/15   Timmothy Euler, MD  furosemide (LASIX) 20 MG tablet Take 20 mg by mouth daily.    Historical Provider, MD  meclizine (ANTIVERT) 25 MG tablet Take 1 tablet (25 mg total) by mouth 3 (three) times daily as needed for dizziness. 08/03/15   Mary-Margaret Hassell Done, FNP  metoprolol succinate (TOPROL-XL) 25 MG 24 hr tablet Take 0.5 tablets (12.5 mg total) by mouth daily. 02/19/16   Timmothy Euler, MD  omeprazole (PRILOSEC) 40 MG capsule TAKE 1 CAPSULE (40 MG TOTAL) BY MOUTH DAILY. 04/08/16   Timmothy Euler, MD  warfarin (COUMADIN) 5 MG tablet TAKE 1/2 TO 1 TABLET DAILY AS DIRECTED 03/02/16   Timmothy Euler, MD    Family History Family History  Problem Relation Age of Onset  . Diabetes Father 70    diabetes and syncope  . Heart attack Father   . Heart attack Sister   . Heart attack Brother     all 5 brothers had MIs  . Stroke Brother   . Cancer Sister     bone  . Diabetes Sister   . Cancer Sister   . Diabetes Sister   . Heart attack Brother   . Dementia Mother   . Dementia Brother   . CAD    . Diabetes Daughter   . Colon cancer Neg Hx     Social History Social History  Substance  Use Topics  . Smoking status: Never Smoker  . Smokeless tobacco: Never Used  . Alcohol use No     Allergies   Lipitor [atorvastatin calcium]   Review of Systems Review of Systems  Constitutional: Negative for appetite change and fatigue.  HENT: Negative for congestion, ear discharge and sinus pressure.   Eyes: Negative for discharge.  Respiratory: Negative for cough.   Cardiovascular: Negative for chest pain.  Gastrointestinal: Negative for abdominal pain and diarrhea.  Genitourinary: Negative for frequency and hematuria.  Musculoskeletal: Positive for extremity weakness. Negative for back pain.       Right arm weakness.  Skin:  Negative for rash.  Neurological: Positive for numbness (Right arm). Negative for seizures and headaches.  Psychiatric/Behavioral: Negative for hallucinations.  All other systems reviewed and are negative.    Physical Exam Updated Vital Signs BP 195/96 (BP Location: Left Arm)   Pulse 73   Temp 98.8 F (37.1 C) (Oral)   Resp 18   Ht 5\' 10"  (1.778 m)   Wt 186 lb (84.4 kg)   SpO2 95%   BMI 26.69 kg/m   Physical Exam  Constitutional: She is oriented to person, place, and time. She appears well-developed.  HENT:  Head: Normocephalic.  Eyes: Conjunctivae and EOM are normal. No scleral icterus.  Neck: Neck supple. No thyromegaly present.  Cardiovascular: Normal rate and regular rhythm.  Exam reveals no gallop and no friction rub.   No murmur heard. Pulmonary/Chest: No stridor. She has no wheezes. She has no rales. She exhibits no tenderness.  Abdominal: She exhibits no distension. There is no tenderness. There is no rebound.  Musculoskeletal: Normal range of motion. She exhibits no edema.  Lymphadenopathy:    She has no cervical adenopathy.  Neurological: She is oriented to person, place, and time. She exhibits normal muscle tone. Coordination normal.  Skin: No rash noted. No erythema.  Psychiatric: She has a normal mood and affect. Her  behavior is normal.     ED Treatments / Results  DIAGNOSTIC STUDIES: Oxygen Saturation is 95% on RA, normal by my interpretation.    COORDINATION OF CARE: 12:24 AM Discussed treatment plan with pt at bedside, which includes CT head, EKG, and labs, and pt agreed to plan.  Labs (all labs ordered are listed, but only abnormal results are displayed) Labs Reviewed  CBG MONITORING, ED - Abnormal; Notable for the following:       Result Value   Glucose-Capillary 159 (*)    All other components within normal limits  I-STAT CHEM 8, ED - Abnormal; Notable for the following:    Creatinine, Ser 1.20 (*)    Glucose, Bld 131 (*)    All other components within normal limits  CBC  DIFFERENTIAL  PROTIME-INR  APTT  COMPREHENSIVE METABOLIC PANEL  TROPONIN I    EKG  EKG Interpretation None       Radiology Ct Head Wo Contrast  Result Date: 07/25/2016 CLINICAL DATA:  Right arm numbness and tingling since yesterday. EXAM: CT HEAD WITHOUT CONTRAST TECHNIQUE: Contiguous axial images were obtained from the base of the skull through the vertex without intravenous contrast. COMPARISON:  Head CT scan 06/24/2011. FINDINGS: Brain: There is cortical atrophy and chronic microvascular ischemic change. No evidence of acute abnormality including hemorrhage, infarct, mass lesion, mass effect, midline shift or abnormal extra-axial fluid collection. Vascular: Atherosclerosis noted. Skull: Intact. Sinuses/Orbits: Status post bilateral lens extraction. Otherwise negative. Other: None. IMPRESSION: No acute abnormality. Atrophy and chronic microvascular ischemic change. Atherosclerosis. Electronically Signed   By: Inge Rise M.D.   On: 07/25/2016 12:40    Procedures Procedures (including critical care time)  Medications Ordered in ED Medications - No data to display   Initial Impression / Assessment and Plan / ED Course  I have reviewed the triage vital signs and the nursing notes.  Pertinent labs &  imaging results that were available during my care of the patient were reviewed by me and considered in my medical decision making (see chart for details).  Clinical Course as of Jul 26 1603  Thu Jul 25, 2016  1414 INR: 2.11 [EW]    Clinical  Course User Index [EW] Daleen Bo, MD    Patient has CT head and MRI of the head which did not show any stroke. MRI of the neck does show some degenerative changes that could be impinging some peripheral nerves. Patient's symptoms have improved. She'll be put on Neurontin for her neuritis and will follow-up with her PCP  Final Clinical Impressions(s) / ED Diagnoses   Final diagnoses:  None    New Prescriptions New Prescriptions   No medications on file   The chart was scribed for me under my direct supervision.  I personally performed the history, physical, and medical decision making and all procedures in the evaluation of this patient.Milton Ferguson, MD 07/25/16 [phone]

## 2016-07-25 NOTE — Discharge Instructions (Signed)
Follow-up with your doctor next week for recheck of numbness and blood pressure. Call the x-ray department at [facility] to reschedule your CT scan

## 2016-07-25 NOTE — ED Triage Notes (Signed)
Pt states she began having right arm numbness and tingling yesterday. This morning she was unable to move her right arm. Pt continues to have right arm numbness now but is able to move this extremity. Pt denies any speech problems. NAD noted. Pt is alert and oriented.

## 2016-07-25 NOTE — ED Notes (Signed)
Pt taken to MRI  

## 2016-07-26 ENCOUNTER — Ambulatory Visit (INDEPENDENT_AMBULATORY_CARE_PROVIDER_SITE_OTHER): Payer: Medicare Other | Admitting: Family

## 2016-07-26 ENCOUNTER — Encounter: Payer: Self-pay | Admitting: Family

## 2016-07-26 ENCOUNTER — Other Ambulatory Visit: Payer: Self-pay | Admitting: Family

## 2016-07-26 VITALS — BP 143/69 | HR 68 | Temp 96.4°F | Ht 70.0 in | Wt 187.0 lb

## 2016-07-26 DIAGNOSIS — I10 Essential (primary) hypertension: Secondary | ICD-10-CM

## 2016-07-26 DIAGNOSIS — R531 Weakness: Secondary | ICD-10-CM

## 2016-07-26 DIAGNOSIS — M5412 Radiculopathy, cervical region: Secondary | ICD-10-CM | POA: Diagnosis not present

## 2016-07-26 DIAGNOSIS — R200 Anesthesia of skin: Secondary | ICD-10-CM | POA: Diagnosis not present

## 2016-07-26 DIAGNOSIS — Z09 Encounter for follow-up examination after completed treatment for conditions other than malignant neoplasm: Secondary | ICD-10-CM

## 2016-07-26 LAB — MICROSCOPIC EXAMINATION
Renal Epithel, UA: NONE SEEN /hpf
WBC, UA: >30 /hpf — AB (ref 0–?)

## 2016-07-26 LAB — URINALYSIS, COMPLETE
Bilirubin, UA: NEGATIVE
Glucose, UA: NEGATIVE
Ketones, UA: NEGATIVE
Nitrite, UA: NEGATIVE
Protein, UA: NEGATIVE
Specific Gravity, UA: 1.01 (ref 1.005–1.030)
Urobilinogen, Ur: 0.2 mg/dL (ref 0.2–1.0)
pH, UA: 5.5 (ref 5.0–7.5)

## 2016-07-26 MED ORDER — METHYLPREDNISOLONE ACETATE 80 MG/ML IJ SUSP
80.0000 mg | Freq: Once | INTRAMUSCULAR | Status: AC
  Administered 2016-07-26: 80 mg via INTRAMUSCULAR

## 2016-07-26 MED ORDER — NITROFURANTOIN MONOHYD MACRO 100 MG PO CAPS: 100.0000 mg | ORAL_CAPSULE | Freq: Two times a day (BID) | ORAL | 0 refills | Status: DC

## 2016-07-26 NOTE — Progress Notes (Signed)
   Subjective:    Patient ID: Virginia Young, female    DOB: [birth year], 76 y.o.   MRN: [id number]  Pt presents to the office today with right arm pain and numbness, weakness, and HTN. Pt went to [facility] yesterday and had CT head and MRI of head that did not show stroke. MRI of neck degenerative changes. PT was given Gabapentin for her neuritis. PT states that has not helped and continues to have constant tingling and numbness 5 out 10.  Weakness  Associated symptoms include arthralgias and weakness. Pertinent negatives include no headaches.  Hypertension  This is a chronic problem. The current episode started more than 1 year ago. The problem has been waxing and waning since onset. The problem is uncontrolled. Pertinent negatives include no anxiety, headaches, palpitations, peripheral edema or shortness of breath. Risk factors for coronary artery disease include post-menopausal state. The current treatment provides moderate improvement.   [facility] notes reviewed.    Review of Systems  Respiratory: Negative for shortness of breath.   Cardiovascular: Negative for palpitations.  Musculoskeletal: Positive for arthralgias.  Neurological: Positive for weakness. Negative for headaches.  All other systems reviewed and are negative.      Objective:   Physical Exam  Constitutional: She is oriented to person, place, and time. She appears well-developed and well-nourished. No distress.  HENT:  Head: Normocephalic.  Eyes: Pupils are equal, round, and reactive to light.  Neck: Normal range of motion. Neck supple. No thyromegaly present.  Cardiovascular: Normal rate, regular rhythm, normal heart sounds and intact distal pulses.   No murmur heard. Pulmonary/Chest: Effort normal and breath sounds normal. No respiratory distress. She has no wheezes.  Abdominal: Soft. Bowel sounds are normal. She exhibits no distension. There is no tenderness.  Musculoskeletal: Normal range of motion. She  exhibits no edema or tenderness.  Neurological: She is alert and oriented to person, place, and time.  Right sided weakness present, grip decreased in right hand  Skin: Skin is warm and dry.  Psychiatric: She has a normal mood and affect. Her behavior is normal. Judgment and thought content normal.  Vitals reviewed.     BP (!) 143/69   Pulse 68   Temp (!) 96.4 F (35.8 C) (Oral)   Ht 5\' 10"  (1.778 m)   Wt 187 lb (84.8 kg)   BMI 26.83 kg/m      Assessment & Plan:  1. Weakness - Urinalysis, Complete - Ambulatory referral to Neurology - methylPREDNISolone acetate (DEPO-MEDROL) injection 80 mg; Inject 1 mL (80 mg total) into the muscle once.  2. Essential hypertension -Stable toda - Ambulatory referral to Neurology - methylPREDNISolone acetate (DEPO-MEDROL) injection 80 mg; Inject 1 mL (80 mg total) into the muscle once.  3. Right sided weakness - Ambulatory referral to Neurology - methylPREDNISolone acetate (DEPO-MEDROL) injection 80 mg; Inject 1 mL (80 mg total) into the muscle once.  4. Numbness on right side - Ambulatory referral to Neurology - methylPREDNISolone acetate (DEPO-MEDROL) injection 80 mg; Inject 1 mL (80 mg total) into the muscle once.  5. Cervical radiculitis  6. Hospital discharge follow-up  Will try depo-medrol to see if related to radiculitis pain Neurologists referral placed Keep follow up with PCP  Evelina Dun, FNP

## 2016-07-26 NOTE — Patient Instructions (Signed)
Cervical Radiculopathy Introduction Cervical radiculopathy happens when a nerve in the neck (cervical nerve) is pinched or bruised. This condition can develop because of an injury or as part of the normal aging process. Pressure on the cervical nerves can cause pain or numbness that runs from the neck all the way down into the arm and fingers. Usually, this condition gets better with rest. Treatment may be needed if the condition does not improve. What are the causes? This condition may be caused by:  Injury.  Slipped (herniated) disk.  Muscle tightness in the neck because of overuse.  Arthritis.  Breakdown or degeneration in the bones and joints of the spine (spondylosis) due to aging.  Bone spurs that may develop near the cervical nerves. What are the signs or symptoms? Symptoms of this condition include:  Pain that runs from the neck to the arm and hand. The pain can be severe or irritating. It may be worse when the neck is moved.  Numbness or weakness in the affected arm and hand. How is this diagnosed? This condition may be diagnosed based on symptoms, medical history, and a physical exam. You may also have tests, including:  X-rays.  CT scan.  MRI.  Electromyogram (EMG).  Nerve conduction tests. How is this treated? In many cases, treatment is not needed for this condition. With rest, the condition usually gets better over time. If treatment is needed, options may include:  Wearing a soft neck collar for short periods of time.  Physical therapy to strengthen your neck muscles.  Medicines, such as NSAIDs, oral corticosteroids, or spinal injections.  Surgery. This may be needed if other treatments do not help. Various types of surgery may be done depending on the cause of your problems. Follow these instructions at home: Managing pain  Take over-the-counter and prescription medicines only as told by your health care provider.  If directed, apply ice to the  affected area.  Put ice in a plastic bag.  Place a towel between your skin and the bag.  Leave the ice on for 20 minutes, 2-3 times per day.  If ice does not help, you can try using heat. Take a warm shower or warm bath, or use a heat pack as told by your health care provider.  Try a gentle neck and shoulder massage to help relieve symptoms. Activity  Rest as needed. Follow instructions from your health care provider about any restrictions on activities.  Do stretching and strengthening exercises as told by your health care provider or physical therapist. General instructions  If you were given a soft collar, wear it as told by your health care provider.  Use a flat pillow when you sleep.  Keep all follow-up visits as told by your health care provider. This is important. Contact a health care provider if:  Your condition does not improve with treatment. Get help right away if:  Your pain gets much worse and cannot be controlled with medicines.  You have weakness or numbness in your hand, arm, face, or leg.  You have a high fever.  You have a stiff, rigid neck.  You lose control of your bowels or your bladder (have incontinence).  You have trouble with walking, balance, or speaking. This information is not intended to replace advice given to you by your health care provider. Make sure you discuss any questions you have with your health care provider. Document Released: 03/05/2001 Document Revised: 11/16/2015 Document Reviewed: 08/04/2014  2017 Elsevier

## 2016-07-27 ENCOUNTER — Other Ambulatory Visit: Payer: Self-pay | Admitting: Family Medicine

## 2016-07-30 ENCOUNTER — Ambulatory Visit: Payer: Medicare Other | Admitting: Adult Health

## 2016-08-01 ENCOUNTER — Encounter: Payer: Self-pay | Admitting: *Deleted

## 2016-08-06 ENCOUNTER — Ambulatory Visit (INDEPENDENT_AMBULATORY_CARE_PROVIDER_SITE_OTHER): Payer: Medicare Other

## 2016-08-06 ENCOUNTER — Encounter: Payer: Self-pay | Admitting: Family Medicine

## 2016-08-06 ENCOUNTER — Telehealth: Payer: Self-pay | Admitting: Family Medicine

## 2016-08-06 ENCOUNTER — Ambulatory Visit: Payer: Medicare Other | Admitting: Adult Health

## 2016-08-06 ENCOUNTER — Ambulatory Visit (INDEPENDENT_AMBULATORY_CARE_PROVIDER_SITE_OTHER): Payer: Medicare Other | Admitting: Family Medicine

## 2016-08-06 VITALS — BP 136/74 | HR 62 | Temp 97.0°F | Ht 70.0 in | Wt 189.0 lb

## 2016-08-06 DIAGNOSIS — R29898 Other symptoms and signs involving the musculoskeletal system: Secondary | ICD-10-CM | POA: Diagnosis not present

## 2016-08-06 DIAGNOSIS — R1031 Right lower quadrant pain: Secondary | ICD-10-CM

## 2016-08-06 DIAGNOSIS — D049 Carcinoma in situ of skin, unspecified: Secondary | ICD-10-CM

## 2016-08-06 DIAGNOSIS — I10 Essential (primary) hypertension: Secondary | ICD-10-CM

## 2016-08-06 DIAGNOSIS — I4891 Unspecified atrial fibrillation: Secondary | ICD-10-CM

## 2016-08-06 DIAGNOSIS — Z5181 Encounter for therapeutic drug level monitoring: Secondary | ICD-10-CM

## 2016-08-06 LAB — COAGUCHEK XS/INR WAIVED
INR: 4 — ABNORMAL HIGH (ref 0.9–1.1)
Prothrombin Time: 48.6 s

## 2016-08-06 NOTE — Addendum Note (Signed)
Addended by: Claretta Fraise on: 08/06/2016 09:41 PM   Modules accepted: Level of Service

## 2016-08-06 NOTE — Patient Instructions (Addendum)
Use Metamucil 1 tablespoon twice daily for constipation.  When pain is moderate and abdomen seems bloated  can add MiraLAX one capful twice daily as needed.  Since short MRI of your brain was normal 2 weeks ago and her symptoms have been going on that long, I do not believe this was a stroke causing your right arm numbness. We will arrange a test of the nerves in the arm for you.

## 2016-08-06 NOTE — Telephone Encounter (Signed)
Daughter questioning whether nerve conduction test will be done at appt with neurology Explained to daughter that the test would probably not be done at initial visit She verbalizes understanding

## 2016-08-06 NOTE — Addendum Note (Signed)
Addended by: Claretta Fraise on: 08/06/2016 09:41 PM   Modules accepted: Orders

## 2016-08-06 NOTE — Telephone Encounter (Signed)
Patient;s INR was 4.0 Hold for 2 days, then decrease warfarin dose to 1 tablet M and F.  Take 1/2 tablet all other days. Recheck 08/14/16 at 2pm

## 2016-08-06 NOTE — Progress Notes (Addendum)
Subjective:  Patient ID: Virginia Young, female    DOB: 03/01/1941  Age: 76 y.o. MRN: [id number]  CC: Hypertension (pt here today for routine follow up on her HTN and also a protime)   HPI [name] presents for 2 weeks of right arm and shoulder numbness. No pain. Noted some abduction weakness.No known injury. Symptoms insidious. Getting worse. The numbness extends from the anterior deltoid/acromioclavicular region down the biceps surface of the arm. There is some tingling in the hand. She is able to do normal activities but feels that her grip is somewhat weak. Patient went to the emergency room 2 weeks ago at onset. At that time MRI and CT of the head were done. Both were negative for intracranial pathology. MRI and CT results posted below.   Patient in for follow-up of atrial fibrillation. Patient denies any recent bouts of chest pain or palpitations. Additionally, patient is taking anticoagulants. Patient denies any recent excessive bleeding episodes including epistaxis, bleeding from the gums, genitalia, rectal bleeding or hematuria. Additionally there has been no excessive bruising.INR today elevated at 4.0 this will be followed up on through Coumadin clinic, [name].   follow-up of hypertension. Patient has no history of headache chest pain or shortness of breath or recent cough. Patient also denies symptoms of TIA such as numbness weakness lateralizing. Patient checks  blood pressure at home and has not had any elevated readings recently. Patient denies side effects from his medication. States taking it regularly.  Patient has additional concern for lesion on the left hip. This is been present for about a year and seems to be getting bigger.  History Neneh has a past medical history of Allergy; Atrial fibrillation ([place]); Cataract; Chest pain (03/2012); Chronic systolic heart failure ([place]); Depression; GERD (gastroesophageal reflux disease); HLD (hyperlipidemia);  Incontinence of urine; Mild mitral regurgitation; MS (multiple sclerosis) ([place]); NICM (nonischemic cardiomyopathy) ([place]); Osteoarthritis; Osteoporosis; Stasis ulcer ([place]); Uterine prolapse; Vaginal prolapse without uterine prolapse; and Varicose veins.   She has a past surgical history that includes Lipoma excision; Inguinal hernia repair; cataract surgery (Bilateral); Eye surgery; Anterior and posterior vaginal repair w/ sacrospinous ligament suspension (2016); and Bladder surgery.   Her family history includes Cancer in her sister and sister; Dementia in her brother and mother; Diabetes in her daughter, sister, and sister; Diabetes (age of onset: 23) in her father; Heart attack in her brother, brother, father, and sister; Stroke in her brother.She reports that she has never smoked. She has never used smokeless tobacco. She reports that she does not drink alcohol or use drugs.    ROS Review of Systems  Constitutional: Negative for activity change, appetite change and fever.  HENT: Negative for congestion, rhinorrhea and sore throat.   Eyes: Negative for visual disturbance.  Respiratory: Negative for cough and shortness of breath.   Cardiovascular: Negative for chest pain and palpitations.  Gastrointestinal: Positive for abdominal distention (RLQ. Ongoing several days) and abdominal pain. Negative for diarrhea and nausea.  Genitourinary: Negative for dysuria.  Musculoskeletal: Positive for arthralgias. Negative for myalgias.  Neurological: Positive for numbness.    Objective:  BP 136/74   Pulse 62   Temp 97 F (36.1 C) (Oral)   Ht 5\' 10"  (1.778 m)   Wt 189 lb (85.7 kg)   BMI 27.12 kg/m   BP Readings from Last 3 Encounters:  08/06/16 136/74  07/26/16 (!) 143/69  07/25/16 (!) 200/106    Wt Readings from Last 3 Encounters:  08/06/16 189  lb (85.7 kg)  07/26/16 187 lb (84.8 kg)  07/25/16 186 lb (84.4 kg)     Physical Exam  Skin:  1 x 3 cm lesion at the left lateral hip near  the lesser trochanteric region. This has some erythema with a thick central scale slightly yellowish.    Ct Head Wo Contrast  Result Date: 07/25/2016 CLINICAL DATA:  Right arm numbness and tingling since yesterday. EXAM: CT HEAD WITHOUT CONTRAST TECHNIQUE: Contiguous axial images were obtained from the base of the skull through the vertex without intravenous contrast. COMPARISON:  Head CT scan 06/24/2011. FINDINGS: Brain: There is cortical atrophy and chronic microvascular ischemic change. No evidence of acute abnormality including hemorrhage, infarct, mass lesion, mass effect, midline shift or abnormal extra-axial fluid collection. Vascular: Atherosclerosis noted. Skull: Intact. Sinuses/Orbits: Status post bilateral lens extraction. Otherwise negative. Other: None. IMPRESSION: No acute abnormality. Atrophy and chronic microvascular ischemic change. Atherosclerosis. Electronically Signed   By: Inge Rise M.D.   On: 07/25/2016 12:40   Mr Brain Wo Contrast  Result Date: 07/25/2016 CLINICAL DATA:  Initial evaluation for acute left-sided weakness, dizziness for 2 days. EXAM: MRI HEAD WITHOUT CONTRAST TECHNIQUE: Multiplanar, multiecho pulse sequences of the brain and surrounding structures were obtained without intravenous contrast. COMPARISON:  Prior CT from 07/25/2016. FINDINGS: Brain: Diffuse prominence of the CSF containing spaces is compatible with generalized age-related cerebral atrophy. Patchy and confluent T2/FLAIR hyperdensity within the periventricular and deep white matter both cerebral hemispheres most consistent with chronic microvascular disease, mild nature. No abnormal foci of restricted diffusion to suggest acute or subacute ischemia. Gray-white matter differentiation maintained. No evidence for acute intracranial hemorrhage. Single subcentimeter focus of susceptibility artifact within the right frontal lobe noted, likely tiny chronic microhemorrhage. No other evidence for chronic  hemorrhage. No evidence for chronic or prior infarction. No mass lesion, midline shift or mass effect. No hydrocephalus. No extra-axial fluid collection. Major dural sinuses are grossly patent. Pituitary gland within normal limits. Vascular: Major intracranial vascular flow voids are maintained. Skull and upper cervical spine: Craniocervical junction normal. Visualized upper cervical spine unremarkable. Bone marrow signal intensity within normal limits. No scalp soft tissue abnormality. Sinuses/Orbits: Globes and orbital soft tissues within normal limits. Patient status post lens extraction bilaterally. Paranasal sinuses are largely clear. No mastoid effusion. Inner ear structures normal. IMPRESSION: 1. No acute intracranial infarct or other process identified. 2. Generalized age-related cerebral atrophy with mild chronic small vessel ischemic disease. Electronically Signed   By: Jeannine Boga M.D.   On: 07/25/2016 15:11   Mr Cervical Spine Wo Contrast  Result Date: 07/25/2016 CLINICAL DATA:  Left-sided weakness and dizziness for 2 days. EXAM: MRI CERVICAL SPINE WITHOUT CONTRAST TECHNIQUE: Multiplanar, multisequence MR imaging of the cervical spine was performed. No intravenous contrast was administered. COMPARISON:  None. FINDINGS: Alignment: Normal overall alignment. Moderate degenerative cervical spondylosis with multilevel disc disease and facet disease. Vertebrae: Normal marrow signal. No fracture or bone lesion. Mild endplate reactive changes. Cord: Grossly normal cord signal.  No cord lesions or syrinx. Posterior Fossa, vertebral arteries, paraspinal tissues: No significant findings. Disc levels: C1-2: Moderate degenerative changes but no significant pannus formation or mass effect on the upper cervical cord. C2-3:  No significant findings. C3-4: Disc osteophyte complex on the right side and severe right-sided facet disease contributing to moderate to moderately severe right foraminal stenosis  likely effecting the right C4 nerve. No spinal or left foraminal stenosis. C4-5: Bulging degenerated annulus, osteophytic ridging and uncinate spurring. There is mild flattening  of the ventral thecal sac and mild narrowing of the ventral CSF space. Right-sided disc osteophyte complex with mild foraminal stenosis. C5-6: Bulging degenerated annulus, osteophytic ridging and uncinate spurring. There is moderate flattening of the ventral thecal sac and narrowing the ventral CSF space. Left-sided disc osteophyte complex with medial foraminal stenosis likely effecting the left C6 nerve root. Mild right foraminal stenosis also. C6-7: Bulging annulus, broad-based central disc protrusion, osteophytic ridging and uncinate spurring. Flattening of the ventral thecal sac and narrowing of the ventral CSF space. No significant foraminal stenosis. C7-T1:  No significant findings. T1-2: Small central disc extrusion coursing up behind the T1 vertebral body. No significant neural compression. IMPRESSION: 1. Degenerative cervical spondylosis with multilevel disc disease and facet disease. 2. No acute bony findings and normal appearance of the cervical spinal cord. 3. Moderate to mildly severe right foraminal stenosis at C3-4. 4. Mild spinal and right foraminal stenosis at C4-5. 5. Left foraminal stenosis at C5-6 likely effecting the left C6 nerve root. 6. Small central disc extrusion at T1-2. Electronically Signed   By: Marijo Sanes M.D.   On: 07/25/2016 15:10    Assessment & Plan:   Jeniece was seen today for hypertension.  Diagnoses and all orders for this visit:  Weakness of shoulder -     DG Shoulder Right; Future -     Nerve conduction test; Future  Encounter for therapeutic drug monitoring -     CoaguChek XS/INR Waived  Atrial fibrillation, unspecified type ([place]) -     CoaguChek XS/INR Waived  Abdominal pain, RLQ -     DG Abd 2 Views; Future  Essential hypertension    I have discontinued Ms. Bedoya's  nitrofurantoin (macrocrystal-monohydrate). I am also having her maintain her albuterol, docusate sodium, metoprolol succinate, omeprazole, furosemide, amiodarone, budesonide-formoterol, Cholecalciferol (VITAMIN D PO), fluticasone, warfarin, gabapentin, and alendronate.   Abdominal x-ray reveals increased stool in theColon. Shoulder x-ray reveals no acute pathology.  Allergies as of 08/06/2016      Reactions   Lipitor [atorvastatin Calcium]    myalgia      Medication List       Accurate as of 08/06/16  9:37 PM. Always use your most recent med list.          albuterol 108 (90 Base) MCG/ACT inhaler Commonly known as:  PROVENTIL HFA;VENTOLIN HFA Inhale 2 puffs into the lungs every 6 (six) hours as needed for wheezing or shortness of breath.   alendronate 70 MG tablet Commonly known as:  FOSAMAX TAKE 1 TABLET ONCE A WEEK WITH A FULL GLASS OF WATER ON AN EMPTY STOMACH   amiodarone 200 MG tablet Commonly known as:  PACERONE TAKE 1 TABLET (200 MG TOTAL) BY MOUTH DAILY.   budesonide-formoterol 160-4.5 MCG/ACT inhaler Commonly known as:  SYMBICORT Inhale 2 puffs into the lungs 2 (two) times daily.   docusate sodium 100 MG capsule Commonly known as:  COLACE Take 1 capsule (100 mg total) by mouth 2 (two) times daily.   fluticasone 50 MCG/ACT nasal spray Commonly known as:  FLONASE Place 2 sprays into both nostrils daily as needed for allergies or rhinitis.   furosemide 20 MG tablet Commonly known as:  LASIX Take 20 mg by mouth daily.   gabapentin 100 MG capsule Commonly known as:  NEURONTIN Take one every 8 hours as needed for numbness in right arm   metoprolol succinate 25 MG 24 hr tablet Commonly known as:  TOPROL-XL Take 0.5 tablets (12.5 mg total) by mouth daily.  omeprazole 40 MG capsule Commonly known as:  PRILOSEC TAKE 1 CAPSULE (40 MG TOTAL) BY MOUTH DAILY.   VITAMIN D PO Take 1 tablet by mouth daily.   warfarin 5 MG tablet Commonly known as:  COUMADIN Take  2.5-5 mg by mouth daily at 6 PM. Take 5 mg on Monday, Wednesday, and Friday. 2.5 mg on Tuesday, Thursday,Saturday, and Sunday.      Patient was given verbal and written reminder of upcoming neurological referral.  For constipation she was recommended to take Metamucil 1 tablespoon twice a day. She will use MiraLAX 1 capful twice daily when necessary as a backup  Follow-up: Return in about 1 month (around 09/03/2016).  Claretta Fraise, M.D.

## 2016-08-14 ENCOUNTER — Encounter: Payer: Self-pay | Admitting: Pharmacist

## 2016-08-19 ENCOUNTER — Ambulatory Visit: Payer: Medicare Other | Admitting: Pulmonary Disease

## 2016-08-19 ENCOUNTER — Ambulatory Visit (INDEPENDENT_AMBULATORY_CARE_PROVIDER_SITE_OTHER): Payer: Medicare Other | Admitting: Pharmacist

## 2016-08-19 DIAGNOSIS — I4819 Other persistent atrial fibrillation: Secondary | ICD-10-CM

## 2016-08-19 DIAGNOSIS — Z5181 Encounter for therapeutic drug level monitoring: Secondary | ICD-10-CM

## 2016-08-19 DIAGNOSIS — I481 Persistent atrial fibrillation: Secondary | ICD-10-CM | POA: Diagnosis not present

## 2016-08-19 DIAGNOSIS — I4891 Unspecified atrial fibrillation: Secondary | ICD-10-CM | POA: Diagnosis not present

## 2016-08-19 LAB — COAGUCHEK XS/INR WAIVED
INR: 1.5 — ABNORMAL HIGH (ref 0.9–1.1)
Prothrombin Time: 18.2 s

## 2016-08-20 ENCOUNTER — Ambulatory Visit: Payer: Medicare Other | Admitting: Adult Health

## 2016-08-28 ENCOUNTER — Ambulatory Visit (INDEPENDENT_AMBULATORY_CARE_PROVIDER_SITE_OTHER): Payer: Medicare Other | Admitting: Neurology

## 2016-08-28 ENCOUNTER — Telehealth: Payer: Self-pay | Admitting: Neurology

## 2016-08-28 ENCOUNTER — Encounter: Payer: Self-pay | Admitting: Neurology

## 2016-08-28 ENCOUNTER — Other Ambulatory Visit: Payer: Self-pay | Admitting: Family Medicine

## 2016-08-28 VITALS — BP 186/97 | HR 73 | Ht 70.0 in | Wt 190.8 lb

## 2016-08-28 DIAGNOSIS — R531 Weakness: Secondary | ICD-10-CM | POA: Diagnosis not present

## 2016-08-28 DIAGNOSIS — M25551 Pain in right hip: Secondary | ICD-10-CM

## 2016-08-28 DIAGNOSIS — R200 Anesthesia of skin: Secondary | ICD-10-CM | POA: Diagnosis not present

## 2016-08-28 DIAGNOSIS — R202 Paresthesia of skin: Secondary | ICD-10-CM | POA: Diagnosis not present

## 2016-08-28 DIAGNOSIS — M5431 Sciatica, right side: Secondary | ICD-10-CM | POA: Diagnosis not present

## 2016-08-28 DIAGNOSIS — R1031 Right lower quadrant pain: Secondary | ICD-10-CM

## 2016-08-28 NOTE — Patient Instructions (Signed)
Remember to drink plenty of fluid, eat healthy meals and do not skip any meals. Try to eat protein with a every meal and eat a healthy snack such as fruit or nuts in between meals. Try to keep a regular sleep-wake schedule and try to exercise daily, particularly in the form of walking, 20-30 minutes a day, if you can.   As far as diagnostic testing: EMG/NCS right arm, Hip xrays right hip  I would like to see you back for emg/ncs, sooner if we need to. Please call us with any interim questions, concerns, problems, updates or refill requests.   Our phone number is [phone]. We also have an after hours call service for urgent matters and there is a physician on-call for urgent questions. For any emergencies you know to call 911 or go to the nearest emergency room

## 2016-08-28 NOTE — Progress Notes (Signed)
[facility]    Provider:  Dr Jaynee Eagles Referring Provider: Timmothy Euler, MD Primary Care Physician:  Kenn File, MD  CC:  Right arm numbness and weakness  HPI:  Virginia Young is a 76 y.o. female here as a referral from Dr. Wendi Snipes for right arm weakness and numbness. Past medical history of atrial fibrillation on Coumadin, chronic systolic heart failure, depression, hyperlipidemia, incontinence, hypertension, dyspnea on exertion, orthostatic hypotension, urinary tract infection, neck pain bilateral, memory loss. She woke up with arm numbness and weakness and also has right leg numbness and weakness and dizziness. Felt like she was going to pass out. The groin hurts with pain that radiates to the back and hip. She has numbness the whole arm. She has significant joint pain including knees, elbows, shoulders, hips. She doesn't feel her arm when she hits it. Mobility may be a little better since visiting the hospital. Leg feels weak. Sh has hip pain and radiation into the right groin. She has chronic back pain. Reviewed notes from the emergency room and patient only complained of right arm numbness at that time did not discuss her leg pain at all.  Reviewed notes, labs and imaging from outside physicians, which showed:   Patient presented with right arm pain and numbness on 07/26/2016 due to her primary care office. She complained of weakness. She went to [facility] and had CAT scan and MRI of the head that did not show stroke. MRI of the neck showed degenerative changes. PT was ordered. She was given gabapentin for her neuritis. PT has not helped and continues to have constant tingling and numbness 5 out of 10. She also has arthralgias or weakness.  Patient was seen at [facility] emergency room on 07/25/2016. Complaining of right arm numbness and tingling since the previous day. She also reported associated intermittent right arm pain that began that morning. She had  some difficulty moving her arm after she woke up. She was able to move in the emergency room but the tingling was still present. No speech problems. She was placed on Neurontin is discharged.  Personally reviewed images of MRI of the brain and cervical spine and agree with the findings below:  MRI brain  FINDINGS: Brain: Diffuse prominence of the CSF containing spaces is compatible with generalized age-related cerebral atrophy. Patchy and confluent T2/FLAIR hyperdensity within the periventricular and deep white matter both cerebral hemispheres most consistent with chronic microvascular disease, mild nature.  No abnormal foci of restricted diffusion to suggest acute or subacute ischemia. Gray-white matter differentiation maintained. No evidence for acute intracranial hemorrhage. Single subcentimeter focus of susceptibility artifact within the right frontal lobe noted, likely tiny chronic microhemorrhage. No other evidence for chronic hemorrhage. No evidence for chronic or prior infarction.  No mass lesion, midline shift or mass effect. No hydrocephalus. No extra-axial fluid collection. Major dural sinuses are grossly patent.  Pituitary gland within normal limits.  Vascular: Major intracranial vascular flow voids are maintained.  Skull and upper cervical spine: Craniocervical junction normal. Visualized upper cervical spine unremarkable. Bone marrow signal intensity within normal limits. No scalp soft tissue abnormality.  Sinuses/Orbits: Globes and orbital soft tissues within normal limits. Patient status post lens extraction bilaterally. Paranasal sinuses are largely clear. No mastoid effusion. Inner ear structures normal.  IMPRESSION: 1. No acute intracranial infarct or other process identified. 2. Generalized age-related cerebral atrophy with mild chronic small vessel ischemic disease.  MRI cervical spine:  MRI CERVICAL SPINE WITHOUT  CONTRAST  TECHNIQUE: Multiplanar, multisequence MR imaging of the cervical spine was performed. No intravenous contrast was administered.  COMPARISON:  None.  FINDINGS: Alignment: Normal overall alignment. Moderate degenerative cervical spondylosis with multilevel disc disease and facet disease.  Vertebrae: Normal marrow signal. No fracture or bone lesion. Mild endplate reactive changes.  Cord: Grossly normal cord signal.  No cord lesions or syrinx.  Posterior Fossa, vertebral arteries, paraspinal tissues: No significant findings.  Disc levels:  C1-2: Moderate degenerative changes but no significant pannus formation or mass effect on the upper cervical cord.  C2-3:  No significant findings.  C3-4: Disc osteophyte complex on the right side and severe right-sided facet disease contributing to moderate to moderately severe right foraminal stenosis likely effecting the right C4 nerve. No spinal or left foraminal stenosis.  C4-5: Bulging degenerated annulus, osteophytic ridging and uncinate spurring. There is mild flattening of the ventral thecal sac and mild narrowing of the ventral CSF space. Right-sided disc osteophyte complex with mild foraminal stenosis.  C5-6: Bulging degenerated annulus, osteophytic ridging and uncinate spurring. There is moderate flattening of the ventral thecal sac and narrowing the ventral CSF space. Left-sided disc osteophyte complex with medial foraminal stenosis likely effecting the left C6 nerve root. Mild right foraminal stenosis also.  C6-7: Bulging annulus, broad-based central disc protrusion, osteophytic ridging and uncinate spurring. Flattening of the ventral thecal sac and narrowing of the ventral CSF space. No significant foraminal stenosis.  C7-T1:  No significant findings.  T1-2: Small central disc extrusion coursing up behind the T1 vertebral body. No significant neural compression.  IMPRESSION: 1. Degenerative  cervical spondylosis with multilevel disc disease and facet disease. 2. No acute bony findings and normal appearance of the cervical spinal cord. 3. Moderate to mildly severe right foraminal stenosis at C3-4. 4. Mild spinal and right foraminal stenosis at C4-5. 5. Left foraminal stenosis at C5-6 likely effecting the left C6 nerve root. 6. Small central disc extrusion at T1-2.  Review of Systems: Patient complains of symptoms per HPI as well as the following symptoms: No CP, no SOB. Pertinent negatives per HPI. All others negative.   Social History   Social History  . Marital status: Divorced    Spouse name: N/A  . Number of children: 3  . Years of education: 12   Occupational History  . retired Retired   Social History Main Topics  . Smoking status: Never Smoker  . Smokeless tobacco: Never Used  . Alcohol use No  . Drug use: No  . Sexual activity: Not Currently   Other Topics Concern  . Not on file   Social History Narrative   DESIGNATED PARTY RELEASE ON FILE. Fleet Contras, April 26, 2010 10:09 AM.      Lives at home alone   Right-handed   Caffeine: 1 cup of coffee daily    Family History  Problem Relation Age of Onset  . Diabetes Father 61    diabetes and syncope  . Heart attack Father   . Heart attack Sister   . Heart attack Brother     all 5 brothers had MIs  . Stroke Brother   . Cancer Sister     bone  . Diabetes Sister   . Cancer Sister   . Diabetes Sister   . Heart attack Brother   . Dementia Mother   . Dementia Brother   . CAD    . Diabetes Daughter   . Colon cancer Neg Hx     Past Medical History:  Diagnosis Date  . Allergy   . Atrial fibrillation (HCC)    a. coumadin;  b. Amiodarone  . Cataract   . Chest pain 03/2012   a. Lex MV 10/13:  EF 64%, dist ant and apical defect sugg of soft tissue atten, no ischemia  . Chronic systolic heart failure ([place])   . Depression   . GERD (gastroesophageal reflux disease)   . HLD (hyperlipidemia)    . Incontinence of urine   . Mild mitral regurgitation   . MS (multiple sclerosis) ([place])   . NICM (nonischemic cardiomyopathy) ([place])    a. neg CLite in 2003;  b. EF 40-45% in past;   c.  Echo 12/11: EF 35-40%, mild MR, mild LAE, mild RAE, small pericardial effusion   . Osteoarthritis   . Osteoporosis   . Stasis ulcer ([place])   . Uterine prolapse   . Vaginal prolapse without uterine prolapse   . Varicose veins     Past Surgical History:  Procedure Laterality Date  . ANTERIOR AND POSTERIOR VAGINAL REPAIR W/ SACROSPINOUS LIGAMENT SUSPENSION  2016   [facility]  . BLADDER SURGERY    . cataract surgery Bilateral   . EYE SURGERY    . INGUINAL HERNIA REPAIR     right  . LIPOMA EXCISION     h/o removed from upper back x 2    Current Outpatient Prescriptions  Medication Sig Dispense Refill  . albuterol (PROVENTIL HFA;VENTOLIN HFA) 108 (90 Base) MCG/ACT inhaler Inhale 2 puffs into the lungs every 6 (six) hours as needed for wheezing or shortness of breath. 1 Inhaler 0  . alendronate (FOSAMAX) 70 MG tablet TAKE 1 TABLET ONCE A WEEK WITH A FULL GLASS OF WATER ON AN EMPTY STOMACH 12 tablet 1  . amiodarone (PACERONE) 200 MG tablet TAKE 1 TABLET (200 MG TOTAL) BY MOUTH DAILY. 90 tablet 0  . Cholecalciferol (VITAMIN D PO) Take 1 tablet by mouth daily.    Marland Kitchen docusate sodium (COLACE) 100 MG capsule Take 1 capsule (100 mg total) by mouth 2 (two) times daily. 200 capsule 0  . fluticasone (FLONASE) 50 MCG/ACT nasal spray Place 2 sprays into both nostrils daily as needed for allergies or rhinitis.    . furosemide (LASIX) 20 MG tablet Take 20 mg by mouth daily.    Marland Kitchen gabapentin (NEURONTIN) 100 MG capsule Take one every 8 hours as needed for numbness in right arm 60 capsule 0  . metoprolol succinate (TOPROL-XL) 25 MG 24 hr tablet Take 0.5 tablets (12.5 mg total) by mouth daily. 30 tablet 3  . omeprazole (PRILOSEC) 40 MG capsule TAKE 1 CAPSULE (40 MG TOTAL) BY MOUTH DAILY. 90 capsule 2  . warfarin (COUMADIN) 5  MG tablet Take 2.5-5 mg by mouth daily at 6 PM. Take 5 mg on Monday, Wednesday, and Friday. 2.5 mg on Tuesday, Thursday,Saturday, and Sunday.    . warfarin (COUMADIN) 5 MG tablet TAKE 1/2 TO 1 TABLET DAILY AS DIRECTED 90 tablet 1  . budesonide-formoterol (SYMBICORT) 160-4.5 MCG/ACT inhaler Inhale 2 puffs into the lungs 2 (two) times daily. 1 Inhaler 0   No current facility-administered medications for this visit.     Allergies as of 08/28/2016 - Review Complete 08/28/2016  Allergen Reaction Noted  . Lipitor [atorvastatin calcium]  08/06/2011    Vitals: BP (!) 186/97   Pulse 73   Ht 5\' 10"  (1.778 m)   Wt 190 lb 12.8 oz (86.5 kg)   BMI 27.38 kg/m  Last Weight:  Wt Readings  from Last 1 Encounters:  08/28/16 190 lb 12.8 oz (86.5 kg)   Last Height:   Ht Readings from Last 1 Encounters:  08/28/16 5\' 10"  (1.778 m)     Needs to use hands  Can walk on toes and hips with imbalance Absent AJ good reflexes Minimal right pronator drift   Physical exam: Exam: Gen: NAD, conversant, well nourised, obese, well groomed                     CV: RRR, no MRG. No Carotid Bruits. No peripheral edema, warm, nontender Eyes: Conjunctivae clear without exudates or hemorrhage  Neuro: Detailed Neurologic Exam  Speech:    Speech is normal; fluent and spontaneous with normal comprehension.  Cognition:    The patient is oriented to person, place, and time;     recent and remote memory intact;     language fluent;     normal attention, concentration,     fund of knowledge Cranial Nerves:    The pupils are equal, round, and reactive to light. The fundi are normal and spontaneous venous pulsations are present. Visual fields are full to finger confrontation. Extraocular movements are intact. Trigeminal sensation is intact and the muscles of mastication are normal. The face is symmetric. The palate elevates in the midline. Hearing intact. Voice is normal. Shoulder shrug is normal. The tongue has  normal motion without fasciculations.   Coordination:    Normal finger to nose and heel to shin. Normal rapid alternating movements.   Gait:    Heel-toe and tandem gait are normal.   Motor Observation:    No asymmetry, no atrophy, and no involuntary movements noted. Tone:    Normal muscle tone.    Posture:    Posture is normal. normal erect    Strength: Significant give way throughout due to pain. Pain on palpation of right shoulder joint and elbow. Left delt weakness due to pain bilat hip flexion weakness due to pain bilat LE giveway throughout due to pain  Sensation: intact to LT, pinprick in all extremities     Reflex Exam:  DTR's:    Deep tendon reflexes in the upper and lower extremities are symmetrical bilaterally.   Toes:    The toes are equivocal bilaterally.   Clonus:    Clonus is absent.   +right hip pain on right leg external rotation  Assessment/Plan: 76 year old female with significant arthritic pain who by report woke up with right arm numbness and pain. She presented to the [facility] just for this complaint and a stroke w/up was negative. Today she says she also has right leg, groin, hip, low back pain as well. I don't think the leg pain is associated with the arm symptoms. I think she has right hip pain and possibly sciatica (chronic). We can order x-rays of the right hip and if no etiology we can further order MRI of the lumbar spine. She also has significant pain on palpation of the right shoulder and right elbow and a normal sensory exam, I do wonder if patient's right arm symptoms primarily due to pain. MRI of the brain was negative for acute abnormalities or stroke. MRI of the cervical spine did show degenerative changes with possible nerve impingement however her clinical exam does not fit any distribution for radiculopathy as she says she is numb and weak throughout the entire arm in no particular myotome and dermatome distribution. Cannot perform a good  motor exam due to giveway throughout due  to pain. Daughter is here today and provides most information and says doctors have been giving her "the run around" and have been "a waste of time". Explained we will try to provide her with a thorough workup. EMG/NCS to eval for brachial plexopathy as well as cervical radiculopathy or mononeuropathy.  Emg/ncs of the right arm only; need to call to schedule xrays of the right hip need to find an imaging center close to her; maybe at [place]  (do we have access to those images? In [place].) Will discuss with referral team. Patient is to return to primary care physician and discuss pain management for her osteoarthritis which I think is significantly contributing to her symptoms but can't rule out radiculopathy or brachial plexopathy Recommended physical therapy  Sarina Ill, MD  [facility] [address] [place], [place] [address]  Phone [phone] Fax [phone]

## 2016-08-28 NOTE — Telephone Encounter (Signed)
Virginia Young, please see me tomorrow about this xray. She needs xrays of the right hip but we need to find an imaging center close to her; maybe at [place]  (do we have access to those images, in [place] since [facility]?)  Jen: We need to schedule Emg/ncs of the right arm only; need to call to schedule. Lets discuss several choices for them and call them tomorrow.  Thanks

## 2016-08-30 NOTE — Telephone Encounter (Signed)
Please contact patient's daughter [name] in reference to scheduling NCV/EMG.

## 2016-08-31 NOTE — Telephone Encounter (Signed)
Virginia Young says he can do either of these so please call and offer these 2 spots. Otherwise they will need to use first available that works for them. Also about the xray, there are a few options. We can order it at [facility]([address] for them) or they can have it done at [facility] here in town when they come for emg/ncs. Otherwise their pcp will need to order it if they want it done in [place] or [place]. thanks

## 2016-09-02 NOTE — Telephone Encounter (Signed)
Called daughter and left VM mssg letting her know that pt can have x-ray done at [facility] or [facility]. May call back to schedule work-in NCV/EMG on 3/29, 4/11 or 4/18.

## 2016-09-03 ENCOUNTER — Ambulatory Visit: Payer: Medicare Other | Admitting: Pulmonary Disease

## 2016-09-04 ENCOUNTER — Ambulatory Visit (INDEPENDENT_AMBULATORY_CARE_PROVIDER_SITE_OTHER): Payer: Medicare Other | Admitting: Pharmacist

## 2016-09-04 DIAGNOSIS — I4891 Unspecified atrial fibrillation: Secondary | ICD-10-CM | POA: Diagnosis not present

## 2016-09-04 DIAGNOSIS — Z5181 Encounter for therapeutic drug level monitoring: Secondary | ICD-10-CM | POA: Diagnosis not present

## 2016-09-04 DIAGNOSIS — I4819 Other persistent atrial fibrillation: Secondary | ICD-10-CM

## 2016-09-04 DIAGNOSIS — I481 Persistent atrial fibrillation: Secondary | ICD-10-CM | POA: Diagnosis not present

## 2016-09-04 LAB — COAGUCHEK XS/INR WAIVED
INR: 2.2 — ABNORMAL HIGH (ref 0.9–1.1)
Prothrombin Time: 26.6 s

## 2016-09-16 ENCOUNTER — Encounter: Payer: Self-pay | Admitting: Pharmacist

## 2016-09-18 ENCOUNTER — Telehealth: Payer: Self-pay | Admitting: Family

## 2016-09-20 ENCOUNTER — Other Ambulatory Visit: Payer: Self-pay | Admitting: Family Medicine

## 2016-09-27 NOTE — Telephone Encounter (Signed)
Aware to call us to schedule Mom an appointment for depression.

## 2016-09-27 NOTE — Telephone Encounter (Signed)
Left message with daughter to call .

## 2016-10-01 NOTE — Telephone Encounter (Signed)
Daughter is calling to get patient scheduled for physical therapy feels this will help her feel better, this is what she was trying to have done when she first called.  Call daughter [name] back either work # or [phone]

## 2016-10-01 NOTE — Telephone Encounter (Signed)
Left message on Pam's VM NTBS

## 2016-10-01 NOTE — Telephone Encounter (Signed)
Pt needs appt to be seen. 

## 2016-10-08 ENCOUNTER — Encounter: Payer: Self-pay | Admitting: Pharmacist

## 2016-10-08 ENCOUNTER — Ambulatory Visit (INDEPENDENT_AMBULATORY_CARE_PROVIDER_SITE_OTHER): Payer: Medicare Other | Admitting: Pharmacist

## 2016-10-08 DIAGNOSIS — I481 Persistent atrial fibrillation: Secondary | ICD-10-CM | POA: Diagnosis not present

## 2016-10-08 DIAGNOSIS — Z5181 Encounter for therapeutic drug level monitoring: Secondary | ICD-10-CM | POA: Diagnosis not present

## 2016-10-08 DIAGNOSIS — I4819 Other persistent atrial fibrillation: Secondary | ICD-10-CM

## 2016-10-08 LAB — COAGUCHEK XS/INR WAIVED
INR: 2.8 — ABNORMAL HIGH (ref 0.9–1.1)
Prothrombin Time: 33.4 s

## 2016-10-22 ENCOUNTER — Encounter: Payer: Self-pay | Admitting: Family

## 2016-10-22 ENCOUNTER — Ambulatory Visit (INDEPENDENT_AMBULATORY_CARE_PROVIDER_SITE_OTHER): Payer: Medicare Other | Admitting: Family

## 2016-10-22 VITALS — BP 148/85 | HR 68 | Temp 96.9°F | Ht 70.0 in | Wt 187.0 lb

## 2016-10-22 DIAGNOSIS — K5900 Constipation, unspecified: Secondary | ICD-10-CM | POA: Diagnosis not present

## 2016-10-22 DIAGNOSIS — N3001 Acute cystitis with hematuria: Secondary | ICD-10-CM

## 2016-10-22 DIAGNOSIS — R1030 Lower abdominal pain, unspecified: Secondary | ICD-10-CM | POA: Diagnosis not present

## 2016-10-22 LAB — CBC WITH DIFFERENTIAL/PLATELET
Basophils Absolute: 0.1 10*3/uL (ref 0.0–0.2)
Basos: 1 %
EOS (ABSOLUTE): 0.2 10*3/uL (ref 0.0–0.4)
Eos: 2 %
Hematocrit: 40.5 % (ref 34.0–46.6)
Hemoglobin: 13.2 g/dL (ref 11.1–15.9)
Immature Grans (Abs): 0 10*3/uL (ref 0.0–0.1)
Immature Granulocytes: 0 %
Lymphocytes Absolute: 1.9 10*3/uL (ref 0.7–3.1)
Lymphs: 21 %
MCH: 28.9 pg (ref 26.6–33.0)
MCHC: 32.6 g/dL (ref 31.5–35.7)
MCV: 89 fL (ref 79–97)
Monocytes Absolute: 0.8 10*3/uL (ref 0.1–0.9)
Monocytes: 9 %
Neutrophils Absolute: 5.9 10*3/uL (ref 1.4–7.0)
Neutrophils: 67 %
Platelets: 252 10*3/uL (ref 150–379)
RBC: 4.56 x10E6/uL (ref 3.77–5.28)
RDW: 14.1 % (ref 12.3–15.4)
WBC: 8.9 10*3/uL (ref 3.4–10.8)

## 2016-10-22 LAB — MICROSCOPIC EXAMINATION: WBC, UA: >30 /hpf — AB (ref 0–?)

## 2016-10-22 LAB — URINALYSIS, COMPLETE
Bilirubin, UA: NEGATIVE
Glucose, UA: NEGATIVE
Ketones, UA: NEGATIVE
Nitrite, UA: NEGATIVE
Protein, UA: NEGATIVE
Specific Gravity, UA: 1.015 (ref 1.005–1.030)
Urobilinogen, Ur: 0.2 mg/dL (ref 0.2–1.0)
pH, UA: 7 (ref 5.0–7.5)

## 2016-10-22 MED ORDER — NITROFURANTOIN MONOHYD MACRO 100 MG PO CAPS: 100.0000 mg | ORAL_CAPSULE | Freq: Two times a day (BID) | ORAL | 0 refills | Status: DC

## 2016-10-22 MED ORDER — LUBIPROSTONE 24 MCG PO CAPS: 24.0000 ug | ORAL_CAPSULE | Freq: Two times a day (BID) | ORAL | 1 refills | Status: DC

## 2016-10-22 NOTE — Addendum Note (Signed)
Addended by: Evelina Dun A on: 10/22/2016 01:58 PM   Modules accepted: Orders

## 2016-10-22 NOTE — Addendum Note (Signed)
Addended by: Evelina Dun A on: 10/22/2016 01:55 PM   Modules accepted: Orders

## 2016-10-22 NOTE — Patient Instructions (Signed)

## 2016-10-22 NOTE — Progress Notes (Addendum)
   Subjective:    Patient ID: Virginia Young, female    DOB: Nov 11, 1940, 76 y.o.   MRN: [id number]  Abdominal Pain  This is a new problem. The problem occurs intermittently. The problem has been waxing and waning. The pain is located in the RLQ. The pain is at a severity of 3/10. The pain is mild. The abdominal pain does not radiate. Associated symptoms include constipation, frequency and vomiting. Pertinent negatives include no belching, diarrhea, dysuria, hematuria or nausea. The pain is aggravated by movement. The pain is relieved by being still. She has tried acetaminophen for the symptoms. The treatment provided no relief.      Review of Systems  Gastrointestinal: Positive for abdominal pain, constipation and vomiting. Negative for diarrhea and nausea.  Genitourinary: Positive for frequency. Negative for dysuria and hematuria.  All other systems reviewed and are negative.      Objective:   Physical Exam  Constitutional: She is oriented to person, place, and time. She appears well-developed and well-nourished. No distress.  HENT:  Head: Normocephalic and atraumatic.  Eyes: Pupils are equal, round, and reactive to light.  Neck: Normal range of motion. Neck supple. No thyromegaly present.  Cardiovascular: Normal rate, regular rhythm, normal heart sounds and intact distal pulses.   No murmur heard. Pulmonary/Chest: Effort normal and breath sounds normal. No respiratory distress. She has no wheezes.  Abdominal: Soft. Bowel sounds are normal. She exhibits no distension. There is tenderness (mild lower abd tenderness).  Musculoskeletal: Normal range of motion. She exhibits no edema or tenderness.  Neurological: She is alert and oriented to person, place, and time.  Skin: Skin is warm and dry.  Psychiatric: She has a normal mood and affect. Her behavior is normal. Judgment and thought content normal.  Vitals reviewed.   BP (!) 148/85   Pulse 68   Temp (!) 96.9 F (36.1 C) (Oral)    Ht 5\' 10"  (1.778 m)   Wt 187 lb (84.8 kg)   BMI 26.83 kg/m        Assessment & Plan:  1. Lower abdominal pain - Urinalysis, Complete - CBC with Differential/Platelet  2. Constipation, unspecified constipation type -Continue Miralax Force fluids Pt started on amitiza today CBC pending, pt had KUB on 02/18 with moderate stool burden  - lubiprostone (AMITIZA) 24 MCG capsule; Take 1 capsule (24 mcg total) by mouth 2 (two) times daily with a meal.  Dispense: 90 capsule; Refill: 1  *Addendum:Pt was unable to leave urine specimen during. She brought back specimen and it is positive for UTI. Will send Macrobid Prescription  to pharmacy.  Evelina Dun, FNP

## 2016-10-24 ENCOUNTER — Ambulatory Visit: Payer: Medicare Other | Admitting: Family

## 2016-10-25 ENCOUNTER — Other Ambulatory Visit: Payer: Self-pay | Admitting: Family

## 2016-10-25 MED ORDER — LINACLOTIDE 72 MCG PO CAPS: 72.0000 ug | ORAL_CAPSULE | Freq: Every day | ORAL | 1 refills | Status: DC

## 2016-10-25 NOTE — Progress Notes (Signed)
Insurance denied amitiza. Will send in [place] to see if this is covered.

## 2016-10-28 NOTE — Progress Notes (Signed)
Left message for patient to call.

## 2016-11-05 ENCOUNTER — Other Ambulatory Visit: Payer: Self-pay | Admitting: Family Medicine

## 2016-11-07 ENCOUNTER — Ambulatory Visit: Payer: Self-pay | Admitting: Family Medicine

## 2016-11-11 ENCOUNTER — Telehealth: Payer: Self-pay | Admitting: *Deleted

## 2016-11-11 DIAGNOSIS — M159 Polyosteoarthritis, unspecified: Secondary | ICD-10-CM

## 2016-11-11 DIAGNOSIS — M150 Primary generalized (osteo)arthritis: Principal | ICD-10-CM

## 2016-11-11 DIAGNOSIS — G8929 Other chronic pain: Secondary | ICD-10-CM

## 2016-11-11 DIAGNOSIS — M25559 Pain in unspecified hip: Secondary | ICD-10-CM

## 2016-11-11 DIAGNOSIS — M8949 Other hypertrophic osteoarthropathy, multiple sites: Secondary | ICD-10-CM

## 2016-11-11 DIAGNOSIS — M545 Low back pain: Secondary | ICD-10-CM

## 2016-11-11 NOTE — Telephone Encounter (Signed)
Patient requesting physical therapy next door.  She has hip pain and has degenerative bone problems. Please call her daughter with the appointment time.  [name] [phone])

## 2016-11-12 NOTE — Telephone Encounter (Signed)
Physical therapy ordered

## 2016-11-13 NOTE — Progress Notes (Signed)
Aware. 

## 2016-11-15 ENCOUNTER — Other Ambulatory Visit: Payer: Self-pay | Admitting: Pediatrics

## 2016-11-15 ENCOUNTER — Ambulatory Visit (INDEPENDENT_AMBULATORY_CARE_PROVIDER_SITE_OTHER): Payer: Medicare Other

## 2016-11-15 ENCOUNTER — Ambulatory Visit (INDEPENDENT_AMBULATORY_CARE_PROVIDER_SITE_OTHER): Payer: Medicare Other | Admitting: Pediatrics

## 2016-11-15 ENCOUNTER — Encounter: Payer: Self-pay | Admitting: Pediatrics

## 2016-11-15 ENCOUNTER — Other Ambulatory Visit: Payer: Self-pay | Admitting: Neurology

## 2016-11-15 VITALS — BP 131/82 | HR 81 | Temp 97.0°F | Ht 70.0 in | Wt 186.0 lb

## 2016-11-15 DIAGNOSIS — N644 Mastodynia: Secondary | ICD-10-CM

## 2016-11-15 DIAGNOSIS — M25551 Pain in right hip: Secondary | ICD-10-CM

## 2016-11-15 DIAGNOSIS — L309 Dermatitis, unspecified: Secondary | ICD-10-CM | POA: Diagnosis not present

## 2016-11-15 DIAGNOSIS — M25511 Pain in right shoulder: Secondary | ICD-10-CM | POA: Diagnosis not present

## 2016-11-15 DIAGNOSIS — M2550 Pain in unspecified joint: Secondary | ICD-10-CM

## 2016-11-15 DIAGNOSIS — M79601 Pain in right arm: Secondary | ICD-10-CM

## 2016-11-15 DIAGNOSIS — G8929 Other chronic pain: Secondary | ICD-10-CM

## 2016-11-15 MED ORDER — TRIAMCINOLONE ACETONIDE 0.025 % EX OINT: 1.0000 "application " | TOPICAL_OINTMENT | Freq: Two times a day (BID) | CUTANEOUS | 0 refills | Status: DC

## 2016-11-15 NOTE — Progress Notes (Signed)
  Subjective:   Patient ID: Virginia Young, female    DOB: [birth year], 76 y.o.   MRN: [id number] CC: Arm Pain (right) and Abdominal Pain (right)  HPI: Virginia Young is a 76 y.o. female presenting for Arm Pain (right) and Abdominal Pain (right)  R breast pain for past few weeks, comes and goes Sometimes R breast is hard Sometimes swells 3 times the size of other breast No fevers No nipple discharge No L breast pain Normal appetite Feeling achy especially on R side Arm, R hand bother her all the time Has a hard time using cane in R hand Pt is R handed Has had surgery on R arm after fracture some years ago R hip bothers her a lot as well No recent falls  Spot on L upper leg is red and flaky at times, not recently been itchy  Relevant past medical, surgical, family and social history reviewed. Allergies and medications reviewed and updated. History  Smoking Status  . Never Smoker  Smokeless Tobacco  . Never Used   ROS: Per HPI   Objective:    BP 131/82   Pulse 81   Temp 97 F (36.1 C) (Oral)   Ht 5\' 10"  (1.778 m)   Wt 186 lb (84.4 kg)   BMI 26.69 kg/m   Wt Readings from Last 3 Encounters:  11/15/16 186 lb (84.4 kg)  10/22/16 187 lb (84.8 kg)  08/28/16 190 lb 12.8 oz (86.5 kg)    Gen: NAD, alert, cooperative with exam, NCAT EYES: EOMI, no conjunctival injection, or no icterus ENT: OP without erythema LYMPH: no cervical LAD CV: NRRR, normal S1/S2 Resp: CTABL, no wheezes, normal WOB Abd: +BS, soft, NTND. Ext: No edema, warm Neuro: Alert and oriented MSK: some ulnar deviation of fingers R hand, no redness, enlarged MCP and PIP joints throughout hands, R hand > L hand Breast: R breast TTP medially and lowerareas, normal in appearance, no induration or masses, normal appearing areola b/l Nl L breast exam Skin: apprx 1.5cm slightly raised red plaque with scale L upper thigh  Assessment & Plan:  [name] was seen today for arm pain and abdominal  pain.  Diagnoses and all orders for this visit:  Pain of right hip joint -     DG HIP UNILAT W OR W/O PELVIS 2-3 VIEWS RIGHT; Future  Chronic right shoulder pain Xray with arthritis done 3 months ago aspercream daily as needed  Breast pain, right TTP, no induration, does not appear to have cellulitis -     MM DIAG BREAST TOMO BILATERAL; Future  Arthralgia, unspecified joint Following with neurology for ongoing work up of RUE pain, R hip pain Will get R hip xray as above Neg MRI of brain and cervical spine Has upcoming nerve conduction study, EMG -     Sedimentation Rate -     C-reactive protein -     Rheumatoid factor -     CYCLIC CITRUL PEPTIDE ANTIBODY, IGG/IGA -     CBC with Differential/Platelet  Eczema, unspecified type Any worsening of rash let me know -     triamcinolone (KENALOG) 0.025 % ointment; Apply 1 application topically 2 (two) times daily.   Follow up plan: Return in about 4 weeks (around 12/13/2016). Assunta Found, MD [place]

## 2016-11-17 LAB — CBC WITH DIFFERENTIAL/PLATELET
Basophils Absolute: 0.1 10*3/uL (ref 0.0–0.2)
Basos: 1 %
EOS (ABSOLUTE): 0.1 10*3/uL (ref 0.0–0.4)
Eos: 1 %
Hematocrit: 41.2 % (ref 34.0–46.6)
Hemoglobin: 13.4 g/dL (ref 11.1–15.9)
Immature Grans (Abs): 0 10*3/uL (ref 0.0–0.1)
Immature Granulocytes: 0 %
Lymphocytes Absolute: 1.6 10*3/uL (ref 0.7–3.1)
Lymphs: 21 %
MCH: 28.7 pg (ref 26.6–33.0)
MCHC: 32.5 g/dL (ref 31.5–35.7)
MCV: 88 fL (ref 79–97)
Monocytes Absolute: 0.5 10*3/uL (ref 0.1–0.9)
Monocytes: 6 %
Neutrophils Absolute: 5.5 10*3/uL (ref 1.4–7.0)
Neutrophils: 71 %
Platelets: 253 10*3/uL (ref 150–379)
RBC: 4.67 x10E6/uL (ref 3.77–5.28)
RDW: 14.2 % (ref 12.3–15.4)
WBC: 7.7 10*3/uL (ref 3.4–10.8)

## 2016-11-17 LAB — SEDIMENTATION RATE: Sed Rate: 5 mm/hr (ref 0–40)

## 2016-11-17 LAB — RHEUMATOID FACTOR: Rhuematoid fact SerPl-aCnc: <10 IU/mL (ref 0.0–13.9)

## 2016-11-17 LAB — C-REACTIVE PROTEIN: CRP: 1.6 mg/L (ref 0.0–4.9)

## 2016-11-17 LAB — CYCLIC CITRUL PEPTIDE ANTIBODY, IGG/IGA: Cyclic Citrullin Peptide Ab: 27 units — ABNORMAL HIGH (ref 0–19)

## 2016-11-19 ENCOUNTER — Encounter: Payer: Self-pay | Admitting: Pediatrics

## 2016-11-19 DIAGNOSIS — L814 Other melanin hyperpigmentation: Secondary | ICD-10-CM | POA: Diagnosis not present

## 2016-11-19 DIAGNOSIS — D1801 Hemangioma of skin and subcutaneous tissue: Secondary | ICD-10-CM | POA: Diagnosis not present

## 2016-11-19 DIAGNOSIS — N644 Mastodynia: Secondary | ICD-10-CM | POA: Diagnosis not present

## 2016-11-19 DIAGNOSIS — C44729 Squamous cell carcinoma of skin of left lower limb, including hip: Secondary | ICD-10-CM | POA: Diagnosis not present

## 2016-11-19 DIAGNOSIS — L821 Other seborrheic keratosis: Secondary | ICD-10-CM | POA: Diagnosis not present

## 2016-11-19 DIAGNOSIS — D485 Neoplasm of uncertain behavior of skin: Secondary | ICD-10-CM | POA: Diagnosis not present

## 2016-11-19 DIAGNOSIS — D225 Melanocytic nevi of trunk: Secondary | ICD-10-CM | POA: Diagnosis not present

## 2016-11-19 DIAGNOSIS — L570 Actinic keratosis: Secondary | ICD-10-CM | POA: Diagnosis not present

## 2016-11-20 ENCOUNTER — Ambulatory Visit (INDEPENDENT_AMBULATORY_CARE_PROVIDER_SITE_OTHER): Payer: Self-pay | Admitting: Neurology

## 2016-11-20 ENCOUNTER — Ambulatory Visit (INDEPENDENT_AMBULATORY_CARE_PROVIDER_SITE_OTHER): Payer: Medicare Other | Admitting: Neurology

## 2016-11-20 DIAGNOSIS — R200 Anesthesia of skin: Secondary | ICD-10-CM

## 2016-11-20 DIAGNOSIS — Z0289 Encounter for other administrative examinations: Secondary | ICD-10-CM

## 2016-11-20 DIAGNOSIS — R202 Paresthesia of skin: Secondary | ICD-10-CM

## 2016-11-20 DIAGNOSIS — M79601 Pain in right arm: Secondary | ICD-10-CM

## 2016-11-20 NOTE — Progress Notes (Signed)
See procedure note.

## 2016-11-21 ENCOUNTER — Ambulatory Visit: Payer: Medicare Other | Attending: Family | Admitting: Physical Therapy

## 2016-11-21 DIAGNOSIS — M545 Low back pain: Secondary | ICD-10-CM | POA: Insufficient documentation

## 2016-11-21 DIAGNOSIS — G8929 Other chronic pain: Secondary | ICD-10-CM | POA: Diagnosis not present

## 2016-11-21 DIAGNOSIS — M6281 Muscle weakness (generalized): Secondary | ICD-10-CM | POA: Diagnosis not present

## 2016-11-21 DIAGNOSIS — M25511 Pain in right shoulder: Secondary | ICD-10-CM | POA: Diagnosis not present

## 2016-11-21 DIAGNOSIS — R293 Abnormal posture: Secondary | ICD-10-CM | POA: Insufficient documentation

## 2016-11-21 NOTE — Therapy (Signed)
[facility] [place], [place], [address] Phone: [phone]   Fax:  [phone]  Physical Therapy Treatment  Patient Details  Name: Virginia Young MRN: [id number] Date of Birth: [birth year] Referring Provider: Evelina Dun  Encounter Date: 11/21/2016      PT End of Session - 11/21/16 1408    Visit Number 1   Number of Visits 16   Date for PT Re-Evaluation 01/20/17   PT Start Time 0945   PT Stop Time 1042   PT Time Calculation (min) 57 min   Activity Tolerance Patient tolerated treatment well   Behavior During Therapy [facility] for tasks assessed/performed      Past Medical History:  Diagnosis Date  . Allergy   . Atrial fibrillation (HCC)    a. coumadin;  b. Amiodarone  . Cataract   . Chest pain 03/2012   a. Lex MV 10/13:  EF 64%, dist ant and apical defect sugg of soft tissue atten, no ischemia  . Chronic systolic heart failure ([place])   . Depression   . GERD (gastroesophageal reflux disease)   . HLD (hyperlipidemia)   . Incontinence of urine   . Mild mitral regurgitation   . MS (multiple sclerosis) ([place])   . NICM (nonischemic cardiomyopathy) ([place])    a. neg CLite in 2003;  b. EF 40-45% in past;   c.  Echo 12/11: EF 35-40%, mild MR, mild LAE, mild RAE, small pericardial effusion   . Osteoarthritis   . Osteoporosis   . Stasis ulcer ([place])   . Uterine prolapse   . Vaginal prolapse without uterine prolapse   . Varicose veins     Past Surgical History:  Procedure Laterality Date  . ANTERIOR AND POSTERIOR VAGINAL REPAIR W/ SACROSPINOUS LIGAMENT SUSPENSION  2016   [facility]  . BLADDER SURGERY    . cataract surgery Bilateral   . EYE SURGERY    . INGUINAL HERNIA REPAIR     right  . LIPOMA EXCISION     h/o removed from upper back x 2    There were no vitals filed for this visit.      Subjective Assessment - 11/21/16 1241    Subjective The patient presents to OPPT with a CC of righr shoulder pain and right hip  pain that have been ongoing and worsening over several months.  Her resting pain-level in both regions is rated at a 6/10 today but can rise to 7-8+/10 with increased activity.  She had an injection in her right shoulder but didnot seem to help that much.  The patient's sleep is disturbed due to pain  Her pain radiates from her right low back region into her right buttock.  She also reports some numbness over right forearm but wonders if this is related to a previous left elbow fracture.  She c/o bilateral knee pain due to arthritis and wears a brace on her right knee.   Limitations Walking   How long can you stand comfortably? <10 minutes.   How long can you walk comfortably? Short distances.   Diagnostic tests NCV test to right UE.  X-rays   Patient Stated Goals Move better with less pain.   Currently in Pain? Yes   Pain Score 6    Pain Location Shoulder   Pain Orientation Right   Pain Descriptors / Indicators Aching;Throbbing   Pain Type Chronic pain   Pain Onset More than a month ago   Pain Frequency Constant   Aggravating Factors  See above.   Pain Relieving Factors See above.   Multiple Pain Sites Yes   Pain Score 6   Pain Location Back   Pain Orientation Right   Pain Descriptors / Indicators Aching;Throbbing;Constant   Pain Type Chronic pain   Pain Onset More than a month ago   Pain Frequency Constant   Aggravating Factors  See above.   Pain Relieving Factors See above.            [facility] PT Assessment - 11/21/16 0001      Assessment   Medical Diagnosis OA involving multiple joints.   Referring Provider Evelina Dun   Onset Date/Surgical Date --  Ongoing.     Precautions   Precautions --  MS; OP.     Restrictions   Weight Bearing Restrictions No     Balance Screen   Has the patient fallen in the past 6 months No   Has the patient had a decrease in activity level because of a fear of falling?  No   Is the patient reluctant to leave their home because of a fear  of falling?  No     Home Environment   Living Environment Private residence     Prior Function   Level of Independence Independent     Posture/Postural Control   Posture/Postural Control Postural limitations   Postural Limitations Rounded Shoulders;Forward head;Decreased lumbar lordosis;Increased thoracic kyphosis;Flexed trunk   Posture Comments Scoliosis; right rib hump; bilateral genu varum and right knee held in flexion; left iliac creast higher than right.     ROM / Strength   AROM / PROM / Strength AROM;Strength     AROM   Overall AROM Comments Patient achieved -10 degrees of lumbar extension.  Right shoulder active flexion to 125 degrees and passive to 145 degrees, ER= 60 degrees active and 75 degrees pasive, behind back motion is WNL.     Strength   Overall Strength Comments Right shoulder flexion and abduction= 4-/5; ER= 4-/5; IR= 4/5.  Right hip flexion= 4-/5; left hip flexion= 4/5; right knee extension= 4-/5 and left knee extension= 4/5.     Palpation   Palpation comment Tender to palpation over right acromial ridge with referred pain into right middle deltoid region.  Pain with palpation over right SIJ and right lower lumbar musculature with pain referred toward right hip and buttocks.     Special Tests    Special Tests Cervical  (-) LT shld impingement.  Norm LE DTR's.(-) Slump test on RT   Cervical Tests --  UE DTR's ar eintact.     Transfers   Five time sit to stand comments  --  With use of armrests.     Ambulation/Gait   Gait Pattern Decreased step length - right;Decreased step length - left;Decreased stride length;Decreased hip/knee flexion - right;Decreased hip/knee flexion - left;Right flexed knee in stance;Lateral trunk lean to right;Trunk flexed   Gait Comments Patient uses a straight cane.                     [facility] Adult PT Treatment/Exercise - 11/21/16 0001      Modalities   Modalities Electrical Stimulation     Electrical Stimulation    Electrical Stimulation Location Right shoulder and right LB/SIJ region.   Electrical Stimulation Action Constant Pre-mod.   Electrical Stimulation Parameters 80-150 Hz x 15 minutes.   Electrical Stimulation Goals Pain  PT Short Term Goals - 12/08/16 1349      PT SHORT TERM GOAL #1   Title STG's=LTG's.           PT Long Term Goals - 12/08/2016 1405      PT LONG TERM GOAL #7   Title Sleep undisturbed 6 hours.   Time 8   Period Weeks   Status New               Plan - 12/08/16 1315    Clinical Impression Statement The patient presents to OPPt with c/o chronic right shoulder pain and right sided lower back pain with radiation into right hip/buttock region.  She has significantly postural abnormalites and a loss of right shouler range ofm otion and strength.  Her deficits prevent her from performing ADL's, sleeping well and walkng and standing for loner periods of time.  Her functional mobility is impaired and she requires a cane for safe ambulation.  Patient will benefit from skilled physical therapy to address deficits to include modalites for pain reduction and progression into appropriate therapeutic exercise.   History and Personal Factors relevant to plan of care: MS; OP; OA.   Clinical Presentation Evolving   Clinical Decision Making Moderate   PT Frequency 2x / week   PT Duration 8 weeks   PT Treatment/Interventions ADLs/Self Care Home Management;Cryotherapy;Electrical Stimulation;Ultrasound;Moist Heat;Functional mobility training;Therapeutic activities;Therapeutic exercise;Neuromuscular re-education;Patient/family education;Passive range of motion;Vasopneumatic Device;Manual techniques   PT Next Visit Plan Modalites to right shoulder and right low back and right SIJ region.  Stw/m to both areas..  Right shoulder AAROM; isometrics; postural exercises.  No spinal loading.   Consulted and Agree with Plan of Care Patient      Patient will  benefit from skilled therapeutic intervention in order to improve the following deficits and impairments:     Visit Diagnosis: Chronic right shoulder pain - Plan: PT plan of care cert/re-cert  Chronic right-sided low back pain without sciatica - Plan: PT plan of care cert/re-cert  Abnormal posture - Plan: PT plan of care cert/re-cert  Muscle weakness (generalized) - Plan: PT plan of care cert/re-cert       G-Codes - Dec 08, 2016 1252    Functional Assessment Tool Used (Outpatient Only) FOTO...67% limitation.   Functional Limitation Self care   Self Care Current Status [phone]) At least 60 percent but less than 80 percent impaired, limited or restricted   Self Care Goal Status ([id number]) At least 20 percent but less than 40 percent impaired, limited or restricted      Problem List Patient Active Problem List   Diagnosis Date Noted  . HTN (hypertension) 02/19/2016  . Post-nasal drip 05/15/2015  . Postoperative abdominal pain 12/07/2014  . Voiding dysfunction 10/07/2014  . SUI (stress urinary incontinence, female) 10/07/2014  . Osteoporosis with fracture 04/06/2014  . Impacted cerumen of both ears 08/20/2013  . Encounter for therapeutic drug monitoring 08/20/2013  . DOE (dyspnea on exertion) 07/23/2013  . Helicobacter positive gastritis 06/21/2013  . Orthostatic hypotension 06/10/2013  . UTI (lower urinary tract infection) 05/06/2013  . Neck mass 02/19/2013  . Neck pain, bilateral 02/19/2013  . Right groin mass 11/08/2011  . Low back pain 11/08/2011  . Lipoma of neck 06/21/2011  . Memory loss 06/21/2011  . OVERACTIVE BLADDER 08/21/2010  . Long term current use of anticoagulant 07/25/2010  . Osteoarthritis 01/18/2010  . KNEE PAIN 01/18/2010  . FOOT PAIN 01/18/2010  . Hyperlipidemia with target LDL less than 100 04/20/2009  . GERD  04/17/2009  . MITRAL REGURGITATION, MILD 08/30/2008  . NICM (nonischemic cardiomyopathy) ([place]) 08/30/2008  . Congestive heart failure ([place])  04/13/2007  . DEPRESSION 04/10/2007  . DISEASE, MITRAL VALVE NEC/NOS 04/10/2007  . Atrial fibrillation ([place]) 04/10/2007  . STASIS ULCER 04/10/2007  . VENOUS INSUFFICIENCY, LEGS 04/10/2007    Frona Yost, [place] MPT 11/21/2016, 2:11 PM  [facility] [place], [place], [address] Phone: [phone]   Fax:  [phone]  Name: Virginia Young MRN: [id number] Date of Birth: December 07, 1940

## 2016-11-21 NOTE — Procedures (Signed)
Full Name: Aira Sallade Gender: Female MRN #: [id number] Date of Birth: June 21, 2041    Visit Date: 11/20/16 10:13 Age: 76 Years 19 Months Old Examining Physician: Sarina Ill, MD  Referring Physician: Kenn File, MD    History: 76 year old female with significant arthritic pain who by report woke up with right arm numbness and pain. She presented to the [facility] just for this complaint and a stroke w/up was negative. MRI of the cervical spine showed degenerative cervical spondylosis with multilevel disc and facet disease but no nerve root impingement to explain patient's right arm symptoms.  Summary: All nerves and muscles (as detailed in the following tables) were within normal limits.  Conclusion: This is a normal study of the right upper extremity.  Cc: Dr. Reece Agar M.D.  [facility] [place], [place] [address] Tel: [phone] Fax: [phone]        [facility]    Nerve / Sites Muscle Latency Ref. Amplitude Ref. Rel Amp Segments Distance Velocity Ref. Area    ms ms mV mV %  cm m/s m/s mVms  R Median - APB     Wrist APB 3.9 ?4.4 4.0 ?4.0 100 Wrist - APB 7   14.1     Upper arm APB 8.4  3.9  97.4 Upper arm - Wrist 24 52 ?49 13.8  R Ulnar - ADM     Wrist ADM 3.0 ?3.3 6.4 ?6.0 100 Wrist - ADM 7   18.2     B.Elbow ADM 6.7  5.4  84.4 B.Elbow - Wrist 20 55 ?49 16.8     A.Elbow ADM 8.6  5.0  92.4 A.Elbow - B.Elbow 10 52 ?49 17.8         A.Elbow - Wrist             SNC    Nerve / Sites Rec. Site Peak Lat Ref.  Amp Ref. Segments Distance Peak Diff Ref.    ms ms V V  cm ms ms  R Median, Ulnar - Transcarpal comparison     Median Palm Wrist 2.0 ?2.2 37 ?35 Median Palm - Wrist 8       Ulnar Palm Wrist 2.0 ?2.2 16 ?12 Ulnar Palm - Wrist 8          Median Palm - Ulnar Palm  0.0 ?0.4  R Median - Orthodromic (Dig II, Mid palm)     Dig II Wrist 3.2 ?3.4 9 ?10 Dig II - Wrist 13    R Ulnar - Orthodromic, (Dig V, Mid palm)   Dig V Wrist 2.7 ?3.1 8 ?5 Dig V - Wrist 70             F  Wave    Nerve F Lat Ref.   ms ms  R Ulnar - ADM 29.2 ?32.0       EMG full       EMG Summary Table    Spontaneous MUAP Recruitment  Muscle IA Fib PSW Fasc Other Amp Dur. Poly Pattern  R. Deltoid Normal None None None _______ Normal Normal Normal Normal  R. Biceps brachii Normal None None None _______ Normal Normal Normal Normal  R. Triceps brachii Normal None None None _______ Normal Normal Normal Normal  R. Pronator teres Normal None None None _______ Normal Normal Normal Normal  R. First dorsal interosseous Normal None None None _______ Normal Normal Normal Normal  R. Opponens pollicis Normal None None None _______  Normal Normal Normal Normal  R. Supraspinatus Normal None None None _______ Normal Normal Normal Normal  R. Infraspinatus Normal None None None _______ Normal Normal Normal Normal  R. Cervical paraspinals (low) Normal None None None _______ Normal Normal Normal Normal

## 2016-11-21 NOTE — Progress Notes (Signed)
Full Name: Kari Montero Gender: Female MRN #: [id number] Date of Birth: 05-Dec-2040    Visit Date: 11/20/16 10:13 Age: 76 Years 26 Months Old Examining Physician: Sarina Ill, MD  Referring Physician: Kenn File, MD    History: 76 year old female with significant arthritic pain who by report woke up with right arm numbness and pain. She presented to the [facility] just for this complaint and a stroke w/up was negative. MRI of the cervical spine showed degenerative cervical spondylosis with multilevel disc and facet disease but no nerve root impingement to explain patient's right arm symptoms.  Summary: All nerves and muscles (as detailed in the following tables) were within normal limits.  Conclusion: This is a normal study of the right upper extremity.  Cc: Dr. Reece Agar M.D.  [facility] [place], [place] [address] Tel: [phone] Fax: [phone]        [facility]    Nerve / Sites Muscle Latency Ref. Amplitude Ref. Rel Amp Segments Distance Velocity Ref. Area    ms ms mV mV %  cm m/s m/s mVms  R Median - APB     Wrist APB 3.9 ?4.4 4.0 ?4.0 100 Wrist - APB 7   14.1     Upper arm APB 8.4  3.9  97.4 Upper arm - Wrist 24 52 ?49 13.8  R Ulnar - ADM     Wrist ADM 3.0 ?3.3 6.4 ?6.0 100 Wrist - ADM 7   18.2     B.Elbow ADM 6.7  5.4  84.4 B.Elbow - Wrist 20 55 ?49 16.8     A.Elbow ADM 8.6  5.0  92.4 A.Elbow - B.Elbow 10 52 ?49 17.8         A.Elbow - Wrist             SNC    Nerve / Sites Rec. Site Peak Lat Ref.  Amp Ref. Segments Distance Peak Diff Ref.    ms ms V V  cm ms ms  R Median, Ulnar - Transcarpal comparison     Median Palm Wrist 2.0 ?2.2 37 ?35 Median Palm - Wrist 8       Ulnar Palm Wrist 2.0 ?2.2 16 ?12 Ulnar Palm - Wrist 8          Median Palm - Ulnar Palm  0.0 ?0.4  R Median - Orthodromic (Dig II, Mid palm)     Dig II Wrist 3.2 ?3.4 9 ?10 Dig II - Wrist 13    R Ulnar - Orthodromic, (Dig V, Mid palm)   Dig V Wrist 2.7 ?3.1 8 ?5 Dig V - Wrist 64             F  Wave    Nerve F Lat Ref.   ms ms  R Ulnar - ADM 29.2 ?32.0       EMG full       EMG Summary Table    Spontaneous MUAP Recruitment  Muscle IA Fib PSW Fasc Other Amp Dur. Poly Pattern  R. Deltoid Normal None None None _______ Normal Normal Normal Normal  R. Biceps brachii Normal None None None _______ Normal Normal Normal Normal  R. Triceps brachii Normal None None None _______ Normal Normal Normal Normal  R. Pronator teres Normal None None None _______ Normal Normal Normal Normal  R. First dorsal interosseous Normal None None None _______ Normal Normal Normal Normal  R. Opponens pollicis Normal None None None _______  Normal Normal Normal Normal  R. Supraspinatus Normal None None None _______ Normal Normal Normal Normal  R. Infraspinatus Normal None None None _______ Normal Normal Normal Normal  R. Cervical paraspinals (low) Normal None None None _______ Normal Normal Normal Normal

## 2016-11-22 ENCOUNTER — Other Ambulatory Visit: Payer: Self-pay | Admitting: Cardiology

## 2016-11-25 ENCOUNTER — Ambulatory Visit: Payer: Medicare Other | Attending: Family | Admitting: Physical Therapy

## 2016-11-25 DIAGNOSIS — M545 Low back pain, unspecified: Secondary | ICD-10-CM

## 2016-11-25 DIAGNOSIS — R293 Abnormal posture: Secondary | ICD-10-CM | POA: Diagnosis not present

## 2016-11-25 DIAGNOSIS — G8929 Other chronic pain: Secondary | ICD-10-CM

## 2016-11-25 DIAGNOSIS — M25511 Pain in right shoulder: Secondary | ICD-10-CM | POA: Diagnosis not present

## 2016-11-25 DIAGNOSIS — M6281 Muscle weakness (generalized): Secondary | ICD-10-CM

## 2016-11-25 NOTE — Therapy (Signed)
[facility] [place], [place], [address] Phone: [phone]   Fax:  [phone]  Physical Therapy Treatment  Patient Details  Name: Virginia Young MRN: [id number] Date of Birth: 30-Sep-1940 Referring Provider: Evelina Dun  Encounter Date: 11/25/2016      PT End of Session - 11/25/16 0842    Visit Number 2   Number of Visits 16   Date for PT Re-Evaluation 01/20/17   PT Start Time 1975   PT Stop Time 0939   PT Time Calculation (min) 45 min   Activity Tolerance Patient tolerated treatment well   Behavior During Therapy [facility] for tasks assessed/performed      Past Medical History:  Diagnosis Date  . Allergy   . Atrial fibrillation (HCC)    a. coumadin;  b. Amiodarone  . Cataract   . Chest pain 03/2012   a. Lex MV 10/13:  EF 64%, dist ant and apical defect sugg of soft tissue atten, no ischemia  . Chronic systolic heart failure ([place])   . Depression   . GERD (gastroesophageal reflux disease)   . HLD (hyperlipidemia)   . Incontinence of urine   . Mild mitral regurgitation   . MS (multiple sclerosis) ([place])   . NICM (nonischemic cardiomyopathy) ([place])    a. neg CLite in 2003;  b. EF 40-45% in past;   c.  Echo 12/11: EF 35-40%, mild MR, mild LAE, mild RAE, small pericardial effusion   . Osteoarthritis   . Osteoporosis   . Stasis ulcer ([place])   . Uterine prolapse   . Vaginal prolapse without uterine prolapse   . Varicose veins     Past Surgical History:  Procedure Laterality Date  . ANTERIOR AND POSTERIOR VAGINAL REPAIR W/ SACROSPINOUS LIGAMENT SUSPENSION  2016   [facility]  . BLADDER SURGERY    . cataract surgery Bilateral   . EYE SURGERY    . INGUINAL HERNIA REPAIR     right  . LIPOMA EXCISION     h/o removed from upper back x 2    There were no vitals filed for this visit.      Subjective Assessment - 11/25/16 0855    Subjective Reports that her shoulder and back feel about the same today.    Pertinent  History MS; OP.   Limitations Walking   How long can you stand comfortably? <10 minutes.   How long can you walk comfortably? Short distances.   Diagnostic tests NCV test to right UE.  X-rays   Patient Stated Goals Move better with less pain.   Currently in Pain? Yes   Pain Score 3    Pain Location Shoulder   Pain Orientation Right   Pain Descriptors / Indicators Discomfort;Other (Comment)  Pop intermittantly   Pain Type Chronic pain   Pain Onset More than a month ago   Pain Frequency Intermittent   Aggravating Factors  Movement   Pain Score 5   Pain Location Back   Pain Orientation Right;Lower   Pain Descriptors / Indicators Aching;Throbbing   Pain Type Chronic pain   Pain Radiating Towards to R hip   Pain Onset More than a month ago   Pain Frequency Constant            [facility] PT Assessment - 11/25/16 0001      Assessment   Medical Diagnosis OA involving multiple joints.     Restrictions   Weight Bearing Restrictions No  [facility] Adult PT Treatment/Exercise - 11/25/16 0001      Exercises   Exercises Shoulder;Lumbar     Lumbar Exercises: Aerobic   Stationary Bike NuStep L5 x10 min UE/LE involvement     Lumbar Exercises: Standing   Other Standing Lumbar Exercises Isometric shoulder press for core strengthening x10 reps     Lumbar Exercises: Supine   Bridge 20 reps   Straight Leg Raise 15 reps   Straight Leg Raises Limitations BLE; RLE pain to R knee     Shoulder Exercises: Supine   External Rotation AAROM;Right;20 reps   Flexion AAROM;Both;20 reps     Shoulder Exercises: Pulleys   Flexion 3 minutes   Other Pulley Exercises UE ranger into flexion x20 reps     Modalities   Modalities Electrical Stimulation;Moist Heat     Moist Heat Therapy   Number Minutes Moist Heat 15 Minutes   Moist Heat Location Shoulder;Lumbar Spine     Electrical Stimulation   Electrical Stimulation Location R posterior shoulder/ R QL and SI joint    Electrical Stimulation Action Pre-Mod   Electrical Stimulation Parameters 80-150 hz x15 min   Electrical Stimulation Goals Pain                  PT Short Term Goals - 11/21/16 1349      PT SHORT TERM GOAL #1   Title STG's=LTG's.           PT Long Term Goals - 11/21/16 1405      PT LONG TERM GOAL #7   Title Sleep undisturbed 6 hours.   Time 8   Period Weeks   Status New               Plan - 11/25/16 0929    Clinical Impression Statement Patient presented in clinic with continued R shoulder pain and R low back pain. Patient presents with R rib hump and balance instabilities as patient required supervision with standing UE ranger due to balance. Patient experienced intemittant popping of R shoulder with NuStep UE involvement. Balance deficits also noted with standing shoulder isometric for core strengthening with posterior lean noted. Radiation of pain to R knee reported by patient with R SLR. Normal modalities response noted following removal of the modalities. Patient educated at end of treatment to report of post treatment effects to allow for appropriate treatment planning.   PT Frequency 2x / week   PT Duration 8 weeks   PT Treatment/Interventions ADLs/Self Care Home Management;Cryotherapy;Electrical Stimulation;Ultrasound;Moist Heat;Functional mobility training;Therapeutic activities;Therapeutic exercise;Neuromuscular re-education;Patient/family education;Passive range of motion;Vasopneumatic Device;Manual techniques   PT Next Visit Plan Continue core/lumbar strengthening as well as shoulder ROM/strengthening with modalities PRN for pain per MPT POC.   Consulted and Agree with Plan of Care Patient      Patient will benefit from skilled therapeutic intervention in order to improve the following deficits and impairments:     Visit Diagnosis: Chronic right shoulder pain  Chronic right-sided low back pain without sciatica  Abnormal posture  Muscle  weakness (generalized)     Problem List Patient Active Problem List   Diagnosis Date Noted  . HTN (hypertension) 02/19/2016  . Post-nasal drip 05/15/2015  . Postoperative abdominal pain 12/07/2014  . Voiding dysfunction 10/07/2014  . SUI (stress urinary incontinence, female) 10/07/2014  . Osteoporosis with fracture 04/06/2014  . Impacted cerumen of both ears 08/20/2013  . Encounter for therapeutic drug monitoring 08/20/2013  . DOE (dyspnea on exertion) 07/23/2013  . Helicobacter positive gastritis 06/21/2013  .  Orthostatic hypotension 06/10/2013  . UTI (lower urinary tract infection) 05/06/2013  . Neck mass 02/19/2013  . Neck pain, bilateral 02/19/2013  . Right groin mass 11/08/2011  . Low back pain 11/08/2011  . Lipoma of neck 06/21/2011  . Memory loss 06/21/2011  . OVERACTIVE BLADDER 08/21/2010  . Long term current use of anticoagulant 07/25/2010  . Osteoarthritis 01/18/2010  . KNEE PAIN 01/18/2010  . FOOT PAIN 01/18/2010  . Hyperlipidemia with target LDL less than 100 04/20/2009  . GERD 04/17/2009  . MITRAL REGURGITATION, MILD 08/30/2008  . NICM (nonischemic cardiomyopathy) ([place]) 08/30/2008  . Congestive heart failure ([place]) 04/13/2007  . DEPRESSION 04/10/2007  . DISEASE, MITRAL VALVE NEC/NOS 04/10/2007  . Atrial fibrillation ([place]) 04/10/2007  . STASIS ULCER 04/10/2007  . VENOUS INSUFFICIENCY, LEGS 04/10/2007    Wynelle Fanny, PTA 11/25/2016, 9:44 AM  [facility] [place], [place], [address] Phone: [phone]   Fax:  [phone]  Name: Virginia Young MRN: [id number] Date of Birth: Nov 16, 1940

## 2016-11-25 NOTE — Telephone Encounter (Signed)
REFILL 

## 2016-11-27 ENCOUNTER — Encounter: Payer: Self-pay | Admitting: Physical Therapy

## 2016-11-27 ENCOUNTER — Ambulatory Visit: Payer: Medicare Other | Admitting: Physical Therapy

## 2016-11-27 DIAGNOSIS — M25511 Pain in right shoulder: Secondary | ICD-10-CM

## 2016-11-27 DIAGNOSIS — G8929 Other chronic pain: Secondary | ICD-10-CM

## 2016-11-27 DIAGNOSIS — M545 Low back pain: Principal | ICD-10-CM

## 2016-11-27 DIAGNOSIS — M6281 Muscle weakness (generalized): Secondary | ICD-10-CM

## 2016-11-27 DIAGNOSIS — R293 Abnormal posture: Secondary | ICD-10-CM

## 2016-11-27 NOTE — Therapy (Signed)
[facility] [place], [place], [address] Phone: [phone]   Fax:  [phone]  Physical Therapy Treatment  Patient Details  Name: Virginia Young MRN: [id number] Date of Birth: Dec 09, 1940 Referring Provider: Evelina Dun  Encounter Date: 11/27/2016      PT End of Session - 11/27/16 0929    Visit Number 3   Number of Visits 16   Date for PT Re-Evaluation 01/20/17   PT Start Time 0901   PT Stop Time 0935   PT Time Calculation (min) 34 min   Activity Tolerance Treatment limited secondary to medical complications (Comment)   Behavior During Therapy [facility] for tasks assessed/performed      Past Medical History:  Diagnosis Date  . Allergy   . Atrial fibrillation (HCC)    a. coumadin;  b. Amiodarone  . Cataract   . Chest pain 03/2012   a. Lex MV 10/13:  EF 64%, dist ant and apical defect sugg of soft tissue atten, no ischemia  . Chronic systolic heart failure ([place])   . Depression   . GERD (gastroesophageal reflux disease)   . HLD (hyperlipidemia)   . Incontinence of urine   . Mild mitral regurgitation   . MS (multiple sclerosis) ([place])   . NICM (nonischemic cardiomyopathy) ([place])    a. neg CLite in 2003;  b. EF 40-45% in past;   c.  Echo 12/11: EF 35-40%, mild MR, mild LAE, mild RAE, small pericardial effusion   . Osteoarthritis   . Osteoporosis   . Stasis ulcer ([place])   . Uterine prolapse   . Vaginal prolapse without uterine prolapse   . Varicose veins     Past Surgical History:  Procedure Laterality Date  . ANTERIOR AND POSTERIOR VAGINAL REPAIR W/ SACROSPINOUS LIGAMENT SUSPENSION  2016   [facility]  . BLADDER SURGERY    . cataract surgery Bilateral   . EYE SURGERY    . INGUINAL HERNIA REPAIR     right  . LIPOMA EXCISION     h/o removed from upper back x 2    There were no vitals filed for this visit.      Subjective Assessment - 11/27/16 0912    Subjective Patient arrived with some reported dizziness and  LOB, therapist was near and able to assist to avoid a fall, monitored BP pre tx 98/58, O2%97, HR 98, patient wanted to try light exercise   Pertinent History MS; OP.   Limitations Walking   How long can you stand comfortably? <10 minutes.   How long can you walk comfortably? Short distances.   Diagnostic tests NCV test to right UE.  X-rays   Patient Stated Goals Move better with less pain.   Currently in Pain? Yes   Pain Score 3    Pain Location Shoulder   Pain Orientation Right   Pain Descriptors / Indicators Discomfort   Pain Type Chronic pain   Pain Onset More than a month ago   Pain Frequency Intermittent   Aggravating Factors  movement   Pain Relieving Factors rest   Pain Score 5   Pain Location Back   Pain Orientation Right;Lower   Pain Descriptors / Indicators Aching   Pain Onset More than a month ago   Pain Frequency Constant   Aggravating Factors  prolong activity   Pain Relieving Factors rest  [facility] PT Treatment/Exercise - 11/27/16 0001      Lumbar Exercises: Aerobic   Stationary Bike nusep L5 x49min, 6 min, 19min     Moist Heat Therapy   Number Minutes Moist Heat 15 Minutes   Moist Heat Location Shoulder;Lumbar Spine     Electrical Stimulation   Electrical Stimulation Location R posterior shoulder/ R QL and SI joint   Electrical Stimulation Action premod   Electrical Stimulation Parameters 80-150hz  x93min   Electrical Stimulation Goals Pain                  PT Short Term Goals - 11/21/16 1349      PT SHORT TERM GOAL #1   Title STG's=LTG's.           PT Long Term Goals - 11/27/16 5102      PT LONG TERM GOAL #1   Title Independent with a HEP.   Time 8   Period Weeks   Status On-going     PT LONG TERM GOAL #2   Title Active right shoulder flexion to 145 degrees so the patient can easily reach overhead   Time 8   Period Weeks   Status On-going     PT LONG TERM GOAL #3   Title Active ER to  80 degrees+ to allow for easily donning/doffing of apparel   Time 8   Period Weeks   Status On-going     PT LONG TERM GOAL #4   Title Increase right shoulder and right LE strength to a solid 4+/5 to increase stability for performance of functional activities   Time 8   Period Weeks   Status On-going     PT LONG TERM GOAL #5   Title Stand 20 minutes with pain not > 3/10.   Time 8   Period Weeks   Status On-going     PT LONG TERM GOAL #6   Title Walk a community distance with pain not > 3/10.   Time 8   Period Weeks     PT LONG TERM GOAL #7   Title Sleep undisturbed 6 hours.   Time 8   Period Weeks   Status On-going               Plan - 11/27/16 0931    Clinical Impression Statement Patient arrived very unbalanced today and reported feeling fatigue, some dizziness and almost blacked out early in the morning. Patient reported a hisory of atril fib and having an off day yet wanted to try some light exrecise. Monitored patients vitals throughout treatment and monitored patient durning treatment, rest breaks during. Patient unable to complete exercises today due to not feeling well and other medical issues. Today used gait belt to assit patient thoroghtout treatment and out to waiting room after treatment for safety. Patient refused to use her cane and carried her cane out and refused assistance out to car today. Educated patient on importance of assistive device to avoid falls. Goals ongoing.   PT Frequency 2x / week   PT Duration 8 weeks   PT Treatment/Interventions ADLs/Self Care Home Management;Cryotherapy;Electrical Stimulation;Ultrasound;Moist Heat;Functional mobility training;Therapeutic activities;Therapeutic exercise;Neuromuscular re-education;Patient/family education;Passive range of motion;Vasopneumatic Device;Manual techniques   PT Next Visit Plan Continue core/lumbar strengthening as well as shoulder ROM/strengthening with modalities PRN for pain per MPT POC. monitor  vitals   Consulted and Agree with Plan of Care Patient      Patient will benefit from skilled therapeutic intervention in order to improve the  following deficits and impairments:     Visit Diagnosis: Chronic right-sided low back pain without sciatica  Chronic right shoulder pain  Abnormal posture  Muscle weakness (generalized)     Problem List Patient Active Problem List   Diagnosis Date Noted  . HTN (hypertension) 02/19/2016  . Post-nasal drip 05/15/2015  . Postoperative abdominal pain 12/07/2014  . Voiding dysfunction 10/07/2014  . SUI (stress urinary incontinence, female) 10/07/2014  . Osteoporosis with fracture 04/06/2014  . Impacted cerumen of both ears 08/20/2013  . Encounter for therapeutic drug monitoring 08/20/2013  . DOE (dyspnea on exertion) 07/23/2013  . Helicobacter positive gastritis 06/21/2013  . Orthostatic hypotension 06/10/2013  . UTI (lower urinary tract infection) 05/06/2013  . Neck mass 02/19/2013  . Neck pain, bilateral 02/19/2013  . Right groin mass 11/08/2011  . Low back pain 11/08/2011  . Lipoma of neck 06/21/2011  . Memory loss 06/21/2011  . OVERACTIVE BLADDER 08/21/2010  . Long term current use of anticoagulant 07/25/2010  . Osteoarthritis 01/18/2010  . KNEE PAIN 01/18/2010  . FOOT PAIN 01/18/2010  . Hyperlipidemia with target LDL less than 100 04/20/2009  . GERD 04/17/2009  . MITRAL REGURGITATION, MILD 08/30/2008  . NICM (nonischemic cardiomyopathy) ([place]) 08/30/2008  . Congestive heart failure ([place]) 04/13/2007  . DEPRESSION 04/10/2007  . DISEASE, MITRAL VALVE NEC/NOS 04/10/2007  . Atrial fibrillation ([place]) 04/10/2007  . STASIS ULCER 04/10/2007  . VENOUS INSUFFICIENCY, LEGS 04/10/2007    Saina Waage P, PTA 11/27/2016, 9:53 AM  [facility] [address] [place], [place], [address] Phone: [phone]   Fax:  [phone]  Name: Virginia Young MRN: [id number] Date of Birth:  07-07-1940

## 2016-11-29 ENCOUNTER — Encounter: Payer: Self-pay | Admitting: Physician Assistant

## 2016-11-29 ENCOUNTER — Ambulatory Visit (INDEPENDENT_AMBULATORY_CARE_PROVIDER_SITE_OTHER): Payer: Medicare Other | Admitting: Physician Assistant

## 2016-11-29 ENCOUNTER — Telehealth: Payer: Self-pay | Admitting: Pharmacist

## 2016-11-29 ENCOUNTER — Ambulatory Visit (INDEPENDENT_AMBULATORY_CARE_PROVIDER_SITE_OTHER): Payer: Medicare Other | Admitting: Pharmacist

## 2016-11-29 VITALS — BP 105/70 | HR 66 | Temp 97.6°F | Ht 70.0 in | Wt 183.0 lb

## 2016-11-29 DIAGNOSIS — I4819 Other persistent atrial fibrillation: Secondary | ICD-10-CM

## 2016-11-29 DIAGNOSIS — I481 Persistent atrial fibrillation: Secondary | ICD-10-CM

## 2016-11-29 DIAGNOSIS — R300 Dysuria: Secondary | ICD-10-CM | POA: Diagnosis not present

## 2016-11-29 DIAGNOSIS — Z5181 Encounter for therapeutic drug level monitoring: Secondary | ICD-10-CM

## 2016-11-29 LAB — COAGUCHEK XS/INR WAIVED
INR: 1.9 — ABNORMAL HIGH (ref 0.9–1.1)
Prothrombin Time: 23 s

## 2016-11-29 LAB — MICROSCOPIC EXAMINATION
Epithelial Cells (non renal): >10 /hpf — AB (ref 0–10)
WBC, UA: >30 /hpf — AB (ref 0–?)

## 2016-11-29 LAB — URINALYSIS, COMPLETE
Bilirubin, UA: NEGATIVE
Glucose, UA: NEGATIVE
Ketones, UA: NEGATIVE
Nitrite, UA: NEGATIVE
Specific Gravity, UA: 1.01 (ref 1.005–1.030)
Urobilinogen, Ur: 1 mg/dL (ref 0.2–1.0)
pH, UA: 7 (ref 5.0–7.5)

## 2016-11-29 MED ORDER — NITROFURANTOIN MONOHYD MACRO 100 MG PO CAPS: 100.0000 mg | ORAL_CAPSULE | Freq: Two times a day (BID) | ORAL | 0 refills | Status: DC

## 2016-11-29 NOTE — Patient Instructions (Signed)
Anticoagulation Warfarin Dose Instructions as of 11/29/2016      Virginia Young Tue Wed Thu Fri Sat   New Dose 2.5 mg 5 mg 2.5 mg 5 mg 2.5 mg 5 mg 2.5 mg    Description   Continue current warfarin 5mg  - take 1 tablet mondays, wednesdays and fridays.  Take 1/2 tablet all other days. Cut back on eating greens a little.  INR was 1.9 today (goal is 2.0 to 3.0)

## 2016-11-29 NOTE — Patient Instructions (Signed)

## 2016-11-29 NOTE — Progress Notes (Signed)
Subjective:     Patient ID: Virginia Young, female   DOB: [birth year], 76 y.o.   MRN: [id number]  HPI Pt with lower abd pain, fatigue and dysuria for several days No OTC meds for sx  Review of Systems  Constitutional: Positive for activity change, appetite change and fatigue. Negative for chills and fever.  HENT: Negative.   Respiratory: Negative.   Cardiovascular: Negative.   Gastrointestinal: Positive for abdominal pain. Negative for nausea and vomiting.  Genitourinary: Positive for dysuria and frequency.       Objective:   Physical Exam  Constitutional: She appears well-developed and well-nourished. No distress.  HENT:  Mouth/Throat: Oropharynx is clear and moist. No oropharyngeal exudate.  Abdominal: Soft. She exhibits no distension and no mass. There is tenderness. There is no rebound and no guarding.  + suprapubic TTP No CVAT  Lymphadenopathy:    She has no cervical adenopathy.  Nursing note and vitals reviewed. UA- many bacteria, +blood with 3+ leuk       Assessment:     Dysuria    Plan:     Macrobid bid x 1 week Hydrate well  Fluids Pt currently on Coumadin so f/u next wk for INR

## 2016-11-29 NOTE — Telephone Encounter (Signed)
Made apt to see pt for overdue INR today

## 2016-12-02 ENCOUNTER — Ambulatory Visit: Payer: Medicare Other | Admitting: Physical Therapy

## 2016-12-02 ENCOUNTER — Encounter: Payer: Self-pay | Admitting: Physical Therapy

## 2016-12-02 VITALS — BP 120/84 | HR 85

## 2016-12-02 DIAGNOSIS — M6281 Muscle weakness (generalized): Secondary | ICD-10-CM

## 2016-12-02 DIAGNOSIS — M545 Low back pain: Secondary | ICD-10-CM | POA: Diagnosis not present

## 2016-12-02 DIAGNOSIS — G8929 Other chronic pain: Secondary | ICD-10-CM

## 2016-12-02 DIAGNOSIS — R293 Abnormal posture: Secondary | ICD-10-CM

## 2016-12-02 DIAGNOSIS — M25511 Pain in right shoulder: Secondary | ICD-10-CM | POA: Diagnosis not present

## 2016-12-02 NOTE — Therapy (Signed)
[facility] [place], [place], [address] Phone: [phone]   Fax:  [phone]  Physical Therapy Treatment  Patient Details  Name: Virginia Young MRN: [id number] Date of Birth: May 19, 1941 Referring Provider: Evelina Dun  Encounter Date: 12/02/2016      PT End of Session - 12/02/16 0857    Visit Number 4   Number of Visits 16   Date for PT Re-Evaluation 01/20/17   PT Start Time 0906   PT Stop Time 0954   PT Time Calculation (min) 48 min   Behavior During Therapy [facility] for tasks assessed/performed      Past Medical History:  Diagnosis Date  . Allergy   . Atrial fibrillation (HCC)    a. coumadin;  b. Amiodarone  . Cataract   . Chest pain 03/2012   a. Lex MV 10/13:  EF 64%, dist ant and apical defect sugg of soft tissue atten, no ischemia  . Chronic systolic heart failure ([place])   . Depression   . GERD (gastroesophageal reflux disease)   . HLD (hyperlipidemia)   . Incontinence of urine   . Mild mitral regurgitation   . MS (multiple sclerosis) ([place])   . NICM (nonischemic cardiomyopathy) ([place])    a. neg CLite in 2003;  b. EF 40-45% in past;   c.  Echo 12/11: EF 35-40%, mild MR, mild LAE, mild RAE, small pericardial effusion   . Osteoarthritis   . Osteoporosis   . Stasis ulcer ([place])   . Uterine prolapse   . Vaginal prolapse without uterine prolapse   . Varicose veins     Past Surgical History:  Procedure Laterality Date  . ANTERIOR AND POSTERIOR VAGINAL REPAIR W/ SACROSPINOUS LIGAMENT SUSPENSION  2016   [facility]  . BLADDER SURGERY    . cataract surgery Bilateral   . EYE SURGERY    . INGUINAL HERNIA REPAIR     right  . LIPOMA EXCISION     h/o removed from upper back x 2    Vitals:   12/02/16 0910  BP: 120/84  Pulse: 85  SpO2: 99%        Subjective Assessment - 12/02/16 0857    Subjective Reports that she is not feeling any better today. Reports that her low back and shoulder are still hurting.  Reports that her R shoulder hurts and gets to where she cannot stand it to use her cane.   Pertinent History MS; OP.   Limitations Walking   How long can you stand comfortably? <10 minutes.   How long can you walk comfortably? Short distances.   Diagnostic tests NCV test to right UE.  X-rays   Patient Stated Goals Move better with less pain.   Currently in Pain? Yes   Pain Score 5    Pain Location Back   Pain Orientation Lower   Pain Descriptors / Indicators Discomfort   Pain Type Chronic pain   Pain Radiating Towards to R hip   Pain Onset More than a month ago   Pain Score 6   Pain Location Shoulder   Pain Orientation Right   Pain Descriptors / Indicators Discomfort   Pain Type Chronic pain   Pain Onset More than a month ago            [facility] PT Assessment - 12/02/16 0001      Assessment   Medical Diagnosis OA involving multiple joints.     Restrictions   Weight Bearing Restrictions No  [place] Adult PT Treatment/Exercise - 12/02/16 0001      Lumbar Exercises: Aerobic   Stationary Bike NuStep L4 x10 min     Lumbar Exercises: Standing   Other Standing Lumbar Exercises Isometric shoulder press for core strengthening x15 reps with 3 sec hold     Lumbar Exercises: Supine   Bridge 10 reps   Bridge Limitations Stopped to avoid pelvic pain   Straight Leg Raise 10 reps   Straight Leg Raises Limitations BLE; more pain with RLE     Shoulder Exercises: Pulleys   Flexion 3 minutes   Other Pulley Exercises UE ranger into flexion,circles x20 reps in sitting     Shoulder Exercises: Isometric Strengthening   Flexion Other (comment)  10 reps x5 sec hold   Extension Other (comment)  10 reps x5 sec hold   External Rotation Other (comment)  10 reps x5 sec hold   Internal Rotation Other (comment)  10 reps x5 sec hold     Modalities   Modalities Electrical Stimulation;Moist Heat     Moist Heat Therapy   Number Minutes Moist Heat 15 Minutes    Moist Heat Location Shoulder;Lumbar Spine     Electrical Stimulation   Electrical Stimulation Location R shoulder; R low back   Electrical Stimulation Action Pre-mod   Electrical Stimulation Parameters 80-150 hz x15 min   Electrical Stimulation Goals Pain                  PT Short Term Goals - 11/21/16 1349      PT SHORT TERM GOAL #1   Title STG's=LTG's.           PT Long Term Goals - 11/27/16 1610      PT LONG TERM GOAL #1   Title Independent with a HEP.   Time 8   Period Weeks   Status On-going     PT LONG TERM GOAL #2   Title Active right shoulder flexion to 145 degrees so the patient can easily reach overhead   Time 8   Period Weeks   Status On-going     PT LONG TERM GOAL #3   Title Active ER to 80 degrees+ to allow for easily donning/doffing of apparel   Time 8   Period Weeks   Status On-going     PT LONG TERM GOAL #4   Title Increase right shoulder and right LE strength to a solid 4+/5 to increase stability for performance of functional activities   Time 8   Period Weeks   Status On-going     PT LONG TERM GOAL #5   Title Stand 20 minutes with pain not > 3/10.   Time 8   Period Weeks   Status On-going     PT LONG TERM GOAL #6   Title Walk a community distance with pain not > 3/10.   Time 8   Period Weeks     PT LONG TERM GOAL #7   Title Sleep undisturbed 6 hours.   Time 8   Period Weeks   Status On-going               Plan - 12/02/16 0942    Clinical Impression Statement Patient arrived to clinic with no improvement in her overall pain or in general. R shoulder pain limits her with use of AD in R hand per patient report and patient encouraged during ambulation in clinic to use AD which she used very rarely secondary to reported R shoulder  pain. Patient provided demo for R shoulder isometrics and shoulder isometric with core activation. Patient limited with supine exercises as to prevent pelvic pain per patient report. Normal  modalities response noted following removal of the modalities. Patient denied assist to her car today. Vitals taken following treatment were 102/66, 99%, 78 bpm.   PT Frequency 2x / week   PT Duration 8 weeks   PT Treatment/Interventions ADLs/Self Care Home Management;Cryotherapy;Electrical Stimulation;Ultrasound;Moist Heat;Functional mobility training;Therapeutic activities;Therapeutic exercise;Neuromuscular re-education;Patient/family education;Passive range of motion;Vasopneumatic Device;Manual techniques   PT Next Visit Plan Continue core/lumbar strengthening as well as shoulder ROM/strengthening with modalities PRN for pain per MPT POC. monitor vitals   Consulted and Agree with Plan of Care Patient      Patient will benefit from skilled therapeutic intervention in order to improve the following deficits and impairments:     Visit Diagnosis: Chronic right-sided low back pain without sciatica  Chronic right shoulder pain  Abnormal posture  Muscle weakness (generalized)     Problem List Patient Active Problem List   Diagnosis Date Noted  . HTN (hypertension) 02/19/2016  . Post-nasal drip 05/15/2015  . Postoperative abdominal pain 12/07/2014  . Voiding dysfunction 10/07/2014  . SUI (stress urinary incontinence, female) 10/07/2014  . Osteoporosis with fracture 04/06/2014  . Impacted cerumen of both ears 08/20/2013  . Encounter for therapeutic drug monitoring 08/20/2013  . DOE (dyspnea on exertion) 07/23/2013  . Helicobacter positive gastritis 06/21/2013  . Orthostatic hypotension 06/10/2013  . UTI (lower urinary tract infection) 05/06/2013  . Neck mass 02/19/2013  . Neck pain, bilateral 02/19/2013  . Right groin mass 11/08/2011  . Low back pain 11/08/2011  . Lipoma of neck 06/21/2011  . Memory loss 06/21/2011  . OVERACTIVE BLADDER 08/21/2010  . Long term current use of anticoagulant 07/25/2010  . Osteoarthritis 01/18/2010  . KNEE PAIN 01/18/2010  . FOOT PAIN 01/18/2010   . Hyperlipidemia with target LDL less than 100 04/20/2009  . GERD 04/17/2009  . MITRAL REGURGITATION, MILD 08/30/2008  . NICM (nonischemic cardiomyopathy) ([place]) 08/30/2008  . Congestive heart failure ([place]) 04/13/2007  . DEPRESSION 04/10/2007  . DISEASE, MITRAL VALVE NEC/NOS 04/10/2007  . Atrial fibrillation ([place]) 04/10/2007  . STASIS ULCER 04/10/2007  . VENOUS INSUFFICIENCY, LEGS 04/10/2007    Wynelle Fanny, PTA 12/02/2016, 10:01 AM  [facility] [address] [place], [place], [address] Phone: [phone]   Fax:  [phone]  Name: Virginia Young MRN: [id number] Date of Birth: Sep 06, 1940

## 2016-12-04 ENCOUNTER — Ambulatory Visit: Payer: Medicare Other | Admitting: Physical Therapy

## 2016-12-04 ENCOUNTER — Encounter: Payer: Self-pay | Admitting: Physical Therapy

## 2016-12-04 DIAGNOSIS — M25511 Pain in right shoulder: Secondary | ICD-10-CM | POA: Diagnosis not present

## 2016-12-04 DIAGNOSIS — M545 Low back pain: Principal | ICD-10-CM

## 2016-12-04 DIAGNOSIS — R293 Abnormal posture: Secondary | ICD-10-CM | POA: Diagnosis not present

## 2016-12-04 DIAGNOSIS — G8929 Other chronic pain: Secondary | ICD-10-CM | POA: Diagnosis not present

## 2016-12-04 DIAGNOSIS — M6281 Muscle weakness (generalized): Secondary | ICD-10-CM

## 2016-12-04 NOTE — Therapy (Signed)
[facility] [place], [place], [address] Phone: [phone]   Fax:  [phone]  Physical Therapy Treatment  Patient Details  Name: Virginia Young MRN: [id number] Date of Birth: [birth year] Referring Provider: Evelina Dun  Encounter Date: 12/04/2016      PT End of Session - 12/04/16 1102    Visit Number 5   Number of Visits 16   Date for PT Re-Evaluation 01/20/17   PT Start Time 1031   PT Stop Time 1116   PT Time Calculation (min) 45 min   Activity Tolerance Patient tolerated treatment well   Behavior During Therapy [facility] for tasks assessed/performed      Past Medical History:  Diagnosis Date  . Allergy   . Atrial fibrillation (HCC)    a. coumadin;  b. Amiodarone  . Cataract   . Chest pain 03/2012   a. Lex MV 10/13:  EF 64%, dist ant and apical defect sugg of soft tissue atten, no ischemia  . Chronic systolic heart failure ([place])   . Depression   . GERD (gastroesophageal reflux disease)   . HLD (hyperlipidemia)   . Incontinence of urine   . Mild mitral regurgitation   . MS (multiple sclerosis) ([place])   . NICM (nonischemic cardiomyopathy) ([place])    a. neg CLite in 2003;  b. EF 40-45% in past;   c.  Echo 12/11: EF 35-40%, mild MR, mild LAE, mild RAE, small pericardial effusion   . Osteoarthritis   . Osteoporosis   . Stasis ulcer ([place])   . Uterine prolapse   . Vaginal prolapse without uterine prolapse   . Varicose veins     Past Surgical History:  Procedure Laterality Date  . ANTERIOR AND POSTERIOR VAGINAL REPAIR W/ SACROSPINOUS LIGAMENT SUSPENSION  2016   [facility]  . BLADDER SURGERY    . cataract surgery Bilateral   . EYE SURGERY    . INGUINAL HERNIA REPAIR     right  . LIPOMA EXCISION     h/o removed from upper back x 2    There were no vitals filed for this visit.      Subjective Assessment - 12/04/16 1037    Subjective Patient reported some fatigue today, did ok after last treatment   Pertinent  History MS; OP.   Limitations Walking   How long can you stand comfortably? <10 minutes.   How long can you walk comfortably? Short distances.   Diagnostic tests NCV test to right UE.  X-rays   Patient Stated Goals Move better with less pain.   Currently in Pain? Yes   Pain Score 5    Pain Location Back   Pain Orientation Lower   Pain Descriptors / Indicators Discomfort   Pain Type Chronic pain   Pain Onset More than a month ago   Pain Frequency Intermittent   Aggravating Factors  movement   Pain Relieving Factors at rest            [facility] PT Assessment - 12/04/16 0001      ROM / Strength   AROM / PROM / Strength AROM     AROM   AROM Assessment Site Shoulder   Right/Left Shoulder Right   Right Shoulder Flexion 114 Degrees  limited due to weakness and soreness                     [facility] Adult PT Treatment/Exercise - 12/04/16 0001      Lumbar Exercises: Aerobic  Stationary Bike NuStep L4 x10 min UE/LE activity, monitored vitals     Shoulder Exercises: Seated   Retraction Strengthening;Right;10 reps;Theraband   Theraband Level (Shoulder Retraction) Level 1 (Yellow)   Row Strengthening;Right;10 reps;Theraband   Theraband Level (Shoulder Row) Level 1 (Yellow)   External Rotation Strengthening;Right;Theraband;10 reps   Theraband Level (Shoulder External Rotation) Level 1 (Yellow)   Internal Rotation Strengthening;Right;10 reps;Theraband   Theraband Level (Shoulder Internal Rotation) Level 1 (Yellow)   Other Seated Exercises cane for flexion, chest press and ER 2x10 each     Shoulder Exercises: Pulleys   Flexion 3 minutes   Other Pulley Exercises UE ranger into flexion,circles x20 reps in sitting     Moist Heat Therapy   Number Minutes Moist Heat 15 Minutes   Moist Heat Location Shoulder;Lumbar Spine     Electrical Stimulation   Electrical Stimulation Location R shoulder; R low back   Electrical Stimulation Action premod   Electrical Stimulation  Parameters 80-150hx x29min   Electrical Stimulation Goals Pain                  PT Short Term Goals - 11/21/16 1349      PT SHORT TERM GOAL #1   Title STG's=LTG's.           PT Long Term Goals - 11/27/16 7858      PT LONG TERM GOAL #1   Title Independent with a HEP.   Time 8   Period Weeks   Status On-going     PT LONG TERM GOAL #2   Title Active right shoulder flexion to 145 degrees so the patient can easily reach overhead   Time 8   Period Weeks   Status On-going     PT LONG TERM GOAL #3   Title Active ER to 80 degrees+ to allow for easily donning/doffing of apparel   Time 8   Period Weeks   Status On-going     PT LONG TERM GOAL #4   Title Increase right shoulder and right LE strength to a solid 4+/5 to increase stability for performance of functional activities   Time 8   Period Weeks   Status On-going     PT LONG TERM GOAL #5   Title Stand 20 minutes with pain not > 3/10.   Time 8   Period Weeks   Status On-going     PT LONG TERM GOAL #6   Title Walk a community distance with pain not > 3/10.   Time 8   Period Weeks     PT LONG TERM GOAL #7   Title Sleep undisturbed 6 hours.   Time 8   Period Weeks   Status On-going               Plan - 12/04/16 1103    Clinical Impression Statement Patient tolerated treatment well today with vitals WNL and able to progress exercises. Patient reported ongoing right shoulder and upper back discomfort. Patient was educated on use of assist device for safety. Patient current goals ongoing pain, strength and ROM defcits.   PT Frequency 2x / week   PT Duration 8 weeks   PT Treatment/Interventions ADLs/Self Care Home Management;Cryotherapy;Electrical Stimulation;Ultrasound;Moist Heat;Functional mobility training;Therapeutic activities;Therapeutic exercise;Neuromuscular re-education;Patient/family education;Passive range of motion;Vasopneumatic Device;Manual techniques   PT Next Visit Plan Continue  core/lumbar strengthening as well as shoulder ROM/strengthening with modalities PRN for pain per MPT POC. monitor vitals   Consulted and Agree with Plan of Care Patient  Patient will benefit from skilled therapeutic intervention in order to improve the following deficits and impairments:     Visit Diagnosis: Chronic right-sided low back pain without sciatica  Chronic right shoulder pain  Abnormal posture  Muscle weakness (generalized)     Problem List Patient Active Problem List   Diagnosis Date Noted  . HTN (hypertension) 02/19/2016  . Post-nasal drip 05/15/2015  . Postoperative abdominal pain 12/07/2014  . Voiding dysfunction 10/07/2014  . SUI (stress urinary incontinence, female) 10/07/2014  . Osteoporosis with fracture 04/06/2014  . Impacted cerumen of both ears 08/20/2013  . Encounter for therapeutic drug monitoring 08/20/2013  . DOE (dyspnea on exertion) 07/23/2013  . Helicobacter positive gastritis 06/21/2013  . Orthostatic hypotension 06/10/2013  . UTI (lower urinary tract infection) 05/06/2013  . Neck mass 02/19/2013  . Neck pain, bilateral 02/19/2013  . Right groin mass 11/08/2011  . Low back pain 11/08/2011  . Lipoma of neck 06/21/2011  . Memory loss 06/21/2011  . OVERACTIVE BLADDER 08/21/2010  . Long term current use of anticoagulant 07/25/2010  . Osteoarthritis 01/18/2010  . KNEE PAIN 01/18/2010  . FOOT PAIN 01/18/2010  . Hyperlipidemia with target LDL less than 100 04/20/2009  . GERD 04/17/2009  . MITRAL REGURGITATION, MILD 08/30/2008  . NICM (nonischemic cardiomyopathy) ([place]) 08/30/2008  . Congestive heart failure ([place]) 04/13/2007  . DEPRESSION 04/10/2007  . DISEASE, MITRAL VALVE NEC/NOS 04/10/2007  . Atrial fibrillation ([place]) 04/10/2007  . STASIS ULCER 04/10/2007  . VENOUS INSUFFICIENCY, LEGS 04/10/2007    Gracielynn Birkel P, PTA 12/04/2016, 11:18 AM  [facility] [place], [place], [address] Phone: [phone]   Fax:  [phone]  Name: Virginia Young MRN: [id number] Date of Birth: 09-08-1940

## 2016-12-05 ENCOUNTER — Encounter: Payer: Medicare Other | Admitting: Pharmacist

## 2016-12-05 DIAGNOSIS — L820 Inflamed seborrheic keratosis: Secondary | ICD-10-CM | POA: Diagnosis not present

## 2016-12-05 DIAGNOSIS — C44729 Squamous cell carcinoma of skin of left lower limb, including hip: Secondary | ICD-10-CM | POA: Diagnosis not present

## 2016-12-09 ENCOUNTER — Ambulatory Visit: Payer: Medicare Other | Admitting: *Deleted

## 2016-12-09 DIAGNOSIS — M6281 Muscle weakness (generalized): Secondary | ICD-10-CM

## 2016-12-09 DIAGNOSIS — M545 Low back pain, unspecified: Secondary | ICD-10-CM

## 2016-12-09 DIAGNOSIS — M25511 Pain in right shoulder: Secondary | ICD-10-CM

## 2016-12-09 DIAGNOSIS — G8929 Other chronic pain: Secondary | ICD-10-CM | POA: Diagnosis not present

## 2016-12-09 DIAGNOSIS — R293 Abnormal posture: Secondary | ICD-10-CM | POA: Diagnosis not present

## 2016-12-09 NOTE — Therapy (Signed)
[facility] [place], [place], [address] Phone: [phone]   Fax:  [phone]  Physical Therapy Treatment  Patient Details  Name: Virginia Young MRN: [id number] Date of Birth: 30-Jul-1940 Referring Provider: Evelina Dun  Encounter Date: 12/09/2016      PT End of Session - 12/09/16 0912    Visit Number 6   Number of Visits 16   Date for PT Re-Evaluation 01/20/17   PT Start Time 0900   PT Stop Time 0952   PT Time Calculation (min) 52 min      Past Medical History:  Diagnosis Date  . Allergy   . Atrial fibrillation (HCC)    a. coumadin;  b. Amiodarone  . Cataract   . Chest pain 03/2012   a. Lex MV 10/13:  EF 64%, dist ant and apical defect sugg of soft tissue atten, no ischemia  . Chronic systolic heart failure ([place])   . Depression   . GERD (gastroesophageal reflux disease)   . HLD (hyperlipidemia)   . Incontinence of urine   . Mild mitral regurgitation   . MS (multiple sclerosis) ([place])   . NICM (nonischemic cardiomyopathy) ([place])    a. neg CLite in 2003;  b. EF 40-45% in past;   c.  Echo 12/11: EF 35-40%, mild MR, mild LAE, mild RAE, small pericardial effusion   . Osteoarthritis   . Osteoporosis   . Stasis ulcer ([place])   . Uterine prolapse   . Vaginal prolapse without uterine prolapse   . Varicose veins     Past Surgical History:  Procedure Laterality Date  . ANTERIOR AND POSTERIOR VAGINAL REPAIR W/ SACROSPINOUS LIGAMENT SUSPENSION  2016   [facility]  . BLADDER SURGERY    . cataract surgery Bilateral   . EYE SURGERY    . INGUINAL HERNIA REPAIR     right  . LIPOMA EXCISION     h/o removed from upper back x 2    There were no vitals filed for this visit.      Subjective Assessment - 12/09/16 0910    Subjective Patient reported some fatigue today, did ok after last treatment. Doing a little better   Pertinent History MS; OP.   Limitations Walking   How long can you stand comfortably? <10 minutes.    How long can you walk comfortably? Short distances.   Diagnostic tests NCV test to right UE.  X-rays   Currently in Pain? Yes   Pain Score 5    Pain Location Back   Pain Orientation Lower   Pain Descriptors / Indicators Discomfort   Pain Type Chronic pain   Pain Onset More than a month ago   Pain Frequency Intermittent   Multiple Pain Sites Yes   Pain Score 5   Pain Location Shoulder   Pain Descriptors / Indicators Discomfort   Pain Type Chronic pain   Pain Onset More than a month ago   Pain Frequency Constant                         [facility] Adult PT Treatment/Exercise - 12/09/16 0001      Lumbar Exercises: Aerobic   Stationary Bike NuStep L4 x15 min UE/LE activity, monitored vitals     Shoulder Exercises: Seated   Retraction Strengthening;10 reps;Theraband;Both   Theraband Level (Shoulder Retraction) Level 1 (Yellow)   Row Strengthening;Right;10 reps;Theraband   Theraband Level (Shoulder Row) Level 1 (Yellow)   External Rotation Strengthening;Right;Theraband;10  reps   Theraband Level (Shoulder External Rotation) Level 1 (Yellow)   Internal Rotation Strengthening;Right;10 reps;Theraband   Theraband Level (Shoulder Internal Rotation) Level 1 (Yellow)     Shoulder Exercises: Pulleys   Flexion --  x 5 mins   Other Pulley Exercises UE ranger into flexion,circles x20 reps in sitting x 3 mins     Modalities   Modalities Electrical Stimulation;Moist Heat     Moist Heat Therapy   Number Minutes Moist Heat 15 Minutes   Moist Heat Location Shoulder;Lumbar Spine     Electrical Stimulation   Electrical Stimulation Location R shoulder; R low back Premod 80-150hz  x15 mins   Electrical Stimulation Action in sitting   Electrical Stimulation Goals Pain                  PT Short Term Goals - 11/21/16 1349      PT SHORT TERM GOAL #1   Title STG's=LTG's.           PT Long Term Goals - 11/27/16 9371      PT LONG TERM GOAL #1   Title Independent  with a HEP.   Time 8   Period Weeks   Status On-going     PT LONG TERM GOAL #2   Title Active right shoulder flexion to 145 degrees so the patient can easily reach overhead   Time 8   Period Weeks   Status On-going     PT LONG TERM GOAL #3   Title Active ER to 80 degrees+ to allow for easily donning/doffing of apparel   Time 8   Period Weeks   Status On-going     PT LONG TERM GOAL #4   Title Increase right shoulder and right LE strength to a solid 4+/5 to increase stability for performance of functional activities   Time 8   Period Weeks   Status On-going     PT LONG TERM GOAL #5   Title Stand 20 minutes with pain not > 3/10.   Time 8   Period Weeks   Status On-going     PT LONG TERM GOAL #6   Title Walk a community distance with pain not > 3/10.   Time 8   Period Weeks     PT LONG TERM GOAL #7   Title Sleep undisturbed 6 hours.   Time 8   Period Weeks   Status On-going               Plan - 12/09/16 1005    Clinical Impression Statement Pt arrived to clinic today doing fairly well. She rated her pain 5/10 with RT shldr and mid-back and rib pain. She was able to perform therex for her  shldr and back today, but needs verbal and tactile cues for technique still. She did well with modalities with normal responses.   Clinical Decision Making Moderate   PT Frequency 2x / week   PT Duration 8 weeks   PT Next Visit Plan Continue core/lumbar strengthening as well as shoulder ROM/strengthening with modalities PRN for pain per MPT POC. monitor vitals   Consulted and Agree with Plan of Care Patient      Patient will benefit from skilled therapeutic intervention in order to improve the following deficits and impairments:     Visit Diagnosis: Chronic right-sided low back pain without sciatica  Chronic right shoulder pain  Abnormal posture  Muscle weakness (generalized)     Problem List Patient Active Problem List  Diagnosis Date Noted  . HTN  (hypertension) 02/19/2016  . Post-nasal drip 05/15/2015  . Postoperative abdominal pain 12/07/2014  . Voiding dysfunction 10/07/2014  . SUI (stress urinary incontinence, female) 10/07/2014  . Osteoporosis with fracture 04/06/2014  . Impacted cerumen of both ears 08/20/2013  . Encounter for therapeutic drug monitoring 08/20/2013  . DOE (dyspnea on exertion) 07/23/2013  . Helicobacter positive gastritis 06/21/2013  . Orthostatic hypotension 06/10/2013  . UTI (lower urinary tract infection) 05/06/2013  . Neck mass 02/19/2013  . Neck pain, bilateral 02/19/2013  . Right groin mass 11/08/2011  . Low back pain 11/08/2011  . Lipoma of neck 06/21/2011  . Memory loss 06/21/2011  . OVERACTIVE BLADDER 08/21/2010  . Long term current use of anticoagulant 07/25/2010  . Osteoarthritis 01/18/2010  . KNEE PAIN 01/18/2010  . FOOT PAIN 01/18/2010  . Hyperlipidemia with target LDL less than 100 04/20/2009  . GERD 04/17/2009  . MITRAL REGURGITATION, MILD 08/30/2008  . NICM (nonischemic cardiomyopathy) ([place]) 08/30/2008  . Congestive heart failure ([place]) 04/13/2007  . DEPRESSION 04/10/2007  . DISEASE, MITRAL VALVE NEC/NOS 04/10/2007  . Atrial fibrillation ([place]) 04/10/2007  . STASIS ULCER 04/10/2007  . VENOUS INSUFFICIENCY, LEGS 04/10/2007    Keller Mikels,CHRIS, PTA 12/09/2016, 10:16 AM  [facility] [place], [place], [address] Phone: [phone]   Fax:  [phone]  Name: Virginia Young MRN: [id number] Date of Birth: 07/17/1940

## 2016-12-10 ENCOUNTER — Other Ambulatory Visit: Payer: Self-pay | Admitting: Family Medicine

## 2016-12-11 ENCOUNTER — Encounter: Payer: Self-pay | Admitting: Physical Therapy

## 2016-12-11 ENCOUNTER — Ambulatory Visit: Payer: Medicare Other | Admitting: Physical Therapy

## 2016-12-11 DIAGNOSIS — M25511 Pain in right shoulder: Secondary | ICD-10-CM | POA: Diagnosis not present

## 2016-12-11 DIAGNOSIS — M6281 Muscle weakness (generalized): Secondary | ICD-10-CM

## 2016-12-11 DIAGNOSIS — G8929 Other chronic pain: Secondary | ICD-10-CM

## 2016-12-11 DIAGNOSIS — R293 Abnormal posture: Secondary | ICD-10-CM | POA: Diagnosis not present

## 2016-12-11 DIAGNOSIS — M545 Low back pain: Principal | ICD-10-CM

## 2016-12-11 NOTE — Therapy (Signed)
[facility] [place], [place], [address] Phone: [phone]   Fax:  [phone]  Physical Therapy Treatment  Patient Details  Name: Virginia Young MRN: [id number] Date of Birth: 1940-07-04 Referring Provider: Evelina Dun  Encounter Date: 12/11/2016      PT End of Session - 12/11/16 0918    Visit Number 7   Number of Visits 16   Date for PT Re-Evaluation 01/20/17   PT Start Time 0071   PT Stop Time 0955   PT Time Calculation (min) 58 min   Activity Tolerance Patient tolerated treatment well   Behavior During Therapy [facility] for tasks assessed/performed      Past Medical History:  Diagnosis Date  . Allergy   . Atrial fibrillation (HCC)    a. coumadin;  b. Amiodarone  . Cataract   . Chest pain 03/2012   a. Lex MV 10/13:  EF 64%, dist ant and apical defect sugg of soft tissue atten, no ischemia  . Chronic systolic heart failure ([place])   . Depression   . GERD (gastroesophageal reflux disease)   . HLD (hyperlipidemia)   . Incontinence of urine   . Mild mitral regurgitation   . MS (multiple sclerosis) ([place])   . NICM (nonischemic cardiomyopathy) ([place])    a. neg CLite in 2003;  b. EF 40-45% in past;   c.  Echo 12/11: EF 35-40%, mild MR, mild LAE, mild RAE, small pericardial effusion   . Osteoarthritis   . Osteoporosis   . Stasis ulcer ([place])   . Uterine prolapse   . Vaginal prolapse without uterine prolapse   . Varicose veins     Past Surgical History:  Procedure Laterality Date  . ANTERIOR AND POSTERIOR VAGINAL REPAIR W/ SACROSPINOUS LIGAMENT SUSPENSION  2016   [facility]  . BLADDER SURGERY    . cataract surgery Bilateral   . EYE SURGERY    . INGUINAL HERNIA REPAIR     right  . LIPOMA EXCISION     h/o removed from upper back x 2    There were no vitals filed for this visit.      Subjective Assessment - 12/11/16 0900    Subjective Patient feels some improvement with balance per reported   Pertinent History  MS; OP.   Limitations Walking   How long can you stand comfortably? <10 minutes.   How long can you walk comfortably? Short distances.   Diagnostic tests NCV test to right UE.  X-rays   Patient Stated Goals Move better with less pain.   Currently in Pain? Yes   Pain Score 5    Pain Location Back   Pain Orientation Lower   Pain Descriptors / Indicators Discomfort   Pain Type Chronic pain   Pain Onset More than a month ago   Pain Frequency Intermittent   Aggravating Factors  movement or increased activity   Pain Relieving Factors at rest   Multiple Pain Sites Yes   Pain Score 5   Pain Location Shoulder   Pain Orientation Right   Pain Descriptors / Indicators Discomfort   Pain Type Chronic pain   Pain Onset More than a month ago   Pain Frequency Constant   Aggravating Factors  prolong activity    Pain Relieving Factors at rest                         [facility] Adult PT Treatment/Exercise - 12/11/16 0001  Lumbar Exercises: Aerobic   Stationary Bike NuStep L4-5 x15 min UE/LE activity, monitored vitals     Lumbar Exercises: Standing   Heel Raises 10 reps;2 seconds   Heel Raises Limitations holding onto chair   Other Standing Lumbar Exercises standing marching 2x10 holding on to chair     Shoulder Exercises: Seated   Retraction Strengthening;Theraband;Both;15 reps   Theraband Level (Shoulder Retraction) Level 1 (Yellow)   Row Strengthening;Right;Theraband;15 reps   Theraband Level (Shoulder Row) Level 1 (Yellow)   External Rotation Strengthening;Right;Theraband;15 reps   Theraband Level (Shoulder External Rotation) Level 1 (Yellow)   Internal Rotation Strengthening;Right;Theraband;15 reps   Theraband Level (Shoulder Internal Rotation) Level 1 (Yellow)   Other Seated Exercises scap retractions with yellow t-band 2x10   Other Seated Exercises cane for flexion and chess press 2x10     Shoulder Exercises: Pulleys   Flexion Other (comment)  7min   Other  [place] Exercises UE ranger into flexion,circles x20 reps in sitting x 4 mins     Moist Heat Therapy   Number Minutes Moist Heat 15 Minutes   Moist Heat Location Shoulder;Lumbar Spine     Electrical Stimulation   Electrical Stimulation Location R shoulder; R low back Premod 80-150hz  x15 mins   Electrical Stimulation Action sitting   Electrical Stimulation Goals Pain                  PT Short Term Goals - 11/21/16 1349      PT SHORT TERM GOAL #1   Title STG's=LTG's.           PT Long Term Goals - 11/27/16 6269      PT LONG TERM GOAL #1   Title Independent with a HEP.   Time 8   Period Weeks   Status On-going     PT LONG TERM GOAL #2   Title Active right shoulder flexion to 145 degrees so the patient can easily reach overhead   Time 8   Period Weeks   Status On-going     PT LONG TERM GOAL #3   Title Active ER to 80 degrees+ to allow for easily donning/doffing of apparel   Time 8   Period Weeks   Status On-going     PT LONG TERM GOAL #4   Title Increase right shoulder and right LE strength to a solid 4+/5 to increase stability for performance of functional activities   Time 8   Period Weeks   Status On-going     PT LONG TERM GOAL #5   Title Stand 20 minutes with pain not > 3/10.   Time 8   Period Weeks   Status On-going     PT LONG TERM GOAL #6   Title Walk a community distance with pain not > 3/10.   Time 8   Period Weeks     PT LONG TERM GOAL #7   Title Sleep undisturbed 6 hours.   Time 8   Period Weeks   Status On-going               Plan - 12/11/16 0944    Clinical Impression Statement Patient tolerated treatment well today. Patient able to progress with exercise progression with no increased pain. Patient has ongoing weakness in right shoulder. PROM 145 degrees right shoulder flexion today. Patient able to tolerate minimal standing exercises today. Patient goals progressing yet ongoing due to strength and pain deficts.    PT  Frequency 2x / week   PT  Duration 8 weeks   PT Treatment/Interventions ADLs/Self Care Home Management;Cryotherapy;Electrical Stimulation;Ultrasound;Moist Heat;Functional mobility training;Therapeutic activities;Therapeutic exercise;Neuromuscular re-education;Patient/family education;Passive range of motion;Vasopneumatic Device;Manual techniques   PT Next Visit Plan Continue core/lumbar strengthening as well as shoulder ROM/strengthening with modalities PRN for pain per MPT POC. monitor vitals   Consulted and Agree with Plan of Care Patient      Patient will benefit from skilled therapeutic intervention in order to improve the following deficits and impairments:     Visit Diagnosis: Chronic right-sided low back pain without sciatica  Chronic right shoulder pain  Abnormal posture  Muscle weakness (generalized)     Problem List Patient Active Problem List   Diagnosis Date Noted  . HTN (hypertension) 02/19/2016  . Post-nasal drip 05/15/2015  . Postoperative abdominal pain 12/07/2014  . Voiding dysfunction 10/07/2014  . SUI (stress urinary incontinence, female) 10/07/2014  . Osteoporosis with fracture 04/06/2014  . Impacted cerumen of both ears 08/20/2013  . Encounter for therapeutic drug monitoring 08/20/2013  . DOE (dyspnea on exertion) 07/23/2013  . Helicobacter positive gastritis 06/21/2013  . Orthostatic hypotension 06/10/2013  . UTI (lower urinary tract infection) 05/06/2013  . Neck mass 02/19/2013  . Neck pain, bilateral 02/19/2013  . Right groin mass 11/08/2011  . Low back pain 11/08/2011  . Lipoma of neck 06/21/2011  . Memory loss 06/21/2011  . OVERACTIVE BLADDER 08/21/2010  . Long term current use of anticoagulant 07/25/2010  . Osteoarthritis 01/18/2010  . KNEE PAIN 01/18/2010  . FOOT PAIN 01/18/2010  . Hyperlipidemia with target LDL less than 100 04/20/2009  . GERD 04/17/2009  . MITRAL REGURGITATION, MILD 08/30/2008  . NICM (nonischemic cardiomyopathy) ([place])  08/30/2008  . Congestive heart failure ([place]) 04/13/2007  . DEPRESSION 04/10/2007  . DISEASE, MITRAL VALVE NEC/NOS 04/10/2007  . Atrial fibrillation ([place]) 04/10/2007  . STASIS ULCER 04/10/2007  . VENOUS INSUFFICIENCY, LEGS 04/10/2007    Page Lancon P, PTA 12/11/2016, 9:59 AM  [facility] [place], [place], [address] Phone: [phone]   Fax:  [phone]  Name: Virginia Young MRN: [id number] Date of Birth: Sep 14, 1940

## 2016-12-13 ENCOUNTER — Ambulatory Visit (INDEPENDENT_AMBULATORY_CARE_PROVIDER_SITE_OTHER): Payer: Medicare Other | Admitting: Pediatrics

## 2016-12-13 ENCOUNTER — Encounter: Payer: Self-pay | Admitting: Pediatrics

## 2016-12-13 VITALS — BP 132/77 | HR 96 | Temp 96.9°F | Ht 70.0 in | Wt 186.4 lb

## 2016-12-13 DIAGNOSIS — M25511 Pain in right shoulder: Secondary | ICD-10-CM | POA: Diagnosis not present

## 2016-12-13 DIAGNOSIS — N61 Mastitis without abscess: Secondary | ICD-10-CM

## 2016-12-13 DIAGNOSIS — I481 Persistent atrial fibrillation: Secondary | ICD-10-CM | POA: Diagnosis not present

## 2016-12-13 DIAGNOSIS — R399 Unspecified symptoms and signs involving the genitourinary system: Secondary | ICD-10-CM | POA: Diagnosis not present

## 2016-12-13 DIAGNOSIS — G8929 Other chronic pain: Secondary | ICD-10-CM | POA: Diagnosis not present

## 2016-12-13 LAB — URINALYSIS, COMPLETE
Bilirubin, UA: NEGATIVE
Glucose, UA: NEGATIVE
Ketones, UA: NEGATIVE
Nitrite, UA: POSITIVE — AB
Protein, UA: NEGATIVE
RBC, UA: NEGATIVE
Specific Gravity, UA: 1.015 (ref 1.005–1.030)
Urobilinogen, Ur: 0.2 mg/dL (ref 0.2–1.0)
pH, UA: 8.5 — ABNORMAL HIGH (ref 5.0–7.5)

## 2016-12-13 LAB — MICROSCOPIC EXAMINATION
Renal Epithel, UA: NONE SEEN /hpf
WBC, UA: >30 /hpf — AB (ref 0–?)

## 2016-12-13 LAB — COAGUCHEK XS/INR WAIVED
INR: 2.4 — ABNORMAL HIGH (ref 0.9–1.1)
Prothrombin Time: 29.2 s

## 2016-12-13 MED ORDER — CEPHALEXIN 500 MG PO CAPS: 500.0000 mg | ORAL_CAPSULE | Freq: Four times a day (QID) | ORAL | 0 refills | Status: DC

## 2016-12-13 MED ORDER — DICLOFENAC SODIUM 1 % TD GEL: 4.0000 g | Freq: Four times a day (QID) | TRANSDERMAL | 0 refills | Status: DC

## 2016-12-13 NOTE — Patient Instructions (Addendum)
aspercreme topical if needed for pain is over the counter if the diclofenac cream prescription is too expensive  We are testing your urine for infection  We are treating you for an infection of your breast with keflex--take three times a day for 1 week  I put in referral to see the shoulder specialist  Come back for INR on Wednesday next week

## 2016-12-13 NOTE — Progress Notes (Signed)
Subjective:   Patient ID: Virginia Young, female    DOB: 04/30/1941, 76 y.o.   MRN: [id number] CC: Follow-up (1 month) multiple med problems  HPI: Virginia Young is a 76 y.o. female presenting for Follow-up (1 month)  Breast pain: continues to have breast pain R breast Feels swollen at times Had diagnostic mammogram done since last visit that was normal Hurts to wear a bra, hurts to lay on R side  R arm pain: has arthritis in R shoulder seen on xray 2/18 Has MRI of cervical spine with foraminal stenosis C3-4 Has had nerve conduction study that was normal 11/20/2016 with neurology Primarily R arm affected  Continues to have dysuria Treated recently with macrobid for UTI, did not help symptoms No fevers Appetite is ok  No fevers, appetite has been ok Says sometimes she sits and cries at home the pain in her R side, R breast, R shoulder and arm are so great  Spoke with daughter [name] on phone at pt's request Daughter says some days she is up, doing activities like normal Next day may say she is so much pain wants to be seen in emergency room Pt driving, living alone Daughter says her memory isnt what it used to be, she writes everything down to remember it Pt still keeps 5yo granddaughter some days, pam says pt always seems to have good energy and is feeling well on those days  On anticoagulation for atrial fibrillation  Relevant past medical, surgical, family and social history reviewed. Allergies and medications reviewed and updated. History  Smoking Status  . Never Smoker  Smokeless Tobacco  . Never Used   ROS: Per HPI   Objective:    BP 132/77   Pulse 96   Temp (!) 96.9 F (36.1 C) (Oral)   Ht 5\' 10"  (1.778 m)   Wt 186 lb 6.4 oz (84.6 kg)   BMI 26.75 kg/m   Wt Readings from Last 3 Encounters:  12/13/16 186 lb 6.4 oz (84.6 kg)  11/29/16 183 lb (83 kg)  11/15/16 186 lb (84.4 kg)     Gen: NAD, alert, cooperative with exam, NCAT EYES: EOMI, no  conjunctival injection, or no icterus ENT: OP without erythema LYMPH: no cervical LAD CV: NRRR, normal S1/S2 Resp: CTABL, no wheezes, normal WOB Abd: +BS, soft, NTND.  Ext: No edema, warm Neuro: Alert and oriented MSK: bony enlargement PIP, MCP joints slightly more R hand than L hand ttp over AC joint, soft tissue posterior shoulder, anterior shoulder, soft tissue of trapezius muscle top of shoulder Sensation normal b/l UE +scoliosis  Skin: R breast slightly pink around areola compared with L breast  Assessment & Plan:  Kaylei was seen today for follow-up multiple med problems  Diagnoses and all orders for this visit:  Chronic right shoulder pain Ongoing pain, does have arthritis on xray -     Ambulatory referral to Orthopedic Surgery -     diclofenac sodium (VOLTAREN) 1 % GEL; Apply 4 g topically 4 (four) times daily.  UTI symptoms Treat with keflex as below, f/u urine culture -     Urinalysis, Complete -     Urine Culture  Cellulitis of breast Treat for cellulitis given rednes,s pain, neg mammo for 1 week INR 2.4 today, needs repeat next week -     cephALEXin (KEFLEX) 500 MG capsule; Take 1 capsule (500 mg total) by mouth 4 (four) times daily. -     CoaguChek XS/INR Waived  Follow up  plan: Return in about 4 weeks (around 01/10/2017). Sooner if needed Assunta Found, MD [place]

## 2016-12-16 ENCOUNTER — Ambulatory Visit: Payer: Medicare Other | Admitting: Physical Therapy

## 2016-12-16 DIAGNOSIS — M545 Low back pain: Principal | ICD-10-CM

## 2016-12-16 DIAGNOSIS — M25511 Pain in right shoulder: Secondary | ICD-10-CM

## 2016-12-16 DIAGNOSIS — G8929 Other chronic pain: Secondary | ICD-10-CM

## 2016-12-16 DIAGNOSIS — M6281 Muscle weakness (generalized): Secondary | ICD-10-CM

## 2016-12-16 DIAGNOSIS — R293 Abnormal posture: Secondary | ICD-10-CM

## 2016-12-16 LAB — URINE CULTURE

## 2016-12-16 NOTE — Therapy (Signed)
[facility] [place], [place], [address] Phone: [phone]   Fax:  [phone]  Physical Therapy Treatment  Patient Details  Name: LAURIS KEEPERS MRN: [id number] Date of Birth: 01/15/1941 Referring Provider: Evelina Dun  Encounter Date: 12/16/2016      PT End of Session - 12/16/16 0948    Visit Number 8   Number of Visits 16   Date for PT Re-Evaluation 01/20/17   PT Start Time 0947   PT Stop Time 1045   PT Time Calculation (min) 58 min   Activity Tolerance Patient tolerated treatment well   Behavior During Therapy [facility] for tasks assessed/performed      Past Medical History:  Diagnosis Date  . Allergy   . Atrial fibrillation (HCC)    a. coumadin;  b. Amiodarone  . Cataract   . Chest pain 03/2012   a. Lex MV 10/13:  EF 64%, dist ant and apical defect sugg of soft tissue atten, no ischemia  . Chronic systolic heart failure ([place])   . Depression   . GERD (gastroesophageal reflux disease)   . HLD (hyperlipidemia)   . Incontinence of urine   . Mild mitral regurgitation   . MS (multiple sclerosis) ([place])   . NICM (nonischemic cardiomyopathy) ([place])    a. neg CLite in 2003;  b. EF 40-45% in past;   c.  Echo 12/11: EF 35-40%, mild MR, mild LAE, mild RAE, small pericardial effusion   . Osteoarthritis   . Osteoporosis   . Stasis ulcer ([place])   . Uterine prolapse   . Vaginal prolapse without uterine prolapse   . Varicose veins     Past Surgical History:  Procedure Laterality Date  . ANTERIOR AND POSTERIOR VAGINAL REPAIR W/ SACROSPINOUS LIGAMENT SUSPENSION  2016   [facility]  . BLADDER SURGERY    . cataract surgery Bilateral   . EYE SURGERY    . INGUINAL HERNIA REPAIR     right  . LIPOMA EXCISION     h/o removed from upper back x 2    There were no vitals filed for this visit.      Subjective Assessment - 12/16/16 0949    Subjective Patient continues to report pain and tingling in her R upper quadrant and R  low back and hip.   Pertinent History MS; OP.   Limitations Walking   How long can you stand comfortably? <10 minutes.   How long can you walk comfortably? Short distances.   Diagnostic tests NCV test to right UE.  X-rays   Patient Stated Goals Move better with less pain.   Currently in Pain? Yes   Pain Score 5    Pain Location Back   Pain Orientation Lower   Pain Descriptors / Indicators Discomfort   Pain Type Chronic pain   Multiple Pain Sites Yes   Pain Score 5   Pain Location Shoulder   Pain Orientation Right   Pain Descriptors / Indicators Discomfort   Pain Type Chronic pain                         [facility] Adult PT Treatment/Exercise - 12/16/16 0001      Lumbar Exercises: Aerobic   Stationary Bike NuStep L5 x15 min UE/LE activity, monitored vitals     Lumbar Exercises: Seated   Hip Flexion on Ball Strengthening;Both;20 reps   Hip Flexion on Ball Limitations in chair   Sit to Stand 10 reps  2 sets of 5 (   Sit to Stand Limitations hip ADD ball squeeze 5 sec hold x 10     Shoulder Exercises: Seated   External Rotation Strengthening;Right;Theraband;20 reps   Theraband Level (Shoulder External Rotation) Level 1 (Yellow)   Internal Rotation Strengthening;Right;20 reps;Theraband   Theraband Level (Shoulder Internal Rotation) Level 1 (Yellow)   Other Seated Exercises scap retractions with yellow t-band 2x10     Shoulder Exercises: Pulleys   Flexion Other (comment)  53min   Other Pulley Exercises UE ranger into flexion,circles x20 reps in sitting x 4 mins     Modalities   Modalities Electrical Stimulation     Moist Heat Therapy   Number Minutes Moist Heat 15 Minutes   Moist Heat Location Shoulder;Lumbar Spine     Electrical Stimulation   Electrical Stimulation Location R shoulder; R low back Premod 80-150hz  x15 mins   Electrical Stimulation Action sitting   Electrical Stimulation Goals Pain                  PT Short Term Goals - 11/21/16  1349      PT SHORT TERM GOAL #1   Title STG's=LTG's.           PT Long Term Goals - 11/27/16 8828      PT LONG TERM GOAL #1   Title Independent with a HEP.   Time 8   Period Weeks   Status On-going     PT LONG TERM GOAL #2   Title Active right shoulder flexion to 145 degrees so the patient can easily reach overhead   Time 8   Period Weeks   Status On-going     PT LONG TERM GOAL #3   Title Active ER to 80 degrees+ to allow for easily donning/doffing of apparel   Time 8   Period Weeks   Status On-going     PT LONG TERM GOAL #4   Title Increase right shoulder and right LE strength to a solid 4+/5 to increase stability for performance of functional activities   Time 8   Period Weeks   Status On-going     PT LONG TERM GOAL #5   Title Stand 20 minutes with pain not > 3/10.   Time 8   Period Weeks   Status On-going     PT LONG TERM GOAL #6   Title Walk a community distance with pain not > 3/10.   Time 8   Period Weeks     PT LONG TERM GOAL #7   Title Sleep undisturbed 6 hours.   Time 8   Period Weeks   Status On-going               Plan - 12/16/16 1525    Clinical Impression Statement Patient did well with treatment.    PT Treatment/Interventions ADLs/Self Care Home Management;Cryotherapy;Electrical Stimulation;Ultrasound;Moist Heat;Functional mobility training;Therapeutic activities;Therapeutic exercise;Neuromuscular re-education;Patient/family education;Passive range of motion;Vasopneumatic Device;Manual techniques   PT Next Visit Plan Continue core/lumbar strengthening as well as shoulder ROM/strengthening with modalities PRN for pain per MPT POC. monitor vitals   Consulted and Agree with Plan of Care Patient      Patient will benefit from skilled therapeutic intervention in order to improve the following deficits and impairments:  Pain, Decreased strength, Impaired UE functional use, Decreased range of motion, Postural dysfunction  Visit  Diagnosis: Chronic right-sided low back pain without sciatica  Chronic right shoulder pain  Abnormal posture  Muscle weakness (generalized)  Problem List Patient Active Problem List   Diagnosis Date Noted  . HTN (hypertension) 02/19/2016  . Post-nasal drip 05/15/2015  . Postoperative abdominal pain 12/07/2014  . Voiding dysfunction 10/07/2014  . SUI (stress urinary incontinence, female) 10/07/2014  . Osteoporosis with fracture 04/06/2014  . Impacted cerumen of both ears 08/20/2013  . Encounter for therapeutic drug monitoring 08/20/2013  . DOE (dyspnea on exertion) 07/23/2013  . Helicobacter positive gastritis 06/21/2013  . Orthostatic hypotension 06/10/2013  . UTI (lower urinary tract infection) 05/06/2013  . Neck mass 02/19/2013  . Neck pain, bilateral 02/19/2013  . Right groin mass 11/08/2011  . Low back pain 11/08/2011  . Lipoma of neck 06/21/2011  . Memory loss 06/21/2011  . OVERACTIVE BLADDER 08/21/2010  . Long term current use of anticoagulant 07/25/2010  . Osteoarthritis 01/18/2010  . KNEE PAIN 01/18/2010  . FOOT PAIN 01/18/2010  . Hyperlipidemia with target LDL less than 100 04/20/2009  . GERD 04/17/2009  . MITRAL REGURGITATION, MILD 08/30/2008  . NICM (nonischemic cardiomyopathy) ([place]) 08/30/2008  . Congestive heart failure ([place]) 04/13/2007  . DEPRESSION 04/10/2007  . DISEASE, MITRAL VALVE NEC/NOS 04/10/2007  . Atrial fibrillation ([place]) 04/10/2007  . STASIS ULCER 04/10/2007  . VENOUS INSUFFICIENCY, LEGS 04/10/2007   Madelyn Flavors PT 12/16/2016, 3:31 PM  [facility] [address] [place], [place], [address] Phone: [phone]   Fax:  [phone]  Name: JACKI COUSE MRN: [id number] Date of Birth: 1941-02-27

## 2016-12-18 ENCOUNTER — Ambulatory Visit: Payer: Medicare Other | Admitting: Physical Therapy

## 2016-12-18 ENCOUNTER — Encounter: Payer: Self-pay | Admitting: Physical Therapy

## 2016-12-18 DIAGNOSIS — M25511 Pain in right shoulder: Secondary | ICD-10-CM | POA: Diagnosis not present

## 2016-12-18 DIAGNOSIS — M6281 Muscle weakness (generalized): Secondary | ICD-10-CM

## 2016-12-18 DIAGNOSIS — G8929 Other chronic pain: Secondary | ICD-10-CM

## 2016-12-18 DIAGNOSIS — M545 Low back pain, unspecified: Secondary | ICD-10-CM

## 2016-12-18 DIAGNOSIS — R293 Abnormal posture: Secondary | ICD-10-CM | POA: Diagnosis not present

## 2016-12-18 NOTE — Therapy (Signed)
[facility] [place], [place], [address] Phone: [phone]   Fax:  [phone]  Physical Therapy Treatment  Patient Details  Name: Virginia Young MRN: [id number] Date of Birth: 02-16-1941 Referring Provider: Evelina Dun  Encounter Date: 12/18/2016      PT End of Session - 12/18/16 0901    Visit Number 9   Number of Visits 16   Date for PT Re-Evaluation 01/20/17   PT Start Time 0900   PT Stop Time 0953   PT Time Calculation (min) 53 min   Activity Tolerance Patient tolerated treatment well   Behavior During Therapy [facility] for tasks assessed/performed      Past Medical History:  Diagnosis Date  . Allergy   . Atrial fibrillation (HCC)    a. coumadin;  b. Amiodarone  . Cataract   . Chest pain 03/2012   a. Lex MV 10/13:  EF 64%, dist ant and apical defect sugg of soft tissue atten, no ischemia  . Chronic systolic heart failure ([place])   . Depression   . GERD (gastroesophageal reflux disease)   . HLD (hyperlipidemia)   . Incontinence of urine   . Mild mitral regurgitation   . MS (multiple sclerosis) ([place])   . NICM (nonischemic cardiomyopathy) ([place])    a. neg CLite in 2003;  b. EF 40-45% in past;   c.  Echo 12/11: EF 35-40%, mild MR, mild LAE, mild RAE, small pericardial effusion   . Osteoarthritis   . Osteoporosis   . Stasis ulcer ([place])   . Uterine prolapse   . Vaginal prolapse without uterine prolapse   . Varicose veins     Past Surgical History:  Procedure Laterality Date  . ANTERIOR AND POSTERIOR VAGINAL REPAIR W/ SACROSPINOUS LIGAMENT SUSPENSION  2016   [facility]  . BLADDER SURGERY    . cataract surgery Bilateral   . EYE SURGERY    . INGUINAL HERNIA REPAIR     right  . LIPOMA EXCISION     h/o removed from upper back x 2    There were no vitals filed for this visit.      Subjective Assessment - 12/18/16 0900    Subjective Reports that her low back and shoulder are still hurting "pretty bad."   Pertinent History MS; OP.   Limitations Walking   How long can you stand comfortably? <10 minutes.   How long can you walk comfortably? Short distances.   Diagnostic tests NCV test to right UE.  X-rays   Patient Stated Goals Move better with less pain.   Currently in Pain? Yes   Pain Score --  No pain score provided by patient for low back or shoulder   Pain Location Back   Pain Descriptors / Indicators Discomfort   Pain Type Chronic pain   Pain Onset More than a month ago            [facility] PT Assessment - 12/18/16 0001      Assessment   Medical Diagnosis OA involving multiple joints.     Restrictions   Weight Bearing Restrictions No                     [facility] Adult PT Treatment/Exercise - 12/18/16 0001      Lumbar Exercises: Aerobic   Stationary Bike NuStep L5 x15 min UE/LE activity  94%, 77 bpm after NuStep     Lumbar Exercises: Seated   Hip Flexion on Ball Strengthening;Both;15 reps  Hip Flexion on Ball Limitations in chair   Sit to Stand 10 reps  reported having "bad knees"   Sit to Stand Limitations B hip squeeze in chair with posture focus x15 reps     Shoulder Exercises: Seated   Retraction Strengthening;Both;20 reps;Theraband   Theraband Level (Shoulder Retraction) Level 1 (Yellow)   External Rotation Strengthening;Right;Theraband;20 reps   Theraband Level (Shoulder External Rotation) Level 1 (Yellow)   Internal Rotation Strengthening;Right;20 reps;Theraband   Theraband Level (Shoulder Internal Rotation) Level 1 (Yellow)     Shoulder Exercises: Pulleys   Flexion Other (comment)  x6 min     Modalities   Modalities Electrical Stimulation;Moist Heat     Moist Heat Therapy   Number Minutes Moist Heat 15 Minutes   Moist Heat Location Shoulder;Lumbar Spine     Electrical Stimulation   Electrical Stimulation Location R shoulder, B low back   Electrical Stimulation Action Pre-Mod in sitting   Electrical Stimulation Parameters 80-150 hz x15  min   Electrical Stimulation Goals Pain                  PT Short Term Goals - 11/21/16 1349      PT SHORT TERM GOAL #1   Title STG's=LTG's.           PT Long Term Goals - 11/27/16 3295      PT LONG TERM GOAL #1   Title Independent with a HEP.   Time 8   Period Weeks   Status On-going     PT LONG TERM GOAL #2   Title Active right shoulder flexion to 145 degrees so the patient can easily reach overhead   Time 8   Period Weeks   Status On-going     PT LONG TERM GOAL #3   Title Active ER to 80 degrees+ to allow for easily donning/doffing of apparel   Time 8   Period Weeks   Status On-going     PT LONG TERM GOAL #4   Title Increase right shoulder and right LE strength to a solid 4+/5 to increase stability for performance of functional activities   Time 8   Period Weeks   Status On-going     PT LONG TERM GOAL #5   Title Stand 20 minutes with pain not > 3/10.   Time 8   Period Weeks   Status On-going     PT LONG TERM GOAL #6   Title Walk a community distance with pain not > 3/10.   Time 8   Period Weeks     PT LONG TERM GOAL #7   Title Sleep undisturbed 6 hours.   Time 8   Period Weeks   Status On-going               Plan - 12/18/16 0940    Clinical Impression Statement Patient tolerated today's treatment fairly well with reports of greater R shoulder pain than low back pain today but did not provide pain score for either area. Patient required VCs for R shoulder ER/IR resisted for proper technique. Patient also required VCs for erect posture with all seated low back exercises today. No complaints of any increased pain with any exercises today and patient denied any increased pain with exercises today. Normal modalities response noted following removal of the modalities.   PT Frequency 2x / week   PT Duration 8 weeks   PT Treatment/Interventions ADLs/Self Care Home Management;Cryotherapy;Electrical Stimulation;Ultrasound;Moist Heat;Functional  mobility training;Therapeutic activities;Therapeutic exercise;Neuromuscular re-education;Patient/family education;Passive  range of motion;Vasopneumatic Device;Manual techniques   PT Next Visit Plan Continue core/lumbar strengthening as well as shoulder ROM/strengthening with modalities PRN for pain per MPT POC. monitor vitals   Consulted and Agree with Plan of Care Patient      Patient will benefit from skilled therapeutic intervention in order to improve the following deficits and impairments:  Pain, Decreased strength, Impaired UE functional use, Decreased range of motion, Postural dysfunction  Visit Diagnosis: Chronic right-sided low back pain without sciatica  Chronic right shoulder pain  Abnormal posture  Muscle weakness (generalized)     Problem List Patient Active Problem List   Diagnosis Date Noted  . HTN (hypertension) 02/19/2016  . Post-nasal drip 05/15/2015  . Postoperative abdominal pain 12/07/2014  . Voiding dysfunction 10/07/2014  . SUI (stress urinary incontinence, female) 10/07/2014  . Osteoporosis with fracture 04/06/2014  . Impacted cerumen of both ears 08/20/2013  . Encounter for therapeutic drug monitoring 08/20/2013  . DOE (dyspnea on exertion) 07/23/2013  . Helicobacter positive gastritis 06/21/2013  . Orthostatic hypotension 06/10/2013  . UTI (lower urinary tract infection) 05/06/2013  . Neck mass 02/19/2013  . Neck pain, bilateral 02/19/2013  . Right groin mass 11/08/2011  . Low back pain 11/08/2011  . Lipoma of neck 06/21/2011  . Memory loss 06/21/2011  . OVERACTIVE BLADDER 08/21/2010  . Long term current use of anticoagulant 07/25/2010  . Osteoarthritis 01/18/2010  . KNEE PAIN 01/18/2010  . FOOT PAIN 01/18/2010  . Hyperlipidemia with target LDL less than 100 04/20/2009  . GERD 04/17/2009  . MITRAL REGURGITATION, MILD 08/30/2008  . NICM (nonischemic cardiomyopathy) ([place]) 08/30/2008  . Congestive heart failure ([place]) 04/13/2007  . DEPRESSION  04/10/2007  . DISEASE, MITRAL VALVE NEC/NOS 04/10/2007  . Atrial fibrillation ([place]) 04/10/2007  . STASIS ULCER 04/10/2007  . VENOUS INSUFFICIENCY, LEGS 04/10/2007    Wynelle Fanny, PTA 12/18/2016, 9:59 AM  [facility] [address] [place], [place], [address] Phone: [phone]   Fax:  [phone]  Name: Virginia Young MRN: [id number] Date of Birth: 10-01-1940

## 2016-12-19 ENCOUNTER — Encounter: Payer: Self-pay | Admitting: Pharmacist

## 2016-12-20 ENCOUNTER — Other Ambulatory Visit: Payer: Self-pay | Admitting: Family Medicine

## 2016-12-23 ENCOUNTER — Ambulatory Visit: Payer: Medicare Other | Attending: Family | Admitting: Physical Therapy

## 2016-12-23 ENCOUNTER — Encounter: Payer: Self-pay | Admitting: Physical Therapy

## 2016-12-23 DIAGNOSIS — M545 Low back pain, unspecified: Secondary | ICD-10-CM

## 2016-12-23 DIAGNOSIS — M6281 Muscle weakness (generalized): Secondary | ICD-10-CM | POA: Diagnosis not present

## 2016-12-23 DIAGNOSIS — R293 Abnormal posture: Secondary | ICD-10-CM | POA: Diagnosis not present

## 2016-12-23 DIAGNOSIS — G8929 Other chronic pain: Secondary | ICD-10-CM | POA: Diagnosis not present

## 2016-12-23 DIAGNOSIS — M25511 Pain in right shoulder: Secondary | ICD-10-CM | POA: Insufficient documentation

## 2016-12-23 NOTE — Therapy (Signed)
[facility] [place], [place], [address] Phone: [phone]   Fax:  [phone]  Physical Therapy Treatment  Patient Details  Name: Virginia Young MRN: [id number] Date of Birth: [birth year] Referring Provider: Evelina Dun  Encounter Date: 12/23/2016      PT End of Session - 12/23/16 0942    Visit Number 10   Number of Visits 16   Date for PT Re-Evaluation 01/20/17   PT Start Time 0900   PT Stop Time 0958   PT Time Calculation (min) 58 min   Activity Tolerance Patient tolerated treatment well   Behavior During Therapy [facility] for tasks assessed/performed      Past Medical History:  Diagnosis Date  . Allergy   . Atrial fibrillation (HCC)    a. coumadin;  b. Amiodarone  . Cataract   . Chest pain 03/2012   a. Lex MV 10/13:  EF 64%, dist ant and apical defect sugg of soft tissue atten, no ischemia  . Chronic systolic heart failure ([place])   . Depression   . GERD (gastroesophageal reflux disease)   . HLD (hyperlipidemia)   . Incontinence of urine   . Mild mitral regurgitation   . MS (multiple sclerosis) ([place])   . NICM (nonischemic cardiomyopathy) ([place])    a. neg CLite in 2003;  b. EF 40-45% in past;   c.  Echo 12/11: EF 35-40%, mild MR, mild LAE, mild RAE, small pericardial effusion   . Osteoarthritis   . Osteoporosis   . Stasis ulcer ([place])   . Uterine prolapse   . Vaginal prolapse without uterine prolapse   . Varicose veins     Past Surgical History:  Procedure Laterality Date  . ANTERIOR AND POSTERIOR VAGINAL REPAIR W/ SACROSPINOUS LIGAMENT SUSPENSION  2016   [facility]  . BLADDER SURGERY    . cataract surgery Bilateral   . EYE SURGERY    . INGUINAL HERNIA REPAIR     right  . LIPOMA EXCISION     h/o removed from upper back x 2    There were no vitals filed for this visit.      Subjective Assessment - 12/23/16 0911    Subjective Patient reported ongoing pain in low back and shoulders   Pertinent History  MS; OP.   Limitations Walking   How long can you stand comfortably? <10 minutes.   How long can you walk comfortably? Short distances.   Diagnostic tests NCV test to right UE.  X-rays   Patient Stated Goals Move better with less pain.   Currently in Pain? Yes   Pain Score 4    Pain Location Back   Pain Orientation Lower   Pain Descriptors / Indicators Discomfort   Pain Type Chronic pain   Pain Onset More than a month ago   Pain Frequency Constant   Aggravating Factors  movement or prolong activity   Pain Relieving Factors at rest   Pain Score 5   Pain Location Shoulder   Pain Orientation Right   Pain Descriptors / Indicators Discomfort   Pain Type Chronic pain   Pain Onset More than a month ago   Pain Frequency Constant   Aggravating Factors  movement or overhead activity   Pain Relieving Factors at rest            [facility] PT Assessment - 12/23/16 0001      ROM / Strength   AROM / PROM / Strength AROM;PROM     AROM  AROM Assessment Site Shoulder   Right/Left Shoulder Right   Right Shoulder Flexion 95 Degrees     PROM   PROM Assessment Site Shoulder   Right/Left Shoulder Right   Right Shoulder Flexion 130 Degrees                     [facility] Adult PT Treatment/Exercise - 12/23/16 0001      Lumbar Exercises: Aerobic   Stationary Bike NuStep L5 x15 min UE/LE activity     Shoulder Exercises: Seated   Extension Strengthening;Right;20 reps;Theraband   Theraband Level (Shoulder Extension) Level 1 (Yellow)   Retraction Strengthening;Both;20 reps;Theraband   Theraband Level (Shoulder Retraction) Level 1 (Yellow)   Row Strengthening;Right;Theraband;20 reps   Theraband Level (Shoulder Row) Level 1 (Yellow)   External Rotation Strengthening;Right;Theraband;20 reps   Theraband Level (Shoulder External Rotation) Level 1 (Yellow)   Internal Rotation Strengthening;Right;20 reps;Theraband   Theraband Level (Shoulder Internal Rotation) Level 1 (Yellow)   Other  Seated Exercises seated cane for flexion 2x10     Shoulder Exercises: Pulleys   Flexion Other (comment)  38min     Moist Heat Therapy   Number Minutes Moist Heat 15 Minutes   Moist Heat Location Shoulder;Lumbar Spine     Electrical Stimulation   Electrical Stimulation Location R shoulder, B low back   Electrical Stimulation Action premod   Electrical Stimulation Parameters 80-150hz  x44min   Electrical Stimulation Goals Pain     Manual Therapy   Manual Therapy Passive ROM   Passive ROM gentle PROM for right shoulder elevationand ER with gentle slow movements                  PT Short Term Goals - 11/21/16 1349      PT SHORT TERM GOAL #1   Title STG's=LTG's.           PT Long Term Goals - 12/23/16 0915      PT LONG TERM GOAL #1   Title Independent with a HEP.   Time 8   Period Weeks   Status On-going     PT LONG TERM GOAL #2   Title Active right shoulder flexion to 145 degrees so the patient can easily reach overhead   Time 8   Period Weeks   Status On-going  AROM 95 degrees 12/23/16     PT LONG TERM GOAL #3   Title Active ER to 80 degrees+ to allow for easily donning/doffing of apparel   Time 8   Period Weeks   Status On-going     PT LONG TERM GOAL #4   Title Increase right shoulder and right LE strength to a solid 4+/5 to increase stability for performance of functional activities   Time 8   Period Weeks   Status On-going     PT LONG TERM GOAL #5   Title Stand 20 minutes with pain not > 3/10.   Time 8   Period Weeks   Status On-going     PT LONG TERM GOAL #6   Title Walk a community distance with pain not > 3/10.   Time 8   Period Weeks   Status On-going     PT LONG TERM GOAL #7   Title Sleep undisturbed 6 hours.   Time 8   Period Weeks   Status On-going  only 2 hours per patient 12/23/16               Plan - 12/23/16 [phone]  Clinical Impression Statement Patient tolerated treatment fairly well with ongoing pain which limits  her active movements in right shoulder. Patient unable to reach overhead due to pain in right shoulder. Patient able to complete exercises slowly and half range today. Patient unable to stand with upright posture. Patient feels 10% improvement thus far. FOTO current limitation 53% (initial 67%). Goals ongoing due to pain, strength and ROM deficts.    PT Frequency 2x / week   PT Duration 8 weeks   PT Treatment/Interventions ADLs/Self Care Home Management;Cryotherapy;Electrical Stimulation;Ultrasound;Moist Heat;Functional mobility training;Therapeutic activities;Therapeutic exercise;Neuromuscular re-education;Patient/family education;Passive range of motion;Vasopneumatic Device;Manual techniques   PT Next Visit Plan Continue core/lumbar strengthening as well as shoulder ROM/strengthening with modalities PRN for pain per MPT POC. monitor vitals   Consulted and Agree with Plan of Care Patient      Patient will benefit from skilled therapeutic intervention in order to improve the following deficits and impairments:  Pain, Decreased strength, Impaired UE functional use, Decreased range of motion, Postural dysfunction  Visit Diagnosis: Chronic right-sided low back pain without sciatica  Chronic right shoulder pain  Abnormal posture  Muscle weakness (generalized)     Problem List Patient Active Problem List   Diagnosis Date Noted  . HTN (hypertension) 02/19/2016  . Post-nasal drip 05/15/2015  . Postoperative abdominal pain 12/07/2014  . Voiding dysfunction 10/07/2014  . SUI (stress urinary incontinence, female) 10/07/2014  . Osteoporosis with fracture 04/06/2014  . Impacted cerumen of both ears 08/20/2013  . Encounter for therapeutic drug monitoring 08/20/2013  . DOE (dyspnea on exertion) 07/23/2013  . Helicobacter positive gastritis 06/21/2013  . Orthostatic hypotension 06/10/2013  . UTI (lower urinary tract infection) 05/06/2013  . Neck mass 02/19/2013  . Neck pain, bilateral  02/19/2013  . Right groin mass 11/08/2011  . Low back pain 11/08/2011  . Lipoma of neck 06/21/2011  . Memory loss 06/21/2011  . OVERACTIVE BLADDER 08/21/2010  . Long term current use of anticoagulant 07/25/2010  . Osteoarthritis 01/18/2010  . KNEE PAIN 01/18/2010  . FOOT PAIN 01/18/2010  . Hyperlipidemia with target LDL less than 100 04/20/2009  . GERD 04/17/2009  . MITRAL REGURGITATION, MILD 08/30/2008  . NICM (nonischemic cardiomyopathy) ([place]) 08/30/2008  . Congestive heart failure ([place]) 04/13/2007  . DEPRESSION 04/10/2007  . DISEASE, MITRAL VALVE NEC/NOS 04/10/2007  . Atrial fibrillation ([place]) 04/10/2007  . STASIS ULCER 04/10/2007  . VENOUS INSUFFICIENCY, LEGS 04/10/2007    Ladean Raya, PTA 12/23/16 10:35 AM  [facility] [address] [place], [place], [address] Phone: [phone]   Fax:  [phone]  Name: SKYYLAR KOPF MRN: [id number] Date of Birth: 1941/01/03

## 2016-12-24 DIAGNOSIS — M25511 Pain in right shoulder: Secondary | ICD-10-CM | POA: Diagnosis not present

## 2016-12-30 ENCOUNTER — Encounter: Payer: Medicare Other | Admitting: Physical Therapy

## 2017-01-01 ENCOUNTER — Encounter: Payer: Self-pay | Admitting: Physical Therapy

## 2017-01-01 ENCOUNTER — Ambulatory Visit: Payer: Medicare Other | Admitting: Physical Therapy

## 2017-01-01 DIAGNOSIS — M545 Low back pain, unspecified: Secondary | ICD-10-CM

## 2017-01-01 DIAGNOSIS — R293 Abnormal posture: Secondary | ICD-10-CM

## 2017-01-01 DIAGNOSIS — M6281 Muscle weakness (generalized): Secondary | ICD-10-CM | POA: Diagnosis not present

## 2017-01-01 DIAGNOSIS — M25511 Pain in right shoulder: Secondary | ICD-10-CM

## 2017-01-01 DIAGNOSIS — G8929 Other chronic pain: Secondary | ICD-10-CM

## 2017-01-01 NOTE — Therapy (Addendum)
[facility] [place], [place], [address] Phone: [phone]   Fax:  [phone]  Physical Therapy Treatment  Patient Details  Name: Virginia Young MRN: [id number] Date of Birth: 10/21/1940 Referring Provider: Evelina Dun  Encounter Date: 01/01/2017      PT End of Session - 01/01/17 0944    Visit Number 11   Number of Visits 16   Date for PT Re-Evaluation 01/20/17   PT Start Time 0900   PT Stop Time 0946   PT Time Calculation (min) 46 min   Activity Tolerance Patient tolerated treatment well   Behavior During Therapy [facility] for tasks assessed/performed      Past Medical History:  Diagnosis Date  . Allergy   . Atrial fibrillation (HCC)    a. coumadin;  b. Amiodarone  . Cataract   . Chest pain 03/2012   a. Lex MV 10/13:  EF 64%, dist ant and apical defect sugg of soft tissue atten, no ischemia  . Chronic systolic heart failure ([place])   . Depression   . GERD (gastroesophageal reflux disease)   . HLD (hyperlipidemia)   . Incontinence of urine   . Mild mitral regurgitation   . MS (multiple sclerosis) ([place])   . NICM (nonischemic cardiomyopathy) ([place])    a. neg CLite in 2003;  b. EF 40-45% in past;   c.  Echo 12/11: EF 35-40%, mild MR, mild LAE, mild RAE, small pericardial effusion   . Osteoarthritis   . Osteoporosis   . Stasis ulcer ([place])   . Uterine prolapse   . Vaginal prolapse without uterine prolapse   . Varicose veins     Past Surgical History:  Procedure Laterality Date  . ANTERIOR AND POSTERIOR VAGINAL REPAIR W/ SACROSPINOUS LIGAMENT SUSPENSION  2016   [facility]  . BLADDER SURGERY    . cataract surgery Bilateral   . EYE SURGERY    . INGUINAL HERNIA REPAIR     right  . LIPOMA EXCISION     h/o removed from upper back x 2    There were no vitals filed for this visit.      Subjective Assessment - 01/01/17 0905    Subjective Patient reported ongoing pain in low back and shoulders   Pertinent History  MS; OP.   Limitations Walking   How long can you stand comfortably? <10 minutes.   How long can you walk comfortably? Short distances.   Diagnostic tests NCV test to right UE.  X-rays   Patient Stated Goals Move better with less pain.   Currently in Pain? Yes   Pain Score 4    Pain Location Back   Pain Orientation Lower;Mid;Right   Pain Descriptors / Indicators Discomfort   Pain Type Chronic pain   Pain Onset More than a month ago   Aggravating Factors  movement and activity   Pain Relieving Factors at rest   Pain Score 5   Pain Location Shoulder   Pain Orientation Right   Pain Descriptors / Indicators Discomfort   Pain Type Chronic pain   Pain Onset More than a month ago   Pain Frequency Constant   Aggravating Factors  overhead movement   Pain Relieving Factors at rest            [facility] PT Assessment - 01/01/17 0001      AROM   AROM Assessment Site Shoulder   Right/Left Shoulder Right   Right Shoulder Flexion 103 Degrees  [facility] Adult PT Treatment/Exercise - 01/01/17 0001      Lumbar Exercises: Aerobic   Stationary Bike NuStep L5 x15 min UE/LE activity     Lumbar Exercises: Supine   Other Supine Lumbar Exercises seated with swiss ball for "W", "T", "V" 2x10 each     Shoulder Exercises: Pulleys   Flexion Other (comment)  47mn   Flexion Limitations with towel roll for posture focus     Moist Heat Therapy   Number Minutes Moist Heat 15 Minutes   Moist Heat Location Shoulder;Lumbar Spine     Electrical Stimulation   Electrical Stimulation Location R shoulder, rt mid/ low back   Electrical Stimulation Action premod   Electrical Stimulation Parameters 80-150hz x160m   Electrical Stimulation Goals Pain                  PT Short Term Goals - 11/21/16 1349      PT SHORT TERM GOAL #1   Title STG's=LTG's.           PT Long Term Goals - 12/23/16 0915      PT LONG TERM GOAL #1   Title Independent with a HEP.    Time 8   Period Weeks   Status On-going     PT LONG TERM GOAL #2   Title Active right shoulder flexion to 145 degrees so the patient can easily reach overhead   Time 8   Period Weeks   Status On-going  AROM 95 degrees 12/23/16     PT LONG TERM GOAL #3   Title Active ER to 80 degrees+ to allow for easily donning/doffing of apparel   Time 8   Period Weeks   Status On-going     PT LONG TERM GOAL #4   Title Increase right shoulder and right LE strength to a solid 4+/5 to increase stability for performance of functional activities   Time 8   Period Weeks   Status On-going     PT LONG TERM GOAL #5   Title Stand 20 minutes with pain not > 3/10.   Time 8   Period Weeks   Status On-going     PT LONG TERM GOAL #6   Title Walk a community distance with pain not > 3/10.   Time 8   Period Weeks   Status On-going     PT LONG TERM GOAL #7   Title Sleep undisturbed 6 hours.   Time 8   Period Weeks   Status On-going  only 2 hours per patient 12/23/16               Plan - 01/01/17 0946    Clinical Impression Statement Patient tolerated treatment well today. Patient able to progress with posture focus exercises today. Patient has ongoing discomfort in right shoulder and mid back with activity. Patient feelslittle improvement overall"good and bad days", 20% improvement overall. Patient improved AROM for right shoulder flexion today. Patient unable to meet any further goals due to pain deficts.    PT Frequency 2x / week   PT Duration 8 weeks   PT Treatment/Interventions ADLs/Self Care Home Management;Cryotherapy;Electrical Stimulation;Ultrasound;Moist Heat;Functional mobility training;Therapeutic activities;Therapeutic exercise;Neuromuscular re-education;Patient/family education;Passive range of motion;Vasopneumatic Device;Manual techniques   PT Next Visit Plan Continue core/lumbar strengthening as well as shoulder ROM/strengthening with modalities PRN for pain per MPT POC. monitor  vitals   Consulted and Agree with Plan of Care Patient      Patient will benefit from skilled therapeutic intervention in order  to improve the following deficits and impairments:  Pain, Decreased strength, Impaired UE functional use, Decreased range of motion, Postural dysfunction  Visit Diagnosis: Chronic right-sided low back pain without sciatica  Chronic right shoulder pain  Abnormal posture  Muscle weakness (generalized)     Problem List Patient Active Problem List   Diagnosis Date Noted  . HTN (hypertension) 02/19/2016  . Post-nasal drip 05/15/2015  . Postoperative abdominal pain 12/07/2014  . Voiding dysfunction 10/07/2014  . SUI (stress urinary incontinence, female) 10/07/2014  . Osteoporosis with fracture 04/06/2014  . Impacted cerumen of both ears 08/20/2013  . Encounter for therapeutic drug monitoring 08/20/2013  . DOE (dyspnea on exertion) 07/23/2013  . Helicobacter positive gastritis 06/21/2013  . Orthostatic hypotension 06/10/2013  . UTI (lower urinary tract infection) 05/06/2013  . Neck mass 02/19/2013  . Neck pain, bilateral 02/19/2013  . Right groin mass 11/08/2011  . Low back pain 11/08/2011  . Lipoma of neck 06/21/2011  . Memory loss 06/21/2011  . OVERACTIVE BLADDER 08/21/2010  . Long term current use of anticoagulant 07/25/2010  . Osteoarthritis 01/18/2010  . KNEE PAIN 01/18/2010  . FOOT PAIN 01/18/2010  . Hyperlipidemia with target LDL less than 100 04/20/2009  . GERD 04/17/2009  . MITRAL REGURGITATION, MILD 08/30/2008  . NICM (nonischemic cardiomyopathy) ([place]) 08/30/2008  . Congestive heart failure ([place]) 04/13/2007  . DEPRESSION 04/10/2007  . DISEASE, MITRAL VALVE NEC/NOS 04/10/2007  . Atrial fibrillation ([place]) 04/10/2007  . STASIS ULCER 04/10/2007  . VENOUS INSUFFICIENCY, LEGS 04/10/2007    Retta Pitcher P, PTA 01/01/2017, 9:59 AM  [facility] [place], [place],  [address] Phone: [phone]   Fax:  [phone]  Name: MARTINE BLEECKER MRN: [id number] Date of Birth: Oct 28, 1940  PHYSICAL THERAPY DISCHARGE SUMMARY  Visits from Start of Care: 11.  Current functional level related to goals / functional outcomes: See above.   Remaining deficits: Goals unmet.   Education / Equipment: HEP. Plan: Patient agrees to discharge.  Patient goals were not met. Patient is being discharged due to not returning since the last visit.  ?????         Mali Applegate MPT

## 2017-01-02 ENCOUNTER — Encounter: Payer: Self-pay | Admitting: Nurse Practitioner

## 2017-01-02 ENCOUNTER — Ambulatory Visit (INDEPENDENT_AMBULATORY_CARE_PROVIDER_SITE_OTHER): Payer: Medicare Other | Admitting: Pharmacist

## 2017-01-02 ENCOUNTER — Ambulatory Visit (INDEPENDENT_AMBULATORY_CARE_PROVIDER_SITE_OTHER): Payer: Medicare Other | Admitting: Nurse Practitioner

## 2017-01-02 VITALS — BP 126/74 | HR 82 | Temp 97.1°F | Ht 70.0 in | Wt 180.0 lb

## 2017-01-02 DIAGNOSIS — I481 Persistent atrial fibrillation: Secondary | ICD-10-CM

## 2017-01-02 DIAGNOSIS — N644 Mastodynia: Secondary | ICD-10-CM

## 2017-01-02 DIAGNOSIS — Z5181 Encounter for therapeutic drug level monitoring: Secondary | ICD-10-CM | POA: Diagnosis not present

## 2017-01-02 DIAGNOSIS — I4819 Other persistent atrial fibrillation: Secondary | ICD-10-CM

## 2017-01-02 LAB — COAGUCHEK XS/INR WAIVED
INR: 3.7 — ABNORMAL HIGH (ref 0.9–1.1)
Prothrombin Time: 43.9 s

## 2017-01-02 NOTE — Patient Instructions (Signed)

## 2017-01-02 NOTE — Progress Notes (Signed)
   Subjective:    Patient ID: Virginia Young, female    DOB: March 01, 1941, 76 y.o.   MRN: [id number]  HPI Patient in the office for right side breast pain and swelling for 5 weeks.  She saw Dr. Evette Doffing on 12/13/16 and was given Keflex.  Patient states the breast was less painful while on the Keflex, but has not improved otherwise.  She states her breast is very painful and she feels like it is sticking out to the side.  She says it feels like an ache and that her breast swells up and is very painful.  She has tried Tylenol as needed for pain and that helps some, but the pain returns again.   Review of Systems  Constitutional: Positive for fatigue. Negative for activity change and appetite change.  Respiratory: Negative for chest tightness, shortness of breath and wheezing.   Cardiovascular: Negative for chest pain and palpitations.  Gastrointestinal: Negative for abdominal pain.  All other systems reviewed and are negative.      Objective:   Physical Exam  Constitutional: She is oriented to person, place, and time. She appears well-developed and well-nourished. No distress.  HENT:  Head: Normocephalic.  Neck: Normal range of motion. Neck supple.  Cardiovascular: Normal rate and intact distal pulses.   Pulmonary/Chest: Effort normal and breath sounds normal. Right breast exhibits tenderness (diffuse-  patient can hardly stand light palpation to breast). Right breast exhibits no inverted nipple, no mass, no nipple discharge and no skin change. Left breast exhibits no inverted nipple, no mass, no nipple discharge, no skin change and no tenderness. Breasts are symmetrical.  Abdominal: Soft.  Musculoskeletal: Normal range of motion.  Neurological: She is alert and oriented to person, place, and time.  Skin: Skin is warm and dry.  Psychiatric: She has a normal mood and affect. Her behavior is normal. Judgment and thought content normal.   BP 126/74   Pulse 82   Temp (!) 97.1 F (36.2 C)  (Oral)   Ht 5\' 10"  (1.778 m)   Wt 180 lb (81.6 kg)   BMI 25.83 kg/m      Assessment & Plan:   1. Breast pain, right    Orders Placed This Encounter  Procedures  . US BREAST COMPLETE UNI RIGHT INC AXILLA    Standing Status:   Future    Standing Expiration Date:   03/05/2018    Order Specific Question:   Reason for Exam (SYMPTOM  OR DIAGNOSIS REQUIRED)    Answer:   right breast pain    Order Specific Question:   Preferred imaging location?    Answer:   [facility]   RTO prn  Mary-Margaret Hassell Done, [place]

## 2017-01-02 NOTE — Patient Instructions (Signed)
Anticoagulation Warfarin Dose Instructions as of 01/02/2017      Virginia Young Tue Wed Thu Fri Sat   New Dose 2.5 mg 5 mg 2.5 mg 5 mg 2.5 mg 5 mg 2.5 mg    Description   No warfarin for 1 dose.  Then continue current warfarin 5mg  - take 1 tablet mondays, wednesdays and fridays.  Take 1/2 tablet all other days. C  INR was 3.7 today (goal is 2.0 to 3.0) - too thin - likely related to recent antibiotic therapy

## 2017-01-03 DIAGNOSIS — M25511 Pain in right shoulder: Secondary | ICD-10-CM | POA: Diagnosis not present

## 2017-01-06 ENCOUNTER — Encounter: Payer: Medicare Other | Admitting: Physical Therapy

## 2017-01-08 ENCOUNTER — Encounter: Payer: Medicare Other | Admitting: Physical Therapy

## 2017-01-08 DIAGNOSIS — M170 Bilateral primary osteoarthritis of knee: Secondary | ICD-10-CM | POA: Diagnosis not present

## 2017-01-08 DIAGNOSIS — M19011 Primary osteoarthritis, right shoulder: Secondary | ICD-10-CM | POA: Diagnosis not present

## 2017-01-08 DIAGNOSIS — M25511 Pain in right shoulder: Secondary | ICD-10-CM | POA: Diagnosis not present

## 2017-01-09 ENCOUNTER — Other Ambulatory Visit: Payer: Self-pay | Admitting: Pediatrics

## 2017-01-09 DIAGNOSIS — M25511 Pain in right shoulder: Principal | ICD-10-CM

## 2017-01-09 DIAGNOSIS — G8929 Other chronic pain: Secondary | ICD-10-CM

## 2017-01-10 ENCOUNTER — Ambulatory Visit (INDEPENDENT_AMBULATORY_CARE_PROVIDER_SITE_OTHER): Payer: Medicare Other | Admitting: Pharmacist

## 2017-01-10 DIAGNOSIS — I4819 Other persistent atrial fibrillation: Secondary | ICD-10-CM

## 2017-01-10 DIAGNOSIS — Z5181 Encounter for therapeutic drug level monitoring: Secondary | ICD-10-CM

## 2017-01-10 DIAGNOSIS — I481 Persistent atrial fibrillation: Secondary | ICD-10-CM

## 2017-01-10 NOTE — Patient Instructions (Signed)
Anticoagulation Warfarin Dose Instructions as of 01/10/2017      Virginia Young Tue Wed Thu Fri Sat   New Dose 2.5 mg 5 mg 2.5 mg 5 mg 2.5 mg 5 mg 2.5 mg    Description   Continue current warfarin 5mg  - take 1 tablet mondays, wednesdays and fridays.  Take 1/2 tablet all other days.   INR was 2.0 today (goal is 2.0 to 3.0)

## 2017-01-13 LAB — COAGUCHEK XS/INR WAIVED
INR: 2 — ABNORMAL HIGH (ref 0.9–1.1)
Prothrombin Time: 23.5 s

## 2017-01-15 ENCOUNTER — Telehealth: Payer: Self-pay

## 2017-01-15 NOTE — Telephone Encounter (Signed)
Why did you order a mammogram on patient   She already had one

## 2017-01-15 NOTE — Telephone Encounter (Signed)
I didn't, MMM ordered the recent mammogram. Pt did just have one. If she is still having breast pain she needs appt to be seen.

## 2017-01-16 ENCOUNTER — Ambulatory Visit (INDEPENDENT_AMBULATORY_CARE_PROVIDER_SITE_OTHER): Payer: Medicare Other | Admitting: Pediatrics

## 2017-01-16 ENCOUNTER — Encounter: Payer: Self-pay | Admitting: Pediatrics

## 2017-01-16 ENCOUNTER — Encounter (INDEPENDENT_AMBULATORY_CARE_PROVIDER_SITE_OTHER): Payer: Self-pay

## 2017-01-16 ENCOUNTER — Ambulatory Visit (INDEPENDENT_AMBULATORY_CARE_PROVIDER_SITE_OTHER): Payer: Medicare Other

## 2017-01-16 VITALS — BP 132/89 | HR 100 | Temp 97.2°F | Ht 70.0 in | Wt 181.6 lb

## 2017-01-16 DIAGNOSIS — R1031 Right lower quadrant pain: Secondary | ICD-10-CM | POA: Diagnosis not present

## 2017-01-16 DIAGNOSIS — N61 Mastitis without abscess: Secondary | ICD-10-CM | POA: Diagnosis not present

## 2017-01-16 DIAGNOSIS — R0781 Pleurodynia: Secondary | ICD-10-CM

## 2017-01-16 DIAGNOSIS — B372 Candidiasis of skin and nail: Secondary | ICD-10-CM

## 2017-01-16 MED ORDER — NYSTATIN 100000 UNIT/GM EX OINT: 1.0000 "application " | TOPICAL_OINTMENT | Freq: Two times a day (BID) | CUTANEOUS | 0 refills | Status: DC

## 2017-01-16 MED ORDER — CLINDAMYCIN HCL 300 MG PO CAPS: 300.0000 mg | ORAL_CAPSULE | Freq: Three times a day (TID) | ORAL | 0 refills | Status: DC

## 2017-01-16 NOTE — Patient Instructions (Signed)
Use nystatin ointment on any red irritated areas under breast Try to keep skin as dry as possible, with paper towel or cotton cloth if not wearing a bra

## 2017-01-16 NOTE — Progress Notes (Signed)
  Subjective:   Patient ID: Virginia Young, female    DOB: [birth year], 76 y.o.   MRN: [id number] CC: Follow-up breast pain HPI: Virginia Young is a 76 y.o. female presenting for Follow-up  Ongoing pain R breast and side Says it improved when she was on keflex 4 weeks ago for a few days, then got worse again after antibiotics finished Had normal mammogram and diagnostic mammogram done when started on abx as above  Red rash under R breast, itchy  Continues to have pain in R upper arm off and on, R side pain Thinks some of R sided pain may have started when she fell several months ago Has been followed by neurology for R arm pain, now in physical therapy with some improvement in symptoms R lower abd sometimes with pain Ongoing off and on for months Had a hernia repair in the area months ago Normal stooling, every day or every other day when taking linzess  Normal appetite No fevers No weight changes  Relevant past medical, surgical, family and social history reviewed. Allergies and medications reviewed and updated. History  Smoking Status  . Never Smoker  Smokeless Tobacco  . Never Used   ROS: Per HPI   Objective:    BP 132/89   Pulse 100   Temp (!) 97.2 F (36.2 C) (Oral)   Ht 5\' 10"  (1.778 m)   Wt 181 lb 9.6 oz (82.4 kg)   BMI 26.06 kg/m   Wt Readings from Last 3 Encounters:  01/16/17 181 lb 9.6 oz (82.4 kg)  01/02/17 180 lb (81.6 kg)  12/13/16 186 lb 6.4 oz (84.6 kg)    Gen: NAD, alert, cooperative with exam, NCAT EYES: EOMI, no conjunctival injection, or no icterus ENT:  OP without erythema LYMPH: no cervical LAD CV: NRRR, normal S1/S2, no murmur, distal pulses 2+ b/l Resp: CTABL, no wheezes, normal WOB Abd: +BS, soft, ttp sometimes RLQ with deep palpation during exam, sometimes not, no guarding, no rebound, no masses, non-distended. No peritoneal signs Ext: No edema, warm Neuro: Alert and oriented, wide based gait MSK: ttp R upper arm with palpation  along humerus, no rash, normal ROM R shoulder ttp along b/l sternal border with palpation, ttp posterior R ribs lateral to scaula and along axillary line Does have curvature of the spine Skin: Red papular rash under R breast consistent with candidal rash Breast: R breast very tender with palpation outer quadrants No induration or fluctuance, slightly more pink compared to L side  Assessment & Plan:  Circe was seen today for follow-up multiple med problems.  Diagnoses and all orders for this visit:  Rib pain on right side -     DG Chest 2 View; Future  Cellulitis of right breast Will treat with broader spectrum abx again, normal mammogram -     clindamycin (CLEOCIN) 300 MG capsule; Take 1 capsule (300 mg total) by mouth 3 (three) times daily.  Candidal intertrigo -     nystatin ointment (MYCOSTATIN); Apply 1 application topically 2 (two) times daily.  Right lower quadrant abdominal pain Pain around previous hernia scar No masses palpated today Cannot reproduce the pain she is having, is sometimes tender in RLQ, discussed options including imaging for further work up, pt wants to wait  Follow up plan: Return in about 4 weeks (around 02/13/2017). Assunta Found, MD [place]

## 2017-01-21 ENCOUNTER — Ambulatory Visit (HOSPITAL_COMMUNITY): Payer: Medicare Other

## 2017-01-25 ENCOUNTER — Other Ambulatory Visit: Payer: Self-pay | Admitting: Family Medicine

## 2017-02-02 ENCOUNTER — Other Ambulatory Visit: Payer: Self-pay | Admitting: Family Medicine

## 2017-02-13 DIAGNOSIS — M170 Bilateral primary osteoarthritis of knee: Secondary | ICD-10-CM | POA: Diagnosis not present

## 2017-02-14 ENCOUNTER — Ambulatory Visit (INDEPENDENT_AMBULATORY_CARE_PROVIDER_SITE_OTHER): Payer: Medicare Other | Admitting: Pediatrics

## 2017-02-14 ENCOUNTER — Encounter: Payer: Self-pay | Admitting: Pediatrics

## 2017-02-14 ENCOUNTER — Encounter: Payer: Self-pay | Admitting: Pharmacist Clinician (PhC)/ Clinical Pharmacy Specialist

## 2017-02-14 VITALS — BP 133/84 | HR 62 | Temp 96.8°F | Ht 70.0 in | Wt 181.0 lb

## 2017-02-14 DIAGNOSIS — I4819 Other persistent atrial fibrillation: Secondary | ICD-10-CM

## 2017-02-14 DIAGNOSIS — Z5181 Encounter for therapeutic drug level monitoring: Secondary | ICD-10-CM | POA: Diagnosis not present

## 2017-02-14 DIAGNOSIS — M25511 Pain in right shoulder: Secondary | ICD-10-CM

## 2017-02-14 DIAGNOSIS — G8929 Other chronic pain: Secondary | ICD-10-CM

## 2017-02-14 DIAGNOSIS — I481 Persistent atrial fibrillation: Secondary | ICD-10-CM | POA: Diagnosis not present

## 2017-02-14 LAB — COAGUCHEK XS/INR WAIVED
INR: 3.2 — ABNORMAL HIGH (ref 0.9–1.1)
Prothrombin Time: 38.3 s

## 2017-02-14 MED ORDER — LINACLOTIDE 72 MCG PO CAPS
72.0000 ug | ORAL_CAPSULE | Freq: Every day | ORAL | 1 refills | Status: DC
Start: 2017-02-14 — End: 2017-03-03

## 2017-02-14 NOTE — Patient Instructions (Signed)
Anticoagulation Warfarin Dose Instructions as of 02/14/2017      Dorene Grebe Tue Wed Thu Fri Sat   New Dose 2.5 mg 5 mg 2.5 mg 5 mg 2.5 mg 5 mg 2.5 mg    Description   Continue current warfarin 5mg  - take 1 tablet mondays, wednesdays and fridays.  Take 1/2 tablet all other days.   INR was 3.2 today (goal is 2.0 to 3.0)

## 2017-02-14 NOTE — Progress Notes (Signed)
  Subjective:   Patient ID: Virginia Young, female    DOB: Nov 14, 1940, 76 y.o.   MRN: [id number] CC: Coagulation Disorder and Follow-up  HPI: Virginia Young is a 75 y.o. female presenting for Coagulation Disorder and Follow-up  Afib: on amiodarone and warfarin for anticoagulation Recently on antibiotics No bleeding Not missed nay doses  Continues to be bothered by R shoulder, sometimes pain R side of rib cage Has appt to see orthopedics next week Got a steroid injection in shoulder that she says helped some Has had numbness and pain in R hand and arm throughout for past few months Has had MRI brain without cause, MRI of neck with some degenerative changes, foraminal stenosis at multiple levels, was followed by EMG with neurology normal nerves and muscle Some days bothers her more than others Suspected that arthritis in shoulder contributing to discomfort in arm No fevers  MMSE - Mini Mental State Exam 02/14/2017 01/15/2016 01/13/2015  Orientation to time 5 5 2   Orientation to Place 5 5 4   Registration 3 3 3   Attention/ Calculation 0 2 4  Recall 2 0 3  Language- name 2 objects 2 2 1   Language- repeat 1 1 1   Language- follow 3 step command 3 3 3   Language- read & follow direction 1 1 1   Write a sentence 1 1 1   Copy design 0 0 0  Total score 23 23 23      Relevant past medical, surgical, family and social history reviewed. Allergies and medications reviewed and updated. History  Smoking Status  . Never Smoker  Smokeless Tobacco  . Never Used   ROS: Per HPI   Objective:    BP 133/84   Pulse 62   Temp (!) 96.8 F (36 C) (Oral)   Ht 5\' 10"  (1.778 m)   Wt 181 lb (82.1 kg)   BMI 25.97 kg/m   Wt Readings from Last 3 Encounters:  02/14/17 181 lb (82.1 kg)  01/16/17 181 lb 9.6 oz (82.4 kg)  01/02/17 180 lb (81.6 kg)    Gen: NAD, alert, cooperative with exam, NCAT EYES: EOMI, no conjunctival injection, or no icterus LYMPH: no cervical LAD CV: NRRR, normal S1/S2,  no murmur, distal pulses 2+ b/l Resp: CTABL, no wheezes, normal WOB Abd: +BS, soft, NTND. no guarding or organomegaly Ext: No edema, warm Neuro: Alert and oriented, sensation to touch R arm different than L MSK: ttp AC joint and anterior R shoulder Able to raise both arms to apprx 120 degrees overhead No redness  Assessment & Plan:  Virginia Young was seen today for coagulation disorder and follow-up.  Diagnoses and all orders for this visit:  Persistent atrial fibrillation (HCC) INR 3.2, slightly elevated, recently on antibiotics, now off Repeat 2 weeks -     CoaguChek XS/INR Waived  Encounter for therapeutic drug monitoring  Chronic right shoulder pain Follow up with ortho  Other orders -     linaclotide (LINZESS) 72 MCG capsule; Take 1 capsule (72 mcg total) by mouth daily before breakfast.   Follow up plan: Return in about 2 weeks (around 02/28/2017). Assunta Found, MD [place]

## 2017-02-20 DIAGNOSIS — R200 Anesthesia of skin: Secondary | ICD-10-CM | POA: Diagnosis not present

## 2017-02-20 DIAGNOSIS — M170 Bilateral primary osteoarthritis of knee: Secondary | ICD-10-CM | POA: Diagnosis not present

## 2017-02-20 DIAGNOSIS — M19011 Primary osteoarthritis, right shoulder: Secondary | ICD-10-CM | POA: Diagnosis not present

## 2017-02-20 DIAGNOSIS — R202 Paresthesia of skin: Secondary | ICD-10-CM | POA: Diagnosis not present

## 2017-02-26 ENCOUNTER — Other Ambulatory Visit: Payer: Self-pay | Admitting: Nurse Practitioner

## 2017-02-26 DIAGNOSIS — R42 Dizziness and giddiness: Secondary | ICD-10-CM

## 2017-02-26 DIAGNOSIS — M170 Bilateral primary osteoarthritis of knee: Secondary | ICD-10-CM | POA: Diagnosis not present

## 2017-02-27 NOTE — Telephone Encounter (Signed)
Was this requested by pt or by pharmacy? If by pt, OK to send in 30 pills.

## 2017-03-01 ENCOUNTER — Other Ambulatory Visit: Payer: Self-pay | Admitting: Pediatrics

## 2017-03-01 DIAGNOSIS — B372 Candidiasis of skin and nail: Secondary | ICD-10-CM

## 2017-03-03 ENCOUNTER — Telehealth: Payer: Self-pay | Admitting: *Deleted

## 2017-03-03 ENCOUNTER — Telehealth: Payer: Self-pay | Admitting: Pediatrics

## 2017-03-03 ENCOUNTER — Encounter: Payer: Self-pay | Admitting: Pediatrics

## 2017-03-03 ENCOUNTER — Ambulatory Visit (INDEPENDENT_AMBULATORY_CARE_PROVIDER_SITE_OTHER): Payer: Medicare Other | Admitting: Pediatrics

## 2017-03-03 VITALS — BP 173/104 | HR 87 | Temp 97.6°F | Ht 70.0 in | Wt 183.2 lb

## 2017-03-03 DIAGNOSIS — I482 Chronic atrial fibrillation, unspecified: Secondary | ICD-10-CM

## 2017-03-03 DIAGNOSIS — R739 Hyperglycemia, unspecified: Secondary | ICD-10-CM | POA: Diagnosis not present

## 2017-03-03 DIAGNOSIS — Z5181 Encounter for therapeutic drug level monitoring: Secondary | ICD-10-CM | POA: Diagnosis not present

## 2017-03-03 DIAGNOSIS — K5900 Constipation, unspecified: Secondary | ICD-10-CM

## 2017-03-03 DIAGNOSIS — I428 Other cardiomyopathies: Secondary | ICD-10-CM | POA: Diagnosis not present

## 2017-03-03 DIAGNOSIS — G8929 Other chronic pain: Secondary | ICD-10-CM

## 2017-03-03 DIAGNOSIS — Z7901 Long term (current) use of anticoagulants: Secondary | ICD-10-CM | POA: Diagnosis not present

## 2017-03-03 DIAGNOSIS — M25511 Pain in right shoulder: Secondary | ICD-10-CM | POA: Diagnosis not present

## 2017-03-03 LAB — COAGUCHEK XS/INR WAIVED
INR: 3.1 — ABNORMAL HIGH (ref 0.9–1.1)
Prothrombin Time: 37.2 s

## 2017-03-03 MED ORDER — DICLOFENAC SODIUM 1 % TD GEL: 2.0000 g | Freq: Four times a day (QID) | TRANSDERMAL | 2 refills | Status: DC

## 2017-03-03 MED ORDER — METOPROLOL SUCCINATE ER 25 MG PO TB24: 12.5000 mg | ORAL_TABLET | Freq: Every day | ORAL | 3 refills | Status: DC

## 2017-03-03 MED ORDER — DONEPEZIL HCL 5 MG PO TABS: 5.0000 mg | ORAL_TABLET | Freq: Every day | ORAL | 3 refills | Status: DC

## 2017-03-03 MED ORDER — WARFARIN SODIUM 5 MG PO TABS: ORAL_TABLET | ORAL | 1 refills | Status: DC

## 2017-03-03 MED ORDER — AMIODARONE HCL 200 MG PO TABS: ORAL_TABLET | ORAL | 1 refills | Status: DC

## 2017-03-03 MED ORDER — DOCUSATE SODIUM 100 MG PO CAPS: 100.0000 mg | ORAL_CAPSULE | Freq: Two times a day (BID) | ORAL | 2 refills | Status: DC

## 2017-03-03 MED ORDER — LINACLOTIDE 72 MCG PO CAPS: 72.0000 ug | ORAL_CAPSULE | Freq: Every day | ORAL | 1 refills | Status: DC

## 2017-03-03 NOTE — Telephone Encounter (Signed)
Can you let pt know--due to interaction with her amiodarone we are not going to do aricept for now.

## 2017-03-03 NOTE — Telephone Encounter (Signed)
[facility] called stating that there is a drug interaction between amiodarone and coumadin

## 2017-03-03 NOTE — Progress Notes (Signed)
  Subjective:   Patient ID: Virginia Young, female    DOB: 04-21-1941, 76 y.o.   MRN: [id number] CC: Follow-up (INR) and Arm Pain (Right)  HPI: Virginia Young is a 76 y.o. female presenting for Follow-up (INR) and Arm Pain (Right)  Arm pain: rec to have shoulder replacement by ortho Pt thinking about it, has f/u with them Using voltaren gel, helping some Got two shoulder injections last week, thinks helped some  Energy levels down Feels draggy at times No SOB, no CP  No bleeding Taking warfarin regularly  HTN: no CP, no SOB Taking meds  Says when she checks at home BP is much lower Declines BP to be rechecked today  Relevant past medical, surgical, family and social history reviewed. Allergies and medications reviewed and updated. History  Smoking Status  . Never Smoker  Smokeless Tobacco  . Never Used   ROS: Per HPI   Objective:    BP (!) 173/104   Pulse 87   Temp 97.6 F (36.4 C) (Oral)   Ht _0  (1.778 m)   Wt 183 lb 3.2 oz (83.1 kg)   BMI 26.29 kg/m   Wt Readings from Last 3 Encounters:  03/03/17 183 lb 3.2 oz (83.1 kg)  02/14/17 181 lb (82.1 kg)  01/16/17 181 lb 9.6 oz (82.4 kg)    Gen: NAD, alert, cooperative with exam, NCAT EYES: EOMI, no conjunctival injection, or no icterus ENT: OP without erythema CV: NRRR, normal S1/S2, no murmur, distal pulses 2+ b/l Resp: CTABL, no wheezes, normal WOB Abd: +BS, soft, NTND.  Ext: No edema, warm Neuro: Alert and oriented MSK: no redness R shoulder, abduct up to 110 degrees  Assessment & Plan:  Virginia Young was seen today for follow-up and arm pain.  Diagnoses and all orders for this visit:  Chronic atrial fibrillation (HCC) INR 3.1, see anticoag documentation -     CoaguChek XS/INR Waived -     amiodarone (PACERONE) 200 MG tablet; TAKE 1 TABLET (200 MG TOTAL) BY MOUTH DAILY. -     metoprolol succinate (TOPROL-XL) 25 MG 24 hr tablet; Take 0.5 tablets (12.5 mg total) by mouth daily. -      CMP14+EGFR  Long term current use of anticoagulant -     CoaguChek XS/INR Waived -     warfarin (COUMADIN) 5 MG tablet; Take 2.5-40m as prescribed  Encounter for therapeutic drug monitoring -     CMP14+EGFR -     TSH  Chronic right shoulder pain S/p shoulder inj with ortho, thinking about shoulder replacement -     diclofenac sodium (VOLTAREN) 1 % GEL; Apply 2 g topically 4 (four) times daily.  Hyperglycemia -     Bayer DCA Hb A1c Waived  Constipation, unspecified constipation type -     docusate sodium ([facility] STOOL SOFTENER) 100 MG capsule; Take 1 capsule (100 mg total) by mouth 2 (two) times daily. -     linaclotide (LINZESS) 72 MCG capsule; Take 1 capsule (72 mcg total) by mouth daily before breakfast.  Follow up plan: Return in about 2 weeks (around 03/17/2017) for INR check. CAssunta Found MD [place]

## 2017-03-03 NOTE — Telephone Encounter (Signed)
pt aware 

## 2017-03-03 NOTE — Patient Instructions (Signed)
Anticoagulation Warfarin Dose Instructions as of 03/03/2017      Dorene Grebe Tue Wed Thu Fri Sat   New Dose 2.5 mg 2.5 mg 2.5 mg 5 mg 2.5 mg 5 mg 2.5 mg    Description   Change warfarin dosing of 5mg  tablets - take 1 tablet wednesdays and fridays.  Take 1/2 tablet all other days.   INR was 3.1 today (goal is 2.0 to 3.0)

## 2017-03-04 ENCOUNTER — Telehealth: Payer: Self-pay | Admitting: Pediatrics

## 2017-03-11 NOTE — Telephone Encounter (Signed)
Faxed /lat °

## 2017-03-18 ENCOUNTER — Encounter: Payer: Self-pay | Admitting: Pediatrics

## 2017-03-18 ENCOUNTER — Ambulatory Visit (INDEPENDENT_AMBULATORY_CARE_PROVIDER_SITE_OTHER): Payer: Medicare Other | Admitting: Pediatrics

## 2017-03-18 ENCOUNTER — Ambulatory Visit (INDEPENDENT_AMBULATORY_CARE_PROVIDER_SITE_OTHER): Payer: Medicare Other

## 2017-03-18 VITALS — Temp 97.0°F | Ht 70.0 in | Wt 178.4 lb

## 2017-03-18 DIAGNOSIS — R1030 Lower abdominal pain, unspecified: Secondary | ICD-10-CM

## 2017-03-18 DIAGNOSIS — K5900 Constipation, unspecified: Secondary | ICD-10-CM

## 2017-03-18 DIAGNOSIS — M8949 Other hypertrophic osteoarthropathy, multiple sites: Secondary | ICD-10-CM

## 2017-03-18 DIAGNOSIS — M150 Primary generalized (osteo)arthritis: Secondary | ICD-10-CM | POA: Diagnosis not present

## 2017-03-18 DIAGNOSIS — Z780 Asymptomatic menopausal state: Secondary | ICD-10-CM

## 2017-03-18 DIAGNOSIS — M159 Polyosteoarthritis, unspecified: Secondary | ICD-10-CM

## 2017-03-18 DIAGNOSIS — Z23 Encounter for immunization: Secondary | ICD-10-CM

## 2017-03-18 DIAGNOSIS — R110 Nausea: Secondary | ICD-10-CM

## 2017-03-18 DIAGNOSIS — R0609 Other forms of dyspnea: Secondary | ICD-10-CM | POA: Diagnosis not present

## 2017-03-18 DIAGNOSIS — R0600 Dyspnea, unspecified: Secondary | ICD-10-CM

## 2017-03-18 DIAGNOSIS — I4891 Unspecified atrial fibrillation: Secondary | ICD-10-CM

## 2017-03-18 DIAGNOSIS — Z5181 Encounter for therapeutic drug level monitoring: Secondary | ICD-10-CM

## 2017-03-18 DIAGNOSIS — N3090 Cystitis, unspecified without hematuria: Secondary | ICD-10-CM | POA: Diagnosis not present

## 2017-03-18 LAB — URINALYSIS, COMPLETE
Bilirubin, UA: NEGATIVE
Glucose, UA: NEGATIVE
Ketones, UA: NEGATIVE
Nitrite, UA: NEGATIVE
Specific Gravity, UA: 1.015 (ref 1.005–1.030)
Urobilinogen, Ur: 0.2 mg/dL (ref 0.2–1.0)
pH, UA: 7 (ref 5.0–7.5)

## 2017-03-18 LAB — MICROSCOPIC EXAMINATION
Renal Epithel, UA: NONE SEEN /hpf
WBC, UA: >30 /hpf — AB (ref 0–?)

## 2017-03-18 LAB — COAGUCHEK XS/INR WAIVED
INR: 2.5 — ABNORMAL HIGH (ref 0.9–1.1)
Prothrombin Time: 29.5 s

## 2017-03-18 MED ORDER — LINACLOTIDE 145 MCG PO CAPS: 145.0000 ug | ORAL_CAPSULE | Freq: Every day | ORAL | 1 refills | Status: DC

## 2017-03-18 MED ORDER — NITROFURANTOIN MONOHYD MACRO 100 MG PO CAPS: 100.0000 mg | ORAL_CAPSULE | Freq: Two times a day (BID) | ORAL | 0 refills | Status: AC

## 2017-03-18 NOTE — Addendum Note (Signed)
Addended by: Eustaquio Maize on: 03/18/2017 02:32 PM   Modules accepted: Orders

## 2017-03-18 NOTE — Progress Notes (Addendum)
Subjective:   Patient ID: Virginia Young, female    DOB: 1941/06/18, 76 y.o.   MRN: [id number] CC: Follow-up (INR) and Nausea  HPI: Virginia Young is a 76 y.o. female presenting for Follow-up (INR) and Nausea  Gets nausea feeling a few times a week Could be anytime during the day Food doesn't seem to help or make it worse Doesn't come on with exertion Has thrown up a couple of times over the past few weeks Does not think she has any reflux  Having stools every 2-3 days Some pain off and on RLQ, lower abd, periumbilical Says her appetite has been good No early satiety Not avoiding food because of above symptoms, not happening every day Has dysuria at times, not right now Drinking water regularly Not taking fiber supplements but eating fruit daily  Has to stop to rest on mail boxes when walking around the block Has had dyspnea on exertion for past few months No racing heart rate, no palpitations Sometimes has chest pain, not with exertion Overdue for follow up with cardiology  Says she may be getting shoulder surgery in the future, has not had a date set for surgery But ortho wants to try to do everything they can to avoid it if necessary She asks about a form that orthopedics sent over   Says it is fine to discuss above with her daughter  Relevant past medical, surgical, family and social history reviewed. Allergies and medications reviewed and updated. History  Smoking Status  . Never Smoker  Smokeless Tobacco  . Never Used   ROS: Per HPI   Objective:    Temp (!) 97 F (36.1 C) (Oral)   Ht 5' 10"  (1.778 m)   Wt 178 lb 6.4 oz (80.9 kg)   BMI 25.60 kg/m   Wt Readings from Last 3 Encounters:  03/18/17 178 lb 6.4 oz (80.9 kg)  03/03/17 183 lb 3.2 oz (83.1 kg)  02/14/17 181 lb (82.1 kg)   Gen: NAD, alert, cooperative with exam, NCAT EYES: EOMI, no conjunctival injection, or no icterus ENT:   OP without erythema LYMPH: no cervical LAD CV: irregular  rhythm, normal S1/S2, no murmur, distal pulses 2+ b/l Resp: CTABL, no wheezes, normal WOB Abd: +BS, soft, mildly tender with palpation throughout, especially lower abd. No rebound, no guarding, no masses palpated. ND. Ext: No edema, warm Neuro: Alert and oriented  Assessment & Plan:  Virginia Young was seen today for follow-up and nausea.  Diagnoses and all orders for this visit:  Atrial fibrillation, unspecified type ([place]) INR 2.5, cont same dosing -     CoaguChek XS/INR Waived  Primary osteoarthritis involving multiple joints Can try aspercream R shoulder Following with ortho  Constipation, unspecified constipation type Increase linzess dose, stools irregular may be contributing to abd symptoms -     linaclotide (LINZESS) 145 MCG CAPS capsule; Take 1 capsule (145 mcg total) by mouth daily before breakfast. -     DG Abd 1 View; Future  Encounter for therapeutic drug monitoring On amiodarone, due for TSH check INR today at goal -     TSH  DOE (dyspnea on exertion) Was getting work up through cardiology but has not followed up  Pt says not getting any worse With possible upcoming shoulder surgery will refer pt back to cardiology  Nausea Intermittent, not daily Check labs, abd xr No fevers Try increased medicines for constipation Will discuss with daughter as well with pt's permission -     CBC  with Differential/Platelet -     CMP14+EGFR -     Urinalysis, Complete -     Urine Culture  Lower abdominal pain -     CBC with Differential/Platelet -     CMP14+EGFR -     Urinalysis, Complete -     Urine Culture -     DG Abd 1 View; Future  Post-menopausal -     DG [facility] DEXA  Need for immunization against influenza -     Flu Vaccine QUAD 36+ mos IM   Follow up plan: Return in about 4 weeks (around 04/15/2017). Assunta Found, MD [facility]  ADDENDUM Treat cystitis with nitrofurantoin rtc next week for INR check with kristen

## 2017-03-18 NOTE — Patient Instructions (Addendum)
[phone]-- Dr. Rosezella [place] office  Abdominal xray today  Labs today  Follow up in 4 weeks with me  Can try over the counter Aspercream for your shoulder  Increase linzess dose to two tabs in the morning When you pick up your next prescription you will have a higher dose and can take one tab in the morning  Increase water intake Increase fiber in your diet Goal for 1-2 soft daily stools  Continue same dosing for warfarin: Anticoagulation Warfarin Dose Instructions as of 03/18/2017      Sun Mon Tue Wed Thu Fri Sat   New Dose 2.5 mg 2.5 mg 2.5 mg 5 mg 2.5 mg 5 mg 2.5 mg    Description   Cont same warfarin dosing of 5mg  tablets - take 1 tablet wednesdays and fridays.  Take 1/2 tablet all other days.   INR was 2.5 today (goal is 2.0 to 3.0)

## 2017-03-19 LAB — CBC WITH DIFFERENTIAL/PLATELET
Basophils Absolute: 0.1 10*3/uL (ref 0.0–0.2)
Basos: 1 %
EOS (ABSOLUTE): 0.2 10*3/uL (ref 0.0–0.4)
Eos: 2 %
Hematocrit: 41.2 % (ref 34.0–46.6)
Hemoglobin: 13.9 g/dL (ref 11.1–15.9)
Immature Grans (Abs): 0 10*3/uL (ref 0.0–0.1)
Immature Granulocytes: 0 %
Lymphocytes Absolute: 1.8 10*3/uL (ref 0.7–3.1)
Lymphs: 25 %
MCH: 29 pg (ref 26.6–33.0)
MCHC: 33.7 g/dL (ref 31.5–35.7)
MCV: 86 fL (ref 79–97)
Monocytes Absolute: 0.6 10*3/uL (ref 0.1–0.9)
Monocytes: 8 %
Neutrophils Absolute: 4.6 10*3/uL (ref 1.4–7.0)
Neutrophils: 64 %
Platelets: 234 10*3/uL (ref 150–379)
RBC: 4.79 x10E6/uL (ref 3.77–5.28)
RDW: 16.1 % — ABNORMAL HIGH (ref 12.3–15.4)
WBC: 7.3 10*3/uL (ref 3.4–10.8)

## 2017-03-19 LAB — CMP14+EGFR
ALT: 22 IU/L (ref 0–32)
AST: 19 IU/L (ref 0–40)
Albumin/Globulin Ratio: 1.7 (ref 1.2–2.2)
Albumin: 4 g/dL (ref 3.5–4.8)
Alkaline Phosphatase: 74 IU/L (ref 39–117)
BUN/Creatinine Ratio: 14 (ref 12–28)
BUN: 16 mg/dL (ref 8–27)
Bilirubin Total: 0.4 mg/dL (ref 0.0–1.2)
CO2: 25 mmol/L (ref 20–29)
Calcium: 8.9 mg/dL (ref 8.7–10.3)
Chloride: 100 mmol/L (ref 96–106)
Creatinine, Ser: 1.11 mg/dL — ABNORMAL HIGH (ref 0.57–1.00)
GFR calc Af Amer: 55 mL/min/{1.73_m2} — ABNORMAL LOW (ref >59)
GFR calc non Af Amer: 48 mL/min/{1.73_m2} — ABNORMAL LOW (ref >59)
Globulin, Total: 2.3 g/dL (ref 1.5–4.5)
Glucose: 94 mg/dL (ref 65–99)
Potassium: 4.3 mmol/L (ref 3.5–5.2)
Sodium: 138 mmol/L (ref 134–144)
Total Protein: 6.3 g/dL (ref 6.0–8.5)

## 2017-03-19 LAB — TSH: TSH: 1.9 u[IU]/mL (ref 0.450–4.500)

## 2017-03-20 LAB — URINE CULTURE

## 2017-03-31 DIAGNOSIS — R202 Paresthesia of skin: Secondary | ICD-10-CM | POA: Diagnosis not present

## 2017-03-31 DIAGNOSIS — R200 Anesthesia of skin: Secondary | ICD-10-CM | POA: Diagnosis not present

## 2017-04-09 DIAGNOSIS — M75111 Incomplete rotator cuff tear or rupture of right shoulder, not specified as traumatic: Secondary | ICD-10-CM | POA: Diagnosis not present

## 2017-04-09 DIAGNOSIS — G5601 Carpal tunnel syndrome, right upper limb: Secondary | ICD-10-CM | POA: Diagnosis not present

## 2017-04-09 DIAGNOSIS — M19011 Primary osteoarthritis, right shoulder: Secondary | ICD-10-CM | POA: Diagnosis not present

## 2017-04-14 ENCOUNTER — Telehealth: Payer: Self-pay | Admitting: Pediatrics

## 2017-04-14 DIAGNOSIS — I4891 Unspecified atrial fibrillation: Secondary | ICD-10-CM

## 2017-04-14 NOTE — Telephone Encounter (Signed)
Received fax from [place], upcoming R reverse shoulder, R hand carpel tunnel release Needs follow up with cardiology for pre-op clearance.

## 2017-04-15 ENCOUNTER — Ambulatory Visit (INDEPENDENT_AMBULATORY_CARE_PROVIDER_SITE_OTHER): Payer: Medicare Other | Admitting: Pediatrics

## 2017-04-15 ENCOUNTER — Encounter: Payer: Self-pay | Admitting: Pediatrics

## 2017-04-15 VITALS — BP 132/88 | HR 87 | Temp 97.5°F | Ht 70.0 in | Wt 180.4 lb

## 2017-04-15 DIAGNOSIS — R109 Unspecified abdominal pain: Secondary | ICD-10-CM | POA: Diagnosis not present

## 2017-04-15 DIAGNOSIS — Z5181 Encounter for therapeutic drug level monitoring: Secondary | ICD-10-CM

## 2017-04-15 DIAGNOSIS — I4891 Unspecified atrial fibrillation: Secondary | ICD-10-CM | POA: Diagnosis not present

## 2017-04-15 DIAGNOSIS — N3090 Cystitis, unspecified without hematuria: Secondary | ICD-10-CM

## 2017-04-15 LAB — MICROSCOPIC EXAMINATION
Renal Epithel, UA: NONE SEEN /hpf
WBC, UA: >30 /hpf — AB (ref 0–?)

## 2017-04-15 LAB — URINALYSIS, COMPLETE
Bilirubin, UA: NEGATIVE
Glucose, UA: NEGATIVE
Ketones, UA: NEGATIVE
Nitrite, UA: NEGATIVE
Specific Gravity, UA: 1.015 (ref 1.005–1.030)
Urobilinogen, Ur: 0.2 mg/dL (ref 0.2–1.0)
pH, UA: 7 (ref 5.0–7.5)

## 2017-04-15 LAB — COAGUCHEK XS/INR WAIVED
INR: 1.7 — ABNORMAL HIGH (ref 0.9–1.1)
Prothrombin Time: 20.4 s

## 2017-04-15 MED ORDER — SULFAMETHOXAZOLE-TRIMETHOPRIM 800-160 MG PO TABS: 1.0000 | ORAL_TABLET | Freq: Two times a day (BID) | ORAL | 0 refills | Status: DC

## 2017-04-15 NOTE — Patient Instructions (Signed)
Anticoagulation Warfarin Dose Instructions as of 04/15/2017      Virginia Young Tue Wed Thu Fri Sat   New Dose 2.5 mg 2.5 mg 2.5 mg 2.5 mg 2.5 mg 2.5 mg 2.5 mg    Description   Continue same warfarin dosing of 5mg  tablets - take 1/2 tablet all days.   INR was 1.7 today (goal is 2.0 to 3.0) We are starting you on antibiotic that will increase your INR however.

## 2017-04-15 NOTE — Progress Notes (Signed)
  Subjective:   Patient ID: [name], female    DOB: September 02, 1940, 76 y.o.   MRN: [id number] CC: Follow-up med problems HPI: VAANI MORREN is a 76 y.o. female presenting for Follow-up  Eating three meals a day Gets nausea with all meals Having stools most days, some days none, some days 4-5 times a day  No burning with urination Recently seen for abd pain 4 weeks ago Found to have UTI Treated with nitrofurantoin x 5 days BID Symptoms improved but then came back over past few days  Discussed with daughter with pt's permission Says she pt does well most days, cares for great granddaughter regularly She says she eats well most of the time Complains off and on of nausea Has more complaints on days that she is alone vs with family  Taking warfarin daily No bleeding  Relevant past medical, surgical, family and social history reviewed. Allergies and medications reviewed and updated. History  Smoking Status  . Never Smoker  Smokeless Tobacco  . Never Used   ROS: Per HPI   Objective:    BP 132/88   Pulse 87   Temp (!) 97.5 F (36.4 C) (Oral)   Ht '5\' 10"'$  (1.778 m)   Wt 180 lb 6.4 oz (81.8 kg)   BMI 25.88 kg/m   Wt Readings from Last 3 Encounters:  04/15/17 180 lb 6.4 oz (81.8 kg)  03/18/17 178 lb 6.4 oz (80.9 kg)  03/03/17 183 lb 3.2 oz (83.1 kg)    Gen: NAD, alert, cooperative with exam, NCAT EYES: EOMI, no conjunctival injection, or no icterus ENT:  TMs pearly gray b/l, OP without erythema LYMPH: no cervical LAD CV: NRRR, normal S1/S2, no murmur, distal pulses 2+ b/l Resp: CTABL, no wheezes, normal WOB Abd: +BS, soft, ttp R abd, inguinal area No hardness, no masses, no rebound, no peritoneal signs, ND. no organomegaly Ext:1+ pitting edema b/l LE, warm Neuro: Alert and oriented  Assessment & Plan:  Ravyn was seen today for follow-up multiple med problems  Diagnoses and all orders for this visit:  Abdominal pain, unspecified abdominal  location Constipation UTI, constipation likely comtributing No red flags Return precautions discussed Treat above, rtc 2 mo Goal daily stools -     CMP14+EGFR -     CBC with Differential/Platelet -     CoaguChek XS/INR Waived -     Urinalysis, Complete -     Urine Culture  Cystitis UA+ F/u culture, start below -     sulfamethoxazole-trimethoprim (BACTRIM DS) 800-160 MG tablet; Take 1 tablet by mouth 2 (two) times daily.  Encounter for therapeutic drug monitoring  Atrial fibrillation, unspecified type ([place]) INR 1.7 Starting bactrim Decrease to 2.'5mg'$  daily Recheck in 1 week See anticoag documentation  Other orders -     Microscopic Examination   Follow up plan: Return in about 2 months (around 06/15/2017). Assunta Found, MD [place]

## 2017-04-16 ENCOUNTER — Telehealth: Payer: Self-pay | Admitting: *Deleted

## 2017-04-16 LAB — CBC WITH DIFFERENTIAL/PLATELET
Basophils Absolute: 0.1 10*3/uL (ref 0.0–0.2)
Basos: 1 %
EOS (ABSOLUTE): 0.1 10*3/uL (ref 0.0–0.4)
Eos: 1 %
Hematocrit: 41.1 % (ref 34.0–46.6)
Hemoglobin: 13.4 g/dL (ref 11.1–15.9)
Immature Grans (Abs): 0 10*3/uL (ref 0.0–0.1)
Immature Granulocytes: 0 %
Lymphocytes Absolute: 1.6 10*3/uL (ref 0.7–3.1)
Lymphs: 22 %
MCH: 29.2 pg (ref 26.6–33.0)
MCHC: 32.6 g/dL (ref 31.5–35.7)
MCV: 90 fL (ref 79–97)
Monocytes Absolute: 0.7 10*3/uL (ref 0.1–0.9)
Monocytes: 10 %
Neutrophils Absolute: 4.8 10*3/uL (ref 1.4–7.0)
Neutrophils: 66 %
Platelets: 244 10*3/uL (ref 150–379)
RBC: 4.59 x10E6/uL (ref 3.77–5.28)
RDW: 15.7 % — ABNORMAL HIGH (ref 12.3–15.4)
WBC: 7.3 10*3/uL (ref 3.4–10.8)

## 2017-04-16 LAB — CMP14+EGFR
ALT: 17 IU/L (ref 0–32)
AST: 20 IU/L (ref 0–40)
Albumin/Globulin Ratio: 2.1 (ref 1.2–2.2)
Albumin: 4 g/dL (ref 3.5–4.8)
Alkaline Phosphatase: 98 IU/L (ref 39–117)
BUN/Creatinine Ratio: 11 — ABNORMAL LOW (ref 12–28)
BUN: 11 mg/dL (ref 8–27)
Bilirubin Total: 0.4 mg/dL (ref 0.0–1.2)
CO2: 24 mmol/L (ref 20–29)
Calcium: 8.7 mg/dL (ref 8.7–10.3)
Chloride: 97 mmol/L (ref 96–106)
Creatinine, Ser: 0.97 mg/dL (ref 0.57–1.00)
GFR calc Af Amer: 65 mL/min/{1.73_m2} (ref >59)
GFR calc non Af Amer: 57 mL/min/{1.73_m2} — ABNORMAL LOW (ref >59)
Globulin, Total: 1.9 g/dL (ref 1.5–4.5)
Glucose: 80 mg/dL (ref 65–99)
Potassium: 4.1 mmol/L (ref 3.5–5.2)
Sodium: 137 mmol/L (ref 134–144)
Total Protein: 5.9 g/dL — ABNORMAL LOW (ref 6.0–8.5)

## 2017-04-16 NOTE — Telephone Encounter (Signed)
INR appt made with Dr. Evette Doffing Monday 10/29.  Patient will come in earlier than appt time

## 2017-04-21 ENCOUNTER — Ambulatory Visit (INDEPENDENT_AMBULATORY_CARE_PROVIDER_SITE_OTHER): Payer: Medicare Other | Admitting: Pediatrics

## 2017-04-21 DIAGNOSIS — Z5181 Encounter for therapeutic drug level monitoring: Secondary | ICD-10-CM | POA: Diagnosis not present

## 2017-04-21 DIAGNOSIS — N3000 Acute cystitis without hematuria: Secondary | ICD-10-CM | POA: Diagnosis not present

## 2017-04-21 DIAGNOSIS — K1370 Unspecified lesions of oral mucosa: Secondary | ICD-10-CM | POA: Diagnosis not present

## 2017-04-21 DIAGNOSIS — I4891 Unspecified atrial fibrillation: Secondary | ICD-10-CM

## 2017-04-21 LAB — URINE CULTURE

## 2017-04-21 LAB — COAGUCHEK XS/INR WAIVED
INR: 2.3 — ABNORMAL HIGH (ref 0.9–1.1)
Prothrombin Time: 27.3 s

## 2017-04-21 MED ORDER — DOXYCYCLINE HYCLATE 100 MG PO TABS: 100.0000 mg | ORAL_TABLET | Freq: Two times a day (BID) | ORAL | 0 refills | Status: DC

## 2017-04-21 MED ORDER — CIPROFLOXACIN HCL 500 MG PO TABS: 500.0000 mg | ORAL_TABLET | Freq: Two times a day (BID) | ORAL | 0 refills | Status: DC

## 2017-04-21 NOTE — Progress Notes (Addendum)
  Subjective:   Patient ID: Virginia Young, female    DOB: 06-Sep-1940, 76 y.o.   MRN: [id number] CC: INR  HPI: Virginia Young is a 76 y.o. female presenting for f/u INR  Taking decreased dose since starting bactrim Has three pills left Has had an upset stomach since being on bactrim, feels like same upset stomach she usually has on antibiotics Past day has had some mouth sores Continues to have dysuria Started to teat UTI found in work up for ongoing abd pain Appetite has been ok Having regular bowel movements on linzess No blood in stools No fevers  Relevant past medical, surgical, family and social history reviewed. Allergies and medications reviewed and updated. History  Smoking Status  . Never Smoker  Smokeless Tobacco  . Never Used   ROS: Per HPI   Objective:    Gen: NAD, alert, cooperative with exam, NCAT EYES: EOMI, no conjunctival injection, or no icterus ENT:  TMs pearly gray b/l, red areas L buccal surface, lower lip LYMPH: no cervical LAD CV: irregular, normal rate, normal S1/S2 Resp: CTABL, no wheezes, normal WOB Abd: +BS, soft, mildly tender throughout, ND. no guarding or organomegaly Ext: No edema, warm Neuro: Alert and oriented, strength equal b/l UE and LE, coordination grossly normal GU: nl ext female genitalia, no vaginal mucosal lesions  Assessment & Plan:  Diagnoses and all orders for this visit:  Atrial fibrillation, unspecified type (HCC) INR 2.3, within range, cont current dosing -     CoaguChek XS/INR Waived  Acute cystitis without hematuria Stop bactrim, still with UTI symptoms, ?cause of mouth lesions On amiodarone, will Rx below x 7 days to avoid qt prolongation with fluoroquinolones -     doxycycline (VIBRA-TABS) 100 MG tablet; Take 1 tablet (100 mg total) by mouth 2 (two) times daily.  Encounter for therapeutic drug monitoring  Mouth lesion Stop bactrim No genital involvement Any new or worsening symptoms needs to be  seen  Follow up plan: Return in about 1 week (around 04/28/2017). Assunta Found, MD [place]

## 2017-04-21 NOTE — Patient Instructions (Signed)
Anticoagulation Warfarin Dose Instructions as of 04/21/2017      Dorene Grebe Tue Wed Thu Fri Sat   New Dose 2.5 mg 2.5 mg 2.5 mg 2.5 mg 2.5 mg 2.5 mg 2.5 mg    Description   Continue same warfarin dosing of 5mg  tablets - take 1/2 tablet all days.   INR was 2.3 today (goal is 2.0 to 3.0)

## 2017-04-24 ENCOUNTER — Telehealth: Payer: Self-pay | Admitting: *Deleted

## 2017-04-24 NOTE — Telephone Encounter (Signed)
Patient aware that all information has been sent to [facility] on 04/14/2017.  Gave patient phone number to [facility] [phone]

## 2017-04-28 ENCOUNTER — Other Ambulatory Visit: Payer: Self-pay | Admitting: *Deleted

## 2017-04-28 ENCOUNTER — Encounter: Payer: Self-pay | Admitting: Pediatrics

## 2017-04-28 ENCOUNTER — Ambulatory Visit (INDEPENDENT_AMBULATORY_CARE_PROVIDER_SITE_OTHER): Payer: Medicare Other | Admitting: Pediatrics

## 2017-04-28 VITALS — BP 137/89 | HR 87 | Temp 97.0°F | Ht 70.0 in | Wt 176.8 lb

## 2017-04-28 DIAGNOSIS — R1011 Right upper quadrant pain: Secondary | ICD-10-CM | POA: Diagnosis not present

## 2017-04-28 DIAGNOSIS — Z5181 Encounter for therapeutic drug level monitoring: Secondary | ICD-10-CM | POA: Diagnosis not present

## 2017-04-28 DIAGNOSIS — I4891 Unspecified atrial fibrillation: Secondary | ICD-10-CM

## 2017-04-28 LAB — COAGUCHEK XS/INR WAIVED
INR: 1.5 — ABNORMAL HIGH (ref 0.9–1.1)
Prothrombin Time: 17.5 s

## 2017-04-28 MED ORDER — OMEPRAZOLE 40 MG PO CPDR: 40.0000 mg | DELAYED_RELEASE_CAPSULE | Freq: Every day | ORAL | 1 refills | Status: DC

## 2017-04-28 NOTE — Telephone Encounter (Signed)
rx sent to pharmacy

## 2017-04-28 NOTE — Telephone Encounter (Signed)
OK to send in, thanks

## 2017-04-28 NOTE — Progress Notes (Signed)
  Subjective:   Patient ID: Virginia Young, female    DOB: 1940/07/18, 76 y.o.   MRN: [id number] CC: Follow-up (INr)  HPI: Virginia Young is a 76 y.o. female presenting for Follow-up (INr)  Afib: on coumadin Recently on antibiotics for UTI Has 1 day left INR today 1.5 Has not missed any doses  R side pain: ongoing for several months No injuries Comes and goes Treated for multiple UTIs past 5 months No h/o stones No dysuria, fevers Speaking with daughter some days she has pain, some days she doesn't Daughter says pt always seems to notice the pain more on days family doesn't come around Pt keeps 19yo great granddaughter for two hours most afternoons  Says she has to stop and rest due to SOB after walking apprx 74ft-100ft, sometimes less, such as from her car to the front door, or the clinic room to the check out. No chest pain Has h/o NICM, EF of 40% last ECHO 2017 Has had exertional dyspnea for several years per chart review Last stress test in 2013, low risk  Relevant past medical, surgical, family and social history reviewed. Allergies and medications reviewed and updated. Social History   Tobacco Use  Smoking Status Never Smoker  Smokeless Tobacco Never Used   ROS: Per HPI   Objective:    BP 137/89   Pulse 87   Temp (!) 97 F (36.1 C) (Oral)   Ht 5\' 10"  (1.778 m)   Wt 176 lb 12.8 oz (80.2 kg)   BMI 25.37 kg/m   Wt Readings from Last 3 Encounters:  04/28/17 176 lb 12.8 oz (80.2 kg)  04/15/17 180 lb 6.4 oz (81.8 kg)  03/18/17 178 lb 6.4 oz (80.9 kg)    Gen: NAD, alert, cooperative with exam, NCAT EYES: EOMI, no conjunctival injection, or no icterus ENT:  OP without erythema LYMPH: no cervical LAD CV: NRRR, normal S1/S2 Resp: CTABL, no wheezes, normal WOB Abd: +BS, soft, ttp R lower quad, R upper quad, neg murphy's, no rebound. ND.  Ext: 1+ pitting edema L LE, trace RLE edema, warm Neuro: Alert and oriented MSK: ttp over R side of ribs  Assessment  & Plan:  Rut was seen today for follow-up med problems.  Diagnoses and all orders for this visit:  Atrial fibrillation, unspecified type ([place]) INR 1.5, increase warfarin to 5mg  tonight, 2.5 tabs other nights See anticoag documentation -     CoaguChek XS/INR Waived  Right upper quadrant abdominal pain Continues to be very bothered by R sided pain Has one day left of abx for UTI, no improvement with symptoms with treatment ttp R side of abd -     CT ABDOMEN PELVIS W WO CONTRAST; Future  Encounter for therapeutic drug monitoring   Follow up plan: Return in about 2 weeks (around 05/12/2017) for INR. Assunta Found, MD [place]

## 2017-04-28 NOTE — Telephone Encounter (Signed)
Fax refill request Omeprazole 40 mg cap 1 cap daily #90 Not on current med list, pt seen today [facility]

## 2017-05-05 ENCOUNTER — Ambulatory Visit (HOSPITAL_COMMUNITY): Payer: Medicare Other

## 2017-05-08 ENCOUNTER — Ambulatory Visit (HOSPITAL_COMMUNITY)
Admission: RE | Admit: 2017-05-08 | Discharge: 2017-05-08 | Disposition: A | Discharge status: Home / Self Care | Payer: Medicare Other | Source: Ambulatory Visit | Attending: Pediatrics | Admitting: Pediatrics

## 2017-05-08 DIAGNOSIS — D259 Leiomyoma of uterus, unspecified: Secondary | ICD-10-CM | POA: Diagnosis not present

## 2017-05-08 DIAGNOSIS — N854 Malposition of uterus: Secondary | ICD-10-CM | POA: Diagnosis not present

## 2017-05-08 DIAGNOSIS — R1011 Right upper quadrant pain: Secondary | ICD-10-CM | POA: Diagnosis not present

## 2017-05-08 MED ORDER — IOPAMIDOL (ISOVUE-300) INJECTION 61%
100.0000 mL | Freq: Once | INTRAVENOUS | Status: AC | PRN
  Administered 2017-05-08: 100 mL via INTRAVENOUS

## 2017-05-09 ENCOUNTER — Encounter: Payer: Self-pay | Admitting: Pediatrics

## 2017-05-09 ENCOUNTER — Ambulatory Visit (INDEPENDENT_AMBULATORY_CARE_PROVIDER_SITE_OTHER): Payer: Medicare Other | Admitting: Pediatrics

## 2017-05-09 VITALS — BP 135/89 | HR 81 | Temp 97.1°F | Ht 70.0 in | Wt 178.6 lb

## 2017-05-09 DIAGNOSIS — N390 Urinary tract infection, site not specified: Secondary | ICD-10-CM | POA: Diagnosis not present

## 2017-05-09 DIAGNOSIS — Z7901 Long term (current) use of anticoagulants: Secondary | ICD-10-CM

## 2017-05-09 DIAGNOSIS — I4891 Unspecified atrial fibrillation: Secondary | ICD-10-CM | POA: Diagnosis not present

## 2017-05-09 DIAGNOSIS — R109 Unspecified abdominal pain: Secondary | ICD-10-CM | POA: Diagnosis not present

## 2017-05-09 DIAGNOSIS — Z5181 Encounter for therapeutic drug level monitoring: Secondary | ICD-10-CM | POA: Diagnosis not present

## 2017-05-09 DIAGNOSIS — R300 Dysuria: Secondary | ICD-10-CM

## 2017-05-09 DIAGNOSIS — I428 Other cardiomyopathies: Secondary | ICD-10-CM | POA: Diagnosis not present

## 2017-05-09 LAB — COAGUCHEK XS/INR WAIVED
INR: 3.6 — ABNORMAL HIGH (ref 0.9–1.1)
Prothrombin Time: 42.7 s

## 2017-05-09 NOTE — Progress Notes (Signed)
  Subjective:   Patient ID: Virginia Young, female    DOB: 1940/08/03, 76 y.o.   MRN: [id number] CC: Follow-up (INR)  HPI: Virginia Young is a 76 y.o. female presenting for Follow-up (INR)  Here today with her grandson  Abdominal pain: Primarily right lower quadrant Has been going on for several months Has had multiple UTIs, culture positive, treated with antibiotics Had a CT scan last week showing no obvious cause of ongoing abdominal pain, no hernias or masses Last colonoscopy in 2013 Having regular bowel movements, sometimes misses a day but says she usually goes the next morning The pain is improved if she sits and does not move It is worse when she is moving around or picking things up He does keep her from doing some of the things that she wants to do she says He was seen by gynecology last year had ultrasounds done that did not show obvious cause of her abdominal pain/pelvic pain She did have a hernia repair in the right lower quadrant years ago  Continues to have some burning with urination, not with every void No fevers  Afib: No chest palpitations Taking warfarin 2.5 mg daily other than Mondays Has not missed any doses  Relevant past medical, surgical, family and social history reviewed. Allergies and medications reviewed and updated. Social History   Tobacco Use  Smoking Status Never Smoker  Smokeless Tobacco Never Used   ROS: Per HPI   Objective:    BP 135/89   Pulse 81   Temp (!) 97.1 F (36.2 C) (Oral)   Ht 5\' 10"  (1.778 m)   Wt 178 lb 9.6 oz (81 kg)   BMI 25.63 kg/m   Wt Readings from Last 3 Encounters:  05/09/17 178 lb 9.6 oz (81 kg)  04/28/17 176 lb 12.8 oz (80.2 kg)  04/15/17 180 lb 6.4 oz (81.8 kg)    Gen: NAD, alert, cooperative with exam, NCAT EYES: EOMI, no conjunctival injection, or no icterus ENT:  OP without erythema LYMPH: no cervical LAD CV: NRRR, normal S1/S2, no murmur, distal pulses 2+ b/l Resp: CTABL, no wheezes, normal  WOB Abd: +BS, soft, quite ttp RLQ, R side of abdomen, ND. Minimal suprapubic tenderness, no guarding or organomegaly Ext: No edema, warm Neuro: Alert and oriented  Assessment & Plan:  Truly was seen today for follow-up med problems  Diagnoses and all orders for this visit:  Long term current use of anticoagulant INR 3.6, recent antibiotic use Decrease dose per anticoagulation documention -     CoaguChek XS/INR Waived  Encounter for therapeutic drug monitoring  Atrial fibrillation, unspecified type (HCC) Stable, no palpitations  Dysuria Not with every void, last few months has had culture+ UTI with symptoms Recently finished anitbiotics for UTI Will repeat UA given symptoms -     Urinalysis, Complete -     Urine Culture  Abdominal pain, unspecified abdominal location CT scan without obvious cause of abd pain, no elevation in WBC Has been ongoing for over a year -     Ambulatory referral to Gastroenterology  Recurrent UTI -     Ambulatory referral to Urology   Follow up plan: 2 week INR recheck Assunta Found, MD [place]

## 2017-05-10 ENCOUNTER — Encounter: Payer: Self-pay | Admitting: Pediatrics

## 2017-05-23 ENCOUNTER — Encounter: Payer: Self-pay | Admitting: Pediatrics

## 2017-05-23 ENCOUNTER — Ambulatory Visit (INDEPENDENT_AMBULATORY_CARE_PROVIDER_SITE_OTHER): Payer: Medicare Other | Admitting: Pediatrics

## 2017-05-23 VITALS — BP 126/82 | HR 82 | Temp 97.3°F | Ht 70.0 in | Wt 181.4 lb

## 2017-05-23 DIAGNOSIS — Z5181 Encounter for therapeutic drug level monitoring: Secondary | ICD-10-CM

## 2017-05-23 DIAGNOSIS — I4891 Unspecified atrial fibrillation: Secondary | ICD-10-CM | POA: Diagnosis not present

## 2017-05-23 LAB — COAGUCHEK XS/INR WAIVED
INR: 1.7 — ABNORMAL HIGH (ref 0.9–1.1)
Prothrombin Time: 20.1 s

## 2017-05-23 NOTE — Progress Notes (Signed)
  Subjective:   Patient ID: Virginia Young, female    DOB: 1940-10-12, 76 y.o.   MRN: [id number] CC: Follow-up (INR)  HPI: Virginia Young is a 76 y.o. female presenting for Follow-up (INR)  Urinary symptoms have mostly resolved Is every once in a while will feel some irritation in genital area but does not bother her  Taking warfarin as prescribed Last check was slightly elevated Has been on and off antibiotics for the last couple of months for UTIs  No episodes of bleeding at home  Relevant past medical, surgical, family and social history reviewed. Allergies and medications reviewed and updated. Social History   Tobacco Use  Smoking Status Never Smoker  Smokeless Tobacco Never Used   ROS: Per HPI   Objective:    BP 126/82   Pulse 82   Temp (!) 97.3 F (36.3 C) (Oral)   Ht 5\' 10"  (1.778 m)   Wt 181 lb 6.4 oz (82.3 kg)   BMI 26.03 kg/m   Wt Readings from Last 3 Encounters:  05/23/17 181 lb 6.4 oz (82.3 kg)  05/09/17 178 lb 9.6 oz (81 kg)  04/28/17 176 lb 12.8 oz (80.2 kg)    Gen: NAD, alert, cooperative with exam, NCAT EYES: EOMI, no conjunctival injection, or no icterus CV: WWP Resp:  normal WOB Ext: No edema, warm Neuro: Alert and oriented MSK: normal muscle bulk  Assessment & Plan:  Virginia Young was seen today for follow-up INR  Diagnoses and all orders for this visit:  Atrial fibrillation, unspecified type ([place]) See anti-coag documentation INR 1.7 Will take a full tab Mondays and Thursdays (5 mg) Other days take 2.5 mg Has been on this dose before with slight increase in INR, but was also on antibiotics at the time If elevated at next check in 2 weeks, will switch to 3 mg daily -     CoaguChek XS/INR Waived  Encounter for therapeutic drug monitoring   Follow up plan: 2 weeks Assunta Found, MD [place]

## 2017-05-28 ENCOUNTER — Ambulatory Visit (INDEPENDENT_AMBULATORY_CARE_PROVIDER_SITE_OTHER): Payer: Medicare Other | Admitting: Internal Medicine

## 2017-06-03 ENCOUNTER — Encounter (INDEPENDENT_AMBULATORY_CARE_PROVIDER_SITE_OTHER): Payer: Self-pay

## 2017-06-03 ENCOUNTER — Ambulatory Visit (INDEPENDENT_AMBULATORY_CARE_PROVIDER_SITE_OTHER): Payer: Medicare Other | Admitting: Internal Medicine

## 2017-06-05 ENCOUNTER — Ambulatory Visit (HOSPITAL_COMMUNITY)
Admission: RE | Admit: 2017-06-05 | Discharge: 2017-06-05 | Disposition: A | Discharge status: Home / Self Care | Payer: Medicare Other | Source: Ambulatory Visit | Attending: Internal Medicine | Admitting: Internal Medicine

## 2017-06-05 ENCOUNTER — Encounter (INDEPENDENT_AMBULATORY_CARE_PROVIDER_SITE_OTHER): Payer: Self-pay | Admitting: Internal Medicine

## 2017-06-05 ENCOUNTER — Ambulatory Visit (INDEPENDENT_AMBULATORY_CARE_PROVIDER_SITE_OTHER): Payer: Medicare Other | Admitting: Internal Medicine

## 2017-06-05 VITALS — BP 140/82 | HR 72 | Temp 97.4°F | Ht 70.0 in | Wt 179.4 lb

## 2017-06-05 DIAGNOSIS — M25551 Pain in right hip: Secondary | ICD-10-CM | POA: Insufficient documentation

## 2017-06-05 NOTE — Progress Notes (Signed)
Subjective:    Patient ID: Virginia Young, female    DOB: 16-Nov-1940, 76 y.o.   MRN: [id number]  HPI Referred by Dr. Evette Doffing for abdominal pain.  Her last colonoscopy was in 2014 by Dr. Hilarie Fredrickson  Which revealed mild diverticulosis ascending and sigmoid colon. Colon otherwise normal. Internal hemorrhoids.  States she has pain in her rt lower abdomen, groin and hip.  Feels like a knife is jabbing her in her flank. Pain occurs during movement. Her BMs are okay. She takes Linzess for her constipation.  She has a BM every other day.  Her appetite is good. No weight loss.  No melena or BRRB.  She has pain when she walks in her rt hip and abdomen.  She fell about 6- 8 months ago on her rt hip.  She walks with a limp.  Has been walking with a cane for about 8 months.     05/08/2017 CT abdomen/pelvis with CM: Rt lower quadrant pain and nausea x 2 yrs.  IMPRESSION: 1. No acute abdominal/pelvic findings, mass lesions or lymphadenopathy. 2. Stable calcified uterine fibroids.  No adnexal mass.   Hx of atrial fib and maintained of Warfarin.   Review of Systems Past Medical History:  Diagnosis Date  . Allergy   . Atrial fibrillation (HCC)    a. coumadin;  b. Amiodarone  . Cataract   . Chest pain 03/2012   a. Lex MV 10/13:  EF 64%, dist ant and apical defect sugg of soft tissue atten, no ischemia  . Chronic systolic heart failure ([place])   . Depression   . GERD (gastroesophageal reflux disease)   . HLD (hyperlipidemia)   . Incontinence of urine   . Mild mitral regurgitation   . MS (multiple sclerosis) ([place])   . NICM (nonischemic cardiomyopathy) ([place])    a. neg CLite in 2003;  b. EF 40-45% in past;   c.  Echo 12/11: EF 35-40%, mild MR, mild LAE, mild RAE, small pericardial effusion   . Osteoarthritis   . Osteoporosis   . Stasis ulcer ([place])   . Uterine prolapse   . Vaginal prolapse without uterine prolapse   . Varicose veins     Past Surgical History:  Procedure Laterality Date    . ANTERIOR AND POSTERIOR VAGINAL REPAIR W/ SACROSPINOUS LIGAMENT SUSPENSION  2016   [facility]  . BLADDER SURGERY    . cataract surgery Bilateral   . EYE SURGERY    . INGUINAL HERNIA REPAIR     right  . LIPOMA EXCISION     h/o removed from upper back x 2    Allergies  Allergen Reactions  . Bactrim [Sulfamethoxazole-Trimethoprim]     Mouth sores  . Lipitor [Atorvastatin Calcium]     myalgia    Current Outpatient Medications on File Prior to Visit  Medication Sig Dispense Refill  . alendronate (FOSAMAX) 70 MG tablet TAKE 1 TABLET ONCE A WEEK WITH A FULL GLASS OF WATER ON AN EMPTY STOMACH 12 tablet 1  . amiodarone (PACERONE) 200 MG tablet TAKE 1 TABLET (200 MG TOTAL) BY MOUTH DAILY. 90 tablet 1  . linaclotide (LINZESS) 145 MCG CAPS capsule Take 1 capsule (145 mcg total) by mouth daily before breakfast. 90 capsule 1  . meclizine (ANTIVERT) 25 MG tablet TAKE 1 TABLET (25 MG TOTAL) BY MOUTH 3 (THREE) TIMES DAILY AS NEEDED FOR DIZZINESS. 30 tablet 0  . warfarin (COUMADIN) 5 MG tablet Take 2.5-5mg  as prescribed 90 tablet 1  . omeprazole (PRILOSEC)  40 MG capsule Take 1 capsule (40 mg total) daily by mouth. (Patient not taking: Reported on 06/05/2017) 90 capsule 1   No current facility-administered medications on file prior to visit.         Objective:   Physical Exam Blood pressure 140/82, pulse 72, temperature (!) 97.4 F (36.3 C), height 5\' 10"  (1.778 m), weight 179 lb 6.4 oz (81.4 kg).  Alert and oriented. Skin warm and dry. Oral mucosa is moist.   . Sclera anicteric, conjunctivae is pink. Thyroid not enlarged. No cervical lymphadenopathy. Lungs clear. Heart regular rate and rhythm.  Abdomen is soft. Bowel sounds are positive. No hepatomegaly. No abdominal masses felt. No tenderness.  No edema to lower extremities.  Rt hip pain and grown pain to palpation. .  She has difficulty walking because of the pain in her rt hip.       Assessment & Plan:  Rt lower quadrant pain radiating into  back. Rt hip pain. Am going to get a rt hip xray.

## 2017-06-05 NOTE — Patient Instructions (Signed)
Rt hip xray.

## 2017-06-10 NOTE — Progress Notes (Signed)
HPI The patient presents for evaluation of atrial fibrillation and nonischemic cardiomyopathy.  She has a mildly reduced ejection fraction of 40% in 04/2016.   She has had fibrillation but seemed to maintain sinus rhythm on amiodarone.  (Until now ... see below) He is going to have shoulder surgery.  She is limited in her activities because of shoulder pain joint pains and also gait disturbance.  She is not describing any chest pressure, neck or arm discomfort.  He has baseline shortness of breath with activities but no resting shortness of breath, PND.  He has had no weight gain or edema.  I ambulated her around the office today and her heart rate went up.  We looked at an EKG and she was in atrial fibrillation.  She does not seem to notice this.  Her oxygen saturations were okay though she was breathless.     Allergies  Allergen Reactions  . Bactrim [Sulfamethoxazole-Trimethoprim]     Mouth sores  . Lipitor [Atorvastatin Calcium]     myalgia    Current Outpatient Medications  Medication Sig Dispense Refill  . alendronate (FOSAMAX) 70 MG tablet TAKE 1 TABLET ONCE A WEEK WITH A FULL GLASS OF WATER ON AN EMPTY STOMACH 12 tablet 1  . amiodarone (PACERONE) 200 MG tablet TAKE 1 TABLET (200 MG TOTAL) BY MOUTH DAILY. 90 tablet 1  . linaclotide (LINZESS) 145 MCG CAPS capsule Take 1 capsule (145 mcg total) by mouth daily before breakfast. 90 capsule 1  . meclizine (ANTIVERT) 25 MG tablet TAKE 1 TABLET (25 MG TOTAL) BY MOUTH 3 (THREE) TIMES DAILY AS NEEDED FOR DIZZINESS. 30 tablet 0  . omeprazole (PRILOSEC) 40 MG capsule Take 1 capsule (40 mg total) daily by mouth. 90 capsule 1  . warfarin (COUMADIN) 5 MG tablet Take 2.5-5mg  as prescribed 90 tablet 1   No current facility-administered medications for this visit.     Past Medical History:  Diagnosis Date  . Allergy   . Atrial fibrillation (HCC)    a. coumadin;  b. Amiodarone  . Cataract   . Chest pain 03/2012   a. Lex MV 10/13:  EF 64%,  dist ant and apical defect sugg of soft tissue atten, no ischemia  . Chronic systolic heart failure ([place])   . Depression   . GERD (gastroesophageal reflux disease)   . HLD (hyperlipidemia)   . Incontinence of urine   . Mild mitral regurgitation   . MS (multiple sclerosis) ([place])   . NICM (nonischemic cardiomyopathy) ([place])    a. neg CLite in 2003;  b. EF 40-45% in past;   c.  Echo 12/11: EF 35-40%, mild MR, mild LAE, mild RAE, small pericardial effusion   . Osteoarthritis   . Osteoporosis   . Stasis ulcer ([place])   . Uterine prolapse   . Vaginal prolapse without uterine prolapse   . Varicose veins     Past Surgical History:  Procedure Laterality Date  . ANTERIOR AND POSTERIOR VAGINAL REPAIR W/ SACROSPINOUS LIGAMENT SUSPENSION  2016   [facility]  . BLADDER SURGERY    . cataract surgery Bilateral   . EYE SURGERY    . INGUINAL HERNIA REPAIR     right  . LIPOMA EXCISION     h/o removed from upper back x 2    ROS:     As stated in the HPI and negative for all other systems.  PHYSICAL EXAM BP (!) 142/90   Pulse 70   Ht 5\' 10"  (  1.778 m)   Wt 179 lb (81.2 kg)   SpO2 94%   BMI 25.68 kg/m   GENERAL:  Well appearing NECK:  No jugular venous distention, waveform within normal limits, carotid upstroke brisk and symmetric, no bruits, no thyromegaly LUNGS:  Clear to auscultation bilaterally CHEST:  Unremarkable HEART:  PMI not displaced or sustained,S1 and S2 within normal limits, no S3, no clicks, no rubs, no murmurs, irregular ABD:  Flat, positive bowel sounds normal in frequency in pitch, no bruits, no rebound, no guarding, no midline pulsatile mass, no hepatomegaly, no splenomegaly EXT:  2 plus pulses throughout, no edema, no cyanosis no clubbing, varicosities and dependent rubor    Lab Results  Component Value Date   TSH 1.900 03/18/2017   ALT 17 04/15/2017   AST 20 04/15/2017   ALKPHOS 98 04/15/2017   BILITOT 0.4 04/15/2017   PROT 5.9 (L) 04/15/2017   ALBUMIN 4.0 04/15/2017     ASSESSMENT AND PLAN  DYSPNEA:   This is chronic.  She did have abnormal pulmonary function testing last year and I discussed this with her primary providers and suggested pulmonary consultation.  Today when she walked around the office her oxygen saturation stayed above 90%.  She really does not want to see another doctor so we will defer a pulmonary appointment which had been suggested last year.  CARDIOMYOPATHY: This is mildly reduced.  I do note that it was mildly reduced in the past and slightly better on a follow-up echo back to the previous slightly low baseline.  She had a previous ischemia workup with perfusion study and I do not think that further testing of this nature is indicated.  It seems to be a nonischemic cardiomyopathy.  She has lots of dizziness and has been intolerant to medications.  At this point will manage this very conservatively.  ATRIAL FIB:  Ms. TANJI STORRS has a CHA2DS2 - VASc score of 3 with a risk of stroke of 3.2%.  Tolerates anticoagulation and does not want to switch to DOAC.   She is in atrial fib.  I will schedule a 24-hour Holter to see if she chronically in this.  If she is without overt symptoms she might not need the amiodarone any longer.  PREOP: From a cardiovascular standpoint she is at some risk for cardiovascular complications because of her slightly reduced ejection fraction but this would not be prohibitive.  She need to have good volume management follow-up of her I's and O's.  However, no other change in her therapy other than holding her warfarin for 5 days prior to surgery and resuming per the surgeons.

## 2017-06-11 ENCOUNTER — Ambulatory Visit (INDEPENDENT_AMBULATORY_CARE_PROVIDER_SITE_OTHER): Payer: Medicare Other | Admitting: *Deleted

## 2017-06-11 ENCOUNTER — Ambulatory Visit (INDEPENDENT_AMBULATORY_CARE_PROVIDER_SITE_OTHER): Payer: Medicare Other

## 2017-06-11 ENCOUNTER — Encounter: Payer: Self-pay | Admitting: Cardiology

## 2017-06-11 ENCOUNTER — Ambulatory Visit (INDEPENDENT_AMBULATORY_CARE_PROVIDER_SITE_OTHER): Payer: Medicare Other | Admitting: Cardiology

## 2017-06-11 ENCOUNTER — Other Ambulatory Visit: Payer: Self-pay | Admitting: Pediatrics

## 2017-06-11 VITALS — BP 142/90 | HR 70 | Ht 70.0 in | Wt 179.0 lb

## 2017-06-11 DIAGNOSIS — Z5181 Encounter for therapeutic drug level monitoring: Secondary | ICD-10-CM

## 2017-06-11 DIAGNOSIS — I480 Paroxysmal atrial fibrillation: Secondary | ICD-10-CM

## 2017-06-11 DIAGNOSIS — R0602 Shortness of breath: Secondary | ICD-10-CM

## 2017-06-11 DIAGNOSIS — I420 Dilated cardiomyopathy: Secondary | ICD-10-CM | POA: Diagnosis not present

## 2017-06-11 LAB — COAGUCHEK XS/INR WAIVED
INR: 1.8 — ABNORMAL HIGH (ref 0.9–1.1)
Prothrombin Time: 22.1 s

## 2017-06-11 NOTE — Progress Notes (Signed)
24 hour Holter monitor placed.

## 2017-06-11 NOTE — Patient Instructions (Signed)
Medication Instructions:  The current medical regimen is effective;  continue present plan and medications.  Testing/Procedures: Your physician has recommended that you wear a holter monitor for 24 hours. Holter monitors are medical devices that record the heart's electrical activity. Doctors most often use these monitors to diagnose arrhythmias. Arrhythmias are problems with the speed or rhythm of the heartbeat. The monitor is a small, portable device. You can wear one while you do your normal daily activities. This is usually used to diagnose what is causing palpitations/syncope (passing out).  Follow-Up: Follow up with Dr Percival Spanish after your heart monitor.  If you need a refill on your cardiac medications before your next appointment, please call your pharmacy.  Thank you for choosing [facility]!!

## 2017-06-11 NOTE — Progress Notes (Signed)
Subjective:     Indication: atrial fibrillation Bleeding signs/symptoms: None Thromboembolic signs/symptoms: None  Missed Coumadin doses: None Medication changes: no Dietary changes: no Bacterial/viral infection: no Other concerns: no  The following portions of the patient's history were reviewed and updated as appropriate: allergies and current medications.  Review of Systems Pertinent items are noted in HPI.   Objective:    INR Today: 1.8 Current dose: 5 mg on Mon and Thurs and 2.5 mg all others    Assessment:    Subtherapeutic INR for goal of 2-3   Plan:    1. New dose: 5mg  on Mon, Wed, Friday and 2.5mg  all others   2. Next INR: 2 weeks    Discussed with Dr Evette Doffing  Chong Sicilian, RN

## 2017-06-12 DIAGNOSIS — I4892 Unspecified atrial flutter: Secondary | ICD-10-CM | POA: Diagnosis not present

## 2017-06-19 ENCOUNTER — Encounter (INDEPENDENT_AMBULATORY_CARE_PROVIDER_SITE_OTHER): Payer: Self-pay | Admitting: Internal Medicine

## 2017-06-19 NOTE — Progress Notes (Signed)
Patient was given an appointment for 08/14/17 at 1:45pm with Deberah Castle, NP.  A letter was mailed to the patient.

## 2017-06-27 ENCOUNTER — Ambulatory Visit (INDEPENDENT_AMBULATORY_CARE_PROVIDER_SITE_OTHER): Payer: Medicare Other | Admitting: Pediatrics

## 2017-06-27 ENCOUNTER — Encounter: Payer: Self-pay | Admitting: Pediatrics

## 2017-06-27 ENCOUNTER — Other Ambulatory Visit: Payer: Self-pay | Admitting: *Deleted

## 2017-06-27 VITALS — BP 127/75 | HR 61 | Temp 97.0°F | Ht 70.0 in | Wt 175.0 lb

## 2017-06-27 DIAGNOSIS — Z5181 Encounter for therapeutic drug level monitoring: Secondary | ICD-10-CM | POA: Diagnosis not present

## 2017-06-27 DIAGNOSIS — I480 Paroxysmal atrial fibrillation: Secondary | ICD-10-CM

## 2017-06-27 LAB — COAGUCHEK XS/INR WAIVED
INR: 3.5 — ABNORMAL HIGH (ref 0.9–1.1)
Prothrombin Time: 41.9 s

## 2017-06-27 MED ORDER — WARFARIN SODIUM 3 MG PO TABS: ORAL_TABLET | ORAL | 1 refills | Status: DC

## 2017-06-27 NOTE — Progress Notes (Signed)
  Subjective:   Patient ID: Virginia Young, female    DOB: 02/01/1941, 77 y.o.   MRN: [id number] CC: Follow-up ([place])  HPI: Virginia Young is a 77 y.o. female presenting for Follow-up ([place])  Feeling well Taking warfarin as prescribed, has not missed any doses No bleeding History of atrial fibrillation  Relevant past medical, surgical, family and social history reviewed. Allergies and medications reviewed and updated. Social History   Tobacco Use  Smoking Status Never Smoker  Smokeless Tobacco Never Used   ROS: Per HPI   Objective:    BP 127/75   Pulse 61   Temp (!) 97 F (36.1 C) (Oral)   Ht 5\' 10"  (1.778 m)   Wt 175 lb (79.4 kg)   BMI 25.11 kg/m   Wt Readings from Last 3 Encounters:  06/27/17 175 lb (79.4 kg)  06/11/17 179 lb (81.2 kg)  06/05/17 179 lb 6.4 oz (81.4 kg)    Gen: NAD, alert, cooperative with exam, NCAT EYES: EOMI, no conjunctival injection, or no icterus CV: Irregular, normal rate, normal S1/S2 Resp: CTABL, no wheezes, normal WOB Ext: trace to 1+ pitting edema at the ankles, warm Neuro: Alert and oriented  Assessment & Plan:  [name] was seen today for follow-up INR.  Diagnoses and all orders for this visit:  Paroxysmal atrial fibrillation ([place]) Has gone back and forth between total weekly dose of 22.5 mg and 25 mg of warfarin with 5mg  tablets with INR being too low then too high.  Discussed with patient.  We will have her stop the 5 mg tablets, new prescription for 3 mg tablets of warfarin given her total weekly dose of 24 mg of warfarin.  Daily dosing chart given to patient. She was able to speak back to plan to me, will get rid of the 5 mg tablets.. -     CoaguChek XS/INR Waived -     warfarin (COUMADIN) 3 MG tablet; Take as directed 1-1.5 tablets a day  Encounter for therapeutic drug monitoring -     warfarin (COUMADIN) 3 MG tablet; Take as directed 1-1.5 tablets a day   Follow up plan: Return in about 2 weeks (around  07/11/2017). Assunta Found, MD [place]

## 2017-07-06 NOTE — Progress Notes (Signed)
HPI The patient presents for evaluation of atrial fibrillation and nonischemic cardiomyopathy.  She has a mildly reduced ejection fraction of 40% in 04/2016.   Since I last saw her she says she feels better.  She stopped amiodarone Lasix and metoprolol.  She is not having any of the nausea that she was having.  She was having stomach complaints and just did not feel well.  Still feels better now.  She does not notice any palpitations in particular.  She is not having any presyncope or syncope.  She has no chest pressure, neck or arm discomfort.  She has no new shortness of breath, PND or orthopnea.  She is bothered by her shoulder and is still thinking of having this treated surgically.   Allergies  Allergen Reactions  . Bactrim [Sulfamethoxazole-Trimethoprim]     Mouth sores  . Lipitor [Atorvastatin Calcium]     myalgia    Current Outpatient Medications  Medication Sig Dispense Refill  . alendronate (FOSAMAX) 70 MG tablet TAKE 1 TABLET ONCE A WEEK WITH A FULL GLASS OF WATER ON AN EMPTY STOMACH 12 tablet 1  . linaclotide (LINZESS) 145 MCG CAPS capsule Take 1 capsule (145 mcg total) by mouth daily before breakfast. 90 capsule 1  . meclizine (ANTIVERT) 25 MG tablet TAKE 1 TABLET (25 MG TOTAL) BY MOUTH 3 (THREE) TIMES DAILY AS NEEDED FOR DIZZINESS. 30 tablet 0  . warfarin (COUMADIN) 3 MG tablet Take as directed 1-1.5 tablets a day 90 tablet 1   No current facility-administered medications for this visit.     Past Medical History:  Diagnosis Date  . Allergy   . Atrial fibrillation (HCC)    a. coumadin;  b. Amiodarone  . Cataract   . Chest pain 03/2012   a. Lex MV 10/13:  EF 64%, dist ant and apical defect sugg of soft tissue atten, no ischemia  . Chronic systolic heart failure ([place])   . Depression   . GERD (gastroesophageal reflux disease)   . HLD (hyperlipidemia)   . Incontinence of urine   . Mild mitral regurgitation   . MS (multiple sclerosis) ([place])   . NICM (nonischemic  cardiomyopathy) ([place])    a. neg CLite in 2003;  b. EF 40-45% in past;   c.  Echo 12/11: EF 35-40%, mild MR, mild LAE, mild RAE, small pericardial effusion   . Osteoarthritis   . Osteoporosis   . Stasis ulcer ([place])   . Uterine prolapse   . Vaginal prolapse without uterine prolapse   . Varicose veins     Past Surgical History:  Procedure Laterality Date  . ANTERIOR AND POSTERIOR VAGINAL REPAIR W/ SACROSPINOUS LIGAMENT SUSPENSION  2016   [facility]  . BLADDER SURGERY    . cataract surgery Bilateral   . EYE SURGERY    . INGUINAL HERNIA REPAIR     right  . LIPOMA EXCISION     h/o removed from upper back x 2    ROS:    As stated in the HPI and negative for all other systems.  PHYSICAL EXAM BP (!) 142/78   Pulse 78   Ht 5\' 10"  (1.778 m)   Wt 175 lb (79.4 kg)   SpO2 98%   BMI 25.11 kg/m   GENERAL:  Well appearing NECK:  No jugular venous distention, waveform within normal limits, carotid upstroke brisk and symmetric, no bruits, no thyromegaly LUNGS:  Clear to auscultation bilaterally CHEST:  Unremarkable HEART:  PMI not displaced or sustained,S1  and S2 within normal limits, no S3, no clicks, no rubs, no murmurs, irregular ABD:  Flat, positive bowel sounds normal in frequency in pitch, no bruits, no rebound, no guarding, no midline pulsatile mass, no hepatomegaly, no splenomegaly EXT:  2 plus pulses throughout, no edema, no cyanosis no clubbing, dependent rubor and varicose veins  EKG:  NA  Lab Results  Component Value Date   TSH 1.900 03/18/2017   ALT 17 04/15/2017   AST 20 04/15/2017   ALKPHOS 98 04/15/2017   BILITOT 0.4 04/15/2017   PROT 5.9 (L) 04/15/2017   ALBUMIN 4.0 04/15/2017    ASSESSMENT AND PLAN  DYSPNEA:   She has not wanted a pulmonary consult.  When I walked around the office previously her saturation was in the 90% range.  She today does not think she has any significant dyspnea no further evaluation is indicated.   CARDIOMYOPATHY: This is mildly reduced.   However, she is been completely intolerant of medications.  She does much better with fewer medications and so she will remain off the meds as listed.  ATRIAL FIB:  [name] has a CHA2DS2 - VASc score of 3 with a risk of stroke of 3.2%.  Her rate was controlled on a recent Holter.  No change in therapy is indicated.Marland Kitchen

## 2017-07-09 ENCOUNTER — Ambulatory Visit (INDEPENDENT_AMBULATORY_CARE_PROVIDER_SITE_OTHER): Payer: Medicare Other | Admitting: Cardiology

## 2017-07-09 ENCOUNTER — Ambulatory Visit: Payer: Medicare Other | Admitting: Cardiology

## 2017-07-09 ENCOUNTER — Encounter: Payer: Self-pay | Admitting: Cardiology

## 2017-07-09 VITALS — BP 142/78 | HR 78 | Ht 70.0 in | Wt 175.0 lb

## 2017-07-09 DIAGNOSIS — I4821 Permanent atrial fibrillation: Secondary | ICD-10-CM

## 2017-07-09 DIAGNOSIS — I420 Dilated cardiomyopathy: Secondary | ICD-10-CM

## 2017-07-09 DIAGNOSIS — I482 Chronic atrial fibrillation: Secondary | ICD-10-CM

## 2017-07-09 NOTE — Patient Instructions (Signed)

## 2017-07-11 ENCOUNTER — Ambulatory Visit (INDEPENDENT_AMBULATORY_CARE_PROVIDER_SITE_OTHER): Payer: Medicare Other | Admitting: Pediatrics

## 2017-07-11 ENCOUNTER — Encounter: Payer: Self-pay | Admitting: Pediatrics

## 2017-07-11 VITALS — BP 164/110 | HR 75 | Temp 97.0°F | Ht 70.0 in | Wt 185.6 lb

## 2017-07-11 DIAGNOSIS — I482 Chronic atrial fibrillation: Secondary | ICD-10-CM

## 2017-07-11 DIAGNOSIS — I10 Essential (primary) hypertension: Secondary | ICD-10-CM

## 2017-07-11 DIAGNOSIS — Z5181 Encounter for therapeutic drug level monitoring: Secondary | ICD-10-CM

## 2017-07-11 DIAGNOSIS — I4821 Permanent atrial fibrillation: Secondary | ICD-10-CM

## 2017-07-11 DIAGNOSIS — R609 Edema, unspecified: Secondary | ICD-10-CM

## 2017-07-11 LAB — COAGUCHEK XS/INR WAIVED
INR: 3.6 — ABNORMAL HIGH (ref 0.9–1.1)
Prothrombin Time: 43.7 s

## 2017-07-11 MED ORDER — HYDROCHLOROTHIAZIDE 12.5 MG PO TABS: 12.5000 mg | ORAL_TABLET | Freq: Every day | ORAL | 3 refills | Status: DC

## 2017-07-11 MED ORDER — LISINOPRIL 10 MG PO TABS: 10.0000 mg | ORAL_TABLET | Freq: Every day | ORAL | 3 refills | Status: DC

## 2017-07-11 NOTE — Progress Notes (Signed)
  Subjective:   Patient ID: Virginia Young, female    DOB: Oct 06, 1940, 77 y.o.   MRN: [id number] CC: Follow-up (INR)  HPI: Virginia Young is a 77 y.o. female presenting for Follow-up (INR)  Decreased dose from last visit. Says she has been adhering to the dosing per calendar given at last visit Has not missed any doses She does not use a pillbox but keeps her pills in certain places in her home so that she remembers whether or not she takes it every day  Was recently taken off her amiodarone  Has had a headache for the last few days More swelling in her lower extremities  Relevant past medical, surgical, family and social history reviewed. Allergies and medications reviewed and updated. Social History   Tobacco Use  Smoking Status Never Smoker  Smokeless Tobacco Never Used   ROS: Per HPI   Objective:    BP (!) 164/110   Pulse 75   Temp (!) 97 F (36.1 C) (Oral)   Ht 5\' 10"  (1.778 m)   Wt 185 lb 9.6 oz (84.2 kg)   BMI 26.63 kg/m   Wt Readings from Last 3 Encounters:  07/11/17 185 lb 9.6 oz (84.2 kg)  07/09/17 175 lb (79.4 kg)  06/27/17 175 lb (79.4 kg)    Gen: NAD, alert, cooperative with exam, NCAT EYES: EOMI, no conjunctival injection, or no icterus ENT:  TMs pearly gray b/l, OP without erythema LYMPH: no cervical LAD CV: NRRR, normal S1/S2, no murmur, distal pulses 2+ b/l Resp: CTABL, no wheezes, normal WOB Abd: +BS, soft, NTND. no guarding or organomegaly Ext: No edema, warm Neuro: Alert and oriented, strength equal b/l UE and LE, coordination grossly normal MSK: normal muscle bulk  Assessment & Plan:  Virginia Young was seen today for follow-up multiple medical problems   Diagnoses and all orders for this visit:  Permanent atrial fibrillation (HCC) INR elevated at 3.6 despite decrease total weekly dose from 25 mg of warfarin to 24 mg of warfarin last visit 2 weeks ago.  Prior to this she had been on 22.5 mg of warfarin in a week with repeatedly  subtherapeutic INRs. Will have patient hold tonight's dose, return in 2 weeks for recheck INR.  Continue current dosing. -     CoaguChek XS/INR Waived  Swelling Start below, take as needed for swelling in lower legs in the morning -     hydrochlorothiazide (HYDRODIURIL) 12.5 MG tablet; Take 1 tablet (12.5 mg total) by mouth daily.  Encounter for therapeutic drug monitoring  Essential hypertension Significantly elevated today.  Some headaches off and on at home.  No headache or chest pain today.  Only medication change has been recent stop of amiodarone.  Will start below, patient to check her blood pressures at home, let me know if regularly elevated >140 or >90. Needs BMP next visit. -     lisinopril (PRINIVIL,ZESTRIL) 10 MG tablet; Take 1 tablet (10 mg total) by mouth daily.   Follow up plan: Return in about 2 weeks (around 07/25/2017). Assunta Found, MD [place]

## 2017-07-11 NOTE — Patient Instructions (Addendum)
Take hydrochlorothiazide for swelling every morning  Start lisinopril every day for blood pressure control  When you check you blood pressure at home, write it down so you can bring to next office visit

## 2017-07-16 ENCOUNTER — Telehealth: Payer: Self-pay | Admitting: *Deleted

## 2017-07-16 NOTE — Telephone Encounter (Signed)
Patient with diagnosis of atrial fibrillation on warfarin for anticoagulation.    Procedure: shoulder - reverse TSA, right STR,  Hand CTR Date of procedure: TBD  CHADS2-VASc score of  5 (CHF, HTN, AGE x 2, female)  CrCl 64.6 Platelet count 244  Per office protocol, patient can hold warfarin for 5 days prior to procedure.   Patient will not need bridging with Lovenox (enoxaparin) around procedure.  If not bridging, patient should restart warfarin on the evening of procedure or day after, at discretion of procedure MD

## 2017-07-16 NOTE — Telephone Encounter (Signed)
Routing to coumadin.  Also routing to Dr. Percival Spanish - he saw her about a week ago - for his input.

## 2017-07-16 NOTE — Telephone Encounter (Signed)
   [facility] Pre-operative Risk Assessment    Request for surgical clearance:  1. What type of surgery is being performed? Right shoulder: right reverse TSA, right STR, right hand: CTR ([facility] with interscalene block)   2. When is this surgery scheduled? pending   3. What type of clearance is required (medical clearance vs. Pharmacy clearance to hold med vs. Both)? Both  4. Are there any medications that need to be held prior to surgery and how long?Warfarin   5. Practice name and name of physician performing surgery? [facility] Dr. Veverly Fells   6. What is your office phone and fax number? [phone]   7. Anesthesia type (None, local, MAC, general) ?    Virginia Young Virginia Young 07/16/2017, 12:31 PM  _________________________________________________________________   (provider comments below)

## 2017-07-21 NOTE — Telephone Encounter (Signed)
Agree.  No bridging.

## 2017-07-21 NOTE — Telephone Encounter (Signed)
Called and left a detailed message on patient's VM letting her know that we have cleared her to hold her coumadin for 5 days prior to procedure and that she does not require a lovenox bridge. Also left the message stating that she will need to resume Coumadin either the night of the procedure or the day after at the surgeon's discretion and that we will fax over the information to Dr. Veverly Fells at [facility].

## 2017-07-21 NOTE — Telephone Encounter (Signed)
Clearance has been sent via Epic to Dr. Veverly Fells at [facility].

## 2017-07-21 NOTE — Telephone Encounter (Signed)
Routing to the pool

## 2017-07-25 ENCOUNTER — Ambulatory Visit (INDEPENDENT_AMBULATORY_CARE_PROVIDER_SITE_OTHER): Payer: Medicare Other | Admitting: Pediatrics

## 2017-07-25 ENCOUNTER — Encounter: Payer: Self-pay | Admitting: Pediatrics

## 2017-07-25 VITALS — BP 131/87 | HR 67 | Temp 97.5°F | Ht 70.0 in | Wt 187.2 lb

## 2017-07-25 DIAGNOSIS — Z5181 Encounter for therapeutic drug level monitoring: Secondary | ICD-10-CM

## 2017-07-25 DIAGNOSIS — J069 Acute upper respiratory infection, unspecified: Secondary | ICD-10-CM

## 2017-07-25 DIAGNOSIS — K5900 Constipation, unspecified: Secondary | ICD-10-CM

## 2017-07-25 DIAGNOSIS — I4891 Unspecified atrial fibrillation: Secondary | ICD-10-CM | POA: Diagnosis not present

## 2017-07-25 LAB — COAGUCHEK XS/INR WAIVED
INR: 2.1 — ABNORMAL HIGH (ref 0.9–1.1)
Prothrombin Time: 25.3 s

## 2017-07-25 MED ORDER — LINACLOTIDE 145 MCG PO CAPS: 290.0000 ug | ORAL_CAPSULE | Freq: Every day | ORAL | 1 refills | Status: DC

## 2017-07-25 NOTE — Patient Instructions (Signed)
Can try flonase nasal steroid or salt water spray for nasal congestion

## 2017-07-25 NOTE — Progress Notes (Signed)
  Subjective:   Patient ID: Virginia Young, female    DOB: January 24, 1941, 77 y.o.   MRN: [id number] CC: Follow-up (2 week INR)  HPI: Virginia Young is a 77 y.o. female presenting for Follow-up (2 week INR)  Afib: INR 2.1 at current dosing Has upcoming shoulder surgery, not yet scheduled  Continues to have some constipation Sometimes linzess 131mcg helps, sometimes not  trying to eat regular amount of fiber including fruits and veg  Some nasal congestion for the last few days, sometimes with scratchy throat Feeling better today No fevers, normal appetite No coughing  Relevant past medical, surgical, family and social history reviewed. Allergies and medications reviewed and updated. Social History   Tobacco Use  Smoking Status Never Smoker  Smokeless Tobacco Never Used   ROS: Per HPI   Objective:    BP 131/87   Pulse 67   Temp (!) 97.5 F (36.4 C) (Oral)   Ht 5\' 10"  (1.778 m)   Wt 187 lb 3.2 oz (84.9 kg)   BMI 26.86 kg/m   Wt Readings from Last 3 Encounters:  07/25/17 187 lb 3.2 oz (84.9 kg)  07/11/17 185 lb 9.6 oz (84.2 kg)  07/09/17 175 lb (79.4 kg)    Gen: NAD, alert, cooperative with exam, NCAT EYES: EOMI, no conjunctival injection, or no icterus ENT:  TMs pearly gray b/l, OP without erythema LYMPH: no cervical LAD CV: NRRR, normal S1/S2, no murmur, distal pulses 2+ b/l Resp: CTABL, no wheezes, normal WOB Abd: +BS, soft Ext: No edema, warm Neuro: Alert and oriented MSK: normal muscle bulk  Assessment & Plan:  Virginia Young was seen today for follow-up med problems  Diagnoses and all orders for this visit:  Atrial fibrillation, unspecified type (HCC) INR at goal, continue current dosing -     CoaguChek XS/INR Waived  Constipation, unspecified constipation type Take 298mcg daily -     linaclotide (LINZESS) 145 MCG CAPS capsule; Take 2 capsules (290 mcg total) by mouth daily before breakfast.  Encounter for therapeutic drug monitoring  Acute  URI Symptom care discussed, return precautions  Follow up plan: 4 weeks for INR Assunta Found, MD [place]

## 2017-07-30 ENCOUNTER — Telehealth: Payer: Self-pay | Admitting: Pediatrics

## 2017-07-30 DIAGNOSIS — I10 Essential (primary) hypertension: Secondary | ICD-10-CM

## 2017-07-30 MED ORDER — LOSARTAN POTASSIUM 50 MG PO TABS: 50.0000 mg | ORAL_TABLET | Freq: Every day | ORAL | 3 refills | Status: DC

## 2017-07-30 NOTE — Telephone Encounter (Signed)
Please advise on patient's concerns.

## 2017-07-30 NOTE — Telephone Encounter (Signed)
Aware. 

## 2017-07-30 NOTE — Telephone Encounter (Signed)
Could be from the lisinopril. Also possible could be from URI/sinus drainage. Reasonable to STOP the lisinopril, she should throw it away. Start losartan instead, take once a day in the morning. I sent that in to her pharmacy. Check blood pressures at home, or come by clinic to check in 1 week. Let me know if >140 or >90.

## 2017-08-05 ENCOUNTER — Ambulatory Visit: Payer: Medicare Other | Admitting: Pediatrics

## 2017-08-06 ENCOUNTER — Ambulatory Visit: Payer: Medicare Other | Admitting: Pediatrics

## 2017-08-08 ENCOUNTER — Encounter: Payer: Self-pay | Admitting: Family Medicine

## 2017-08-08 ENCOUNTER — Ambulatory Visit (INDEPENDENT_AMBULATORY_CARE_PROVIDER_SITE_OTHER): Payer: Medicare Other | Admitting: Family Medicine

## 2017-08-08 ENCOUNTER — Other Ambulatory Visit: Payer: Self-pay | Admitting: *Deleted

## 2017-08-08 ENCOUNTER — Ambulatory Visit (INDEPENDENT_AMBULATORY_CARE_PROVIDER_SITE_OTHER): Payer: Medicare Other

## 2017-08-08 VITALS — BP 135/87 | HR 96 | Temp 97.4°F | Resp 22 | Ht 70.0 in | Wt 182.6 lb

## 2017-08-08 DIAGNOSIS — R058 Other specified cough: Secondary | ICD-10-CM

## 2017-08-08 DIAGNOSIS — R05 Cough: Secondary | ICD-10-CM | POA: Diagnosis not present

## 2017-08-08 DIAGNOSIS — I4891 Unspecified atrial fibrillation: Secondary | ICD-10-CM

## 2017-08-08 MED ORDER — BENZONATATE 200 MG PO CAPS: 200.0000 mg | ORAL_CAPSULE | Freq: Two times a day (BID) | ORAL | 0 refills | Status: DC | PRN

## 2017-08-08 NOTE — Patient Instructions (Signed)
I did not see any pneumonias on your chest x-ray.  It looked unchanged from July's chest x-ray.  I have prescribed you a cough medication that you can take twice a day if needed for coughing.  This should be safe with the rest of your medicines.  You may continue Flonase and Claritin to reduce your sinus drainage.  As we discussed, I do recommend that you resume use of your blood pressure/ heart medicines.  It appears that you have a viral upper respiratory infection (cold).  Cold symptoms can last up to 2 weeks.  I recommend that you only use cold medications that are safe in high blood pressure like Coricidin (generic is fine).  Other cold medications can increase your blood pressure.    - Get plenty of rest and drink plenty of fluids. - Try to breathe moist air. Use a cold mist humidifier. - Consume warm fluids (soup or tea) to provide relief for a stuffy nose and to loosen phlegm. - For nasal stuffiness, try saline nasal spray or a Neti Pot.  Afrin nasal spray can also be used but this product should not be used longer than 3 days or it will cause rebound nasal stuffiness (worsening nasal congestion). - For sore throat pain relief: suck on throat lozenges, hard candy or popsicles; gargle with warm salt water (1/4 tsp. salt per 8 oz. of water); and eat soft, bland foods. - Eat a well-balanced diet. If you cannot, ensure you are getting enough nutrients by taking a daily multivitamin. - Avoid dairy products, as they can thicken phlegm. - Avoid alcohol, as it impairs your body's immune system.  CONTACT YOUR DOCTOR IF YOU EXPERIENCE ANY OF THE FOLLOWING: - High fever - Ear pain - Sinus-type headache - Unusually severe cold symptoms - Cough that gets worse while other cold symptoms improve - Flare up of any chronic lung problem, such as asthma - Your symptoms persist longer than 2 weeks

## 2017-08-08 NOTE — Addendum Note (Signed)
Addended by: Wardell Heath on: 08/08/2017 11:49 AM   Modules accepted: Orders

## 2017-08-08 NOTE — Progress Notes (Signed)
Subjective: CC: cough PCP: Eustaquio Maize, MD RKY:HCWCBJ B Giammona is a 77 y.o. female presenting to clinic today for:  1. Cough Patient reports a 5-6-week history of cough.  She reports associated rhinorrhea and throat irritation.  Denies fevers, worsening shortness of breath, lower extremity edema.  She notes that she has been using an over-the-counter cough medication but this raised her blood pressure so she discontinued.  She reports that over the last few days she has been using Claritin and Flonase with little improvement.  Her ACE-I was actually changed to Cozaar recently as they thought this might be induced by medication.  She notes no improvement after changing medication.  She notes that she actually could not even tolerate taking her medications this morning because she feels sick on her stomach from them.   ROS: Per HPI  Allergies  Allergen Reactions  . Bactrim [Sulfamethoxazole-Trimethoprim]     Mouth sores  . Lipitor [Atorvastatin Calcium]     myalgia  . Lisinopril     cough   Past Medical History:  Diagnosis Date  . Allergy   . Atrial fibrillation (HCC)    a. coumadin;  b. Amiodarone  . Cataract   . Chest pain 03/2012   a. Lex MV 10/13:  EF 64%, dist ant and apical defect sugg of soft tissue atten, no ischemia  . Chronic systolic heart failure ([place])   . Depression   . GERD (gastroesophageal reflux disease)   . HLD (hyperlipidemia)   . Incontinence of urine   . Mild mitral regurgitation   . MS (multiple sclerosis) ([place])   . NICM (nonischemic cardiomyopathy) ([place])    a. neg CLite in 2003;  b. EF 40-45% in past;   c.  Echo 12/11: EF 35-40%, mild MR, mild LAE, mild RAE, small pericardial effusion   . Osteoarthritis   . Osteoporosis   . Stasis ulcer ([place])   . Uterine prolapse   . Vaginal prolapse without uterine prolapse   . Varicose veins     Current Outpatient Medications:  .  alendronate (FOSAMAX) 70 MG tablet, TAKE 1 TABLET ONCE A WEEK WITH A FULL  GLASS OF WATER ON AN EMPTY STOMACH, Disp: 12 tablet, Rfl: 1 .  hydrochlorothiazide (HYDRODIURIL) 12.5 MG tablet, Take 1 tablet (12.5 mg total) by mouth daily., Disp: 90 tablet, Rfl: 3 .  linaclotide (LINZESS) 145 MCG CAPS capsule, Take 2 capsules (290 mcg total) by mouth daily before breakfast., Disp: 180 capsule, Rfl: 1 .  losartan (COZAAR) 50 MG tablet, Take 1 tablet (50 mg total) by mouth daily., Disp: 90 tablet, Rfl: 3 .  meclizine (ANTIVERT) 25 MG tablet, TAKE 1 TABLET (25 MG TOTAL) BY MOUTH 3 (THREE) TIMES DAILY AS NEEDED FOR DIZZINESS., Disp: 30 tablet, Rfl: 0 .  warfarin (COUMADIN) 3 MG tablet, Take as directed 1-1.5 tablets a day, Disp: 90 tablet, Rfl: 1 Social History   Socioeconomic History  . Marital status: Divorced    Spouse name: Not on file  . Number of children: 3  . Years of education: 51  . Highest education level: Not on file  Social Needs  . Financial resource strain: Not on file  . Food insecurity - worry: Not on file  . Food insecurity - inability: Not on file  . Transportation needs - medical: Not on file  . Transportation needs - non-medical: Not on file  Occupational History  . Occupation: retired    Fish farm manager: RETIRED  Tobacco Use  . Smoking status:  Never Smoker  . Smokeless tobacco: Never Used  Substance and Sexual Activity  . Alcohol use: No  . Drug use: No  . Sexual activity: Not Currently  Other Topics Concern  . Not on file  Social History Narrative   DESIGNATED PARTY RELEASE ON FILE. Fleet Contras, April 26, 2010 10:09 AM.      Lives at home alone   Right-handed   Caffeine: 1 cup of coffee daily   Family History  Problem Relation Age of Onset  . Diabetes Father 106       diabetes and syncope  . Heart attack Father   . Heart attack Sister   . Heart attack Brother        all 5 brothers had MIs  . Stroke Brother   . Cancer Sister        bone  . Diabetes Sister   . Cancer Sister   . Diabetes Sister   . Heart attack Brother   .  Dementia Mother   . Dementia Brother   . CAD Unknown   . Diabetes Daughter   . Colon cancer Neg Hx   . Neuropathy Neg Hx     Objective: Office vital signs reviewed. BP 135/87   Pulse 96   Temp (!) 97.4 F (36.3 C) (Oral)   Resp (!) 22   Ht 5\' 10"  (1.778 m)   Wt 182 lb 9.6 oz (82.8 kg)   SpO2 100%   BMI 26.20 kg/m   Physical Examination:  General: Awake, alert, nontoxic appearing, No acute distress HEENT: Normal    Neck: No masses palpated. No lymphadenopathy; no JVD    Nose: nasal turbinates moist, clear nasal discharge    Throat: moist mucus membranes, no erythema.  Airway is patent Cardio: irregularly irregular, S1S2 heard, no murmurs appreciated Pulm: clear to auscultation bilaterally, no wheezes, rhonchi or rales; normal work of breathing on room air MSK: large scoliotic curve noted to the right Ext: No edema.  Many varicose veins noted.  No results found.   Assessment/ Plan: 77 y.o. female   1. Cough present for greater than 3 weeks Pulmonary exam unremarkable.  Patient is afebrile with normal work of breathing on room air and normal pulse oxygenation on room.  Personal review of chest x-ray did not demonstrate any acute findings.  Chest x-ray is relatively unchanged from her July x-ray.  I reviewed her note from February 1, PCP noted no cough during that exam.  There is no evidence of fluid overload on her exam.  I agree that she should not use over-the-counter cough and cold medications if she can avoid as many of these will raise her blood pressure.  I did recommend that she resume use of her blood pressure medications.  I prescribed her Tessalon Perles to use twice daily as needed for cough.  Home care instructions reviewed with patient and reasons for reevaluation discussed.  Will contact patient once formal review of the chest x-ray has been performed by radiology.  For now, no antibiotics prescribed. - DG Chest 2 View; Future   Orders Placed This Encounter    Procedures  . DG Chest 2 View    Standing Status:   Future    Number of Occurrences:   1    Standing Expiration Date:   10/07/2018    Order Specific Question:   Reason for Exam (SYMPTOM  OR DIAGNOSIS REQUIRED)    Answer:   cough x5 weeks    Order Specific Question:  Preferred imaging location?    Answer:   Internal    Order Specific Question:   Radiology Contrast Protocol - do NOT remove file path    Answer:   [email or web address]   Meds ordered this encounter  Medications  . benzonatate (TESSALON) 200 MG capsule    Sig: Take 1 capsule (200 mg total) by mouth 2 (two) times daily as needed for cough.    Dispense:  20 capsule    Refill:  [place], DO [place] [phone]

## 2017-08-11 ENCOUNTER — Encounter: Payer: Self-pay | Admitting: Pediatrics

## 2017-08-12 NOTE — H&P (Signed)
Virginia Young is an 77 y.o. female.    Chief Complaint: right shoulder and hand pain  HPI: Pt is a 77 y.o. female complaining of right shoulder pain for multiple years. Pain had continually increased since the beginning. X-rays in the clinic show end-stage arthritic changes of the right shoudler. Pt has tried various conservative treatments which have failed to alleviate their symptoms, including injections and therapy. Various options are discussed with the patient. Risks, benefits and expectations were discussed with the patient. Patient understand the risks, benefits and expectations and wishes to proceed with surgery.   PCP:  Eustaquio Maize, MD  D/C Plans: Home  PMH: Past Medical History:  Diagnosis Date  . Allergy   . Atrial fibrillation (HCC)    a. coumadin;  b. Amiodarone  . Cataract   . Chest pain 03/2012   a. Lex MV 10/13:  EF 64%, dist ant and apical defect sugg of soft tissue atten, no ischemia  . Chronic systolic heart failure ([place])   . Depression   . GERD (gastroesophageal reflux disease)   . HLD (hyperlipidemia)   . Incontinence of urine   . Mild mitral regurgitation   . MS (multiple sclerosis) ([place])   . NICM (nonischemic cardiomyopathy) ([place])    a. neg CLite in 2003;  b. EF 40-45% in past;   c.  Echo 12/11: EF 35-40%, mild MR, mild LAE, mild RAE, small pericardial effusion   . Osteoarthritis   . Osteoporosis   . Stasis ulcer ([place])   . Uterine prolapse   . Vaginal prolapse without uterine prolapse   . Varicose veins     PSH: Past Surgical History:  Procedure Laterality Date  . ANTERIOR AND POSTERIOR VAGINAL REPAIR W/ SACROSPINOUS LIGAMENT SUSPENSION  2016   [facility]  . BLADDER SURGERY    . cataract surgery Bilateral   . EYE SURGERY    . INGUINAL HERNIA REPAIR     right  . LIPOMA EXCISION     h/o removed from upper back x 2    Social History:  reports that  has never smoked. she has never used smokeless tobacco. She reports that she does not drink  alcohol or use drugs.  Allergies:  Allergies  Allergen Reactions  . Bactrim [Sulfamethoxazole-Trimethoprim]     Mouth sores  . Lipitor [Atorvastatin Calcium]     myalgia  . Lisinopril     cough    Medications: No current facility-administered medications for this encounter.    Current Outpatient Medications  Medication Sig Dispense Refill  . alendronate (FOSAMAX) 70 MG tablet TAKE 1 TABLET ONCE A WEEK WITH A FULL GLASS OF WATER ON AN EMPTY STOMACH 12 tablet 1  . benzonatate (TESSALON) 200 MG capsule Take 1 capsule (200 mg total) by mouth 2 (two) times daily as needed for cough. 20 capsule 0  . hydrochlorothiazide (HYDRODIURIL) 12.5 MG tablet Take 1 tablet (12.5 mg total) by mouth daily. 90 tablet 3  . linaclotide (LINZESS) 145 MCG CAPS capsule Take 2 capsules (290 mcg total) by mouth daily before breakfast. 180 capsule 1  . losartan (COZAAR) 50 MG tablet Take 1 tablet (50 mg total) by mouth daily. 90 tablet 3  . meclizine (ANTIVERT) 25 MG tablet TAKE 1 TABLET (25 MG TOTAL) BY MOUTH 3 (THREE) TIMES DAILY AS NEEDED FOR DIZZINESS. 30 tablet 0  . warfarin (COUMADIN) 3 MG tablet Take as directed 1-1.5 tablets a day 90 tablet 1    No results found for this  or any previous visit (from the past 48 hour(s)). No results found.  ROS: Pain with rom of the right upper extremity  Physical Exam:  Alert and oriented 77 y.o. female in no acute distress Cranial nerves 2-12 intact Cervical spine: full rom with no tenderness, nv intact distally Chest: active breath sounds bilaterally, no wheeze rhonchi or rales Heart: regular rate and rhythm, no murmur Abd: non tender non distended with active bowel sounds Hip is stable with rom  Right shoulder with limited rom and weakness Positive tinel's and phalen's signs No rashes or edema  Assessment/Plan Assessment: right shoulder cuff arthropathy and carpal tunnel syndrome  Plan: Patient will undergo a right reverse total and carpal tunnel  release by Dr. Veverly Fells at [facility]. Risks benefits and expectations were discussed with the patient. Patient understand risks, benefits and expectations and wishes to proceed.  Merla Riches PA-C, MPAS [facility] is now [employer] [address] [place], [place], [place] [address] Phone: [phone] [email or web address] Facebook  [employer]

## 2017-08-14 ENCOUNTER — Ambulatory Visit (INDEPENDENT_AMBULATORY_CARE_PROVIDER_SITE_OTHER): Payer: Medicare Other | Admitting: Internal Medicine

## 2017-08-15 NOTE — Pre-Procedure Instructions (Signed)
Virginia Young  08/15/2017      [id number] - [place], [place] [address] [place] [address] Phone: [phone] Fax: [phone]    Your procedure is scheduled on August 22, 2017.  Report to [facility] Admitting at 530 AM.  Call this number if you have problems the morning of surgery:  [phone]   Remember:  Do not eat food or drink liquids after midnight.  Take these medicines the morning of surgery with A SIP OF WATER linaclotide (linzess), meclizine (antivert)-if needed for dizziness.  Stop/Continue your coumadin as instructed by your doctor.  7 days prior to surgery STOP taking any Aspirin (unless otherwise instructed by your surgeon), Aleve, Naproxen, Ibuprofen, Motrin, Advil, Goody's, BC's, all herbal medications, fish oil, and all vitamins  Continue all other medications as instructed by your physician except follow the above medication instructions before surgery   Do not wear jewelry, make-up or nail polish.  Do not wear lotions, powders, or perfumes, or deodorant.  Do not shave 48 hours prior to surgery.    Do not bring valuables to the hospital.  [facility] is not responsible for any belongings or valuables.  Contacts, dentures or bridgework may not be worn into surgery.  Leave your suitcase in the car.  After surgery it may be brought to your room.  For patients admitted to the hospital, discharge time will be determined by your treatment team.  Patients discharged the day of surgery will not be allowed to drive home.   Special instructions:  [facility]- Preparing For Surgery  Before surgery, you can play an important role. Because skin is not sterile, your skin needs to be as free of germs as possible. You can reduce the number of germs on your skin by washing with CHG (chlorahexidine gluconate) Soap before surgery.  CHG is an antiseptic cleaner which kills germs and bonds with the skin to continue  killing germs even after washing.  Please do not use if you have an allergy to CHG or antibacterial soaps. If your skin becomes reddened/irritated stop using the CHG.  Do not shave (including legs and underarms) for at least 48 hours prior to first CHG shower. It is OK to shave your face.  Please follow these instructions carefully.   1. Shower the NIGHT BEFORE SURGERY and the MORNING OF SURGERY with CHG.   2. If you chose to wash your hair, wash your hair first as usual with your normal shampoo.  3. After you shampoo, rinse your hair and body thoroughly to remove the shampoo.  4. Use CHG as you would any other liquid soap. You can apply CHG directly to the skin and wash gently with a scrungie or a clean washcloth.   5. Apply the CHG Soap to your body ONLY FROM THE NECK DOWN.  Do not use on open wounds or open sores. Avoid contact with your eyes, ears, mouth and genitals (private parts). Wash Face and genitals (private parts)  with your normal soap.  6. Wash thoroughly, paying special attention to the area where your surgery will be performed.  7. Thoroughly rinse your body with warm water from the neck down.  8. DO NOT shower/wash with your normal soap after using and rinsing off the CHG Soap.  9. Pat yourself dry with a CLEAN TOWEL.  10. Wear CLEAN PAJAMAS to bed the night before surgery, wear comfortable clothes the morning of surgery  11.  Place CLEAN SHEETS on your bed the night of your first shower and DO NOT SLEEP WITH PETS.  Day of Surgery: Do not apply any deodorants/lotions. Please wear clean clothes to the hospital/surgery center.    Please read over the following fact sheets that you were given. Pain Booklet, Coughing and Deep Breathing, MRSA Information and Surgical Site Infection Prevention

## 2017-08-18 ENCOUNTER — Encounter (HOSPITAL_COMMUNITY)
Admission: RE | Admit: 2017-08-18 | Discharge: 2017-08-18 | Disposition: A | Discharge status: Home / Self Care | Payer: Medicare Other | Source: Ambulatory Visit | Attending: Orthopedic Surgery | Admitting: Orthopedic Surgery

## 2017-08-18 ENCOUNTER — Encounter (HOSPITAL_COMMUNITY): Payer: Self-pay

## 2017-08-18 DIAGNOSIS — M75101 Unspecified rotator cuff tear or rupture of right shoulder, not specified as traumatic: Secondary | ICD-10-CM | POA: Insufficient documentation

## 2017-08-18 DIAGNOSIS — G5601 Carpal tunnel syndrome, right upper limb: Secondary | ICD-10-CM | POA: Insufficient documentation

## 2017-08-18 DIAGNOSIS — Z01818 Encounter for other preprocedural examination: Secondary | ICD-10-CM | POA: Insufficient documentation

## 2017-08-18 LAB — BASIC METABOLIC PANEL
Anion gap: 8 (ref 5–15)
BUN: 8 mg/dL (ref 6–20)
CO2: 27 mmol/L (ref 22–32)
Calcium: 8.8 mg/dL — ABNORMAL LOW (ref 8.9–10.3)
Chloride: 102 mmol/L (ref 101–111)
Creatinine, Ser: 1.03 mg/dL — ABNORMAL HIGH (ref 0.44–1.00)
GFR calc Af Amer: 59 mL/min — ABNORMAL LOW (ref >60)
GFR calc non Af Amer: 51 mL/min — ABNORMAL LOW (ref >60)
Glucose, Bld: 98 mg/dL (ref 65–99)
Potassium: 4.1 mmol/L (ref 3.5–5.1)
Sodium: 137 mmol/L (ref 135–145)

## 2017-08-18 LAB — CBC
HCT: 43.6 % (ref 36.0–46.0)
Hemoglobin: 14.3 g/dL (ref 12.0–15.0)
MCH: 28.9 pg (ref 26.0–34.0)
MCHC: 32.8 g/dL (ref 30.0–36.0)
MCV: 88.1 fL (ref 78.0–100.0)
Platelets: 249 10*3/uL (ref 150–400)
RBC: 4.95 MIL/uL (ref 3.87–5.11)
RDW: 14.5 % (ref 11.5–15.5)
WBC: 8.1 10*3/uL (ref 4.0–10.5)

## 2017-08-18 LAB — SURGICAL PCR SCREEN
MRSA, PCR: NEGATIVE
Staphylococcus aureus: NEGATIVE

## 2017-08-18 LAB — PROTIME-INR
INR: 2
Prothrombin Time: 22.5 seconds — ABNORMAL HIGH (ref 11.4–15.2)

## 2017-08-18 LAB — APTT: aPTT: 36 seconds (ref 24–36)

## 2017-08-19 ENCOUNTER — Telehealth: Payer: Self-pay | Admitting: Pediatrics

## 2017-08-19 NOTE — Progress Notes (Addendum)
Anesthesia Chart Review: Patient is a 77 year old female scheduled for right reverse total shoulder arthroplasty, right carpal tunnel release on 08/22/2017 by Dr. [name].  History includes never smoker, non-ischemic cardiomyopathy, chronic systolic CHF, atrial fibrillation, mild mitral regurgitation, multiple sclerosis, hyperlipidemia, varicose veins, depression, osteoporosis, osteoarthritis, GERD.  - PCP is listed as Dr. Assunta Found at [facility]. She recently added lisinopril for HTN, but patient developed a cough. She tried switching to losartan, but cough persisted. F/U with Dr. Lajuana Ripple on 08/08/17 showed no pneumonia on CXR, so suspected viral URI. She recommended Coricidin HBP (or generic) and resume BP medications.   - Cardiologist is Dr. Minus Breeding. Last visit 07/09/17 for follow-up. He notes (in regards to cardiomyopathy), "she is been completely intolerant of medications.  She does much better with fewer medications and so she will remain off the meds as listed."  On 06/11/17, he wrote, "From a cardiovascular standpoint she is at some risk for cardiovascular complications because of her slightly reduced ejection fraction but this would not be prohibitive.  She need to have good volume management follow-up of her I's and O's.  However, no other change in her therapy other than holding her warfarin for 5 days prior to surgery and resuming per the surgeons."  - Pulmonologist is Dr. Marshell Garfinkel. Seen as new patient on 07/03/16 for evaluation of dyspnea. He wrote: "She may have asthma, reactive airway disease although it is unusual for her to develop this late in life. She does have symptoms of wheezing, dyspnea but FENO is low in the office today again against airway inflammation.Marland KitchenMarland KitchenHer recent PFTs were reviewed. They show very minimal obstructive disease with suggestion of small airway disease with reduction in mid flow rates. This is consistent with asthma as  noted above. She also has a marked reduction in diffusion capacity that is out of proportion to mild restrictive process. She'll need to get evaluated for amiodarone lung toxicity or other interstitial process. I'll schedule her for a high-resolution CT of the chest." Patient did not have a chest CT and has not been back to see pulmonology. (06/11/17 note from Dr. Percival Spanish indicates patient did not feel like she wanted to add another provider. She was able to walk around his office and keep sats above 90%. She was taken off amiodarone after 06/11/17 Holter monitor.)  Meds include Fosamax (not taking), HCTZ, Linzess, losartan (switched from lisinopril by Dr. Evette Doffing due to cough on 07/30/17; listed as not taking), Antivert, warfarin (stopping 5 days prior to surgery).  BP (!) 152/90 Comment: taken manually in right arm  Pulse 83   Temp (!) 36.3 C   Resp 20   Ht 5\' 10"  (1.778 m)   Wt 179 lb (81.2 kg)   SpO2 100%   BMI 25.68 kg/m   Prior BP readings: 135/87 08/08/17 131/87 07/25/17 164/110 07/11/17 (lisinopril added, but patient didn't tolerate due to possibly related cough) 142/78 07/09/17  EKG 06/11/17: Atrial fibrillation at 83 bpm, premature ventricular or aberrantly conducted complexes, left axis deviation, possible anterior infarct, age undetermined.  24 hour Holter monitor 06/11/17: Rate controlled afib. (Amiodarone was discontinued.)  Echo 05/08/16: Study Conclusions - Left ventricle: The cavity size was normal. There was mild   concentric hypertrophy. Systolic function was mildly to   moderately reduced. The estimated ejection fraction was in the   range of 40% to 45%. Diffuse hypokinesis. - Aorta: Aortic root dimension: 40 mm (ED). - Ascending aorta: The ascending aorta was  mildly dilated. - Mitral valve: There was mild regurgitation. - Pericardium, extracardiac: A trivial pericardial effusion was   identified posterior to the heart. Impressions: - Compared to the prior study,  there has been no significant   interval change.  Nuclear stress test 04/21/12: Overall Impression:  Probably normal stress nuclear study with a small, fixed, mild distal anterior and apical defect suggestive of soft tissue attenuation; no ischemia. LV Ejection Fraction: 64%.  LV Wall Motion:  NL LV Function; NL Wall Motion  CXR 08/08/17: IMPRESSION: Scoliosis. No edema or consolidation. There is aortic Atherosclerosis.  PFTs 06/18/16:  PFTs 06/18/16 FVC 2.86 [84%) FEV1 2.29 (99%) F/F 80 TLC 79% DLCO 43%  Minimal restriction, small airway disease Severe diffusion defect  Preoperative labs noted. Cr 1.03. Glucose 98. CBC WNL. INR 2.00. PTT 36. Patient will need a repeat PT/INR on the day of surgery.   Patient has medical and cardiac clearances. Most BP readings have been reasonable. It looks like she is only on HCTZ (intolerant to multiple medications). She never had her chest CT and did not go back to see pulmonology. She is off amiodarone. Dr. Percival Spanish documented on 07/09/17 that patient "has not wanted a pulmonary consult" and that sats in 05/2017 were > 90% during walk. Patient did not think that she had any significant dyspnea at that time. Anesthesia APP was not consulted during her PAT visit, but I did attempt to contact patient to inquire if any changes in her cardiopulmonary status and see if coughing had resolved. (The phone number listed did not have voice message set up and no one answered.). I reviewed available history with anesthesiologist Dr. Suella Broad. If unable to reach patient by phone, then she will be evaluated by her assigned anesthesiologist on the day of surgery. He/She can consider nebulizer treatment if indicated, but otherwise if cardiopulmonary status stable then it is anticipated that she can proceed as planned.   [facility]/Anesthesiology Phone [phone] 08/20/2017 1:08 PM

## 2017-08-21 NOTE — Anesthesia Preprocedure Evaluation (Addendum)
Anesthesia Evaluation  Patient identified by MRN, date of birth, ID band Patient awake    Reviewed: Allergy & Precautions, NPO status , Patient's Chart, lab work & pertinent test results  Airway Mallampati: II  TM Distance: >3 FB Neck ROM: Full    Dental no notable dental hx. (+) Teeth Intact, Dental Advisory Given   Pulmonary neg pulmonary ROS,    Pulmonary exam normal breath sounds clear to auscultation       Cardiovascular hypertension, +CHF  Normal cardiovascular exam+ dysrhythmias Atrial Fibrillation  Rhythm:Regular Rate:Normal  Hx of ischemic cardiomyopathy an EF 40-45%   Neuro/Psych    GI/Hepatic   Endo/Other    Renal/GU      Musculoskeletal   Abdominal   Peds  Hematology   Anesthesia Other Findings   Reproductive/Obstetrics                          Lab Results  Component Value Date   WBC 8.1 08/18/2017   HGB 14.3 08/18/2017   HCT 43.6 08/18/2017   MCV 88.1 08/18/2017   PLT 249 08/18/2017   Lab Results  Component Value Date   CREATININE 1.03 (H) 08/18/2017   BUN 8 08/18/2017   NA 137 08/18/2017   K 4.1 08/18/2017   CL 102 08/18/2017   CO2 27 08/18/2017    Anesthesia Physical Anesthesia Plan  ASA: III  Anesthesia Plan: Regional and General   Post-op Pain Management:    Induction: Intravenous  PONV Risk Score and Plan: Treatment may vary due to age or medical condition and Ondansetron  Airway Management Planned: Oral ETT  Additional Equipment:   Intra-op Plan:   Post-operative Plan: Extubation in OR  Informed Consent: I have reviewed the patients History and Physical, chart, labs and discussed the procedure including the risks, benefits and alternatives for the proposed anesthesia with the patient or authorized representative who has indicated his/her understanding and acceptance.   Dental advisory given  Plan Discussed with: CRNA  Anesthesia Plan  Comments:         Anesthesia Quick Evaluation

## 2017-08-22 ENCOUNTER — Encounter (HOSPITAL_COMMUNITY): Payer: Self-pay | Admitting: *Deleted

## 2017-08-22 ENCOUNTER — Ambulatory Visit: Payer: Medicare Other | Admitting: Pediatrics

## 2017-08-22 ENCOUNTER — Inpatient Hospital Stay (HOSPITAL_COMMUNITY)
Admission: RE | Admit: 2017-08-22 | Discharge: 2017-08-25 | DRG: 483 | Disposition: A | Discharge status: Skilled Nursing Facility | Payer: Medicare Other | Source: Ambulatory Visit | Attending: Orthopedic Surgery | Admitting: Orthopedic Surgery

## 2017-08-22 ENCOUNTER — Inpatient Hospital Stay (HOSPITAL_COMMUNITY): Payer: Medicare Other

## 2017-08-22 ENCOUNTER — Inpatient Hospital Stay (HOSPITAL_COMMUNITY): Payer: Medicare Other | Admitting: Vascular Surgery

## 2017-08-22 ENCOUNTER — Encounter (HOSPITAL_COMMUNITY)
Admission: RE | Disposition: A | Discharge status: Skilled Nursing Facility | Payer: Self-pay | Source: Ambulatory Visit | Attending: Orthopedic Surgery

## 2017-08-22 DIAGNOSIS — S4990XA Unspecified injury of shoulder and upper arm, unspecified arm, initial encounter: Secondary | ICD-10-CM | POA: Diagnosis not present

## 2017-08-22 DIAGNOSIS — Z7983 Long term (current) use of bisphosphonates: Secondary | ICD-10-CM | POA: Diagnosis not present

## 2017-08-22 DIAGNOSIS — I480 Paroxysmal atrial fibrillation: Secondary | ICD-10-CM | POA: Diagnosis not present

## 2017-08-22 DIAGNOSIS — G8911 Acute pain due to trauma: Secondary | ICD-10-CM | POA: Diagnosis not present

## 2017-08-22 DIAGNOSIS — R41841 Cognitive communication deficit: Secondary | ICD-10-CM | POA: Diagnosis not present

## 2017-08-22 DIAGNOSIS — N816 Rectocele: Secondary | ICD-10-CM | POA: Diagnosis not present

## 2017-08-22 DIAGNOSIS — I429 Cardiomyopathy, unspecified: Secondary | ICD-10-CM | POA: Diagnosis present

## 2017-08-22 DIAGNOSIS — I5022 Chronic systolic (congestive) heart failure: Secondary | ICD-10-CM | POA: Diagnosis present

## 2017-08-22 DIAGNOSIS — Z7901 Long term (current) use of anticoagulants: Secondary | ICD-10-CM

## 2017-08-22 DIAGNOSIS — E871 Hypo-osmolality and hyponatremia: Secondary | ICD-10-CM | POA: Diagnosis present

## 2017-08-22 DIAGNOSIS — F329 Major depressive disorder, single episode, unspecified: Secondary | ICD-10-CM | POA: Diagnosis not present

## 2017-08-22 DIAGNOSIS — M19211 Secondary osteoarthritis, right shoulder: Secondary | ICD-10-CM | POA: Diagnosis present

## 2017-08-22 DIAGNOSIS — I110 Hypertensive heart disease with heart failure: Secondary | ICD-10-CM | POA: Diagnosis present

## 2017-08-22 DIAGNOSIS — M19011 Primary osteoarthritis, right shoulder: Secondary | ICD-10-CM | POA: Diagnosis not present

## 2017-08-22 DIAGNOSIS — G8918 Other acute postprocedural pain: Secondary | ICD-10-CM | POA: Diagnosis not present

## 2017-08-22 DIAGNOSIS — M1990 Unspecified osteoarthritis, unspecified site: Secondary | ICD-10-CM | POA: Diagnosis not present

## 2017-08-22 DIAGNOSIS — R262 Difficulty in walking, not elsewhere classified: Secondary | ICD-10-CM | POA: Diagnosis not present

## 2017-08-22 DIAGNOSIS — M6281 Muscle weakness (generalized): Secondary | ICD-10-CM | POA: Diagnosis not present

## 2017-08-22 DIAGNOSIS — I4891 Unspecified atrial fibrillation: Secondary | ICD-10-CM | POA: Diagnosis present

## 2017-08-22 DIAGNOSIS — E785 Hyperlipidemia, unspecified: Secondary | ICD-10-CM | POA: Diagnosis not present

## 2017-08-22 DIAGNOSIS — Z96611 Presence of right artificial shoulder joint: Secondary | ICD-10-CM

## 2017-08-22 DIAGNOSIS — Z4889 Encounter for other specified surgical aftercare: Secondary | ICD-10-CM | POA: Diagnosis not present

## 2017-08-22 DIAGNOSIS — M75101 Unspecified rotator cuff tear or rupture of right shoulder, not specified as traumatic: Secondary | ICD-10-CM | POA: Diagnosis present

## 2017-08-22 DIAGNOSIS — K219 Gastro-esophageal reflux disease without esophagitis: Secondary | ICD-10-CM | POA: Diagnosis not present

## 2017-08-22 DIAGNOSIS — H259 Unspecified age-related cataract: Secondary | ICD-10-CM | POA: Diagnosis not present

## 2017-08-22 DIAGNOSIS — G5601 Carpal tunnel syndrome, right upper limb: Secondary | ICD-10-CM | POA: Diagnosis present

## 2017-08-22 DIAGNOSIS — M810 Age-related osteoporosis without current pathological fracture: Secondary | ICD-10-CM | POA: Diagnosis not present

## 2017-08-22 DIAGNOSIS — G35 Multiple sclerosis: Secondary | ICD-10-CM | POA: Diagnosis not present

## 2017-08-22 DIAGNOSIS — N814 Uterovaginal prolapse, unspecified: Secondary | ICD-10-CM | POA: Diagnosis not present

## 2017-08-22 DIAGNOSIS — Z471 Aftercare following joint replacement surgery: Secondary | ICD-10-CM | POA: Diagnosis not present

## 2017-08-22 DIAGNOSIS — I340 Nonrheumatic mitral (valve) insufficiency: Secondary | ICD-10-CM | POA: Diagnosis not present

## 2017-08-22 DIAGNOSIS — I420 Dilated cardiomyopathy: Secondary | ICD-10-CM | POA: Diagnosis not present

## 2017-08-22 DIAGNOSIS — I499 Cardiac arrhythmia, unspecified: Secondary | ICD-10-CM | POA: Diagnosis not present

## 2017-08-22 DIAGNOSIS — E559 Vitamin D deficiency, unspecified: Secondary | ICD-10-CM | POA: Diagnosis not present

## 2017-08-22 HISTORY — PX: CARPAL TUNNEL RELEASE: SHX101

## 2017-08-22 HISTORY — PX: REVERSE SHOULDER ARTHROPLASTY: SHX5054

## 2017-08-22 LAB — PROTIME-INR
INR: 1.22
Prothrombin Time: 15.3 seconds — ABNORMAL HIGH (ref 11.4–15.2)

## 2017-08-22 SURGERY — ARTHROPLASTY, SHOULDER, TOTAL, REVERSE
Anesthesia: Regional | Laterality: Right

## 2017-08-22 MED ORDER — ONDANSETRON HCL 4 MG/2ML IJ SOLN: 4.0000 mg | Freq: Once | INTRAMUSCULAR | Status: DC | PRN

## 2017-08-22 MED ORDER — SUGAMMADEX SODIUM 200 MG/2ML IV SOLN
INTRAVENOUS | Status: AC
  Filled 2017-08-22: qty 2

## 2017-08-22 MED ORDER — FENTANYL CITRATE (PF) 250 MCG/5ML IJ SOLN
INTRAMUSCULAR | Status: AC
  Filled 2017-08-22: qty 5

## 2017-08-22 MED ORDER — ONDANSETRON HCL 4 MG/2ML IJ SOLN
INTRAMUSCULAR | Status: DC | PRN
  Administered 2017-08-22: 4 mg via INTRAVENOUS

## 2017-08-22 MED ORDER — MIDAZOLAM HCL 2 MG/2ML IJ SOLN
INTRAMUSCULAR | Status: AC
  Filled 2017-08-22: qty 2

## 2017-08-22 MED ORDER — LINACLOTIDE 145 MCG PO CAPS
290.0000 ug | ORAL_CAPSULE | Freq: Every day | ORAL | Status: DC
  Administered 2017-08-23 – 2017-08-25 (×3): 290 ug via ORAL
  Filled 2017-08-22 (×4): qty 2

## 2017-08-22 MED ORDER — TRAMADOL HCL 50 MG PO TABS
50.0000 mg | ORAL_TABLET | Freq: Four times a day (QID) | ORAL | Status: DC
  Administered 2017-08-22 – 2017-08-25 (×12): 50 mg via ORAL
  Filled 2017-08-22 (×12): qty 1

## 2017-08-22 MED ORDER — POLYETHYLENE GLYCOL 3350 17 G PO PACK: 17.0000 g | PACK | Freq: Every day | ORAL | Status: DC | PRN

## 2017-08-22 MED ORDER — MORPHINE SULFATE (PF) 2 MG/ML IV SOLN: 0.5000 mg | INTRAVENOUS | Status: DC | PRN

## 2017-08-22 MED ORDER — ONDANSETRON HCL 4 MG/2ML IJ SOLN
INTRAMUSCULAR | Status: AC
  Filled 2017-08-22: qty 2

## 2017-08-22 MED ORDER — DEXAMETHASONE SODIUM PHOSPHATE 10 MG/ML IJ SOLN
INTRAMUSCULAR | Status: AC
  Filled 2017-08-22: qty 1

## 2017-08-22 MED ORDER — CEFAZOLIN SODIUM-DEXTROSE 2-4 GM/100ML-% IV SOLN
2.0000 g | Freq: Four times a day (QID) | INTRAVENOUS | Status: AC
  Administered 2017-08-22 – 2017-08-23 (×3): 2 g via INTRAVENOUS
  Filled 2017-08-22 (×3): qty 100

## 2017-08-22 MED ORDER — BUPIVACAINE-EPINEPHRINE 0.5% -1:200000 IJ SOLN
INTRAMUSCULAR | Status: DC | PRN
  Administered 2017-08-22: 10 mL

## 2017-08-22 MED ORDER — FENTANYL CITRATE (PF) 100 MCG/2ML IJ SOLN
INTRAMUSCULAR | Status: DC | PRN
  Administered 2017-08-22 (×2): 50 ug via INTRAVENOUS

## 2017-08-22 MED ORDER — WARFARIN - PHYSICIAN DOSING INPATIENT
Freq: Every day | Status: DC
  Administered 2017-08-22: 17:00:00

## 2017-08-22 MED ORDER — HYDROCODONE-ACETAMINOPHEN 5-325 MG PO TABS: 0.5000 | ORAL_TABLET | Freq: Four times a day (QID) | ORAL | 0 refills | Status: DC | PRN

## 2017-08-22 MED ORDER — ONDANSETRON HCL 4 MG PO TABS: 4.0000 mg | ORAL_TABLET | Freq: Four times a day (QID) | ORAL | Status: DC | PRN

## 2017-08-22 MED ORDER — BUPIVACAINE HCL (PF) 0.25 % IJ SOLN
INTRAMUSCULAR | Status: DC | PRN
  Administered 2017-08-22: 8 mL

## 2017-08-22 MED ORDER — BUPIVACAINE-EPINEPHRINE (PF) 0.5% -1:200000 IJ SOLN
INTRAMUSCULAR | Status: AC
  Filled 2017-08-22: qty 30

## 2017-08-22 MED ORDER — DOCUSATE SODIUM 100 MG PO CAPS
100.0000 mg | ORAL_CAPSULE | Freq: Two times a day (BID) | ORAL | Status: DC
  Administered 2017-08-22 – 2017-08-25 (×6): 100 mg via ORAL
  Filled 2017-08-22 (×5): qty 1

## 2017-08-22 MED ORDER — SUGAMMADEX SODIUM 200 MG/2ML IV SOLN
INTRAVENOUS | Status: DC | PRN
  Administered 2017-08-22: 200 mg via INTRAVENOUS

## 2017-08-22 MED ORDER — LACTATED RINGERS IV SOLN
INTRAVENOUS | Status: DC | PRN
  Administered 2017-08-22: 07:00:00 via INTRAVENOUS

## 2017-08-22 MED ORDER — PROPOFOL 10 MG/ML IV BOLUS
INTRAVENOUS | Status: AC
  Filled 2017-08-22: qty 20

## 2017-08-22 MED ORDER — WARFARIN SODIUM 3 MG PO TABS
3.0000 mg | ORAL_TABLET | Freq: Every day | ORAL | Status: DC
  Administered 2017-08-22 – 2017-08-25 (×4): 3 mg via ORAL
  Filled 2017-08-22 (×4): qty 1

## 2017-08-22 MED ORDER — PHENYLEPHRINE HCL 10 MG/ML IJ SOLN
INTRAVENOUS | Status: DC | PRN
  Administered 2017-08-22: 10 ug/min via INTRAVENOUS

## 2017-08-22 MED ORDER — SODIUM CHLORIDE 0.9 % IV SOLN
INTRAVENOUS | Status: DC
  Administered 2017-08-22: 13:00:00 via INTRAVENOUS

## 2017-08-22 MED ORDER — DEXAMETHASONE SODIUM PHOSPHATE 10 MG/ML IJ SOLN
INTRAMUSCULAR | Status: DC | PRN
  Administered 2017-08-22: 10 mg via INTRAVENOUS

## 2017-08-22 MED ORDER — CEFAZOLIN SODIUM-DEXTROSE 2-4 GM/100ML-% IV SOLN
INTRAVENOUS | Status: AC
  Filled 2017-08-22: qty 100

## 2017-08-22 MED ORDER — MENTHOL 3 MG MT LOZG: 1.0000 | LOZENGE | OROMUCOSAL | Status: DC | PRN

## 2017-08-22 MED ORDER — FENTANYL CITRATE (PF) 100 MCG/2ML IJ SOLN: 25.0000 ug | INTRAMUSCULAR | Status: DC | PRN

## 2017-08-22 MED ORDER — MECLIZINE HCL 25 MG PO TABS
25.0000 mg | ORAL_TABLET | Freq: Three times a day (TID) | ORAL | Status: DC | PRN
  Filled 2017-08-22: qty 1

## 2017-08-22 MED ORDER — HYDROCHLOROTHIAZIDE 25 MG PO TABS
12.5000 mg | ORAL_TABLET | Freq: Every day | ORAL | Status: DC
  Administered 2017-08-23 – 2017-08-25 (×3): 12.5 mg via ORAL
  Filled 2017-08-22 (×3): qty 1

## 2017-08-22 MED ORDER — ACETAMINOPHEN 500 MG PO TABS
500.0000 mg | ORAL_TABLET | Freq: Four times a day (QID) | ORAL | Status: AC
  Administered 2017-08-22 – 2017-08-23 (×3): 500 mg via ORAL
  Filled 2017-08-22 (×3): qty 1

## 2017-08-22 MED ORDER — METOCLOPRAMIDE HCL 5 MG/ML IJ SOLN: 5.0000 mg | Freq: Three times a day (TID) | INTRAMUSCULAR | Status: DC | PRN

## 2017-08-22 MED ORDER — LIDOCAINE HCL (CARDIAC) 20 MG/ML IV SOLN
INTRAVENOUS | Status: DC | PRN
  Administered 2017-08-22: 100 mg via INTRAVENOUS

## 2017-08-22 MED ORDER — ROCURONIUM BROMIDE 100 MG/10ML IV SOLN
INTRAVENOUS | Status: DC | PRN
  Administered 2017-08-22: 30 mg via INTRAVENOUS

## 2017-08-22 MED ORDER — METOCLOPRAMIDE HCL 5 MG PO TABS: 5.0000 mg | ORAL_TABLET | Freq: Three times a day (TID) | ORAL | Status: DC | PRN

## 2017-08-22 MED ORDER — CEFAZOLIN SODIUM-DEXTROSE 2-4 GM/100ML-% IV SOLN
2.0000 g | INTRAVENOUS | Status: AC
  Administered 2017-08-22: 2 g via INTRAVENOUS

## 2017-08-22 MED ORDER — HYDROCODONE-ACETAMINOPHEN 5-325 MG PO TABS
1.0000 | ORAL_TABLET | ORAL | Status: DC | PRN
  Administered 2017-08-23 – 2017-08-25 (×4): 1 via ORAL
  Filled 2017-08-22 (×4): qty 1

## 2017-08-22 MED ORDER — BUPIVACAINE HCL (PF) 0.25 % IJ SOLN
INTRAMUSCULAR | Status: AC
  Filled 2017-08-22: qty 10

## 2017-08-22 MED ORDER — ACETAMINOPHEN 325 MG PO TABS
325.0000 mg | ORAL_TABLET | Freq: Four times a day (QID) | ORAL | Status: DC | PRN
  Administered 2017-08-25: 650 mg via ORAL
  Filled 2017-08-22: qty 2

## 2017-08-22 MED ORDER — CHLORHEXIDINE GLUCONATE 4 % EX LIQD: 60.0000 mL | Freq: Once | CUTANEOUS | Status: DC

## 2017-08-22 MED ORDER — ONDANSETRON HCL 4 MG/2ML IJ SOLN
4.0000 mg | Freq: Four times a day (QID) | INTRAMUSCULAR | Status: DC | PRN
  Administered 2017-08-22: 4 mg via INTRAVENOUS
  Filled 2017-08-22: qty 2

## 2017-08-22 MED ORDER — PROPOFOL 10 MG/ML IV BOLUS
INTRAVENOUS | Status: DC | PRN
  Administered 2017-08-22: 100 mg via INTRAVENOUS

## 2017-08-22 MED ORDER — PHENOL 1.4 % MT LIQD: 1.0000 | OROMUCOSAL | Status: DC | PRN

## 2017-08-22 MED ORDER — ACETAMINOPHEN 10 MG/ML IV SOLN: 1000.0000 mg | Freq: Once | INTRAVENOUS | Status: DC | PRN

## 2017-08-22 MED ORDER — 0.9 % SODIUM CHLORIDE (POUR BTL) OPTIME
TOPICAL | Status: DC | PRN
  Administered 2017-08-22: 1000 mL

## 2017-08-22 MED ORDER — TRAMADOL HCL 50 MG PO TABS: 50.0000 mg | ORAL_TABLET | Freq: Four times a day (QID) | ORAL | Status: DC

## 2017-08-22 MED ORDER — BISACODYL 10 MG RE SUPP: 10.0000 mg | Freq: Every day | RECTAL | Status: DC | PRN

## 2017-08-22 SURGICAL SUPPLY — 83 items
AID PSTN UNV HD RSTRNT DISP (MISCELLANEOUS) ×1
BANDAGE ACE 3X5.8 VEL STRL LF (GAUZE/BANDAGES/DRESSINGS) ×4 IMPLANT
BANDAGE ACE 4X5 VEL STRL LF (GAUZE/BANDAGES/DRESSINGS) ×2 IMPLANT
BANDAGE ELASTIC 6 VELCRO ST LF (GAUZE/BANDAGES/DRESSINGS) ×1 IMPLANT
BIT DRILL 170X2.5X (BIT) IMPLANT
BIT DRILL 5/64X5 DISP (BIT) IMPLANT
BIT DRL 170X2.5X (BIT)
BLADE SAG 18X100X1.27 (BLADE) ×2 IMPLANT
BNDG CMPR 9X4 STRL LF SNTH (GAUZE/BANDAGES/DRESSINGS) ×1
BNDG ESMARK 4X9 LF (GAUZE/BANDAGES/DRESSINGS) ×2 IMPLANT
BNDG GAUZE ELAST 4 BULKY (GAUZE/BANDAGES/DRESSINGS) ×2 IMPLANT
CAPT SHLDR REVTOTAL 1 ×1 IMPLANT
COVER SURGICAL LIGHT HANDLE (MISCELLANEOUS) ×2 IMPLANT
CUFF TOURNIQUET SINGLE 18IN (TOURNIQUET CUFF) ×2 IMPLANT
CUFF TOURNIQUET SINGLE 24IN (TOURNIQUET CUFF) IMPLANT
DRAPE INCISE IOBAN 66X45 STRL (DRAPES) ×3 IMPLANT
DRAPE ORTHO SPLIT 77X108 STRL (DRAPES) ×4
DRAPE SURG 17X23 STRL (DRAPES) ×2 IMPLANT
DRAPE SURG ORHT 6 SPLT 77X108 (DRAPES) ×2 IMPLANT
DRAPE U-SHAPE 47X51 STRL (DRAPES) ×3 IMPLANT
DRILL 2.5 (BIT)
DRSG ADAPTIC 3X8 NADH LF (GAUZE/BANDAGES/DRESSINGS) ×2 IMPLANT
DRSG PAD ABDOMINAL 8X10 ST (GAUZE/BANDAGES/DRESSINGS) ×2 IMPLANT
DURAPREP 26ML APPLICATOR (WOUND CARE) ×3 IMPLANT
ELECT BLADE 4.0 EZ CLEAN MEGAD (MISCELLANEOUS) ×4
ELECT NDL TIP 2.8 STRL (NEEDLE) ×1 IMPLANT
ELECT NEEDLE TIP 2.8 STRL (NEEDLE) ×4 IMPLANT
ELECT REM PT RETURN 9FT ADLT (ELECTROSURGICAL) ×2
ELECTRODE BLDE 4.0 EZ CLN MEGD (MISCELLANEOUS) ×1 IMPLANT
ELECTRODE REM PT RTRN 9FT ADLT (ELECTROSURGICAL) ×1 IMPLANT
GAUZE SPONGE 4X4 12PLY STRL (GAUZE/BANDAGES/DRESSINGS) ×3 IMPLANT
GAUZE XEROFORM 1X8 LF (GAUZE/BANDAGES/DRESSINGS) ×2 IMPLANT
GLOVE BIOGEL PI ORTHO PRO 7.5 (GLOVE) ×2
GLOVE BIOGEL PI ORTHO PRO SZ8 (GLOVE) ×2
GLOVE ORTHO TXT STRL SZ7.5 (GLOVE) ×3 IMPLANT
GLOVE PI ORTHO PRO STRL 7.5 (GLOVE) ×1 IMPLANT
GLOVE PI ORTHO PRO STRL SZ8 (GLOVE) ×1 IMPLANT
GLOVE SURG ORTHO 8.5 STRL (GLOVE) ×3 IMPLANT
GOWN STRL REUS W/ TWL LRG LVL3 (GOWN DISPOSABLE) ×2 IMPLANT
GOWN STRL REUS W/ TWL XL LVL3 (GOWN DISPOSABLE) ×2 IMPLANT
GOWN STRL REUS W/TWL LRG LVL3 (GOWN DISPOSABLE) ×6
GOWN STRL REUS W/TWL XL LVL3 (GOWN DISPOSABLE) ×6
KIT BASIN OR (CUSTOM PROCEDURE TRAY) ×2 IMPLANT
KIT ROOM TURNOVER OR (KITS) ×2 IMPLANT
MANIFOLD NEPTUNE II (INSTRUMENTS) ×2 IMPLANT
NDL 1/2 CIR MAYO (NEEDLE) ×1 IMPLANT
NDL HYPO 25GX1X1/2 BEV (NEEDLE) ×1 IMPLANT
NEEDLE 1/2 CIR MAYO (NEEDLE) ×2 IMPLANT
NEEDLE HYPO 25GX1X1/2 BEV (NEEDLE) ×4 IMPLANT
NS IRRIG 1000ML POUR BTL (IV SOLUTION) ×2 IMPLANT
PACK ORTHO EXTREMITY (CUSTOM PROCEDURE TRAY) ×3 IMPLANT
PACK SHOULDER (CUSTOM PROCEDURE TRAY) ×2 IMPLANT
PAD ABD 8X10 STRL (GAUZE/BANDAGES/DRESSINGS) ×2 IMPLANT
PAD ARMBOARD 7.5X6 YLW CONV (MISCELLANEOUS) ×4 IMPLANT
PAD CAST 4YDX4 CTTN HI CHSV (CAST SUPPLIES) ×2 IMPLANT
PADDING CAST COTTON 4X4 STRL (CAST SUPPLIES) ×4
PIN GUIDE 1.2 (PIN) IMPLANT
PIN GUIDE GLENOPHERE 1.5MX300M (PIN) IMPLANT
PIN METAGLENE 2.5 (PIN) IMPLANT
RESTRAINT HEAD UNIVERSAL NS (MISCELLANEOUS) ×2 IMPLANT
SLING ARM IMMOBILIZER LRG (SOFTGOODS) ×1 IMPLANT
SPONGE LAP 18X18 X RAY DECT (DISPOSABLE) IMPLANT
SPONGE LAP 4X18 X RAY DECT (DISPOSABLE) ×2 IMPLANT
STRIP CLOSURE SKIN 1/2X4 (GAUZE/BANDAGES/DRESSINGS) ×2 IMPLANT
SUCTION FRAZIER HANDLE 10FR (MISCELLANEOUS) ×1
SUCTION TUBE FRAZIER 10FR DISP (MISCELLANEOUS) ×1 IMPLANT
SUT ETHILON 3 0 PS 1 (SUTURE) ×2 IMPLANT
SUT FIBERWIRE #2 38 T-5 BLUE (SUTURE)
SUT MNCRL AB 4-0 PS2 18 (SUTURE) ×2 IMPLANT
SUT VIC AB 0 CT2 27 (SUTURE) ×2 IMPLANT
SUT VIC AB 2-0 CT1 27 (SUTURE) ×2
SUT VIC AB 2-0 CT1 TAPERPNT 27 (SUTURE) ×1 IMPLANT
SUT VICRYL 0 CT 1 36IN (SUTURE) ×2 IMPLANT
SUTURE FIBERWR #2 38 T-5 BLUE (SUTURE) IMPLANT
SYR CONTROL 10ML LL (SYRINGE) ×3 IMPLANT
TAPE CLOTH SURG 6X10 WHT LF (GAUZE/BANDAGES/DRESSINGS) ×1 IMPLANT
TOWEL OR 17X24 6PK STRL BLUE (TOWEL DISPOSABLE) ×2 IMPLANT
TOWEL OR 17X26 10 PK STRL BLUE (TOWEL DISPOSABLE) ×2 IMPLANT
TOWER CARTRIDGE SMART MIX (DISPOSABLE) IMPLANT
TUBE CONNECTING 12X1/4 (SUCTIONS) IMPLANT
UNDERPAD 30X30 (UNDERPADS AND DIAPERS) ×2 IMPLANT
WATER STERILE IRR 1000ML POUR (IV SOLUTION) ×2 IMPLANT
YANKAUER SUCT BULB TIP NO VENT (SUCTIONS) ×2 IMPLANT

## 2017-08-22 NOTE — Evaluation (Addendum)
Physical Therapy Evaluation Patient Details Name: Virginia Young MRN: [id number] DOB: June 09, 1941 Today's Date: 08/22/2017   History of Present Illness  77 y.o. female admitted on 08/22/17 for elective R reverse TSA and R carpal tunnel release.  Pt with significant PMH of non ischemic cardiomyopathy, MS, urinary incontinence, chronic systolic heart failure, and A-fib.  Clinical Impression  Pt was able to get OOB to the chair this evening.  She was explaining that she lives by herself and family cannot be at home with her to assist after d/c.  She is right handed and uses her cane in her right hand for gait.  She has a h/o falls and is at high risk for falling.  SNF level rehab at discharge would be safest.  PT to follow acutely for deficits listed below.       Follow Up Recommendations SNF    Equipment Recommendations  None recommended by PT    Recommendations for Other Services   NA    Precautions / Restrictions Precautions Precautions: Fall Precaution Comments: pt with h/o falls Required Braces or Orthoses: Sling(R arm) Restrictions Weight Bearing Restrictions: Yes RUE Weight Bearing: Non weight bearing      Mobility  Bed Mobility Overal bed mobility: Needs Assistance Bed Mobility: Supine to Sit     Supine to sit: Min assist;HOB elevated     General bed mobility comments: Min assist to get out of the left side of the bed (so that she would not WB through her right arm) with HOB maximally elevated and use of rail for leverage with left hand.  Pt needed assist at trunk to power up and to help her scoot her hips to EOB.   Transfers Overall transfer level: Needs assistance Equipment used: 1 person hand held assist Transfers: Sit to/from [facility] Sit to Stand: Mod assist;From elevated surface Stand pivot transfers: Mod assist       General transfer comment: Mod assist to support trunk to power up.  Pt's trunk is flexed over in standing. Mod assist to  support trunk for balance and help control descent to sit down.           Balance Overall balance assessment: Needs assistance Sitting-balance support: Feet supported;Single extremity supported Sitting balance-Leahy Scale: Fair     Standing balance support: Single extremity supported Standing balance-Leahy Scale: Poor                               Pertinent Vitals/Pain Pain Assessment: No/denies pain(cannot feel arm yet)    Home Living Family/patient expects to be discharged to:: Private residence Living Arrangements: Alone Available Help at Discharge: Family;Available PRN/intermittently Type of Home: House Home Access: Stairs to enter Entrance Stairs-Rails: Psychiatric nurse of Steps: 2 Home Layout: One level Home Equipment: Cane - single point      Prior Function Level of Independence: Independent with assistive device(s)         Comments: Pt uses SPC in her right hand normally for gait in the house and in the community.  She still drives, does light houswork, has a housekeeper that comes every 2 weeks and her son does her yardwork.       Hand Dominance   Dominant Hand: Right    Extremity/Trunk Assessment   Upper Extremity Assessment Upper Extremity Assessment: Defer to OT evaluation    Lower Extremity Assessment Lower Extremity Assessment: Generalized weakness    Cervical / Trunk  Assessment Cervical / Trunk Assessment: Kyphotic(flexed trunk posture per daughter this is baseline)  Communication   Communication: No difficulties  Cognition Arousal/Alertness: Awake/alert Behavior During Therapy: WFL for tasks assessed/performed Overall Cognitive Status: Within Functional Limits for tasks assessed                                        General Comments General comments (skin integrity, edema, etc.): Ice pack applied to shoulder with elbow propped on pillows when up in recliner chair.          Assessment/Plan    PT Assessment Patient needs continued PT services  PT Problem List Decreased strength;Decreased balance;Decreased mobility;Decreased range of motion;Decreased activity tolerance;Decreased knowledge of use of DME;Decreased knowledge of precautions;Pain       PT Treatment Interventions DME instruction;Gait training;Stair training;Functional mobility training;Therapeutic activities;Therapeutic exercise;Balance training;Patient/family education;Modalities    PT Goals (Current goals can be found in the Care Plan section)  Acute Rehab PT Goals Patient Stated Goal: to go home after a short rehab stay PT Goal Formulation: With patient Time For Goal Achievement: 08/29/17 Potential to Achieve Goals: Good    Frequency Min 5X/week   Barriers to discharge Decreased caregiver support she does not have anyone who can stay with her full time (even initially) after surgery.         AM-PAC PT "6 Clicks" Daily Activity  Outcome Measure Difficulty turning over in bed (including adjusting bedclothes, sheets and blankets)?: Unable Difficulty moving from lying on back to sitting on the side of the bed? : Unable Difficulty sitting down on and standing up from a chair with arms (e.g., wheelchair, bedside commode, etc,.)?: Unable Help needed moving to and from a bed to chair (including a wheelchair)?: A Lot Help needed walking in hospital room?: A Lot Help needed climbing 3-5 steps with a railing? : A Lot 6 Click Score: 9    End of Session   Activity Tolerance: Patient tolerated treatment well Patient left: in chair;with call bell/phone within reach;with family/visitor present   PT Visit Diagnosis: History of falling (Z91.81);Difficulty in walking, not elsewhere classified (R26.2);Pain Pain - Right/Left: Right Pain - part of body: Shoulder    Time: 1527-1550 PT Time Calculation (min) (ACUTE ONLY): 23 min   Charges:           Wells Guiles B. Makendra Vigeant, PT, DPT  [phone]   PT Evaluation $PT Eval Moderate Complexity: 1 Mod PT Treatments $Therapeutic Activity: 8-22 mins   08/22/2017, 4:04 PM

## 2017-08-22 NOTE — Plan of Care (Signed)
  Progressing Safety: Ability to remain free from injury will improve 08/22/2017 1523 - Progressing by Rance Muir, RN

## 2017-08-22 NOTE — Anesthesia Procedure Notes (Signed)
Procedure Name: Intubation Date/Time: 08/22/2017 7:37 AM Performed by: Kyung Rudd, CRNA Pre-anesthesia Checklist: Patient identified, Emergency Drugs available, Suction available and Patient being monitored Patient Re-evaluated:Patient Re-evaluated prior to induction Oxygen Delivery Method: Circle system utilized Preoxygenation: Pre-oxygenation with 100% oxygen Induction Type: IV induction Ventilation: Mask ventilation without difficulty Laryngoscope Size: Mac and 3 Grade View: Grade I Tube type: Oral Tube size: 7.0 mm Number of attempts: 1 Airway Equipment and Method: Stylet Placement Confirmation: ETT inserted through vocal cords under direct vision,  positive ETCO2 and breath sounds checked- equal and bilateral Secured at: 21 cm Tube secured with: Tape Dental Injury: Teeth and Oropharynx as per pre-operative assessment

## 2017-08-22 NOTE — Interval H&P Note (Signed)
History and Physical Interval Note:  08/22/2017 7:26 AM  Virginia Young  has presented today for surgery, with the diagnosis of Right shoulder rotator cuff tear/osteoarthritis; right hand carpal tunnel syndrome  The various methods of treatment have been discussed with the patient and family. After consideration of risks, benefits and other options for treatment, the patient has consented to  Procedure(s): REVERSE SHOULDER ARTHROPLASTY (Right) CARPAL TUNNEL RELEASE (Right) as a surgical intervention .  The patient's history has been reviewed, patient examined, no change in status, stable for surgery.  I have reviewed the patient's chart and labs.  Questions were answered to the patient's satisfaction.     [name],STEVEN R

## 2017-08-22 NOTE — Anesthesia Postprocedure Evaluation (Signed)
Anesthesia Post Note  Patient: Virginia Young  Procedure(s) Performed: REVERSE SHOULDER ARTHROPLASTY (Right ) CARPAL TUNNEL RELEASE (Right )     Patient location during evaluation: PACU Anesthesia Type: Regional and General Level of consciousness: awake and alert Pain management: pain level controlled Vital Signs Assessment: post-procedure vital signs reviewed and stable Respiratory status: spontaneous breathing, nonlabored ventilation, respiratory function stable and patient connected to nasal cannula oxygen Cardiovascular status: blood pressure returned to baseline and stable Postop Assessment: no apparent nausea or vomiting Anesthetic complications: no    Last Vitals:  Vitals:   08/22/17 1145 08/22/17 1242  BP: (!) 168/88 (!) 160/97  Pulse: 95 94  Resp: 19 19  Temp:  (!) 36.3 C  SpO2: 97% 95%    Last Pain:  Vitals:   08/22/17 1242  TempSrc: Oral  PainSc:                  Barnet Glasgow

## 2017-08-22 NOTE — Brief Op Note (Signed)
08/22/2017  9:52 AM  PATIENT:  Virginia Young  77 y.o. female  PRE-OPERATIVE DIAGNOSIS:  Right shoulder rotator cuff tear/osteoarthritis; right hand carpal tunnel syndrome  POST-OPERATIVE DIAGNOSIS:  Right Shoulder Rotator Cuff tear/osteoarthritis, right hand Carpal tunnel syndrome     PROCEDURE:  Procedure(s): REVERSE SHOULDER ARTHROPLASTY (Right) CARPAL TUNNEL RELEASE (Right)   DePuy Delta xtend  SURGEON:  Surgeon(s) and Role:    [name], MD - Primary  PHYSICIAN ASSISTANT:   ASSISTANTS: Ventura Bruns, PA-C   ANESTHESIA:   regional and general  EBL:  150 mL   BLOOD ADMINISTERED:none  DRAINS: none   LOCAL MEDICATIONS USED:  MARCAINE     SPECIMEN:  No Specimen  DISPOSITION OF SPECIMEN:  N/A  COUNTS:  YES  TOURNIQUET:   Total Tourniquet Time Documented: area (laterality) - 9 minutes Total: area (laterality) - 9 minutes   DICTATION: .Other Dictation: Dictation Number [id number]  PLAN OF CARE: Admit to inpatient   PATIENT DISPOSITION:  PACU - hemodynamically stable.   Delay start of Pharmacological VTE agent (>24hrs) due to surgical blood loss or risk of bleeding: not applicable

## 2017-08-22 NOTE — Transfer of Care (Signed)
Immediate Anesthesia Transfer of Care Note  Patient: Virginia Young  Procedure(s) Performed: REVERSE SHOULDER ARTHROPLASTY (Right ) CARPAL TUNNEL RELEASE (Right )  Patient Location: PACU  Anesthesia Type:GA combined with regional for post-op pain  Level of Consciousness: awake, alert  and oriented  Airway & Oxygen Therapy: Patient Spontanous Breathing and Patient connected to nasal cannula oxygen  Post-op Assessment: Report given to RN, Post -op Vital signs reviewed and stable and Patient moving all extremities  Post vital signs: Reviewed and stable  Last Vitals:  Vitals:   08/22/17 0617 08/22/17 0705  BP: 136/78 (!) 154/87  Pulse: 84 82  Resp: 18 15  Temp: 36.6 C   SpO2: 97% 98%    Last Pain:  Vitals:   08/22/17 0700  TempSrc:   PainSc: 6       Patients Stated Pain Goal: 3 (41/03/01 3143)  Complications: No apparent anesthesia complications

## 2017-08-22 NOTE — Anesthesia Procedure Notes (Addendum)
Anesthesia Regional Block: Interscalene brachial plexus block   Pre-Anesthetic Checklist: ,, timeout performed, Correct Patient, Correct Site, Correct Laterality, Correct Procedure, Correct Position, site marked, Risks and benefits discussed, at surgeon's request and post-op pain management  Laterality: Upper and Right  Prep: Betadine, chloraprep, alcohol swabs       Needles:  Injection technique: Single-shot  Needle Type: Echogenic Needle     Needle Length: 5cm  Needle Gauge: 22     Additional Needles:   Procedures:,,,, ultrasound used (permanent image in chart),,,,  Narrative:  Start time: 08/22/2017 6:59 AM End time: 08/22/2017 7:09 AM  Performed by: Personally   Additional Notes: Block assessed prior to start of surgery

## 2017-08-22 NOTE — Addendum Note (Signed)
Addendum  created 08/22/17 1541 by Suzette Battiest, MD   Intraprocedure Staff edited

## 2017-08-23 LAB — HEMOGLOBIN AND HEMATOCRIT, BLOOD
HCT: 34.6 % — ABNORMAL LOW (ref 36.0–46.0)
Hemoglobin: 11.4 g/dL — ABNORMAL LOW (ref 12.0–15.0)

## 2017-08-23 LAB — BASIC METABOLIC PANEL
Anion gap: 12 (ref 5–15)
BUN: 10 mg/dL (ref 6–20)
CO2: 21 mmol/L — ABNORMAL LOW (ref 22–32)
Calcium: 7.6 mg/dL — ABNORMAL LOW (ref 8.9–10.3)
Chloride: 97 mmol/L — ABNORMAL LOW (ref 101–111)
Creatinine, Ser: 1.08 mg/dL — ABNORMAL HIGH (ref 0.44–1.00)
GFR calc Af Amer: 56 mL/min — ABNORMAL LOW (ref >60)
GFR calc non Af Amer: 48 mL/min — ABNORMAL LOW (ref >60)
Glucose, Bld: 243 mg/dL — ABNORMAL HIGH (ref 65–99)
Potassium: 3.7 mmol/L (ref 3.5–5.1)
Sodium: 130 mmol/L — ABNORMAL LOW (ref 135–145)

## 2017-08-23 LAB — PROTIME-INR
INR: 1.33
Prothrombin Time: 16.4 seconds — ABNORMAL HIGH (ref 11.4–15.2)

## 2017-08-23 NOTE — Evaluation (Signed)
Occupational Therapy Evaluation Patient Details Name: Virginia Young MRN: [id number] DOB: [birth year] Today's Date: 08/23/2017    History of Present Illness 77 y.o. female admitted on 08/22/17 for elective R reverse TSA and R carpal tunnel release.  Pt with significant PMH of non ischemic cardiomyopathy, MS, urinary incontinence, chronic systolic heart failure, and A-fib.   Clinical Impression   PTA, pt was living alone and was independent with ADLs and light IADLs. Pt currently required Max A for ADLs and Mod A for functional mobility. Providing education and handout on shoulder precautions, exercises, UB ADLs, LB ADLs, and sling management. Pt will required further acute OT for facilitating safe dc. Recommend dc to SNF for further OT to optimize safety and independence with ADLs and functional mobility.     Follow Up Recommendations  SNF;Supervision/Assistance - 24 hour    Equipment Recommendations  Other (comment)(Defer to next venue)    Recommendations for Other Services PT consult     Precautions / Restrictions Precautions Precautions: Shoulder;Fall Type of Shoulder Precautions: Conservative protocal. No shoulder ROM. WFL for elbow and hand ROM. No wrist ROM due to carpal tunnel surgery and wirst immobilized.  Shoulder Interventions: Shoulder sling/immobilizer;At all times;Off for dressing/bathing/exercises Precaution Comments: pt with h/o falls Required Braces or Orthoses: Sling(R arm) Restrictions Weight Bearing Restrictions: Yes RUE Weight Bearing: Non weight bearing      Mobility Bed Mobility Overal bed mobility: Needs Assistance Bed Mobility: Sit to Supine     Supine to sit: Min guard;Min assist     General bed mobility comments: Min Guard for safert and Min A for pillow placement for support of shoulder  Transfers Overall transfer level: Needs assistance Equipment used: 1 person hand held assist;Straight cane Transfers: Sit to/from Stand Sit to Stand: Mod  assist;From elevated surface         General transfer comment: Mod A for power up into standing and balance.     Balance Overall balance assessment: Needs assistance Sitting-balance support: Feet supported;Single extremity supported Sitting balance-Leahy Scale: Fair     Standing balance support: Single extremity supported Standing balance-Leahy Scale: Poor                             ADL either performed or assessed with clinical judgement   ADL Overall ADL's : Needs assistance/impaired Eating/Feeding: Set up;Sitting   Grooming: Set up;Supervision/safety;Sitting   Upper Body Bathing: Maximal assistance;Sitting Upper Body Bathing Details (indicate cue type and reason): Providing education on UB bathing in sitting. Lower Body Bathing: Maximal assistance;Sit to/from stand   Upper Body Dressing : Maximal assistance;Sitting Upper Body Dressing Details (indicate cue type and reason): Educated pt on compensatory techniques for donning/doffing shirts. Pt demonstrating poor recall and understanding. Lower Body Dressing: Maximal assistance;Sit to/from stand   Toilet Transfer: Moderate assistance;Ambulation(Simulated in room)           Functional mobility during ADLs: Moderate assistance General ADL Comments: Pt requiring Max A for ADLs and Mod A for funcitonal mobility.      Vision         Perception     Praxis      Pertinent Vitals/Pain Pain Assessment: 0-10 Pain Score: 6  Pain Location: Right shoulder and hand Pain Descriptors / Indicators: Grimacing;Guarding;Moaning;Sore Pain Intervention(s): Monitored during session;Limited activity within patient's tolerance;Repositioned     Hand Dominance Right   Extremity/Trunk Assessment Upper Extremity Assessment Upper Extremity Assessment: RUE deficits/detail RUE Deficits / Details: shoulder replacement  and carpal tunnel surgery RUE: Unable to fully assess due to immobilization RUE Sensation: decreased  light touch RUE Coordination: decreased fine motor;decreased gross motor   Lower Extremity Assessment Lower Extremity Assessment: Generalized weakness   Cervical / Trunk Assessment Cervical / Trunk Assessment: Kyphotic(flexed trunk posture per daughter this is baseline)   Communication Communication Communication: No difficulties   Cognition Arousal/Alertness: Awake/alert Behavior During Therapy: WFL for tasks assessed/performed;Anxious;Impulsive Overall Cognitive Status: Within Functional Limits for tasks assessed                                 General Comments: anxious and impulsive when feeling the need to sit down   General Comments       Exercises Exercises: Shoulder Shoulder Exercises Elbow Flexion: AAROM;Right;10 reps;Seated Elbow Extension: AAROM;10 reps;Right;Seated Wrist Flexion: AAROM;Right;10 reps;Seated Wrist Extension: AAROM;Right;10 reps;Seated Digit Composite Flexion: AAROM;Right;10 reps;Seated Composite Extension: AAROM;Right;10 reps;Seated Neck Flexion: AROM;5 reps;Seated Neck Extension: AROM;5 reps;Seated Neck Lateral Flexion - Right: AROM;5 reps;Seated Neck Lateral Flexion - Left: AROM;5 reps;Seated   Shoulder Instructions Shoulder Instructions Donning/doffing shirt without moving shoulder: Maximal assistance Method for sponge bathing under operated UE: Maximal assistance Donning/doffing sling/immobilizer: Maximal assistance Correct positioning of sling/immobilizer: Maximal assistance ROM for elbow, wrist and digits of operated UE: Minimal assistance Sling wearing schedule (on at all times/off for ADL's): Maximal assistance Proper positioning of operated UE when showering: Minimal assistance Positioning of UE while sleeping: Minimal assistance    Home Living Family/patient expects to be discharged to:: Private residence Living Arrangements: Alone Available Help at Discharge: Family;Available PRN/intermittently Type of Home:  House Home Access: Stairs to enter [employer] of Steps: 2 Entrance Stairs-Rails: Right;Left Home Layout: One level     Bathroom Shower/Tub: Occupational psychologist: Standard     Home Equipment: Cane - single point          Prior Functioning/Environment Level of Independence: Independent with assistive device(s)        Comments: Pt uses SPC in her right hand normally for gait in the house and in the community.  She still drives, does light houswork, has a housekeeper that comes every 2 weeks and her son does her yardwork.          OT Problem List: Decreased strength;Decreased range of motion;Decreased activity tolerance;Impaired balance (sitting and/or standing);Decreased safety awareness;Decreased knowledge of use of DME or AE;Decreased knowledge of precautions;Pain;Impaired UE functional use      OT Treatment/Interventions: Self-care/ADL training;Therapeutic exercise;Energy conservation;DME and/or AE instruction;Therapeutic activities;Patient/family education    OT Goals(Current goals can be found in the care plan section) Acute Rehab OT Goals Patient Stated Goal: to go home after a short rehab stay OT Goal Formulation: With patient Time For Goal Achievement: 09/06/17 Potential to Achieve Goals: Good ADL Goals Pt Will Perform Lower Body Bathing: with caregiver independent in assisting;sit to/from stand;with min assist Pt Will Perform Lower Body Dressing: with min assist;sit to/from stand;with caregiver independent in assisting Pt Will Transfer to Toilet: with min guard assist;ambulating;bedside commode Pt/caregiver will Perform Home Exercise Program: Increased ROM;Increased strength;Right Upper extremity;With Supervision;With written HEP provided  OT Frequency: Min 2X/week   Barriers to D/C:            Co-evaluation              AM-PAC PT "6 Clicks" Daily Activity     Outcome Measure Help from another person eating meals?: None Help  from  another person taking care of personal grooming?: A Little Help from another person toileting, which includes using toliet, bedpan, or urinal?: A Lot Help from another person bathing (including washing, rinsing, drying)?: A Lot Help from another person to put on and taking off regular upper body clothing?: A Lot Help from another person to put on and taking off regular lower body clothing?: A Lot 6 Click Score: 15   End of Session Equipment Utilized During Treatment: Other (comment)(Sling) Nurse Communication: Mobility status;Precautions;Weight bearing status  Activity Tolerance: Patient limited by pain Patient left: in bed;with call bell/phone within reach  OT Visit Diagnosis: Unsteadiness on feet (R26.81);Other abnormalities of gait and mobility (R26.89);Muscle weakness (generalized) (M62.81);Pain Pain - Right/Left: Right Pain - part of body: Shoulder                Time: 1526-1550 OT Time Calculation (min): 24 min Charges:  OT General Charges $OT Visit: 1 Visit OT Evaluation $OT Eval Moderate Complexity: 1 Mod OT Treatments $Self Care/Home Management : 8-22 mins G-Codes:     [place], OTR/L Acute Rehab Pager: [phone] Office: [place] 08/23/2017, 4:38 PM

## 2017-08-23 NOTE — Discharge Instructions (Signed)
Ice to the shoulder as much as you can tolerate. Keep a pillow propped behind the elbow and under the right hand to elevate the hand  Wiggle fingers to promote circulation.  Keep the splint clean and dry until follow up - sponge bathe only.  Keep right shoulder incision covered and clean and dry for one week, then ok to shower as long as you bag and cover the splint/hand.  Follow up in two weeks in the office [phone]   Information on my medicine - Coumadin   (Warfarin)  Why was Coumadin prescribed for you? Coumadin was prescribed for you because you have a blood clot or a medical condition that can cause an increased risk of forming blood clots. Blood clots can cause serious health problems by blocking the flow of blood to the heart, lung, or brain. Coumadin can prevent harmful blood clots from forming. As a reminder your indication for Coumadin is:   Stroke Prevention Because Of Atrial Fibrillation  What test will check on my response to Coumadin? While on Coumadin (warfarin) you will need to have an INR test regularly to ensure that your dose is keeping you in the desired range. The INR (international normalized ratio) number is calculated from the result of the laboratory test called prothrombin time (PT).  If an INR APPOINTMENT HAS NOT ALREADY BEEN MADE FOR YOU please schedule an appointment to have this lab work done by your health care provider within 7 days. Your INR goal is usually a number between:  2 to 3 or your provider may give you a more narrow range like 2-2.5.  Ask your health care provider during an office visit what your goal INR is.  What  do you need to  know  About  COUMADIN? Take Coumadin (warfarin) exactly as prescribed by your healthcare provider about the same time each day.  DO NOT stop taking without talking to the doctor who prescribed the medication.  Stopping without other blood clot prevention medication to take the place of Coumadin may increase your risk  of developing a new clot or stroke.  Get refills before you run out.  What do you do if you miss a dose? If you miss a dose, take it as soon as you remember on the same day then continue your regularly scheduled regimen the next day.  Do not take two doses of Coumadin at the same time.  Important Safety Information A possible side effect of Coumadin (Warfarin) is an increased risk of bleeding. You should call your healthcare provider right away if you experience any of the following: ? Bleeding from an injury or your nose that does not stop. ? Unusual colored urine (red or dark brown) or unusual colored stools (red or black). ? Unusual bruising for unknown reasons. ? A serious fall or if you hit your head (even if there is no bleeding).  Some foods or medicines interact with Coumadin (warfarin) and might alter your response to warfarin. To help avoid this: ? Eat a balanced diet, maintaining a consistent amount of Vitamin K. ? Notify your provider about major diet changes you plan to make. ? Avoid alcohol or limit your intake to 1 drink for women and 2 drinks for men per day. (1 drink is 5 oz. wine, 12 oz. beer, or 1.5 oz. liquor.)  Make sure that ANY health care provider who prescribes medication for you knows that you are taking Coumadin (warfarin).  Also make sure the healthcare provider who  is monitoring your Coumadin knows when you have started a new medication including herbals and non-prescription products.  Coumadin (Warfarin)  Major Drug Interactions  Increased Warfarin Effect Decreased Warfarin Effect  Alcohol (large quantities) Antibiotics (esp. Septra/Bactrim, Flagyl, Cipro) Amiodarone (Cordarone) Aspirin (ASA) Cimetidine (Tagamet) Megestrol (Megace) NSAIDs (ibuprofen, naproxen, etc.) Piroxicam (Feldene) Propafenone (Rythmol SR) Propranolol (Inderal) Isoniazid (INH) Posaconazole (Noxafil) Barbiturates (Phenobarbital) Carbamazepine (Tegretol) Chlordiazepoxide  (Librium) Cholestyramine (Questran) Griseofulvin Oral Contraceptives Rifampin Sucralfate (Carafate) Vitamin K   Coumadin (Warfarin) Major Herbal Interactions  Increased Warfarin Effect Decreased Warfarin Effect  Garlic Ginseng Ginkgo biloba Coenzyme Q10 Green tea St. Johns wort    Coumadin (Warfarin) FOOD Interactions  Eat a consistent number of servings per week of foods HIGH in Vitamin K (1 serving =  cup)  Collards (cooked, or boiled & drained) Kale (cooked, or boiled & drained) Mustard greens (cooked, or boiled & drained) Parsley *serving size only =  cup Spinach (cooked, or boiled & drained) Swiss chard (cooked, or boiled & drained) Turnip greens (cooked, or boiled & drained)  Eat a consistent number of servings per week of foods MEDIUM-HIGH in Vitamin K (1 serving = 1 cup)  Asparagus (cooked, or boiled & drained) Broccoli (cooked, boiled & drained, or raw & chopped) Brussel sprouts (cooked, or boiled & drained) *serving size only =  cup Lettuce, raw (green leaf, endive, romaine) Spinach, raw Turnip greens, raw & chopped   These websites have more information on Coumadin (warfarin):  FailFactory.se; VeganReport.com.au;

## 2017-08-23 NOTE — Progress Notes (Signed)
Physical Therapy Treatment Patient Details Name: Virginia Young MRN: [id number] DOB: 1941/04/29 Today's Date: 08/23/2017    History of Present Illness 77 y.o. female admitted on 08/22/17 for elective R reverse TSA and R carpal tunnel release.  Pt with significant PMH of non ischemic cardiomyopathy, MS, urinary incontinence, chronic systolic heart failure, and A-fib.    PT Comments    Patient is making gradual progress toward mobility goals. Pt is anxious about mobility and impulsive at times. Pt tolerated short distance gait in room. Overall pt required min/mod A for all mobility and c/o dizziness/nausea. Daughter present during session.  Pt has personal SPC in room and it is not adjusted correctly for her height. Therapist recommended readjusting SPC and pt did not like the idea however next session it may be necessary for safe use. Current plan remains appropriate.    Follow Up Recommendations  SNF     Equipment Recommendations  None recommended by PT    Recommendations for Other Services       Precautions / Restrictions Precautions Precautions: Fall Precaution Comments: pt with h/o falls Required Braces or Orthoses: Sling(R arm) Restrictions Weight Bearing Restrictions: Yes RUE Weight Bearing: Non weight bearing    Mobility  Bed Mobility Overal bed mobility: Needs Assistance Bed Mobility: Supine to Sit     Supine to sit: Min assist;HOB elevated     General bed mobility comments: cues for technique; HOB elevated  Transfers Overall transfer level: Needs assistance Equipment used: 1 person hand held assist;Straight cane Transfers: Sit to/from [facility] Sit to Stand: Mod assist;From elevated surface Stand pivot transfers: Min assist       General transfer comment: assist to power up into standing and to gain balance upon stand; cues for safety and hand placement; pt tends to sit impulsively despite cues  Ambulation/Gait Ambulation/Gait  assistance: Min assist;Mod assist Ambulation Distance (Feet): (short distance in room ) Assistive device: Straight cane Gait Pattern/deviations: Step-through pattern;Decreased stride length;Wide base of support     General Gait Details: cues for safe use of AD and assist for balance; pt's personal cane is too tall and therapist discussed lowering it next session for gait training   Stairs            Wheelchair Mobility    Modified Rankin (Stroke Patients Only)       Balance Overall balance assessment: Needs assistance Sitting-balance support: Feet supported;Single extremity supported Sitting balance-Leahy Scale: Fair     Standing balance support: Single extremity supported Standing balance-Leahy Scale: Poor                              Cognition Arousal/Alertness: Awake/alert Behavior During Therapy: WFL for tasks assessed/performed;Anxious Overall Cognitive Status: Within Functional Limits for tasks assessed                                 General Comments: anxious and impulsive when feeling the need to sit down      Exercises      General Comments General comments (skin integrity, edema, etc.): daughter present      Pertinent Vitals/Pain Pain Assessment: Faces Faces Pain Scale: Hurts even more Pain Location: R UE Pain Descriptors / Indicators: Grimacing;Guarding;Moaning;Sore Pain Intervention(s): Limited activity within patient's tolerance;Monitored during session;Premedicated before session;Repositioned;Ice applied    Home Living  Prior Function            PT Goals (current goals can now be found in the care plan section) Acute Rehab PT Goals Patient Stated Goal: to go home after a short rehab stay PT Goal Formulation: With patient Time For Goal Achievement: 08/29/17 Potential to Achieve Goals: Good Progress towards PT goals: Progressing toward goals    Frequency    Min 5X/week       PT Plan Current plan remains appropriate    Co-evaluation              AM-PAC PT "6 Clicks" Daily Activity  Outcome Measure  Difficulty turning over in bed (including adjusting bedclothes, sheets and blankets)?: Unable Difficulty moving from lying on back to sitting on the side of the bed? : Unable Difficulty sitting down on and standing up from a chair with arms (e.g., wheelchair, bedside commode, etc,.)?: Unable Help needed moving to and from a bed to chair (including a wheelchair)?: A Lot Help needed walking in hospital room?: A Lot Help needed climbing 3-5 steps with a railing? : A Lot 6 Click Score: 9    End of Session Equipment Utilized During Treatment: Gait belt Activity Tolerance: Other (comment)(limited by c/o dizziness and nasuea) Patient left: in chair;with call bell/phone within reach;with family/visitor present Nurse Communication: Mobility status PT Visit Diagnosis: History of falling (Z91.81);Difficulty in walking, not elsewhere classified (R26.2);Pain Pain - Right/Left: Right Pain - part of body: Shoulder     Time: 4193-7902 PT Time Calculation (min) (ACUTE ONLY): 24 min  Charges:  $Gait Training: 8-22 mins $Therapeutic Activity: 8-22 mins                    G Codes:       Earney Navy, PTA Pager: [phone]     Darliss Cheney 08/23/2017, 10:46 AM

## 2017-08-23 NOTE — Progress Notes (Signed)
   Subjective: 1 Day Post-Op Procedure(s) (LRB): REVERSE SHOULDER ARTHROPLASTY (Right) CARPAL TUNNEL RELEASE (Right) Patient reports pain as moderate Patient seen for Dr. Veverly Fells  Patient is well, and has had no acute complaints or problems other than discomfort in the right UE. She denies SOB and chest pain. Has some concerns about going home as she lives alone. Discussed with daughter and Dr. Veverly Fells about going to SNF.    Objective: Vital signs in last 24 hours: Temp:  [97.3 F (36.3 C)-98.7 F (37.1 C)] 98.6 F (37 C) (03/02 0748) Pulse Rate:  [73-95] 80 (03/02 0748) Resp:  [14-19] 16 (03/02 0748) BP: (126-168)/(73-97) 152/78 (03/02 0748) SpO2:  [94 %-100 %] 98 % (03/02 0748)  Intake/Output from previous day:  Intake/Output Summary (Last 24 hours) at 08/23/2017 0826 Last data filed at 08/23/2017 0750 Gross per 24 hour  Intake 2140 ml  Output 1900 ml  Net 240 ml    Intake/Output this shift: Total I/O In: 240 [P.O.:240] Out: -   Labs: Recent Labs    08/23/17 0532  HGB 11.4*   Recent Labs    08/23/17 0532  HCT 34.6*   Recent Labs    08/23/17 0532  NA 130*  K 3.7  CL 97*  CO2 21*  BUN 10  CREATININE 1.08*  GLUCOSE 243*  CALCIUM 7.6*   Recent Labs    08/22/17 0651 08/23/17 0532  INR 1.22 1.33    EXAM General - Patient is Alert and Oriented Extremity - Neurologically intact Sensation intact distally Intact pulses distally Dressing/Incision - clean, dry Motor Function - intact, moving hand and fingers well on exam.   Past Medical History:  Diagnosis Date  . Allergy   . Atrial fibrillation (HCC)    a. coumadin;  b. Amiodarone  . Cataract   . Chest pain 03/2012   a. Lex MV 10/13:  EF 64%, dist ant and apical defect sugg of soft tissue atten, no ischemia  . Chronic systolic heart failure ([place])   . Depression   . Dysrhythmia   . GERD (gastroesophageal reflux disease)   . HLD (hyperlipidemia)   . Incontinence of urine   . Mild mitral  regurgitation   . MS (multiple sclerosis) ([place])   . NICM (nonischemic cardiomyopathy) ([place])    a. neg CLite in 2003;  b. EF 40-45% in past;   c.  Echo 12/11: EF 35-40%, mild MR, mild LAE, mild RAE, small pericardial effusion   . Osteoarthritis   . Osteoporosis   . Stasis ulcer ([place])   . Uterine prolapse   . Vaginal prolapse without uterine prolapse   . Varicose veins     Assessment/Plan: 1 Day Post-Op Procedure(s) (LRB): REVERSE SHOULDER ARTHROPLASTY (Right) CARPAL TUNNEL RELEASE (Right) Active Problems:   S/P shoulder replacement, right  Estimated body mass index is 25.68 kg/m as calculated from the following:   Height as of this encounter: 5\' 10"  (1.778 m).   Weight as of this encounter: 81.2 kg (179 lb). Advance diet Up with therapy D/C IV fluids and monitor fluid intake due to hyponatremia   DVT Prophylaxis - Coumadin  Will keep fluids light due to hyponatremia. Will change dressing tomorrow. Plan for DC to SNF Monday. Remain in sling.   Virginia Jourdain, PA-C Orthopaedic Surgery 08/23/2017, 8:26 AM

## 2017-08-24 ENCOUNTER — Encounter (HOSPITAL_COMMUNITY): Payer: Self-pay | Admitting: *Deleted

## 2017-08-24 LAB — PROTIME-INR
INR: 1.25
Prothrombin Time: 15.6 seconds — ABNORMAL HIGH (ref 11.4–15.2)

## 2017-08-24 NOTE — Progress Notes (Signed)
Physical Therapy Treatment Patient Details Name: Virginia Young MRN: [id number] DOB: Aug 13, 1940 Today's Date: 08/24/2017    History of Present Illness 77 y.o. female admitted on 08/22/17 for elective R reverse TSA and R carpal tunnel release.  Pt with significant PMH of non ischemic cardiomyopathy, MS, urinary incontinence, chronic systolic heart failure, and A-fib.    PT Comments    Pt able to progress gait to short distance into hall.  Pt limited by urinary incontinence.  Plan for rehab at SNF remains appropriate based on patients level of function at this time.  Plan next session for continued strengthening and functional mobility to promote improved strength and independence.     Follow Up Recommendations  SNF     Equipment Recommendations  None recommended by PT    Recommendations for Other Services       Precautions / Restrictions Precautions Precautions: Shoulder;Fall Type of Shoulder Precautions: Conservative protocal. No shoulder ROM. WFL for elbow and hand ROM. No wrist ROM due to carpal tunnel surgery and wirst immobilized.  Shoulder Interventions: Shoulder sling/immobilizer;At all times;Off for dressing/bathing/exercises Precaution Comments: pt with h/o falls Required Braces or Orthoses: Sling Restrictions Weight Bearing Restrictions: Yes RUE Weight Bearing: Non weight bearing    Mobility  Bed Mobility               General bed mobility comments: Pt sitting in recliner on arrival.    Transfers Overall transfer level: Needs assistance Equipment used: None;1 person hand held assist Transfers: Sit to/from Stand Sit to Stand: Mod assist         General transfer comment: Mod A for power up into standing and balance.   Ambulation/Gait Ambulation/Gait assistance: Mod assist Ambulation Distance (Feet): 30 Feet Assistive device: Straight cane Gait Pattern/deviations: Step-through pattern;Decreased stride length;Wide base of support   Gait velocity  interpretation: Below normal speed for age/gender General Gait Details: Cues for weight shifting and increasing stride length.  Pt with urinary incontinence during gait training.  Pt required cues for increasing gait distance with close chair follow as she is unsteady.    Stairs            Wheelchair Mobility    Modified Rankin (Stroke Patients Only)       Balance Overall balance assessment: Needs assistance   Sitting balance-Leahy Scale: Fair       Standing balance-Leahy Scale: Poor                              Cognition Arousal/Alertness: Awake/alert Behavior During Therapy: Anxious;WFL for tasks assessed/performed Overall Cognitive Status: Within Functional Limits for tasks assessed                                        Exercises Total Joint Exercises Towel Squeeze: AROM;Both;Supine;10 reps General Exercises - Lower Extremity Ankle Circles/Pumps: AROM;Both;20 reps;Supine Quad Sets: AROM;Both;10 reps;Supine Short Arc Quad: AROM;Both;Supine;10 reps Heel Slides: AROM;Both;10 reps;Supine Hip ABduction/ADduction: AROM;Both;10 reps;Supine Straight Leg Raises: AROM;Both;10 reps;Supine    General Comments        Pertinent Vitals/Pain Pain Score: 6  Pain Location: r shoulder Pain Descriptors / Indicators: Aching Pain Intervention(s): Monitored during session;Repositioned    Home Living                      Prior Function  PT Goals (current goals can now be found in the care plan section) Acute Rehab PT Goals Patient Stated Goal: to go home after a short rehab stay Potential to Achieve Goals: Good Progress towards PT goals: Progressing toward goals    Frequency    Min 5X/week      PT Plan Current plan remains appropriate    Co-evaluation              AM-PAC PT "6 Clicks" Daily Activity  Outcome Measure  Difficulty turning over in bed (including adjusting bedclothes, sheets and blankets)?:  Unable Difficulty moving from lying on back to sitting on the side of the bed? : Unable Difficulty sitting down on and standing up from a chair with arms (e.g., wheelchair, bedside commode, etc,.)?: Unable Help needed moving to and from a bed to chair (including a wheelchair)?: A Lot Help needed walking in hospital room?: A Lot Help needed climbing 3-5 steps with a railing? : A Lot 6 Click Score: 9    End of Session Equipment Utilized During Treatment: Gait belt Activity Tolerance: Patient tolerated treatment well Patient left: in chair;with call bell/phone within reach;with nursing/sitter in room Nurse Communication: Mobility status(NT present and placed purewick post session.  ) PT Visit Diagnosis: History of falling (Z91.81);Difficulty in walking, not elsewhere classified (R26.2);Pain Pain - Right/Left: Right Pain - part of body: Shoulder     Time: 5093-2671 PT Time Calculation (min) (ACUTE ONLY): 21 min  Charges:  $Gait Training: 8-22 mins                    G Codes:       Governor Rooks, PTA pager [phone]    Cristela Blue 08/24/2017, 4:50 PM

## 2017-08-24 NOTE — Progress Notes (Signed)
Occupational Therapy Treatment Patient Details Name: Virginia Young MRN: [id number] DOB: Nov 04, 1940 Today's Date: 08/24/2017    History of present illness 77 y.o. female admitted on 08/22/17 for elective R reverse TSA and R carpal tunnel release.  Pt with significant PMH of non ischemic cardiomyopathy, MS, urinary incontinence, chronic systolic heart failure, and A-fib.   OT comments  Pt. Making slow gains with skilled OT. Able to complete bed mobility and stand pivot to recliner with increased need for physical assistance secondary to impulsive behaviors and c/o pain.  Unable to tolerate elbow or hand rom today.    Follow Up Recommendations  SNF;Supervision/Assistance - 24 hour    Equipment Recommendations       Recommendations for Other Services      Precautions / Restrictions Precautions Precautions: Shoulder;Fall Type of Shoulder Precautions: Conservative protocal. No shoulder ROM. WFL for elbow and hand ROM. No wrist ROM due to carpal tunnel surgery and wirst immobilized.  Shoulder Interventions: Shoulder sling/immobilizer;At all times;Off for dressing/bathing/exercises Precaution Comments: pt with h/o falls Required Braces or Orthoses: Sling Restrictions RUE Weight Bearing: Non weight bearing       Mobility Bed Mobility Overal bed mobility: Needs Assistance Bed Mobility: Supine to Sit     Supine to sit: Max assist     General bed mobility comments: pt. able to bring legs eob but unable to pull trunk upright, cont. to grab on the therapist asst. for support, required use of bed pad to bring b les eob and pt. required assistance during sitting eob  Transfers Overall transfer level: Needs assistance Equipment used: 2 person hand held assist Transfers: Sit to/from [facility] Sit to Stand: Mod assist;+2 physical assistance Stand pivot transfers: Mod assist;+2 physical assistance       General transfer comment: Mod A for power up into standing and  balance.     Balance Overall balance assessment: Independent Sitting-balance support: Feet supported;Single extremity supported Sitting balance-Leahy Scale: Fair     Standing balance support: Single extremity supported Standing balance-Leahy Scale: Poor                             ADL either performed or assessed with clinical judgement   ADL Overall ADL's : Needs assistance/impaired                 Upper Body Dressing : Maximal assistance;Sitting       Toilet Transfer: Moderate assistance;+2 for physical assistance Toilet Transfer Details (indicate cue type and reason): simulated stand pivot from eob to recliner         Functional mobility during ADLs: Moderate assistance;+2 for physical assistance General ADL Comments: pt. with c/o dizziness, rn present and aware.  unable to manage unsupported sitting this day eob.  sit/stand x2 pt. sitting without warning, required 2 person assist for stand pivot for safety.       Vision       Perception     Praxis      Cognition Arousal/Alertness: Awake/alert Behavior During Therapy: Impulsive;Anxious                                   General Comments: anxious and impulsive when feeling the need to sit down        Exercises     Shoulder Instructions       General Comments  Pertinent Vitals/ Pain       Pain Assessment: 0-10 Pain Score: 5  Pain Location: r shoulder Pain Descriptors / Indicators: Aching Pain Intervention(s): Limited activity within patient's tolerance;Monitored during session;Premedicated before session;Repositioned  Home Living                                          Prior Functioning/Environment              Frequency  Min 2X/week        Progress Toward Goals  OT Goals(current goals can now be found in the care plan section)  Progress towards OT goals: Progressing toward goals     Plan Discharge plan remains appropriate     Co-evaluation                 AM-PAC PT "6 Clicks" Daily Activity     Outcome Measure   Help from another person eating meals?: None Help from another person taking care of personal grooming?: A Little Help from another person toileting, which includes using toliet, bedpan, or urinal?: A Lot Help from another person bathing (including washing, rinsing, drying)?: A Lot Help from another person to put on and taking off regular upper body clothing?: A Lot Help from another person to put on and taking off regular lower body clothing?: A Lot 6 Click Score: 15    End of Session Equipment Utilized During Treatment: Other (comment)(sling)  OT Visit Diagnosis: Unsteadiness on feet (R26.81);Other abnormalities of gait and mobility (R26.89);Muscle weakness (generalized) (M62.81);Pain Pain - Right/Left: Right Pain - part of body: Shoulder   Activity Tolerance Patient limited by pain   Patient Left in chair;with call bell/phone within reach   Nurse Communication Mobility status;Other (comment)(reviewed 2 person assistance needed for stand pivot today)        Time: 6503-5465 OT Time Calculation (min): 17 min  Charges: OT General Charges $OT Visit: 1 Visit OT Treatments $Self Care/Home Management : 8-22 mins   Janice Coffin, COTA/L 08/24/2017, 9:40 AM

## 2017-08-24 NOTE — Progress Notes (Signed)
Subjective: 2 Days Post-Op Procedure(s) (LRB): REVERSE SHOULDER ARTHROPLASTY (Right) CARPAL TUNNEL RELEASE (Right) Patient reports pain as moderate to right shoulder.  tolerating PO's. Denis CP or SOB.  Objective: Vital signs in last 24 hours: Temp:  [98.3 F (36.8 C)-98.6 F (37 C)] 98.6 F (37 C) (03/03 0511) Pulse Rate:  [79-98] 93 (03/03 0511) Resp:  [18] 18 (03/02 1500) BP: (131-159)/(75-86) 138/86 (03/03 0511) SpO2:  [92 %-96 %] 92 % (03/03 0511)  Intake/Output from previous day: 03/02 0701 - 03/03 0700 In: 720 [P.O.:720] Out: 3650 [Urine:3650] Intake/Output this shift: No intake/output data recorded.  Recent Labs    08/23/17 0532  HGB 11.4*   Recent Labs    08/23/17 0532  HCT 34.6*   Recent Labs    08/23/17 0532  NA 130*  K 3.7  CL 97*  CO2 21*  BUN 10  CREATININE 1.08*  GLUCOSE 243*  CALCIUM 7.6*   Recent Labs    08/22/17 0651 08/23/17 0532  INR 1.22 1.33    Well nourished. Alert and oriented x3. RRR, Lungs clear, BS x4. Abdomen soft and non tender. Right Calf soft and non tender. Right shoulder dressing C/D/I. No DVT signs. Compartment soft. No signs of infection.  Right LE grossly neurovascular intact. Splint on right wrist.  Assessment/Plan: 2 Days Post-Op Procedure(s) (LRB): REVERSE SHOULDER ARTHROPLASTY (Right) CARPAL TUNNEL RELEASE (Right) OT/PT Plan for possible SNF at d/c Pain management Continue current care  Skylen Danielsen L 08/24/2017, 9:26 AM

## 2017-08-25 ENCOUNTER — Encounter (HOSPITAL_COMMUNITY): Payer: Self-pay | Admitting: Orthopedic Surgery

## 2017-08-25 DIAGNOSIS — G5601 Carpal tunnel syndrome, right upper limb: Secondary | ICD-10-CM | POA: Diagnosis not present

## 2017-08-25 DIAGNOSIS — N814 Uterovaginal prolapse, unspecified: Secondary | ICD-10-CM | POA: Diagnosis not present

## 2017-08-25 DIAGNOSIS — E782 Mixed hyperlipidemia: Secondary | ICD-10-CM | POA: Diagnosis not present

## 2017-08-25 DIAGNOSIS — D688 Other specified coagulation defects: Secondary | ICD-10-CM | POA: Diagnosis not present

## 2017-08-25 DIAGNOSIS — M6281 Muscle weakness (generalized): Secondary | ICD-10-CM | POA: Diagnosis not present

## 2017-08-25 DIAGNOSIS — M810 Age-related osteoporosis without current pathological fracture: Secondary | ICD-10-CM | POA: Diagnosis not present

## 2017-08-25 DIAGNOSIS — I499 Cardiac arrhythmia, unspecified: Secondary | ICD-10-CM | POA: Diagnosis not present

## 2017-08-25 DIAGNOSIS — Z471 Aftercare following joint replacement surgery: Secondary | ICD-10-CM | POA: Diagnosis not present

## 2017-08-25 DIAGNOSIS — H259 Unspecified age-related cataract: Secondary | ICD-10-CM | POA: Diagnosis not present

## 2017-08-25 DIAGNOSIS — E559 Vitamin D deficiency, unspecified: Secondary | ICD-10-CM | POA: Diagnosis not present

## 2017-08-25 DIAGNOSIS — G35 Multiple sclerosis: Secondary | ICD-10-CM | POA: Diagnosis not present

## 2017-08-25 DIAGNOSIS — E785 Hyperlipidemia, unspecified: Secondary | ICD-10-CM | POA: Diagnosis not present

## 2017-08-25 DIAGNOSIS — F329 Major depressive disorder, single episode, unspecified: Secondary | ICD-10-CM | POA: Diagnosis not present

## 2017-08-25 DIAGNOSIS — D518 Other vitamin B12 deficiency anemias: Secondary | ICD-10-CM | POA: Diagnosis not present

## 2017-08-25 DIAGNOSIS — E039 Hypothyroidism, unspecified: Secondary | ICD-10-CM | POA: Diagnosis not present

## 2017-08-25 DIAGNOSIS — R262 Difficulty in walking, not elsewhere classified: Secondary | ICD-10-CM | POA: Diagnosis not present

## 2017-08-25 DIAGNOSIS — D649 Anemia, unspecified: Secondary | ICD-10-CM | POA: Diagnosis not present

## 2017-08-25 DIAGNOSIS — I420 Dilated cardiomyopathy: Secondary | ICD-10-CM | POA: Diagnosis not present

## 2017-08-25 DIAGNOSIS — I340 Nonrheumatic mitral (valve) insufficiency: Secondary | ICD-10-CM | POA: Diagnosis not present

## 2017-08-25 DIAGNOSIS — Z4889 Encounter for other specified surgical aftercare: Secondary | ICD-10-CM | POA: Diagnosis not present

## 2017-08-25 DIAGNOSIS — R41841 Cognitive communication deficit: Secondary | ICD-10-CM | POA: Diagnosis not present

## 2017-08-25 DIAGNOSIS — Z79899 Other long term (current) drug therapy: Secondary | ICD-10-CM | POA: Diagnosis not present

## 2017-08-25 DIAGNOSIS — E7800 Pure hypercholesterolemia, unspecified: Secondary | ICD-10-CM | POA: Diagnosis not present

## 2017-08-25 DIAGNOSIS — N816 Rectocele: Secondary | ICD-10-CM | POA: Diagnosis not present

## 2017-08-25 DIAGNOSIS — I5022 Chronic systolic (congestive) heart failure: Secondary | ICD-10-CM | POA: Diagnosis not present

## 2017-08-25 DIAGNOSIS — G8911 Acute pain due to trauma: Secondary | ICD-10-CM | POA: Diagnosis not present

## 2017-08-25 DIAGNOSIS — K219 Gastro-esophageal reflux disease without esophagitis: Secondary | ICD-10-CM | POA: Diagnosis not present

## 2017-08-25 DIAGNOSIS — M1990 Unspecified osteoarthritis, unspecified site: Secondary | ICD-10-CM | POA: Diagnosis not present

## 2017-08-25 DIAGNOSIS — E119 Type 2 diabetes mellitus without complications: Secondary | ICD-10-CM | POA: Diagnosis not present

## 2017-08-25 DIAGNOSIS — Z96611 Presence of right artificial shoulder joint: Secondary | ICD-10-CM | POA: Diagnosis not present

## 2017-08-25 DIAGNOSIS — I480 Paroxysmal atrial fibrillation: Secondary | ICD-10-CM | POA: Diagnosis not present

## 2017-08-25 DIAGNOSIS — S4990XA Unspecified injury of shoulder and upper arm, unspecified arm, initial encounter: Secondary | ICD-10-CM | POA: Diagnosis not present

## 2017-08-25 LAB — PROTIME-INR
INR: 1.2
Prothrombin Time: 15.1 seconds (ref 11.4–15.2)

## 2017-08-25 NOTE — Progress Notes (Signed)
Physical Therapy Treatment Patient Details Name: Virginia Young MRN: [id number] DOB: 01/02/1941 Today's Date: 08/25/2017    History of Present Illness 77 y.o. female admitted on 08/22/17 for elective R reverse TSA and R carpal tunnel release.  Pt with significant PMH of non ischemic cardiomyopathy, MS, urinary incontinence, chronic systolic heart failure, and A-fib.    PT Comments    Patient is making progress toward mobility goals. Pt nauseous with emesis during session. Grandson present throughout and pushed chair behind for safe gait training. Pt continues to require mod A for functional transfers and min A +2 for safe ambulation. Continue to progress as tolerated with anticipated d/c to SNF for further skilled PT services.     Follow Up Recommendations  SNF     Equipment Recommendations  None recommended by PT    Recommendations for Other Services       Precautions / Restrictions Precautions Precautions: Shoulder;Fall Type of Shoulder Precautions: Conservative protocal. No shoulder ROM. WFL for elbow and hand ROM. No wrist ROM due to carpal tunnel surgery and wirst immobilized.  Shoulder Interventions: Shoulder sling/immobilizer;At all times;Off for dressing/bathing/exercises Precaution Comments: pt with h/o falls Required Braces or Orthoses: Sling Restrictions Weight Bearing Restrictions: Yes RUE Weight Bearing: Non weight bearing    Mobility  Bed Mobility               General bed mobility comments: pt OOB in chair upon arrival  Transfers Overall transfer level: Needs assistance Equipment used: 1 person hand held assist Transfers: Sit to/from Stand Sit to Stand: Mod assist         General transfer comment: mod a to power up into standing and to gain balance upon stand; SPC used upon standing; cues for technique  Ambulation/Gait Ambulation/Gait assistance: +2 safety/equipment;Min assist Ambulation Distance (Feet): (77ft then 31ft) Assistive device:  Straight cane Gait Pattern/deviations: Step-through pattern;Decreased stride length;Wide base of support;Decreased step length - right;Decreased step length - left Gait velocity: decreased   General Gait Details: multimodal cues for posture and vc for increased bilat step length and sequencing; underwear and pad donned prior to ambulating due to urinary incontinence; grandson present and pushed chair behind for safety   Stairs            Wheelchair Mobility    Modified Rankin (Stroke Patients Only)       Balance Overall balance assessment: Needs assistance Sitting-balance support: Feet supported;Single extremity supported Sitting balance-Leahy Scale: Fair     Standing balance support: Single extremity supported Standing balance-Leahy Scale: Poor                              Cognition Arousal/Alertness: Awake/alert Behavior During Therapy: WFL for tasks assessed/performed Overall Cognitive Status: Within Functional Limits for tasks assessed                                        Exercises      General Comments General comments (skin integrity, edema, etc.): pt nauseated with emesis during session       Pertinent Vitals/Pain Pain Assessment: Faces Faces Pain Scale: Hurts even more Pain Location: r shoulder Pain Descriptors / Indicators: Aching;Guarding Pain Intervention(s): Limited activity within patient's tolerance;Monitored during session;Repositioned    Home Living  Prior Function            PT Goals (current goals can now be found in the care plan section) Acute Rehab PT Goals Patient Stated Goal: to go home after a short rehab stay PT Goal Formulation: With patient Time For Goal Achievement: 08/29/17 Potential to Achieve Goals: Good Progress towards PT goals: Progressing toward goals    Frequency    Min 5X/week      PT Plan Current plan remains appropriate    Co-evaluation               AM-PAC PT "6 Clicks" Daily Activity  Outcome Measure  Difficulty turning over in bed (including adjusting bedclothes, sheets and blankets)?: Unable Difficulty moving from lying on back to sitting on the side of the bed? : Unable Difficulty sitting down on and standing up from a chair with arms (e.g., wheelchair, bedside commode, etc,.)?: Unable Help needed moving to and from a bed to chair (including a wheelchair)?: A Lot Help needed walking in hospital room?: A Lot Help needed climbing 3-5 steps with a railing? : A Lot 6 Click Score: 9    End of Session Equipment Utilized During Treatment: Gait belt Activity Tolerance: Patient tolerated treatment well Patient left: in chair;with call bell/phone within reach;with family/visitor present Nurse Communication: Mobility status PT Visit Diagnosis: History of falling (Z91.81);Difficulty in walking, not elsewhere classified (R26.2);Pain Pain - Right/Left: Right Pain - part of body: Shoulder     Time: 3474-2595 PT Time Calculation (min) (ACUTE ONLY): 33 min  Charges:  $Gait Training: 8-22 mins $Therapeutic Activity: 8-22 mins                    G Codes:       Earney Navy, PTA Pager: [phone]     Darliss Cheney 08/25/2017, 12:00 PM

## 2017-08-25 NOTE — Social Work (Signed)
CSW confirmed SNF bed offer with [facility].  CSW f/u for discharge summary.  Elissa Hefty, LCSW Clinical Social Worker [phone]

## 2017-08-25 NOTE — [place] FL2 (Signed)
[place] MEDICAID FL2 LEVEL OF CARE SCREENING TOOL     IDENTIFICATION  Patient Name: Virginia Young Birthdate: 22-Nov-1940 Sex: female Admission Date (Current Location): 08/22/2017  [facility] and [place] Number:  Herbalist and Address:  [facility] [place], [place] [address] [place], [place] [address]      Provider Number: [id number]  Attending Physician Name and Address:  [name], MD  Relative Name and Phone Number:  Charlynn Grimes, [phone]    Current Level of Care: Hospital Recommended Level of Care: [place] Prior Approval Number:    Date Approved/Denied:   PASRR Number: [id number]  Discharge Plan: SNF    Current Diagnoses: Patient Active Problem List   Diagnosis Date Noted  . S/P shoulder replacement, right 08/22/2017  . HTN (hypertension) 02/19/2016  . Post-nasal drip 05/15/2015  . Postoperative abdominal pain 12/07/2014  . Voiding dysfunction 10/07/2014  . SUI (stress urinary incontinence, female) 10/07/2014  . Osteoporosis with fracture 04/06/2014  . Impacted cerumen of both ears 08/20/2013  . Encounter for therapeutic drug monitoring 08/20/2013  . DOE (dyspnea on exertion) 07/23/2013  . Helicobacter positive gastritis 06/21/2013  . Orthostatic hypotension 06/10/2013  . UTI (lower urinary tract infection) 05/06/2013  . Neck mass 02/19/2013  . Neck pain, bilateral 02/19/2013  . Right groin mass 11/08/2011  . Low back pain 11/08/2011  . Lipoma of neck 06/21/2011  . Memory loss 06/21/2011  . OVERACTIVE BLADDER 08/21/2010  . Long term current use of anticoagulant 07/25/2010  . Osteoarthritis 01/18/2010  . KNEE PAIN 01/18/2010  . FOOT PAIN 01/18/2010  . Hyperlipidemia with target LDL less than 100 04/20/2009  . GERD 04/17/2009  . MITRAL REGURGITATION, MILD 08/30/2008  . NICM (nonischemic cardiomyopathy) ([place]) 08/30/2008  . Congestive heart failure ([place]) 04/13/2007  . DEPRESSION 04/10/2007   . DISEASE, MITRAL VALVE NEC/NOS 04/10/2007  . Atrial fibrillation ([place]) 04/10/2007  . STASIS ULCER 04/10/2007  . VENOUS INSUFFICIENCY, LEGS 04/10/2007    Orientation RESPIRATION BLADDER Height & Weight     Self, Time, Situation, Place  Normal Incontinent Weight: 179 lb (81.2 kg) Height:  5\' 10"  (177.8 cm)  BEHAVIORAL SYMPTOMS/MOOD NEUROLOGICAL BOWEL NUTRITION STATUS      Continent Diet(See DC Summary)  AMBULATORY STATUS COMMUNICATION OF NEEDS Skin   Extensive Assist Verbally Surgical wounds                       Personal Care Assistance Level of Assistance  Dressing, Bathing, Feeding Bathing Assistance: Maximum assistance Feeding assistance: Limited assistance Dressing Assistance: Maximum assistance     Functional Limitations Info  Sight, Hearing, Speech Sight Info: Adequate Hearing Info: Adequate Speech Info: Adequate    SPECIAL CARE FACTORS FREQUENCY  PT (By licensed PT), OT (By licensed OT)     PT Frequency: 5x week OT Frequency: 5x week            Contractures      Additional Factors Info  Code Status, Allergies Code Status Info: Full Allergies Info: BACTRIM SULFAMETHOXAZOLE-TRIMETHOPRIM, LIPITOR ATORVASTATIN CALCIUM, LISINOPRIL            Current Medications (08/25/2017):  This is the current hospital active medication list Current Facility-Administered Medications  Medication Dose Route Frequency Provider Last Rate Last Dose  . acetaminophen (TYLENOL) tablet 325-650 mg  325-650 mg Oral Q6H PRN [name], MD      . bisacodyl (DULCOLAX) suppository 10 mg  10 mg Rectal Daily PRN [name], MD      .  docusate sodium (COLACE) capsule 100 mg  100 mg Oral BID [name], MD   100 mg at 08/25/17 1059  . hydrochlorothiazide (HYDRODIURIL) tablet 12.5 mg  12.5 mg Oral Daily [name], MD   12.5 mg at 08/25/17 1059  . HYDROcodone-acetaminophen (NORCO/VICODIN) 5-325 MG per tablet 1-2 tablet  1-2 tablet Oral Q4H PRN [name], MD   1 tablet  at 08/24/17 2137  . linaclotide (LINZESS) capsule 290 mcg  290 mcg Oral QAC breakfast [name], MD   290 mcg at 08/25/17 [phone]  . meclizine (ANTIVERT) tablet 25 mg  25 mg Oral TID PRN [name], MD      . menthol-cetylpyridinium (CEPACOL) lozenge 3 mg  1 lozenge Oral PRN [name], MD       Or  . phenol (CHLORASEPTIC) mouth spray 1 spray  1 spray Mouth/Throat PRN [name], MD      . metoCLOPramide (REGLAN) tablet 5-10 mg  5-10 mg Oral Q8H PRN [name], MD       Or  . metoCLOPramide (REGLAN) injection 5-10 mg  5-10 mg Intravenous Q8H PRN [name], MD      . morphine 2 MG/ML injection 0.5-1 mg  0.5-1 mg Intravenous Q2H PRN [name], MD      . ondansetron [facility]) tablet 4 mg  4 mg Oral Q6H PRN [name], MD       Or  . ondansetron [facility]) injection 4 mg  4 mg Intravenous Q6H PRN [name], MD   4 mg at 08/22/17 1447  . polyethylene glycol (MIRALAX / GLYCOLAX) packet 17 g  17 g Oral Daily PRN [name], MD      . traMADol [facility]) tablet 50 mg  50 mg Oral Q6H [name], MD   50 mg at 08/25/17 1105  . warfarin (COUMADIN) tablet 3 mg  3 mg Oral q1800 [name], MD   3 mg at 08/24/17 1739  . Warfarin - Physician Dosing Inpatient   Does not apply q1800 [name], MD         Discharge Medications: Please see discharge summary for a list of discharge medications.  Relevant Imaging Results:  Relevant Lab Results:   Additional Information SS#: [place], LCSW

## 2017-08-25 NOTE — Progress Notes (Signed)
Orthopedics Progress Note  Subjective: Patient complaining of some shoulder pain. Desires SNF, lives alone  Objective:  Vitals:   08/24/17 2010 08/25/17 0515  BP: 124/68 134/88  Pulse: 90 78  Resp: 17 18  Temp: 98.3 F (36.8 C) 98.8 F (37.1 C)  SpO2: 96% 96%    General: Awake and alert  Musculoskeletal: Shoulder dressing changed, no drainage and no erythema Neurovascularly intact  Lab Results  Component Value Date   WBC 8.1 08/18/2017   HGB 11.4 (L) 08/23/2017   HCT 34.6 (L) 08/23/2017   MCV 88.1 08/18/2017   PLT 249 08/18/2017       Component Value Date/Time   NA 130 (L) 08/23/2017 0532   NA 137 04/15/2017 1117   K 3.7 08/23/2017 0532   CL 97 (L) 08/23/2017 0532   CO2 21 (L) 08/23/2017 0532   GLUCOSE 243 (H) 08/23/2017 0532   BUN 10 08/23/2017 0532   BUN 11 04/15/2017 1117   CREATININE 1.08 (H) 08/23/2017 0532   CALCIUM 7.6 (L) 08/23/2017 0532   GFRNONAA 48 (L) 08/23/2017 0532   GFRAA 56 (L) 08/23/2017 0532    Lab Results  Component Value Date   INR 1.20 08/25/2017   INR 1.25 08/24/2017   INR 1.33 08/23/2017    Assessment/Plan: POD #3 s/p Procedure(s): REVERSE SHOULDER ARTHROPLASTY CARPAL TUNNEL RELEASE Plan discharge to SNF today - need FL-2  Social Work consult pending  Remo Lipps R. Veverly Fells, MD 08/25/2017 9:19 AM

## 2017-08-25 NOTE — Social Work (Signed)
Clinical Social Worker facilitated patient discharge including contacting patient family and facility to confirm patient discharge plans.  Clinical information faxed to facility and family agreeable with plan.    CSW arranged ambulance transport via [facility] to [facility] .    RN to call [phone] to give report prior to discharge.  Clinical Social Worker will sign off for now as social work intervention is no longer needed. Please consult [place] again if new need arises.  Elissa Hefty, LCSW Clinical Social Worker [phone]

## 2017-08-25 NOTE — Progress Notes (Signed)
Occupational Therapy Treatment Patient Details Name: Virginia Young MRN: [id number] DOB: 1941/02/17 Today's Date: 08/25/2017    History of present illness 77 y.o. female admitted on 08/22/17 for elective R reverse TSA and R carpal tunnel release.  Pt with significant PMH of non ischemic cardiomyopathy, MS, urinary incontinence, chronic systolic heart failure, and A-fib.   OT comments  Pt making progress with functional goals. Very pleasant and cooperative. OT will continue to follow acutely  Follow Up Recommendations  SNF;Supervision/Assistance - 24 hour    Equipment Recommendations  Other (comment)(TBD at next venue of care)    Recommendations for Other Services      Precautions / Restrictions Precautions Precautions: Shoulder;Fall Type of Shoulder Precautions: Conservative protocal. No shoulder ROM. WFL for elbow and hand ROM. No wrist ROM due to carpal tunnel surgery and wirst immobilized.  Shoulder Interventions: Shoulder sling/immobilizer;At all times;Off for dressing/bathing/exercises Precaution Comments: pt with h/o falls Required Braces or Orthoses: Sling Restrictions Weight Bearing Restrictions: Yes RUE Weight Bearing: Non weight bearing       Mobility Bed Mobility Overal bed mobility: Needs Assistance Bed Mobility: Supine to Sit     Supine to sit: Max assist     General bed mobility comments: pt OOB in chair upon arrival  Transfers Overall transfer level: Needs assistance Equipment used: 1 person hand held assist Transfers: Sit to/from Stand Sit to Stand: Mod assist Stand pivot transfers: Mod assist;+2 physical assistance       General transfer comment: mod a to power up into standing and to gain balance upon stand; SPC used upon standing; cues for technique    Balance Overall balance assessment: Needs assistance Sitting-balance support: Feet supported;Single extremity supported Sitting balance-Leahy Scale: Fair     Standing balance support:  Single extremity supported Standing balance-Leahy Scale: Poor                             ADL either performed or assessed with clinical judgement   ADL Overall ADL's : Needs assistance/impaired             Lower Body Bathing: Moderate assistance;Sitting/lateral leans       Lower Body Dressing: Maximal assistance;Sit to/from stand   Toilet Transfer: Moderate assistance;+2 for physical assistance;BSC   Toileting- Clothing Manipulation and Hygiene: Total assistance       Functional mobility during ADLs: Moderate assistance;+2 for physical assistance       Vision Baseline Vision/History: Wears glasses Patient Visual Report: No change from baseline     Perception     Praxis      Cognition Arousal/Alertness: Awake/alert Behavior During Therapy: WFL for tasks assessed/performed Overall Cognitive Status: Within Functional Limits for tasks assessed                                          Exercises Other Exercises Other Exercises: ROM of R elbow and digits   Shoulder Instructions       General Comments pt nauseated with emesis during session     Pertinent Vitals/ Pain       Pain Assessment: 0-10 Pain Score: 5  Faces Pain Scale: Hurts even more Pain Location: r shoulder Pain Descriptors / Indicators: Aching;Guarding Pain Intervention(s): Limited activity within patient's tolerance;Monitored during session;Repositioned  Home Living  Prior Functioning/Environment              Frequency  Min 2X/week        Progress Toward Goals  OT Goals(current goals can now be found in the care plan section)  Progress towards OT goals: Progressing toward goals  Acute Rehab OT Goals Patient Stated Goal: to go home after a short rehab stay  Plan Discharge plan remains appropriate    Co-evaluation                 AM-PAC PT "6 Clicks" Daily Activity     Outcome  Measure   Help from another person eating meals?: None Help from another person taking care of personal grooming?: A Little Help from another person toileting, which includes using toliet, bedpan, or urinal?: A Lot Help from another person bathing (including washing, rinsing, drying)?: A Lot Help from another person to put on and taking off regular upper body clothing?: A Lot Help from another person to put on and taking off regular lower body clothing?: A Lot 6 Click Score: 15    End of Session Equipment Utilized During Treatment: Other (comment)(sling)  OT Visit Diagnosis: Unsteadiness on feet (R26.81);Other abnormalities of gait and mobility (R26.89);Muscle weakness (generalized) (M62.81);Pain Pain - Right/Left: Right Pain - part of body: Shoulder   Activity Tolerance Patient tolerated treatment well   Patient Left with nursing/sitter in room   Nurse Communication      Functional Assessment Tool Used: AM-PAC 6 Clicks Daily Activity   Time: 3845-3646 OT Time Calculation (min): 25 min  Charges: OT G-codes **NOT FOR INPATIENT CLASS** Functional Assessment Tool Used: AM-PAC 6 Clicks Daily Activity OT General Charges $OT Visit: 1 Visit OT Treatments $Self Care/Home Management : 8-22 mins $Therapeutic Exercise: 8-22 mins     Britt Bottom 08/25/2017, 1:30 PM

## 2017-08-25 NOTE — Discharge Summary (Signed)
Orthopedic Discharge Summary        Physician Discharge Summary  Patient ID: Virginia Young MRN: [id number] DOB/AGE: 1940/12/05 77 y.o.  Admit date: 08/22/2017 Discharge date: 08/25/2017   Procedures:  Procedure(s) (LRB): REVERSE SHOULDER ARTHROPLASTY (Right) CARPAL TUNNEL RELEASE (Right)  Attending Physician:  Dr. Esmond Plants  Admission Diagnoses:   Right shoulder rotator cuff tear arthropathy, Right carpal tunnel syndrome  Discharge Diagnoses:  Right shoulder rotator cuff tear arthropathy, Right carpal tunnel syndrome   Past Medical History:  Diagnosis Date  . Allergy   . Atrial fibrillation (HCC)    a. coumadin;  b. Amiodarone  . Cataract   . Chest pain 03/2012   a. Lex MV 10/13:  EF 64%, dist ant and apical defect sugg of soft tissue atten, no ischemia  . Chronic systolic heart failure ([place])   . Depression   . Dysrhythmia   . GERD (gastroesophageal reflux disease)   . HLD (hyperlipidemia)   . Incontinence of urine   . Mild mitral regurgitation   . MS (multiple sclerosis) ([place])   . NICM (nonischemic cardiomyopathy) ([place])    a. neg CLite in 2003;  b. EF 40-45% in past;   c.  Echo 12/11: EF 35-40%, mild MR, mild LAE, mild RAE, small pericardial effusion   . Osteoarthritis   . Osteoporosis   . Stasis ulcer ([place])   . Uterine prolapse   . Vaginal prolapse without uterine prolapse   . Varicose veins     PCP: Virginia Maize, MD   Discharged Condition: good  Hospital Course:  Patient underwent the above stated procedure on 08/22/2017. Patient tolerated the procedure well and brought to the recovery room in good condition and subsequently to the floor. Patient underwent PT and OT and was making good progress but is not safe to be alone at home at this point functionally.  Will need short term SNF rehab. Patient had an uncomplicated hospital course and was stable for discharge.   Disposition: SNF with follow up in 2 weeks   Follow-up Information    [name], MD. Call in 2 weeks.   Specialty:  Orthopedic Surgery Why:  [phone] Contact information: [address] [place] [address] [phone]           Discharge Instructions    Call MD / Call 911   Complete by:  As directed    If you experience chest pain or shortness of breath, CALL 911 and be transported to the hospital emergency room.  If you develope a fever above 101 F, pus (white drainage) or increased drainage or redness at the wound, or calf pain, call your surgeon's office.   Constipation Prevention   Complete by:  As directed    Drink plenty of fluids.  Prune juice may be helpful.  You may use a stool softener, such as Colace (over the counter) 100 mg twice a day.  Use MiraLax (over the counter) for constipation as needed.   Diet - low sodium heart healthy   Complete by:  As directed    Increase activity slowly as tolerated   Complete by:  As directed       Allergies as of 08/25/2017      Reactions   Bactrim [sulfamethoxazole-trimethoprim]    Mouth sores   Lipitor [atorvastatin Calcium]    myalgia   Lisinopril    cough      Medication List    STOP taking these medications  alendronate 70 MG tablet Commonly known as:  FOSAMAX   benzonatate 200 MG capsule Commonly known as:  TESSALON   losartan 50 MG tablet Commonly known as:  COZAAR     TAKE these medications   hydrochlorothiazide 12.5 MG tablet Commonly known as:  HYDRODIURIL Take 1 tablet (12.5 mg total) by mouth daily.   HYDROcodone-acetaminophen 5-325 MG tablet Commonly known as:  NORCO Take 0.5-1 tablets by mouth every 6 (six) hours as needed for moderate pain.   linaclotide 145 MCG Caps capsule Commonly known as:  LINZESS Take 2 capsules (290 mcg total) by mouth daily before breakfast.   meclizine 25 MG tablet Commonly known as:  ANTIVERT TAKE 1 TABLET (25 MG TOTAL) BY MOUTH 3 (THREE) TIMES DAILY AS NEEDED FOR DIZZINESS.   warfarin 3 MG tablet Commonly known  as:  COUMADIN Take as directed. If you are unsure how to take this medication, talk to your nurse or doctor. Original instructions:  Take as directed 1-1.5 tablets a day What changed:    how much to take  how to take this  when to take this  additional instructions         Signed: Vercie Pokorny,STEVEN R 08/25/2017, 3:22 PM  [facility] is now [facility] [address] [place], [place], [place] [address] Phone: [place]

## 2017-08-25 NOTE — Clinical Social Work Placement (Signed)
   CLINICAL SOCIAL WORK PLACEMENT  NOTE  Date:  08/25/2017  Patient Details  Name: Virginia Young MRN: [id number] Date of Birth: [birth year]  Clinical Social Work is seeking post-discharge placement for this patient at the [facility] level of care (*CSW will initial, date and re-position this form in  chart as items are completed):  Yes   Patient/family provided with [facility] Work Department's list of facilities offering this level of care within the geographic area requested by the patient (or if unable, by the patient's family).  Yes   Patient/family informed of their freedom to choose among providers that offer the needed level of care, that participate in Medicare, Medicaid or managed care program needed by the patient, have an available bed and are willing to accept the patient.  Yes   Patient/family informed of [facility]'s ownership interest in [facility] and [facility], as well as of the fact that they are under no obligation to receive care at these facilities.  PASRR submitted to EDS on       PASRR number received on       Existing PASRR number confirmed on 08/25/17     FL2 transmitted to all facilities in geographic area requested by pt/family on       FL2 transmitted to all facilities within larger geographic area on 08/25/17     Patient informed that his/her managed care company has contracts with or will negotiate with certain facilities, including the following:            Patient/family informed of bed offers received.  Patient chooses bed at [facility]     Physician recommends and patient chooses bed at      Patient to be transferred to [facility] on 08/25/17.  Patient to be transferred to facility by [facility]     Patient family notified on 08/25/17 of transfer.  Name of family member notified:  grandson contacted, Leone Haven     PHYSICIAN       Additional Comment:     _______________________________________________ Normajean Baxter, LCSW 08/25/2017, 3:29 PM

## 2017-08-25 NOTE — Clinical Social Work Note (Signed)
Clinical Social Work Assessment  Patient Details  Name: Virginia Young MRN: [id number] Date of Birth: Nov 18, 1940  Date of referral:  08/25/17               Reason for consult:  Facility Placement                Permission sought to share information with:    Permission granted to share information::     Name::     Leone Haven  Agency::  SNF  Relationship::  grandson  Contact Information:     Housing/Transportation Living arrangements for the past 2 months:  Single Family Home Source of Information:  Patient, Other (Comment Required)(Grandson) Patient Interpreter Needed:  None Criminal Activity/Legal Involvement Pertinent to Current Situation/Hospitalization:  No - Comment as needed Significant Relationships:  Adult Children, Other Family Members Lives with:  Self Do you feel safe going back to the place where you live?  No Need for family participation in patient care:  Yes (Comment)  Care giving concerns:  Pt resides alone at home and was independent prior to new impairment. Pt with new impairment and agreeble to SNF. CSW met with patient and grandson at bedside. Both agreeable to SNF and desires SNF near [place] or [place]. CSW obtained auth to begin the SNF process. CSW explained SNF options/placement and CSW role in process.   Social Worker assessment / plan:  CSW will f/u for disposition.  Employment status:  Retired Forensic scientist:  Medicare PT Recommendations:  [place] / Referral to community resources:  [place]  Patient/Family's Response to care:  P/grandsont appreciative of CSW assistance with discussing SNF placement and options. Pt agreeable to SNF near home.   Patient/Family's Understanding of and Emotional Response to Diagnosis, Current Treatment, and Prognosis:  Patient/grandson agreeable to SNF. Pt has experience with SNF and understands that she will need rehab prior to returning home. Pt has supportive  family and desires to return home after rehab. CSW discussed briefly with grandson the long term plan for patient. CSW will f/u for disposition. No issues or concerns identified.  Emotional Assessment Appearance:  Appears stated age Attitude/Demeanor/Rapport:  (Cooperative) Affect (typically observed):  Accepting, Appropriate Orientation:  Oriented to Situation, Oriented to  Time, Oriented to Place, Oriented to Self Alcohol / Substance use:  Not Applicable Psych involvement (Current and /or in the community):  No (Comment)  Discharge Needs  Concerns to be addressed:  Discharge Planning Concerns Readmission within the last 30 days:  No Current discharge risk:  Dependent with Mobility, Lives alone, Physical Impairment Barriers to Discharge:  No Barriers Identified   Normajean Baxter, LCSW 08/25/2017, 12:11 PM

## 2017-08-25 NOTE — Op Note (Signed)
NAME:  Virginia Young, Virginia Young NO.:  [id number]  MEDICAL RECORD NO.:  [id number]  LOCATION:                                 FACILITY:  PHYSICIAN:  [place] Veverly Fells, M.D. DATE OF BIRTH:  November 25, 1940  DATE OF PROCEDURE:  08/22/2017 DATE OF DISCHARGE:                              OPERATIVE REPORT   PREOPERATIVE DIAGNOSES: 1. Right carpal tunnel syndrome. 2. Right shoulder rotator cuff tear arthropathy.  POSTOPERATIVE DIAGNOSES: 1. Right carpal tunnel syndrome. 2. Right shoulder rotator cuff tear arthropathy.  PROCEDURE PERFORMED: 1. Right carpal tunnel release. 2. Done under separate prep and drape is right reverse total shoulder     arthroplasty with subscapularis repair.  ATTENDING SURGEON:  Doran Heater. Veverly Fells, MD.  ASSISTANT:  Ventura Bruns, P.A., who was scrubbed during the entire procedure and necessary for satisfactory completion of surgery.  ANESTHESIA:  General anesthesia was used plus interscalene block.  ESTIMATED BLOOD LOSS:  Less than 100 mL.  FLUID REPLACEMENT:  1200 mL crystalloid.  INSTRUMENT COUNTS:  Correct.  COMPLICATIONS:  There were no complications.  Perioperative antibiotics were given.  INDICATIONS:  The patient is a 77 year old female, who presents with a history of worsening carpal tunnel syndrome as well as right shoulder pain and dysfunction secondary to rotator cuff tear arthropathy.  The patient has had an extended period of conservative management, desires operative treatment with the reverse shoulder replacement to restore function, eliminate pain to her shoulder, and a carpal tunnel release to relieve pressure on her median nerve.  Risks and benefits of both surgeries discussed in detail with the patient and family.  Informed consent obtained.  DESCRIPTION OF PROCEDURE:  After an adequate level of anesthesia was achieved, the patient was positioned in the modified beach-chair position.  We actually started with her in the  supine position on the shoulder holder and used an armboard.  We sterilely prepped and draped the arm over a nonsterile tourniquet that was placed on the proximal arm.  Time-out was called for the hand, verifying correct patient, correct site.  We did initial carpal tunnel block with 0.25% Marcaine without epinephrine into the carpal canal and also into the palmar incision area.  After exsanguination of the limb and elevation of the tourniquet to 250 mmHg, we did a palmar incision using loupe magnification.  Dissection down through the subcutaneous tissues. Superficial palmar fascia divided, allowing visualization of transverse carpal ligament.  We performed a complete release of the transverse carpal ligament from the mid palmar space into the superficial forearm fascia, visualizing the nerve which was under significant compression. We protected the nerve and it was in continuity after the release.  We irrigated thoroughly and closed with interrupted nylon closure with 3-0 nylon.  Sterile compressive bandage was applied.  At this point, we then sat the patient up and repositioned, placing the patient's head in a head holder and getting her in a good position for the shoulder surgery. This was a modified beach-chair position.  We then prepped and draped the right shoulder and arm in the usual manner, verified correct patient, correct site with a second time-out.  We then initiated surgery using a deltopectoral incision, starting at  the coracoid process extending down to the anterior humerus.  Dissection down through subcutaneous tissues using Bovie electrocautery.  We identified the cephalic vein, took it laterally with the deltoid, pectoralis taken medially.  Conjoint tendon identified, retracted medially.  We went ahead and tenodesed the biceps in situ with a 0 Vicryl figure-of-eight suture x2 incorporating the pec tendon.  We then released the subscapularis subperiosteally off the  lesser tuberosity and then tagged with #2 FiberWire suture in a modified Mason-Allen suture technique for repair at the end.  Next, we extended the shoulder and released the biceps tendon and the supraspinatus and removed that.  We kept the posterior infraspinatus and teres minor.  We then entered the proximal humerus with a 6 mm reamer, reamed up to size 12.  Once we had that 12 size identified, then we placed our 12 intramedullary head resection guide in and set that on 10 degrees of retroversion and resected the head with an oscillating saw.  We removed excess osteophytes from the humeral side with a rongeur.  We then were able to sublux the humerus posteriorly.  We did a 360-degree capsule labral excision, getting down to the glenoid, removed the remaining cartilage with a Cobb elevator, found our center point, which was centered low on the glenoid and used the drill guide to drill our central guide pin.  We then reamed over that drill guide, did our peripheral hand reaming and drilled our central peg hole and impacted the base plate for the DePuy Delta Xtend Reverse Shoulder System.  Once that base plate was in position, referencing off the inferior scapular neck, we drilled our holes for our peripheral screws, which were locking screws, placed a 48 locking inferiorly, a 36 superiorly at the base of the coracoid, and then an 18 nonlocked posteriorly.  We had good secure fixation of the base plate. We then went ahead and selected our 38 standard glenosphere and placed that over the base plate and secured that in with screwing that down in position and impacting and screwing again.  We had nice secure fixation. I did a finger sweep to make sure no soft tissue was incarcerated into the base plate, and at this point, we redirected our attention back toward the humeral side.  We completed our humeral preparation reaming for the epi-2 right metaphyseal component.  We did our trialing with  the 12 stem and the epi-2 right set on the 0 setting and placed in 10 degrees of retroversion, that gave good proximal coverage.  We reduced with a 38 +3 poly and had a nice stable shoulder.  We retrieved all the trial components, irrigated thoroughly, placed a couple drill holes in the lesser tuberosity and placed #2 FiberWire suture through this drill holes for the repair of the subscapularis.  We then selected the HA coated press-fit stem size 12 with the epi-2 right metaphyseal component HA coated and placed on the 0 setting and in 10 degrees of retroversion, we used available bone graft from the humeral head and did impaction grafting technique.  We had great security to the stem, which did not move at all.  Next, we went ahead and selected the real poly, which was a 38 +3 poly and impacted that position, reduced the shoulder with nice little pop and had good security, so conjoint nice and tight, axillary nerve not under too much tension, no gapping with inferior pole or external rotation.  We irrigated thoroughly and then repaired the subscapularis anatomically back  to lesser tuberosity and then deltopectoral closure with 0 Vicryl suture, followed by 2-0 Vicryl for subcutaneous closure, and 4-0 Monocryl for skin.  The patient had a sterile dressing placed and was transferred to recovery room in a shoulder sling, having tolerated surgery well.     Doran Heater. Veverly Fells, M.D.     SRN/MEDQ  D:  08/22/2017  T:  08/22/2017  Job:  [id number]

## 2017-08-26 DIAGNOSIS — I340 Nonrheumatic mitral (valve) insufficiency: Secondary | ICD-10-CM | POA: Diagnosis not present

## 2017-08-26 DIAGNOSIS — Z4889 Encounter for other specified surgical aftercare: Secondary | ICD-10-CM | POA: Diagnosis not present

## 2017-08-26 DIAGNOSIS — M6281 Muscle weakness (generalized): Secondary | ICD-10-CM | POA: Diagnosis not present

## 2017-08-26 DIAGNOSIS — I499 Cardiac arrhythmia, unspecified: Secondary | ICD-10-CM | POA: Diagnosis not present

## 2017-08-29 DIAGNOSIS — M6281 Muscle weakness (generalized): Secondary | ICD-10-CM | POA: Diagnosis not present

## 2017-08-29 DIAGNOSIS — I340 Nonrheumatic mitral (valve) insufficiency: Secondary | ICD-10-CM | POA: Diagnosis not present

## 2017-08-29 DIAGNOSIS — I499 Cardiac arrhythmia, unspecified: Secondary | ICD-10-CM | POA: Diagnosis not present

## 2017-09-04 ENCOUNTER — Telehealth: Payer: Self-pay

## 2017-09-04 DIAGNOSIS — Z471 Aftercare following joint replacement surgery: Secondary | ICD-10-CM | POA: Diagnosis not present

## 2017-09-04 DIAGNOSIS — K5900 Constipation, unspecified: Secondary | ICD-10-CM

## 2017-09-04 DIAGNOSIS — Z96611 Presence of right artificial shoulder joint: Secondary | ICD-10-CM | POA: Diagnosis not present

## 2017-09-04 MED ORDER — LINACLOTIDE 290 MCG PO CAPS: 290.0000 ug | ORAL_CAPSULE | Freq: Every day | ORAL | 1 refills | Status: DC

## 2017-09-04 NOTE — Telephone Encounter (Signed)
INsurance does not want to cover two Linzess 145 mg daily   Can you write for Linzess 290 mg daily???

## 2017-09-04 NOTE — Telephone Encounter (Signed)
Sent in

## 2017-09-18 DIAGNOSIS — Z4889 Encounter for other specified surgical aftercare: Secondary | ICD-10-CM | POA: Diagnosis not present

## 2017-09-18 DIAGNOSIS — M6281 Muscle weakness (generalized): Secondary | ICD-10-CM | POA: Diagnosis not present

## 2017-09-18 DIAGNOSIS — I340 Nonrheumatic mitral (valve) insufficiency: Secondary | ICD-10-CM | POA: Diagnosis not present

## 2017-09-18 DIAGNOSIS — I499 Cardiac arrhythmia, unspecified: Secondary | ICD-10-CM | POA: Diagnosis not present

## 2017-09-19 ENCOUNTER — Other Ambulatory Visit: Payer: Self-pay | Admitting: Pediatrics

## 2017-09-19 DIAGNOSIS — K5900 Constipation, unspecified: Secondary | ICD-10-CM

## 2017-09-23 DIAGNOSIS — Z4889 Encounter for other specified surgical aftercare: Secondary | ICD-10-CM | POA: Diagnosis not present

## 2017-09-23 DIAGNOSIS — M6281 Muscle weakness (generalized): Secondary | ICD-10-CM | POA: Diagnosis not present

## 2017-09-23 DIAGNOSIS — I5022 Chronic systolic (congestive) heart failure: Secondary | ICD-10-CM | POA: Diagnosis not present

## 2017-09-23 DIAGNOSIS — I499 Cardiac arrhythmia, unspecified: Secondary | ICD-10-CM | POA: Diagnosis not present

## 2017-09-25 DIAGNOSIS — Z96611 Presence of right artificial shoulder joint: Secondary | ICD-10-CM | POA: Diagnosis not present

## 2017-09-25 DIAGNOSIS — Z471 Aftercare following joint replacement surgery: Secondary | ICD-10-CM | POA: Diagnosis not present

## 2017-09-25 DIAGNOSIS — G35 Multiple sclerosis: Secondary | ICD-10-CM | POA: Diagnosis not present

## 2017-09-25 DIAGNOSIS — I110 Hypertensive heart disease with heart failure: Secondary | ICD-10-CM | POA: Diagnosis not present

## 2017-09-25 DIAGNOSIS — I5022 Chronic systolic (congestive) heart failure: Secondary | ICD-10-CM | POA: Diagnosis not present

## 2017-09-25 DIAGNOSIS — I482 Chronic atrial fibrillation: Secondary | ICD-10-CM | POA: Diagnosis not present

## 2017-09-26 DIAGNOSIS — I5022 Chronic systolic (congestive) heart failure: Secondary | ICD-10-CM | POA: Diagnosis not present

## 2017-09-26 DIAGNOSIS — Z471 Aftercare following joint replacement surgery: Secondary | ICD-10-CM | POA: Diagnosis not present

## 2017-09-26 DIAGNOSIS — Z96611 Presence of right artificial shoulder joint: Secondary | ICD-10-CM | POA: Diagnosis not present

## 2017-09-26 DIAGNOSIS — I110 Hypertensive heart disease with heart failure: Secondary | ICD-10-CM | POA: Diagnosis not present

## 2017-09-26 DIAGNOSIS — I482 Chronic atrial fibrillation: Secondary | ICD-10-CM | POA: Diagnosis not present

## 2017-09-26 DIAGNOSIS — G35 Multiple sclerosis: Secondary | ICD-10-CM | POA: Diagnosis not present

## 2017-09-30 ENCOUNTER — Ambulatory Visit: Payer: Medicare Other | Admitting: Pediatrics

## 2017-09-30 DIAGNOSIS — I110 Hypertensive heart disease with heart failure: Secondary | ICD-10-CM | POA: Diagnosis not present

## 2017-09-30 DIAGNOSIS — G35 Multiple sclerosis: Secondary | ICD-10-CM | POA: Diagnosis not present

## 2017-09-30 DIAGNOSIS — Z96611 Presence of right artificial shoulder joint: Secondary | ICD-10-CM | POA: Diagnosis not present

## 2017-09-30 DIAGNOSIS — I5022 Chronic systolic (congestive) heart failure: Secondary | ICD-10-CM | POA: Diagnosis not present

## 2017-09-30 DIAGNOSIS — Z471 Aftercare following joint replacement surgery: Secondary | ICD-10-CM | POA: Diagnosis not present

## 2017-09-30 DIAGNOSIS — I482 Chronic atrial fibrillation: Secondary | ICD-10-CM | POA: Diagnosis not present

## 2017-10-01 DIAGNOSIS — I482 Chronic atrial fibrillation: Secondary | ICD-10-CM | POA: Diagnosis not present

## 2017-10-01 DIAGNOSIS — I5022 Chronic systolic (congestive) heart failure: Secondary | ICD-10-CM | POA: Diagnosis not present

## 2017-10-01 DIAGNOSIS — G35 Multiple sclerosis: Secondary | ICD-10-CM | POA: Diagnosis not present

## 2017-10-01 DIAGNOSIS — Z96611 Presence of right artificial shoulder joint: Secondary | ICD-10-CM | POA: Diagnosis not present

## 2017-10-01 DIAGNOSIS — Z471 Aftercare following joint replacement surgery: Secondary | ICD-10-CM | POA: Diagnosis not present

## 2017-10-01 DIAGNOSIS — I110 Hypertensive heart disease with heart failure: Secondary | ICD-10-CM | POA: Diagnosis not present

## 2017-10-02 DIAGNOSIS — I482 Chronic atrial fibrillation: Secondary | ICD-10-CM | POA: Diagnosis not present

## 2017-10-02 DIAGNOSIS — Z96611 Presence of right artificial shoulder joint: Secondary | ICD-10-CM | POA: Diagnosis not present

## 2017-10-02 DIAGNOSIS — I5022 Chronic systolic (congestive) heart failure: Secondary | ICD-10-CM | POA: Diagnosis not present

## 2017-10-02 DIAGNOSIS — G35 Multiple sclerosis: Secondary | ICD-10-CM | POA: Diagnosis not present

## 2017-10-02 DIAGNOSIS — I110 Hypertensive heart disease with heart failure: Secondary | ICD-10-CM | POA: Diagnosis not present

## 2017-10-02 DIAGNOSIS — Z471 Aftercare following joint replacement surgery: Secondary | ICD-10-CM | POA: Diagnosis not present

## 2017-10-03 DIAGNOSIS — Z96611 Presence of right artificial shoulder joint: Secondary | ICD-10-CM | POA: Diagnosis not present

## 2017-10-03 DIAGNOSIS — G35 Multiple sclerosis: Secondary | ICD-10-CM | POA: Diagnosis not present

## 2017-10-03 DIAGNOSIS — I482 Chronic atrial fibrillation: Secondary | ICD-10-CM | POA: Diagnosis not present

## 2017-10-03 DIAGNOSIS — I110 Hypertensive heart disease with heart failure: Secondary | ICD-10-CM | POA: Diagnosis not present

## 2017-10-03 DIAGNOSIS — Z471 Aftercare following joint replacement surgery: Secondary | ICD-10-CM | POA: Diagnosis not present

## 2017-10-03 DIAGNOSIS — I5022 Chronic systolic (congestive) heart failure: Secondary | ICD-10-CM | POA: Diagnosis not present

## 2017-10-06 DIAGNOSIS — I5022 Chronic systolic (congestive) heart failure: Secondary | ICD-10-CM | POA: Diagnosis not present

## 2017-10-06 DIAGNOSIS — G35 Multiple sclerosis: Secondary | ICD-10-CM | POA: Diagnosis not present

## 2017-10-06 DIAGNOSIS — I482 Chronic atrial fibrillation: Secondary | ICD-10-CM | POA: Diagnosis not present

## 2017-10-06 DIAGNOSIS — Z96611 Presence of right artificial shoulder joint: Secondary | ICD-10-CM | POA: Diagnosis not present

## 2017-10-06 DIAGNOSIS — I110 Hypertensive heart disease with heart failure: Secondary | ICD-10-CM | POA: Diagnosis not present

## 2017-10-06 DIAGNOSIS — Z471 Aftercare following joint replacement surgery: Secondary | ICD-10-CM | POA: Diagnosis not present

## 2017-10-07 DIAGNOSIS — G35 Multiple sclerosis: Secondary | ICD-10-CM | POA: Diagnosis not present

## 2017-10-07 DIAGNOSIS — Z96611 Presence of right artificial shoulder joint: Secondary | ICD-10-CM | POA: Diagnosis not present

## 2017-10-07 DIAGNOSIS — I5022 Chronic systolic (congestive) heart failure: Secondary | ICD-10-CM | POA: Diagnosis not present

## 2017-10-07 DIAGNOSIS — Z471 Aftercare following joint replacement surgery: Secondary | ICD-10-CM | POA: Diagnosis not present

## 2017-10-07 DIAGNOSIS — I482 Chronic atrial fibrillation: Secondary | ICD-10-CM | POA: Diagnosis not present

## 2017-10-07 DIAGNOSIS — I110 Hypertensive heart disease with heart failure: Secondary | ICD-10-CM | POA: Diagnosis not present

## 2017-10-08 DIAGNOSIS — Z96611 Presence of right artificial shoulder joint: Secondary | ICD-10-CM | POA: Diagnosis not present

## 2017-10-08 DIAGNOSIS — I482 Chronic atrial fibrillation: Secondary | ICD-10-CM | POA: Diagnosis not present

## 2017-10-08 DIAGNOSIS — I110 Hypertensive heart disease with heart failure: Secondary | ICD-10-CM | POA: Diagnosis not present

## 2017-10-08 DIAGNOSIS — I5022 Chronic systolic (congestive) heart failure: Secondary | ICD-10-CM | POA: Diagnosis not present

## 2017-10-08 DIAGNOSIS — Z471 Aftercare following joint replacement surgery: Secondary | ICD-10-CM | POA: Diagnosis not present

## 2017-10-08 DIAGNOSIS — G35 Multiple sclerosis: Secondary | ICD-10-CM | POA: Diagnosis not present

## 2017-10-09 ENCOUNTER — Ambulatory Visit (INDEPENDENT_AMBULATORY_CARE_PROVIDER_SITE_OTHER): Payer: Medicare Other | Admitting: Pediatrics

## 2017-10-09 ENCOUNTER — Encounter: Payer: Self-pay | Admitting: Pediatrics

## 2017-10-09 VITALS — BP 113/76 | HR 90 | Temp 97.6°F | Ht 70.0 in | Wt 177.2 lb

## 2017-10-09 DIAGNOSIS — Z7901 Long term (current) use of anticoagulants: Secondary | ICD-10-CM

## 2017-10-09 DIAGNOSIS — E876 Hypokalemia: Secondary | ICD-10-CM

## 2017-10-09 DIAGNOSIS — R609 Edema, unspecified: Secondary | ICD-10-CM

## 2017-10-09 DIAGNOSIS — I482 Chronic atrial fibrillation: Secondary | ICD-10-CM | POA: Diagnosis not present

## 2017-10-09 DIAGNOSIS — E559 Vitamin D deficiency, unspecified: Secondary | ICD-10-CM | POA: Diagnosis not present

## 2017-10-09 DIAGNOSIS — R739 Hyperglycemia, unspecified: Secondary | ICD-10-CM | POA: Diagnosis not present

## 2017-10-09 DIAGNOSIS — I480 Paroxysmal atrial fibrillation: Secondary | ICD-10-CM | POA: Diagnosis not present

## 2017-10-09 DIAGNOSIS — R42 Dizziness and giddiness: Secondary | ICD-10-CM

## 2017-10-09 LAB — BAYER DCA HB A1C WAIVED: HB A1C (BAYER DCA - WAIVED): 5.6 % (ref <7.0)

## 2017-10-09 LAB — BASIC METABOLIC PANEL
BUN/Creatinine Ratio: 19 (ref 12–28)
BUN: 19 mg/dL (ref 8–27)
CO2: 26 mmol/L (ref 20–29)
Calcium: 8.8 mg/dL (ref 8.7–10.3)
Chloride: 92 mmol/L — ABNORMAL LOW (ref 96–106)
Creatinine, Ser: 0.98 mg/dL (ref 0.57–1.00)
GFR calc Af Amer: 64 mL/min/{1.73_m2} (ref >59)
GFR calc non Af Amer: 56 mL/min/{1.73_m2} — ABNORMAL LOW (ref >59)
Glucose: 133 mg/dL — ABNORMAL HIGH (ref 65–99)
Potassium: 4 mmol/L (ref 3.5–5.2)
Sodium: 130 mmol/L — ABNORMAL LOW (ref 134–144)

## 2017-10-09 LAB — COAGUCHEK XS/INR WAIVED
INR: 1.1 (ref 0.9–1.1)
Prothrombin Time: 13.7 s

## 2017-10-09 MED ORDER — CVS D3 25 MCG (1000 UT) PO CAPS
1000.0000 [IU] | ORAL_CAPSULE | Freq: Every day | ORAL | 1 refills | Status: DC
Start: 2017-10-09 — End: 2018-05-17

## 2017-10-09 MED ORDER — KLOR-CON 10 10 MEQ PO TBCR: 10.0000 meq | EXTENDED_RELEASE_TABLET | ORAL | 2 refills | Status: DC

## 2017-10-09 MED ORDER — HYDROCHLOROTHIAZIDE 12.5 MG PO TABS: 12.5000 mg | ORAL_TABLET | Freq: Every day | ORAL | 3 refills | Status: DC

## 2017-10-09 MED ORDER — ELIQUIS 5 MG PO TABS: 5.0000 mg | ORAL_TABLET | Freq: Two times a day (BID) | ORAL | 1 refills | Status: DC

## 2017-10-09 NOTE — Progress Notes (Signed)
  Subjective:   Patient ID: Virginia Young, female    DOB: 09/11/1940, 77 y.o.   MRN: [id number] CC: Follow-up Multiple medical problems HPI: Virginia Young is a 77 y.o. female presenting for Follow-up Here today with her grandson A. fib: Now on Eliquis.  Tolerating well.  Right shoulder pain.:  Recent shoulder surgery.  Still somewhat sore from the surgery but overall she thinks the pain is going to be improved after rehab.  Hypertension: Taking hydrochlorothiazide regularly.  Also on potassium.  Lower extremity swelling: Worse when she is on her feet throughout the day, improved in the morning.  Relevant past medical, surgical, family and social history reviewed. Allergies and medications reviewed and updated. Social History   Tobacco Use  Smoking Status Never Smoker  Smokeless Tobacco Never Used   ROS: Per HPI   Objective:    BP 113/76   Pulse 90   Temp 97.6 F (36.4 C) (Oral)   Ht 5\' 10"  (1.778 m)   Wt 177 lb 3.2 oz (80.4 kg)   BMI 25.43 kg/m   Wt Readings from Last 3 Encounters:  10/09/17 177 lb 3.2 oz (80.4 kg)  08/22/17 179 lb (81.2 kg)  08/18/17 179 lb (81.2 kg)    Gen: NAD, alert, cooperative with exam, NCAT EYES: EOMI, no conjunctival injection, or no icterus ENT:OP without erythema LYMPH: no cervical LAD CV: Normal rate, irregular rhythm, normal S1/S2 Resp: CTABL, no wheezes, normal WOB Abd: +BS, soft, mildly tender with deep palpation. ND. no guarding Ext: 1+ pitting edema bilateral ankles.  Varicose veins present bilateral lower legs. Neuro: Alert and oriented Skin: Surgical incision right anterior shoulder well-healing, no redness or discharge  Assessment & Plan:  Emmely was seen today for follow-up multiple medical problems  Diagnoses and all orders for this visit:  Paroxysmal atrial fibrillation ([place]) Now on Eliquis.  Tolerating well.  Has been affordable. -     ELIQUIS 5 MG TABS tablet; Take 1 tablet (5 mg total) by mouth every 12  (twelve) hours.  Swelling -     hydrochlorothiazide (HYDRODIURIL) 12.5 MG tablet; Take 1 tablet (12.5 mg total) by mouth daily.  Vertigo Rarely needs meclizine when she is feeling dizzy.  It does help when she has that feeling.  Vitamin D deficiency -     [employer] D3 1000 units capsule; Take 1 capsule (1,000 Units total) by mouth daily.  Hypokalemia -     KLOR-CON 10 10 MEQ tablet; Take 1 tablet (10 mEq total) by mouth every other day. -     Basic Metabolic Panel  Hyperglycemia -     Bayer DCA Hb A1c Waived   Follow up plan: Return in about 3 months (around 01/08/2018). Assunta Found, MD [place]

## 2017-10-10 DIAGNOSIS — I482 Chronic atrial fibrillation: Secondary | ICD-10-CM | POA: Diagnosis not present

## 2017-10-10 DIAGNOSIS — I110 Hypertensive heart disease with heart failure: Secondary | ICD-10-CM | POA: Diagnosis not present

## 2017-10-10 DIAGNOSIS — G35 Multiple sclerosis: Secondary | ICD-10-CM | POA: Diagnosis not present

## 2017-10-10 DIAGNOSIS — Z96611 Presence of right artificial shoulder joint: Secondary | ICD-10-CM | POA: Diagnosis not present

## 2017-10-10 DIAGNOSIS — Z471 Aftercare following joint replacement surgery: Secondary | ICD-10-CM | POA: Diagnosis not present

## 2017-10-10 DIAGNOSIS — I5022 Chronic systolic (congestive) heart failure: Secondary | ICD-10-CM | POA: Diagnosis not present

## 2017-10-13 DIAGNOSIS — G35 Multiple sclerosis: Secondary | ICD-10-CM | POA: Diagnosis not present

## 2017-10-13 DIAGNOSIS — I110 Hypertensive heart disease with heart failure: Secondary | ICD-10-CM | POA: Diagnosis not present

## 2017-10-13 DIAGNOSIS — I482 Chronic atrial fibrillation: Secondary | ICD-10-CM | POA: Diagnosis not present

## 2017-10-13 DIAGNOSIS — Z471 Aftercare following joint replacement surgery: Secondary | ICD-10-CM | POA: Diagnosis not present

## 2017-10-13 DIAGNOSIS — I5022 Chronic systolic (congestive) heart failure: Secondary | ICD-10-CM | POA: Diagnosis not present

## 2017-10-13 DIAGNOSIS — Z96611 Presence of right artificial shoulder joint: Secondary | ICD-10-CM | POA: Diagnosis not present

## 2017-10-14 DIAGNOSIS — Z96611 Presence of right artificial shoulder joint: Secondary | ICD-10-CM | POA: Diagnosis not present

## 2017-10-14 DIAGNOSIS — I5022 Chronic systolic (congestive) heart failure: Secondary | ICD-10-CM | POA: Diagnosis not present

## 2017-10-14 DIAGNOSIS — I482 Chronic atrial fibrillation: Secondary | ICD-10-CM | POA: Diagnosis not present

## 2017-10-14 DIAGNOSIS — I110 Hypertensive heart disease with heart failure: Secondary | ICD-10-CM | POA: Diagnosis not present

## 2017-10-14 DIAGNOSIS — G35 Multiple sclerosis: Secondary | ICD-10-CM | POA: Diagnosis not present

## 2017-10-14 DIAGNOSIS — Z471 Aftercare following joint replacement surgery: Secondary | ICD-10-CM | POA: Diagnosis not present

## 2017-10-15 ENCOUNTER — Ambulatory Visit (INDEPENDENT_AMBULATORY_CARE_PROVIDER_SITE_OTHER): Payer: Medicare Other

## 2017-10-15 DIAGNOSIS — R2689 Other abnormalities of gait and mobility: Secondary | ICD-10-CM

## 2017-10-15 DIAGNOSIS — F329 Major depressive disorder, single episode, unspecified: Secondary | ICD-10-CM | POA: Diagnosis not present

## 2017-10-15 DIAGNOSIS — Z471 Aftercare following joint replacement surgery: Secondary | ICD-10-CM

## 2017-10-15 DIAGNOSIS — M6281 Muscle weakness (generalized): Secondary | ICD-10-CM | POA: Diagnosis not present

## 2017-10-15 DIAGNOSIS — I482 Chronic atrial fibrillation: Secondary | ICD-10-CM

## 2017-10-15 DIAGNOSIS — Z96611 Presence of right artificial shoulder joint: Secondary | ICD-10-CM

## 2017-10-15 DIAGNOSIS — I5022 Chronic systolic (congestive) heart failure: Secondary | ICD-10-CM

## 2017-10-15 DIAGNOSIS — G35 Multiple sclerosis: Secondary | ICD-10-CM | POA: Diagnosis not present

## 2017-10-15 DIAGNOSIS — I110 Hypertensive heart disease with heart failure: Secondary | ICD-10-CM | POA: Diagnosis not present

## 2017-10-17 DIAGNOSIS — I5022 Chronic systolic (congestive) heart failure: Secondary | ICD-10-CM | POA: Diagnosis not present

## 2017-10-17 DIAGNOSIS — I482 Chronic atrial fibrillation: Secondary | ICD-10-CM | POA: Diagnosis not present

## 2017-10-17 DIAGNOSIS — Z471 Aftercare following joint replacement surgery: Secondary | ICD-10-CM | POA: Diagnosis not present

## 2017-10-17 DIAGNOSIS — Z96611 Presence of right artificial shoulder joint: Secondary | ICD-10-CM | POA: Diagnosis not present

## 2017-10-17 DIAGNOSIS — G35 Multiple sclerosis: Secondary | ICD-10-CM | POA: Diagnosis not present

## 2017-10-17 DIAGNOSIS — I110 Hypertensive heart disease with heart failure: Secondary | ICD-10-CM | POA: Diagnosis not present

## 2017-10-20 ENCOUNTER — Other Ambulatory Visit: Payer: Self-pay | Admitting: Pediatrics

## 2017-10-20 DIAGNOSIS — Z471 Aftercare following joint replacement surgery: Secondary | ICD-10-CM | POA: Diagnosis not present

## 2017-10-20 DIAGNOSIS — Z96611 Presence of right artificial shoulder joint: Secondary | ICD-10-CM | POA: Diagnosis not present

## 2017-10-20 DIAGNOSIS — I110 Hypertensive heart disease with heart failure: Secondary | ICD-10-CM | POA: Diagnosis not present

## 2017-10-20 DIAGNOSIS — G35 Multiple sclerosis: Secondary | ICD-10-CM | POA: Diagnosis not present

## 2017-10-20 DIAGNOSIS — I482 Chronic atrial fibrillation: Secondary | ICD-10-CM | POA: Diagnosis not present

## 2017-10-20 DIAGNOSIS — Z5181 Encounter for therapeutic drug level monitoring: Secondary | ICD-10-CM

## 2017-10-20 DIAGNOSIS — I480 Paroxysmal atrial fibrillation: Secondary | ICD-10-CM

## 2017-10-20 DIAGNOSIS — I5022 Chronic systolic (congestive) heart failure: Secondary | ICD-10-CM | POA: Diagnosis not present

## 2017-10-22 ENCOUNTER — Encounter: Payer: Self-pay | Admitting: Pediatrics

## 2017-10-22 ENCOUNTER — Ambulatory Visit (INDEPENDENT_AMBULATORY_CARE_PROVIDER_SITE_OTHER): Payer: Medicare Other | Admitting: Pediatrics

## 2017-10-22 VITALS — BP 122/83 | HR 77 | Temp 97.5°F | Ht 70.0 in | Wt 177.2 lb

## 2017-10-22 DIAGNOSIS — Z8679 Personal history of other diseases of the circulatory system: Secondary | ICD-10-CM | POA: Diagnosis not present

## 2017-10-22 NOTE — Progress Notes (Signed)
  Subjective:   Patient ID: Virginia Young, female    DOB: 09/16/1940, 77 y.o.   MRN: [id number] CC: Follow-up (2 week, BP)  HPI: Virginia Young is a 77 y.o. female   Here today with grandson.  Blood pressures been checked for the last 2 weeks with physical therapy, they did not bring the numbers in but the physical therapist said that all of them have looked good per grandson.  Not taking hydrochlorthiazide now.  Takes Eliquis twice a day, thinks it sometimes makes her feel weak in the knees.  No falls.  Feels like she is still getting her strength back after recent shoulder replacement surgery.  Relevant past medical, surgical, family and social history reviewed. Allergies and medications reviewed and updated. Social History   Tobacco Use  Smoking Status Never Smoker  Smokeless Tobacco Never Used   ROS: Per HPI   Objective:    BP 122/83   Pulse 77   Temp (!) 97.5 F (36.4 C) (Oral)   Ht 5\' 10"  (1.778 m)   Wt 177 lb 3.2 oz (80.4 kg)   BMI 25.43 kg/m   Wt Readings from Last 3 Encounters:  10/22/17 177 lb 3.2 oz (80.4 kg)  10/09/17 177 lb 3.2 oz (80.4 kg)  08/22/17 179 lb (81.2 kg)    Gen: NAD, alert, cooperative with exam, NCAT EYES: EOMI, no conjunctival injection, or no icterus CV: NRRR, normal S1/S2, no murmur, distal pulses 2+ b/l Resp: CTABL, no wheezes, normal WOB Abd: +BS, soft, NTND Ext: No edema, warm Neuro: Alert and oriented  Assessment & Plan:  Virginia Young was seen today for follow-up blood pressure.  Diagnoses and all orders for this visit:  History of blood pressure problems Continue to check blood pressure at home when able.  Not on any blood pressure medicines now with good control blood pressure.  Take Eliquis with food twice a day.  Continue physical therapy.  Any worsening symptoms let me know.  Okay to stop potassium as she is not on a diuretic.  Follow up plan: Return in about 3 months (around 01/22/2018). Assunta Found, MD [place]

## 2017-10-23 DIAGNOSIS — I5022 Chronic systolic (congestive) heart failure: Secondary | ICD-10-CM | POA: Diagnosis not present

## 2017-10-23 DIAGNOSIS — I110 Hypertensive heart disease with heart failure: Secondary | ICD-10-CM | POA: Diagnosis not present

## 2017-10-23 DIAGNOSIS — I482 Chronic atrial fibrillation: Secondary | ICD-10-CM | POA: Diagnosis not present

## 2017-10-23 DIAGNOSIS — G35 Multiple sclerosis: Secondary | ICD-10-CM | POA: Diagnosis not present

## 2017-10-23 DIAGNOSIS — Z96611 Presence of right artificial shoulder joint: Secondary | ICD-10-CM | POA: Diagnosis not present

## 2017-10-23 DIAGNOSIS — Z471 Aftercare following joint replacement surgery: Secondary | ICD-10-CM | POA: Diagnosis not present

## 2017-10-29 DIAGNOSIS — Z471 Aftercare following joint replacement surgery: Secondary | ICD-10-CM | POA: Diagnosis not present

## 2017-10-29 DIAGNOSIS — I5022 Chronic systolic (congestive) heart failure: Secondary | ICD-10-CM | POA: Diagnosis not present

## 2017-10-29 DIAGNOSIS — I110 Hypertensive heart disease with heart failure: Secondary | ICD-10-CM | POA: Diagnosis not present

## 2017-10-29 DIAGNOSIS — G35 Multiple sclerosis: Secondary | ICD-10-CM | POA: Diagnosis not present

## 2017-10-29 DIAGNOSIS — Z96611 Presence of right artificial shoulder joint: Secondary | ICD-10-CM | POA: Diagnosis not present

## 2017-10-29 DIAGNOSIS — I482 Chronic atrial fibrillation: Secondary | ICD-10-CM | POA: Diagnosis not present

## 2017-10-31 DIAGNOSIS — Z96611 Presence of right artificial shoulder joint: Secondary | ICD-10-CM | POA: Diagnosis not present

## 2017-10-31 DIAGNOSIS — I110 Hypertensive heart disease with heart failure: Secondary | ICD-10-CM | POA: Diagnosis not present

## 2017-10-31 DIAGNOSIS — G35 Multiple sclerosis: Secondary | ICD-10-CM | POA: Diagnosis not present

## 2017-10-31 DIAGNOSIS — I482 Chronic atrial fibrillation: Secondary | ICD-10-CM | POA: Diagnosis not present

## 2017-10-31 DIAGNOSIS — Z471 Aftercare following joint replacement surgery: Secondary | ICD-10-CM | POA: Diagnosis not present

## 2017-10-31 DIAGNOSIS — I5022 Chronic systolic (congestive) heart failure: Secondary | ICD-10-CM | POA: Diagnosis not present

## 2017-11-05 DIAGNOSIS — I5022 Chronic systolic (congestive) heart failure: Secondary | ICD-10-CM | POA: Diagnosis not present

## 2017-11-05 DIAGNOSIS — Z471 Aftercare following joint replacement surgery: Secondary | ICD-10-CM | POA: Diagnosis not present

## 2017-11-05 DIAGNOSIS — G35 Multiple sclerosis: Secondary | ICD-10-CM | POA: Diagnosis not present

## 2017-11-05 DIAGNOSIS — I482 Chronic atrial fibrillation: Secondary | ICD-10-CM | POA: Diagnosis not present

## 2017-11-05 DIAGNOSIS — I110 Hypertensive heart disease with heart failure: Secondary | ICD-10-CM | POA: Diagnosis not present

## 2017-11-05 DIAGNOSIS — Z96611 Presence of right artificial shoulder joint: Secondary | ICD-10-CM | POA: Diagnosis not present

## 2017-11-06 DIAGNOSIS — I110 Hypertensive heart disease with heart failure: Secondary | ICD-10-CM | POA: Diagnosis not present

## 2017-11-06 DIAGNOSIS — I5022 Chronic systolic (congestive) heart failure: Secondary | ICD-10-CM | POA: Diagnosis not present

## 2017-11-06 DIAGNOSIS — I482 Chronic atrial fibrillation: Secondary | ICD-10-CM | POA: Diagnosis not present

## 2017-11-06 DIAGNOSIS — G35 Multiple sclerosis: Secondary | ICD-10-CM | POA: Diagnosis not present

## 2017-11-06 DIAGNOSIS — Z96611 Presence of right artificial shoulder joint: Secondary | ICD-10-CM | POA: Diagnosis not present

## 2017-11-06 DIAGNOSIS — Z471 Aftercare following joint replacement surgery: Secondary | ICD-10-CM | POA: Diagnosis not present

## 2017-11-18 DIAGNOSIS — Z4789 Encounter for other orthopedic aftercare: Secondary | ICD-10-CM | POA: Diagnosis not present

## 2017-12-20 ENCOUNTER — Other Ambulatory Visit: Payer: Self-pay | Admitting: Pediatrics

## 2017-12-20 DIAGNOSIS — K5900 Constipation, unspecified: Secondary | ICD-10-CM

## 2017-12-22 ENCOUNTER — Encounter: Payer: Self-pay | Admitting: Physical Therapy

## 2017-12-22 ENCOUNTER — Ambulatory Visit: Payer: Medicare Other | Attending: Orthopedic Surgery | Admitting: Physical Therapy

## 2017-12-22 DIAGNOSIS — R293 Abnormal posture: Secondary | ICD-10-CM | POA: Insufficient documentation

## 2017-12-22 DIAGNOSIS — G8929 Other chronic pain: Secondary | ICD-10-CM | POA: Insufficient documentation

## 2017-12-22 DIAGNOSIS — M545 Low back pain: Secondary | ICD-10-CM | POA: Insufficient documentation

## 2017-12-22 DIAGNOSIS — M25511 Pain in right shoulder: Secondary | ICD-10-CM | POA: Diagnosis not present

## 2017-12-22 DIAGNOSIS — M6281 Muscle weakness (generalized): Secondary | ICD-10-CM | POA: Insufficient documentation

## 2017-12-22 DIAGNOSIS — M25611 Stiffness of right shoulder, not elsewhere classified: Secondary | ICD-10-CM | POA: Diagnosis not present

## 2017-12-22 NOTE — Therapy (Signed)
[facility] [place], [place], [address] Phone: [phone]   Fax:  [phone]  Physical Therapy Evaluation  Patient Details  Name: Virginia Young MRN: [id number] Date of Birth: 05/09/1941 Referring Provider: Esmond Plants MD.   Encounter Date: 12/22/2017  PT End of Session - 12/22/17 1404    Visit Number  1    Number of Visits  12    Date for PT Re-Evaluation  01/19/18    Authorization Type  10TH VISIT PROGRESS NOTE AND KX MODIFIER AFTER THE 15 VISIT.    PT Start Time  0143    PT Stop Time  0218    PT Time Calculation (min)  35 min    Activity Tolerance  Patient tolerated treatment well    Behavior During Therapy  [facility] for tasks assessed/performed       Past Medical History:  Diagnosis Date  . Allergy   . Atrial fibrillation (HCC)    a. coumadin;  b. Amiodarone  . Cataract   . Chest pain 03/2012   a. Lex MV 10/13:  EF 64%, dist ant and apical defect sugg of soft tissue atten, no ischemia  . Chronic systolic heart failure ([place])   . Depression   . Dysrhythmia   . GERD (gastroesophageal reflux disease)   . HLD (hyperlipidemia)   . Incontinence of urine   . Mild mitral regurgitation   . MS (multiple sclerosis) ([place])   . NICM (nonischemic cardiomyopathy) ([place])    a. neg CLite in 2003;  b. EF 40-45% in past;   c.  Echo 12/11: EF 35-40%, mild MR, mild LAE, mild RAE, small pericardial effusion   . Osteoarthritis   . Osteoporosis   . Stasis ulcer ([place])   . Uterine prolapse   . Vaginal prolapse without uterine prolapse   . Varicose veins     Past Surgical History:  Procedure Laterality Date  . ANTERIOR AND POSTERIOR VAGINAL REPAIR W/ SACROSPINOUS LIGAMENT SUSPENSION  2016   [facility]  . BLADDER SURGERY    . CARPAL TUNNEL RELEASE Right 08/22/2017   Procedure: CARPAL TUNNEL RELEASE;  Surgeon: [name], MD;  Location: [facility];  Service: Orthopedics;  Laterality: Right;  . cataract surgery Bilateral   . EYE SURGERY     . INGUINAL HERNIA REPAIR     right  . LIPOMA EXCISION     h/o removed from upper back x 2  . REVERSE SHOULDER ARTHROPLASTY Right 08/22/2017   Procedure: REVERSE SHOULDER ARTHROPLASTY;  Surgeon: [name], MD;  Location: [facility];  Service: Orthopedics;  Laterality: Right;    There were no vitals filed for this visit.   Subjective Assessment - 12/22/17 1407    Subjective  The patient underwent a right reverse totalk shoulder replcement surgery on 08/22/17.  She said it was doing good so she did not start therapy.  She reports, however, that she slipped on wet grass and fell.  This increased her pain at great deal and she is not able to raise her right arm much now.  Her pain is a 6/10 today and higher with movement of her left shoulder.  Heat and ice decrease her pain.    Pertinent History  MS; OP.    Patient Stated Goals  Move left shoulder with less pain.    Currently in Pain?  Yes    Pain Score  6     Pain Location  Shoulder    Pain Orientation  Right  Pain Descriptors / Indicators  Aching;Sharp    Pain Type  Surgical pain    Pain Onset  More than a month ago    Pain Frequency  Constant    Aggravating Factors   See above.    Pain Relieving Factors  See above.         [facility] PT Assessment - 12/22/17 0001      Assessment   Medical Diagnosis  S/p right reverse total shoulder.    Referring Provider  Esmond Plants MD.    Onset Date/Surgical Date  -- 08/22/17(surgery date).      Precautions   Precautions  -- Per MD order:  Gentle ROM (AAROM) and strengthening.      Balance Screen   Has the patient fallen in the past 6 months  Yes    How many times?  -- 1.    Has the patient had a decrease in activity level because of a fear of falling?   Yes    Is the patient reluctant to leave their home because of a fear of falling?   No      Home Environment   Living Environment  Private residence      Prior Function   Level of Independence  Independent      Observation/Other  Assessments   Observations  Right anterior shoulder incision well healed.      Posture/Postural Control   Posture/Postural Control  Postural limitations    Postural Limitations  Rounded Shoulders;Forward head      AROM   Overall AROM Comments  AAROM into right shoulder flexion limited to 52 degrees and ER= -12 degrees from neutral position.      Palpation   Palpation comment  Very tender to palpation over right anterior shoulder region.  Her bicipital groove ("deeper") is easily palpable when contralaterally compared.      Ambulation/Gait   Gait Comments  Patient ambulates with a straight cane.                Objective measurements completed on examination: See above findings.      [facility] Adult PT Treatment/Exercise - 12/22/17 0001      Modalities   Modalities  Electrical Stimulation;Vasopneumatic      Electrical Stimulation   Electrical Stimulation Location  Right shoulder.    Electrical Stimulation Action  IFC    Electrical Stimulation Parameters  80-150 Hz x 10 minutes.    Electrical Stimulation Goals  Pain      Vasopneumatic   Number Minutes Vasopneumatic   10 minutes    Vasopnuematic Location   -- Right shoulder.    Vasopneumatic Pressure  Low Pillow between right elbow and thorax.               PT Short Term Goals - 12/22/17 1428      PT SHORT TERM GOAL #1   Title  STG's=LTG's.        PT Long Term Goals - 12/22/17 1429      PT LONG TERM GOAL #1   Title  Independent with a HEP.    Time  4    Period  Weeks    Status  New      PT LONG TERM GOAL #2   Title  Active right shoulder flexion to 145 degrees so the patient can easily reach overhead    Time  4    Period  Weeks    Status  New      PT  LONG TERM GOAL #3   Title  Active ER to 70 degrees+ to allow for easily donning/doffing of apparel    Time  4    Status  New      PT LONG TERM GOAL #4   Title  Increase right shoulder and right LE strength to a solid 4/5 to increase stability  for performance of functional activities    Time  4    Period  Weeks    Status  New      PT LONG TERM GOAL #5   Title  Perform ADL's with right shoulder pain not > 3/10.    Time  4    Period  Weeks    Status  New               Patient will benefit from skilled therapeutic intervention in order to improve the following deficits and impairments:     Visit Diagnosis: Chronic right shoulder pain - Plan: PT plan of care cert/re-cert  Stiffness of right shoulder, not elsewhere classified - Plan: PT plan of care cert/re-cert  Muscle weakness (generalized) - Plan: PT plan of care cert/re-cert     Problem List Patient Active Problem List   Diagnosis Date Noted  . S/P shoulder replacement, right 08/22/2017  . HTN (hypertension) 02/19/2016  . Post-nasal drip 05/15/2015  . Postoperative abdominal pain 12/07/2014  . Voiding dysfunction 10/07/2014  . SUI (stress urinary incontinence, female) 10/07/2014  . Osteoporosis with fracture 04/06/2014  . Impacted cerumen of both ears 08/20/2013  . Encounter for therapeutic drug monitoring 08/20/2013  . DOE (dyspnea on exertion) 07/23/2013  . Helicobacter positive gastritis 06/21/2013  . Orthostatic hypotension 06/10/2013  . UTI (lower urinary tract infection) 05/06/2013  . Neck mass 02/19/2013  . Neck pain, bilateral 02/19/2013  . Right groin mass 11/08/2011  . Low back pain 11/08/2011  . Lipoma of neck 06/21/2011  . Memory loss 06/21/2011  . OVERACTIVE BLADDER 08/21/2010  . Long term current use of anticoagulant 07/25/2010  . Osteoarthritis 01/18/2010  . KNEE PAIN 01/18/2010  . FOOT PAIN 01/18/2010  . Hyperlipidemia with target LDL less than 100 04/20/2009  . GERD 04/17/2009  . MITRAL REGURGITATION, MILD 08/30/2008  . NICM (nonischemic cardiomyopathy) ([place]) 08/30/2008  . Congestive heart failure ([place]) 04/13/2007  . DEPRESSION 04/10/2007  . DISEASE, MITRAL VALVE NEC/NOS 04/10/2007  . Atrial fibrillation ([place]) 04/10/2007   . STASIS ULCER 04/10/2007  . VENOUS INSUFFICIENCY, LEGS 04/10/2007    Katelen Luepke, [place]  MPT 12/22/2017, 2:31 PM  [facility] [place], [place], [address] Phone: [phone]   Fax:  [phone]  Name: Virginia Young MRN: [id number] Date of Birth: [birth year]

## 2017-12-23 ENCOUNTER — Ambulatory Visit (INDEPENDENT_AMBULATORY_CARE_PROVIDER_SITE_OTHER): Payer: Medicare Other

## 2017-12-23 ENCOUNTER — Ambulatory Visit (INDEPENDENT_AMBULATORY_CARE_PROVIDER_SITE_OTHER): Payer: Medicare Other | Admitting: Family Medicine

## 2017-12-23 ENCOUNTER — Ambulatory Visit: Payer: Medicare Other | Admitting: Physical Therapy

## 2017-12-23 ENCOUNTER — Encounter: Payer: Self-pay | Admitting: Family Medicine

## 2017-12-23 ENCOUNTER — Encounter: Payer: Self-pay | Admitting: Physical Therapy

## 2017-12-23 VITALS — BP 150/88 | HR 70 | Temp 96.8°F | Ht 70.0 in | Wt 174.6 lb

## 2017-12-23 DIAGNOSIS — M25611 Stiffness of right shoulder, not elsewhere classified: Secondary | ICD-10-CM

## 2017-12-23 DIAGNOSIS — Z96611 Presence of right artificial shoulder joint: Secondary | ICD-10-CM | POA: Diagnosis not present

## 2017-12-23 DIAGNOSIS — G8929 Other chronic pain: Secondary | ICD-10-CM

## 2017-12-23 DIAGNOSIS — R293 Abnormal posture: Secondary | ICD-10-CM | POA: Diagnosis not present

## 2017-12-23 DIAGNOSIS — M25511 Pain in right shoulder: Secondary | ICD-10-CM | POA: Diagnosis not present

## 2017-12-23 DIAGNOSIS — Z471 Aftercare following joint replacement surgery: Secondary | ICD-10-CM | POA: Diagnosis not present

## 2017-12-23 DIAGNOSIS — M545 Low back pain: Secondary | ICD-10-CM | POA: Diagnosis not present

## 2017-12-23 DIAGNOSIS — M6281 Muscle weakness (generalized): Secondary | ICD-10-CM

## 2017-12-23 NOTE — Patient Instructions (Signed)
Great to see you!  Your Xrays look ok, we have had someone at your orthopedic surgeon's office look at them as well.   Keep your appointment with them on July 16, if your pain is severe or worsening please call them for an appointment sooner.

## 2017-12-23 NOTE — Therapy (Signed)
[facility] [place], [place], [address] Phone: [phone]   Fax:  [phone]  Physical Therapy Treatment  Patient Details  Name: Virginia Young MRN: [id number] Date of Birth: 1940/10/10 Referring Provider: Esmond Plants MD.   Encounter Date: 12/23/2017  PT End of Session - 12/23/17 0824    Visit Number  2    Number of Visits  12    Date for PT Re-Evaluation  01/19/18    Authorization Type  10TH VISIT PROGRESS NOTE AND KX MODIFIER AFTER THE 15 VISIT.    PT Start Time  0817    PT Stop Time  0903    PT Time Calculation (min)  46 min    Activity Tolerance  Patient tolerated treatment well    Behavior During Therapy  [facility] for tasks assessed/performed       Past Medical History:  Diagnosis Date  . Allergy   . Atrial fibrillation (HCC)    a. coumadin;  b. Amiodarone  . Cataract   . Chest pain 03/2012   a. Lex MV 10/13:  EF 64%, dist ant and apical defect sugg of soft tissue atten, no ischemia  . Chronic systolic heart failure ([place])   . Depression   . Dysrhythmia   . GERD (gastroesophageal reflux disease)   . HLD (hyperlipidemia)   . Incontinence of urine   . Mild mitral regurgitation   . MS (multiple sclerosis) ([place])   . NICM (nonischemic cardiomyopathy) ([place])    a. neg CLite in 2003;  b. EF 40-45% in past;   c.  Echo 12/11: EF 35-40%, mild MR, mild LAE, mild RAE, small pericardial effusion   . Osteoarthritis   . Osteoporosis   . Stasis ulcer ([place])   . Uterine prolapse   . Vaginal prolapse without uterine prolapse   . Varicose veins     Past Surgical History:  Procedure Laterality Date  . ANTERIOR AND POSTERIOR VAGINAL REPAIR W/ SACROSPINOUS LIGAMENT SUSPENSION  2016   [facility]  . BLADDER SURGERY    . CARPAL TUNNEL RELEASE Right 08/22/2017   Procedure: CARPAL TUNNEL RELEASE;  Surgeon: [name], MD;  Location: [facility];  Service: Orthopedics;  Laterality: Right;  . cataract surgery Bilateral   . EYE SURGERY     . INGUINAL HERNIA REPAIR     right  . LIPOMA EXCISION     h/o removed from upper back x 2  . REVERSE SHOULDER ARTHROPLASTY Right 08/22/2017   Procedure: REVERSE SHOULDER ARTHROPLASTY;  Surgeon: [name], MD;  Location: [facility];  Service: Orthopedics;  Laterality: Right;    There were no vitals filed for this visit.  Subjective Assessment - 12/23/17 0823    Subjective  Reports that she has to hold her arm to drive.    Pertinent History  MS; OP.    Limitations  Walking    How long can you stand comfortably?  <10 minutes.    How long can you walk comfortably?  Short distances.    Diagnostic tests  NCV test to right UE.  X-rays    Patient Stated Goals  Move left shoulder with less pain.    Currently in Pain?  Yes    Pain Score  5     Pain Location  Shoulder    Pain Orientation  Right    Pain Descriptors / Indicators  Sharp    Pain Type  Surgical pain    Pain Onset  More than a month ago  Pain Frequency  Intermittent         [facility] PT Assessment - 12/23/17 0001      Assessment   Medical Diagnosis  S/p right reverse total shoulder.    Onset Date/Surgical Date  08/22/17                   [facility] Adult PT Treatment/Exercise - 12/23/17 0001      Shoulder Exercises: Supine   External Rotation  AAROM;Right;20 reps    Flexion  AAROM;Both;Other (comment) Attempted but unable secondary to pain      Shoulder Exercises: Seated   Other Seated Exercises  Seated UE ranger circles and flexion x30 reps each      Shoulder Exercises: Pulleys   Flexion  Other (comment) Attempted but unable secondary to pain      Modalities   Modalities  Electrical Stimulation;Vasopneumatic      Electrical Stimulation   Electrical Stimulation Location  R shoulder    Electrical Stimulation Action  IFC    Electrical Stimulation Parameters  80-150 hz x15 min    Electrical Stimulation Goals  Pain      Vasopneumatic   Number Minutes Vasopneumatic   15 minutes    Vasopnuematic Location    Shoulder    Vasopneumatic Pressure  Low    Vasopneumatic Temperature   34      Manual Therapy   Manual Therapy  Passive ROM    Passive ROM  PROM of R shoulder into flexion to assist with improvement of ROM               PT Short Term Goals - 12/22/17 1428      PT SHORT TERM GOAL #1   Title  STG's=LTG's.        PT Long Term Goals - 12/22/17 1429      PT LONG TERM GOAL #1   Title  Independent with a HEP.    Time  4    Period  Weeks    Status  New      PT LONG TERM GOAL #2   Title  Active right shoulder flexion to 145 degrees so the patient can easily reach overhead    Time  4    Period  Weeks    Status  New      PT LONG TERM GOAL #3   Title  Active ER to 70 degrees+ to allow for easily donning/doffing of apparel    Time  4    Status  New      PT LONG TERM GOAL #4   Title  Increase right shoulder and right LE strength to a solid 4/5 to increase stability for performance of functional activities    Time  4    Period  Weeks    Status  New      PT LONG TERM GOAL #5   Title  Perform ADL's with right shoulder pain not > 3/10.    Time  4    Period  Weeks    Status  New            Plan - 12/23/17 0915    Clinical Impression Statement  Patient very limited with PT session today per R shoulder pain. Patient not able to tolerate any R shoulder antigravity exercises such as pulley or AAROM protraction. AAROM flexion not attempted per pain. Patient unaffected by [place] ER in supine and able to complete fairly well. PROM of R shoulder completed into flexion  and tolerated better with VCs for relaxation. PROM of R shoulder tolerated to approximately 90 deg without pain as long as patient's RUE relaxed. Patient intermittantly reported pain and would grab her arm for comfort. Patient highly recommended by PTA to get R shoulder x-rayed to ensure no fractures from her fall in her yard. Normal modalities response noted following removal of the modalities.    PT Frequency  2x  / week    PT Duration  8 weeks    PT Treatment/Interventions  ADLs/Self Care Home Management;Cryotherapy;Electrical Stimulation;Ultrasound;Moist Heat;Functional mobility training;Therapeutic activities;Therapeutic exercise;Neuromuscular re-education;Patient/family education;Passive range of motion;Vasopneumatic Device;Manual techniques    PT Next Visit Plan  Continue to monitor symptoms in RUE.    Consulted and Agree with Plan of Care  Patient       Patient will benefit from skilled therapeutic intervention in order to improve the following deficits and impairments:  Pain, Decreased strength, Impaired UE functional use, Decreased range of motion, Postural dysfunction  Visit Diagnosis: Chronic right shoulder pain  Stiffness of right shoulder, not elsewhere classified  Muscle weakness (generalized)     Problem List Patient Active Problem List   Diagnosis Date Noted  . S/P shoulder replacement, right 08/22/2017  . HTN (hypertension) 02/19/2016  . Post-nasal drip 05/15/2015  . Postoperative abdominal pain 12/07/2014  . Voiding dysfunction 10/07/2014  . SUI (stress urinary incontinence, female) 10/07/2014  . Osteoporosis with fracture 04/06/2014  . Impacted cerumen of both ears 08/20/2013  . Encounter for therapeutic drug monitoring 08/20/2013  . DOE (dyspnea on exertion) 07/23/2013  . Helicobacter positive gastritis 06/21/2013  . Orthostatic hypotension 06/10/2013  . UTI (lower urinary tract infection) 05/06/2013  . Neck mass 02/19/2013  . Neck pain, bilateral 02/19/2013  . Right groin mass 11/08/2011  . Low back pain 11/08/2011  . Lipoma of neck 06/21/2011  . Memory loss 06/21/2011  . OVERACTIVE BLADDER 08/21/2010  . Long term current use of anticoagulant 07/25/2010  . Osteoarthritis 01/18/2010  . KNEE PAIN 01/18/2010  . FOOT PAIN 01/18/2010  . Hyperlipidemia with target LDL less than 100 04/20/2009  . GERD 04/17/2009  . MITRAL REGURGITATION, MILD 08/30/2008  . NICM  (nonischemic cardiomyopathy) ([place]) 08/30/2008  . Congestive heart failure ([place]) 04/13/2007  . DEPRESSION 04/10/2007  . DISEASE, MITRAL VALVE NEC/NOS 04/10/2007  . Atrial fibrillation ([place]) 04/10/2007  . STASIS ULCER 04/10/2007  . VENOUS INSUFFICIENCY, LEGS 04/10/2007    Standley Brooking, PTA 12/23/2017, 9:27 AM  [facility] [address] [place], [place], [address] Phone: [phone]   Fax:  [phone]  Name: Virginia Young MRN: [id number] Date of Birth: [birth year]

## 2017-12-23 NOTE — Progress Notes (Signed)
   HPI  Patient presents today here for right shoulder pain.  Patient has history of shoulder replacement on March 1.  She states that she was leaving physical therapy one day when she slipped in the grass landing on her back in her right shoulder.  This was about 2 weeks ago.  Since that time she has had limited range of motion due to pain and difficulty accomplishing her exercises and physical therapy.   PMH: Smoking status noted ROS: Per HPI  Objective: BP (!) 150/88   Pulse 70   Temp (!) 96.8 F (36 C) (Oral)   Ht 5\' 10"  (1.778 m)   Wt 174 lb 9.6 oz (79.2 kg)   BMI 25.05 kg/m  Gen: NAD, alert, cooperative with exam HEENT: NCAT CV: RRR, good S1/S2, no murmur Resp: CTABL, no wheezes, non-labored Ext: No edema, warm Neuro: Alert and oriented, No gross deficits MSK:  Shoulder pain with palpation over the long head of the biceps and the deltoid laterally Limited ROM actively beyond 30 degress of forward flexion, can passive lift to around 60 degrees of forward flexion.   Assessment and plan:  # R shoulder pain Acute pain after the fall, recent Hx shoulder surgery.  Xray reviewed with hardware appearing to be intact, we have also asked her orthopedic's office to review and they state hardware looks good.  ROM limited- continue PT, call [facility] for an appt sooner than July 16 (scheduled) if worsening    Orders Placed This Encounter  Procedures  . DG Shoulder Right    Standing Status:   Future    Number of Occurrences:   1    Standing Expiration Date:   02/22/2019    Order Specific Question:   Reason for Exam (SYMPTOM  OR DIAGNOSIS REQUIRED)    Answer:   fall    Order Specific Question:   Preferred imaging location?    Answer:   Internal     Laroy Apple, MD [facility] 12/23/2017, 9:58 AM

## 2017-12-29 ENCOUNTER — Encounter: Payer: Self-pay | Admitting: Physical Therapy

## 2017-12-29 ENCOUNTER — Ambulatory Visit: Payer: Medicare Other | Admitting: Physical Therapy

## 2017-12-29 DIAGNOSIS — M6281 Muscle weakness (generalized): Secondary | ICD-10-CM

## 2017-12-29 DIAGNOSIS — M25611 Stiffness of right shoulder, not elsewhere classified: Secondary | ICD-10-CM | POA: Diagnosis not present

## 2017-12-29 DIAGNOSIS — M25511 Pain in right shoulder: Principal | ICD-10-CM

## 2017-12-29 DIAGNOSIS — R293 Abnormal posture: Secondary | ICD-10-CM | POA: Diagnosis not present

## 2017-12-29 DIAGNOSIS — G8929 Other chronic pain: Secondary | ICD-10-CM | POA: Diagnosis not present

## 2017-12-29 DIAGNOSIS — M545 Low back pain: Secondary | ICD-10-CM | POA: Diagnosis not present

## 2017-12-29 NOTE — Therapy (Signed)
[facility] [place], [place], [address] Phone: [phone]   Fax:  [phone]  Physical Therapy Treatment  Patient Details  Name: Virginia Young MRN: [id number] Date of Birth: Nov 13, 1940 Referring Provider: Esmond Plants MD.   Encounter Date: 12/29/2017  PT End of Session - 12/29/17 1103    Visit Number  3    Number of Visits  12    Date for PT Re-Evaluation  01/19/18    Authorization Type  10TH VISIT PROGRESS NOTE AND KX MODIFIER AFTER THE 15 VISIT.    PT Start Time  [phone]    PT Stop Time  1036    PT Time Calculation (min)  50 min    Activity Tolerance  Patient tolerated treatment well    Behavior During Therapy  WFL for tasks assessed/performed       Past Medical History:  Diagnosis Date  . Allergy   . Atrial fibrillation (HCC)    a. coumadin;  b. Amiodarone  . Cataract   . Chest pain 03/2012   a. Lex MV 10/13:  EF 64%, dist ant and apical defect sugg of soft tissue atten, no ischemia  . Chronic systolic heart failure ([place])   . Depression   . Dysrhythmia   . GERD (gastroesophageal reflux disease)   . HLD (hyperlipidemia)   . Incontinence of urine   . Mild mitral regurgitation   . MS (multiple sclerosis) ([place])   . NICM (nonischemic cardiomyopathy) ([place])    a. neg CLite in 2003;  b. EF 40-45% in past;   c.  Echo 12/11: EF 35-40%, mild MR, mild LAE, mild RAE, small pericardial effusion   . Osteoarthritis   . Osteoporosis   . Stasis ulcer ([place])   . Uterine prolapse   . Vaginal prolapse without uterine prolapse   . Varicose veins     Past Surgical History:  Procedure Laterality Date  . ANTERIOR AND POSTERIOR VAGINAL REPAIR W/ SACROSPINOUS LIGAMENT SUSPENSION  2016   [facility]  . BLADDER SURGERY    . CARPAL TUNNEL RELEASE Right 08/22/2017   Procedure: CARPAL TUNNEL RELEASE;  Surgeon: [name], MD;  Location: [facility];  Service: Orthopedics;  Laterality: Right;  . cataract surgery Bilateral   . EYE SURGERY     . INGUINAL HERNIA REPAIR     right  . LIPOMA EXCISION     h/o removed from upper back x 2  . REVERSE SHOULDER ARTHROPLASTY Right 08/22/2017   Procedure: REVERSE SHOULDER ARTHROPLASTY;  Surgeon: [name], MD;  Location: [facility];  Service: Orthopedics;  Laterality: Right;    There were no vitals filed for this visit.  Subjective Assessment - 12/29/17 1059    Subjective  Reports that her arm doesn't feel much better.    Pertinent History  MS; OP.    Limitations  Walking    How long can you stand comfortably?  <10 minutes.    How long can you walk comfortably?  Short distances.    Diagnostic tests  NCV test to right UE.  X-rays    Patient Stated Goals  Move left shoulder with less pain.    Currently in Pain?  Other (Comment) No pain assessment provided by patient         [facility] PT Assessment - 12/29/17 0001      Assessment   Medical Diagnosis  S/p right reverse total shoulder.    Onset Date/Surgical Date  08/22/17  [place] Adult PT Treatment/Exercise - 12/29/17 0001      Shoulder Exercises: Seated   Other Seated Exercises  Seated UE ranger circles and flexion x5 min      Shoulder Exercises: Standing   Other Standing Exercises  Wall ladder x5 min level 16-18      Shoulder Exercises: Pulleys   Flexion  Other (comment) Attempted but still very painful      Modalities   Modalities  Electrical Stimulation;Moist Heat      Moist Heat Therapy   Number Minutes Moist Heat  15 Minutes    Moist Heat Location  Shoulder      Electrical Stimulation   Electrical Stimulation Location  R shoulder    Electrical Stimulation Action  IFC    Electrical Stimulation Parameters  80-150 hz x15 min    Electrical Stimulation Goals  Pain      Manual Therapy   Manual Therapy  Passive ROM;Soft tissue mobilization    Soft tissue mobilization  STW to R bicep, deltoids to reduce pain and muscle tightness    Passive ROM  PROM of R shoulder into flexion, ER, IR with  gentle holds at end range               PT Short Term Goals - 12/22/17 1428      PT SHORT TERM GOAL #1   Title  STG's=LTG's.        PT Long Term Goals - 12/22/17 1429      PT LONG TERM GOAL #1   Title  Independent with a HEP.    Time  4    Period  Weeks    Status  New      PT LONG TERM GOAL #2   Title  Active right shoulder flexion to 145 degrees so the patient can easily reach overhead    Time  4    Period  Weeks    Status  New      PT LONG TERM GOAL #3   Title  Active ER to 70 degrees+ to allow for easily donning/doffing of apparel    Time  4    Status  New      PT LONG TERM GOAL #4   Title  Increase right shoulder and right LE strength to a solid 4/5 to increase stability for performance of functional activities    Time  4    Period  Weeks    Status  New      PT LONG TERM GOAL #5   Title  Perform ADL's with right shoulder pain not > 3/10.    Time  4    Period  Weeks    Status  New            Plan - 12/29/17 1109    Clinical Impression Statement  Patient continues to be very limited secondary to R shoulder pain. Patient went to PCP and was xrayed with no fractures detected. Patient unable to tolerate pulleys today due to pain. Patient progressed through more AAROM exercises such as seated UE ranger and wall ladder. Patient intermittantly experienced pain secondary to certain movement especially in eccentric action. Patient had to be reminded intermittantly throughout PROM session to reduce pain. No bicep movement noted with elbow flexion AROM by patient. Normal modalities response noted following removal of the modalities.    PT Frequency  2x / week    PT Duration  8 weeks    PT Treatment/Interventions  ADLs/Self Care  Home Management;Cryotherapy;Electrical Stimulation;Ultrasound;Moist Heat;Functional mobility training;Therapeutic activities;Therapeutic exercise;Neuromuscular re-education;Patient/family education;Passive range of motion;Vasopneumatic  Device;Manual techniques    PT Next Visit Plan  Continue to monitor symptoms in RUE.    Consulted and Agree with Plan of Care  Patient       Patient will benefit from skilled therapeutic intervention in order to improve the following deficits and impairments:  Pain, Decreased strength, Impaired UE functional use, Decreased range of motion, Postural dysfunction  Visit Diagnosis: Chronic right shoulder pain  Stiffness of right shoulder, not elsewhere classified  Muscle weakness (generalized)     Problem List Patient Active Problem List   Diagnosis Date Noted  . S/P shoulder replacement, right 08/22/2017  . HTN (hypertension) 02/19/2016  . Post-nasal drip 05/15/2015  . Postoperative abdominal pain 12/07/2014  . Voiding dysfunction 10/07/2014  . SUI (stress urinary incontinence, female) 10/07/2014  . Osteoporosis with fracture 04/06/2014  . Impacted cerumen of both ears 08/20/2013  . Encounter for therapeutic drug monitoring 08/20/2013  . DOE (dyspnea on exertion) 07/23/2013  . Helicobacter positive gastritis 06/21/2013  . Orthostatic hypotension 06/10/2013  . UTI (lower urinary tract infection) 05/06/2013  . Neck mass 02/19/2013  . Neck pain, bilateral 02/19/2013  . Right groin mass 11/08/2011  . Low back pain 11/08/2011  . Lipoma of neck 06/21/2011  . Memory loss 06/21/2011  . OVERACTIVE BLADDER 08/21/2010  . Long term current use of anticoagulant 07/25/2010  . Osteoarthritis 01/18/2010  . KNEE PAIN 01/18/2010  . FOOT PAIN 01/18/2010  . Hyperlipidemia with target LDL less than 100 04/20/2009  . GERD 04/17/2009  . MITRAL REGURGITATION, MILD 08/30/2008  . NICM (nonischemic cardiomyopathy) ([place]) 08/30/2008  . Congestive heart failure ([place]) 04/13/2007  . DEPRESSION 04/10/2007  . DISEASE, MITRAL VALVE NEC/NOS 04/10/2007  . Atrial fibrillation ([place]) 04/10/2007  . STASIS ULCER 04/10/2007  . VENOUS INSUFFICIENCY, LEGS 04/10/2007    Standley Brooking, PTA 12/29/2017, 11:38  AM  [facility] [address] [place], [place], [address] Phone: [phone]   Fax:  [phone]  Name: YMANI PORCHER MRN: [id number] Date of Birth: [birth year]

## 2017-12-30 ENCOUNTER — Other Ambulatory Visit: Payer: Self-pay | Admitting: Pediatrics

## 2017-12-31 ENCOUNTER — Encounter: Payer: Self-pay | Admitting: Physical Therapy

## 2017-12-31 ENCOUNTER — Ambulatory Visit: Payer: Medicare Other | Admitting: Physical Therapy

## 2017-12-31 DIAGNOSIS — G8929 Other chronic pain: Secondary | ICD-10-CM

## 2017-12-31 DIAGNOSIS — M25611 Stiffness of right shoulder, not elsewhere classified: Secondary | ICD-10-CM | POA: Diagnosis not present

## 2017-12-31 DIAGNOSIS — M545 Low back pain, unspecified: Secondary | ICD-10-CM

## 2017-12-31 DIAGNOSIS — M25511 Pain in right shoulder: Principal | ICD-10-CM

## 2017-12-31 DIAGNOSIS — M6281 Muscle weakness (generalized): Secondary | ICD-10-CM | POA: Diagnosis not present

## 2017-12-31 DIAGNOSIS — R293 Abnormal posture: Secondary | ICD-10-CM

## 2017-12-31 NOTE — Therapy (Signed)
[facility] [place], [place], [address] Phone: [phone]   Fax:  [phone]  Physical Therapy Treatment  Patient Details  Name: Virginia Young MRN: [id number] Date of Birth: 07/10/1940 Referring Provider: Esmond Plants MD.   Encounter Date: 12/31/2017  PT End of Session - 12/31/17 0956    Visit Number  4    Number of Visits  12    Date for PT Re-Evaluation  01/19/18    Authorization Type  10TH VISIT PROGRESS NOTE AND KX MODIFIER AFTER THE 15 VISIT.    PT Start Time  0945    PT Stop Time  1040    PT Time Calculation (min)  55 min    Activity Tolerance  Patient tolerated treatment well    Behavior During Therapy  WFL for tasks assessed/performed       Past Medical History:  Diagnosis Date  . Allergy   . Atrial fibrillation (HCC)    a. coumadin;  b. Amiodarone  . Cataract   . Chest pain 03/2012   a. Lex MV 10/13:  EF 64%, dist ant and apical defect sugg of soft tissue atten, no ischemia  . Chronic systolic heart failure ([place])   . Depression   . Dysrhythmia   . GERD (gastroesophageal reflux disease)   . HLD (hyperlipidemia)   . Incontinence of urine   . Mild mitral regurgitation   . MS (multiple sclerosis) ([place])   . NICM (nonischemic cardiomyopathy) ([place])    a. neg CLite in 2003;  b. EF 40-45% in past;   c.  Echo 12/11: EF 35-40%, mild MR, mild LAE, mild RAE, small pericardial effusion   . Osteoarthritis   . Osteoporosis   . Stasis ulcer ([place])   . Uterine prolapse   . Vaginal prolapse without uterine prolapse   . Varicose veins     Past Surgical History:  Procedure Laterality Date  . ANTERIOR AND POSTERIOR VAGINAL REPAIR W/ SACROSPINOUS LIGAMENT SUSPENSION  2016   [facility]  . BLADDER SURGERY    . CARPAL TUNNEL RELEASE Right 08/22/2017   Procedure: CARPAL TUNNEL RELEASE;  Surgeon: [name], MD;  Location: [facility];  Service: Orthopedics;  Laterality: Right;  . cataract surgery Bilateral   . EYE SURGERY     . INGUINAL HERNIA REPAIR     right  . LIPOMA EXCISION     h/o removed from upper back x 2  . REVERSE SHOULDER ARTHROPLASTY Right 08/22/2017   Procedure: REVERSE SHOULDER ARTHROPLASTY;  Surgeon: [name], MD;  Location: [facility];  Service: Orthopedics;  Laterality: Right;    There were no vitals filed for this visit.  Subjective Assessment - 12/31/17 0951    Subjective  Pt reporting 3-4/10 pain in her R shoulder and bicep. Pt reporting she wants the heat again, because it made her arm feel better.     Pertinent History  MS; OP.    Limitations  Walking    How long can you stand comfortably?  <10 minutes.    How long can you walk comfortably?  Short distances.    Diagnostic tests  NCV test to right UE.  X-rays    Patient Stated Goals  Move left shoulder with less pain.    Currently in Pain?  Yes    Pain Score  4     Pain Location  Shoulder    Pain Orientation  Right    Pain Descriptors / Indicators  Aching;Sharp    Pain Type  Surgical pain    Pain Onset  More than a month ago    Aggravating Factors   movements in certain directions more with flexion, ER and abduction    Pain Relieving Factors  resting, heat                       [facility] Adult PT Treatment/Exercise - 12/31/17 0001      Shoulder Exercises: Supine   External Rotation  AAROM;Right;15 reps    Flexion  AAROM;5 reps;Limitations    Flexion Limitations  pt required assistance to lift the cane and maintain elbow extension and assisted required with shoulder fleixon      Shoulder Exercises: Seated   Other Seated Exercises  Seated UE ranger circles and flexion x5 min      Shoulder Exercises: Pulleys   Flexion  3 minutes;Limitations    Flexion Limitations  pt had to stop a few times due to sharp pain      Moist Heat Therapy   Number Minutes Moist Heat  15 Minutes    Moist Heat Location  Shoulder      Electrical Stimulation   Electrical Stimulation Location  R shoulder    Electrical Stimulation  Action  pre mod    Electrical Stimulation Parameters  80-150 Hz x 15 minutes    Electrical Stimulation Goals  Pain      Manual Therapy   Manual Therapy  Passive ROM;Soft tissue mobilization    Soft tissue mobilization  STW to R bicep  to reduce pain and muscle tightness    Passive ROM  PROM of R shoulder into flexion, ER, IR with gentle holds at end range             PT Education - 12/31/17 0953    Education Details  Pt reported she wasn't doing any exercises for her shoulder, pt was edu in importance her HEP and movement, table slides and  isometrics     Person(s) Educated  Patient    Methods  Explanation;Demonstration    Comprehension  Verbalized understanding;Returned demonstration       PT Short Term Goals - 12/22/17 1428      PT SHORT TERM GOAL #1   Title  STG's=LTG's.        PT Long Term Goals - 12/31/17 0959      PT LONG TERM GOAL #1   Title  Independent with a HEP.    Baseline  instructed in table slides, isometrics 4 way    Time  4    Period  Weeks    Status  On-going      PT LONG TERM GOAL #2   Title  Active right shoulder flexion to 145 degrees so the patient can easily reach overhead    Time  4    Period  Weeks    Status  New      PT LONG TERM GOAL #3   Title  Active ER to 70 degrees+ to allow for easily donning/doffing of apparel    Time  4    Period  Weeks    Status  New      PT LONG TERM GOAL #4   Title  Increase right shoulder and right LE strength to a solid 4/5 to increase stability for performance of functional activities    Time  4    Period  Weeks    Status  New      PT LONG TERM GOAL #5  Title  Perform ADL's with right shoulder pain not > 3/10.    Time  4    Period  Weeks    Status  New            Plan - 12/31/17 1033    Clinical Impression Statement  Pt continues to have shoulder ROM limiations and increased pain with movements. Pt reporting sharp pains in her bicep area throughout treatment. Pt was allowed to rest and  her pain eased. Pt was edu in imporatnce of shoulder movement/mobility. Pt was issued table slides and isometrics as her HEP.  Discussed follow up with her MD for further testing, possibly MRI due to lack of bicep activation and pt's inability to lift her arm without assistance. Continue with skilled PT to progress toward goals set.     Rehab Potential  Fair    PT Frequency  2x / week    PT Duration  8 weeks    PT Treatment/Interventions  ADLs/Self Care Home Management;Cryotherapy;Electrical Stimulation;Ultrasound;Moist Heat;Functional mobility training;Therapeutic activities;Therapeutic exercise;Neuromuscular re-education;Patient/family education;Passive range of motion;Vasopneumatic Device;Manual techniques    PT Next Visit Plan  Continue to monitor symptoms in RUE, progress as tolerated, pt may need further follow up with MD    Consulted and Agree with Plan of Care  Patient       Patient will benefit from skilled therapeutic intervention in order to improve the following deficits and impairments:  Pain, Decreased strength, Impaired UE functional use, Decreased range of motion, Postural dysfunction  Visit Diagnosis: Chronic right shoulder pain  Stiffness of right shoulder, not elsewhere classified  Muscle weakness (generalized)  Chronic right-sided low back pain without sciatica  Abnormal posture     Problem List Patient Active Problem List   Diagnosis Date Noted  . S/P shoulder replacement, right 08/22/2017  . HTN (hypertension) 02/19/2016  . Post-nasal drip 05/15/2015  . Postoperative abdominal pain 12/07/2014  . Voiding dysfunction 10/07/2014  . SUI (stress urinary incontinence, female) 10/07/2014  . Osteoporosis with fracture 04/06/2014  . Impacted cerumen of both ears 08/20/2013  . Encounter for therapeutic drug monitoring 08/20/2013  . DOE (dyspnea on exertion) 07/23/2013  . Helicobacter positive gastritis 06/21/2013  . Orthostatic hypotension 06/10/2013  . UTI (lower  urinary tract infection) 05/06/2013  . Neck mass 02/19/2013  . Neck pain, bilateral 02/19/2013  . Right groin mass 11/08/2011  . Low back pain 11/08/2011  . Lipoma of neck 06/21/2011  . Memory loss 06/21/2011  . OVERACTIVE BLADDER 08/21/2010  . Long term current use of anticoagulant 07/25/2010  . Osteoarthritis 01/18/2010  . KNEE PAIN 01/18/2010  . FOOT PAIN 01/18/2010  . Hyperlipidemia with target LDL less than 100 04/20/2009  . GERD 04/17/2009  . MITRAL REGURGITATION, MILD 08/30/2008  . NICM (nonischemic cardiomyopathy) ([place]) 08/30/2008  . Congestive heart failure ([place]) 04/13/2007  . DEPRESSION 04/10/2007  . DISEASE, MITRAL VALVE NEC/NOS 04/10/2007  . Atrial fibrillation ([place]) 04/10/2007  . STASIS ULCER 04/10/2007  . VENOUS INSUFFICIENCY, LEGS 04/10/2007    Oretha Caprice, MPT 12/31/2017, 11:00 AM  [facility] [address] [place], [place], [address] Phone: [phone]   Fax:  [phone]  Name: Virginia Young MRN: [id number] Date of Birth: 1940/07/19

## 2018-01-02 ENCOUNTER — Encounter: Payer: Self-pay | Admitting: Physical Therapy

## 2018-01-02 ENCOUNTER — Ambulatory Visit: Payer: Medicare Other | Admitting: Physical Therapy

## 2018-01-02 DIAGNOSIS — M25611 Stiffness of right shoulder, not elsewhere classified: Secondary | ICD-10-CM | POA: Diagnosis not present

## 2018-01-02 DIAGNOSIS — M545 Low back pain: Secondary | ICD-10-CM | POA: Diagnosis not present

## 2018-01-02 DIAGNOSIS — M6281 Muscle weakness (generalized): Secondary | ICD-10-CM | POA: Diagnosis not present

## 2018-01-02 DIAGNOSIS — G8929 Other chronic pain: Secondary | ICD-10-CM

## 2018-01-02 DIAGNOSIS — R293 Abnormal posture: Secondary | ICD-10-CM | POA: Diagnosis not present

## 2018-01-02 DIAGNOSIS — M25511 Pain in right shoulder: Principal | ICD-10-CM

## 2018-01-02 NOTE — Therapy (Signed)
[facility] [place], [place], [address] Phone: [phone]   Fax:  [phone]  Physical Therapy Treatment  Patient Details  Name: Virginia Young MRN: [id number] Date of Birth: Jun 29, 1940 Referring Provider: Esmond Plants MD.   Encounter Date: 01/02/2018  PT End of Session - 01/02/18 1042    Visit Number  5    Number of Visits  12    Date for PT Re-Evaluation  01/19/18    Authorization Type  10TH VISIT PROGRESS NOTE AND KX MODIFIER AFTER THE 15 VISIT.    PT Start Time  [phone]    PT Stop Time  1035    PT Time Calculation (min)  47 min    Activity Tolerance  Patient tolerated treatment well    Behavior During Therapy  WFL for tasks assessed/performed       Past Medical History:  Diagnosis Date  . Allergy   . Atrial fibrillation (HCC)    a. coumadin;  b. Amiodarone  . Cataract   . Chest pain 03/2012   a. Lex MV 10/13:  EF 64%, dist ant and apical defect sugg of soft tissue atten, no ischemia  . Chronic systolic heart failure ([place])   . Depression   . Dysrhythmia   . GERD (gastroesophageal reflux disease)   . HLD (hyperlipidemia)   . Incontinence of urine   . Mild mitral regurgitation   . MS (multiple sclerosis) ([place])   . NICM (nonischemic cardiomyopathy) ([place])    a. neg CLite in 2003;  b. EF 40-45% in past;   c.  Echo 12/11: EF 35-40%, mild MR, mild LAE, mild RAE, small pericardial effusion   . Osteoarthritis   . Osteoporosis   . Stasis ulcer ([place])   . Uterine prolapse   . Vaginal prolapse without uterine prolapse   . Varicose veins     Past Surgical History:  Procedure Laterality Date  . ANTERIOR AND POSTERIOR VAGINAL REPAIR W/ SACROSPINOUS LIGAMENT SUSPENSION  2016   [facility]  . BLADDER SURGERY    . CARPAL TUNNEL RELEASE Right 08/22/2017   Procedure: CARPAL TUNNEL RELEASE;  Surgeon: [name], MD;  Location: [facility];  Service: Orthopedics;  Laterality: Right;  . cataract surgery Bilateral   . EYE SURGERY     . INGUINAL HERNIA REPAIR     right  . LIPOMA EXCISION     h/o removed from upper back x 2  . REVERSE SHOULDER ARTHROPLASTY Right 08/22/2017   Procedure: REVERSE SHOULDER ARTHROPLASTY;  Surgeon: [name], MD;  Location: [facility];  Service: Orthopedics;  Laterality: Right;    There were no vitals filed for this visit.  Subjective Assessment - 01/02/18 0955    Subjective  Reports that she feels like her shoulder is a little better.  (Pended)     Pertinent History  MS; OP.  (Pended)     Limitations  Walking  (Pended)     How long can you stand comfortably?  <10 minutes.  (Pended)     How long can you walk comfortably?  Short distances.  (Pended)     Diagnostic tests  NCV test to right UE.  X-rays  (Pended)     Patient Stated Goals  Move left shoulder with less pain.  (Pended)          [facility] PT Assessment - 01/02/18 0001      Assessment   Medical Diagnosis  S/p right reverse total shoulder.    Onset Date/Surgical Date  08/22/17                   [facility] Adult PT Treatment/Exercise - 01/02/18 0001      Shoulder Exercises: Supine   External Rotation  AAROM;Right;20 reps      Shoulder Exercises: Seated   Other Seated Exercises  Seated UE ranger circles and flexion x5 min      Shoulder Exercises: Standing   Other Standing Exercises  Wall ladder x5 min      Shoulder Exercises: Pulleys   Flexion  Limitations Attempted but unable secondary to pain      Modalities   Modalities  Electrical Stimulation;Moist Heat      Moist Heat Therapy   Number Minutes Moist Heat  15 Minutes    Moist Heat Location  Shoulder      Electrical Stimulation   Electrical Stimulation Location  R shoulder    Electrical Stimulation Action  Pre-Mod    Electrical Stimulation Parameters  80-150 hz x15 min    Electrical Stimulation Goals  Pain      Manual Therapy   Manual Therapy  Passive ROM    Passive ROM  PROM of R shoulder into flexion, ER with gentle holds at end range                PT Short Term Goals - 12/22/17 1428      PT SHORT TERM GOAL #1   Title  STG's=LTG's.        PT Long Term Goals - 12/31/17 0959      PT LONG TERM GOAL #1   Title  Independent with a HEP.    Baseline  instructed in table slides, isometrics 4 way    Time  4    Period  Weeks    Status  On-going      PT LONG TERM GOAL #2   Title  Active right shoulder flexion to 145 degrees so the patient can easily reach overhead    Time  4    Period  Weeks    Status  New      PT LONG TERM GOAL #3   Title  Active ER to 70 degrees+ to allow for easily donning/doffing of apparel    Time  4    Period  Weeks    Status  New      PT LONG TERM GOAL #4   Title  Increase right shoulder and right LE strength to a solid 4/5 to increase stability for performance of functional activities    Time  4    Period  Weeks    Status  New      PT LONG TERM GOAL #5   Title  Perform ADL's with right shoulder pain not > 3/10.    Time  4    Period  Weeks    Status  New            Plan - 01/02/18 1057    Clinical Impression Statement  Patient still limited with ROM and pain around R bicep area throughout treatment. Patient encouraged to complete exercises slowly to reduce pain in R bicep area. Antigravity exercises very limited by lack of strength and pain. Frequent oscillations utilized throughout R shoulder PROM to reduce pain which was present more upon eccentric shoulder flexion. Normal modalities response noted following removal of the modalities.    Rehab Potential  Fair    PT Frequency  2x / week    PT Duration  8 weeks    PT Treatment/Interventions  ADLs/Self Care Home Management;Cryotherapy;Electrical Stimulation;Ultrasound;Moist Heat;Functional mobility training;Therapeutic activities;Therapeutic exercise;Neuromuscular re-education;Patient/family education;Passive range of motion;Vasopneumatic Device;Manual techniques    PT Next Visit Plan  Continue to monitor symptoms in RUE,  progress as tolerated, pt may need further follow up with MD    Consulted and Agree with Plan of Care  Patient       Patient will benefit from skilled therapeutic intervention in order to improve the following deficits and impairments:  Pain, Decreased strength, Impaired UE functional use, Decreased range of motion, Postural dysfunction  Visit Diagnosis: Chronic right shoulder pain  Stiffness of right shoulder, not elsewhere classified  Muscle weakness (generalized)     Problem List Patient Active Problem List   Diagnosis Date Noted  . S/P shoulder replacement, right 08/22/2017  . HTN (hypertension) 02/19/2016  . Post-nasal drip 05/15/2015  . Postoperative abdominal pain 12/07/2014  . Voiding dysfunction 10/07/2014  . SUI (stress urinary incontinence, female) 10/07/2014  . Osteoporosis with fracture 04/06/2014  . Impacted cerumen of both ears 08/20/2013  . Encounter for therapeutic drug monitoring 08/20/2013  . DOE (dyspnea on exertion) 07/23/2013  . Helicobacter positive gastritis 06/21/2013  . Orthostatic hypotension 06/10/2013  . UTI (lower urinary tract infection) 05/06/2013  . Neck mass 02/19/2013  . Neck pain, bilateral 02/19/2013  . Right groin mass 11/08/2011  . Low back pain 11/08/2011  . Lipoma of neck 06/21/2011  . Memory loss 06/21/2011  . OVERACTIVE BLADDER 08/21/2010  . Long term current use of anticoagulant 07/25/2010  . Osteoarthritis 01/18/2010  . KNEE PAIN 01/18/2010  . FOOT PAIN 01/18/2010  . Hyperlipidemia with target LDL less than 100 04/20/2009  . GERD 04/17/2009  . MITRAL REGURGITATION, MILD 08/30/2008  . NICM (nonischemic cardiomyopathy) ([place]) 08/30/2008  . Congestive heart failure ([place]) 04/13/2007  . DEPRESSION 04/10/2007  . DISEASE, MITRAL VALVE NEC/NOS 04/10/2007  . Atrial fibrillation ([place]) 04/10/2007  . STASIS ULCER 04/10/2007  . VENOUS INSUFFICIENCY, LEGS 04/10/2007    Standley Brooking, PTA 01/02/2018, 11:10 AM  [facility] [address] [place], [place], [address] Phone: [phone]   Fax:  [phone]  Name: CHRISA HASSAN MRN: [id number] Date of Birth: 07/09/1940

## 2018-01-05 ENCOUNTER — Ambulatory Visit: Payer: Medicare Other | Admitting: Physical Therapy

## 2018-01-05 DIAGNOSIS — M25611 Stiffness of right shoulder, not elsewhere classified: Secondary | ICD-10-CM | POA: Diagnosis not present

## 2018-01-05 DIAGNOSIS — G8929 Other chronic pain: Secondary | ICD-10-CM | POA: Diagnosis not present

## 2018-01-05 DIAGNOSIS — M545 Low back pain: Secondary | ICD-10-CM | POA: Diagnosis not present

## 2018-01-05 DIAGNOSIS — M6281 Muscle weakness (generalized): Secondary | ICD-10-CM

## 2018-01-05 DIAGNOSIS — R293 Abnormal posture: Secondary | ICD-10-CM | POA: Diagnosis not present

## 2018-01-05 DIAGNOSIS — M25511 Pain in right shoulder: Principal | ICD-10-CM

## 2018-01-05 NOTE — Therapy (Addendum)
[facility] [place], [place], [address] Phone: [phone]   Fax:  [phone]  Physical Therapy Treatment  Patient Details  Name: Virginia Young MRN: [id number] Date of Birth: 10-03-1940 Referring Provider: Esmond Plants MD.   Encounter Date: 01/05/2018  PT End of Session - 01/05/18 0958    Visit Number  6    Number of Visits  12    Date for PT Re-Evaluation  01/19/18    Authorization Type  10TH VISIT PROGRESS NOTE AND KX MODIFIER AFTER THE 15 VISIT.    PT Start Time  [id number]    PT Stop Time  1043    PT Time Calculation (min)  57 min    Activity Tolerance  Patient limited by pain    Behavior During Therapy  [facility] for tasks assessed/performed       Past Medical History:  Diagnosis Date  . Allergy   . Atrial fibrillation (HCC)    a. coumadin;  b. Amiodarone  . Cataract   . Chest pain 03/2012   a. Lex MV 10/13:  EF 64%, dist ant and apical defect sugg of soft tissue atten, no ischemia  . Chronic systolic heart failure ([place])   . Depression   . Dysrhythmia   . GERD (gastroesophageal reflux disease)   . HLD (hyperlipidemia)   . Incontinence of urine   . Mild mitral regurgitation   . MS (multiple sclerosis) ([place])   . NICM (nonischemic cardiomyopathy) ([place])    a. neg CLite in 2003;  b. EF 40-45% in past;   c.  Echo 12/11: EF 35-40%, mild MR, mild LAE, mild RAE, small pericardial effusion   . Osteoarthritis   . Osteoporosis   . Stasis ulcer ([place])   . Uterine prolapse   . Vaginal prolapse without uterine prolapse   . Varicose veins     Past Surgical History:  Procedure Laterality Date  . ANTERIOR AND POSTERIOR VAGINAL REPAIR W/ SACROSPINOUS LIGAMENT SUSPENSION  2016   [facility]  . BLADDER SURGERY    . CARPAL TUNNEL RELEASE Right 08/22/2017   Procedure: CARPAL TUNNEL RELEASE;  Surgeon: [name], MD;  Location: [facility];  Service: Orthopedics;  Laterality: Right;  . cataract surgery Bilateral   . EYE SURGERY    .  INGUINAL HERNIA REPAIR     right  . LIPOMA EXCISION     h/o removed from upper back x 2  . REVERSE SHOULDER ARTHROPLASTY Right 08/22/2017   Procedure: REVERSE SHOULDER ARTHROPLASTY;  Surgeon: [name], MD;  Location: [facility];  Service: Orthopedics;  Laterality: Right;    There were no vitals filed for this visit.  Subjective Assessment - 01/05/18 0948    Subjective  Patient reported she still cannot use her arm secondary to sharp pain and stiffness.     Pertinent History  MS; OP.    Limitations  Walking    How long can you stand comfortably?  <10 minutes.    Diagnostic tests  NCV test to right UE.  X-rays    Patient Stated Goals  Move left shoulder with less pain.    Currently in Pain?  Yes    Pain Score  4     Pain Location  Shoulder    Pain Orientation  Right    Pain Descriptors / Indicators  Sharp;Aching    Pain Type  Surgical pain    Pain Onset  More than a month ago    Pain Frequency  Intermittent  [facility] PT Assessment - 01/05/18 0001      Assessment   Medical Diagnosis  S/p right reverse total shoulder.    Onset Date/Surgical Date  08/22/17      AROM   Overall AROM Comments  AAROM right shoulder flexion limited to 45 degrees      PROM   PROM Assessment Site  Shoulder    Right/Left Shoulder  Right Pain with all PROM    Right Shoulder Flexion  107 Degrees    Right Shoulder External Rotation  39 Degrees                   [facility] Adult PT Treatment/Exercise - 01/05/18 0001      Exercises   Exercises  Shoulder      Shoulder Exercises: Seated   Other Seated Exercises  Seated UE ranger circles and flexion x3 min attempted, pain plus compensation. terminated at 3 minutes      Modalities   Modalities  Electrical Stimulation;Moist Heat      Moist Heat Therapy   Number Minutes Moist Heat  15 Minutes    Moist Heat Location  Shoulder      Electrical Stimulation   Electrical Stimulation Location  R shoulder    Electrical Stimulation Action   Pre-mod    Electrical Stimulation Parameters  80-150 hz x15 min    Electrical Stimulation Goals  Pain      Manual Therapy   Manual Therapy  Passive ROM    Soft tissue mobilization  STW to R bicep  to reduce pain and muscle tightness    Passive ROM  PROM of R shoulder into flexion, ER with gentle holds at end range               PT Short Term Goals - 12/22/17 1428      PT SHORT TERM GOAL #1   Title  STG's=LTG's.        PT Long Term Goals - 01/05/18 1222      PT LONG TERM GOAL #1   Title  Independent with a HEP.    Baseline  instructed in table slides, isometrics 4 way    Time  4    Period  Weeks    Status  On-going      PT LONG TERM GOAL #2   Title  Active right shoulder flexion to 145 degrees so the patient can easily reach overhead    Time  4    Period  Weeks    Status  On-going      PT LONG TERM GOAL #3   Title  Active ER to 70 degrees+ to allow for easily donning/doffing of apparel    Time  4    Period  Weeks    Status  On-going      PT LONG TERM GOAL #4   Title  Increase right shoulder and right LE strength to a solid 4/5 to increase stability for performance of functional activities    Time  4    Period  Weeks    Status  On-going      PT LONG TERM GOAL #5   Title  Perform ADL's with right shoulder pain not > 3/10.    Time  4    Period  Weeks    Status  On-going      PT LONG TERM GOAL #6   Title  ---      PT LONG TERM GOAL #7   Title  ---  Plan - 01/05/18 1213    Clinical Impression Statement  Patient still very limited with all ROM with associated pain to R biceps, R flank and chest area. Patient attempted seated ranger AAROM however patient noted with shoulder elevation compensations with reports of sharp pains. Exercise terminated after 3 minutes. Patient required oscillations throughout PROM to prevent muscle guarding. Patient noted with increased sharp pain especially with descent from shoulder flexion. Patient and PT  discussed making a follow up appointment to see surgeon, Dr. Veverly Fells as patient has not made any progress with ROM or pain. Patient and PT discussed patient may need other diagnostic testings to determine source of pain and weakness. Patient reported she will call surgeon for follow up. No adverse affects noted upon removal of modalities. Patient inquired whether PT can write a prescription for pain cream; patient educated physical therapists cannot prescribe any medication and should follow up with surgeon for medications. Patient reported understanding.    Rehab Potential  Fair    PT Frequency  2x / week    PT Duration  8 weeks    PT Treatment/Interventions  ADLs/Self Care Home Management;Cryotherapy;Electrical Stimulation;Ultrasound;Moist Heat;Functional mobility training;Therapeutic activities;Therapeutic exercise;Neuromuscular re-education;Patient/family education;Passive range of motion;Vasopneumatic Device;Manual techniques    PT Next Visit Plan  Continue to monitor symptoms in RUE, very gentle PROM, progress as tolerated, pt may need further follow up with MD    Consulted and Agree with Plan of Care  Patient       Patient will benefit from skilled therapeutic intervention in order to improve the following deficits and impairments:  Pain, Decreased strength, Impaired UE functional use, Decreased range of motion, Postural dysfunction  Visit Diagnosis: Chronic right shoulder pain  Stiffness of right shoulder, not elsewhere classified  Muscle weakness (generalized)     Problem List Patient Active Problem List   Diagnosis Date Noted  . S/P shoulder replacement, right 08/22/2017  . HTN (hypertension) 02/19/2016  . Post-nasal drip 05/15/2015  . Postoperative abdominal pain 12/07/2014  . Voiding dysfunction 10/07/2014  . SUI (stress urinary incontinence, female) 10/07/2014  . Osteoporosis with fracture 04/06/2014  . Impacted cerumen of both ears 08/20/2013  . Encounter for therapeutic  drug monitoring 08/20/2013  . DOE (dyspnea on exertion) 07/23/2013  . Helicobacter positive gastritis 06/21/2013  . Orthostatic hypotension 06/10/2013  . UTI (lower urinary tract infection) 05/06/2013  . Neck mass 02/19/2013  . Neck pain, bilateral 02/19/2013  . Right groin mass 11/08/2011  . Low back pain 11/08/2011  . Lipoma of neck 06/21/2011  . Memory loss 06/21/2011  . OVERACTIVE BLADDER 08/21/2010  . Long term current use of anticoagulant 07/25/2010  . Osteoarthritis 01/18/2010  . KNEE PAIN 01/18/2010  . FOOT PAIN 01/18/2010  . Hyperlipidemia with target LDL less than 100 04/20/2009  . GERD 04/17/2009  . MITRAL REGURGITATION, MILD 08/30/2008  . NICM (nonischemic cardiomyopathy) ([place]) 08/30/2008  . Congestive heart failure ([place]) 04/13/2007  . DEPRESSION 04/10/2007  . DISEASE, MITRAL VALVE NEC/NOS 04/10/2007  . Atrial fibrillation ([place]) 04/10/2007  . STASIS ULCER 04/10/2007  . VENOUS INSUFFICIENCY, LEGS 04/10/2007   Gabriela Eves, PT, DPT 01/05/2018, 12:33 PM  [facility] [address] [place], [place], [address] Phone: [phone]   Fax:  [phone]  Name: Virginia Young MRN: [id number] Date of Birth: [birth year]

## 2018-01-06 DIAGNOSIS — M19011 Primary osteoarthritis, right shoulder: Secondary | ICD-10-CM | POA: Diagnosis not present

## 2018-01-07 ENCOUNTER — Encounter: Payer: Self-pay | Admitting: Physical Therapy

## 2018-01-07 ENCOUNTER — Ambulatory Visit: Payer: Medicare Other | Admitting: Physical Therapy

## 2018-01-07 DIAGNOSIS — G8929 Other chronic pain: Secondary | ICD-10-CM | POA: Diagnosis not present

## 2018-01-07 DIAGNOSIS — M6281 Muscle weakness (generalized): Secondary | ICD-10-CM | POA: Diagnosis not present

## 2018-01-07 DIAGNOSIS — M25511 Pain in right shoulder: Principal | ICD-10-CM

## 2018-01-07 DIAGNOSIS — M25611 Stiffness of right shoulder, not elsewhere classified: Secondary | ICD-10-CM | POA: Diagnosis not present

## 2018-01-07 DIAGNOSIS — R293 Abnormal posture: Secondary | ICD-10-CM | POA: Diagnosis not present

## 2018-01-07 DIAGNOSIS — M545 Low back pain: Secondary | ICD-10-CM | POA: Diagnosis not present

## 2018-01-07 NOTE — Therapy (Signed)
[facility] [place], [place], [address] Phone: [phone]   Fax:  [phone]  Physical Therapy Treatment  Patient Details  Name: Virginia Young MRN: [id number] Date of Birth: [birth year] Referring Provider: Esmond Plants MD.   Encounter Date: 01/07/2018  PT End of Session - 01/07/18 1028    Visit Number  7    Number of Visits  12    Date for PT Re-Evaluation  01/19/18    Authorization Type  10TH VISIT PROGRESS NOTE AND KX MODIFIER AFTER THE 15 VISIT.    PT Start Time  [phone]    PT Stop Time  1043    PT Time Calculation (min)  57 min    Activity Tolerance  Patient tolerated treatment well;Patient limited by pain    Behavior During Therapy  [facility] for tasks assessed/performed       Past Medical History:  Diagnosis Date  . Allergy   . Atrial fibrillation (HCC)    a. coumadin;  b. Amiodarone  . Cataract   . Chest pain 03/2012   a. Lex MV 10/13:  EF 64%, dist ant and apical defect sugg of soft tissue atten, no ischemia  . Chronic systolic heart failure ([place])   . Depression   . Dysrhythmia   . GERD (gastroesophageal reflux disease)   . HLD (hyperlipidemia)   . Incontinence of urine   . Mild mitral regurgitation   . MS (multiple sclerosis) ([place])   . NICM (nonischemic cardiomyopathy) ([place])    a. neg CLite in 2003;  b. EF 40-45% in past;   c.  Echo 12/11: EF 35-40%, mild MR, mild LAE, mild RAE, small pericardial effusion   . Osteoarthritis   . Osteoporosis   . Stasis ulcer ([place])   . Uterine prolapse   . Vaginal prolapse without uterine prolapse   . Varicose veins     Past Surgical History:  Procedure Laterality Date  . ANTERIOR AND POSTERIOR VAGINAL REPAIR W/ SACROSPINOUS LIGAMENT SUSPENSION  2016   [facility]  . BLADDER SURGERY    . CARPAL TUNNEL RELEASE Right 08/22/2017   Procedure: CARPAL TUNNEL RELEASE;  Surgeon: [name], MD;  Location: [facility];  Service: Orthopedics;  Laterality: Right;  . cataract surgery  Bilateral   . EYE SURGERY    . INGUINAL HERNIA REPAIR     right  . LIPOMA EXCISION     h/o removed from upper back x 2  . REVERSE SHOULDER ARTHROPLASTY Right 08/22/2017   Procedure: REVERSE SHOULDER ARTHROPLASTY;  Surgeon: [name], MD;  Location: [facility];  Service: Orthopedics;  Laterality: Right;    There were no vitals filed for this visit.  Subjective Assessment - 01/07/18 0952    Subjective  Patient reported she still cannot use her arm secondary to sharp pain and stiffness. /Patient we nt to MD yesterday and said her arm was healing good and she needed to use pulleys at home and to use Lt UE to move or Rt AAROM    Pertinent History  MS; OP.    Limitations  Walking    How long can you stand comfortably?  <10 minutes.    How long can you walk comfortably?  Short distances.    Diagnostic tests  NCV test to right UE.  X-rays    Patient Stated Goals  Move left shoulder with less pain.    Currently in Pain?  Yes    Pain Score  6     Pain Location  Shoulder    Pain Orientation  Right    Pain Descriptors / Indicators  Sharp    Pain Type  Surgical pain    Pain Onset  More than a month ago    Pain Frequency  Intermittent    Aggravating Factors   any movement overhead    Pain Relieving Factors  rest         [facility] PT Assessment - 01/07/18 0001      PROM   PROM Assessment Site  Shoulder    Right/Left Shoulder  Right    Right Shoulder Flexion  124 Degrees    Right Shoulder External Rotation  56 Degrees                   [facility] Adult PT Treatment/Exercise - 01/07/18 0001      Shoulder Exercises: Supine   Other Supine Exercises  supine cane/hands close/elbows bent x10    Other Supine Exercises  supine AAROM with LT UE for flexion x10      Shoulder Exercises: Seated   Other Seated Exercises  seated for table slide forward 2x10 and side ER/abd 2x10/w slide board      Moist Heat Therapy   Number Minutes Moist Heat  15 Minutes    Moist Heat Location  Shoulder       Electrical Stimulation   Electrical Stimulation Location  R shoulder    Electrical Stimulation Action  premod    Electrical Stimulation Parameters  80-150hz  x66min    Electrical Stimulation Goals  Pain      Manual Therapy   Manual Therapy  Passive ROM    Manual therapy comments  gentle ER/IR rhythmic stabilization in scaption    Passive ROM  PROM of R shoulder into flexion, ER with gentle holds at end range             PT Education - 01/07/18 1033    Education Details  HEP    Person(s) Educated  Patient    Methods  Explanation;Demonstration;Handout    Comprehension  Verbalized understanding;Returned demonstration       PT Short Term Goals - 12/22/17 1428      PT SHORT TERM GOAL #1   Title  STG's=LTG's.        PT Long Term Goals - 01/05/18 1222      PT LONG TERM GOAL #1   Title  Independent with a HEP.    Baseline  instructed in table slides, isometrics 4 way    Time  4    Period  Weeks    Status  On-going      PT LONG TERM GOAL #2   Title  Active right shoulder flexion to 145 degrees so the patient can easily reach overhead    Time  4    Period  Weeks    Status  On-going      PT LONG TERM GOAL #3   Title  Active ER to 70 degrees+ to allow for easily donning/doffing of apparel    Time  4    Period  Weeks    Status  On-going      PT LONG TERM GOAL #4   Title  Increase right shoulder and right LE strength to a solid 4/5 to increase stability for performance of functional activities    Time  4    Period  Weeks    Status  On-going      PT LONG TERM GOAL #5   Title  Perform ADL's with right shoulder pain not > 3/10.    Time  4    Period  Weeks    Status  On-going      PT LONG TERM GOAL #6   Title  ---      PT LONG TERM GOAL #7   Title  ---            Plan - 01/07/18 1033    Clinical Impression Statement  Patient able to complete all exercises today with some ongoing sharp pains. HEP given for supine AAROM exercise, educated patient on use  of shoulder and doing HEP daily to improve ROM in right shoulder and functional independence. Patient reported that her doctor did not feel that she needed an x-ray or MRI of shoulder that her arm was healing and there was no way she could have injured shoulder. Patient was encouraged to do pulley's and AAROM for flexion per MD. Encouraged patient to be diligent with HEP and using shoulder as instructed by MD. Patient improved ROM today. Goals ongoing.     Rehab Potential  Fair    PT Frequency  2x / week    PT Duration  8 weeks    PT Treatment/Interventions  ADLs/Self Care Home Management;Cryotherapy;Electrical Stimulation;Ultrasound;Moist Heat;Functional mobility training;Therapeutic activities;Therapeutic exercise;Neuromuscular re-education;Patient/family education;Passive range of motion;Vasopneumatic Device;Manual techniques    PT Next Visit Plan  Cont with POC for AAROM, pulley's, UE ranger/table slides or ball on table/P/AAROM for right shoulder and modalities PRN and Please monitor patient symptoms per PT     Consulted and Agree with Plan of Care  Patient       Patient will benefit from skilled therapeutic intervention in order to improve the following deficits and impairments:  Pain, Decreased strength, Impaired UE functional use, Decreased range of motion, Postural dysfunction  Visit Diagnosis: Chronic right shoulder pain  Stiffness of right shoulder, not elsewhere classified  Muscle weakness (generalized)     Problem List Patient Active Problem List   Diagnosis Date Noted  . S/P shoulder replacement, right 08/22/2017  . HTN (hypertension) 02/19/2016  . Post-nasal drip 05/15/2015  . Postoperative abdominal pain 12/07/2014  . Voiding dysfunction 10/07/2014  . SUI (stress urinary incontinence, female) 10/07/2014  . Osteoporosis with fracture 04/06/2014  . Impacted cerumen of both ears 08/20/2013  . Encounter for therapeutic drug monitoring 08/20/2013  . DOE (dyspnea on  exertion) 07/23/2013  . Helicobacter positive gastritis 06/21/2013  . Orthostatic hypotension 06/10/2013  . UTI (lower urinary tract infection) 05/06/2013  . Neck mass 02/19/2013  . Neck pain, bilateral 02/19/2013  . Right groin mass 11/08/2011  . Low back pain 11/08/2011  . Lipoma of neck 06/21/2011  . Memory loss 06/21/2011  . OVERACTIVE BLADDER 08/21/2010  . Long term current use of anticoagulant 07/25/2010  . Osteoarthritis 01/18/2010  . KNEE PAIN 01/18/2010  . FOOT PAIN 01/18/2010  . Hyperlipidemia with target LDL less than 100 04/20/2009  . GERD 04/17/2009  . MITRAL REGURGITATION, MILD 08/30/2008  . NICM (nonischemic cardiomyopathy) ([place]) 08/30/2008  . Congestive heart failure ([place]) 04/13/2007  . DEPRESSION 04/10/2007  . DISEASE, MITRAL VALVE NEC/NOS 04/10/2007  . Atrial fibrillation ([place]) 04/10/2007  . STASIS ULCER 04/10/2007  . VENOUS INSUFFICIENCY, LEGS 04/10/2007    Arti Trang P, PTA 01/07/2018, 10:53 AM  [facility] [place], [place], [address] Phone: [phone]   Fax:  [phone]  Name: Virginia Young MRN: [id number] Date of Birth: 07-13-1940

## 2018-01-07 NOTE — Patient Instructions (Signed)
     Lie IN BED on back holding wand. Raise arms over head. MAY KEEP HANDS CLOSE Repeat __10__ times per set. Do _2__ sets per session. Do _3___ sessions per day.      IN BED lying on your back with your arm at your side, grasp your affected arm and slowly raise it up upwards and towards overhead.   Your affected arm should be relaxed and your other arm performing the work. 3 times a day

## 2018-01-09 ENCOUNTER — Ambulatory Visit: Payer: Medicare Other | Admitting: *Deleted

## 2018-01-09 DIAGNOSIS — M25511 Pain in right shoulder: Secondary | ICD-10-CM | POA: Diagnosis not present

## 2018-01-09 DIAGNOSIS — G8929 Other chronic pain: Secondary | ICD-10-CM | POA: Diagnosis not present

## 2018-01-09 DIAGNOSIS — M6281 Muscle weakness (generalized): Secondary | ICD-10-CM

## 2018-01-09 DIAGNOSIS — R293 Abnormal posture: Secondary | ICD-10-CM | POA: Diagnosis not present

## 2018-01-09 DIAGNOSIS — M25611 Stiffness of right shoulder, not elsewhere classified: Secondary | ICD-10-CM | POA: Diagnosis not present

## 2018-01-09 DIAGNOSIS — M545 Low back pain: Secondary | ICD-10-CM | POA: Diagnosis not present

## 2018-01-09 NOTE — Therapy (Signed)
[facility] [place], [place], [address] Phone: [phone]   Fax:  [phone]  Physical Therapy Treatment  Patient Details  Name: Virginia Young MRN: [id number] Date of Birth: June 05, 1941 Referring Provider: Esmond Plants MD.   Encounter Date: 01/09/2018  PT End of Session - 01/09/18 1103    Visit Number  8    Number of Visits  12    Date for PT Re-Evaluation  01/19/18    Authorization Type  10TH VISIT PROGRESS NOTE AND KX MODIFIER AFTER THE 15 VISIT.    PT Start Time  [phone]    PT Stop Time  1035    PT Time Calculation (min)  49 min       Past Medical History:  Diagnosis Date  . Allergy   . Atrial fibrillation (HCC)    a. coumadin;  b. Amiodarone  . Cataract   . Chest pain 03/2012   a. Lex MV 10/13:  EF 64%, dist ant and apical defect sugg of soft tissue atten, no ischemia  . Chronic systolic heart failure ([place])   . Depression   . Dysrhythmia   . GERD (gastroesophageal reflux disease)   . HLD (hyperlipidemia)   . Incontinence of urine   . Mild mitral regurgitation   . MS (multiple sclerosis) ([place])   . NICM (nonischemic cardiomyopathy) ([facility])    a. neg CLite in 2003;  b. EF 40-45% in past;   c.  Echo 12/11: EF 35-40%, mild MR, mild LAE, mild RAE, small pericardial effusion   . Osteoarthritis   . Osteoporosis   . Stasis ulcer ([place])   . Uterine prolapse   . Vaginal prolapse without uterine prolapse   . Varicose veins     Past Surgical History:  Procedure Laterality Date  . ANTERIOR AND POSTERIOR VAGINAL REPAIR W/ SACROSPINOUS LIGAMENT SUSPENSION  2016   [facility]  . BLADDER SURGERY    . CARPAL TUNNEL RELEASE Right 08/22/2017   Procedure: CARPAL TUNNEL RELEASE;  Surgeon: [name], MD;  Location: [facility];  Service: Orthopedics;  Laterality: Right;  . cataract surgery Bilateral   . EYE SURGERY    . INGUINAL HERNIA REPAIR     right  . LIPOMA EXCISION     h/o removed from upper back x 2  . REVERSE SHOULDER  ARTHROPLASTY Right 08/22/2017   Procedure: REVERSE SHOULDER ARTHROPLASTY;  Surgeon: [name], MD;  Location: [facility];  Service: Orthopedics;  Laterality: Right;    There were no vitals filed for this visit.  Subjective Assessment - 01/09/18 0958    Subjective  Did good after last Rx. RT shldr is feeling better    Pertinent History  MS; OP.    Limitations  Walking    How long can you stand comfortably?  <10 minutes.    How long can you walk comfortably?  Short distances.    Diagnostic tests  NCV test to right UE.  X-rays    Patient Stated Goals  Move left shoulder with less pain.    Currently in Pain?  Yes    Pain Score  4     Pain Location  Shoulder    Pain Orientation  Right    Pain Descriptors / Indicators  Sharp    Pain Type  Surgical pain    Pain Onset  More than a month ago    Pain Frequency  Intermittent  [facility] Adult PT Treatment/Exercise - 01/09/18 0001      Exercises   Exercises  Shoulder      Shoulder Exercises: Supine   Other Supine Exercises  supine cane/hands close/elbows bent x10    Other Supine Exercises  supine AAROM with LT UE for flexion x10      Shoulder Exercises: Pulleys   Flexion  5 minutes    Other Pulley Exercises  sitting UE ranger flexion and circles x 5 mins      Modalities   Modalities  Electrical Stimulation;Moist Heat      Moist Heat Therapy   Number Minutes Moist Heat  15 Minutes    Moist Heat Location  Shoulder      Electrical Stimulation   Electrical Stimulation Location  R shoulder premod x 15 mins 80-150hz     Electrical Stimulation Goals  Pain      Manual Therapy   Manual Therapy  Passive ROM    Manual therapy comments  gentle ER/IR rhythmic stabilization in scaption    Passive ROM  PROM of R shoulder into flexion, ER with gentle holds at end range               PT Short Term Goals - 12/22/17 1428      PT SHORT TERM GOAL #1   Title  STG's=LTG's.        PT Long Term Goals -  01/05/18 1222      PT LONG TERM GOAL #1   Title  Independent with a HEP.    Baseline  instructed in table slides, isometrics 4 way    Time  4    Period  Weeks    Status  On-going      PT LONG TERM GOAL #2   Title  Active right shoulder flexion to 145 degrees so the patient can easily reach overhead    Time  4    Period  Weeks    Status  On-going      PT LONG TERM GOAL #3   Title  Active ER to 70 degrees+ to allow for easily donning/doffing of apparel    Time  4    Period  Weeks    Status  On-going      PT LONG TERM GOAL #4   Title  Increase right shoulder and right LE strength to a solid 4/5 to increase stability for performance of functional activities    Time  4    Period  Weeks    Status  On-going      PT LONG TERM GOAL #5   Title  Perform ADL's with right shoulder pain not > 3/10.    Time  4    Period  Weeks    Status  On-going      PT LONG TERM GOAL #6   Title  ---      PT LONG TERM GOAL #7   Title  ---            Plan - 01/09/18 1058    Clinical Impression Statement  Pt arrived today doing fairly well with decreased pain in RT shldr. HEP was reviewed and pt was advised to keep hands closer to decrease pain with exs.Pt did well with pulleys and UE ranger in sitting with minimal complaints. PROM for flexion was 120 degrees, ER to 45 degrees and IR to 80 degrees.    Rehab Potential  Fair    PT Frequency  2x / week  PT Duration  8 weeks    PT Treatment/Interventions  ADLs/Self Care Home Management;Cryotherapy;Electrical Stimulation;Ultrasound;Moist Heat;Functional mobility training;Therapeutic activities;Therapeutic exercise;Neuromuscular re-education;Patient/family education;Passive range of motion;Vasopneumatic Device;Manual techniques    PT Next Visit Plan  Cont with POC for AAROM, pulley's, UE ranger/table slides or ball on table/P/AAROM for right shoulder and modalities PRN and Please monitor patient symptoms per PT     Consulted and Agree with Plan of  Care  Patient       Patient will benefit from skilled therapeutic intervention in order to improve the following deficits and impairments:  Pain, Decreased strength, Impaired UE functional use, Decreased range of motion, Postural dysfunction  Visit Diagnosis: Chronic right shoulder pain  Stiffness of right shoulder, not elsewhere classified  Muscle weakness (generalized)     Problem List Patient Active Problem List   Diagnosis Date Noted  . S/P shoulder replacement, right 08/22/2017  . HTN (hypertension) 02/19/2016  . Post-nasal drip 05/15/2015  . Postoperative abdominal pain 12/07/2014  . Voiding dysfunction 10/07/2014  . SUI (stress urinary incontinence, female) 10/07/2014  . Osteoporosis with fracture 04/06/2014  . Impacted cerumen of both ears 08/20/2013  . Encounter for therapeutic drug monitoring 08/20/2013  . DOE (dyspnea on exertion) 07/23/2013  . Helicobacter positive gastritis 06/21/2013  . Orthostatic hypotension 06/10/2013  . UTI (lower urinary tract infection) 05/06/2013  . Neck mass 02/19/2013  . Neck pain, bilateral 02/19/2013  . Right groin mass 11/08/2011  . Low back pain 11/08/2011  . Lipoma of neck 06/21/2011  . Memory loss 06/21/2011  . OVERACTIVE BLADDER 08/21/2010  . Long term current use of anticoagulant 07/25/2010  . Osteoarthritis 01/18/2010  . KNEE PAIN 01/18/2010  . FOOT PAIN 01/18/2010  . Hyperlipidemia with target LDL less than 100 04/20/2009  . GERD 04/17/2009  . MITRAL REGURGITATION, MILD 08/30/2008  . NICM (nonischemic cardiomyopathy) ([place]) 08/30/2008  . Congestive heart failure ([place]) 04/13/2007  . DEPRESSION 04/10/2007  . DISEASE, MITRAL VALVE NEC/NOS 04/10/2007  . Atrial fibrillation ([place]) 04/10/2007  . STASIS ULCER 04/10/2007  . VENOUS INSUFFICIENCY, LEGS 04/10/2007    Aleene Swanner,CHRIS, PTA 01/09/2018, 11:04 AM  [facility] [place], [place], [address] Phone: [phone]    Fax:  [phone]  Name: Virginia Young MRN: [id number] Date of Birth: [birth year]

## 2018-01-12 ENCOUNTER — Ambulatory Visit: Payer: Medicare Other | Admitting: Physical Therapy

## 2018-01-12 ENCOUNTER — Encounter: Payer: Self-pay | Admitting: Physical Therapy

## 2018-01-12 DIAGNOSIS — G8929 Other chronic pain: Secondary | ICD-10-CM | POA: Diagnosis not present

## 2018-01-12 DIAGNOSIS — M6281 Muscle weakness (generalized): Secondary | ICD-10-CM

## 2018-01-12 DIAGNOSIS — M25611 Stiffness of right shoulder, not elsewhere classified: Secondary | ICD-10-CM

## 2018-01-12 DIAGNOSIS — M545 Low back pain: Secondary | ICD-10-CM | POA: Diagnosis not present

## 2018-01-12 DIAGNOSIS — M25511 Pain in right shoulder: Secondary | ICD-10-CM | POA: Diagnosis not present

## 2018-01-12 DIAGNOSIS — R293 Abnormal posture: Secondary | ICD-10-CM | POA: Diagnosis not present

## 2018-01-12 NOTE — Therapy (Signed)
[facility] [place], [place], [address] Phone: [phone]   Fax:  [phone]  Physical Therapy Treatment  Patient Details  Name: Virginia Young MRN: [id number] Date of Birth: 12-23-1940 Referring Provider: Esmond Plants MD.   Encounter Date: 01/12/2018  PT End of Session - 01/12/18 0951    Visit Number  9    Number of Visits  12    Date for PT Re-Evaluation  01/19/18    Authorization Type  10TH VISIT PROGRESS NOTE AND KX MODIFIER AFTER THE 15 VISIT.    PT Start Time  [phone]    PT Stop Time  1031    PT Time Calculation (min)  45 min    Activity Tolerance  Patient tolerated treatment well    Behavior During Therapy  WFL for tasks assessed/performed       Past Medical History:  Diagnosis Date  . Allergy   . Atrial fibrillation (HCC)    a. coumadin;  b. Amiodarone  . Cataract   . Chest pain 03/2012   a. Lex MV 10/13:  EF 64%, dist ant and apical defect sugg of soft tissue atten, no ischemia  . Chronic systolic heart failure ([place])   . Depression   . Dysrhythmia   . GERD (gastroesophageal reflux disease)   . HLD (hyperlipidemia)   . Incontinence of urine   . Mild mitral regurgitation   . MS (multiple sclerosis) ([place])   . NICM (nonischemic cardiomyopathy) ([place])    a. neg CLite in 2003;  b. EF 40-45% in past;   c.  Echo 12/11: EF 35-40%, mild MR, mild LAE, mild RAE, small pericardial effusion   . Osteoarthritis   . Osteoporosis   . Stasis ulcer ([place])   . Uterine prolapse   . Vaginal prolapse without uterine prolapse   . Varicose veins     Past Surgical History:  Procedure Laterality Date  . ANTERIOR AND POSTERIOR VAGINAL REPAIR W/ SACROSPINOUS LIGAMENT SUSPENSION  2016   [facility]  . BLADDER SURGERY    . CARPAL TUNNEL RELEASE Right 08/22/2017   Procedure: CARPAL TUNNEL RELEASE;  Surgeon: [name], MD;  Location: [place];  Service: Orthopedics;  Laterality: Right;  . cataract surgery Bilateral   . EYE SURGERY     . INGUINAL HERNIA REPAIR     right  . LIPOMA EXCISION     h/o removed from upper back x 2  . REVERSE SHOULDER ARTHROPLASTY Right 08/22/2017   Procedure: REVERSE SHOULDER ARTHROPLASTY;  Surgeon: [name], MD;  Location: [place];  Service: Orthopedics;  Laterality: Right;    There were no vitals filed for this visit.  Subjective Assessment - 01/12/18 0950    Subjective  Reports that her shoulder is about the same but maybe a little better.    Pertinent History  MS; OP.    Limitations  Walking    How long can you stand comfortably?  <10 minutes.    How long can you walk comfortably?  Short distances.    Diagnostic tests  NCV test to right UE.  X-rays    Patient Stated Goals  Move left shoulder with less pain.    Currently in Pain?  Yes    Pain Score  3     Pain Location  Shoulder    Pain Orientation  Right    Pain Descriptors / Indicators  Sore    Pain Type  Surgical pain    Pain Onset  More than a  month ago         [facility] PT Assessment - 01/12/18 0001      Assessment   Medical Diagnosis  S/p right reverse total shoulder.    Onset Date/Surgical Date  08/22/17                   [facility] Adult PT Treatment/Exercise - 01/12/18 0001      Shoulder Exercises: Supine   Protraction  AAROM;Both;Other (comment) 3x10 reps    External Rotation  AAROM;Right;Other (comment) 3x10 reps    Flexion  AAROM;Both;Other (comment) 3x10 reps      Shoulder Exercises: Pulleys   Flexion  5 minutes    Other Pulley Exercises  sitting UE ranger flexion and circles x 5 mins      Modalities   Modalities  Electrical Stimulation;Moist Heat      Moist Heat Therapy   Number Minutes Moist Heat  15 Minutes    Moist Heat Location  Shoulder      Electrical Stimulation   Electrical Stimulation Location  R shoulder    Electrical Stimulation Action  IFC    Electrical Stimulation Parameters  80-150 hz x15 min    Electrical Stimulation Goals  Pain      Manual Therapy   Manual Therapy   Passive ROM;Soft tissue mobilization    Soft tissue mobilization  STW to R bicep to reduce muscle tightness     Passive ROM  PROM of R shoulder into flexion, ER, IR with gentle holds at end ranges               PT Short Term Goals - 12/22/17 1428      PT SHORT TERM GOAL #1   Title  STG's=LTG's.        PT Long Term Goals - 01/05/18 1222      PT LONG TERM GOAL #1   Title  Independent with a HEP.    Baseline  instructed in table slides, isometrics 4 way    Time  4    Period  Weeks    Status  On-going      PT LONG TERM GOAL #2   Title  Active right shoulder flexion to 145 degrees so the patient can easily reach overhead    Time  4    Period  Weeks    Status  On-going      PT LONG TERM GOAL #3   Title  Active ER to 70 degrees+ to allow for easily donning/doffing of apparel    Time  4    Period  Weeks    Status  On-going      PT LONG TERM GOAL #4   Title  Increase right shoulder and right LE strength to a solid 4/5 to increase stability for performance of functional activities    Time  4    Period  Weeks    Status  On-going      PT LONG TERM GOAL #5   Title  Perform ADL's with right shoulder pain not > 3/10.    Time  4    Period  Weeks    Status  On-going      PT LONG TERM GOAL #6   Title  ---      PT LONG TERM GOAL #7   Title  ---            Plan - 01/12/18 1029    Clinical Impression Statement  Patient tolerated today's treatment well  with patient able to tolerate all AAROM exercises and able to complete with reduced pain. Only intermittant discomfort reported by patient. Patient able to fully extend R elbow with supine AAROM exercises today. FIrm end feels noted with PROM of R shoulder into flex,ER, IR. STW completed to R bicep to reduce muscle tightness reported by patient.    Rehab Potential  Fair    PT Frequency  2x / week    PT Duration  8 weeks    PT Treatment/Interventions  ADLs/Self Care Home Management;Cryotherapy;Electrical  Stimulation;Ultrasound;Moist Heat;Functional mobility training;Therapeutic activities;Therapeutic exercise;Neuromuscular re-education;Patient/family education;Passive range of motion;Vasopneumatic Device;Manual techniques    PT Next Visit Plan  Cont with POC for AAROM, pulley's, UE ranger/table slides or ball on table/P/AAROM for right shoulder and modalities PRN and Please monitor patient symptoms per PT     Consulted and Agree with Plan of Care  Patient       Patient will benefit from skilled therapeutic intervention in order to improve the following deficits and impairments:  Pain, Decreased strength, Impaired UE functional use, Decreased range of motion, Postural dysfunction  Visit Diagnosis: Chronic right shoulder pain  Stiffness of right shoulder, not elsewhere classified  Muscle weakness (generalized)     Problem List Patient Active Problem List   Diagnosis Date Noted  . S/P shoulder replacement, right 08/22/2017  . HTN (hypertension) 02/19/2016  . Post-nasal drip 05/15/2015  . Postoperative abdominal pain 12/07/2014  . Voiding dysfunction 10/07/2014  . SUI (stress urinary incontinence, female) 10/07/2014  . Osteoporosis with fracture 04/06/2014  . Impacted cerumen of both ears 08/20/2013  . Encounter for therapeutic drug monitoring 08/20/2013  . DOE (dyspnea on exertion) 07/23/2013  . Helicobacter positive gastritis 06/21/2013  . Orthostatic hypotension 06/10/2013  . UTI (lower urinary tract infection) 05/06/2013  . Neck mass 02/19/2013  . Neck pain, bilateral 02/19/2013  . Right groin mass 11/08/2011  . Low back pain 11/08/2011  . Lipoma of neck 06/21/2011  . Memory loss 06/21/2011  . OVERACTIVE BLADDER 08/21/2010  . Long term current use of anticoagulant 07/25/2010  . Osteoarthritis 01/18/2010  . KNEE PAIN 01/18/2010  . FOOT PAIN 01/18/2010  . Hyperlipidemia with target LDL less than 100 04/20/2009  . GERD 04/17/2009  . MITRAL REGURGITATION, MILD 08/30/2008   . NICM (nonischemic cardiomyopathy) ([place]) 08/30/2008  . Congestive heart failure ([place]) 04/13/2007  . DEPRESSION 04/10/2007  . DISEASE, MITRAL VALVE NEC/NOS 04/10/2007  . Atrial fibrillation ([place]) 04/10/2007  . STASIS ULCER 04/10/2007  . VENOUS INSUFFICIENCY, LEGS 04/10/2007    Standley Brooking, PTA 01/12/2018, 10:45 AM  [facility] [address] [place], [place], [address] Phone: [phone]   Fax:  [phone]  Name: TOYA PALACIOS MRN: [id number] Date of Birth: 01/09/1941

## 2018-01-14 ENCOUNTER — Ambulatory Visit: Payer: Medicare Other | Admitting: Physical Therapy

## 2018-01-14 ENCOUNTER — Encounter: Payer: Self-pay | Admitting: Physical Therapy

## 2018-01-14 DIAGNOSIS — M25511 Pain in right shoulder: Secondary | ICD-10-CM | POA: Diagnosis not present

## 2018-01-14 DIAGNOSIS — M6281 Muscle weakness (generalized): Secondary | ICD-10-CM

## 2018-01-14 DIAGNOSIS — G8929 Other chronic pain: Secondary | ICD-10-CM | POA: Diagnosis not present

## 2018-01-14 DIAGNOSIS — M25611 Stiffness of right shoulder, not elsewhere classified: Secondary | ICD-10-CM

## 2018-01-14 DIAGNOSIS — R293 Abnormal posture: Secondary | ICD-10-CM | POA: Diagnosis not present

## 2018-01-14 DIAGNOSIS — M545 Low back pain: Secondary | ICD-10-CM | POA: Diagnosis not present

## 2018-01-14 NOTE — Therapy (Signed)
[facility] [place], [place], [address] Phone: [phone]   Fax:  [phone]  Physical Therapy Treatment  Patient Details  Name: Virginia Young MRN: [id number] Date of Birth: 1941-02-25 Referring Provider: Esmond Plants MD.   Encounter Date: 01/14/2018  PT End of Session - 01/14/18 1001    Visit Number  10    Number of Visits  12    Date for PT Re-Evaluation  01/19/18    Authorization Type  10TH VISIT PROGRESS NOTE AND KX MODIFIER AFTER THE 15 VISIT.    PT Start Time  [phone]    PT Stop Time  1038    PT Time Calculation (min)  47 min    Activity Tolerance  Patient tolerated treatment well    Behavior During Therapy  WFL for tasks assessed/performed       Past Medical History:  Diagnosis Date  . Allergy   . Atrial fibrillation (HCC)    a. coumadin;  b. Amiodarone  . Cataract   . Chest pain 03/2012   a. Lex MV 10/13:  EF 64%, dist ant and apical defect sugg of soft tissue atten, no ischemia  . Chronic systolic heart failure ([place])   . Depression   . Dysrhythmia   . GERD (gastroesophageal reflux disease)   . HLD (hyperlipidemia)   . Incontinence of urine   . Mild mitral regurgitation   . MS (multiple sclerosis) ([place])   . NICM (nonischemic cardiomyopathy) ([place])    a. neg CLite in 2003;  b. EF 40-45% in past;   c.  Echo 12/11: EF 35-40%, mild MR, mild LAE, mild RAE, small pericardial effusion   . Osteoarthritis   . Osteoporosis   . Stasis ulcer ([place])   . Uterine prolapse   . Vaginal prolapse without uterine prolapse   . Varicose veins     Past Surgical History:  Procedure Laterality Date  . ANTERIOR AND POSTERIOR VAGINAL REPAIR W/ SACROSPINOUS LIGAMENT SUSPENSION  2016   [facility]  . BLADDER SURGERY    . CARPAL TUNNEL RELEASE Right 08/22/2017   Procedure: CARPAL TUNNEL RELEASE;  Surgeon: [name], MD;  Location: [place];  Service: Orthopedics;  Laterality: Right;  . cataract surgery Bilateral   . EYE  SURGERY    . INGUINAL HERNIA REPAIR     right  . LIPOMA EXCISION     h/o removed from upper back x 2  . REVERSE SHOULDER ARTHROPLASTY Right 08/22/2017   Procedure: REVERSE SHOULDER ARTHROPLASTY;  Surgeon: [name], MD;  Location: [place];  Service: Orthopedics;  Laterality: Right;    There were no vitals filed for this visit.  Subjective Assessment - 01/14/18 0956    Subjective  Patient arrived with ongoing soreness "not as bad"    Pertinent History  MS; OP.    Limitations  Walking    How long can you stand comfortably?  <10 minutes.    How long can you walk comfortably?  Short distances.    Diagnostic tests  NCV test to right UE.  X-rays    Patient Stated Goals  Move left shoulder with less pain.    Currently in Pain?  Yes    Pain Score  3     Pain Location  Shoulder    Pain Orientation  Right    Pain Descriptors / Indicators  Sore    Pain Type  Surgical pain    Pain Onset  More than a month ago  Pain Frequency  Intermittent    Aggravating Factors   overhead movement    Pain Relieving Factors  at rest         [facility] PT Assessment - 01/14/18 0001      ROM / Strength   AROM / PROM / Strength  AROM;PROM      AROM   AROM Assessment Site  Shoulder    Right/Left Shoulder  Right    Right Shoulder External Rotation  50 Degrees      PROM   PROM Assessment Site  Shoulder    Right/Left Shoulder  Right    Right Shoulder Flexion  131 Degrees    Right Shoulder External Rotation  66 Degrees                   [facility] Adult PT Treatment/Exercise - 01/14/18 0001      Shoulder Exercises: Supine   Flexion  AAROM;Both;Other (comment);20 reps;10 reps cane    Other Supine Exercises  active ceiling punches 2x10    Other Supine Exercises  active flexion with elbow bent to 90 degrees 2x10      Shoulder Exercises: Seated   Other Seated Exercises  seated wall slide and circles AA using LT UE 2x10      Shoulder Exercises: Pulleys   Flexion  3 minutes;1 minute    Other  Pulley Exercises  sitting UE ranger flexion and circles x 4 mins      Moist Heat Therapy   Number Minutes Moist Heat  15 Minutes    Moist Heat Location  Shoulder      Electrical Stimulation   Electrical Stimulation Location  R shoulder    Electrical Stimulation Action  IFC    Electrical Stimulation Parameters  80-150hz  x21min    Electrical Stimulation Goals  Pain      Manual Therapy   Manual Therapy  Passive ROM;Soft tissue mobilization    Manual therapy comments  rhythmic stabs for IR/ER in scaption    Soft tissue mobilization  STW to R bicep to reduce muscle tightness     Passive ROM  PROM of R shoulder into flexion, ER, IR with gentle holds at end ranges               PT Short Term Goals - 12/22/17 1428      PT SHORT TERM GOAL #1   Title  STG's=LTG's.        PT Long Term Goals - 01/14/18 1012      PT LONG TERM GOAL #1   Title  Independent with a HEP.    Baseline  instructed in table slides, isometrics 4 way    Time  4    Period  Weeks    Status  On-going 01/14/18 unsure if compliant, patient reported not doing them daily      PT LONG TERM GOAL #2   Title  Active right shoulder flexion to 145 degrees so the patient can easily reach overhead    Time  4    Period  Weeks    Status  On-going unable to lift actively 01/14/18      PT LONG TERM GOAL #3   Title  Active ER to 70 degrees+ to allow for easily donning/doffing of apparel    Time  4    Period  Weeks    Status  On-going AROM 50 degrees 01/14/18      PT LONG TERM GOAL #4   Title  Increase right  shoulder strength to a solid 4/5 to increase stability for performance of functional activities    Time  4    Period  Weeks    Status  On-going NT 01/14/18      PT LONG TERM GOAL #5   Title  Perform ADL's with right shoulder pain not > 3/10.    Time  4    Period  Weeks    Status  On-going unable to perform ADL's 01/14/18            Plan - 01/14/18 1025    Clinical Impression Statement  Patient  tolerated treatment well today. patient has improved ROM in shoulder passevely. Patient has less sharp pains with movement and able to perform supine active movement with elbow bent. Patient unable to lift arm actively in standing at this time. Goals ongoing at this time.     Rehab Potential  Fair    PT Frequency  2x / week    PT Duration  8 weeks    PT Treatment/Interventions  ADLs/Self Care Home Management;Cryotherapy;Electrical Stimulation;Ultrasound;Moist Heat;Functional mobility training;Therapeutic activities;Therapeutic exercise;Neuromuscular re-education;Patient/family education;Passive range of motion;Vasopneumatic Device;Manual techniques    PT Next Visit Plan  Cont with POC for AAROM, pulley's, UE ranger/table slides or ball on table/P/AAROM for right shoulder and modalities PRN and Please monitor patient symptoms per PT     Consulted and Agree with Plan of Care  Patient       Patient will benefit from skilled therapeutic intervention in order to improve the following deficits and impairments:  Pain, Decreased strength, Impaired UE functional use, Decreased range of motion, Postural dysfunction  Visit Diagnosis: Chronic right shoulder pain  Stiffness of right shoulder, not elsewhere classified  Muscle weakness (generalized)     Problem List Patient Active Problem List   Diagnosis Date Noted  . S/P shoulder replacement, right 08/22/2017  . HTN (hypertension) 02/19/2016  . Post-nasal drip 05/15/2015  . Postoperative abdominal pain 12/07/2014  . Voiding dysfunction 10/07/2014  . SUI (stress urinary incontinence, female) 10/07/2014  . Osteoporosis with fracture 04/06/2014  . Impacted cerumen of both ears 08/20/2013  . Encounter for therapeutic drug monitoring 08/20/2013  . DOE (dyspnea on exertion) 07/23/2013  . Helicobacter positive gastritis 06/21/2013  . Orthostatic hypotension 06/10/2013  . UTI (lower urinary tract infection) 05/06/2013  . Neck mass 02/19/2013  .  Neck pain, bilateral 02/19/2013  . Right groin mass 11/08/2011  . Low back pain 11/08/2011  . Lipoma of neck 06/21/2011  . Memory loss 06/21/2011  . OVERACTIVE BLADDER 08/21/2010  . Long term current use of anticoagulant 07/25/2010  . Osteoarthritis 01/18/2010  . KNEE PAIN 01/18/2010  . FOOT PAIN 01/18/2010  . Hyperlipidemia with target LDL less than 100 04/20/2009  . GERD 04/17/2009  . MITRAL REGURGITATION, MILD 08/30/2008  . NICM (nonischemic cardiomyopathy) ([place]) 08/30/2008  . Congestive heart failure ([place]) 04/13/2007  . DEPRESSION 04/10/2007  . DISEASE, MITRAL VALVE NEC/NOS 04/10/2007  . Atrial fibrillation ([place]) 04/10/2007  . STASIS ULCER 04/10/2007  . VENOUS INSUFFICIENCY, LEGS 04/10/2007    APPLEGATE, [place], PTA 01/14/2018, 10:55 AM  [facility] [address] [place], [place], [address] Phone: [phone]   Fax:  [phone]  Name: REBECKA OELKERS MRN: [id number] Date of Birth: 1941-06-10  Progress Note Reporting Period 12/22/17 to 01/14/18.  See note below for Objective Data and Assessment of Progress/Goals.  Patient demonstrating good improvements with regards to right shoulder P and AROM.  Mali Applegate MPT

## 2018-01-16 ENCOUNTER — Encounter: Payer: Self-pay | Admitting: Physical Therapy

## 2018-01-16 ENCOUNTER — Ambulatory Visit: Payer: Medicare Other | Admitting: Physical Therapy

## 2018-01-16 DIAGNOSIS — G8929 Other chronic pain: Secondary | ICD-10-CM | POA: Diagnosis not present

## 2018-01-16 DIAGNOSIS — M25611 Stiffness of right shoulder, not elsewhere classified: Secondary | ICD-10-CM | POA: Diagnosis not present

## 2018-01-16 DIAGNOSIS — M545 Low back pain: Secondary | ICD-10-CM | POA: Diagnosis not present

## 2018-01-16 DIAGNOSIS — M25511 Pain in right shoulder: Secondary | ICD-10-CM | POA: Diagnosis not present

## 2018-01-16 DIAGNOSIS — M6281 Muscle weakness (generalized): Secondary | ICD-10-CM

## 2018-01-16 DIAGNOSIS — R293 Abnormal posture: Secondary | ICD-10-CM | POA: Diagnosis not present

## 2018-01-16 NOTE — Therapy (Signed)
[facility] [place], [place], [address] Phone: [phone]   Fax:  [phone]  Physical Therapy Treatment  Patient Details  Name: Virginia Young MRN: [id number] Date of Birth: 08-21-1940 Referring Provider: Esmond Plants MD.   Encounter Date: 01/16/2018  PT End of Session - 01/16/18 1008    Visit Number  11    Number of Visits  12    Date for PT Re-Evaluation  01/19/18    Authorization Type  10TH VISIT PROGRESS NOTE AND KX MODIFIER AFTER THE 15 VISIT.    PT Start Time  0957    PT Stop Time  1038    PT Time Calculation (min)  41 min    Activity Tolerance  Patient tolerated treatment well    Behavior During Therapy  WFL for tasks assessed/performed       Past Medical History:  Diagnosis Date  . Allergy   . Atrial fibrillation (HCC)    a. coumadin;  b. Amiodarone  . Cataract   . Chest pain 03/2012   a. Lex MV 10/13:  EF 64%, dist ant and apical defect sugg of soft tissue atten, no ischemia  . Chronic systolic heart failure ([place])   . Depression   . Dysrhythmia   . GERD (gastroesophageal reflux disease)   . HLD (hyperlipidemia)   . Incontinence of urine   . Mild mitral regurgitation   . MS (multiple sclerosis) ([place])   . NICM (nonischemic cardiomyopathy) ([place])    a. neg CLite in 2003;  b. EF 40-45% in past;   c.  Echo 12/11: EF 35-40%, mild MR, mild LAE, mild RAE, small pericardial effusion   . Osteoarthritis   . Osteoporosis   . Stasis ulcer ([place])   . Uterine prolapse   . Vaginal prolapse without uterine prolapse   . Varicose veins     Past Surgical History:  Procedure Laterality Date  . ANTERIOR AND POSTERIOR VAGINAL REPAIR W/ SACROSPINOUS LIGAMENT SUSPENSION  2016   [facility]  . BLADDER SURGERY    . CARPAL TUNNEL RELEASE Right 08/22/2017   Procedure: CARPAL TUNNEL RELEASE;  Surgeon: [name], MD;  Location: [facility];  Service: Orthopedics;  Laterality: Right;  . cataract surgery Bilateral   . EYE  SURGERY    . INGUINAL HERNIA REPAIR     right  . LIPOMA EXCISION     h/o removed from upper back x 2  . REVERSE SHOULDER ARTHROPLASTY Right 08/22/2017   Procedure: REVERSE SHOULDER ARTHROPLASTY;  Surgeon: [name], MD;  Location: [place];  Service: Orthopedics;  Laterality: Right;    There were no vitals filed for this visit.  Subjective Assessment - 01/16/18 0946    Subjective  Reports that her shoulder is better but still fatigues.    Pertinent History  MS; OP.    Limitations  Walking    How long can you stand comfortably?  <10 minutes.    How long can you walk comfortably?  Short distances.    Diagnostic tests  NCV test to right UE.  X-rays    Patient Stated Goals  Move left shoulder with less pain.    Currently in Pain?  Yes    Pain Score  3     Pain Location  Shoulder    Pain Orientation  Right    Pain Descriptors / Indicators  Sore    Pain Type  Surgical pain    Pain Onset  More than a month ago  Pain Frequency  Intermittent    Aggravating Factors   Movement         [facility] PT Assessment - 01/16/18 0001      Assessment   Medical Diagnosis  S/p right reverse total shoulder.    Onset Date/Surgical Date  08/22/17                   [facility] Adult PT Treatment/Exercise - 01/16/18 0001      Shoulder Exercises: Supine   Protraction  AAROM;Both;Other (comment) 3x10 reps    Flexion  AAROM;Both;Other (comment) 3x10 reps      Shoulder Exercises: Standing   Other Standing Exercises  R shoulder wall slides x5 reps but limited secondary to       Shoulder Exercises: Pulleys   Flexion  5 minutes    Other Pulley Exercises  sitting UE ranger flexion and circles x 5 mins      Modalities   Modalities  Electrical Stimulation;Moist Heat      Moist Heat Therapy   Number Minutes Moist Heat  15 Minutes    Moist Heat Location  Shoulder      Electrical Stimulation   Electrical Stimulation Location  R shoulder    Electrical Stimulation Action  Pre-Mod    Electrical  Stimulation Parameters  80-150 hz x15 min    Electrical Stimulation Goals  Pain      Manual Therapy   Manual Therapy  Passive ROM    Passive ROM  PROM of R shoulder into flexion, ER, IR with holds at end range               PT Short Term Goals - 12/22/17 1428      PT SHORT TERM GOAL #1   Title  STG's=LTG's.        PT Long Term Goals - 01/14/18 1012      PT LONG TERM GOAL #1   Title  Independent with a HEP.    Baseline  instructed in table slides, isometrics 4 way    Time  4    Period  Weeks    Status  On-going 01/14/18 unsure if compliant, patient reported not doing them daily      PT LONG TERM GOAL #2   Title  Active right shoulder flexion to 145 degrees so the patient can easily reach overhead    Time  4    Period  Weeks    Status  On-going unable to lift actively 01/14/18      PT LONG TERM GOAL #3   Title  Active ER to 70 degrees+ to allow for easily donning/doffing of apparel    Time  4    Period  Weeks    Status  On-going AROM 50 degrees 01/14/18      PT LONG TERM GOAL #4   Title  Increase right shoulder strength to a solid 4/5 to increase stability for performance of functional activities    Time  4    Period  Weeks    Status  On-going NT 01/14/18      PT LONG TERM GOAL #5   Title  Perform ADL's with right shoulder pain not > 3/10.    Time  4    Period  Weeks    Status  On-going unable to perform ADL's 01/14/18            Plan - 01/16/18 1044    Clinical Impression Statement  Patient tolerated today's treatment fairly well  as she arrived with reports of improvement but minimal pain. Patient still has to be reminded throughout therex to complete exercises slowly to avoid unnecessary pain. Patient's RUE continues to fatigue very quickly with AAROM exercises especially any against gravity. Firm end feels and smooth arc of motion noted throughout PROM session. Normal modalities response noted following removal of the modalities.    Rehab Potential  Fair     PT Frequency  2x / week    PT Duration  8 weeks    PT Treatment/Interventions  ADLs/Self Care Home Management;Cryotherapy;Electrical Stimulation;Ultrasound;Moist Heat;Functional mobility training;Therapeutic activities;Therapeutic exercise;Neuromuscular re-education;Patient/family education;Passive range of motion;Vasopneumatic Device;Manual techniques    PT Next Visit Plan  Cont with POC for AAROM, pulley's, UE ranger/table slides or ball on table/P/AAROM for right shoulder and modalities PRN and Please monitor patient symptoms per PT     Consulted and Agree with Plan of Care  Patient       Patient will benefit from skilled therapeutic intervention in order to improve the following deficits and impairments:  Pain, Decreased strength, Impaired UE functional use, Decreased range of motion, Postural dysfunction  Visit Diagnosis: Chronic right shoulder pain  Stiffness of right shoulder, not elsewhere classified  Muscle weakness (generalized)     Problem List Patient Active Problem List   Diagnosis Date Noted  . S/P shoulder replacement, right 08/22/2017  . HTN (hypertension) 02/19/2016  . Post-nasal drip 05/15/2015  . Postoperative abdominal pain 12/07/2014  . Voiding dysfunction 10/07/2014  . SUI (stress urinary incontinence, female) 10/07/2014  . Osteoporosis with fracture 04/06/2014  . Impacted cerumen of both ears 08/20/2013  . Encounter for therapeutic drug monitoring 08/20/2013  . DOE (dyspnea on exertion) 07/23/2013  . Helicobacter positive gastritis 06/21/2013  . Orthostatic hypotension 06/10/2013  . UTI (lower urinary tract infection) 05/06/2013  . Neck mass 02/19/2013  . Neck pain, bilateral 02/19/2013  . Right groin mass 11/08/2011  . Low back pain 11/08/2011  . Lipoma of neck 06/21/2011  . Memory loss 06/21/2011  . OVERACTIVE BLADDER 08/21/2010  . Long term current use of anticoagulant 07/25/2010  . Osteoarthritis 01/18/2010  . KNEE PAIN 01/18/2010  . FOOT  PAIN 01/18/2010  . Hyperlipidemia with target LDL less than 100 04/20/2009  . GERD 04/17/2009  . MITRAL REGURGITATION, MILD 08/30/2008  . NICM (nonischemic cardiomyopathy) ([place]) 08/30/2008  . Congestive heart failure ([place]) 04/13/2007  . DEPRESSION 04/10/2007  . DISEASE, MITRAL VALVE NEC/NOS 04/10/2007  . Atrial fibrillation ([place]) 04/10/2007  . STASIS ULCER 04/10/2007  . VENOUS INSUFFICIENCY, LEGS 04/10/2007    Standley Brooking, PTA 01/16/2018, 10:47 AM  [facility] [address] [place], [place], [address] Phone: [phone]   Fax:  [phone]  Name: Virginia Young MRN: [id number] Date of Birth: 23-Mar-1941

## 2018-01-19 ENCOUNTER — Ambulatory Visit: Payer: Medicare Other | Admitting: Physical Therapy

## 2018-01-19 DIAGNOSIS — R293 Abnormal posture: Secondary | ICD-10-CM | POA: Diagnosis not present

## 2018-01-19 DIAGNOSIS — M545 Low back pain: Secondary | ICD-10-CM | POA: Diagnosis not present

## 2018-01-19 DIAGNOSIS — G8929 Other chronic pain: Secondary | ICD-10-CM | POA: Diagnosis not present

## 2018-01-19 DIAGNOSIS — M25611 Stiffness of right shoulder, not elsewhere classified: Secondary | ICD-10-CM

## 2018-01-19 DIAGNOSIS — M6281 Muscle weakness (generalized): Secondary | ICD-10-CM | POA: Diagnosis not present

## 2018-01-19 DIAGNOSIS — M25511 Pain in right shoulder: Principal | ICD-10-CM

## 2018-01-19 NOTE — Therapy (Signed)
[facility] [place], [place], [address] Phone: [phone]   Fax:  [phone]  Physical Therapy Treatment  Patient Details  Name: Virginia Young MRN: [id number] Date of Birth: 1940-09-20 Referring Provider: Esmond Plants MD.   Encounter Date: 01/19/2018  PT End of Session - 01/19/18 0952    Visit Number  12    Number of Visits  22    Date for PT Re-Evaluation  03/06/18    Authorization Type  10TH VISIT PROGRESS NOTE AND KX MODIFIER AFTER THE 15 VISIT.    PT Start Time  0945    PT Stop Time  1036    PT Time Calculation (min)  51 min    Activity Tolerance  Patient tolerated treatment well    Behavior During Therapy  WFL for tasks assessed/performed       Past Medical History:  Diagnosis Date  . Allergy   . Atrial fibrillation (HCC)    a. coumadin;  b. Amiodarone  . Cataract   . Chest pain 03/2012   a. Lex MV 10/13:  EF 64%, dist ant and apical defect sugg of soft tissue atten, no ischemia  . Chronic systolic heart failure ([place])   . Depression   . Dysrhythmia   . GERD (gastroesophageal reflux disease)   . HLD (hyperlipidemia)   . Incontinence of urine   . Mild mitral regurgitation   . MS (multiple sclerosis) ([place])   . NICM (nonischemic cardiomyopathy) ([place])    a. neg CLite in 2003;  b. EF 40-45% in past;   c.  Echo 12/11: EF 35-40%, mild MR, mild LAE, mild RAE, small pericardial effusion   . Osteoarthritis   . Osteoporosis   . Stasis ulcer ([place])   . Uterine prolapse   . Vaginal prolapse without uterine prolapse   . Varicose veins     Past Surgical History:  Procedure Laterality Date  . ANTERIOR AND POSTERIOR VAGINAL REPAIR W/ SACROSPINOUS LIGAMENT SUSPENSION  2016   [facility]  . BLADDER SURGERY    . CARPAL TUNNEL RELEASE Right 08/22/2017   Procedure: CARPAL TUNNEL RELEASE;  Surgeon: [name], MD;  Location: [facility];  Service: Orthopedics;  Laterality: Right;  . cataract surgery Bilateral   . EYE  SURGERY    . INGUINAL HERNIA REPAIR     right  . LIPOMA EXCISION     h/o removed from upper back x 2  . REVERSE SHOULDER ARTHROPLASTY Right 08/22/2017   Procedure: REVERSE SHOULDER ARTHROPLASTY;  Surgeon: [name], MD;  Location: [facility];  Service: Orthopedics;  Laterality: Right;    There were no vitals filed for this visit.      [facility] PT Assessment - 01/19/18 0001      Assessment   Medical Diagnosis  S/p right reverse total shoulder.    Onset Date/Surgical Date  08/22/17      ROM / Strength   AROM / PROM / Strength  AROM;PROM      PROM   PROM Assessment Site  Shoulder    Right/Left Shoulder  Right    Right Shoulder Flexion  133 Degrees    Right Shoulder External Rotation  56 Degrees                   [facility] Adult PT Treatment/Exercise - 01/19/18 0001      Shoulder Exercises: Supine   Protraction  AAROM;Both;Other (comment) 3x10 with PVC    External Rotation  AAROM;Right;Other (comment) 3x10 with PVC  Flexion  AAROM;Both;Other (comment) 3x10 with PVC      Shoulder Exercises: Pulleys   Flexion  5 minutes    Other Pulley Exercises  sitting UE ranger flexion and circles x 5 mins      Modalities   Modalities  Electrical Stimulation;Moist Heat      Moist Heat Therapy   Number Minutes Moist Heat  15 Minutes    Moist Heat Location  Shoulder      Electrical Stimulation   Electrical Stimulation Location  R shoulder    Electrical Stimulation Action  pre-mod    Electrical Stimulation Parameters  80-150 hz x15 min    Electrical Stimulation Goals  Pain      Manual Therapy   Manual Therapy  Passive ROM    Passive ROM  PROM of R shoulder into flexion, ER, IR with holds at end range               PT Short Term Goals - 12/22/17 1428      PT SHORT TERM GOAL #1   Title  STG's=LTG's.        PT Long Term Goals - 01/19/18 1026      PT LONG TERM GOAL #1   Title  Independent with a HEP.    Baseline  instructed in table slides, isometrics 4 way     Time  4    Period  Weeks    Status  On-going "I do them sometimes"      PT LONG TERM GOAL #2   Title  Active right shoulder flexion to 145 degrees so the patient can easily reach overhead    Time  4    Period  Weeks    Status  On-going PROM 133      PT LONG TERM GOAL #3   Title  Active ER to 70 degrees+ to allow for easily donning/doffing of apparel    Time  4    Period  Weeks    Status  On-going PROM 56 degrees      PT LONG TERM GOAL #4   Title  Increase right shoulder strength to a solid 4/5 to increase stability for performance of functional activities    Time  4    Period  Weeks    Status  On-going NT 01/19/18      PT LONG TERM GOAL #5   Title  Perform ADL's with right shoulder pain not > 3/10.    Period  Weeks    Status  On-going            Plan - 01/19/18 1023    Clinical Impression Statement  Patient was able to tolerate treament well with minimal reports of pain during session. Patient continues to require verbal and tactile cuing for proper form and to perform exercises slowly. Patient with improved PROM, see note. Patient still very limited with AROM secondary to pain. Requesting additional visits to address ongoing goals. Normal response to modalities upon removal.    Rehab Potential  Fair    PT Frequency  2x / week    PT Duration  8 weeks    PT Treatment/Interventions  ADLs/Self Care Home Management;Cryotherapy;Electrical Stimulation;Ultrasound;Moist Heat;Functional mobility training;Therapeutic activities;Therapeutic exercise;Neuromuscular re-education;Patient/family education;Passive range of motion;Vasopneumatic Device;Manual techniques    PT Next Visit Plan  Cont with POC for AAROM, pulley's, UE ranger/table slides or ball on table/P/AAROM for right shoulder and modalities PRN for pain relief    Consulted and Agree with Plan of Care  Patient       Patient will benefit from skilled therapeutic intervention in order to improve the following deficits and  impairments:  Pain, Decreased strength, Impaired UE functional use, Decreased range of motion, Postural dysfunction  Visit Diagnosis: Chronic right shoulder pain - Plan: PT plan of care cert/re-cert  Stiffness of right shoulder, not elsewhere classified - Plan: PT plan of care cert/re-cert  Muscle weakness (generalized) - Plan: PT plan of care cert/re-cert     Problem List Patient Active Problem List   Diagnosis Date Noted  . S/P shoulder replacement, right 08/22/2017  . HTN (hypertension) 02/19/2016  . Post-nasal drip 05/15/2015  . Postoperative abdominal pain 12/07/2014  . Voiding dysfunction 10/07/2014  . SUI (stress urinary incontinence, female) 10/07/2014  . Osteoporosis with fracture 04/06/2014  . Impacted cerumen of both ears 08/20/2013  . Encounter for therapeutic drug monitoring 08/20/2013  . DOE (dyspnea on exertion) 07/23/2013  . Helicobacter positive gastritis 06/21/2013  . Orthostatic hypotension 06/10/2013  . UTI (lower urinary tract infection) 05/06/2013  . Neck mass 02/19/2013  . Neck pain, bilateral 02/19/2013  . Right groin mass 11/08/2011  . Low back pain 11/08/2011  . Lipoma of neck 06/21/2011  . Memory loss 06/21/2011  . OVERACTIVE BLADDER 08/21/2010  . Long term current use of anticoagulant 07/25/2010  . Osteoarthritis 01/18/2010  . KNEE PAIN 01/18/2010  . FOOT PAIN 01/18/2010  . Hyperlipidemia with target LDL less than 100 04/20/2009  . GERD 04/17/2009  . MITRAL REGURGITATION, MILD 08/30/2008  . NICM (nonischemic cardiomyopathy) ([place]) 08/30/2008  . Congestive heart failure ([place]) 04/13/2007  . DEPRESSION 04/10/2007  . DISEASE, MITRAL VALVE NEC/NOS 04/10/2007  . Atrial fibrillation ([place]) 04/10/2007  . STASIS ULCER 04/10/2007  . VENOUS INSUFFICIENCY, LEGS 04/10/2007    Gabriela Eves, PT, DPT 01/19/2018, 10:42 AM  [facility] [address] [place], [place], [address] Phone: [phone]   Fax:   [phone]  Name: Virginia Young MRN: [id number] Date of Birth: 14-Nov-1940

## 2018-01-21 ENCOUNTER — Ambulatory Visit: Payer: Medicare Other | Admitting: Physical Therapy

## 2018-01-21 ENCOUNTER — Encounter: Payer: Self-pay | Admitting: Physical Therapy

## 2018-01-21 DIAGNOSIS — M25511 Pain in right shoulder: Secondary | ICD-10-CM | POA: Diagnosis not present

## 2018-01-21 DIAGNOSIS — M25611 Stiffness of right shoulder, not elsewhere classified: Secondary | ICD-10-CM

## 2018-01-21 DIAGNOSIS — G8929 Other chronic pain: Secondary | ICD-10-CM | POA: Diagnosis not present

## 2018-01-21 DIAGNOSIS — M545 Low back pain: Secondary | ICD-10-CM | POA: Diagnosis not present

## 2018-01-21 DIAGNOSIS — M6281 Muscle weakness (generalized): Secondary | ICD-10-CM | POA: Diagnosis not present

## 2018-01-21 DIAGNOSIS — R293 Abnormal posture: Secondary | ICD-10-CM | POA: Diagnosis not present

## 2018-01-21 NOTE — Therapy (Signed)
[facility] [place], [place], [address] Phone: [phone]   Fax:  [phone]  Physical Therapy Treatment  Patient Details  Name: Virginia Young MRN: [id number] Date of Birth: 09/29/1940 Referring Provider: Esmond Plants MD.   Encounter Date: 01/21/2018  PT End of Session - 01/21/18 1010    Visit Number  13    Number of Visits  22    Date for PT Re-Evaluation  03/06/18    Authorization Type  10TH VISIT PROGRESS NOTE AND KX MODIFIER AFTER THE 15 VISIT.    PT Start Time  0945    PT Stop Time  1033    PT Time Calculation (min)  48 min    Activity Tolerance  Patient tolerated treatment well    Behavior During Therapy  WFL for tasks assessed/performed       Past Medical History:  Diagnosis Date  . Allergy   . Atrial fibrillation (HCC)    a. coumadin;  b. Amiodarone  . Cataract   . Chest pain 03/2012   a. Lex MV 10/13:  EF 64%, dist ant and apical defect sugg of soft tissue atten, no ischemia  . Chronic systolic heart failure ([place])   . Depression   . Dysrhythmia   . GERD (gastroesophageal reflux disease)   . HLD (hyperlipidemia)   . Incontinence of urine   . Mild mitral regurgitation   . MS (multiple sclerosis) ([place])   . NICM (nonischemic cardiomyopathy) ([place])    a. neg CLite in 2003;  b. EF 40-45% in past;   c.  Echo 12/11: EF 35-40%, mild MR, mild LAE, mild RAE, small pericardial effusion   . Osteoarthritis   . Osteoporosis   . Stasis ulcer ([place])   . Uterine prolapse   . Vaginal prolapse without uterine prolapse   . Varicose veins     Past Surgical History:  Procedure Laterality Date  . ANTERIOR AND POSTERIOR VAGINAL REPAIR W/ SACROSPINOUS LIGAMENT SUSPENSION  2016   [facility]  . BLADDER SURGERY    . CARPAL TUNNEL RELEASE Right 08/22/2017   Procedure: CARPAL TUNNEL RELEASE;  Surgeon: [name], MD;  Location: [facility];  Service: Orthopedics;  Laterality: Right;  . cataract surgery Bilateral   . EYE  SURGERY    . INGUINAL HERNIA REPAIR     right  . LIPOMA EXCISION     h/o removed from upper back x 2  . REVERSE SHOULDER ARTHROPLASTY Right 08/22/2017   Procedure: REVERSE SHOULDER ARTHROPLASTY;  Surgeon: [name], MD;  Location: [facility];  Service: Orthopedics;  Laterality: Right;    There were no vitals filed for this visit.  Subjective Assessment - 01/21/18 0946    Subjective  Patient reported some ongoing soreness in shoulder, did good after last treatment    Pertinent History  MS; OP.    Limitations  Walking    How long can you stand comfortably?  <10 minutes.    How long can you walk comfortably?  Short distances.    Diagnostic tests  NCV test to right UE.  X-rays    Patient Stated Goals  Move left shoulder with less pain.    Currently in Pain?  Yes    Pain Score  3     Pain Location  Shoulder    Pain Orientation  Right    Pain Descriptors / Indicators  Sore    Pain Type  Surgical pain    Pain Onset  More than a month  ago    Pain Frequency  Intermittent    Aggravating Factors   overhead movement    Pain Relieving Factors  at rest         [facility] PT Assessment - 01/21/18 0001      PROM   PROM Assessment Site  Shoulder    Right/Left Shoulder  Right    Right Shoulder Flexion  135 Degrees    Right Shoulder External Rotation  59 Degrees                   [facility] Adult PT Treatment/Exercise - 01/21/18 0001      Shoulder Exercises: Supine   Flexion  AAROM;Both;Other (comment);20 reps with cane    Other Supine Exercises  active ceiling punches 2x10    Other Supine Exercises  active flexion with elbow bent to 90 degrees 2x10      Shoulder Exercises: Seated   Other Seated Exercises  seated for chest press and flex to 90 degrees x 10 each      Shoulder Exercises: Pulleys   Flexion  3 minutes    Other Pulley Exercises  sitting UE ranger flexion and circles x 5 mins      Moist Heat Therapy   Number Minutes Moist Heat  15 Minutes    Moist Heat Location   Shoulder      Electrical Stimulation   Electrical Stimulation Location  R shoulder    Electrical Stimulation Action  premod    Electrical Stimulation Parameters  80-150hz  x48min    Electrical Stimulation Goals  Pain      Manual Therapy   Manual Therapy  Passive ROM    Manual therapy comments  rhythmic stabs for IR/ER in scaption    Passive ROM  PROM of R shoulder into flexion, ER, IR with holds at end range               PT Short Term Goals - 12/22/17 1428      PT SHORT TERM GOAL #1   Title  STG's=LTG's.        PT Long Term Goals - 01/19/18 1026      PT LONG TERM GOAL #1   Title  Independent with a HEP.    Baseline  instructed in table slides, isometrics 4 way    Time  4    Period  Weeks    Status  On-going "I do them sometimes"      PT LONG TERM GOAL #2   Title  Active right shoulder flexion to 145 degrees so the patient can easily reach overhead    Time  4    Period  Weeks    Status  On-going PROM 133      PT LONG TERM GOAL #3   Title  Active ER to 70 degrees+ to allow for easily donning/doffing of apparel    Time  4    Period  Weeks    Status  On-going PROM 56 degrees      PT LONG TERM GOAL #4   Title  Increase right shoulder strength to a solid 4/5 to increase stability for performance of functional activities    Time  4    Period  Weeks    Status  On-going NT 01/19/18      PT LONG TERM GOAL #5   Title  Perform ADL's with right shoulder pain not > 3/10.    Period  Weeks    Status  On-going  Plan - 01/21/18 1012    Clinical Impression Statement  Patient tolerated treatment well today. Patienthas reported less discomfort for light ADL's when using her right UE. Patient has improved with ROM and able to perform some light supine active movement. Patient progressing yet goals ongoing at this time.      Rehab Potential  Fair    PT Frequency  2x / week    PT Duration  8 weeks    PT Treatment/Interventions  ADLs/Self Care Home  Management;Cryotherapy;Electrical Stimulation;Ultrasound;Moist Heat;Functional mobility training;Therapeutic activities;Therapeutic exercise;Neuromuscular re-education;Patient/family education;Passive range of motion;Vasopneumatic Device;Manual techniques    PT Next Visit Plan  Cont with POC for AAROM, pulley's, UE ranger/table slides or ball on table/P/AAROM for right shoulder and modalities PRN for pain relief    Consulted and Agree with Plan of Care  Patient       Patient will benefit from skilled therapeutic intervention in order to improve the following deficits and impairments:  Pain, Decreased strength, Impaired UE functional use, Decreased range of motion, Postural dysfunction  Visit Diagnosis: Chronic right shoulder pain  Stiffness of right shoulder, not elsewhere classified  Muscle weakness (generalized)     Problem List Patient Active Problem List   Diagnosis Date Noted  . S/P shoulder replacement, right 08/22/2017  . HTN (hypertension) 02/19/2016  . Post-nasal drip 05/15/2015  . Postoperative abdominal pain 12/07/2014  . Voiding dysfunction 10/07/2014  . SUI (stress urinary incontinence, female) 10/07/2014  . Osteoporosis with fracture 04/06/2014  . Impacted cerumen of both ears 08/20/2013  . Encounter for therapeutic drug monitoring 08/20/2013  . DOE (dyspnea on exertion) 07/23/2013  . Helicobacter positive gastritis 06/21/2013  . Orthostatic hypotension 06/10/2013  . UTI (lower urinary tract infection) 05/06/2013  . Neck mass 02/19/2013  . Neck pain, bilateral 02/19/2013  . Right groin mass 11/08/2011  . Low back pain 11/08/2011  . Lipoma of neck 06/21/2011  . Memory loss 06/21/2011  . OVERACTIVE BLADDER 08/21/2010  . Long term current use of anticoagulant 07/25/2010  . Osteoarthritis 01/18/2010  . KNEE PAIN 01/18/2010  . FOOT PAIN 01/18/2010  . Hyperlipidemia with target LDL less than 100 04/20/2009  . GERD 04/17/2009  . MITRAL REGURGITATION, MILD  08/30/2008  . NICM (nonischemic cardiomyopathy) ([place]) 08/30/2008  . Congestive heart failure ([place]) 04/13/2007  . DEPRESSION 04/10/2007  . DISEASE, MITRAL VALVE NEC/NOS 04/10/2007  . Atrial fibrillation ([place]) 04/10/2007  . STASIS ULCER 04/10/2007  . VENOUS INSUFFICIENCY, LEGS 04/10/2007    Roshard Rezabek P, PTA 01/21/2018, 10:34 AM  [facility] [place], [place], [address] Phone: [phone]   Fax:  [phone]  Name: TOBI GROESBECK MRN: [id number] Date of Birth: [birth year]

## 2018-01-22 ENCOUNTER — Ambulatory Visit (INDEPENDENT_AMBULATORY_CARE_PROVIDER_SITE_OTHER): Payer: Medicare Other | Admitting: Pediatrics

## 2018-01-22 ENCOUNTER — Encounter: Payer: Self-pay | Admitting: Pediatrics

## 2018-01-22 VITALS — BP 134/83 | HR 84 | Temp 96.8°F | Ht 70.0 in | Wt 177.4 lb

## 2018-01-22 DIAGNOSIS — E871 Hypo-osmolality and hyponatremia: Secondary | ICD-10-CM

## 2018-01-22 DIAGNOSIS — M159 Polyosteoarthritis, unspecified: Secondary | ICD-10-CM

## 2018-01-22 DIAGNOSIS — E785 Hyperlipidemia, unspecified: Secondary | ICD-10-CM

## 2018-01-22 DIAGNOSIS — M8949 Other hypertrophic osteoarthropathy, multiple sites: Secondary | ICD-10-CM

## 2018-01-22 DIAGNOSIS — M150 Primary generalized (osteo)arthritis: Secondary | ICD-10-CM | POA: Diagnosis not present

## 2018-01-22 DIAGNOSIS — I480 Paroxysmal atrial fibrillation: Secondary | ICD-10-CM | POA: Diagnosis not present

## 2018-01-22 NOTE — Progress Notes (Signed)
  Subjective:   Patient ID: Virginia Young, female    DOB: [birth year], 77 y.o.   MRN: [id number] CC: Medical Management of Chronic Issues  HPI: Virginia Young is a 77 y.o. female   Afib: on DOAC. No bleeding. Taking regularly. No heart palpitations.  Osteoarthritis: pain in R arm improved  Hyponatremia: no longer on diurectics  Lower abd pain, still bothers her sometimes off and on. Having regular bowel movements on linzess. No fevers. Appetite has been good. No nausea or vomiting.  Relevant past medical, surgical, family and social history reviewed. Allergies and medications reviewed and updated. Social History   Tobacco Use  Smoking Status Never Smoker  Smokeless Tobacco Never Used   ROS: Per HPI   Objective:    BP 134/83   Pulse 84   Temp (!) 96.8 F (36 C) (Oral)   Ht '5\' 10"'$  (1.778 m)   Wt 177 lb 6.4 oz (80.5 kg)   BMI 25.45 kg/m   Wt Readings from Last 3 Encounters:  01/22/18 177 lb 6.4 oz (80.5 kg)  12/23/17 174 lb 9.6 oz (79.2 kg)  10/22/17 177 lb 3.2 oz (80.4 kg)    Gen: NAD, alert, cooperative with exam, NCAT EYES: EOMI, no conjunctival injection, or no icterus CV: NRRR, normal S1/S2 Resp: CTABL, no wheezes, normal WOB Abd: +BS, soft, NTND. Ext: No edema, warm Neuro: Alert and oriented, strength equal b/l UE and LE, coordination grossly normal MSK: normal muscle bulk  Assessment & Plan:  Virginia Young was seen today for medical management of chronic issues.  Diagnoses and all orders for this visit:  Paroxysmal atrial fibrillation (HCC) Stable, cont DOAC  Hyponatremia Due for recheck -     BMP8+EGFR  Primary osteoarthritis involving multiple joints Cont PT as able  Hyperlipidemia, unspecified hyperlipidemia type -     LDL Cholesterol, Direct -     Cholesterol, total -     HDL cholesterol   Follow up plan: Return in about 6 months (around 07/25/2018). Assunta Found, MD [place]

## 2018-01-23 ENCOUNTER — Ambulatory Visit: Payer: Medicare Other | Attending: Orthopedic Surgery | Admitting: Physical Therapy

## 2018-01-23 DIAGNOSIS — M545 Low back pain: Secondary | ICD-10-CM | POA: Insufficient documentation

## 2018-01-23 DIAGNOSIS — R293 Abnormal posture: Secondary | ICD-10-CM | POA: Insufficient documentation

## 2018-01-23 DIAGNOSIS — M25611 Stiffness of right shoulder, not elsewhere classified: Secondary | ICD-10-CM | POA: Insufficient documentation

## 2018-01-23 DIAGNOSIS — M6281 Muscle weakness (generalized): Secondary | ICD-10-CM | POA: Diagnosis not present

## 2018-01-23 DIAGNOSIS — M25511 Pain in right shoulder: Secondary | ICD-10-CM | POA: Insufficient documentation

## 2018-01-23 DIAGNOSIS — G8929 Other chronic pain: Secondary | ICD-10-CM | POA: Insufficient documentation

## 2018-01-23 NOTE — Therapy (Signed)
[facility] [place], [place], [address] Phone: [phone]   Fax:  [phone]  Physical Therapy Treatment  Patient Details  Name: Virginia Young MRN: [id number] Date of Birth: 1940-11-17 Referring Provider: Esmond Plants MD.   Encounter Date: 01/23/2018  PT End of Session - 01/23/18 0948    Visit Number  14    Number of Visits  22    Date for PT Re-Evaluation  03/06/18    Authorization Type  10TH VISIT PROGRESS NOTE AND KX MODIFIER AFTER THE 15 VISIT.    PT Start Time  0945    PT Stop Time  1035    PT Time Calculation (min)  50 min    Activity Tolerance  Patient tolerated treatment well    Behavior During Therapy  WFL for tasks assessed/performed       Past Medical History:  Diagnosis Date  . Allergy   . Atrial fibrillation (HCC)    a. coumadin;  b. Amiodarone  . Cataract   . Chest pain 03/2012   a. Lex MV 10/13:  EF 64%, dist ant and apical defect sugg of soft tissue atten, no ischemia  . Chronic systolic heart failure ([place])   . Depression   . Dysrhythmia   . GERD (gastroesophageal reflux disease)   . HLD (hyperlipidemia)   . Incontinence of urine   . Mild mitral regurgitation   . MS (multiple sclerosis) ([place])   . NICM (nonischemic cardiomyopathy) ([place])    a. neg CLite in 2003;  b. EF 40-45% in past;   c.  Echo 12/11: EF 35-40%, mild MR, mild LAE, mild RAE, small pericardial effusion   . Osteoarthritis   . Osteoporosis   . Stasis ulcer ([place])   . Uterine prolapse   . Vaginal prolapse without uterine prolapse   . Varicose veins     Past Surgical History:  Procedure Laterality Date  . ANTERIOR AND POSTERIOR VAGINAL REPAIR W/ SACROSPINOUS LIGAMENT SUSPENSION  2016   [facility]  . BLADDER SURGERY    . CARPAL TUNNEL RELEASE Right 08/22/2017   Procedure: CARPAL TUNNEL RELEASE;  Surgeon: [name], MD;  Location: [place] [facility];  Service: Orthopedics;  Laterality: Right;  . cataract surgery Bilateral   . EYE SURGERY     . INGUINAL HERNIA REPAIR     right  . LIPOMA EXCISION     h/o removed from upper back x 2  . REVERSE SHOULDER ARTHROPLASTY Right 08/22/2017   Procedure: REVERSE SHOULDER ARTHROPLASTY;  Surgeon: [name], MD;  Location: [place] [facility];  Service: Orthopedics;  Laterality: Right;    There were no vitals filed for this visit.  Subjective Assessment - 01/23/18 0947    Subjective  Patient reports right shoulder stays sore. Patient is 5 months post-op as of yesterday, 01/22/18.    Pertinent History  MS; OP.    Limitations  Walking    How long can you stand comfortably?  <10 minutes.    How long can you walk comfortably?  Short distances.    Diagnostic tests  NCV test to right UE.  X-rays    Patient Stated Goals  Move left shoulder with less pain.    Currently in Pain?  Yes    Pain Score  2     Pain Location  Shoulder    Pain Orientation  Right    Pain Descriptors / Indicators  Sore;Constant    Pain Onset  More than a month ago    Pain  Frequency  Intermittent    Multiple Pain Sites  No         [facility] PT Assessment - 01/23/18 0001      Assessment   Medical Diagnosis  S/p right reverse total shoulder.                   [facility] Adult PT Treatment/Exercise - 01/23/18 0001      Shoulder Exercises: Seated   Other Seated Exercises  seated for chest press and flex to 90 degrees 2x10 each      Shoulder Exercises: Pulleys   Flexion  5 minutes;1 minute 6 minutes      Shoulder Exercises: Therapy Ball   Other Therapy Ball Exercises  ABCs capital and lower case followed by circles and flexion x5 minutes      Modalities   Modalities  Electrical Stimulation;Moist Heat      Moist Heat Therapy   Number Minutes Moist Heat  15 Minutes    Moist Heat Location  Shoulder      Electrical Stimulation   Electrical Stimulation Location  R shoulder    Electrical Stimulation Action  pre-mod    Electrical Stimulation Parameters  80-150 hz x15 min    Electrical Stimulation Goals  Pain       Manual Therapy   Manual Therapy  Passive ROM    Passive ROM  PROM of R shoulder into flexion, ER, IR with holds at end range               PT Short Term Goals - 12/22/17 1428      PT SHORT TERM GOAL #1   Title  STG's=LTG's.        PT Long Term Goals - 01/19/18 1026      PT LONG TERM GOAL #1   Title  Independent with a HEP.    Baseline  instructed in table slides, isometrics 4 way    Time  4    Period  Weeks    Status  On-going "I do them sometimes"      PT LONG TERM GOAL #2   Title  Active right shoulder flexion to 145 degrees so the patient can easily reach overhead    Time  4    Period  Weeks    Status  On-going PROM 133      PT LONG TERM GOAL #3   Title  Active ER to 70 degrees+ to allow for easily donning/doffing of apparel    Time  4    Period  Weeks    Status  On-going PROM 56 degrees      PT LONG TERM GOAL #4   Title  Increase right shoulder strength to a solid 4/5 to increase stability for performance of functional activities    Time  4    Period  Weeks    Status  On-going NT 01/19/18      PT LONG TERM GOAL #5   Title  Perform ADL's with right shoulder pain not > 3/10.    Period  Weeks    Status  On-going            Plan - 01/23/18 1046    Clinical Impression Statement  Patient was able to tolerate treatment well today. Patient noted with smooth PROM but required intermittent oscillations to promote relaxation. No adverse affects noted upon removal of modalities    Rehab Potential  Fair    PT Frequency  2x / week  PT Duration  8 weeks    PT Treatment/Interventions  ADLs/Self Care Home Management;Cryotherapy;Electrical Stimulation;Ultrasound;Moist Heat;Functional mobility training;Therapeutic activities;Therapeutic exercise;Neuromuscular re-education;Patient/family education;Passive range of motion;Vasopneumatic Device;Manual techniques    PT Next Visit Plan  Cont with POC for AAROM, pulley's, UE ranger/table slides or ball on table/P/AAROM  for right shoulder and modalities PRN for pain relief    Consulted and Agree with Plan of Care  Patient       Patient will benefit from skilled therapeutic intervention in order to improve the following deficits and impairments:  Pain, Decreased strength, Impaired UE functional use, Decreased range of motion, Postural dysfunction  Visit Diagnosis: Chronic right shoulder pain  Stiffness of right shoulder, not elsewhere classified  Muscle weakness (generalized)     Problem List Patient Active Problem List   Diagnosis Date Noted  . S/P shoulder replacement, right 08/22/2017  . HTN (hypertension) 02/19/2016  . Post-nasal drip 05/15/2015  . Postoperative abdominal pain 12/07/2014  . Voiding dysfunction 10/07/2014  . SUI (stress urinary incontinence, female) 10/07/2014  . Osteoporosis with fracture 04/06/2014  . Impacted cerumen of both ears 08/20/2013  . Encounter for therapeutic drug monitoring 08/20/2013  . DOE (dyspnea on exertion) 07/23/2013  . Helicobacter positive gastritis 06/21/2013  . Orthostatic hypotension 06/10/2013  . UTI (lower urinary tract infection) 05/06/2013  . Neck mass 02/19/2013  . Neck pain, bilateral 02/19/2013  . Right groin mass 11/08/2011  . Low back pain 11/08/2011  . Lipoma of neck 06/21/2011  . Memory loss 06/21/2011  . OVERACTIVE BLADDER 08/21/2010  . Long term current use of anticoagulant 07/25/2010  . Osteoarthritis 01/18/2010  . KNEE PAIN 01/18/2010  . FOOT PAIN 01/18/2010  . Hyperlipidemia with target LDL less than 100 04/20/2009  . GERD 04/17/2009  . MITRAL REGURGITATION, MILD 08/30/2008  . NICM (nonischemic cardiomyopathy) ([place]) 08/30/2008  . Congestive heart failure ([place]) 04/13/2007  . DEPRESSION 04/10/2007  . DISEASE, MITRAL VALVE NEC/NOS 04/10/2007  . Atrial fibrillation ([place]) 04/10/2007  . STASIS ULCER 04/10/2007  . VENOUS INSUFFICIENCY, LEGS 04/10/2007   Gabriela Eves, PT, DPT 01/23/2018, 10:49 AM  [facility] [address] [place], [place], [address] Phone: [phone]   Fax:  [phone]  Name: Virginia Young MRN: [id number] Date of Birth: 1941/01/20

## 2018-01-26 ENCOUNTER — Encounter: Payer: Self-pay | Admitting: Physical Therapy

## 2018-01-26 ENCOUNTER — Ambulatory Visit: Payer: Medicare Other | Admitting: Physical Therapy

## 2018-01-26 DIAGNOSIS — M545 Low back pain: Secondary | ICD-10-CM | POA: Diagnosis not present

## 2018-01-26 DIAGNOSIS — M25611 Stiffness of right shoulder, not elsewhere classified: Secondary | ICD-10-CM | POA: Diagnosis not present

## 2018-01-26 DIAGNOSIS — M25511 Pain in right shoulder: Principal | ICD-10-CM

## 2018-01-26 DIAGNOSIS — M6281 Muscle weakness (generalized): Secondary | ICD-10-CM

## 2018-01-26 DIAGNOSIS — G8929 Other chronic pain: Secondary | ICD-10-CM | POA: Diagnosis not present

## 2018-01-26 DIAGNOSIS — R293 Abnormal posture: Secondary | ICD-10-CM | POA: Diagnosis not present

## 2018-01-26 NOTE — Therapy (Signed)
[facility] [place], [place], [address] Phone: [phone]   Fax:  [phone]  Physical Therapy Treatment  Patient Details  Name: FELMA PFEFFERLE MRN: [id number] Date of Birth: 05/12/1941 Referring Provider: Esmond Plants MD.   Encounter Date: 01/26/2018  PT End of Session - 01/26/18 1012    Visit Number  15    Number of Visits  22    Date for PT Re-Evaluation  03/06/18    Authorization Type  10TH VISIT PROGRESS NOTE AND KX MODIFIER AFTER THE 15 VISIT.    PT Start Time  [phone]    PT Stop Time  1029    PT Time Calculation (min)  43 min    Activity Tolerance  Patient tolerated treatment well    Behavior During Therapy  WFL for tasks assessed/performed       Past Medical History:  Diagnosis Date  . Allergy   . Atrial fibrillation (HCC)    a. coumadin;  b. Amiodarone  . Cataract   . Chest pain 03/2012   a. Lex MV 10/13:  EF 64%, dist ant and apical defect sugg of soft tissue atten, no ischemia  . Chronic systolic heart failure ([place])   . Depression   . Dysrhythmia   . GERD (gastroesophageal reflux disease)   . HLD (hyperlipidemia)   . Incontinence of urine   . Mild mitral regurgitation   . MS (multiple sclerosis) ([place])   . NICM (nonischemic cardiomyopathy) ([place])    a. neg CLite in 2003;  b. EF 40-45% in past;   c.  Echo 12/11: EF 35-40%, mild MR, mild LAE, mild RAE, small pericardial effusion   . Osteoarthritis   . Osteoporosis   . Stasis ulcer ([place])   . Uterine prolapse   . Vaginal prolapse without uterine prolapse   . Varicose veins     Past Surgical History:  Procedure Laterality Date  . ANTERIOR AND POSTERIOR VAGINAL REPAIR W/ SACROSPINOUS LIGAMENT SUSPENSION  2016   [facility]  . BLADDER SURGERY    . CARPAL TUNNEL RELEASE Right 08/22/2017   Procedure: CARPAL TUNNEL RELEASE;  Surgeon: [name], MD;  Location: [facility];  Service: Orthopedics;  Laterality: Right;  . cataract surgery Bilateral   . EYE SURGERY     . INGUINAL HERNIA REPAIR     right  . LIPOMA EXCISION     h/o removed from upper back x 2  . REVERSE SHOULDER ARTHROPLASTY Right 08/22/2017   Procedure: REVERSE SHOULDER ARTHROPLASTY;  Surgeon: [name], MD;  Location: [facility];  Service: Orthopedics;  Laterality: Right;    There were no vitals filed for this visit.  Subjective Assessment - 01/26/18 0950    Subjective  Patient arrived with stiffness in shoulder today    Pertinent History  MS; OP.    Limitations  Walking    How long can you stand comfortably?  <10 minutes.    How long can you walk comfortably?  Short distances.    Diagnostic tests  NCV test to right UE.  X-rays    Patient Stated Goals  Move left shoulder with less pain.    Currently in Pain?  Yes    Pain Score  2     Pain Location  Shoulder    Pain Orientation  Right    Pain Descriptors / Indicators  Sore;Constant    Pain Type  Surgical pain    Pain Onset  More than a month ago    Pain  Frequency  Intermittent    Aggravating Factors   overhead movement    Pain Relieving Factors  at rest         [facility] PT Assessment - 01/26/18 0001      AROM   AROM Assessment Site  Shoulder    Right/Left Shoulder  Right    Right Shoulder External Rotation  44 Degrees      PROM   PROM Assessment Site  Shoulder    Right/Left Shoulder  Right    Right Shoulder Flexion  135 Degrees    Right Shoulder External Rotation  61 Degrees                   [facility] Adult PT Treatment/Exercise - 01/26/18 0001      Shoulder Exercises: Supine   Flexion  AAROM;Both;Other (comment);20 reps with cane 10sec holds    Other Supine Exercises  active ceiling punches 3x10    Other Supine Exercises  active flexion to 100 degrees and down to table 2x10      Shoulder Exercises: Standing   Other Standing Exercises  cone stacking waist to shoulder x8      Shoulder Exercises: Pulleys   Flexion  5 minutes      Moist Heat Therapy   Number Minutes Moist Heat  15 Minutes    Moist  Heat Location  Shoulder      Electrical Stimulation   Electrical Stimulation Location  R shoulder    Electrical Stimulation Action  premod    Electrical Stimulation Parameters  80-150hz  x56min    Electrical Stimulation Goals  Pain      Manual Therapy   Manual Therapy  Passive ROM    Manual therapy comments  rhythmic stabs for IR/ER in scaption and flex/ext at 90 degrees    Passive ROM  PROM of R shoulder into flexion, ER, IR with holds at end range               PT Short Term Goals - 12/22/17 1428      PT SHORT TERM GOAL #1   Title  STG's=LTG's.        PT Long Term Goals - 01/19/18 1026      PT LONG TERM GOAL #1   Title  Independent with a HEP.    Baseline  instructed in table slides, isometrics 4 way    Time  4    Period  Weeks    Status  On-going "I do them sometimes"      PT LONG TERM GOAL #2   Title  Active right shoulder flexion to 145 degrees so the patient can easily reach overhead    Time  4    Period  Weeks    Status  On-going PROM 133      PT LONG TERM GOAL #3   Title  Active ER to 70 degrees+ to allow for easily donning/doffing of apparel    Time  4    Period  Weeks    Status  On-going PROM 56 degrees      PT LONG TERM GOAL #4   Title  Increase right shoulder strength to a solid 4/5 to increase stability for performance of functional activities    Time  4    Period  Weeks    Status  On-going NT 01/19/18      PT LONG TERM GOAL #5   Title  Perform ADL's with right shoulder pain not > 3/10.  Period  Weeks    Status  On-going            Plan - 01/26/18 1013    Clinical Impression Statement  Patient tolerated treatment well today. Patient able to perform shoulder flexion in supine with improved shoulder control and less discomfort. Patient able to actively stack cones to shoulder height today about 90 degrees with some difficulty from weakness. Patient improving with ROM and strengthening yet goals ongoing.     Rehab Potential  Fair     PT Frequency  2x / week    PT Duration  8 weeks    PT Treatment/Interventions  ADLs/Self Care Home Management;Cryotherapy;Electrical Stimulation;Ultrasound;Moist Heat;Functional mobility training;Therapeutic activities;Therapeutic exercise;Neuromuscular re-education;Patient/family education;Passive range of motion;Vasopneumatic Device;Manual techniques    PT Next Visit Plan  Cont with POC for AAROM, pulley's, UE ranger/table slides or ball on table/P/AAROM for right shoulder and modalities PRN for pain relief    Consulted and Agree with Plan of Care  Patient       Patient will benefit from skilled therapeutic intervention in order to improve the following deficits and impairments:  Pain, Decreased strength, Impaired UE functional use, Decreased range of motion, Postural dysfunction  Visit Diagnosis: Chronic right shoulder pain  Stiffness of right shoulder, not elsewhere classified  Muscle weakness (generalized)     Problem List Patient Active Problem List   Diagnosis Date Noted  . S/P shoulder replacement, right 08/22/2017  . HTN (hypertension) 02/19/2016  . Post-nasal drip 05/15/2015  . Postoperative abdominal pain 12/07/2014  . Voiding dysfunction 10/07/2014  . SUI (stress urinary incontinence, female) 10/07/2014  . Osteoporosis with fracture 04/06/2014  . Impacted cerumen of both ears 08/20/2013  . Encounter for therapeutic drug monitoring 08/20/2013  . DOE (dyspnea on exertion) 07/23/2013  . Helicobacter positive gastritis 06/21/2013  . Orthostatic hypotension 06/10/2013  . UTI (lower urinary tract infection) 05/06/2013  . Neck mass 02/19/2013  . Neck pain, bilateral 02/19/2013  . Right groin mass 11/08/2011  . Low back pain 11/08/2011  . Lipoma of neck 06/21/2011  . Memory loss 06/21/2011  . OVERACTIVE BLADDER 08/21/2010  . Long term current use of anticoagulant 07/25/2010  . Osteoarthritis 01/18/2010  . KNEE PAIN 01/18/2010  . FOOT PAIN 01/18/2010  .  Hyperlipidemia with target LDL less than 100 04/20/2009  . GERD 04/17/2009  . MITRAL REGURGITATION, MILD 08/30/2008  . NICM (nonischemic cardiomyopathy) ([place]) 08/30/2008  . Congestive heart failure ([place]) 04/13/2007  . DEPRESSION 04/10/2007  . DISEASE, MITRAL VALVE NEC/NOS 04/10/2007  . Atrial fibrillation ([place]) 04/10/2007  . STASIS ULCER 04/10/2007  . VENOUS INSUFFICIENCY, LEGS 04/10/2007    Anokhi Shannon P, PTA 01/26/2018, 10:31 AM  [facility] [place], [place], [address] Phone: [phone]   Fax:  [phone]  Name: MELENA HAYES MRN: [id number] Date of Birth: [birth year]

## 2018-01-28 ENCOUNTER — Encounter: Payer: Self-pay | Admitting: Physical Therapy

## 2018-01-28 ENCOUNTER — Ambulatory Visit: Payer: Medicare Other | Admitting: Physical Therapy

## 2018-01-28 DIAGNOSIS — M25611 Stiffness of right shoulder, not elsewhere classified: Secondary | ICD-10-CM

## 2018-01-28 DIAGNOSIS — G8929 Other chronic pain: Secondary | ICD-10-CM | POA: Diagnosis not present

## 2018-01-28 DIAGNOSIS — R293 Abnormal posture: Secondary | ICD-10-CM | POA: Diagnosis not present

## 2018-01-28 DIAGNOSIS — M6281 Muscle weakness (generalized): Secondary | ICD-10-CM

## 2018-01-28 DIAGNOSIS — M25511 Pain in right shoulder: Principal | ICD-10-CM

## 2018-01-28 DIAGNOSIS — M545 Low back pain: Secondary | ICD-10-CM | POA: Diagnosis not present

## 2018-01-28 NOTE — Therapy (Signed)
[facility] [place], [place], [address] Phone: [phone]   Fax:  [phone]  Physical Therapy Treatment  Patient Details  Name: Virginia Young MRN: [id number] Date of Birth: March 13, 1941 Referring Provider: Esmond Plants MD.   Encounter Date: 01/28/2018  PT End of Session - 01/28/18 1014    Visit Number  16    Number of Visits  22    Date for PT Re-Evaluation  03/06/18    Authorization Type  10TH VISIT PROGRESS NOTE AND KX MODIFIER AFTER THE 15 VISIT.    PT Start Time  0945    PT Stop Time  1031    PT Time Calculation (min)  46 min    Activity Tolerance  Patient tolerated treatment well    Behavior During Therapy  WFL for tasks assessed/performed       Past Medical History:  Diagnosis Date  . Allergy   . Atrial fibrillation (HCC)    a. coumadin;  b. Amiodarone  . Cataract   . Chest pain 03/2012   a. Lex MV 10/13:  EF 64%, dist ant and apical defect sugg of soft tissue atten, no ischemia  . Chronic systolic heart failure ([place])   . Depression   . Dysrhythmia   . GERD (gastroesophageal reflux disease)   . HLD (hyperlipidemia)   . Incontinence of urine   . Mild mitral regurgitation   . MS (multiple sclerosis) ([place])   . NICM (nonischemic cardiomyopathy) ([place])    a. neg CLite in 2003;  b. EF 40-45% in past;   c.  Echo 12/11: EF 35-40%, mild MR, mild LAE, mild RAE, small pericardial effusion   . Osteoarthritis   . Osteoporosis   . Stasis ulcer ([place])   . Uterine prolapse   . Vaginal prolapse without uterine prolapse   . Varicose veins     Past Surgical History:  Procedure Laterality Date  . ANTERIOR AND POSTERIOR VAGINAL REPAIR W/ SACROSPINOUS LIGAMENT SUSPENSION  2016   [facility]  . BLADDER SURGERY    . CARPAL TUNNEL RELEASE Right 08/22/2017   Procedure: CARPAL TUNNEL RELEASE;  Surgeon: [name], MD;  Location: [facility];  Service: Orthopedics;  Laterality: Right;  . cataract surgery Bilateral   . EYE SURGERY     . INGUINAL HERNIA REPAIR     right  . LIPOMA EXCISION     h/o removed from upper back x 2  . REVERSE SHOULDER ARTHROPLASTY Right 08/22/2017   Procedure: REVERSE SHOULDER ARTHROPLASTY;  Surgeon: [name], MD;  Location: [facility];  Service: Orthopedics;  Laterality: Right;    There were no vitals filed for this visit.  Subjective Assessment - 01/28/18 0950    Subjective  No complaints after last treatment, some ongoing soreness reported    Pertinent History  MS; OP.    Limitations  Walking    How long can you stand comfortably?  <10 minutes.    How long can you walk comfortably?  Short distances.    Diagnostic tests  NCV test to right UE.  X-rays    Patient Stated Goals  Move left shoulder with less pain.    Currently in Pain?  Yes    Pain Score  2     Pain Location  Shoulder    Pain Orientation  Right    Pain Descriptors / Indicators  Sore    Pain Onset  More than a month ago    Pain Frequency  Intermittent  Aggravating Factors   overhead movement    Pain Relieving Factors  at rest                       [facility] Adult PT Treatment/Exercise - 01/28/18 0001      Shoulder Exercises: Supine   Other Supine Exercises  active ceiling punches 3x10    Other Supine Exercises  active flexion to 100 degrees and down to table 2x10      Shoulder Exercises: Seated   Row  Strengthening;Right;20 reps;Theraband;Limitations;Other (comment)    Theraband Level (Shoulder Row)  Level 1 (Yellow)    Row Limitations  half range    Protraction  Strengthening;Right;10 reps;Theraband;Limitations    Theraband Level (Shoulder Protraction)  Level 1 (Yellow)    Protraction Limitations  half range difficulty    External Rotation  Strengthening;Right;Limitations;Other (comment)    External Rotation Limitations  unable    Internal Rotation  Strengthening;Right;20 reps;Theraband;Limitations    Theraband Level (Shoulder Internal Rotation)  Level 1 (Yellow)      Shoulder Exercises: Standing    Other Standing Exercises  cone stacking waist to shoulder x8      Shoulder Exercises: Pulleys   Flexion  5 minutes      Moist Heat Therapy   Number Minutes Moist Heat  15 Minutes    Moist Heat Location  Shoulder      Electrical Stimulation   Electrical Stimulation Location  R shoulder    Electrical Stimulation Action  premod    Electrical Stimulation Parameters  80-150hz  x13min    Electrical Stimulation Goals  Pain      Manual Therapy   Manual Therapy  Passive ROM    Manual therapy comments  rhythmic stabs for IR/ER in scaption and flex/ext at 90 degrees    Passive ROM  PROM of R shoulder into flexion, ER, IR with holds at end range               PT Short Term Goals - 12/22/17 1428      PT SHORT TERM GOAL #1   Title  STG's=LTG's.        PT Long Term Goals - 01/19/18 1026      PT LONG TERM GOAL #1   Title  Independent with a HEP.    Baseline  instructed in table slides, isometrics 4 way    Time  4    Period  Weeks    Status  On-going "I do them sometimes"      PT LONG TERM GOAL #2   Title  Active right shoulder flexion to 145 degrees so the patient can easily reach overhead    Time  4    Period  Weeks    Status  On-going PROM 133      PT LONG TERM GOAL #3   Title  Active ER to 70 degrees+ to allow for easily donning/doffing of apparel    Time  4    Period  Weeks    Status  On-going PROM 56 degrees      PT LONG TERM GOAL #4   Title  Increase right shoulder strength to a solid 4/5 to increase stability for performance of functional activities    Time  4    Period  Weeks    Status  On-going NT 01/19/18      PT LONG TERM GOAL #5   Title  Perform ADL's with right shoulder pain not > 3/10.  Period  Weeks    Status  On-going            Plan - 01/28/18 1015    Clinical Impression Statement  Patient tolerated treatment well today. Patient able to progress with strenthening activities today. Patient has more weakness for right shoulder flexion  and ER than ext and IR. Patient goals progressing.     Rehab Potential  Fair    PT Frequency  2x / week    PT Duration  8 weeks    PT Treatment/Interventions  ADLs/Self Care Home Management;Cryotherapy;Electrical Stimulation;Ultrasound;Moist Heat;Functional mobility training;Therapeutic activities;Therapeutic exercise;Neuromuscular re-education;Patient/family education;Passive range of motion;Vasopneumatic Device;Manual techniques    PT Next Visit Plan  Cont with POC for ROM and PRE slowly and modalities PRN for pain relief    Consulted and Agree with Plan of Care  Patient       Patient will benefit from skilled therapeutic intervention in order to improve the following deficits and impairments:  Pain, Decreased strength, Impaired UE functional use, Decreased range of motion, Postural dysfunction  Visit Diagnosis: Chronic right shoulder pain  Stiffness of right shoulder, not elsewhere classified  Muscle weakness (generalized)     Problem List Patient Active Problem List   Diagnosis Date Noted  . S/P shoulder replacement, right 08/22/2017  . HTN (hypertension) 02/19/2016  . Post-nasal drip 05/15/2015  . Postoperative abdominal pain 12/07/2014  . Voiding dysfunction 10/07/2014  . SUI (stress urinary incontinence, female) 10/07/2014  . Osteoporosis with fracture 04/06/2014  . Impacted cerumen of both ears 08/20/2013  . Encounter for therapeutic drug monitoring 08/20/2013  . DOE (dyspnea on exertion) 07/23/2013  . Helicobacter positive gastritis 06/21/2013  . Orthostatic hypotension 06/10/2013  . UTI (lower urinary tract infection) 05/06/2013  . Neck mass 02/19/2013  . Neck pain, bilateral 02/19/2013  . Right groin mass 11/08/2011  . Low back pain 11/08/2011  . Lipoma of neck 06/21/2011  . Memory loss 06/21/2011  . OVERACTIVE BLADDER 08/21/2010  . Long term current use of anticoagulant 07/25/2010  . Osteoarthritis 01/18/2010  . KNEE PAIN 01/18/2010  . FOOT PAIN 01/18/2010   . Hyperlipidemia with target LDL less than 100 04/20/2009  . GERD 04/17/2009  . MITRAL REGURGITATION, MILD 08/30/2008  . NICM (nonischemic cardiomyopathy) ([place]) 08/30/2008  . Congestive heart failure ([place]) 04/13/2007  . DEPRESSION 04/10/2007  . DISEASE, MITRAL VALVE NEC/NOS 04/10/2007  . Atrial fibrillation ([place]) 04/10/2007  . STASIS ULCER 04/10/2007  . VENOUS INSUFFICIENCY, LEGS 04/10/2007    Decoda Van P, PTA 01/28/2018, 10:38 AM  [facility] [place], [place], [address] Phone: [phone]   Fax:  [phone]  Name: Virginia Young MRN: [id number] Date of Birth: 11/01/1940

## 2018-01-30 ENCOUNTER — Ambulatory Visit: Payer: Medicare Other | Admitting: *Deleted

## 2018-01-30 DIAGNOSIS — G8929 Other chronic pain: Secondary | ICD-10-CM | POA: Diagnosis not present

## 2018-01-30 DIAGNOSIS — M545 Low back pain: Secondary | ICD-10-CM | POA: Diagnosis not present

## 2018-01-30 DIAGNOSIS — M25511 Pain in right shoulder: Secondary | ICD-10-CM | POA: Diagnosis not present

## 2018-01-30 DIAGNOSIS — M25611 Stiffness of right shoulder, not elsewhere classified: Secondary | ICD-10-CM | POA: Diagnosis not present

## 2018-01-30 DIAGNOSIS — M6281 Muscle weakness (generalized): Secondary | ICD-10-CM

## 2018-01-30 DIAGNOSIS — R293 Abnormal posture: Secondary | ICD-10-CM | POA: Diagnosis not present

## 2018-01-30 NOTE — Therapy (Signed)
[facility] [place], [place], [address] Phone: [phone]   Fax:  [phone]  Physical Therapy Treatment  Patient Details  Name: Virginia Young MRN: [id number] Date of Birth: 04-03-1941 Referring Provider: Esmond Plants MD.   Encounter Date: 01/30/2018  PT End of Session - 01/30/18 0946    Visit Number  17    Number of Visits  22    Date for PT Re-Evaluation  03/06/18    Authorization Type  10TH VISIT PROGRESS NOTE AND KX MODIFIER AFTER THE 15 VISIT.    PT Start Time  0945    PT Stop Time  1035    PT Time Calculation (min)  50 min       Past Medical History:  Diagnosis Date  . Allergy   . Atrial fibrillation (HCC)    a. coumadin;  b. Amiodarone  . Cataract   . Chest pain 03/2012   a. Lex MV 10/13:  EF 64%, dist ant and apical defect sugg of soft tissue atten, no ischemia  . Chronic systolic heart failure ([place])   . Depression   . Dysrhythmia   . GERD (gastroesophageal reflux disease)   . HLD (hyperlipidemia)   . Incontinence of urine   . Mild mitral regurgitation   . MS (multiple sclerosis) ([place])   . NICM (nonischemic cardiomyopathy) ([place])    a. neg CLite in 2003;  b. EF 40-45% in past;   c.  Echo 12/11: EF 35-40%, mild MR, mild LAE, mild RAE, small pericardial effusion   . Osteoarthritis   . Osteoporosis   . Stasis ulcer ([place])   . Uterine prolapse   . Vaginal prolapse without uterine prolapse   . Varicose veins     Past Surgical History:  Procedure Laterality Date  . ANTERIOR AND POSTERIOR VAGINAL REPAIR W/ SACROSPINOUS LIGAMENT SUSPENSION  2016   [facility]  . BLADDER SURGERY    . CARPAL TUNNEL RELEASE Right 08/22/2017   Procedure: CARPAL TUNNEL RELEASE;  Surgeon: [name], MD;  Location: [facility];  Service: Orthopedics;  Laterality: Right;  . cataract surgery Bilateral   . EYE SURGERY    . INGUINAL HERNIA REPAIR     right  . LIPOMA EXCISION     h/o removed from upper back x 2  . REVERSE SHOULDER  ARTHROPLASTY Right 08/22/2017   Procedure: REVERSE SHOULDER ARTHROPLASTY;  Surgeon: [name], MD;  Location: [facility];  Service: Orthopedics;  Laterality: Right;    There were no vitals filed for this visit.  Subjective Assessment - 01/30/18 0945    Subjective  No complaints after last treatment, some ongoing soreness reported    Pertinent History  MS; OP.    Limitations  Walking    How long can you stand comfortably?  <10 minutes.    How long can you walk comfortably?  Short distances.    Diagnostic tests  NCV test to right UE.  X-rays    Patient Stated Goals  Move left shoulder with less pain.    Currently in Pain?  Yes    Pain Score  2     Pain Location  Shoulder    Pain Orientation  Right    Pain Descriptors / Indicators  Sore    Pain Type  Surgical pain    Pain Onset  More than a month ago    Pain Frequency  Intermittent  [facility] Adult PT Treatment/Exercise - 01/30/18 0001      Shoulder Exercises: Supine   Other Supine Exercises  active ceiling punches 3x10    Other Supine Exercises  active flexion to 100 degrees and down to table 3x10 with cues to slow down and control the lowering part of the ex      Shoulder Exercises: Seated   Row  Strengthening;Right;20 reps;Theraband;Limitations;Other (comment)    Theraband Level (Shoulder Row)  Level 1 (Yellow)    Row Limitations  --    Internal Rotation  Strengthening;Right;20 reps;Theraband;Limitations    Theraband Level (Shoulder Internal Rotation)  Level 1 (Yellow)    Other Seated Exercises  shldr extension yellow 3x10      Shoulder Exercises: Standing   Other Standing Exercises  cone stacking waist to shoulder x8 with 5 cones      Shoulder Exercises: Pulleys   Flexion  5 minutes      Moist Heat Therapy   Number Minutes Moist Heat  15 Minutes    Moist Heat Location  Shoulder      Electrical Stimulation   Electrical Stimulation Location  R shoulder premod 80-150hz  x 15 mins     Electrical Stimulation Goals  Pain      Manual Therapy   Manual Therapy  Passive ROM    Manual therapy comments  rhythmic stabs for IR/ER in scaption and flex/ext at 90, 100 degrees degrees    Passive ROM  PROM of R shoulder into flexion, ER, IR with holds at end range               PT Short Term Goals - 12/22/17 1428      PT SHORT TERM GOAL #1   Title  STG's=LTG's.        PT Long Term Goals - 01/19/18 1026      PT LONG TERM GOAL #1   Title  Independent with a HEP.    Baseline  instructed in table slides, isometrics 4 way    Time  4    Period  Weeks    Status  On-going   "I do them sometimes"     PT LONG TERM GOAL #2   Title  Active right shoulder flexion to 145 degrees so the patient can easily reach overhead    Time  4    Period  Weeks    Status  On-going   PROM 133     PT LONG TERM GOAL #3   Title  Active ER to 70 degrees+ to allow for easily donning/doffing of apparel    Time  4    Period  Weeks    Status  On-going   PROM 56 degrees     PT LONG TERM GOAL #4   Title  Increase right shoulder strength to a solid 4/5 to increase stability for performance of functional activities    Time  4    Period  Weeks    Status  On-going   NT 01/19/18     PT LONG TERM GOAL #5   Title  Perform ADL's with right shoulder pain not > 3/10.    Period  Weeks    Status  On-going            Plan - 01/30/18 0954    Clinical Impression Statement  Pt arrived today doing fairly well with minimal pain RT shldr. She was able to complete all AROM and light strengthening exs without complaints, but needs verbal/tactile cue for  technique and  to slow down.. Pt still with shldr hiking when trying to elevate above 90 degrees in standing, butsupine to 130 degrees actively. She did well with supine exs and rhythmic stab for flexion, ER/IR. Normal modality response today.    Rehab Potential  Fair    PT Frequency  2x / week    PT Duration  8 weeks    PT Treatment/Interventions   ADLs/Self Care Home Management;Cryotherapy;Electrical Stimulation;Ultrasound;Moist Heat;Functional mobility training;Therapeutic activities;Therapeutic exercise;Neuromuscular re-education;Patient/family education;Passive range of motion;Vasopneumatic Device;Manual techniques    PT Next Visit Plan  Cont with POC for ROM and PRE slowly and modalities PRN for pain relief    Consulted and Agree with Plan of Care  Patient       Patient will benefit from skilled therapeutic intervention in order to improve the following deficits and impairments:  Pain, Decreased strength, Impaired UE functional use, Decreased range of motion, Postural dysfunction  Visit Diagnosis: Chronic right shoulder pain  Stiffness of right shoulder, not elsewhere classified  Muscle weakness (generalized)     Problem List Patient Active Problem List   Diagnosis Date Noted  . S/P shoulder replacement, right 08/22/2017  . HTN (hypertension) 02/19/2016  . Post-nasal drip 05/15/2015  . Postoperative abdominal pain 12/07/2014  . Voiding dysfunction 10/07/2014  . SUI (stress urinary incontinence, female) 10/07/2014  . Osteoporosis with fracture 04/06/2014  . Impacted cerumen of both ears 08/20/2013  . Encounter for therapeutic drug monitoring 08/20/2013  . DOE (dyspnea on exertion) 07/23/2013  . Helicobacter positive gastritis 06/21/2013  . Orthostatic hypotension 06/10/2013  . UTI (lower urinary tract infection) 05/06/2013  . Neck mass 02/19/2013  . Neck pain, bilateral 02/19/2013  . Right groin mass 11/08/2011  . Low back pain 11/08/2011  . Lipoma of neck 06/21/2011  . Memory loss 06/21/2011  . OVERACTIVE BLADDER 08/21/2010  . Long term current use of anticoagulant 07/25/2010  . Osteoarthritis 01/18/2010  . KNEE PAIN 01/18/2010  . FOOT PAIN 01/18/2010  . Hyperlipidemia with target LDL less than 100 04/20/2009  . GERD 04/17/2009  . MITRAL REGURGITATION, MILD 08/30/2008  . NICM (nonischemic cardiomyopathy)  ([place]) 08/30/2008  . Congestive heart failure ([place]) 04/13/2007  . DEPRESSION 04/10/2007  . DISEASE, MITRAL VALVE NEC/NOS 04/10/2007  . Atrial fibrillation ([place]) 04/10/2007  . STASIS ULCER 04/10/2007  . VENOUS INSUFFICIENCY, LEGS 04/10/2007    Doxie Augenstein,CHRIS, PTA 01/30/2018, 10:52 AM  [facility] [place], [place], [address] Phone: [phone]   Fax:  [phone]  Name: Virginia Young MRN: [id number] Date of Birth: June 13, 1941

## 2018-02-02 ENCOUNTER — Encounter: Payer: Self-pay | Admitting: Pediatrics

## 2018-02-02 ENCOUNTER — Telehealth: Payer: Self-pay | Admitting: Pediatrics

## 2018-02-02 ENCOUNTER — Ambulatory Visit (INDEPENDENT_AMBULATORY_CARE_PROVIDER_SITE_OTHER): Payer: Medicare Other | Admitting: Pediatrics

## 2018-02-02 VITALS — BP 128/88 | HR 93 | Temp 96.9°F | Ht 70.0 in | Wt 175.0 lb

## 2018-02-02 DIAGNOSIS — N3090 Cystitis, unspecified without hematuria: Secondary | ICD-10-CM | POA: Diagnosis not present

## 2018-02-02 DIAGNOSIS — M25551 Pain in right hip: Secondary | ICD-10-CM | POA: Diagnosis not present

## 2018-02-02 DIAGNOSIS — R399 Unspecified symptoms and signs involving the genitourinary system: Secondary | ICD-10-CM

## 2018-02-02 DIAGNOSIS — R413 Other amnesia: Secondary | ICD-10-CM | POA: Diagnosis not present

## 2018-02-02 DIAGNOSIS — R443 Hallucinations, unspecified: Secondary | ICD-10-CM

## 2018-02-02 MED ORDER — NITROFURANTOIN MONOHYD MACRO 100 MG PO CAPS: 100.0000 mg | ORAL_CAPSULE | Freq: Two times a day (BID) | ORAL | 0 refills | Status: AC
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
## 2018-02-02 NOTE — Addendum Note (Signed)
Addended by: Liliane Bade on: 02/02/2018 04:58 PM   Modules accepted: Orders
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
## 2018-02-02 NOTE — Telephone Encounter (Signed)
Returned call, going to see me today at 215. Grandson to bring her.

## 2018-02-03 LAB — FOLATE: Folate: 15.7 ng/mL (ref >3.0)

## 2018-02-03 LAB — CBC WITH DIFFERENTIAL/PLATELET
Basophils Absolute: 0.1 10*3/uL (ref 0.0–0.2)
Basos: 1 %
EOS (ABSOLUTE): 0.3 10*3/uL (ref 0.0–0.4)
Eos: 3 %
Hematocrit: 44.2 % (ref 34.0–46.6)
Hemoglobin: 14.6 g/dL (ref 11.1–15.9)
Immature Grans (Abs): 0 10*3/uL (ref 0.0–0.1)
Immature Granulocytes: 0 %
Lymphocytes Absolute: 2.4 10*3/uL (ref 0.7–3.1)
Lymphs: 29 %
MCH: 28.3 pg (ref 26.6–33.0)
MCHC: 33 g/dL (ref 31.5–35.7)
MCV: 86 fL (ref 79–97)
Monocytes Absolute: 0.8 10*3/uL (ref 0.1–0.9)
Monocytes: 10 %
Neutrophils Absolute: 4.7 10*3/uL (ref 1.4–7.0)
Neutrophils: 57 %
Platelets: 242 10*3/uL (ref 150–450)
RBC: 5.15 x10E6/uL (ref 3.77–5.28)
RDW: 15 % (ref 12.3–15.4)
WBC: 8.3 10*3/uL (ref 3.4–10.8)

## 2018-02-03 LAB — CMP14+EGFR
ALT: 17 IU/L (ref 0–32)
AST: 20 IU/L (ref 0–40)
Albumin/Globulin Ratio: 1.5 (ref 1.2–2.2)
Albumin: 4 g/dL (ref 3.5–4.8)
Alkaline Phosphatase: 143 IU/L — ABNORMAL HIGH (ref 39–117)
BUN/Creatinine Ratio: 14 (ref 12–28)
BUN: 14 mg/dL (ref 8–27)
Bilirubin Total: 0.5 mg/dL (ref 0.0–1.2)
CO2: 26 mmol/L (ref 20–29)
Calcium: 9.3 mg/dL (ref 8.7–10.3)
Chloride: 99 mmol/L (ref 96–106)
Creatinine, Ser: 1 mg/dL (ref 0.57–1.00)
GFR calc Af Amer: 62 mL/min/{1.73_m2} (ref >59)
GFR calc non Af Amer: 54 mL/min/{1.73_m2} — ABNORMAL LOW (ref >59)
Globulin, Total: 2.6 g/dL (ref 1.5–4.5)
Glucose: 79 mg/dL (ref 65–99)
Potassium: 4.5 mmol/L (ref 3.5–5.2)
Sodium: 139 mmol/L (ref 134–144)
Total Protein: 6.6 g/dL (ref 6.0–8.5)

## 2018-02-03 LAB — TSH: TSH: 1.42 u[IU]/mL (ref 0.450–4.500)

## 2018-02-03 LAB — VITAMIN B12: Vitamin B-12: 644 pg/mL (ref 232–1245)

## 2018-02-04 ENCOUNTER — Ambulatory Visit: Payer: Medicare Other | Admitting: Physical Therapy

## 2018-02-04 ENCOUNTER — Encounter: Payer: Self-pay | Admitting: Physical Therapy

## 2018-02-04 DIAGNOSIS — G8929 Other chronic pain: Secondary | ICD-10-CM | POA: Diagnosis not present

## 2018-02-04 DIAGNOSIS — M545 Low back pain: Secondary | ICD-10-CM | POA: Diagnosis not present

## 2018-02-04 DIAGNOSIS — M6281 Muscle weakness (generalized): Secondary | ICD-10-CM | POA: Diagnosis not present

## 2018-02-04 DIAGNOSIS — M25511 Pain in right shoulder: Secondary | ICD-10-CM | POA: Diagnosis not present

## 2018-02-04 DIAGNOSIS — M25611 Stiffness of right shoulder, not elsewhere classified: Secondary | ICD-10-CM | POA: Diagnosis not present

## 2018-02-04 DIAGNOSIS — R293 Abnormal posture: Secondary | ICD-10-CM | POA: Diagnosis not present

## 2018-02-04 LAB — URINE CULTURE

## 2018-02-04 NOTE — Therapy (Signed)
[facility] [place], [place], [address] Phone: [phone]   Fax:  [phone]  Physical Therapy Treatment  Patient Details  Name: Virginia Young MRN: [id number] Date of Birth: 05-14-1941 Referring Provider: Esmond Plants MD.   Encounter Date: 02/04/2018  PT End of Session - 02/04/18 1413    Visit Number  18    Number of Visits  22    Date for PT Re-Evaluation  03/06/18    Authorization Type  10TH VISIT PROGRESS NOTE AND KX MODIFIER AFTER THE 15 VISIT.    PT Start Time  0945    PT Stop Time  1038    PT Time Calculation (min)  53 min       Past Medical History:  Diagnosis Date  . Allergy   . Atrial fibrillation (HCC)    a. coumadin;  b. Amiodarone  . Cataract   . Chest pain 03/2012   a. Lex MV 10/13:  EF 64%, dist ant and apical defect sugg of soft tissue atten, no ischemia  . Chronic systolic heart failure ([place])   . Depression   . Dysrhythmia   . GERD (gastroesophageal reflux disease)   . HLD (hyperlipidemia)   . Incontinence of urine   . Mild mitral regurgitation   . MS (multiple sclerosis) ([place])   . NICM (nonischemic cardiomyopathy) ([place])    a. neg CLite in 2003;  b. EF 40-45% in past;   c.  Echo 12/11: EF 35-40%, mild MR, mild LAE, mild RAE, small pericardial effusion   . Osteoarthritis   . Osteoporosis   . Stasis ulcer ([place])   . Uterine prolapse   . Vaginal prolapse without uterine prolapse   . Varicose veins     Past Surgical History:  Procedure Laterality Date  . ANTERIOR AND POSTERIOR VAGINAL REPAIR W/ SACROSPINOUS LIGAMENT SUSPENSION  2016   [facility]  . BLADDER SURGERY    . CARPAL TUNNEL RELEASE Right 08/22/2017   Procedure: CARPAL TUNNEL RELEASE;  Surgeon: [name], MD;  Location: [place];  Service: Orthopedics;  Laterality: Right;  . cataract surgery Bilateral   . EYE SURGERY    . INGUINAL HERNIA REPAIR     right  . LIPOMA EXCISION     h/o removed from upper back x 2  . REVERSE SHOULDER  ARTHROPLASTY Right 08/22/2017   Procedure: REVERSE SHOULDER ARTHROPLASTY;  Surgeon: [name], MD;  Location: [place];  Service: Orthopedics;  Laterality: Right;    There were no vitals filed for this visit.  Subjective Assessment - 02/04/18 1414    Subjective  I had blood taken and I'm hurting more today.    Currently in Pain?  Yes    Pain Score  4     Pain Location  Shoulder    Pain Orientation  Right    Pain Descriptors / Indicators  Sore    Pain Onset  More than a month ago                       [facility] Adult PT Treatment/Exercise - 02/04/18 0001      Shoulder Exercises: Pulleys   Flexion Limitations  62minutes.      Moist Heat Therapy   Number Minutes Moist Heat  20 Minutes    Moist Heat Location  --   Right shoulder.     Acupuncturist Location  Right shoulder.    Electrical Stimulation Action  IFC  Electrical Stimulation Parameters  80-150 Hz x 20 minutes.    Electrical Stimulation Goals  Pain      Manual Therapy   Manual Therapy  Passive ROM    Passive ROM  In supine:  Right shoulder PAROM, AAROM punches, rhy stabs, active ER in the plane of scpaula to fatigue x 2 (17 minutes total).               PT Short Term Goals - 12/22/17 1428      PT SHORT TERM GOAL #1   Title  STG's=LTG's.        PT Long Term Goals - 01/19/18 1026      PT LONG TERM GOAL #1   Title  Independent with a HEP.    Baseline  instructed in table slides, isometrics 4 way    Time  4    Period  Weeks    Status  On-going   "I do them sometimes"     PT LONG TERM GOAL #2   Title  Active right shoulder flexion to 145 degrees so the patient can easily reach overhead    Time  4    Period  Weeks    Status  On-going   PROM 133     PT LONG TERM GOAL #3   Title  Active ER to 70 degrees+ to allow for easily donning/doffing of apparel    Time  4    Period  Weeks    Status  On-going   PROM 56 degrees     PT LONG TERM GOAL #4   Title   Increase right shoulder strength to a solid 4/5 to increase stability for performance of functional activities    Time  4    Period  Weeks    Status  On-going   NT 01/19/18     PT LONG TERM GOAL #5   Title  Perform ADL's with right shoulder pain not > 3/10.    Period  Weeks    Status  On-going            Plan - 02/04/18 1418    Clinical Impression Statement  Patient did well today though she reported increased pain today due to having blood drawn. She did well with supine exercises today and could perform an antigravity right shoulder punch.    PT Treatment/Interventions  ADLs/Self Care Home Management;Cryotherapy;Electrical Stimulation;Ultrasound;Moist Heat;Functional mobility training;Therapeutic activities;Therapeutic exercise;Neuromuscular re-education;Patient/family education;Passive range of motion;Vasopneumatic Device;Manual techniques    PT Next Visit Plan  Cont with POC for ROM and PRE slowly and modalities PRN for pain relief    Consulted and Agree with Plan of Care  Patient       Patient will benefit from skilled therapeutic intervention in order to improve the following deficits and impairments:  Pain, Decreased strength, Impaired UE functional use, Decreased range of motion, Postural dysfunction  Visit Diagnosis: Chronic right shoulder pain  Stiffness of right shoulder, not elsewhere classified  Muscle weakness (generalized)     Problem List Patient Active Problem List   Diagnosis Date Noted  . S/P shoulder replacement, right 08/22/2017  . HTN (hypertension) 02/19/2016  . Post-nasal drip 05/15/2015  . Postoperative abdominal pain 12/07/2014  . Voiding dysfunction 10/07/2014  . SUI (stress urinary incontinence, female) 10/07/2014  . Osteoporosis with fracture 04/06/2014  . Impacted cerumen of both ears 08/20/2013  . Encounter for therapeutic drug monitoring 08/20/2013  . DOE (dyspnea on exertion) 07/23/2013  . Helicobacter positive gastritis 06/21/2013   .  Orthostatic hypotension 06/10/2013  . UTI (lower urinary tract infection) 05/06/2013  . Neck mass 02/19/2013  . Neck pain, bilateral 02/19/2013  . Right groin mass 11/08/2011  . Low back pain 11/08/2011  . Lipoma of neck 06/21/2011  . Memory loss 06/21/2011  . OVERACTIVE BLADDER 08/21/2010  . Long term current use of anticoagulant 07/25/2010  . Osteoarthritis 01/18/2010  . KNEE PAIN 01/18/2010  . FOOT PAIN 01/18/2010  . Hyperlipidemia with target LDL less than 100 04/20/2009  . GERD 04/17/2009  . MITRAL REGURGITATION, MILD 08/30/2008  . NICM (nonischemic cardiomyopathy) ([place]) 08/30/2008  . Congestive heart failure ([place]) 04/13/2007  . DEPRESSION 04/10/2007  . DISEASE, MITRAL VALVE NEC/NOS 04/10/2007  . Atrial fibrillation ([place]) 04/10/2007  . STASIS ULCER 04/10/2007  . VENOUS INSUFFICIENCY, LEGS 04/10/2007    Louvina Cleary, [place] MPT 02/04/2018, 2:20 PM  [facility] [place], [place], [address] Phone: [phone]   Fax:  [phone]  Name: Virginia Young MRN: [id number] Date of Birth: 24-Dec-1940

## 2018-02-06 ENCOUNTER — Ambulatory Visit: Payer: Medicare Other | Admitting: Physical Therapy

## 2018-02-06 ENCOUNTER — Encounter: Payer: Self-pay | Admitting: Physical Therapy

## 2018-02-06 DIAGNOSIS — M25511 Pain in right shoulder: Secondary | ICD-10-CM | POA: Diagnosis not present

## 2018-02-06 DIAGNOSIS — M25611 Stiffness of right shoulder, not elsewhere classified: Secondary | ICD-10-CM | POA: Diagnosis not present

## 2018-02-06 DIAGNOSIS — G8929 Other chronic pain: Secondary | ICD-10-CM | POA: Diagnosis not present

## 2018-02-06 DIAGNOSIS — M6281 Muscle weakness (generalized): Secondary | ICD-10-CM | POA: Diagnosis not present

## 2018-02-06 DIAGNOSIS — R293 Abnormal posture: Secondary | ICD-10-CM | POA: Diagnosis not present

## 2018-02-06 DIAGNOSIS — M545 Low back pain: Secondary | ICD-10-CM | POA: Diagnosis not present

## 2018-02-06 NOTE — Therapy (Signed)
[facility] [place], [place], [address] Phone: [phone]   Fax:  [phone]  Physical Therapy Treatment  Patient Details  Name: Virginia Young MRN: [id number] Date of Birth: 02-07-1941 Referring Provider: Esmond Plants MD.   Encounter Date: 02/06/2018  PT End of Session - 02/06/18 0951    Visit Number  19    Number of Visits  22    Date for PT Re-Evaluation  03/06/18    Authorization Type  10TH VISIT PROGRESS NOTE AND KX MODIFIER AFTER THE 15 VISIT.    PT Start Time  0945    PT Stop Time  1042    PT Time Calculation (min)  57 min    Activity Tolerance  Patient tolerated treatment well    Behavior During Therapy  WFL for tasks assessed/performed       Past Medical History:  Diagnosis Date  . Allergy   . Atrial fibrillation (HCC)    a. coumadin;  b. Amiodarone  . Cataract   . Chest pain 03/2012   a. Lex MV 10/13:  EF 64%, dist ant and apical defect sugg of soft tissue atten, no ischemia  . Chronic systolic heart failure ([place])   . Depression   . Dysrhythmia   . GERD (gastroesophageal reflux disease)   . HLD (hyperlipidemia)   . Incontinence of urine   . Mild mitral regurgitation   . MS (multiple sclerosis) ([place])   . NICM (nonischemic cardiomyopathy) ([place])    a. neg CLite in 2003;  b. EF 40-45% in past;   c.  Echo 12/11: EF 35-40%, mild MR, mild LAE, mild RAE, small pericardial effusion   . Osteoarthritis   . Osteoporosis   . Stasis ulcer ([place])   . Uterine prolapse   . Vaginal prolapse without uterine prolapse   . Varicose veins     Past Surgical History:  Procedure Laterality Date  . ANTERIOR AND POSTERIOR VAGINAL REPAIR W/ SACROSPINOUS LIGAMENT SUSPENSION  2016   [facility]  . BLADDER SURGERY    . CARPAL TUNNEL RELEASE Right 08/22/2017   Procedure: CARPAL TUNNEL RELEASE;  Surgeon: [name], MD;  Location: [facility];  Service: Orthopedics;  Laterality: Right;  . cataract surgery Bilateral   . EYE  SURGERY    . INGUINAL HERNIA REPAIR     right  . LIPOMA EXCISION     h/o removed from upper back x 2  . REVERSE SHOULDER ARTHROPLASTY Right 08/22/2017   Procedure: REVERSE SHOULDER ARTHROPLASTY;  Surgeon: [name], MD;  Location: [facility];  Service: Orthopedics;  Laterality: Right;    There were no vitals filed for this visit.  Subjective Assessment - 02/06/18 1037    Subjective  Patient reports minimal pain today.    Pertinent History  MS; OP.    Limitations  Walking    How long can you stand comfortably?  <10 minutes.    How long can you walk comfortably?  Short distances.    Diagnostic tests  NCV test to right UE.  X-rays    Patient Stated Goals  Move left shoulder with less pain.    Currently in Pain?  Yes    Pain Score  2     Pain Location  Shoulder    Pain Orientation  Right    Pain Descriptors / Indicators  Sore    Pain Type  Surgical pain    Pain Onset  More than a month ago    Pain Frequency  Intermittent         [facility] PT Assessment - 02/06/18 0001      Assessment   Medical Diagnosis  S/p right reverse total shoulder.                   [facility] Adult PT Treatment/Exercise - 02/06/18 0001      Exercises   Exercises  Shoulder      Shoulder Exercises: Supine   Protraction  AAROM;Both;Other (comment)   x 2 minutes   Flexion  AAROM;Both;Other (comment);20 reps   x2 minutes   Other Supine Exercises  ABCs x1      Shoulder Exercises: Seated   Row  Strengthening;Right;20 reps;Theraband;Limitations;Other (comment)    Theraband Level (Shoulder Row)  Level 1 (Yellow)    External Rotation  Right;20 reps;AROM    Other Seated Exercises  shldr extension yellow 3x10      Shoulder Exercises: Pulleys   Flexion  5 minutes      Moist Heat Therapy   Number Minutes Moist Heat  15 Minutes    Moist Heat Location  Shoulder      Electrical Stimulation   Electrical Stimulation Location  right shoulder    Electrical Stimulation Action  IFC    Electrical  Stimulation Parameters  80-150 hz x15 min    Electrical Stimulation Goals  Pain      Manual Therapy   Manual Therapy  Passive ROM    Passive ROM  Right shoulder PROM into flexion, ER with gentle overpressure to improve ROM.               PT Short Term Goals - 12/22/17 1428      PT SHORT TERM GOAL #1   Title  STG's=LTG's.        PT Long Term Goals - 01/19/18 1026      PT LONG TERM GOAL #1   Title  Independent with a HEP.    Baseline  instructed in table slides, isometrics 4 way    Time  4    Period  Weeks    Status  On-going   "I do them sometimes"     PT LONG TERM GOAL #2   Title  Active right shoulder flexion to 145 degrees so the patient can easily reach overhead    Time  4    Period  Weeks    Status  On-going   PROM 133     PT LONG TERM GOAL #3   Title  Active ER to 70 degrees+ to allow for easily donning/doffing of apparel    Time  4    Period  Weeks    Status  On-going   PROM 56 degrees     PT LONG TERM GOAL #4   Title  Increase right shoulder strength to a solid 4/5 to increase stability for performance of functional activities    Time  4    Period  Weeks    Status  On-going   NT 01/19/18     PT LONG TERM GOAL #5   Title  Perform ADL's with right shoulder pain not > 3/10.    Period  Weeks    Status  On-going            Plan - 02/06/18 1116    Clinical Impression Statement  Patient was able to tolerate treatment well but required multiple tactile and verbal cues for control of movement. Patient did not report any increase of pain during treatment.  Patient continues to require intermittent oscillations during PROM to prevent muscle guarding. Normal response to modalities upon removal.     Rehab Potential  Fair    PT Frequency  2x / week    PT Duration  8 weeks    PT Treatment/Interventions  ADLs/Self Care Home Management;Cryotherapy;Electrical Stimulation;Ultrasound;Moist Heat;Functional mobility training;Therapeutic activities;Therapeutic  exercise;Neuromuscular re-education;Patient/family education;Passive range of motion;Vasopneumatic Device;Manual techniques    PT Next Visit Plan  Cont with POC for ROM and PRE slowly and modalities PRN for pain relief    Consulted and Agree with Plan of Care  Patient       Patient will benefit from skilled therapeutic intervention in order to improve the following deficits and impairments:  Pain, Decreased strength, Impaired UE functional use, Decreased range of motion, Postural dysfunction  Visit Diagnosis: Stiffness of right shoulder, not elsewhere classified  Muscle weakness (generalized)  Chronic right shoulder pain     Problem List Patient Active Problem List   Diagnosis Date Noted  . S/P shoulder replacement, right 08/22/2017  . HTN (hypertension) 02/19/2016  . Post-nasal drip 05/15/2015  . Postoperative abdominal pain 12/07/2014  . Voiding dysfunction 10/07/2014  . SUI (stress urinary incontinence, female) 10/07/2014  . Osteoporosis with fracture 04/06/2014  . Impacted cerumen of both ears 08/20/2013  . Encounter for therapeutic drug monitoring 08/20/2013  . DOE (dyspnea on exertion) 07/23/2013  . Helicobacter positive gastritis 06/21/2013  . Orthostatic hypotension 06/10/2013  . UTI (lower urinary tract infection) 05/06/2013  . Neck mass 02/19/2013  . Neck pain, bilateral 02/19/2013  . Right groin mass 11/08/2011  . Low back pain 11/08/2011  . Lipoma of neck 06/21/2011  . Memory loss 06/21/2011  . OVERACTIVE BLADDER 08/21/2010  . Long term current use of anticoagulant 07/25/2010  . Osteoarthritis 01/18/2010  . KNEE PAIN 01/18/2010  . FOOT PAIN 01/18/2010  . Hyperlipidemia with target LDL less than 100 04/20/2009  . GERD 04/17/2009  . MITRAL REGURGITATION, MILD 08/30/2008  . NICM (nonischemic cardiomyopathy) ([place]) 08/30/2008  . Congestive heart failure ([place]) 04/13/2007  . DEPRESSION 04/10/2007  . DISEASE, MITRAL VALVE NEC/NOS 04/10/2007  . Atrial  fibrillation ([place]) 04/10/2007  . STASIS ULCER 04/10/2007  . VENOUS INSUFFICIENCY, LEGS 04/10/2007   Gabriela Eves, PT, DPT 02/06/2018, 12:19 PM  [facility] [address] [place], [place], [address] Phone: [phone]   Fax:  [phone]  Name: NIRALI MAGOUIRK MRN: [id number] Date of Birth: 05-28-1941

## 2018-02-09 ENCOUNTER — Encounter: Payer: Self-pay | Admitting: Physical Therapy

## 2018-02-09 ENCOUNTER — Ambulatory Visit: Payer: Medicare Other | Admitting: Physical Therapy

## 2018-02-09 DIAGNOSIS — M545 Low back pain: Secondary | ICD-10-CM | POA: Diagnosis not present

## 2018-02-09 DIAGNOSIS — M6281 Muscle weakness (generalized): Secondary | ICD-10-CM

## 2018-02-09 DIAGNOSIS — R293 Abnormal posture: Secondary | ICD-10-CM | POA: Diagnosis not present

## 2018-02-09 DIAGNOSIS — M25611 Stiffness of right shoulder, not elsewhere classified: Secondary | ICD-10-CM

## 2018-02-09 DIAGNOSIS — M25511 Pain in right shoulder: Secondary | ICD-10-CM | POA: Diagnosis not present

## 2018-02-09 DIAGNOSIS — G8929 Other chronic pain: Secondary | ICD-10-CM | POA: Diagnosis not present

## 2018-02-09 NOTE — Therapy (Signed)
[facility] [place], [place], [address] Phone: [phone]   Fax:  [phone]  Physical Therapy Treatment   Progress Note Reporting Period 01/16/18 to 02/09/18  See note below for Objective Data and Assessment of Progress/Goals.       Patient Details  Name: Virginia Young MRN: [id number] Date of Birth: [birth year] Referring Provider: Esmond Plants MD.   Encounter Date: 02/09/2018  PT End of Session - 02/09/18 1052    Visit Number  20    Number of Visits  22    Date for PT Re-Evaluation  03/06/18    Authorization Type  10TH VISIT PROGRESS NOTE AND KX MODIFIER AFTER THE 15 VISIT.    PT Start Time  1032    PT Stop Time  1119    PT Time Calculation (min)  47 min    Activity Tolerance  Patient tolerated treatment well    Behavior During Therapy  WFL for tasks assessed/performed       Past Medical History:  Diagnosis Date  . Allergy   . Atrial fibrillation (HCC)    a. coumadin;  b. Amiodarone  . Cataract   . Chest pain 03/2012   a. Lex MV 10/13:  EF 64%, dist ant and apical defect sugg of soft tissue atten, no ischemia  . Chronic systolic heart failure ([place])   . Depression   . Dysrhythmia   . GERD (gastroesophageal reflux disease)   . HLD (hyperlipidemia)   . Incontinence of urine   . Mild mitral regurgitation   . MS (multiple sclerosis) ([place])   . NICM (nonischemic cardiomyopathy) ([place])    a. neg CLite in 2003;  b. EF 40-45% in past;   c.  Echo 12/11: EF 35-40%, mild MR, mild LAE, mild RAE, small pericardial effusion   . Osteoarthritis   . Osteoporosis   . Stasis ulcer ([place])   . Uterine prolapse   . Vaginal prolapse without uterine prolapse   . Varicose veins     Past Surgical History:  Procedure Laterality Date  . ANTERIOR AND POSTERIOR VAGINAL REPAIR W/ SACROSPINOUS LIGAMENT SUSPENSION  2016   [facility]  . BLADDER SURGERY    . CARPAL TUNNEL RELEASE Right 08/22/2017   Procedure: CARPAL TUNNEL RELEASE;   Surgeon: [name], MD;  Location: [facility];  Service: Orthopedics;  Laterality: Right;  . cataract surgery Bilateral   . EYE SURGERY    . INGUINAL HERNIA REPAIR     right  . LIPOMA EXCISION     h/o removed from upper back x 2  . REVERSE SHOULDER ARTHROPLASTY Right 08/22/2017   Procedure: REVERSE SHOULDER ARTHROPLASTY;  Surgeon: [name], MD;  Location: [facility];  Service: Orthopedics;  Laterality: Right;    There were no vitals filed for this visit.  Subjective Assessment - 02/09/18 1024    Subjective  Reports R shoulder soreness.    Pertinent History  MS; OP.    Limitations  Walking    How long can you stand comfortably?  <10 minutes.    How long can you walk comfortably?  Short distances.    Diagnostic tests  NCV test to right UE.  X-rays    Patient Stated Goals  Move left shoulder with less pain.    Currently in Pain?  Yes    Pain Score  --   No pain score provided   Pain Location  Shoulder    Pain Orientation  Right    Pain Descriptors /  Indicators  Sore    Pain Type  Surgical pain    Pain Onset  More than a month ago         [facility] PT Assessment - 02/09/18 0001      Assessment   Medical Diagnosis  S/p right reverse total shoulder.    Onset Date/Surgical Date  08/22/17      ROM / Strength   AROM / PROM / Strength  AROM      AROM   Overall AROM   Deficits    AROM Assessment Site  Shoulder    Right/Left Shoulder  Right    Right Shoulder Flexion  124 Degrees    Right Shoulder Internal Rotation  80 Degrees    Right Shoulder External Rotation  72 Degrees                   [facility] Adult PT Treatment/Exercise - 02/09/18 0001      Exercises   Exercises  Shoulder      Shoulder Exercises: Supine   Protraction  AROM;Right;20 reps   Tactile cues required.   Flexion  AROM;Both;20 reps    Other Supine Exercises  ABCs x1      Shoulder Exercises: Seated   Extension  Strengthening;Right;20 reps;Theraband    Theraband Level (Shoulder Extension)  Level 1  (Yellow)    Row  Strengthening;Right;20 reps;Theraband;Limitations;Other (comment)    Theraband Level (Shoulder Row)  Level 1 (Yellow)    External Rotation  AROM;Right;20 reps   Resistance attempted, stopped d/t weakness   Internal Rotation  Strengthening;Right;20 reps;Theraband    Theraband Level (Shoulder Internal Rotation)  Level 1 (Yellow)      Shoulder Exercises: Standing   Other Standing Exercises  Wall slides 2x10 reps      Shoulder Exercises: Pulleys   Flexion  5 minutes      Modalities   Modalities  Electrical Stimulation;Moist Heat      Moist Heat Therapy   Number Minutes Moist Heat  15 Minutes    Moist Heat Location  Shoulder      Electrical Stimulation   Electrical Stimulation Location  R shoulder    Electrical Stimulation Action  IFC    Electrical Stimulation Parameters  80-150 hz x15 min    Electrical Stimulation Goals  Pain      Manual Therapy   Manual Therapy  Passive ROM    Passive ROM  PROM of R shoulder into flex,ER, IR with holds at end range               PT Short Term Goals - 12/22/17 1428      PT SHORT TERM GOAL #1   Title  STG's=LTG's.        PT Long Term Goals - 02/09/18 1143      PT LONG TERM GOAL #1   Title  Independent with a HEP.    Baseline  instructed in table slides, isometrics 4 way    Time  4    Period  Weeks    Status  On-going   "I do them sometimes"     PT LONG TERM GOAL #2   Title  Active right shoulder flexion to 145 degrees so the patient can easily reach overhead    Time  4    Period  Weeks    Status  On-going   PROM 133     PT LONG TERM GOAL #3   Title  Active ER to 70 degrees+ to allow  for easily donning/doffing of apparel    Time  4    Period  Weeks    Status  Achieved      PT LONG TERM GOAL #4   Title  Increase right shoulder strength to a solid 4/5 to increase stability for performance of functional activities    Time  4    Period  Weeks    Status  On-going   NT 01/19/18     PT LONG TERM GOAL  #5   Title  Perform ADL's with right shoulder pain not > 3/10.    Period  Weeks    Status  On-going            Plan - 02/09/18 1123    Clinical Impression Statement  Patient tolerated today's treatment well although patient still experiencing R shoulder soreness and weakness of RUE. Patient required rest breaks during wall slides as fatigue would not allow full range. Resisted ER attempted today with yellow theraband but unable to complete due to weakness. Patient required increased cues verbally, tactilly along with demo to reinforce proper technique. Firm end feels and smooth arc of motion noted with PROM of R shoulder. AROM of R shoulder measurements documented in today's note. Normal modalities response noted following removal of the modalities.    Rehab Potential  Fair    PT Frequency  2x / week    PT Duration  8 weeks    PT Treatment/Interventions  ADLs/Self Care Home Management;Cryotherapy;Electrical Stimulation;Ultrasound;Moist Heat;Functional mobility training;Therapeutic activities;Therapeutic exercise;Neuromuscular re-education;Patient/family education;Passive range of motion;Vasopneumatic Device;Manual techniques    PT Next Visit Plan  Cont with POC for ROM and PRE slowly and modalities PRN for pain relief    Consulted and Agree with Plan of Care  Patient       Patient will benefit from skilled therapeutic intervention in order to improve the following deficits and impairments:  Pain, Decreased strength, Impaired UE functional use, Decreased range of motion, Postural dysfunction  Visit Diagnosis: Stiffness of right shoulder, not elsewhere classified  Muscle weakness (generalized)     Problem List Patient Active Problem List   Diagnosis Date Noted  . S/P shoulder replacement, right 08/22/2017  . HTN (hypertension) 02/19/2016  . Post-nasal drip 05/15/2015  . Postoperative abdominal pain 12/07/2014  . Voiding dysfunction 10/07/2014  . SUI (stress urinary  incontinence, female) 10/07/2014  . Osteoporosis with fracture 04/06/2014  . Impacted cerumen of both ears 08/20/2013  . Encounter for therapeutic drug monitoring 08/20/2013  . DOE (dyspnea on exertion) 07/23/2013  . Helicobacter positive gastritis 06/21/2013  . Orthostatic hypotension 06/10/2013  . UTI (lower urinary tract infection) 05/06/2013  . Neck mass 02/19/2013  . Neck pain, bilateral 02/19/2013  . Right groin mass 11/08/2011  . Low back pain 11/08/2011  . Lipoma of neck 06/21/2011  . Memory loss 06/21/2011  . OVERACTIVE BLADDER 08/21/2010  . Long term current use of anticoagulant 07/25/2010  . Osteoarthritis 01/18/2010  . KNEE PAIN 01/18/2010  . FOOT PAIN 01/18/2010  . Hyperlipidemia with target LDL less than 100 04/20/2009  . GERD 04/17/2009  . MITRAL REGURGITATION, MILD 08/30/2008  . NICM (nonischemic cardiomyopathy) ([place]) 08/30/2008  . Congestive heart failure ([place]) 04/13/2007  . DEPRESSION 04/10/2007  . DISEASE, MITRAL VALVE NEC/NOS 04/10/2007  . Atrial fibrillation ([place]) 04/10/2007  . STASIS ULCER 04/10/2007  . VENOUS INSUFFICIENCY, LEGS 04/10/2007   Standley Brooking, PTA 02/09/2018, 12:52 PM  [facility] [address] [place], [place], [address] Phone: [phone]  Fax:  [phone]  Name: JARISSA SHERIFF MRN: [id number] Date of Birth: 1940-10-29

## 2018-02-10 ENCOUNTER — Encounter: Payer: Self-pay | Admitting: Neurology

## 2018-02-10 ENCOUNTER — Ambulatory Visit (INDEPENDENT_AMBULATORY_CARE_PROVIDER_SITE_OTHER): Payer: Medicare Other | Admitting: Neurology

## 2018-02-10 ENCOUNTER — Telehealth: Payer: Self-pay | Admitting: Neurology

## 2018-02-10 VITALS — BP 119/92 | HR 53 | Ht 71.0 in | Wt 172.0 lb

## 2018-02-10 DIAGNOSIS — F0391 Unspecified dementia with behavioral disturbance: Secondary | ICD-10-CM

## 2018-02-10 DIAGNOSIS — Z79899 Other long term (current) drug therapy: Secondary | ICD-10-CM | POA: Diagnosis not present

## 2018-02-10 DIAGNOSIS — E539 Vitamin B deficiency, unspecified: Secondary | ICD-10-CM | POA: Diagnosis not present

## 2018-02-10 DIAGNOSIS — F0390 Unspecified dementia without behavioral disturbance: Secondary | ICD-10-CM | POA: Insufficient documentation

## 2018-02-10 DIAGNOSIS — G9340 Encephalopathy, unspecified: Secondary | ICD-10-CM

## 2018-02-10 DIAGNOSIS — R443 Hallucinations, unspecified: Secondary | ICD-10-CM | POA: Diagnosis not present

## 2018-02-10 MED ORDER — QUETIAPINE FUMARATE 25 MG PO TABS: 25.0000 mg | ORAL_TABLET | Freq: Two times a day (BID) | ORAL | 11 refills | Status: DC

## 2018-02-10 NOTE — Patient Instructions (Addendum)
[email or web address] Seroquel 25mg  twice a day Consider Aricept in the future Quetiapine tablets Probably need a "Memory Unit" What is this medicine? QUETIAPINE (kwe TYE a peen) is an antipsychotic. It is used to treat schizophrenia and bipolar disorder, also known as manic-depression. This medicine may be used for other purposes; ask your health care provider or pharmacist if you have questions. COMMON BRAND NAME(S): Seroquel What should I tell my health care provider before I take this medicine? They need to know if you have any of these conditions: -brain tumor or head injury -breast cancer -cataracts -diabetes -difficulty swallowing -heart disease -kidney disease -liver disease -low blood counts, like low white cell, platelet, or red cell counts -low blood pressure or dizziness when standing up -Parkinson's disease -previous heart attack -seizures -suicidal thoughts, plans, or attempt by you or a family member -thyroid disease -an unusual or allergic reaction to quetiapine, other medicines, foods, dyes, or preservatives -pregnant or trying to get pregnant -breast-feeding How should I use this medicine? Take this medicine by mouth. Swallow it with a drink of water. Follow the directions on the prescription label. If it upsets your stomach you can take it with food. Take your medicine at regular intervals. Do not take it more often than directed. Do not stop taking except on the advice of your doctor or health care professional. A special MedGuide will be given to you by the pharmacist with each prescription and refill. Be sure to read this information carefully each time. Talk to your pediatrician regarding the use of this medicine in children. While this drug may be prescribed for children as young as 10 years for selected conditions, precautions do apply. Patients over age 17 years may have a stronger reaction to this medicine and need smaller doses. Overdosage: If you think  you have taken too much of this medicine contact a poison control center or emergency room at once. NOTE: This medicine is only for you. Do not share this medicine with others. What if I miss a dose? If you miss a dose, take it as soon as you can. If it is almost time for your next dose, take only that dose. Do not take double or extra doses. What may interact with this medicine? Do not take this medicine with any of the following medications: -certain medicines for fungal infections like fluconazole, itraconazole, ketoconazole, posaconazole, voriconazole -cisapride -dofetilide -dronedarone -droperidol -grepafloxacin -halofantrine -phenothiazines like chlorpromazine, mesoridazine, thioridazine -pimozide -sparfloxacin -ziprasidone This medicine may also interact with the following medications: -alcohol -antiviral medicines for HIV or AIDS -certain medicines for blood pressure -certain medicines for depression, anxiety, or psychotic disturbances like haloperidol, lorazepam -certain medicines for diabetes -certain medicines for Parkinson's disease -certain medicines for seizures like carbamazepine, phenobarbital, phenytoin -cimetidine -erythromycin -other medicines that prolong the QT interval (cause an abnormal heart rhythm) -rifampin -steroid medicines like prednisone or cortisone This list may not describe all possible interactions. Give your health care provider a list of all the medicines, herbs, non-prescription drugs, or dietary supplements you use. Also tell them if you smoke, drink alcohol, or use illegal drugs. Some items may interact with your medicine. What should I watch for while using this medicine? Visit your doctor or health care professional for regular checks on your progress. It may be several weeks before you see the full effects of this medicine. Your health care provider may suggest that you have your eyes examined prior to starting this medicine, and every 6  months thereafter. If you have been  taking this medicine regularly for some time, do not suddenly stop taking it. You must gradually reduce the dose or your symptoms may get worse. Ask your doctor or health care professional for advice. Patients and their families should watch out for worsening depression or thoughts of suicide. Also watch out for sudden or severe changes in feelings such as feeling anxious, agitated, panicky, irritable, hostile, aggressive, impulsive, severely restless, overly excited and hyperactive, or not being able to sleep. If this happens, especially at the beginning of antidepressant treatment or after a change in dose, call your health care professional. Dennis Bast may get dizzy or drowsy. Do not drive, use machinery, or do anything that needs mental alertness until you know how this medicine affects you. Do not stand or sit up quickly, especially if you are an older patient. This reduces the risk of dizzy or fainting spells. Alcohol can increase dizziness and drowsiness. Avoid alcoholic drinks. Do not treat yourself for colds, diarrhea or allergies. Ask your doctor or health care professional for advice, some ingredients may increase possible side effects. This medicine can reduce the response of your body to heat or cold. Dress warm in cold weather and stay hydrated in hot weather. If possible, avoid extreme temperatures like saunas, hot tubs, very hot or cold showers, or activities that can cause dehydration such as vigorous exercise. What side effects may I notice from receiving this medicine? Side effects that you should report to your doctor or health care professional as soon as possible: -allergic reactions like skin rash, itching or hives, swelling of the face, lips, or tongue -difficulty swallowing -fast or irregular heartbeat -fever or chills, sore throat -fever with rash, swollen lymph nodes, or swelling of the face -increased hunger or thirst -increased  urination -problems with balance, talking, walking -seizures -stiff muscles -suicidal thoughts or other mood changes -uncontrollable head, mouth, neck, arm, or leg movements -unusually weak or tired Side effects that usually do not require medical attention (report to your doctor or health care professional if they continue or are bothersome): -change in sex drive or performance -constipation -drowsy or dizzy -dry mouth -stomach upset -weight gain This list may not describe all possible side effects. Call your doctor for medical advice about side effects. You may report side effects to FDA at 1-800-FDA-1088. Where should I keep my medicine? Keep out of the reach of children. Store at room temperature between 15 and 30 degrees C (59 and 86 degrees F). Throw away any unused medicine after the expiration date. NOTE: This sheet is a summary. It may not cover all possible information. If you have questions about this medicine, talk to your doctor, pharmacist, or health care provider.  2018 Elsevier/Gold Standard (2014-12-13 13:07:35)    Alzheimer Disease Alzheimer disease is a brain disease that affects memory, thinking, and behavior. People with Alzheimer disease lose mental abilities, and the disease gets worse over time. Survival with Alzheimer disease ranges from several years to as long as 20 years. What are the causes? This condition develops when a protein called beta-amyloid forms deposits in the brain. It is not known what causes these deposits to form. What increases the risk? This condition is more likely to develop in people who:  Are elderly.  Have a family history of dementia.  Have had a brain injury.  Have heart or blood vessel disease.  Have had a stroke.  Have high blood pressure or high cholesterol.  Have diabetes.  What are the signs or symptoms? Symptoms  of this condition happen in three stages, which often overlap. Early stage In this stage, you may  continue to be independent. You may still be able to drive, work, and be social. Symptoms in this stage include:  Minor memory problems, such as forgetting a name or what you read.  Difficulty with: ? Paying attention. ? Communicating. ? Doing familiar tasks. ? Learning new things.  Needing more time to do daily activities.  Anxiety.  Social withdrawal.  Loss of motivation.  Moderate stage In this stage, you will start to need care. This stage usually lasts the longest. Symptoms in this stage include:  Difficulty with expressing thoughts.  Memory loss that affects daily life. This can include forgetting: ? Your address or phone number. ? Events that have happened. ? Parts of your personal history, like where you went to school.  Confusion about where you are or what time it is.  Difficulty in judging distance.  Changes in personality, mood, and behavior. You may be moody, irritable, angry, frustrated, fearful, anxious, or suspicious.  Poor reasoning and judgment.  Delusions or hallucinations.  Changes in sleep patterns.  Wandering and getting lost.  Severe stage In the final stage, you will need help with your personal care and dailyactivities. Symptoms in this stage include:  Worsening memory loss.  Personality changes.  Loss of awareness of your surroundings.  Changes in physical abilities, including the ability to walk, sit, and swallow.  Difficulty in communicating.  Inability to control the bladder and bowels.  Increasing confusion.  Increasing disruptive behavior.  How is this diagnosed? This condition is diagnosed with an assessment by your health care provider. During this assessment, your health care provider will talk with you and your family, friends, or caregivers about your symptoms. A thorough medical history will be taken, and you will have a physical exam and tests. Tests may include:  Lab tests, such as blood or urine  tests.  Imaging tests, such as a CT scan, PET scan, or MRI.  A lumbar puncture. This test involves removing and testing a small amount of the fluid that surrounds the brain and spinal cord.  An electroencephalogram (EEG). In this test, small metal discs are used to measure electrical activity in the brain.  Memory tests, cognitive tests, and neuropsychological tests. These tests evaluate brain function.  How is this treated? At this time, there is no treatment to cure Alzheimer disease or stop it from getting worse. The goals of treatment are:  To slow down the disease.  To manage behavioral problems.  To provide you with a safe environment.  To make life easier for you and your caregivers.  The following treatment options are available:  Medicines. Medicines may help to slow down memory loss and control behavioral symptoms.  Talk therapy. Talk therapy provides you with education, support, and memory aids. It is most helpful in the early stages of the condition.  Counseling or spiritual guidance. It is normal to have a lot of feelings, including anger, relief, fear, and isolation. Counseling and guidance can help you deal with these feelings.  Caregiving. This involves having caregivers help you with your daily activities. Caregivers may be family members, friends, or trained medical professionals. Caregiving can be done at home or outside the home.  Family support groups. These provide education, emotional support, and information about community resources to family members who are taking care of you.  Follow these instructions at home: Medicines  Take over-the-counter and prescription medicines only  as told by your health care provider.  Avoid taking medicines that can affect thinking, such as pain or sleeping medicines. Lifestyle   Make healthy lifestyle choices: ? Be physically active as told by your health care provider. ? Do not use any tobacco products, such as  cigarettes, chewing tobacco, and e-cigarettes. If you need help quitting, ask your health care provider. ? Eat a healthy diet. ? Practice stress-management techniques when you get stressed. ? Stay social.  Drink enough fluid to keep your urine clear or pale yellow.  Make sure to get quality sleep. These tips can help you get a good night's rest: ? Avoid napping during the day. ? Keep your sleeping area dark and cool. ? Avoid exercising during the few hours before you go to bed. ? Avoid caffeine products in the evening. General instructions  Work with your health care provider to determine what you need help with and what your safety needs are.  If you were given a bracelet that tracks your location, make sure to wear it.  Keep all follow-up visits as told by your health care provider. This is important.  If you have questions or would like additional support, you may contact The [employer]: ? 24-hour helpline: (534) 066-7151 ? Website: [employer] Contact a health care provider if:  You have nausea, vomiting, or trouble with eating.  You have dizziness, or weakness.  You have new or worsening trouble with sleeping.  You or your family members become concerned for your safety. Get help right away if:  You develop chest pain or difficulty with breathing.  You pass out. This information is not intended to replace advice given to you by your health care provider. Make sure you discuss any questions you have with your health care provider. Document Released: 02/20/2004 Document Revised: 02/09/2016 Document Reviewed: 03/08/2015 Elsevier Interactive Patient Education  [employer].
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
## 2018-02-10 NOTE — Telephone Encounter (Signed)
Medicare/bankers life order sent to [facility]. No auth they will reach out to the pt to schedule.  °

## 2018-02-11 ENCOUNTER — Ambulatory Visit: Payer: Medicare Other | Admitting: Physical Therapy

## 2018-02-11 ENCOUNTER — Encounter: Payer: Self-pay | Admitting: Physical Therapy

## 2018-02-11 DIAGNOSIS — M25611 Stiffness of right shoulder, not elsewhere classified: Secondary | ICD-10-CM | POA: Diagnosis not present

## 2018-02-11 DIAGNOSIS — G8929 Other chronic pain: Secondary | ICD-10-CM

## 2018-02-11 DIAGNOSIS — M25511 Pain in right shoulder: Secondary | ICD-10-CM | POA: Diagnosis not present

## 2018-02-11 DIAGNOSIS — M545 Low back pain: Secondary | ICD-10-CM | POA: Diagnosis not present

## 2018-02-11 DIAGNOSIS — M6281 Muscle weakness (generalized): Secondary | ICD-10-CM | POA: Diagnosis not present

## 2018-02-11 DIAGNOSIS — R293 Abnormal posture: Secondary | ICD-10-CM | POA: Diagnosis not present

## 2018-02-11 NOTE — Therapy (Signed)
[facility] [place], [place], [address] Phone: [phone]   Fax:  [phone]  Physical Therapy Treatment  Patient Details  Name: MASHAL SLAVICK MRN: [id number] Date of Birth: 03-Jul-1940 Referring Provider: Esmond Plants MD.   Encounter Date: 02/11/2018  PT End of Session - 02/11/18 1001    Visit Number  21    Number of Visits  22    Date for PT Re-Evaluation  03/06/18    Authorization Type  10TH VISIT PROGRESS NOTE AND KX MODIFIER AFTER THE 15 VISIT.    PT Start Time  0945    PT Stop Time  1043    PT Time Calculation (min)  58 min    Activity Tolerance  Patient tolerated treatment well    Behavior During Therapy  WFL for tasks assessed/performed       Past Medical History:  Diagnosis Date  . Allergy   . Atrial fibrillation (HCC)    a. coumadin;  b. Amiodarone  . Cataract   . Chest pain 03/2012   a. Lex MV 10/13:  EF 64%, dist ant and apical defect sugg of soft tissue atten, no ischemia  . Chronic systolic heart failure ([place])   . Depression   . Dysrhythmia   . GERD (gastroesophageal reflux disease)   . HLD (hyperlipidemia)   . Incontinence of urine   . Mild mitral regurgitation   . MS (multiple sclerosis) ([place])   . NICM (nonischemic cardiomyopathy) ([place])    a. neg CLite in 2003;  b. EF 40-45% in past;   c.  Echo 12/11: EF 35-40%, mild MR, mild LAE, mild RAE, small pericardial effusion   . Osteoarthritis   . Osteoporosis   . Stasis ulcer ([place])   . Uterine prolapse   . Vaginal prolapse without uterine prolapse   . Varicose veins     Past Surgical History:  Procedure Laterality Date  . ANTERIOR AND POSTERIOR VAGINAL REPAIR W/ SACROSPINOUS LIGAMENT SUSPENSION  2016   [facility]  . BLADDER SURGERY    . CARPAL TUNNEL RELEASE Right 08/22/2017   Procedure: CARPAL TUNNEL RELEASE;  Surgeon: [name], MD;  Location: [facility];  Service: Orthopedics;  Laterality: Right;  . cataract surgery Bilateral   . EYE  SURGERY    . INGUINAL HERNIA REPAIR     right  . LIPOMA EXCISION     h/o removed from upper back x 2  . REVERSE SHOULDER ARTHROPLASTY Right 08/22/2017   Procedure: REVERSE SHOULDER ARTHROPLASTY;  Surgeon: [name], MD;  Location: [facility];  Service: Orthopedics;  Laterality: Right;    There were no vitals filed for this visit.  Subjective Assessment - 02/11/18 0946    Subjective  Patient arrived with ongoing soreness and weakness    Pertinent History  MS; OP.    Limitations  Walking    How long can you stand comfortably?  <10 minutes.    How long can you walk comfortably?  Short distances.    Diagnostic tests  NCV test to right UE.  X-rays    Patient Stated Goals  Move left shoulder with less pain.    Currently in Pain?  Yes    Pain Score  --   no number given   Pain Location  Shoulder    Pain Descriptors / Indicators  Sore    Pain Type  Surgical pain    Pain Onset  More than a month ago    Pain Frequency  Intermittent  Aggravating Factors   overhead movement makes it "sore"    Pain Relieving Factors  at rest                       [facility] Adult PT Treatment/Exercise - 02/11/18 0001      Shoulder Exercises: Supine   Flexion  AROM;Both;20 reps    Other Supine Exercises  RtUE at 90 degrees for circles x44min with rests      Shoulder Exercises: Seated   Extension  Strengthening;Right;20 reps;Theraband    Theraband Level (Shoulder Extension)  Level 1 (Yellow)    Row  Strengthening;Right;20 reps;Theraband;Limitations;Other (comment)    Theraband Level (Shoulder Row)  Level 1 (Yellow)    Protraction  Strengthening;Right;Theraband;Limitations;20 reps    Theraband Level (Shoulder Protraction)  Level 1 (Yellow)    Internal Rotation  Strengthening;Right;20 reps;Theraband    Theraband Level (Shoulder Internal Rotation)  Level 1 (Yellow)    Other Seated Exercises  cane for flexion 90 degrees 2x10      Shoulder Exercises: Pulleys   Flexion  5 minutes    Flexion  Limitations  monitored for progression      Moist Heat Therapy   Number Minutes Moist Heat  15 Minutes    Moist Heat Location  Shoulder      Electrical Stimulation   Electrical Stimulation Location  R shoulder    Electrical Stimulation Action  IFC    Electrical Stimulation Parameters  80-150hz  x30min      Manual Therapy   Manual Therapy  Passive ROM    Manual therapy comments  rhythmic stabs for ER/IR in scaption and flex/ext at 90 degrees flexion    Passive ROM  PROM of R shoulder into flex,ER, IR with holds at end range               PT Short Term Goals - 12/22/17 1428      PT SHORT TERM GOAL #1   Title  STG's=LTG's.        PT Long Term Goals - 02/09/18 1143      PT LONG TERM GOAL #1   Title  Independent with a HEP.    Baseline  instructed in table slides, isometrics 4 way    Time  4    Period  Weeks    Status  On-going   "I do them sometimes"     PT LONG TERM GOAL #2   Title  Active right shoulder flexion to 145 degrees so the patient can easily reach overhead    Time  4    Period  Weeks    Status  On-going   PROM 133     PT LONG TERM GOAL #3   Title  Active ER to 70 degrees+ to allow for easily donning/doffing of apparel    Time  4    Period  Weeks    Status  Achieved      PT LONG TERM GOAL #4   Title  Increase right shoulder strength to a solid 4/5 to increase stability for performance of functional activities    Time  4    Period  Weeks    Status  On-going   NT 01/19/18     PT LONG TERM GOAL #5   Title  Perform ADL's with right shoulder pain not > 3/10.    Period  Weeks    Status  On-going            Plan - 02/11/18  1003    Clinical Impression Statement  Patient tolerated treatment well today with ongoing soreness and weakness reported. Patient able to complete light ADL's yet any overhead movement is difficult due to weakness. Patient unable to stand for a prolong amount of time for exercises. Patient had flexion ROM deficts and  strength deficts esp for flexion and ER. Patient goals ongoing at this time.     Rehab Potential  Fair    PT Frequency  2x / week    PT Duration  8 weeks    PT Treatment/Interventions  ADLs/Self Care Home Management;Cryotherapy;Electrical Stimulation;Ultrasound;Moist Heat;Functional mobility training;Therapeutic activities;Therapeutic exercise;Neuromuscular re-education;Patient/family education;Passive range of motion;Vasopneumatic Device;Manual techniques    PT Next Visit Plan  Cont with POC for ROM and PRE slowly and modalities PRN for pain relief/ re-cert sent for 8 visits awaiting signature    Consulted and Agree with Plan of Care  Patient       Patient will benefit from skilled therapeutic intervention in order to improve the following deficits and impairments:  Pain, Decreased strength, Impaired UE functional use, Decreased range of motion, Postural dysfunction  Visit Diagnosis: Stiffness of right shoulder, not elsewhere classified  Muscle weakness (generalized)  Chronic right shoulder pain     Problem List Patient Active Problem List   Diagnosis Date Noted  . Dementia with behavioral disturbance 02/10/2018  . S/P shoulder replacement, right 08/22/2017  . HTN (hypertension) 02/19/2016  . Post-nasal drip 05/15/2015  . Postoperative abdominal pain 12/07/2014  . Voiding dysfunction 10/07/2014  . SUI (stress urinary incontinence, female) 10/07/2014  . Osteoporosis with fracture 04/06/2014  . Impacted cerumen of both ears 08/20/2013  . Encounter for therapeutic drug monitoring 08/20/2013  . DOE (dyspnea on exertion) 07/23/2013  . Helicobacter positive gastritis 06/21/2013  . Orthostatic hypotension 06/10/2013  . UTI (lower urinary tract infection) 05/06/2013  . Neck mass 02/19/2013  . Neck pain, bilateral 02/19/2013  . Right groin mass 11/08/2011  . Low back pain 11/08/2011  . Lipoma of neck 06/21/2011  . Memory loss 06/21/2011  . OVERACTIVE BLADDER 08/21/2010  . Long  term current use of anticoagulant 07/25/2010  . Osteoarthritis 01/18/2010  . KNEE PAIN 01/18/2010  . FOOT PAIN 01/18/2010  . Hyperlipidemia with target LDL less than 100 04/20/2009  . GERD 04/17/2009  . MITRAL REGURGITATION, MILD 08/30/2008  . NICM (nonischemic cardiomyopathy) ([place]) 08/30/2008  . Congestive heart failure ([place]) 04/13/2007  . DEPRESSION 04/10/2007  . DISEASE, MITRAL VALVE NEC/NOS 04/10/2007  . Atrial fibrillation ([place]) 04/10/2007  . STASIS ULCER 04/10/2007  . VENOUS INSUFFICIENCY, LEGS 04/10/2007    Ladean Raya, PTA 02/11/18 11:02 AM   [facility] [place], [place], [address] Phone: [phone]   Fax:  [phone]  Name: RAAGA MAEDER MRN: [id number] Date of Birth: 1941/05/03

## 2018-02-12 LAB — URINALYSIS, COMPLETE
Bilirubin, UA: NEGATIVE
Glucose, UA: NEGATIVE
Ketones, UA: NEGATIVE
Nitrite, UA: POSITIVE — AB
Protein, UA: NEGATIVE
Specific Gravity, UA: 1.015 (ref 1.005–1.030)
Urobilinogen, Ur: 0.2 mg/dL (ref 0.2–1.0)
pH, UA: 6.5 (ref 5.0–7.5)

## 2018-02-12 LAB — MICROSCOPIC EXAMINATION
Renal Epithel, UA: NONE SEEN /hpf
WBC, UA: >30 /hpf — AB (ref 0–5)

## 2018-02-13 ENCOUNTER — Ambulatory Visit: Payer: Medicare Other | Admitting: *Deleted

## 2018-02-13 DIAGNOSIS — M6281 Muscle weakness (generalized): Secondary | ICD-10-CM

## 2018-02-13 DIAGNOSIS — G8929 Other chronic pain: Secondary | ICD-10-CM

## 2018-02-13 DIAGNOSIS — M25511 Pain in right shoulder: Secondary | ICD-10-CM | POA: Diagnosis not present

## 2018-02-13 DIAGNOSIS — M25611 Stiffness of right shoulder, not elsewhere classified: Secondary | ICD-10-CM

## 2018-02-13 DIAGNOSIS — M545 Low back pain: Secondary | ICD-10-CM | POA: Diagnosis not present

## 2018-02-13 DIAGNOSIS — R293 Abnormal posture: Secondary | ICD-10-CM | POA: Diagnosis not present

## 2018-02-13 LAB — RPR: RPR Ser Ql: NONREACTIVE

## 2018-02-13 LAB — VITAMIN B1: Thiamine: 134.5 nmol/L (ref 66.5–200.0)

## 2018-02-13 LAB — AMMONIA: Ammonia: 33 ug/dL (ref 19–87)

## 2018-02-13 LAB — HOMOCYSTEINE: Homocysteine: 19.8 umol/L — ABNORMAL HIGH (ref 0.0–15.0)

## 2018-02-13 NOTE — Therapy (Signed)
[facility] [place], [place], [address] Phone: [phone]   Fax:  [phone]  Physical Therapy Treatment  Patient Details  Name: Virginia Young MRN: [id number] Date of Birth: 01/09/1941 Referring Provider: Esmond Plants MD.   Encounter Date: 02/13/2018  PT End of Session - 02/13/18 1000    Visit Number  22    Number of Visits  28    Date for PT Re-Evaluation  03/11/18    Authorization Type  10TH VISIT PROGRESS NOTE AND KX MODIFIER AFTER THE 15 VISIT.    PT Start Time  0945       Past Medical History:  Diagnosis Date  . Allergy   . Atrial fibrillation (HCC)    a. coumadin;  b. Amiodarone  . Cataract   . Chest pain 03/2012   a. Lex MV 10/13:  EF 64%, dist ant and apical defect sugg of soft tissue atten, no ischemia  . Chronic systolic heart failure ([place])   . Depression   . Dysrhythmia   . GERD (gastroesophageal reflux disease)   . HLD (hyperlipidemia)   . Incontinence of urine   . Mild mitral regurgitation   . MS (multiple sclerosis) ([place])   . NICM (nonischemic cardiomyopathy) ([place])    a. neg CLite in 2003;  b. EF 40-45% in past;   c.  Echo 12/11: EF 35-40%, mild MR, mild LAE, mild RAE, small pericardial effusion   . Osteoarthritis   . Osteoporosis   . Stasis ulcer ([place])   . Uterine prolapse   . Vaginal prolapse without uterine prolapse   . Varicose veins     Past Surgical History:  Procedure Laterality Date  . ANTERIOR AND POSTERIOR VAGINAL REPAIR W/ SACROSPINOUS LIGAMENT SUSPENSION  2016   [facility]  . BLADDER SURGERY    . CARPAL TUNNEL RELEASE Right 08/22/2017   Procedure: CARPAL TUNNEL RELEASE;  Surgeon: [name], MD;  Location: [facility];  Service: Orthopedics;  Laterality: Right;  . cataract surgery Bilateral   . EYE SURGERY    . INGUINAL HERNIA REPAIR     right  . LIPOMA EXCISION     h/o removed from upper back x 2  . REVERSE SHOULDER ARTHROPLASTY Right 08/22/2017   Procedure: REVERSE SHOULDER  ARTHROPLASTY;  Surgeon: [name], MD;  Location: [facility];  Service: Orthopedics;  Laterality: Right;    There were no vitals filed for this visit.  Subjective Assessment - 02/13/18 0958    Subjective  Patient arrived today not feeling well due to starting a new medicine yesterday. Feeling fuzzy headed    Pertinent History  MS; OP.    Limitations  Walking;Lifting    How long can you stand comfortably?  <10 minutes.    How long can you walk comfortably?  Short distances.    Diagnostic tests  NCV test to right UE.  X-rays    Patient Stated Goals  Move Right shoulder with less pain.    Currently in Pain?  Yes    Pain Score  2     Pain Location  Shoulder    Pain Orientation  Right    Pain Descriptors / Indicators  Sore    Pain Type  Surgical pain    Pain Onset  More than a month ago                       [facility] Adult PT Treatment/Exercise - 02/13/18 0001  Exercises   Exercises  Shoulder      Shoulder Exercises: Supine   Protraction  AROM;Right;20 reps;10 reps   Tactile cues required.   Flexion  AROM;20 reps;Right;10 reps    Other Supine Exercises  RtUE at 90 degrees for circles x76min with rests      Shoulder Exercises: Pulleys   Flexion  5 minutes    Flexion Limitations  monitored for progression      Modalities   Modalities  Electrical Stimulation;Moist Heat      Moist Heat Therapy   Number Minutes Moist Heat  15 Minutes    Moist Heat Location  Shoulder      Electrical Stimulation   Electrical Stimulation Location  R shoulder  IFC x 15 mins 80-150hz     Electrical Stimulation Goals  Pain      Manual Therapy   Manual Therapy  Passive ROM    Manual therapy comments  rhythmic stabs for ER/IR in scaption and flex/ext at 90 degrees flexion    Passive ROM  PROM of R shoulder into flex,ER, IR with holds at end range               PT Short Term Goals - 12/22/17 1428      PT SHORT TERM GOAL #1   Title  STG's=LTG's.        PT Long Term  Goals - 02/09/18 1143      PT LONG TERM GOAL #1   Title  Independent with a HEP.    Baseline  instructed in table slides, isometrics 4 way    Time  4    Period  Weeks    Status  On-going   "I do them sometimes"     PT LONG TERM GOAL #2   Title  Active right shoulder flexion to 145 degrees so the patient can easily reach overhead    Time  4    Period  Weeks    Status  On-going   PROM 133     PT LONG TERM GOAL #3   Title  Active ER to 70 degrees+ to allow for easily donning/doffing of apparel    Time  4    Period  Weeks    Status  Achieved      PT LONG TERM GOAL #4   Title  Increase right shoulder strength to a solid 4/5 to increase stability for performance of functional activities    Time  4    Period  Weeks    Status  On-going   NT 01/19/18     PT LONG TERM GOAL #5   Title  Perform ADL's with right shoulder pain not > 3/10.    Period  Weeks    Status  On-going            Plan - 02/13/18 1039    Clinical Impression Statement  Pt arrived today "not feeling right" due to a new medication that she sarted yesterday. She was unable to focus very well today and was a little lethargic. No standing exs were performed today due to Pt's status, but was able to perform sitting and supine exs. PROM for flexion 135 degrees, ER 50 degrees, and IR 80 degrees. Normal modality response.    Rehab Potential  Fair    PT Frequency  2x / week    PT Duration  8 weeks    PT Treatment/Interventions  ADLs/Self Care Home Management;Cryotherapy;Electrical Stimulation;Ultrasound;Moist Heat;Functional mobility training;Therapeutic activities;Therapeutic exercise;Neuromuscular re-education;Patient/family education;Passive range  of motion;Vasopneumatic Device;Manual techniques    PT Next Visit Plan  Cont with POC for ROM and PRE slowly and modalities PRN for pain relief    Consulted and Agree with Plan of Care  Patient       Patient will benefit from skilled therapeutic intervention in order to  improve the following deficits and impairments:  Pain, Decreased strength, Impaired UE functional use, Decreased range of motion, Postural dysfunction  Visit Diagnosis: Stiffness of right shoulder, not elsewhere classified  Muscle weakness (generalized)  Chronic right shoulder pain     Problem List Patient Active Problem List   Diagnosis Date Noted  . Dementia with behavioral disturbance 02/10/2018  . S/P shoulder replacement, right 08/22/2017  . HTN (hypertension) 02/19/2016  . Post-nasal drip 05/15/2015  . Postoperative abdominal pain 12/07/2014  . Voiding dysfunction 10/07/2014  . SUI (stress urinary incontinence, female) 10/07/2014  . Osteoporosis with fracture 04/06/2014  . Impacted cerumen of both ears 08/20/2013  . Encounter for therapeutic drug monitoring 08/20/2013  . DOE (dyspnea on exertion) 07/23/2013  . Helicobacter positive gastritis 06/21/2013  . Orthostatic hypotension 06/10/2013  . UTI (lower urinary tract infection) 05/06/2013  . Neck mass 02/19/2013  . Neck pain, bilateral 02/19/2013  . Right groin mass 11/08/2011  . Low back pain 11/08/2011  . Lipoma of neck 06/21/2011  . Memory loss 06/21/2011  . OVERACTIVE BLADDER 08/21/2010  . Long term current use of anticoagulant 07/25/2010  . Osteoarthritis 01/18/2010  . KNEE PAIN 01/18/2010  . FOOT PAIN 01/18/2010  . Hyperlipidemia with target LDL less than 100 04/20/2009  . GERD 04/17/2009  . MITRAL REGURGITATION, MILD 08/30/2008  . NICM (nonischemic cardiomyopathy) ([place]) 08/30/2008  . Congestive heart failure ([place]) 04/13/2007  . DEPRESSION 04/10/2007  . DISEASE, MITRAL VALVE NEC/NOS 04/10/2007  . Atrial fibrillation ([place]) 04/10/2007  . STASIS ULCER 04/10/2007  . VENOUS INSUFFICIENCY, LEGS 04/10/2007    Effa Yarrow,CHRIS , PTA 02/13/2018, 10:48 AM  [facility] [place], [place], [address] Phone: [phone]   Fax:  [phone]  Name: Virginia Young MRN: [id number] Date of Birth: August 24, 1940

## 2018-02-16 ENCOUNTER — Ambulatory Visit: Payer: Medicare Other | Admitting: Physical Therapy

## 2018-02-16 ENCOUNTER — Telehealth: Payer: Self-pay | Admitting: *Deleted

## 2018-02-16 ENCOUNTER — Encounter: Payer: Self-pay | Admitting: Physical Therapy

## 2018-02-16 DIAGNOSIS — M25511 Pain in right shoulder: Secondary | ICD-10-CM

## 2018-02-16 DIAGNOSIS — R293 Abnormal posture: Secondary | ICD-10-CM

## 2018-02-16 DIAGNOSIS — G8929 Other chronic pain: Secondary | ICD-10-CM | POA: Diagnosis not present

## 2018-02-16 DIAGNOSIS — M25611 Stiffness of right shoulder, not elsewhere classified: Secondary | ICD-10-CM

## 2018-02-16 DIAGNOSIS — M6281 Muscle weakness (generalized): Secondary | ICD-10-CM | POA: Diagnosis not present

## 2018-02-16 DIAGNOSIS — M545 Low back pain: Secondary | ICD-10-CM | POA: Diagnosis not present

## 2018-02-16 NOTE — Therapy (Signed)
[facility] [place], [place], [address] Phone: [phone]   Fax:  [phone]  Physical Therapy Treatment  Patient Details  Name: Virginia Young MRN: [id number] Date of Birth: 01/18/1941 Referring Provider: Esmond Plants MD.   Encounter Date: 02/16/2018  PT End of Session - 02/16/18 1025    Visit Number  23    Number of Visits  28    Date for PT Re-Evaluation  03/11/18    Authorization Type  10TH VISIT PROGRESS NOTE AND KX MODIFIER AFTER THE 15 VISIT.    PT Start Time  0945    PT Stop Time  1040    PT Time Calculation (min)  55 min    Activity Tolerance  Patient tolerated treatment well    Behavior During Therapy  WFL for tasks assessed/performed       Past Medical History:  Diagnosis Date  . Allergy   . Atrial fibrillation (HCC)    a. coumadin;  b. Amiodarone  . Cataract   . Chest pain 03/2012   a. Lex MV 10/13:  EF 64%, dist ant and apical defect sugg of soft tissue atten, no ischemia  . Chronic systolic heart failure ([place])   . Depression   . Dysrhythmia   . GERD (gastroesophageal reflux disease)   . HLD (hyperlipidemia)   . Incontinence of urine   . Mild mitral regurgitation   . MS (multiple sclerosis) ([place])   . NICM (nonischemic cardiomyopathy) ([place])    a. neg CLite in 2003;  b. EF 40-45% in past;   c.  Echo 12/11: EF 35-40%, mild MR, mild LAE, mild RAE, small pericardial effusion   . Osteoarthritis   . Osteoporosis   . Stasis ulcer ([place])   . Uterine prolapse   . Vaginal prolapse without uterine prolapse   . Varicose veins     Past Surgical History:  Procedure Laterality Date  . ANTERIOR AND POSTERIOR VAGINAL REPAIR W/ SACROSPINOUS LIGAMENT SUSPENSION  2016   [facility]  . BLADDER SURGERY    . CARPAL TUNNEL RELEASE Right 08/22/2017   Procedure: CARPAL TUNNEL RELEASE;  Surgeon: [name], MD;  Location: [place];  Service: Orthopedics;  Laterality: Right;  . cataract surgery Bilateral   . EYE  SURGERY    . INGUINAL HERNIA REPAIR     right  . LIPOMA EXCISION     h/o removed from upper back x 2  . REVERSE SHOULDER ARTHROPLASTY Right 08/22/2017   Procedure: REVERSE SHOULDER ARTHROPLASTY;  Surgeon: [name], MD;  Location: [place];  Service: Orthopedics;  Laterality: Right;    There were no vitals filed for this visit.  Subjective Assessment - 02/16/18 0956    Subjective  Patient arrived with come ongoing discomfort    Pertinent History  MS; OP.    Limitations  Walking;Lifting    How long can you stand comfortably?  <10 minutes.    How long can you walk comfortably?  Short distances.    Diagnostic tests  NCV test to right UE.  X-rays    Currently in Pain?  Yes    Pain Score  3     Pain Location  Shoulder    Pain Orientation  Right    Pain Descriptors / Indicators  Sore    Pain Type  Surgical pain    Pain Onset  More than a month ago    Pain Frequency  Intermittent    Aggravating Factors   ovrhead movement  Pain Relieving Factors  at rest         [facility] PT Assessment - 02/16/18 0001      AROM   AROM Assessment Site  Shoulder    Right/Left Shoulder  Right    Right Shoulder Flexion  100 Degrees    Right Shoulder External Rotation  70 Degrees      PROM   PROM Assessment Site  Shoulder    Right/Left Shoulder  Right    Right Shoulder Flexion  141 Degrees    Right Shoulder External Rotation  72 Degrees                   [facility] Adult PT Treatment/Exercise - 02/16/18 0001      Shoulder Exercises: Supine   Protraction  Strengthening;Right;20 reps;10 reps    Flexion  AROM;20 reps;Right;10 reps    Other Supine Exercises  RtUE at 90 degrees for circles x77min with rests      Shoulder Exercises: Seated   Row  Strengthening;Right;Theraband;Limitations;Other (comment);20 reps    Theraband Level (Shoulder Row)  Level 1 (Yellow)    Protraction  Strengthening;Right;Theraband;Limitations;20 reps    Theraband Level (Shoulder Protraction)  Level 1 (Yellow)     Internal Rotation  Strengthening;Right;20 reps;Theraband    Theraband Level (Shoulder Internal Rotation)  Level 1 (Yellow)    Other Seated Exercises  cane for flexion 90 degrees 2x10      Shoulder Exercises: Pulleys   Flexion  5 minutes    Flexion Limitations  monitored for progression      Moist Heat Therapy   Number Minutes Moist Heat  15 Minutes    Moist Heat Location  Shoulder      Electrical Stimulation   Electrical Stimulation Location  R shoulder  IFC x 15 mins 80-150hz     Electrical Stimulation Goals  Pain      Manual Therapy   Manual Therapy  Passive ROM    Manual therapy comments  rhythmic stabs for ER/IR in scaption and flex/ext at 90 degrees flexion    Passive ROM  PROM of R shoulder into flex,ER, IR with holds at end range               PT Short Term Goals - 12/22/17 1428      PT SHORT TERM GOAL #1   Title  STG's=LTG's.        PT Long Term Goals - 02/16/18 1022      PT LONG TERM GOAL #1   Title  Independent with a HEP.    Baseline  instructed in table slides, isometrics 4 way    Time  4    Period  Weeks    Status  On-going   patient not doing them as she should per reported 02/16/18     PT LONG TERM GOAL #2   Title  Active right shoulder flexion to 145 degrees so the patient can easily reach overhead    Time  4    Period  Weeks    Status  On-going   AROM 100 degrees 02/16/18     PT LONG TERM GOAL #3   Title  Active ER to 70 degrees+ to allow for easily donning/doffing of apparel    Time  4    Period  Weeks    Status  Achieved      PT LONG TERM GOAL #4   Title  Increase right shoulder strength to a solid 4/5 to increase stability for performance of  functional activities    Time  4    Period  Weeks    Status  On-going   NT 02/16/18     PT LONG TERM GOAL #5   Title  Perform ADL's with right shoulder pain not > 3/10.    Time  4    Period  Weeks    Status  On-going            Plan - 02/16/18 1027    Clinical Impression Statement   Patient tolerated treatment well today with ongoing soreness and weakness in right shoulder. Patient has improved overall with improved flexion antigravity and passively. Patient able to reach up for light ADL's and tasks yet limited still. Patient not doing HEP per reported. Patient is progressing toward goals.     Rehab Potential  Fair    PT Frequency  2x / week    PT Duration  8 weeks    PT Treatment/Interventions  ADLs/Self Care Home Management;Cryotherapy;Electrical Stimulation;Ultrasound;Moist Heat;Functional mobility training;Therapeutic activities;Therapeutic exercise;Neuromuscular re-education;Patient/family education;Passive range of motion;Vasopneumatic Device;Manual techniques    PT Next Visit Plan  Cont with POC for ROM and PRE slowly and modalities PRN for pain relief    Consulted and Agree with Plan of Care  Patient       Patient will benefit from skilled therapeutic intervention in order to improve the following deficits and impairments:  Pain, Decreased strength, Impaired UE functional use, Decreased range of motion, Postural dysfunction  Visit Diagnosis: Stiffness of right shoulder, not elsewhere classified  Muscle weakness (generalized)  Chronic right shoulder pain  Chronic right-sided low back pain without sciatica  Abnormal posture     Problem List Patient Active Problem List   Diagnosis Date Noted  . Dementia with behavioral disturbance 02/10/2018  . S/P shoulder replacement, right 08/22/2017  . HTN (hypertension) 02/19/2016  . Post-nasal drip 05/15/2015  . Postoperative abdominal pain 12/07/2014  . Voiding dysfunction 10/07/2014  . SUI (stress urinary incontinence, female) 10/07/2014  . Osteoporosis with fracture 04/06/2014  . Impacted cerumen of both ears 08/20/2013  . Encounter for therapeutic drug monitoring 08/20/2013  . DOE (dyspnea on exertion) 07/23/2013  . Helicobacter positive gastritis 06/21/2013  . Orthostatic hypotension 06/10/2013  . UTI  (lower urinary tract infection) 05/06/2013  . Neck mass 02/19/2013  . Neck pain, bilateral 02/19/2013  . Right groin mass 11/08/2011  . Low back pain 11/08/2011  . Lipoma of neck 06/21/2011  . Memory loss 06/21/2011  . OVERACTIVE BLADDER 08/21/2010  . Long term current use of anticoagulant 07/25/2010  . Osteoarthritis 01/18/2010  . KNEE PAIN 01/18/2010  . FOOT PAIN 01/18/2010  . Hyperlipidemia with target LDL less than 100 04/20/2009  . GERD 04/17/2009  . MITRAL REGURGITATION, MILD 08/30/2008  . NICM (nonischemic cardiomyopathy) ([place]) 08/30/2008  . Congestive heart failure ([place]) 04/13/2007  . DEPRESSION 04/10/2007  . DISEASE, MITRAL VALVE NEC/NOS 04/10/2007  . Atrial fibrillation ([place]) 04/10/2007  . STASIS ULCER 04/10/2007  . VENOUS INSUFFICIENCY, LEGS 04/10/2007    Verlin Duke P, PTA 02/16/2018, 10:43 AM  [facility] [place], [place], [address] Phone: [phone]   Fax:  [phone]  Name: Virginia Young MRN: [id number] Date of Birth: [birth year]

## 2018-02-16 NOTE — Telephone Encounter (Addendum)
Called main number on chart, daughter Vella Kohler (on [place]). LVM with office number asking for call back.   ----- Message from Melvenia Beam, MD sent at 02/16/2018 10:50 AM EDT ----- Patient's homocysteine is elevated. This is a marker for vitamin B deficiencies and is a risk for dementia. I would get a multi-b vitamin(folate, b6 and b12) over the counter and try taking it for several months and we can restest homocysteine thanks.

## 2018-02-16 NOTE — Telephone Encounter (Signed)
Spoke with pt's daughter [name]. Informed her that Dr. Jaynee Eagles says pt's homocysteine is elevated. This is a marker for vitamin B deficiencies and is a risk for dementia. Dr. Jaynee Eagles recommends that pt take a multi B vitamin (with folate, B6, and B12 in it) and we can retest in several months. Suggested that pt try to take this supplement daily and it can be purchased over the counter. Pam verbalized understanding and appreciation. She has not heard from GI yet. Advised her it looks like authorization was done a few days ago. Gave her GI's number to call [phone]) if she does not hear from them this week. She verbalized appreciation.

## 2018-02-16 NOTE — Telephone Encounter (Signed)
Patient's daughter Virginia Young is returning your call. She can be reached at [phone].

## 2018-02-18 ENCOUNTER — Ambulatory Visit: Payer: Medicare Other | Admitting: Physical Therapy

## 2018-02-18 ENCOUNTER — Encounter: Payer: Self-pay | Admitting: Physical Therapy

## 2018-02-18 DIAGNOSIS — G8929 Other chronic pain: Secondary | ICD-10-CM | POA: Diagnosis not present

## 2018-02-18 DIAGNOSIS — R293 Abnormal posture: Secondary | ICD-10-CM

## 2018-02-18 DIAGNOSIS — M545 Low back pain, unspecified: Secondary | ICD-10-CM

## 2018-02-18 DIAGNOSIS — M25511 Pain in right shoulder: Secondary | ICD-10-CM

## 2018-02-18 DIAGNOSIS — M25611 Stiffness of right shoulder, not elsewhere classified: Secondary | ICD-10-CM | POA: Diagnosis not present

## 2018-02-18 DIAGNOSIS — M6281 Muscle weakness (generalized): Secondary | ICD-10-CM

## 2018-02-18 NOTE — Therapy (Signed)
[facility] [place], [place], [address] Phone: [phone]   Fax:  [phone]  Physical Therapy Treatment  Patient Details  Name: Virginia Young MRN: [id number] Date of Birth: 07-12-1940 Referring Provider: Esmond Plants MD.   Encounter Date: 02/18/2018  PT End of Session - 02/18/18 1028    Visit Number  24    Number of Visits  28    Date for PT Re-Evaluation  03/11/18    Authorization Type  10TH VISIT PROGRESS NOTE AND KX MODIFIER AFTER THE 15 VISIT.    PT Start Time  [phone]    PT Stop Time  1041    PT Time Calculation (min)  58 min    Activity Tolerance  Patient tolerated treatment well    Behavior During Therapy  WFL for tasks assessed/performed       Past Medical History:  Diagnosis Date  . Allergy   . Atrial fibrillation (HCC)    a. coumadin;  b. Amiodarone  . Cataract   . Chest pain 03/2012   a. Lex MV 10/13:  EF 64%, dist ant and apical defect sugg of soft tissue atten, no ischemia  . Chronic systolic heart failure ([place])   . Depression   . Dysrhythmia   . GERD (gastroesophageal reflux disease)   . HLD (hyperlipidemia)   . Incontinence of urine   . Mild mitral regurgitation   . MS (multiple sclerosis) ([place])   . NICM (nonischemic cardiomyopathy) ([place])    a. neg CLite in 2003;  b. EF 40-45% in past;   c.  Echo 12/11: EF 35-40%, mild MR, mild LAE, mild RAE, small pericardial effusion   . Osteoarthritis   . Osteoporosis   . Stasis ulcer ([place])   . Uterine prolapse   . Vaginal prolapse without uterine prolapse   . Varicose veins     Past Surgical History:  Procedure Laterality Date  . ANTERIOR AND POSTERIOR VAGINAL REPAIR W/ SACROSPINOUS LIGAMENT SUSPENSION  2016   [facility]  . BLADDER SURGERY    . CARPAL TUNNEL RELEASE Right 08/22/2017   Procedure: CARPAL TUNNEL RELEASE;  Surgeon: [name], MD;  Location: [place];  Service: Orthopedics;  Laterality: Right;  . cataract surgery Bilateral   . EYE  SURGERY    . INGUINAL HERNIA REPAIR     right  . LIPOMA EXCISION     h/o removed from upper back x 2  . REVERSE SHOULDER ARTHROPLASTY Right 08/22/2017   Procedure: REVERSE SHOULDER ARTHROPLASTY;  Surgeon: [name], MD;  Location: [place];  Service: Orthopedics;  Laterality: Right;    There were no vitals filed for this visit.  Subjective Assessment - 02/18/18 0947    Subjective  Patient arrived with no new complaints, some ongoing discomfort with overhead movement    Pertinent History  MS; OP.    Limitations  Walking;Lifting    How long can you stand comfortably?  <10 minutes.    How long can you walk comfortably?  Short distances.    Diagnostic tests  NCV test to right UE.  X-rays    Patient Stated Goals  Move Right shoulder with less pain.    Currently in Pain?  Yes    Pain Score  3     Pain Location  Shoulder    Pain Orientation  Right    Pain Descriptors / Indicators  Sore    Pain Type  Surgical pain    Pain Onset  More than a month  ago    Pain Frequency  Intermittent    Aggravating Factors   overhead movement    Pain Relieving Factors  at rest                       [facility] Adult PT Treatment/Exercise - 02/18/18 0001      Exercises   Exercises  Shoulder      Shoulder Exercises: Supine   Other Supine Exercises  RtUE at 90 degrees for circles x37min with rests      Shoulder Exercises: Seated   Row  Strengthening;Right;Theraband;Limitations;Other (comment);20 reps    Theraband Level (Shoulder Row)  Level 1 (Yellow)    Protraction  Strengthening;Right;Theraband;Limitations;20 reps    Theraband Level (Shoulder Protraction)  Level 1 (Yellow)    Internal Rotation  Strengthening;Right;20 reps;Theraband    Theraband Level (Shoulder Internal Rotation)  Level 1 (Yellow)    Other Seated Exercises  cane for flexion 90 degrees 2x10    Other Seated Exercises  seated for bil retraction and punches with cane/yellow t-band 3x10 each      Shoulder Exercises: Pulleys    Flexion  5 minutes    Flexion Limitations  monitored for progression      Moist Heat Therapy   Number Minutes Moist Heat  15 Minutes    Moist Heat Location  Shoulder      Electrical Stimulation   Electrical Stimulation Location  R shoulder  IFC x 15 mins 80-150hz     Electrical Stimulation Goals  Pain      Manual Therapy   Manual Therapy  Passive ROM    Manual therapy comments  rhythmic stabs for ER/IR in scaption and flex/ext at 90 degrees flexion    Passive ROM  PROM of R shoulder into flex,ER, IR with holds at end range             PT Education - 02/18/18 1016    Education Details  HEP and review of initial HEP    Person(s) Educated  Patient    Methods  Explanation;Demonstration;Handout    Comprehension  Verbalized understanding;Returned demonstration       PT Short Term Goals - 12/22/17 1428      PT SHORT TERM GOAL #1   Title  STG's=LTG's.        PT Long Term Goals - 02/18/18 1001      PT LONG TERM GOAL #1   Title  Independent with a HEP.    Time  4    Period  Weeks    Status  Achieved   reviewd exercises initial and new HEP verbal and demo 02/18/18     PT LONG TERM GOAL #2   Title  Active right shoulder flexion to 145 degrees so the patient can easily reach overhead    Time  4    Period  Weeks    Status  On-going   AROM 100 degrees 02/18/18     PT LONG TERM GOAL #3   Title  Active ER to 70 degrees+ to allow for easily donning/doffing of apparel    Time  4    Period  Weeks    Status  Achieved      PT LONG TERM GOAL #4   Title  Increase right shoulder strength to a solid 4/5 to increase stability for performance of functional activities    Time  4    Period  Weeks    Status  On-going   NT 02/18/18  PT LONG TERM GOAL #5   Title  Perform ADL's with right shoulder pain not > 3/10.    Time  4    Period  Weeks    Status  Achieved   no more than 3/10 with ADL's 02/18/18           Plan - 02/18/18 1029    Clinical Impression Statement   Patient tolerated treatment well today. Patient has improved with reaching overhead to 100 degrees with no increased discomfort. Patient able to perform ADL's with 3/10 discomfort at most. Patient reported some difficulty with grip today and that she was dropping object. Today issued yellow thera putty and new HEP for shoulder strengthening. Patient able to meet LTG #1 and #5 today. Strebgth goals ongoing at this time.     Rehab Potential  Fair    PT Frequency  2x / week    PT Duration  8 weeks    PT Treatment/Interventions  ADLs/Self Care Home Management;Cryotherapy;Electrical Stimulation;Ultrasound;Moist Heat;Functional mobility training;Therapeutic activities;Therapeutic exercise;Neuromuscular re-education;Patient/family education;Passive range of motion;Vasopneumatic Device;Manual techniques    PT Next Visit Plan  Cont with POC for ROM and PRE slowly and modalities PRN for pain relief    Consulted and Agree with Plan of Care  Patient       Patient will benefit from skilled therapeutic intervention in order to improve the following deficits and impairments:  Pain, Decreased strength, Impaired UE functional use, Decreased range of motion, Postural dysfunction  Visit Diagnosis: Stiffness of right shoulder, not elsewhere classified  Muscle weakness (generalized)  Chronic right shoulder pain  Chronic right-sided low back pain without sciatica  Abnormal posture     Problem List Patient Active Problem List   Diagnosis Date Noted  . Dementia with behavioral disturbance 02/10/2018  . S/P shoulder replacement, right 08/22/2017  . HTN (hypertension) 02/19/2016  . Post-nasal drip 05/15/2015  . Postoperative abdominal pain 12/07/2014  . Voiding dysfunction 10/07/2014  . SUI (stress urinary incontinence, female) 10/07/2014  . Osteoporosis with fracture 04/06/2014  . Impacted cerumen of both ears 08/20/2013  . Encounter for therapeutic drug monitoring 08/20/2013  . DOE (dyspnea on  exertion) 07/23/2013  . Helicobacter positive gastritis 06/21/2013  . Orthostatic hypotension 06/10/2013  . UTI (lower urinary tract infection) 05/06/2013  . Neck mass 02/19/2013  . Neck pain, bilateral 02/19/2013  . Right groin mass 11/08/2011  . Low back pain 11/08/2011  . Lipoma of neck 06/21/2011  . Memory loss 06/21/2011  . OVERACTIVE BLADDER 08/21/2010  . Long term current use of anticoagulant 07/25/2010  . Osteoarthritis 01/18/2010  . KNEE PAIN 01/18/2010  . FOOT PAIN 01/18/2010  . Hyperlipidemia with target LDL less than 100 04/20/2009  . GERD 04/17/2009  . MITRAL REGURGITATION, MILD 08/30/2008  . NICM (nonischemic cardiomyopathy) ([place]) 08/30/2008  . Congestive heart failure ([place]) 04/13/2007  . DEPRESSION 04/10/2007  . DISEASE, MITRAL VALVE NEC/NOS 04/10/2007  . Atrial fibrillation ([place]) 04/10/2007  . STASIS ULCER 04/10/2007  . VENOUS INSUFFICIENCY, LEGS 04/10/2007    Shellsea Borunda P, PTA 02/18/2018, 10:53 AM  [facility] [place], [place], [address] Phone: [phone]   Fax:  [phone]  Name: Virginia Young MRN: [id number] Date of Birth: 11-Mar-1941

## 2018-02-18 NOTE — Patient Instructions (Addendum)
Sitting: Dumbbell Military Press - Stable (Active)    Sit, holding dumbbells or CANE  at shoulder height. Press dumbbells to arm's length. Use _1_ lbs. Complete __10_ sets of _3_ repetitions. Perform _2 sessions per day.     Stretch Break - Chest and Shoulder Stretch   Maintaining erect posture, draw shoulders back while bringing elbows back and inward. Return to starting position. Repeat __10-20__ times every _3-4___ hours.

## 2018-02-20 ENCOUNTER — Ambulatory Visit: Payer: Medicare Other | Admitting: Physical Therapy

## 2018-02-20 ENCOUNTER — Encounter: Payer: Self-pay | Admitting: Physical Therapy

## 2018-02-20 DIAGNOSIS — M25611 Stiffness of right shoulder, not elsewhere classified: Secondary | ICD-10-CM | POA: Diagnosis not present

## 2018-02-20 DIAGNOSIS — M545 Low back pain: Secondary | ICD-10-CM | POA: Diagnosis not present

## 2018-02-20 DIAGNOSIS — M25511 Pain in right shoulder: Secondary | ICD-10-CM | POA: Diagnosis not present

## 2018-02-20 DIAGNOSIS — M6281 Muscle weakness (generalized): Secondary | ICD-10-CM | POA: Diagnosis not present

## 2018-02-20 DIAGNOSIS — G8929 Other chronic pain: Secondary | ICD-10-CM

## 2018-02-20 DIAGNOSIS — R293 Abnormal posture: Secondary | ICD-10-CM | POA: Diagnosis not present

## 2018-02-20 NOTE — Therapy (Signed)
[facility], [place], [address] Phone: [phone]   Fax:  [phone]  Physical Therapy Treatment  Patient Details  Name: Virginia Young MRN: [id number] Date of Birth: [birth year] Referring Provider: Esmond Plants MD.   Encounter Date: 02/20/2018  PT End of Session - 02/20/18 1036    Visit Number  25    Number of Visits  28    Date for PT Re-Evaluation  03/11/18    Authorization Type  10TH VISIT PROGRESS NOTE AND KX MODIFIER AFTER THE 15 VISIT.    PT Start Time  [phone]    PT Stop Time  1031    PT Time Calculation (min)  45 min    Activity Tolerance  Patient tolerated treatment well    Behavior During Therapy  WFL for tasks assessed/performed       Past Medical History:  Diagnosis Date  . Allergy   . Atrial fibrillation (HCC)    a. coumadin;  b. Amiodarone  . Cataract   . Chest pain 03/2012   a. Lex MV 10/13:  EF 64%, dist ant and apical defect sugg of soft tissue atten, no ischemia  . Chronic systolic heart failure ([place])   . Depression   . Dysrhythmia   . GERD (gastroesophageal reflux disease)   . HLD (hyperlipidemia)   . Incontinence of urine   . Mild mitral regurgitation   . MS (multiple sclerosis) ([place])   . NICM (nonischemic cardiomyopathy) ([place])    a. neg CLite in 2003;  b. EF 40-45% in past;   c.  Echo 12/11: EF 35-40%, mild MR, mild LAE, mild RAE, small pericardial effusion   . Osteoarthritis   . Osteoporosis   . Stasis ulcer ([place])   . Uterine prolapse   . Vaginal prolapse without uterine prolapse   . Varicose veins     Past Surgical History:  Procedure Laterality Date  . ANTERIOR AND POSTERIOR VAGINAL REPAIR W/ SACROSPINOUS LIGAMENT SUSPENSION  2016   [facility]  . BLADDER SURGERY    . CARPAL TUNNEL RELEASE Right 08/22/2017   Procedure: CARPAL TUNNEL RELEASE;  Surgeon: [name], MD;  Location: [facility];  Service: Orthopedics;  Laterality: Right;  . cataract surgery Bilateral   . EYE  SURGERY    . INGUINAL HERNIA REPAIR     right  . LIPOMA EXCISION     h/o removed from upper back x 2  . REVERSE SHOULDER ARTHROPLASTY Right 08/22/2017   Procedure: REVERSE SHOULDER ARTHROPLASTY;  Surgeon: [name], MD;  Location: [facility];  Service: Orthopedics;  Laterality: Right;    There were no vitals filed for this visit.  Subjective Assessment - 02/20/18 1003    Subjective  Patient arrived in clinic with reports of almost blacking out prior to coming to PT. Reports that she feels weak all over today.    Pertinent History  MS; OP.    Limitations  Walking;Lifting    How long can you stand comfortably?  <10 minutes.    How long can you walk comfortably?  Short distances.    Diagnostic tests  NCV test to right UE.  X-rays    Patient Stated Goals  Move Right shoulder with less pain.    Currently in Pain?  Other (Comment)   No pain assessement completed by patient and no complaints of pain during treatment.        [facility] PT Assessment - 02/20/18 0001      Assessment   Medical  Diagnosis  S/p right reverse total shoulder.    Onset Date/Surgical Date  08/22/17                   [facility] Adult PT Treatment/Exercise - 02/20/18 0001      Exercises   Exercises  Shoulder      Shoulder Exercises: Supine   Flexion  AROM;Right;20 reps      Shoulder Exercises: Seated   Row  Strengthening;Right;Theraband;Limitations;Other (comment);20 reps    Theraband Level (Shoulder Row)  Level 1 (Yellow)    Protraction  Strengthening;Right;Theraband;Limitations;20 reps    Theraband Level (Shoulder Protraction)  Level 1 (Yellow)    External Rotation  AROM;Right;20 reps    Internal Rotation  Strengthening;Right;20 reps;Theraband    Theraband Level (Shoulder Internal Rotation)  Level 1 (Yellow)    Flexion  AROM;Right;15 reps   fatigue     Shoulder Exercises: Sidelying   External Rotation  AROM;Right;20 reps      Shoulder Exercises: Pulleys   Flexion  5 minutes      Modalities    Modalities  Electrical Stimulation;Moist Heat      Moist Heat Therapy   Number Minutes Moist Heat  15 Minutes    Moist Heat Location  Shoulder      Electrical Stimulation   Electrical Stimulation Location  R shoulder    Electrical Stimulation Action  Pre-Mod    Electrical Stimulation Parameters  80-150 hz x15 min    Electrical Stimulation Goals  Pain      Manual Therapy   Manual Therapy  Passive ROM    Passive ROM  PROM of R shoulder into flex,ER, IR with holds at end range               PT Short Term Goals - 12/22/17 1428      PT SHORT TERM GOAL #1   Title  STG's=LTG's.        PT Long Term Goals - 02/18/18 1001      PT LONG TERM GOAL #1   Title  Independent with a HEP.    Time  4    Period  Weeks    Status  Achieved   reviewd exercises initial and new HEP verbal and demo 02/18/18     PT LONG TERM GOAL #2   Title  Active right shoulder flexion to 145 degrees so the patient can easily reach overhead    Time  4    Period  Weeks    Status  On-going   AROM 100 degrees 02/18/18     PT LONG TERM GOAL #3   Title  Active ER to 70 degrees+ to allow for easily donning/doffing of apparel    Time  4    Period  Weeks    Status  Achieved      PT LONG TERM GOAL #4   Title  Increase right shoulder strength to a solid 4/5 to increase stability for performance of functional activities    Time  4    Period  Weeks    Status  On-going   NT 02/18/18     PT LONG TERM GOAL #5   Title  Perform ADL's with right shoulder pain not > 3/10.    Time  4    Period  Weeks    Status  Achieved   no more than 3/10 with ADL's 02/18/18           Plan - 02/20/18 1040    Clinical Impression  Statement  Patient tolerated today's treatment fair as she arrived with reports of generalized weakness. Patient able to complete resisted and AROM fairly well although she continues to fatigue and require excessive amounts of VCs, tactile cues and demo. Patient very weak with resisted R shoulder  ER thus exercise completed actively but patient required freqent VCs but predominately tactile cueing to complete properly. Firm end feels and smooth arc of motion noted with PROM of R shoulder. Normal modalities response noted following removal of the modalities.     Rehab Potential  Fair    PT Frequency  2x / week    PT Duration  8 weeks    PT Treatment/Interventions  ADLs/Self Care Home Management;Cryotherapy;Electrical Stimulation;Ultrasound;Moist Heat;Functional mobility training;Therapeutic activities;Therapeutic exercise;Neuromuscular re-education;Patient/family education;Passive range of motion;Vasopneumatic Device;Manual techniques    PT Next Visit Plan  Cont with POC for ROM and PRE slowly and modalities PRN for pain relief    Consulted and Agree with Plan of Care  Patient       Patient will benefit from skilled therapeutic intervention in order to improve the following deficits and impairments:  Pain, Decreased strength, Impaired UE functional use, Decreased range of motion, Postural dysfunction  Visit Diagnosis: Stiffness of right shoulder, not elsewhere classified  Muscle weakness (generalized)  Chronic right shoulder pain     Problem List Patient Active Problem List   Diagnosis Date Noted  . Dementia with behavioral disturbance 02/10/2018  . S/P shoulder replacement, right 08/22/2017  . HTN (hypertension) 02/19/2016  . Post-nasal drip 05/15/2015  . Postoperative abdominal pain 12/07/2014  . Voiding dysfunction 10/07/2014  . SUI (stress urinary incontinence, female) 10/07/2014  . Osteoporosis with fracture 04/06/2014  . Impacted cerumen of both ears 08/20/2013  . Encounter for therapeutic drug monitoring 08/20/2013  . DOE (dyspnea on exertion) 07/23/2013  . Helicobacter positive gastritis 06/21/2013  . Orthostatic hypotension 06/10/2013  . UTI (lower urinary tract infection) 05/06/2013  . Neck mass 02/19/2013  . Neck pain, bilateral 02/19/2013  . Right groin mass  11/08/2011  . Low back pain 11/08/2011  . Lipoma of neck 06/21/2011  . Memory loss 06/21/2011  . OVERACTIVE BLADDER 08/21/2010  . Long term current use of anticoagulant 07/25/2010  . Osteoarthritis 01/18/2010  . KNEE PAIN 01/18/2010  . FOOT PAIN 01/18/2010  . Hyperlipidemia with target LDL less than 100 04/20/2009  . GERD 04/17/2009  . MITRAL REGURGITATION, MILD 08/30/2008  . NICM (nonischemic cardiomyopathy) ([place]) 08/30/2008  . Congestive heart failure ([place]) 04/13/2007  . DEPRESSION 04/10/2007  . DISEASE, MITRAL VALVE NEC/NOS 04/10/2007  . Atrial fibrillation ([place]) 04/10/2007  . STASIS ULCER 04/10/2007  . VENOUS INSUFFICIENCY, LEGS 04/10/2007    Standley Brooking, PTA 02/20/2018, 11:47 AM  [facility] [address] [place], [place], [address] Phone: [phone]   Fax:  [phone]  Name: Virginia Young MRN: [id number] Date of Birth: 01-31-1941

## 2018-02-25 ENCOUNTER — Ambulatory Visit: Payer: Medicare Other | Attending: Orthopedic Surgery | Admitting: Physical Therapy

## 2018-02-25 ENCOUNTER — Encounter: Payer: Self-pay | Admitting: Physical Therapy

## 2018-02-25 DIAGNOSIS — M6281 Muscle weakness (generalized): Secondary | ICD-10-CM | POA: Insufficient documentation

## 2018-02-25 DIAGNOSIS — M25511 Pain in right shoulder: Secondary | ICD-10-CM | POA: Diagnosis not present

## 2018-02-25 DIAGNOSIS — G8929 Other chronic pain: Secondary | ICD-10-CM | POA: Diagnosis not present

## 2018-02-25 DIAGNOSIS — M25611 Stiffness of right shoulder, not elsewhere classified: Secondary | ICD-10-CM | POA: Insufficient documentation

## 2018-02-25 NOTE — Patient Instructions (Addendum)
Weighted Arm Raise    Sit or stand with no weight in one hand. Keeping elbow straight, raise arm above head. Very slowly, return arm to side. Repeat with other arm. Repeat _10-20___ times. Do __2__ sessions per day.   Weight Exercise: Flexion    Lie on back, __0-2__ lb weight in right hand above face. Arm straight as possible, lower weight beyond head. Repeat __10-20__ times. Do __1-2__ sessions per day. Hold last repetitions ___3_ seconds.

## 2018-02-25 NOTE — Therapy (Signed)
[facility] [place], [place], [address] Phone: [phone]   Fax:  [phone]  Physical Therapy Treatment  Patient Details  Name: Virginia Young MRN: [id number] Date of Birth: 1940/08/06 Referring Provider: Esmond Plants MD.   Encounter Date: 02/25/2018  PT End of Session - 02/25/18 1023    Visit Number  26    Number of Visits  28    Date for PT Re-Evaluation  03/11/18    Authorization Type  10TH VISIT PROGRESS NOTE AND KX MODIFIER AFTER THE 15 VISIT.    PT Start Time  [phone]    PT Stop Time  1036    PT Time Calculation (min)  52 min    Activity Tolerance  Patient tolerated treatment well    Behavior During Therapy  WFL for tasks assessed/performed       Past Medical History:  Diagnosis Date  . Allergy   . Atrial fibrillation (HCC)    a. coumadin;  b. Amiodarone  . Cataract   . Chest pain 03/2012   a. Lex MV 10/13:  EF 64%, dist ant and apical defect sugg of soft tissue atten, no ischemia  . Chronic systolic heart failure ([place])   . Depression   . Dysrhythmia   . GERD (gastroesophageal reflux disease)   . HLD (hyperlipidemia)   . Incontinence of urine   . Mild mitral regurgitation   . MS (multiple sclerosis) ([place])   . NICM (nonischemic cardiomyopathy) ([place])    a. neg CLite in 2003;  b. EF 40-45% in past;   c.  Echo 12/11: EF 35-40%, mild MR, mild LAE, mild RAE, small pericardial effusion   . Osteoarthritis   . Osteoporosis   . Stasis ulcer ([place])   . Uterine prolapse   . Vaginal prolapse without uterine prolapse   . Varicose veins     Past Surgical History:  Procedure Laterality Date  . ANTERIOR AND POSTERIOR VAGINAL REPAIR W/ SACROSPINOUS LIGAMENT SUSPENSION  2016   [facility]  . BLADDER SURGERY    . CARPAL TUNNEL RELEASE Right 08/22/2017   Procedure: CARPAL TUNNEL RELEASE;  Surgeon: [name], MD;  Location: [facility];  Service: Orthopedics;  Laterality: Right;  . cataract surgery Bilateral   . EYE SURGERY     . INGUINAL HERNIA REPAIR     right  . LIPOMA EXCISION     h/o removed from upper back x 2  . REVERSE SHOULDER ARTHROPLASTY Right 08/22/2017   Procedure: REVERSE SHOULDER ARTHROPLASTY;  Surgeon: [name], MD;  Location: [facility];  Service: Orthopedics;  Laterality: Right;    There were no vitals filed for this visit.  Subjective Assessment - 02/25/18 0945    Subjective  Patient arrived feeling "little better" than last visit, low energy reported    Pertinent History  MS; OP.    Limitations  Walking;Lifting    How long can you stand comfortably?  <10 minutes.    How long can you walk comfortably?  Short distances.    Diagnostic tests  NCV test to right UE.  X-rays    Patient Stated Goals  Move Right shoulder with less pain.    Currently in Pain?  Yes    Pain Score  3     Pain Orientation  Right    Pain Descriptors / Indicators  Discomfort    Pain Type  Surgical pain    Pain Onset  More than a month ago    Pain Frequency  Intermittent  Aggravating Factors   overhead movement    Pain Relieving Factors  at rest         [facility] PT Assessment - 02/25/18 0001      ROM / Strength   AROM / PROM / Strength  AROM;PROM;Strength      AROM   AROM Assessment Site  Shoulder    Right/Left Shoulder  Right    Right Shoulder Flexion  105 Degrees    Right Shoulder Internal Rotation  80 Degrees    Right Shoulder External Rotation  70 Degrees      PROM   PROM Assessment Site  Shoulder    Right/Left Shoulder  Right    Right Shoulder Flexion  145 Degrees    Right Shoulder External Rotation  75 Degrees      Strength   Strength Assessment Site  Shoulder    Right/Left Shoulder  Right    Right Shoulder Flexion  3-/5    Right Shoulder Internal Rotation  4-/5    Right Shoulder External Rotation  3+/5                   [facility] Adult PT Treatment/Exercise - 02/25/18 0001      Shoulder Exercises: Supine   Protraction  Strengthening;Right;20 reps;10 reps    Flexion   AROM;Right;20 reps    Other Supine Exercises  RtUE at 90 degrees for circles x85min with rests      Shoulder Exercises: Seated   Row  Strengthening;Right;Theraband;Limitations;Other (comment);20 reps    Theraband Level (Shoulder Row)  Level 1 (Yellow)    Protraction  Strengthening;Right;Theraband;Limitations;20 reps    Theraband Level (Shoulder Protraction)  Level 1 (Yellow)    Internal Rotation  Strengthening;Right;20 reps;Theraband    Theraband Level (Shoulder Internal Rotation)  Level 1 (Yellow)    Other Seated Exercises  cane for flexion 90 degrees 2x10      Shoulder Exercises: Pulleys   Flexion  5 minutes      Moist Heat Therapy   Number Minutes Moist Heat  15 Minutes    Moist Heat Location  Shoulder      Electrical Stimulation   Electrical Stimulation Location  R shoulder    Electrical Stimulation Action  premod    Electrical Stimulation Parameters  80-150hz  x40min    Electrical Stimulation Goals  Pain      Manual Therapy   Manual Therapy  Passive ROM    Manual therapy comments  rhythmic stabs for ER/IR in scaption and flex/ext at 90 degrees flexion    Passive ROM  PROM of R shoulder into flex,ER, IR with holds at end range             PT Education - 02/25/18 1023    Education Details  HEP    Person(s) Educated  Patient    Methods  Explanation;Demonstration;Handout    Comprehension  Verbalized understanding;Returned demonstration       PT Short Term Goals - 12/22/17 1428      PT SHORT TERM GOAL #1   Title  STG's=LTG's.        PT Long Term Goals - 02/25/18 0954      PT LONG TERM GOAL #1   Title  Independent with a HEP.    Time  4    Period  Weeks    Status  Achieved      PT LONG TERM GOAL #2   Title  Active right shoulder flexion to 145 degrees so the patient can easily  reach overhead    Time  4    Period  Weeks    Status  On-going   AROM 105 degrees 02/25/18     PT LONG TERM GOAL #3   Title  Active ER to 70 degrees+ to allow for easily  donning/doffing of apparel    Time  4    Period  Weeks    Status  Achieved      PT LONG TERM GOAL #4   Title  Increase right shoulder strength to a solid 4/5 to increase stability for performance of functional activities    Time  4    Period  Weeks    Status  On-going   02/25/18     PT LONG TERM GOAL #5   Title  Perform ADL's with right shoulder pain not > 3/10.    Time  4    Period  Weeks    Status  Achieved            Plan - 02/25/18 1006    Clinical Impression Statement  Patient tolerated treatment well overall with some discomfort with any prolong averhead activity. Patient has improved ROM in right shoulder with all motions. Patient has limitations with right shoulder flexion due to weakness and IR/ER strength deficits. Patient remaining goals ongoing.     Rehab Potential  Fair    PT Frequency  2x / week    PT Duration  8 weeks    PT Treatment/Interventions  ADLs/Self Care Home Management;Cryotherapy;Electrical Stimulation;Ultrasound;Moist Heat;Functional mobility training;Therapeutic activities;Therapeutic exercise;Neuromuscular re-education;Patient/family education;Passive range of motion;Vasopneumatic Device;Manual techniques    PT Next Visit Plan  cont per MD discression, note sent to Dr. Salvatore Marvel and Agree with Plan of Care  Patient       Patient will benefit from skilled therapeutic intervention in order to improve the following deficits and impairments:  Pain, Decreased strength, Impaired UE functional use, Decreased range of motion, Postural dysfunction  Visit Diagnosis: Stiffness of right shoulder, not elsewhere classified  Muscle weakness (generalized)  Chronic right shoulder pain     Problem List Patient Active Problem List   Diagnosis Date Noted  . Dementia with behavioral disturbance 02/10/2018  . S/P shoulder replacement, right 08/22/2017  . HTN (hypertension) 02/19/2016  . Post-nasal drip 05/15/2015  . Postoperative abdominal pain  12/07/2014  . Voiding dysfunction 10/07/2014  . SUI (stress urinary incontinence, female) 10/07/2014  . Osteoporosis with fracture 04/06/2014  . Impacted cerumen of both ears 08/20/2013  . Encounter for therapeutic drug monitoring 08/20/2013  . DOE (dyspnea on exertion) 07/23/2013  . Helicobacter positive gastritis 06/21/2013  . Orthostatic hypotension 06/10/2013  . UTI (lower urinary tract infection) 05/06/2013  . Neck mass 02/19/2013  . Neck pain, bilateral 02/19/2013  . Right groin mass 11/08/2011  . Low back pain 11/08/2011  . Lipoma of neck 06/21/2011  . Memory loss 06/21/2011  . OVERACTIVE BLADDER 08/21/2010  . Long term current use of anticoagulant 07/25/2010  . Osteoarthritis 01/18/2010  . KNEE PAIN 01/18/2010  . FOOT PAIN 01/18/2010  . Hyperlipidemia with target LDL less than 100 04/20/2009  . GERD 04/17/2009  . MITRAL REGURGITATION, MILD 08/30/2008  . NICM (nonischemic cardiomyopathy) ([place]) 08/30/2008  . Congestive heart failure ([place]) 04/13/2007  . DEPRESSION 04/10/2007  . DISEASE, MITRAL VALVE NEC/NOS 04/10/2007  . Atrial fibrillation ([place]) 04/10/2007  . STASIS ULCER 04/10/2007  . VENOUS INSUFFICIENCY, LEGS 04/10/2007    Ladean Raya, PTA 02/25/18 10:39 AM  [facility] [place], [place], [address] Phone: [phone]   Fax:  [phone]  Name: SHAUNTELLE JAMERSON MRN: [id number] Date of Birth: 09/22/1940

## 2018-02-27 ENCOUNTER — Ambulatory Visit
Admission: RE | Admit: 2018-02-27 | Discharge: 2018-02-27 | Disposition: A | Discharge status: Home / Self Care | Payer: Medicare Other | Source: Ambulatory Visit | Attending: Neurology | Admitting: Neurology

## 2018-02-27 DIAGNOSIS — R443 Hallucinations, unspecified: Secondary | ICD-10-CM | POA: Diagnosis not present

## 2018-02-27 DIAGNOSIS — G9340 Encephalopathy, unspecified: Secondary | ICD-10-CM | POA: Diagnosis not present

## 2018-02-28 ENCOUNTER — Telehealth: Payer: Self-pay | Admitting: *Deleted

## 2018-02-28 NOTE — Telephone Encounter (Signed)
Received phone call from patient's daughter stating that patient was recently diagnosed with dementia. Daughter states that patient has been good with her memory all week and today she started hallucinating.  Seeing people move things that they are not moving.  Daughter states that patient is not having any urinary symptoms as this time.  Per Particia Nearing, PA.  Patient will experience these episodes often, to try to divert her attention to something else.  Patient needs someone with her during these episode.  Daughter verbalized understanding.

## 2018-03-02 ENCOUNTER — Encounter: Payer: Self-pay | Admitting: Physical Therapy

## 2018-03-02 ENCOUNTER — Ambulatory Visit: Payer: Medicare Other | Admitting: Physical Therapy

## 2018-03-02 ENCOUNTER — Other Ambulatory Visit: Payer: Self-pay | Admitting: Family Medicine

## 2018-03-02 ENCOUNTER — Telehealth: Payer: Self-pay | Admitting: *Deleted

## 2018-03-02 ENCOUNTER — Telehealth: Payer: Self-pay | Admitting: Neurology

## 2018-03-02 ENCOUNTER — Other Ambulatory Visit: Payer: Self-pay | Admitting: *Deleted

## 2018-03-02 ENCOUNTER — Other Ambulatory Visit: Payer: Medicare Other

## 2018-03-02 DIAGNOSIS — R399 Unspecified symptoms and signs involving the genitourinary system: Secondary | ICD-10-CM

## 2018-03-02 DIAGNOSIS — M25511 Pain in right shoulder: Secondary | ICD-10-CM | POA: Diagnosis not present

## 2018-03-02 DIAGNOSIS — M6281 Muscle weakness (generalized): Secondary | ICD-10-CM

## 2018-03-02 DIAGNOSIS — G8929 Other chronic pain: Secondary | ICD-10-CM | POA: Diagnosis not present

## 2018-03-02 DIAGNOSIS — M25611 Stiffness of right shoulder, not elsewhere classified: Secondary | ICD-10-CM

## 2018-03-02 LAB — MICROSCOPIC EXAMINATION: WBC, UA: >30 /hpf — AB (ref 0–5)

## 2018-03-02 LAB — URINALYSIS, COMPLETE
Bilirubin, UA: NEGATIVE
Glucose, UA: NEGATIVE
Ketones, UA: NEGATIVE
Nitrite, UA: POSITIVE — AB
Specific Gravity, UA: 1.01 (ref 1.005–1.030)
Urobilinogen, Ur: 0.2 mg/dL (ref 0.2–1.0)
pH, UA: 6 (ref 5.0–7.5)

## 2018-03-02 MED ORDER — QUETIAPINE FUMARATE 50 MG PO TABS: 50.0000 mg | ORAL_TABLET | Freq: Two times a day (BID) | ORAL | 11 refills | Status: DC

## 2018-03-02 MED ORDER — AMOXICILLIN 500 MG PO CAPS: 500.0000 mg | ORAL_CAPSULE | Freq: Three times a day (TID) | ORAL | 0 refills | Status: DC

## 2018-03-02 NOTE — Addendum Note (Signed)
Addended by: Gildardo Griffes on: 03/02/2018 12:13 PM   Modules accepted: Orders

## 2018-03-02 NOTE — Patient Instructions (Addendum)
  INTERNAL ROTATION: Sitting - Resistance Band (Active)    Sit, right arm bent to 90, elbow against side, forearm out from body. Against yellow resistance band, rotate arm in to body, keeping elbow at side. Complete _10__ sets of _2_ repetitions. Perform ___ sessions per day.  Copyright  VHI. All rights reserved.  EXTERNAL ROTATION: Sitting - Resistance Band (Active)    Sit, right arm elbow bent to 90, elbow against side, hand forward. Against yellow resistance band, rotate forearm outward, keeping elbow at side. Rotate forearm outward as far as possible. Complete _10__ sets of _2__ repetitions. Perform __2_ sessions per day.  EXTENSION: Sitting - Resistance Band (Active)    Sit with right arm at side. Against yellow resistance band, draw arm backward, as far as possible, keeping elbow straight. Complete _10__ sets of __2_ repetitions. Perform __2_ sessions per day.  Copyright  VHI. All rights reserved.  FLEXION: Sitting - Resistance Band (Active)    Sit with right arm down. Against yellow resistance band, lift arm forward and up as high as possible, keeping elbow straight. Complete __10_ sets of _2__ repetitions. Perform _2__ sessions per day.  Copyright  VHI. All rights reserved.

## 2018-03-02 NOTE — Therapy (Signed)
[facility] [place], [place], [address] Phone: [phone]   Fax:  [phone]  Physical Therapy Treatment  Patient Details  Name: Virginia Young MRN: [id number] Date of Birth: 01-31-1941 Referring Provider: Esmond Plants MD.   Encounter Date: 03/02/2018  PT End of Session - 03/02/18 1024    Visit Number  27    Number of Visits  28    Date for PT Re-Evaluation  03/11/18    Authorization Type  10TH VISIT PROGRESS NOTE AND KX MODIFIER AFTER THE 15 VISIT.    PT Start Time  [phone]    PT Stop Time  1034    PT Time Calculation (min)  48 min    Activity Tolerance  Patient tolerated treatment well    Behavior During Therapy  WFL for tasks assessed/performed       Past Medical History:  Diagnosis Date  . Allergy   . Atrial fibrillation (HCC)    a. coumadin;  b. Amiodarone  . Cataract   . Chest pain 03/2012   a. Lex MV 10/13:  EF 64%, dist ant and apical defect sugg of soft tissue atten, no ischemia  . Chronic systolic heart failure ([place])   . Depression   . Dysrhythmia   . GERD (gastroesophageal reflux disease)   . HLD (hyperlipidemia)   . Incontinence of urine   . Mild mitral regurgitation   . MS (multiple sclerosis) ([place])   . NICM (nonischemic cardiomyopathy) ([place])    a. neg CLite in 2003;  b. EF 40-45% in past;   c.  Echo 12/11: EF 35-40%, mild MR, mild LAE, mild RAE, small pericardial effusion   . Osteoarthritis   . Osteoporosis   . Stasis ulcer ([place])   . Uterine prolapse   . Vaginal prolapse without uterine prolapse   . Varicose veins     Past Surgical History:  Procedure Laterality Date  . ANTERIOR AND POSTERIOR VAGINAL REPAIR W/ SACROSPINOUS LIGAMENT SUSPENSION  2016   [facility]  . BLADDER SURGERY    . CARPAL TUNNEL RELEASE Right 08/22/2017   Procedure: CARPAL TUNNEL RELEASE;  Surgeon: [name], MD;  Location: [facility];  Service: Orthopedics;  Laterality: Right;  . cataract surgery Bilateral   . EYE SURGERY     . INGUINAL HERNIA REPAIR     right  . LIPOMA EXCISION     h/o removed from upper back x 2  . REVERSE SHOULDER ARTHROPLASTY Right 08/22/2017   Procedure: REVERSE SHOULDER ARTHROPLASTY;  Surgeon: [name], MD;  Location: [facility];  Service: Orthopedics;  Laterality: Right;    There were no vitals filed for this visit.  Subjective Assessment - 03/02/18 1006    Subjective  Patient reported no complaints after last treatment and doing well today    Pertinent History  MS; OP.    Limitations  Walking;Lifting    How long can you stand comfortably?  <10 minutes.    How long can you walk comfortably?  Short distances.    Diagnostic tests  NCV test to right UE.  X-rays    Patient Stated Goals  Move Right shoulder with less pain.    Currently in Pain?  Yes    Pain Score  2     Pain Location  Shoulder    Pain Orientation  Right    Pain Descriptors / Indicators  Discomfort    Pain Type  Surgical pain    Pain Onset  More than a month ago  Pain Frequency  Intermittent    Aggravating Factors   overhead    Pain Relieving Factors  at rest                       [facility] Adult PT Treatment/Exercise - 03/02/18 0001      Exercises   Exercises  Shoulder      Shoulder Exercises: Supine   Protraction  Strengthening;Right;20 reps;10 reps    External Rotation  Strengthening;Right;20 reps;Theraband    Theraband Level (Shoulder External Rotation)  Level 1 (Yellow)    External Rotation Limitations  educational cues required    Flexion  AROM;Right;20 reps;10 reps    Other Supine Exercises  RtUE at 90 degrees for circles x15min with rests      Shoulder Exercises: Seated   Row  Strengthening;Right;Theraband;Limitations;Other (comment);20 reps    Theraband Level (Shoulder Row)  Level 1 (Yellow)    Protraction  Strengthening;Right;Theraband;Limitations;20 reps    Theraband Level (Shoulder Protraction)  Level 1 (Yellow)    Internal Rotation  Strengthening;Right;20 reps;Theraband     Theraband Level (Shoulder Internal Rotation)  Level 1 (Yellow)    Other Seated Exercises  cane for flexion 90 degrees 2x10      Shoulder Exercises: Pulleys   Flexion  5 minutes      Moist Heat Therapy   Number Minutes Moist Heat  15 Minutes    Moist Heat Location  Shoulder      Electrical Stimulation   Electrical Stimulation Location  R shoulder    Electrical Stimulation Action  premod    Electrical Stimulation Parameters  80-150hz  x49min    Electrical Stimulation Goals  Pain      Manual Therapy   Manual Therapy  Passive ROM    Manual therapy comments  rhythmic stabs for ER/IR in scaption and flex/ext at 90 degrees flexion    Passive ROM  PROM of R shoulder into flex,ER, IR with holds at end range             PT Education - 03/02/18 1023    Education Details  advanced HEP    Person(s) Educated  Patient    Methods  Demonstration;Explanation;Handout    Comprehension  Verbalized understanding;Returned demonstration       PT Short Term Goals - 12/22/17 1428      PT SHORT TERM GOAL #1   Title  STG's=LTG's.        PT Long Term Goals - 02/25/18 0954      PT LONG TERM GOAL #1   Title  Independent with a HEP.    Time  4    Period  Weeks    Status  Achieved      PT LONG TERM GOAL #2   Title  Active right shoulder flexion to 145 degrees so the patient can easily reach overhead    Time  4    Period  Weeks    Status  On-going   AROM 105 degrees 02/25/18     PT LONG TERM GOAL #3   Title  Active ER to 70 degrees+ to allow for easily donning/doffing of apparel    Time  4    Period  Weeks    Status  Achieved      PT LONG TERM GOAL #4   Title  Increase right shoulder strength to a solid 4/5 to increase stability for performance of functional activities    Time  4    Period  Weeks  Status  On-going   02/25/18     PT LONG TERM GOAL #5   Title  Perform ADL's with right shoulder pain not > 3/10.    Time  4    Period  Weeks    Status  Achieved             Plan - 03/02/18 1025    Clinical Impression Statement  Patient tolerated treatment well today. Patient able to perform all exercises well today with some educational cues for new exercise. Patient issued HEP with yellow t-band to progress with home exercises. Patient has a nurse that stays with her that can assist with her daily exercises. Patient has difficulty with standing exercises due to other medical issue with knees. Patient remaining goals ongoing due to strength deficts.     Rehab Potential  Fair    PT Frequency  2x / week    PT Duration  8 weeks    PT Treatment/Interventions  ADLs/Self Care Home Management;Cryotherapy;Electrical Stimulation;Ultrasound;Moist Heat;Functional mobility training;Therapeutic activities;Therapeutic exercise;Neuromuscular re-education;Patient/family education;Passive range of motion;Vasopneumatic Device;Manual techniques    PT Next Visit Plan  cont per PT discresssion/DC    Consulted and Agree with Plan of Care  Patient       Patient will benefit from skilled therapeutic intervention in order to improve the following deficits and impairments:  Pain, Decreased strength, Impaired UE functional use, Decreased range of motion, Postural dysfunction  Visit Diagnosis: Stiffness of right shoulder, not elsewhere classified  Muscle weakness (generalized)  Chronic right shoulder pain     Problem List Patient Active Problem List   Diagnosis Date Noted  . Dementia with behavioral disturbance 02/10/2018  . S/P shoulder replacement, right 08/22/2017  . HTN (hypertension) 02/19/2016  . Post-nasal drip 05/15/2015  . Postoperative abdominal pain 12/07/2014  . Voiding dysfunction 10/07/2014  . SUI (stress urinary incontinence, female) 10/07/2014  . Osteoporosis with fracture 04/06/2014  . Impacted cerumen of both ears 08/20/2013  . Encounter for therapeutic drug monitoring 08/20/2013  . DOE (dyspnea on exertion) 07/23/2013  . Helicobacter positive  gastritis 06/21/2013  . Orthostatic hypotension 06/10/2013  . UTI (lower urinary tract infection) 05/06/2013  . Neck mass 02/19/2013  . Neck pain, bilateral 02/19/2013  . Right groin mass 11/08/2011  . Low back pain 11/08/2011  . Lipoma of neck 06/21/2011  . Memory loss 06/21/2011  . OVERACTIVE BLADDER 08/21/2010  . Long term current use of anticoagulant 07/25/2010  . Osteoarthritis 01/18/2010  . KNEE PAIN 01/18/2010  . FOOT PAIN 01/18/2010  . Hyperlipidemia with target LDL less than 100 04/20/2009  . GERD 04/17/2009  . MITRAL REGURGITATION, MILD 08/30/2008  . NICM (nonischemic cardiomyopathy) ([place]) 08/30/2008  . Congestive heart failure ([place]) 04/13/2007  . DEPRESSION 04/10/2007  . DISEASE, MITRAL VALVE NEC/NOS 04/10/2007  . Atrial fibrillation ([place]) 04/10/2007  . STASIS ULCER 04/10/2007  . VENOUS INSUFFICIENCY, LEGS 04/10/2007    Denece Shearer P, PTA 03/02/2018, 10:37 AM  [facility] [place], [place], [address] Phone: [phone]   Fax:  [phone]  Name: Virginia Young MRN: [id number] Date of Birth: 06-02-1941

## 2018-03-02 NOTE — Telephone Encounter (Signed)
Left message for [name] to call back. Lab received urine specimen. No call stating if patient is having any type of symptoms.

## 2018-03-02 NOTE — Telephone Encounter (Signed)
Spoke with Dr. Jaynee Eagles. Will increase Seroquel to 50 mg BID.   Returned call to daughter [name]. She stated that the patient's nurse that stays with her at home thinks the seroquel needs to be increased. She is still having hallucinations. She has run around outside. Discussed Dr. Cathren Laine order to increase the Seroquel to 50 mg twice a day instead of the current 25 mg twice a day. Pam verbalized understanding and appreciation. Also reviewed MRI results as follows: MRI has not changed since 07/2016. Nothing else but Alzheimer's to explain her dementia. Discussed that pt may decline with dementia however daughter concerned of acute changes. RN advised for pt to be seen by general practitioner to r/o any infections such as UTI. Pam verbalized understanding and appreciation.   Seroquel 25 mg prescription canceled and new prescription written for Seroquel 50 mg per v.o Dr. Jaynee Eagles. Included note to pharmacy that the 25 mg tablet has been canceled.

## 2018-03-02 NOTE — Telephone Encounter (Signed)
Error

## 2018-03-02 NOTE — Telephone Encounter (Signed)
Pam patient's daughter (on [place]) calling to discuss QUEtiapine (SEROQUEL) 25 MG tablet being adjusted. Patient has been having hallucinations.

## 2018-03-03 ENCOUNTER — Telehealth: Payer: Self-pay | Admitting: Pediatrics

## 2018-03-03 NOTE — Telephone Encounter (Signed)
Positive for infection. Antibiotic sent in

## 2018-03-03 NOTE — Telephone Encounter (Signed)
Daughter aware that patient is positive for infection and that an antibiotic has been sent to [facility]

## 2018-03-04 ENCOUNTER — Other Ambulatory Visit: Payer: Self-pay | Admitting: Family Medicine

## 2018-03-04 ENCOUNTER — Ambulatory Visit: Payer: Medicare Other | Admitting: Physical Therapy

## 2018-03-04 ENCOUNTER — Encounter: Payer: Self-pay | Admitting: Physical Therapy

## 2018-03-04 DIAGNOSIS — M25511 Pain in right shoulder: Secondary | ICD-10-CM | POA: Diagnosis not present

## 2018-03-04 DIAGNOSIS — M6281 Muscle weakness (generalized): Secondary | ICD-10-CM

## 2018-03-04 DIAGNOSIS — G8929 Other chronic pain: Secondary | ICD-10-CM | POA: Diagnosis not present

## 2018-03-04 DIAGNOSIS — M25611 Stiffness of right shoulder, not elsewhere classified: Secondary | ICD-10-CM

## 2018-03-04 LAB — URINE CULTURE

## 2018-03-04 MED ORDER — CIPROFLOXACIN HCL 500 MG PO TABS: 500.0000 mg | ORAL_TABLET | Freq: Two times a day (BID) | ORAL | 0 refills | Status: DC

## 2018-03-04 NOTE — Therapy (Addendum)
[facility] [place], [place], [address] Phone: [phone]   Fax:  [phone]  Physical Therapy Treatment  Patient Details  Name: Virginia Young MRN: [id number] Date of Birth: 02-28-1941 Referring Provider: Esmond Plants MD.   Encounter Date: 03/04/2018  PT End of Session - 03/04/18 1002    Visit Number  28    Number of Visits  28    Date for PT Re-Evaluation  03/11/18    Authorization Type  10TH VISIT PROGRESS NOTE AND KX MODIFIER AFTER THE 15 VISIT.    PT Start Time  [id number]    PT Stop Time  1033    PT Time Calculation (min)  45 min    Activity Tolerance  Patient tolerated treatment well    Behavior During Therapy  WFL for tasks assessed/performed       Past Medical History:  Diagnosis Date  . Allergy   . Atrial fibrillation (HCC)    a. coumadin;  b. Amiodarone  . Cataract   . Chest pain 03/2012   a. Lex MV 10/13:  EF 64%, dist ant and apical defect sugg of soft tissue atten, no ischemia  . Chronic systolic heart failure ([place])   . Depression   . Dysrhythmia   . GERD (gastroesophageal reflux disease)   . HLD (hyperlipidemia)   . Incontinence of urine   . Mild mitral regurgitation   . MS (multiple sclerosis) ([place])   . NICM (nonischemic cardiomyopathy) ([place])    a. neg CLite in 2003;  b. EF 40-45% in past;   c.  Echo 12/11: EF 35-40%, mild MR, mild LAE, mild RAE, small pericardial effusion   . Osteoarthritis   . Osteoporosis   . Stasis ulcer ([place])   . Uterine prolapse   . Vaginal prolapse without uterine prolapse   . Varicose veins     Past Surgical History:  Procedure Laterality Date  . ANTERIOR AND POSTERIOR VAGINAL REPAIR W/ SACROSPINOUS LIGAMENT SUSPENSION  2016   [facility]  . BLADDER SURGERY    . CARPAL TUNNEL RELEASE Right 08/22/2017   Procedure: CARPAL TUNNEL RELEASE;  Surgeon: [name], MD;  Location: [facility];  Service: Orthopedics;  Laterality: Right;  . cataract surgery Bilateral   . EYE  SURGERY    . INGUINAL HERNIA REPAIR     right  . LIPOMA EXCISION     h/o removed from upper back x 2  . REVERSE SHOULDER ARTHROPLASTY Right 08/22/2017   Procedure: REVERSE SHOULDER ARTHROPLASTY;  Surgeon: [name], MD;  Location: [facility];  Service: Orthopedics;  Laterality: Right;    There were no vitals filed for this visit.  Subjective Assessment - 03/04/18 0956    Subjective  Patient reported improvement with some remaining soreness esp with overhead movement    Pertinent History  MS; OP.    Limitations  Walking;Lifting    How long can you stand comfortably?  <10 minutes.    How long can you walk comfortably?  Short distances.    Diagnostic tests  NCV test to right UE.  X-rays    Patient Stated Goals  Move Right shoulder with less pain.    Currently in Pain?  Yes    Pain Score  2     Pain Location  Shoulder    Pain Orientation  Right    Pain Descriptors / Indicators  Sore;Discomfort    Pain Type  Surgical pain    Pain Onset  More than a month ago  Pain Frequency  Intermittent    Aggravating Factors   overhead movement    Pain Relieving Factors  at rest         [facility] PT Assessment - 03/04/18 0001      AROM   AROM Assessment Site  Shoulder    Right/Left Shoulder  Right    Right Shoulder Flexion  106 Degrees    Right Shoulder Internal Rotation  80 Degrees    Right Shoulder External Rotation  70 Degrees      PROM   PROM Assessment Site  Shoulder    Right/Left Shoulder  Right    Right Shoulder Flexion  145 Degrees    Right Shoulder External Rotation  75 Degrees      Strength   Strength Assessment Site  Shoulder    Right/Left Shoulder  Right    Right Shoulder Flexion  3-/5    Right Shoulder Internal Rotation  4-/5    Right Shoulder External Rotation  3+/5                   [facility] Adult PT Treatment/Exercise - 03/04/18 0001      Shoulder Exercises: Supine   Protraction  Strengthening;Right;20 reps;10 reps    External Rotation   Strengthening;Right;20 reps;Theraband    Theraband Level (Shoulder External Rotation)  Level 1 (Yellow)    External Rotation Limitations  educational cues required    Flexion  AROM;Right;20 reps;10 reps    Other Supine Exercises  RtUE at 90 degrees for circles x55mn with rests      Shoulder Exercises: Seated   Row  Strengthening;Right;Theraband;Limitations;Other (comment);20 reps    Theraband Level (Shoulder Row)  Level 1 (Yellow)    Protraction  Strengthening;Right;Theraband;Limitations;20 reps    Theraband Level (Shoulder Protraction)  Level 1 (Yellow)    Internal Rotation  Strengthening;Right;20 reps;Theraband    Theraband Level (Shoulder Internal Rotation)  Level 1 (Yellow)    Other Seated Exercises  cane for flexion 90 degrees 2x10      Shoulder Exercises: Pulleys   Flexion  5 minutes      Moist Heat Therapy   Number Minutes Moist Heat  15 Minutes    Moist Heat Location  Shoulder      Electrical Stimulation   Electrical Stimulation Location  R shoulder    Electrical Stimulation Action  premod    Electrical Stimulation Parameters  80-150hz  x140m    Electrical Stimulation Goals  Pain      Manual Therapy   Manual Therapy  Passive ROM    Manual therapy comments  rhythmic stabs for ER/IR in scaption and flex/ext at 90 degrees flexion    Passive ROM  PROM of R shoulder into flex,ER, IR with holds at end range               PT Short Term Goals - 12/22/17 1428      PT SHORT TERM GOAL #1   Title  STG's=LTG's.        PT Long Term Goals - 03/04/18 1006      PT LONG TERM GOAL #1   Title  Independent with a HEP.    Baseline  instructed in table slides, isometrics 4 way    Time  4    Period  Weeks    Status  Achieved      PT LONG TERM GOAL #2   Title  Active right shoulder flexion to 145 degrees so the patient can easily reach overhead  Time  4    Status  Not Met   104 03/04/18     PT LONG TERM GOAL #3   Title  Active ER to 70 degrees+ to allow for easily  donning/doffing of apparel    Time  4    Period  Weeks    Status  Achieved      PT LONG TERM GOAL #4   Title  Increase right shoulder strength to a solid 4/5 to increase stability for performance of functional activities    Time  4    Period  Weeks    Status  Not Met      PT LONG TERM GOAL #5   Title  Perform ADL's with right shoulder pain not > 3/10.    Time  4    Period  Weeks    Status  Achieved            Plan - 03/04/18 1015    Clinical Impression Statement  Patient tolerated treatment well today with no reported increased discomfort. Patient required educational cues to stay on task and for technique yet able to complete all exercises. Patient is able to reach up yet with some sorness. Today educated patient on daily exercises and activity with her caregiver, printed HEP for caregiver today. Patient has met all goals except flexion and strength goals. Patient to be DC today to HEP per PT.     PT Frequency  2x / week    PT Duration  8 weeks    PT Treatment/Interventions  ADLs/Self Care Home Management;Cryotherapy;Electrical Stimulation;Ultrasound;Moist Heat;Functional mobility training;Therapeutic activities;Therapeutic exercise;Neuromuscular re-education;Patient/family education;Passive range of motion;Vasopneumatic Device;Manual techniques    PT Next Visit Plan  DC    Consulted and Agree with Plan of Care  Patient       Patient will benefit from skilled therapeutic intervention in order to improve the following deficits and impairments:  Pain, Decreased strength, Impaired UE functional use, Decreased range of motion, Postural dysfunction  Visit Diagnosis: Stiffness of right shoulder, not elsewhere classified  Muscle weakness (generalized)  Chronic right shoulder pain     Problem List Patient Active Problem List   Diagnosis Date Noted  . Dementia with behavioral disturbance 02/10/2018  . S/P shoulder replacement, right 08/22/2017  . HTN (hypertension)  02/19/2016  . Post-nasal drip 05/15/2015  . Postoperative abdominal pain 12/07/2014  . Voiding dysfunction 10/07/2014  . SUI (stress urinary incontinence, female) 10/07/2014  . Osteoporosis with fracture 04/06/2014  . Impacted cerumen of both ears 08/20/2013  . Encounter for therapeutic drug monitoring 08/20/2013  . DOE (dyspnea on exertion) 07/23/2013  . Helicobacter positive gastritis 06/21/2013  . Orthostatic hypotension 06/10/2013  . UTI (lower urinary tract infection) 05/06/2013  . Neck mass 02/19/2013  . Neck pain, bilateral 02/19/2013  . Right groin mass 11/08/2011  . Low back pain 11/08/2011  . Lipoma of neck 06/21/2011  . Memory loss 06/21/2011  . OVERACTIVE BLADDER 08/21/2010  . Long term current use of anticoagulant 07/25/2010  . Osteoarthritis 01/18/2010  . KNEE PAIN 01/18/2010  . FOOT PAIN 01/18/2010  . Hyperlipidemia with target LDL less than 100 04/20/2009  . GERD 04/17/2009  . MITRAL REGURGITATION, MILD 08/30/2008  . NICM (nonischemic cardiomyopathy) ([place]) 08/30/2008  . Congestive heart failure ([place]) 04/13/2007  . DEPRESSION 04/10/2007  . DISEASE, MITRAL VALVE NEC/NOS 04/10/2007  . Atrial fibrillation ([place]) 04/10/2007  . STASIS ULCER 04/10/2007  . VENOUS INSUFFICIENCY, LEGS 04/10/2007    Ladean Raya, PTA 03/04/18 10:33 AM  [facility] [place], [place], [address] Phone: [phone]   Fax:  [phone]  Name: Virginia Young MRN: [id number] Date of Birth: [birth year]  PHYSICAL THERAPY DISCHARGE SUMMARY  Visits from Start of Care: 28.  Current functional level related to goals / functional outcomes: See above.   Remaining deficits: LTG's #1, 3 and 5 met.  Patient continues to lack right shoulder flexion limited to 104 degrees and she has a continued strength deficit.   Education / Equipment: HEP. Plan: Patient agrees to discharge.  Patient goals were partially met. Patient is  being discharged due to being pleased with the current functional level.  ?????          Mali Applegate MPT

## 2018-03-11 ENCOUNTER — Encounter: Payer: Self-pay | Admitting: Pediatrics

## 2018-03-11 ENCOUNTER — Ambulatory Visit (INDEPENDENT_AMBULATORY_CARE_PROVIDER_SITE_OTHER): Payer: Medicare Other | Admitting: Pediatrics

## 2018-03-11 VITALS — BP 136/91 | HR 84 | Temp 96.9°F | Ht 71.0 in | Wt 174.8 lb

## 2018-03-11 DIAGNOSIS — R443 Hallucinations, unspecified: Secondary | ICD-10-CM | POA: Diagnosis not present

## 2018-03-11 DIAGNOSIS — R29898 Other symptoms and signs involving the musculoskeletal system: Secondary | ICD-10-CM

## 2018-03-11 NOTE — Progress Notes (Signed)
Subjective:   Patient ID: Virginia Young, female    DOB: April 27, 1941, 77 y.o.   MRN: [id number] CC: Numbness (Right Arm) and Dizziness  HPI: Virginia Young is a 77 y.o. female   Here today with nursing aide.  Now has nurse present with her during the day while awake.  Family working on getting coverage for overnight as well.  Patient says she is most bothered by right arm numbness and tingling.  Is been going on like this ever since she had the surgery on her shoulder.  The nurse with her today says she was not complaining about the right arm and shoulder pain as much when she was doing physical therapy.  Recent brain MRI with chronic lacunar infarction in the posterior left thalamus new since 07/2016 MRI.  Sometimes having some dizziness after taking her medicines in the morning.  Did not have any dizziness today.  Nurse describes ongoing discussion most mornings about if she is taking her medicines yet or not.  Not currently using a pillbox at home.  Has a nurse with her every day during the day.  Or so she might be taking it twice in the morning some days.  She does have some twice a day medicines, taking them in the morning and then again in the evening when the nurse goes home.  Continues to have some hallucinations, sitting on the porch she thought sprinklers were on when they were not.  Finished antibiotic treatment of UTI yesterday.  No dysuria, frequency urgency past few days.   Relevant past medical, surgical, family and social history reviewed. Allergies and medications reviewed and updated. Social History   Tobacco Use  Smoking Status Never Smoker  Smokeless Tobacco Never Used   ROS: Per HPI   Objective:    BP (!) 136/91   Pulse 84   Temp (!) 96.9 F (36.1 C) (Oral)   Ht 5\' 11"  (1.803 m)   Wt 174 lb 12.8 oz (79.3 kg)   BMI 24.38 kg/m   Wt Readings from Last 3 Encounters:  03/11/18 174 lb 12.8 oz (79.3 kg)  02/10/18 172 lb (78 kg)  02/02/18 175 lb (79.4 kg)     Gen: NAD, alert, cooperative with exam, NCAT EYES: EOMI, no conjunctival injection, or no icterus ENT:  TMs pearly gray b/l, OP without erythema LYMPH: no cervical LAD CV: NRRR, normal S1/S2, no murmur, distal pulses 2+ b/l Resp: CTABL, no wheezes, normal WOB Abd: +BS, soft, NTND. no guarding or organomegaly Ext: No edema, warm Neuro: Alert and oriented, strength equal b/l UE and LE, coordination grossly normal MSK: normal muscle bulk  Assessment & Plan:  Charrie was seen today for numbness and dizziness.  Diagnoses and all orders for this visit:  Right arm weakness Possible that previous left thalamus infarct to be contributing to some of symptoms.  His symptoms were better when she was doing physical therapy, will repeat physical therapy referral, recommend that she get an habit of doing exercises at home regularly as well to prevent exacerbation of pain with therapy stops next time. -     Ambulatory referral to Physical Therapy  Hallucinations Now on Seroquel twice a day, some improvement in hallucinations.  Following with neurology..  Having some dizziness vs fatigue in the morning, has to sit down or lie down on the sofa, sometimes falls asleep.  Not happening every day, did not happen today.  Unclear if she is taking her medicines as prescribed or not, may be  taking extra doses by accident per nurse.  Recommended starting pillbox.  If nurses or family are with her during the day, they should distribute the medicines, medicine should be kept out of sight of patient to avoid repeat doses.  I spent 25 minutes with the patient with over 50% of the encounter time dedicated to counseling on the above problems.   Follow up plan: Return in about 3 months (around 06/10/2018). Assunta Found, MD [place]

## 2018-03-11 NOTE — Patient Instructions (Signed)
Keep medicines out of sight to ensure not taking repeat doses inappropriately, use pill box to help with this

## 2018-03-25 ENCOUNTER — Other Ambulatory Visit: Payer: Self-pay | Admitting: Neurology

## 2018-03-28 ENCOUNTER — Other Ambulatory Visit: Payer: Self-pay | Admitting: Pediatrics

## 2018-03-28 DIAGNOSIS — I480 Paroxysmal atrial fibrillation: Secondary | ICD-10-CM

## 2018-04-06 ENCOUNTER — Encounter: Payer: Self-pay | Admitting: Physical Therapy

## 2018-04-06 ENCOUNTER — Ambulatory Visit: Payer: Medicare Other | Attending: Pediatrics | Admitting: Physical Therapy

## 2018-04-06 DIAGNOSIS — M25611 Stiffness of right shoulder, not elsewhere classified: Secondary | ICD-10-CM | POA: Diagnosis not present

## 2018-04-06 DIAGNOSIS — G8929 Other chronic pain: Secondary | ICD-10-CM | POA: Insufficient documentation

## 2018-04-06 DIAGNOSIS — M25511 Pain in right shoulder: Secondary | ICD-10-CM | POA: Insufficient documentation

## 2018-04-06 DIAGNOSIS — R293 Abnormal posture: Secondary | ICD-10-CM | POA: Diagnosis not present

## 2018-04-06 DIAGNOSIS — M6281 Muscle weakness (generalized): Secondary | ICD-10-CM | POA: Diagnosis not present

## 2018-04-06 DIAGNOSIS — M545 Low back pain: Secondary | ICD-10-CM | POA: Diagnosis not present

## 2018-04-06 NOTE — Therapy (Addendum)
[facility] [place], [place], [address] Phone: [phone]   Fax:  [phone]  Physical Therapy Evaluation  Patient Details  Name: Virginia Young MRN: [id number] Date of Birth: 1940-12-06 Referring Provider (PT): Assunta Found, MD   Encounter Date: 04/06/2018  PT End of Session - 04/06/18 1023    Visit Number  1    Number of Visits  6    Date for PT Re-Evaluation  05/04/18    Authorization Type  KX MODIFIER FOR ALL VISITS    PT Start Time  0945    PT Stop Time  1038    PT Time Calculation (min)  53 min    Activity Tolerance  Patient tolerated treatment well    Behavior During Therapy  [facility] for tasks assessed/performed       Past Medical History:  Diagnosis Date  . Allergy   . Atrial fibrillation (HCC)    a. coumadin;  b. Amiodarone  . Cataract   . Chest pain 03/2012   a. Lex MV 10/13:  EF 64%, dist ant and apical defect sugg of soft tissue atten, no ischemia  . Chronic systolic heart failure ([place])   . Depression   . Dysrhythmia   . GERD (gastroesophageal reflux disease)   . HLD (hyperlipidemia)   . Incontinence of urine   . Mild mitral regurgitation   . MS (multiple sclerosis) ([place])   . NICM (nonischemic cardiomyopathy) ([place])    a. neg CLite in 2003;  b. EF 40-45% in past;   c.  Echo 12/11: EF 35-40%, mild MR, mild LAE, mild RAE, small pericardial effusion   . Osteoarthritis   . Osteoporosis   . Stasis ulcer ([place])   . Uterine prolapse   . Vaginal prolapse without uterine prolapse   . Varicose veins     Past Surgical History:  Procedure Laterality Date  . ANTERIOR AND POSTERIOR VAGINAL REPAIR W/ SACROSPINOUS LIGAMENT SUSPENSION  2016   [facility]  . BLADDER SURGERY    . CARPAL TUNNEL RELEASE Right 08/22/2017   Procedure: CARPAL TUNNEL RELEASE;  Surgeon: [name], MD;  Location: [facility];  Service: Orthopedics;  Laterality: Right;  . cataract surgery Bilateral   . EYE SURGERY    . INGUINAL HERNIA  REPAIR     right  . LIPOMA EXCISION     h/o removed from upper back x 2  . REVERSE SHOULDER ARTHROPLASTY Right 08/22/2017   Procedure: REVERSE SHOULDER ARTHROPLASTY;  Surgeon: [name], MD;  Location: [facility];  Service: Orthopedics;  Laterality: Right;    There were no vitals filed for this visit.   Subjective Assessment - 04/06/18 1906    Subjective  Patient arrives to physical therapy with reports of right shoulder soreness and decreased AROM secondary to reverse total shoulder replacement on 08/22/17. Patient reports she can perform all activities but reports increased time to perform activity. Patient states her shoulder gets fatigued quickly as she performs ADLs. Patient reports she has not been performing her HEP provided during last episode of care and stated she does not have any help to perform them and later stated she does them when she remembers to do them. Patient's goals are to improve motion, improve strength to return to PLOF.    Pertinent History  right reverse total shoulder 08/22/17; MS; OP.    Limitations  Lifting;House hold activities    Patient Stated Goals  Move shoulder better.    Currently in Pain?  Yes  Pain Score  2     Pain Location  Shoulder    Pain Orientation  Right    Pain Descriptors / Indicators  Sore    Pain Type  Surgical pain    Pain Onset  More than a month ago    Pain Frequency  Intermittent         [facility] PT Assessment - 04/06/18 0001      Assessment   Medical Diagnosis  Right arm weakness    Referring Provider (PT)  Assunta Found, MD    Onset Date/Surgical Date  08/22/17    Hand Dominance  Right    Prior Therapy  Yes      Precautions   Precautions  None      Restrictions   Weight Bearing Restrictions  No      Balance Screen   Has the patient fallen in the past 6 months  No    Has the patient had a decrease in activity level because of a fear of falling?   No    Is the patient reluctant to leave their home because of a fear of  falling?   No      Home Film/video editor residence      Prior Function   Level of Independence  Independent with basic ADLs      ROM / Strength   AROM / PROM / Strength  AROM;PROM;Strength      AROM   AROM Assessment Site  Shoulder    Right/Left Shoulder  Right    Right Shoulder Flexion  80 Degrees    Right Shoulder ABduction  68 Degrees    Right Shoulder Internal Rotation  72 Degrees    Right Shoulder External Rotation  35 Degrees      PROM   PROM Assessment Site  Shoulder    Right/Left Shoulder  Right    Right Shoulder Flexion  124 Degrees    Right Shoulder External Rotation  60 Degrees      Strength   Right/Left Shoulder  Right    Right Shoulder Flexion  3-/5    Right Shoulder ABduction  3-/5    Right Shoulder Internal Rotation  3/5    Right Shoulder External Rotation  3/5                Objective measurements completed on examination: See above findings.      [facility] Adult PT Treatment/Exercise - 04/06/18 0001      Exercises   Exercises  Shoulder      Shoulder Exercises: Seated   Elevation  Strengthening;Weights    Elevation Weight (lbs)  2 lb medicine ball    Protraction  Strengthening;Right;20 reps;Weights    Protraction Weight (lbs)  2 lb medicine ball    Horizontal ABduction  Strengthening;Both;10 reps    External Rotation  AROM;Right;20 reps    Flexion  AROM;Right;20 reps      Shoulder Exercises: Standing   Flexion  AROM;Right;20 reps;Weights;Other (comment)   with coffee mug   Flexion Limitations  mug to first shelf      Shoulder Exercises: ROM/Strengthening   Wall Wash  flexion x5   terminated secondary to fatigue     Moist Heat Therapy   Number Minutes Moist Heat  10 Minutes    Moist Heat Location  Shoulder             PT Education - 04/06/18 2012    Education Details  AROM  supine flexion; AROM abduction, theraband exercises    Person(s) Educated  Patient    Methods   Explanation;Demonstration;Handout;Verbal cues;Tactile cues    Comprehension  Need further instruction;Verbalized understanding;Verbal cues required          PT Long Term Goals - 04/06/18 1915      PT LONG TERM GOAL #1   Title  Patient will be independent with HEP.    Baseline       Time  3    Period  Weeks    Status  New      PT LONG TERM GOAL #2   Title  Patient will demonstrate 120 degress or greater of right shoulder flexion AROM to improve ability to perform overhead activities.    Time  3    Period  Weeks    Status  New      PT LONG TERM GOAL #3   Title  Patient will demonstrate 55 degress or greater of right shoulder ER AROM to improve ability to don/doff apparel.    Time  3    Period  Weeks    Status  New      PT LONG TERM GOAL #4   Title  Patient will demonstrate 3+/5 or greater right shoulder strength to improve ability to perform functional tasks.    Time  3    Period  Weeks    Status  New      PT LONG TERM GOAL #5   Title                Plan - 04/06/18 1913    Clinical Impression Statement  Patient is a right handed 77 year old female who presents to physical therapy with decreased right shoulder ROM and decreased shoulder MMT in all planes secondary to a reverse total shoulder replacement on 08/22/17. Patient and PT discussed importance of home exercise program to improve range of motion and improve strength. Patient reported understanding but did express she does not have anyone to help her perform her exercises. Patient provided with printed copy of HEP and instructed to keep on refrigerator to remind her to perform exercises. Patient would benefit from skilled physical therapy to address deficits and address patient's goals.     Clinical Presentation  Stable    Clinical Decision Making  Low    Rehab Potential  Fair    PT Frequency  2x / week    PT Duration  3 weeks    PT Treatment/Interventions  ADLs/Self Care Home Management;Cryotherapy;Electrical  Stimulation;Ultrasound;Moist Heat;Functional mobility training;Therapeutic activities;Therapeutic exercise;Neuromuscular re-education;Patient/family education;Passive range of motion;Vasopneumatic Device;Manual techniques    PT Next Visit Plan  Pulleys, strengthening of shoulder, functional exercises to improve performance of ADLs, modalities PRN for pain relief.    Consulted and Agree with Plan of Care  Patient       Patient will benefit from skilled therapeutic intervention in order to improve the following deficits and impairments:  Pain, Decreased strength, Impaired UE functional use, Decreased range of motion, Postural dysfunction  Visit Diagnosis: Muscle weakness (generalized) - Plan: PT plan of care cert/re-cert  Stiffness of right shoulder, not elsewhere classified - Plan: PT plan of care cert/re-cert  Chronic right shoulder pain - Plan: PT plan of care cert/re-cert     Problem List Patient Active Problem List   Diagnosis Date Noted  . Dementia with behavioral disturbance ([place]) 02/10/2018  . S/P shoulder replacement, right 08/22/2017  . HTN (hypertension) 02/19/2016  . Post-nasal drip 05/15/2015  .  Postoperative abdominal pain 12/07/2014  . Voiding dysfunction 10/07/2014  . SUI (stress urinary incontinence, female) 10/07/2014  . Osteoporosis with fracture 04/06/2014  . Impacted cerumen of both ears 08/20/2013  . Encounter for therapeutic drug monitoring 08/20/2013  . DOE (dyspnea on exertion) 07/23/2013  . Helicobacter positive gastritis 06/21/2013  . Orthostatic hypotension 06/10/2013  . UTI (lower urinary tract infection) 05/06/2013  . Neck mass 02/19/2013  . Neck pain, bilateral 02/19/2013  . Right groin mass 11/08/2011  . Low back pain 11/08/2011  . Lipoma of neck 06/21/2011  . Memory loss 06/21/2011  . OVERACTIVE BLADDER 08/21/2010  . Long term current use of anticoagulant 07/25/2010  . Osteoarthritis 01/18/2010  . KNEE PAIN 01/18/2010  . FOOT PAIN 01/18/2010   . Hyperlipidemia with target LDL less than 100 04/20/2009  . GERD 04/17/2009  . MITRAL REGURGITATION, MILD 08/30/2008  . NICM (nonischemic cardiomyopathy) ([place]) 08/30/2008  . Congestive heart failure ([place]) 04/13/2007  . DEPRESSION 04/10/2007  . DISEASE, MITRAL VALVE NEC/NOS 04/10/2007  . Atrial fibrillation ([place]) 04/10/2007  . STASIS ULCER 04/10/2007  . VENOUS INSUFFICIENCY, LEGS 04/10/2007   Gabriela Eves, PT, DPT 04/06/2018, 8:14 PM  [facility] [place], [place], [address] Phone: [phone]   Fax:  [phone]  Name: Virginia Young MRN: [id number] Date of Birth: Jul 23, 1940

## 2018-04-07 ENCOUNTER — Encounter: Payer: Self-pay | Admitting: *Deleted

## 2018-04-07 ENCOUNTER — Ambulatory Visit: Payer: Medicare Other | Admitting: *Deleted

## 2018-04-07 DIAGNOSIS — M6281 Muscle weakness (generalized): Secondary | ICD-10-CM

## 2018-04-07 DIAGNOSIS — M545 Low back pain: Secondary | ICD-10-CM

## 2018-04-07 DIAGNOSIS — M25611 Stiffness of right shoulder, not elsewhere classified: Secondary | ICD-10-CM | POA: Diagnosis not present

## 2018-04-07 DIAGNOSIS — M25511 Pain in right shoulder: Secondary | ICD-10-CM

## 2018-04-07 DIAGNOSIS — R293 Abnormal posture: Secondary | ICD-10-CM | POA: Diagnosis not present

## 2018-04-07 DIAGNOSIS — G8929 Other chronic pain: Secondary | ICD-10-CM

## 2018-04-07 NOTE — Therapy (Addendum)
[facility], [place], [address] Phone: [phone]   Fax:  [phone]  Physical Therapy Treatment PHYSICAL THERAPY DISCHARGE SUMMARY  Visits from Start of Care: 2  Current functional level related to goals / functional outcomes: See below   Remaining deficits: See goals   Education / Equipment: HEP  Plan: Patient agrees to discharge.  Patient goals were not met. Patient is being discharged due to not returning since the last visit.  ?????    Gabriela Eves, PT, DPT 09/22/18   Patient Details  Name: Virginia Young MRN: [id number] Date of Birth: [birth year] Referring Provider (PT): Assunta Found, MD   Encounter Date: 04/07/2018  PT End of Session - 04/07/18 1000    Visit Number  2    Number of Visits  6    Date for PT Re-Evaluation  05/04/18    Authorization Type  KX MODIFIER FOR ALL VISITS    PT Start Time  0945    PT Stop Time  1037    PT Time Calculation (min)  52 min       Past Medical History:  Diagnosis Date  . Allergy   . Atrial fibrillation (HCC)    a. coumadin;  b. Amiodarone  . Cataract   . Chest pain 03/2012   a. Lex MV 10/13:  EF 64%, dist ant and apical defect sugg of soft tissue atten, no ischemia  . Chronic systolic heart failure ([place])   . Depression   . Dysrhythmia   . GERD (gastroesophageal reflux disease)   . HLD (hyperlipidemia)   . Incontinence of urine   . Mild mitral regurgitation   . MS (multiple sclerosis) ([place])   . NICM (nonischemic cardiomyopathy) ([place])    a. neg CLite in 2003;  b. EF 40-45% in past;   c.  Echo 12/11: EF 35-40%, mild MR, mild LAE, mild RAE, small pericardial effusion   . Osteoarthritis   . Osteoporosis   . Stasis ulcer ([place])   . Uterine prolapse   . Vaginal prolapse without uterine prolapse   . Varicose veins     Past Surgical History:  Procedure Laterality Date  . ANTERIOR AND POSTERIOR VAGINAL REPAIR W/ SACROSPINOUS LIGAMENT  SUSPENSION  2016   [facility]  . BLADDER SURGERY    . CARPAL TUNNEL RELEASE Right 08/22/2017   Procedure: CARPAL TUNNEL RELEASE;  Surgeon: [name], MD;  Location: [facility];  Service: Orthopedics;  Laterality: Right;  . cataract surgery Bilateral   . EYE SURGERY    . INGUINAL HERNIA REPAIR     right  . LIPOMA EXCISION     h/o removed from upper back x 2  . REVERSE SHOULDER ARTHROPLASTY Right 08/22/2017   Procedure: REVERSE SHOULDER ARTHROPLASTY;  Surgeon: [name], MD;  Location: [facility];  Service: Orthopedics;  Laterality: Right;    There were no vitals filed for this visit.  Subjective Assessment - 04/07/18 0952    Subjective  Doing ok. Old age is no fun. RT shldr weak.    Pertinent History  right reverse total shoulder 08/22/17; MS; OP.    Limitations  Lifting;House hold activities    How long can you stand comfortably?  <10 minutes.    How long can you walk comfortably?  Short distances.    Diagnostic tests  NCV test to right UE.  X-rays    Patient Stated Goals  Move shoulder better.    Pain Score  2  Pain Location  Shoulder    Pain Orientation  Right    Pain Descriptors / Indicators  Sore    Pain Onset  More than a month ago                       [facility] Adult PT Treatment/Exercise - 04/07/18 0001      Exercises   Exercises  Shoulder;Elbow      Elbow Exercises   Elbow Flexion  Strengthening;Right;Seated;20 reps;10 reps;Bar weights/barbell    Bar Weights/Barbell (Elbow Flexion)  2 lbs;1 lb   13 x 20 , 2# x20     Shoulder Exercises: Seated   Elevation  Strengthening;Weights    Elevation Weight (lbs)  2 lb medicine ball 4 x10    Row  Strengthening;Right;Theraband;Limitations;Other (comment)   3 x 20   Theraband Level (Shoulder Row)  Level 2 (Red)    External Rotation  AROM;Right;20 reps    Internal Rotation  Right;Strengthening;Theraband   3x10     Shoulder Exercises: Pulleys   Flexion  5 minutes    Other Pulley Exercises  sitting UE ranger x 5 mins       Shoulder Exercises: ROM/Strengthening   Nustep  L3 x 5 mins seat 10.      Manual Therapy   Manual Therapy  Passive ROM    Passive ROM  AAROM for flexion in sitting 4x10, resisted ER and IR                  PT Long Term Goals - 04/06/18 1915      PT LONG TERM GOAL #1   Title  Patient will be independent with HEP.    Baseline       Time  3    Period  Weeks    Status  New      PT LONG TERM GOAL #2   Title  Patient will demonstrate 120 degress or greater of right shoulder flexion AROM to improve ability to perform overhead activities.    Time  3    Period  Weeks    Status  New      PT LONG TERM GOAL #3   Title  Patient will demonstrate 55 degress or greater of right shoulder ER AROM to improve ability to don/doff apparel.    Time  3    Period  Weeks    Status  New      PT LONG TERM GOAL #4   Title  Patient will demonstrate 3+/5 or greater right shoulder strength to improve ability to perform functional tasks.    Time  3    Period  Weeks    Status  New      PT LONG TERM GOAL #5   Title               Plan - 04/07/18 1320    Clinical Impression Statement  Pt arrived today doing fairly well with  RT shldr. She was able to complete AAROM,AROM, and light strengthening with mainly fatigue.Pt requires verbal and tactile cues with exs for technique.     Clinical Presentation  Stable    Clinical Decision Making  Low    Rehab Potential  Fair    PT Frequency  2x / week    PT Duration  3 weeks    PT Treatment/Interventions  ADLs/Self Care Home Management;Cryotherapy;Electrical Stimulation;Ultrasound;Moist Heat;Functional mobility training;Therapeutic activities;Therapeutic exercise;Neuromuscular re-education;Patient/family education;Passive range of motion;Vasopneumatic Device;Manual techniques    PT  Next Visit Plan  Pulleys, strengthening of shoulder, functional exercises to improve performance of ADLs, modalities PRN for pain relief.    Consulted and Agree  with Plan of Care  Patient       Patient will benefit from skilled therapeutic intervention in order to improve the following deficits and impairments:  Pain, Decreased strength, Impaired UE functional use, Decreased range of motion, Postural dysfunction  Visit Diagnosis: Muscle weakness (generalized)  Stiffness of right shoulder, not elsewhere classified  Chronic right shoulder pain  Chronic right-sided low back pain without sciatica  Abnormal posture     Problem List Patient Active Problem List   Diagnosis Date Noted  . Dementia with behavioral disturbance ([place]) 02/10/2018  . S/P shoulder replacement, right 08/22/2017  . HTN (hypertension) 02/19/2016  . Post-nasal drip 05/15/2015  . Postoperative abdominal pain 12/07/2014  . Voiding dysfunction 10/07/2014  . SUI (stress urinary incontinence, female) 10/07/2014  . Osteoporosis with fracture 04/06/2014  . Impacted cerumen of both ears 08/20/2013  . Encounter for therapeutic drug monitoring 08/20/2013  . DOE (dyspnea on exertion) 07/23/2013  . Helicobacter positive gastritis 06/21/2013  . Orthostatic hypotension 06/10/2013  . UTI (lower urinary tract infection) 05/06/2013  . Neck mass 02/19/2013  . Neck pain, bilateral 02/19/2013  . Right groin mass 11/08/2011  . Low back pain 11/08/2011  . Lipoma of neck 06/21/2011  . Memory loss 06/21/2011  . OVERACTIVE BLADDER 08/21/2010  . Long term current use of anticoagulant 07/25/2010  . Osteoarthritis 01/18/2010  . KNEE PAIN 01/18/2010  . FOOT PAIN 01/18/2010  . Hyperlipidemia with target LDL less than 100 04/20/2009  . GERD 04/17/2009  . MITRAL REGURGITATION, MILD 08/30/2008  . NICM (nonischemic cardiomyopathy) ([place]) 08/30/2008  . Congestive heart failure ([place]) 04/13/2007  . DEPRESSION 04/10/2007  . DISEASE, MITRAL VALVE NEC/NOS 04/10/2007  . Atrial fibrillation ([place]) 04/10/2007  . STASIS ULCER 04/10/2007  . VENOUS INSUFFICIENCY, LEGS 04/10/2007    Taiwana Willison,CHRIS,  PTA 04/07/2018, 1:35 PM  [facility] [place], [place], [address] Phone: [phone]   Fax:  [phone]  Name: Virginia Young MRN: [id number] Date of Birth: 1941/03/13

## 2018-04-08 DIAGNOSIS — Z471 Aftercare following joint replacement surgery: Secondary | ICD-10-CM | POA: Diagnosis not present

## 2018-04-08 DIAGNOSIS — Z96611 Presence of right artificial shoulder joint: Secondary | ICD-10-CM | POA: Diagnosis not present

## 2018-04-08 DIAGNOSIS — M545 Low back pain: Secondary | ICD-10-CM | POA: Diagnosis not present

## 2018-04-27 DIAGNOSIS — M545 Low back pain: Secondary | ICD-10-CM | POA: Diagnosis not present

## 2018-05-17 ENCOUNTER — Other Ambulatory Visit: Payer: Self-pay | Admitting: Pediatrics

## 2018-05-17 DIAGNOSIS — E559 Vitamin D deficiency, unspecified: Secondary | ICD-10-CM

## 2018-06-04 NOTE — Telephone Encounter (Signed)
We can address at next appointment

## 2018-06-04 NOTE — Telephone Encounter (Signed)
noted 

## 2018-06-10 ENCOUNTER — Ambulatory Visit (INDEPENDENT_AMBULATORY_CARE_PROVIDER_SITE_OTHER): Payer: Medicare Other | Admitting: Pediatrics

## 2018-06-10 ENCOUNTER — Encounter: Payer: Self-pay | Admitting: Pediatrics

## 2018-06-10 VITALS — BP 105/72 | HR 83 | Temp 97.8°F | Ht 71.0 in | Wt 179.4 lb

## 2018-06-10 DIAGNOSIS — K5900 Constipation, unspecified: Secondary | ICD-10-CM | POA: Diagnosis not present

## 2018-06-10 DIAGNOSIS — R350 Frequency of micturition: Secondary | ICD-10-CM

## 2018-06-10 DIAGNOSIS — G459 Transient cerebral ischemic attack, unspecified: Secondary | ICD-10-CM

## 2018-06-10 DIAGNOSIS — N3000 Acute cystitis without hematuria: Secondary | ICD-10-CM

## 2018-06-10 DIAGNOSIS — I480 Paroxysmal atrial fibrillation: Secondary | ICD-10-CM | POA: Diagnosis not present

## 2018-06-10 LAB — MICROSCOPIC EXAMINATION
Renal Epithel, UA: NONE SEEN /hpf
WBC, UA: >30 /hpf — AB (ref 0–5)

## 2018-06-10 LAB — URINALYSIS, COMPLETE
Bilirubin, UA: NEGATIVE
Glucose, UA: NEGATIVE
Nitrite, UA: POSITIVE — AB
Specific Gravity, UA: 1.02 (ref 1.005–1.030)
Urobilinogen, Ur: 1 mg/dL (ref 0.2–1.0)
pH, UA: 6 (ref 5.0–7.5)

## 2018-06-10 LAB — BMP8+EGFR
BUN/Creatinine Ratio: 20 (ref 12–28)
BUN: 25 mg/dL (ref 8–27)
CO2: 24 mmol/L (ref 20–29)
Calcium: 9.3 mg/dL (ref 8.7–10.3)
Chloride: 103 mmol/L (ref 96–106)
Creatinine, Ser: 1.25 mg/dL — ABNORMAL HIGH (ref 0.57–1.00)
GFR calc Af Amer: 48 mL/min/{1.73_m2} — ABNORMAL LOW (ref >59)
GFR calc non Af Amer: 41 mL/min/{1.73_m2} — ABNORMAL LOW (ref >59)
Glucose: 124 mg/dL — ABNORMAL HIGH (ref 65–99)
Potassium: 4.3 mmol/L (ref 3.5–5.2)
Sodium: 142 mmol/L (ref 134–144)

## 2018-06-10 MED ORDER — ELIQUIS 5 MG PO TABS: 5.0000 mg | ORAL_TABLET | Freq: Two times a day (BID) | ORAL | 1 refills | Status: DC

## 2018-06-10 MED ORDER — CEFUROXIME AXETIL 250 MG PO TABS: 250.0000 mg | ORAL_TABLET | Freq: Two times a day (BID) | ORAL | 0 refills | Status: DC

## 2018-06-10 NOTE — Progress Notes (Signed)
Subjective:   Patient ID: Virginia Young, female    DOB: Jul 09, 1940, 77 y.o.   MRN: [id number] CC: Medical Management of Chronic Issues and Urine odor  HPI: Virginia Young is a 77 y.o. female   Has had several episodes over the last few weeks where she has a difficult time talking that lasts for minutes, less than an hour.  She says she realizes that she is not able to say words correctly.  Her nurse aide who is with her will noticed the same thing.  She almost brought her to the emergency room last week during an episode that lasted almost an hour this was longer than most episodes.  It then resolved spontaneously and she was back to her normal self.  She does remember what happens with these episodes usually she and her nurse aide think.  Dementia with hallucinations: She is still sometimes having hallucinations.  She thinks the Seroquel has been helping some.  She has a Marine scientist aide with her 8 hours a day during the day.  She has an appointment in 4 weeks to follow-up with neurology.  She has had an odor to her urine and urinary frequency for the last week.  Atrial fibrillation: Taking Eliquis regularly.  She has a system where she writes down whether she is taking the medicine or not so she remembers to take it daily.  She rarely misses a pill.  Constipation: Still regularly having to push and strain to pass stool.  Not having a stool every day.  Not taking Linzess anymore.  She does not remember why not.  Relevant past medical, surgical, family and social history reviewed. Allergies and medications reviewed and updated. Social History   Tobacco Use  Smoking Status Never Smoker  Smokeless Tobacco Never Used   ROS: Per HPI   Objective:    BP 105/72   Pulse 83   Temp 97.8 F (36.6 C) (Oral)   Ht 5' 11"  (1.803 m)   Wt 179 lb 6.4 oz (81.4 kg)   BMI 25.02 kg/m   Wt Readings from Last 3 Encounters:  06/10/18 179 lb 6.4 oz (81.4 kg)  03/11/18 174 lb 12.8 oz (79.3 kg)    02/10/18 172 lb (78 kg)    Gen: NAD, alert, cooperative with exam, NCAT EYES: EOMI, no conjunctival injection, or no icterus ENT:   OP without erythema LYMPH: no cervical LAD CV: NRRR, normal S1/S2, no murmur, distal pulses 2+ b/l Resp: CTABL, no wheezes, normal WOB Ext: No edema, warm Neuro: Alert and oriented, strength equal b/l UE and LE, sensation intact bilateral extremities MSK: normal muscle bulk  Assessment & Plan:  Shirly was seen today for medical management of chronic issues and urine odor.  Diagnoses and all orders for this visit:  Constipation, unspecified constipation type Restart Linzess.  May have loose stools further a week or so before hopefully getting the more regular.  She has lots of the 133mg capsules at home.  Offered lower dose, patient wants to try same dose for now, will let me know if symptoms do not improve  Paroxysmal atrial fibrillation (HCC) Stable, continue below -     ELIQUIS 5 MG TABS tablet; Take 1 tablet (5 mg total) by mouth every 12 (twelve) hours.  TIA (transient ischemic attack) Concern episodes with inability to speak could represent TIAs.  Will check below.  Continue Eliquis.  Blood pressures well controlled. -     UKoreaCarotid Duplex Bilateral; Future  Urinary frequency  UTI Positive UA.  Will start cefuroxime based on most recent culture data.  Follow-up culture. -     BMP8+EGFR -     Urinalysis, Complete -     Urine Culture  Other orders -     Microscopic Examination   Follow up plan: Return in about 3 months (around 09/09/2018). Assunta Found, MD [place]

## 2018-06-15 LAB — URINE CULTURE

## 2018-06-19 ENCOUNTER — Ambulatory Visit (HOSPITAL_COMMUNITY): Admission: RE | Admit: 2018-06-19 | Payer: Medicare Other | Source: Ambulatory Visit

## 2018-06-22 ENCOUNTER — Telehealth: Payer: Self-pay | Admitting: Pediatrics

## 2018-06-22 DIAGNOSIS — N3000 Acute cystitis without hematuria: Secondary | ICD-10-CM

## 2018-06-22 MED ORDER — CEFUROXIME AXETIL 250 MG PO TABS: 250.0000 mg | ORAL_TABLET | Freq: Two times a day (BID) | ORAL | 0 refills | Status: DC

## 2018-06-22 NOTE — Telephone Encounter (Signed)
What symptoms do you have? Smelly urine  How long have you been sick? Since she was last seen here  Have you been seen for this problem? Yes for UTI and was prescribed antibiotic and has edema over her lower leg as well  If your provider decides to give you a prescription, which pharmacy would you like for it to be sent to? [facility]   Patient informed that this information will be sent to the clinical staff for review and that they should receive a follow up call.

## 2018-06-30 NOTE — Telephone Encounter (Signed)
Multiple attempts made to contact patient.  This encounter will now be closed  

## 2018-07-07 ENCOUNTER — Ambulatory Visit (INDEPENDENT_AMBULATORY_CARE_PROVIDER_SITE_OTHER): Payer: Medicare Other | Admitting: Neurology

## 2018-07-07 ENCOUNTER — Encounter: Payer: Self-pay | Admitting: Neurology

## 2018-07-07 VITALS — BP 153/104 | HR 107 | Ht 70.0 in | Wt 176.0 lb

## 2018-07-07 DIAGNOSIS — F0391 Unspecified dementia with behavioral disturbance: Secondary | ICD-10-CM | POA: Diagnosis not present

## 2018-07-07 MED ORDER — DIVALPROEX SODIUM ER 250 MG PO TB24: 250.0000 mg | ORAL_TABLET | Freq: Every day | ORAL | 6 refills | Status: DC

## 2018-07-07 NOTE — Patient Instructions (Addendum)
Start Depakote which helps with seizures and behavioral problems like irritability May to increase in the future email me or call if the episodes continue Needs 24x7 care No driving Continue current medication  Valproic Acid, Divalproex Sodium delayed or extended-release tablets What is this medicine? DIVALPROEX SODIUM (dye VAL pro ex SO dee um) is used to prevent seizures caused by some forms of epilepsy. It is also used to treat bipolar mania and to prevent migraine headaches. This medicine may be used for other purposes; ask your health care provider or pharmacist if you have questions. COMMON BRAND NAME(S): Depakote, Depakote ER What should I tell my health care provider before I take this medicine? They need to know if you have any of these conditions: -if you often drink alcohol -kidney disease -liver disease -low platelet counts -mitochondrial disease -suicidal thoughts, plans, or attempt; a previous suicide attempt by you or a family member -urea cycle disorder (UCD) -an unusual or allergic reaction to divalproex sodium, sodium valproate, valproic acid, other medicines, foods, dyes, or preservatives -pregnant or trying to get pregnant -breast-feeding How should I use this medicine? Take this medicine by mouth with a drink of water. Follow the directions on the prescription label. Do not cut, crush or chew this medicine. You can take it with or without food. If it upsets your stomach, take it with food. Take your medicine at regular intervals. Do not take it more often than directed. Do not stop taking except on your doctor's advice. A special MedGuide will be given to you by the pharmacist with each prescription and refill. Be sure to read this information carefully each time. Talk to your pediatrician regarding the use of this medicine in children. While this drug may be prescribed for children as young as 10 years for selected conditions, precautions do apply. Overdosage: If you  think you have taken too much of this medicine contact a poison control center or emergency room at once. NOTE: This medicine is only for you. Do not share this medicine with others. What if I miss a dose? If you miss a dose, take it as soon as you can. If it is almost time for your next dose, take only that dose. Do not take double or extra doses. What may interact with this medicine? Do not take this medicine with any of the following medications: -sodium phenylbutyrate This medicine may also interact with the following medications: -aspirin -certain antibiotics like ertapenem, imipenem, meropenem -certain medicines for depression, anxiety, or psychotic disturbances -certain medicines for seizures like carbamazepine, clonazepam, diazepam, ethosuximide, felbamate, lamotrigine, phenobarbital, phenytoin, primidone, rufinamide, topiramate -certain medicines that treat or prevent blood clots like warfarin -cholestyramine -female hormones, like estrogens and birth control pills, patches, or rings -propofol -rifampin -ritonavir -tolbutamide -zidovudine This list may not describe all possible interactions. Give your health care provider a list of all the medicines, herbs, non-prescription drugs, or dietary supplements you use. Also tell them if you smoke, drink alcohol, or use illegal drugs. Some items may interact with your medicine. What should I watch for while using this medicine? Tell your doctor or health care professional if your symptoms do not get better or they start to get worse. Wear a medical ID bracelet or chain, and carry a card that describes your disease and details of your medicine and dosage times. You may get drowsy, dizzy, or have blurred vision. Do not drive, use machinery, or do anything that needs mental alertness until you know how this medicine affects  you. To reduce dizzy or fainting spells, do not sit or stand up quickly, especially if you are an older patient. Alcohol  can increase drowsiness and dizziness. Avoid alcoholic drinks. This medicine can make you more sensitive to the sun. Keep out of the sun. If you cannot avoid being in the sun, wear protective clothing and use sunscreen. Do not use sun lamps or tanning beds/booths. Patients and their families should watch out for new or worsening depression or thoughts of suicide. Also watch out for sudden changes in feelings such as feeling anxious, agitated, panicky, irritable, hostile, aggressive, impulsive, severely restless, overly excited and hyperactive, or not being able to sleep. If this happens, especially at the beginning of treatment or after a change in dose, call your health care professional. Women should inform their doctor if they wish to become pregnant or think they might be pregnant. There is a potential for serious side effects to an unborn child. Talk to your health care professional or pharmacist for more information. Women who become pregnant while using this medicine may enroll in the [place] Pregnancy Registry by calling [phone]. This registry collects information about the safety of antiepileptic drug use during pregnancy. This medicine may cause a decrease in folic acid and vitamin D. You should make sure that you get enough vitamins while you are taking this medicine. Discuss the foods you eat and the vitamins you take with your health care professional. What side effects may I notice from receiving this medicine? Side effects that you should report to your doctor or health care professional as soon as possible: -allergic reactions like skin rash, itching or hives, swelling of the face, lips, or tongue -changes in vision -redness, blistering, peeling or loosening of the skin, including inside the mouth -signs and symptoms of liver injury like dark yellow or brown urine; general ill feeling or flu-like symptoms; light-colored stools; loss of appetite; nausea;  right upper belly pain; unusually weak or tired; yellowing of the eyes or skin -suicidal thoughts or other mood changes -unusual bleeding or bruising Side effects that usually do not require medical attention (report to your doctor or health care professional if they continue or are bothersome): -constipation -diarrhea -dizziness -hair loss -headache -loss of appetite -weight gain This list may not describe all possible side effects. Call your doctor for medical advice about side effects. You may report side effects to FDA at 1-800-FDA-1088. Where should I keep my medicine? Keep out of reach of children. Store at room temperature between 15 and 30 degrees C (59 and 86 degrees F). Keep container tightly closed. Throw away any unused medicine after the expiration date. NOTE: This sheet is a summary. It may not cover all possible information. If you have questions about this medicine, talk to your doctor, pharmacist, or health care provider.  2019 Elsevier/Gold Standard (2017-03-03 10:48:27)    Recommendations to prevent or slow progression of cognitive decline:   Exercise You should increase exercise 30 to 45 minutes per day at least 3 days a week although 5 to 7 would be preferred. Any type of exercise (including walking) is acceptable although a recumbent bicycle may be best if you are unsteady. Disease related apathy can be a significant roadblock to exercise and the only way to overcome this is to make it a daily routine and perhaps have a reward at the end (something your loved one loves to eat or drink perhaps) or a personal trainer coming to the home can  also be very useful. In general a structured, repetitive schedule is best.   Cardiovascular Health: You should optimize all cardiovascular risk factors (blood pressure, sugar, cholesterol) as vascular disease such as strokes and heart attacks can make memory problems much worse.   Diet: Eating a heart healthy (Mediterranean) diet is  also a good idea; fish and poultry instead of red meat, nuts (mostly non-peanuts), vegetables, fruits, olive oil or canola oil (instead of butter), minimal salt (use other spices to flavor foods), whole grain rice, bread, cereal and pasta and wine in moderation.  General Health: Any diseases which effect your body will effect your brain such as a pneumonia, urinary infection, blood clot, heart attack or stroke. Keep contact with your primary care doctor for regular follow ups.  Sleep. A good nights sleep is healthy for the brain. Seven hours is recommended. If you have insomnia or poor sleep habits see the recommendations below  Tips: Structured and consistent daytime and nighttime routine, including regular wake times, bedtimes, and mealtimes, will be important for the patient to avoid confusion. Keeping frequently used items in designated places will help reduce stress from searching. If there are worries about getting lost do not let the patient leave home unaccompanied. They might benefit from wearing an identification bracelet that will help others assist in finding home if they become lost. Information about nationwide safe return services and other helpful resources may be obtained through the Alzheimer's Association helpline at [phone].  Finances, Power of Producer, television/film/video Directives: You should consider putting legal safeguards in place with regard to financial and medical decision making. While the spouse always has power of attorney for medical and financial issues in the absence of any form, you should consider what you want in case the spouse / caregiver is no longer around or capable of making decisions.   [place] : http://www.welch.com/.pdf  Or Google "Earl" AND "An Forensic scientist for [employer]  Other States:  ApartmentMom.com.ee  The signature on these forms should be notarized.   DRIVING:   Driving only during the day Drive only to familiar Locations Avoid driving during bad weather  If you would like to be tested to see if you are driving safely, [facility] has a Clinical Driving Evaluation. To schedule an appointment call [phone].                RESOURCES:  Memory Loss: Improve your short term memory By Silvio Pate  The Alzheimer's Reading Room http://[email or web address]   The Alzheimer's Compendium http://www.alzcompend.info/  [employer] www.dukefamilysupport.YJE [phone]  Recommended resources for caregivers (All can be purchased on [employer]):  1) A Caregiver's Guide to Dementia: Using Activities and Other Strategies to Prevent, Reduce and Manage Behavioral Symptoms by Osie Bond. Tyler Aas and [employer]   2) A Caregiver's Guide to [employer] Dementia by Caleen Essex MS BSN and Gaston Islam   3) What If It's Not Alzheimer's?: A Caregiver's Guide to Dementia by Koren Shiver (Author), Octaviano Batty (Editor)  3) The 36 hour day by Rabins and Mace  4) Understanding Difficult Behaviors by Merita Norton and White  Online course for helping caregivers reduce stress, guilt and frustration called the Caregivers Helpbook. The website is www.powerfultoolsforcaregivers.org  As a caregiver you are a Art gallery manager. Problems you face as a caregiver are usually unique to your situation and the way your loved-one's disease manifests itself. The best way to use these books is to look at the Table of Contents and  read any chapters of interest or that apply to challenges you are having as a caregiver.  NATIONAL RESOURCES: For more information on neurological disorders or research programs funded by the [employer] of Neurological Disorders and Stroke, contact the Institute's Financial planner (BRAIN) at: [place] [address] [phone] (toll-free) MasterBoxes.it  Information on dementia is also available from the following organizations: Alzheimer's Disease Education and Referral ([place]) [place] [facility] [address] [place] [address] [phone] (toll-free) DVDEnthusiasts.nl  Alzheimer's Association [address] [place] [address] [phone] (toll-free, 24-hour helpline) [phone] (TDD) CapitalMile.co.nz  Alzheimer's Foundation of [facility] [address], [place] [address] [phone] (toll-free) www.alzfdn.org  [facility] [address], [place] [address] [phone] www.alzdiscovery.org  [facility] [address], [place] [address] [phone] (toll-free) [place] [address] [phone] (toll-free) www.brightfocus.org/alzheimers  [facility] Stabl[facility] [address] [place] [address] [phone] www.https://lambert-jackson.net/  Lewy Body Dementia Association [address], [place] [address] [phone] (toll-free LBD Caregiver Link) www.lbda.[place], [place], [place] [address] [phone] (toll-free) [phone] [facility]) https://carter.com/  National Organization for Rare Disorders [address] [place] [address] 8-333-832-NVBT [phone]) (toll-free) www.rarediseases.org  The Dementias: Hope Through Research was jointly produced by the [employer] of Neurological Disorders and Stroke (NINDS) and the [employer] on Aging (NIA), both part of the [employer], the  [employer] research agency-supporting scientific studies that turn discovery into health. NINDS is the nation's leading funder of research on the brain and nervous system. The NINDS mission is to reduce the burden of neurological disease. For more information and resources, visit MasterBoxes.it [1] or call [phone]. NIA leads the federal government effort conducting and supporting research on aging and the health and well-being of older people. NIA's Alzheimer's Disease Education and Referral (ADEAR) Center offers information and publications on dementia and caregiving for families, caregivers, and professionals. For more information, visit DVDEnthusiasts.nl [2] or call [phone]. Also available from NIA are publications and information about Alzheimer's disease as well as the booklets Frontotemporal Disorders: Information for Patients, Families, and Caregivers and Lewy Body Dementia: Information for Patients, Families, and Professionals. Source URL: SocialSpecialists.co.nz
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
## 2018-07-22 ENCOUNTER — Ambulatory Visit: Payer: Medicare Other | Admitting: Cardiology

## 2018-07-24 ENCOUNTER — Ambulatory Visit: Payer: Medicare Other | Admitting: Pediatrics

## 2018-07-27 ENCOUNTER — Ambulatory Visit: Payer: Medicare Other | Admitting: Pediatrics

## 2018-07-28 ENCOUNTER — Encounter: Payer: Self-pay | Admitting: *Deleted

## 2018-08-02 ENCOUNTER — Other Ambulatory Visit: Payer: Self-pay | Admitting: Pediatrics

## 2018-08-02 DIAGNOSIS — I480 Paroxysmal atrial fibrillation: Secondary | ICD-10-CM

## 2018-08-04 NOTE — Progress Notes (Addendum)
HPI The patient presents for evaluation of atrial fibrillation and nonischemic cardiomyopathy.  She has a mildly reduced ejection fraction of 40% in 04/2016.   Since I last saw her she had shoulder surgery.    She came into the office today for routine follow-up.  She is actually quite short of breath walking into the office.  Walking her around her oxygen saturation fell to 83% on room air.  She also had some orthostatic symptoms that she was describing and she was found to be orthostatic on blood pressure check.  She is not in any distress and at rest she is breathing well.  She is had a little swelling in her feet she says but she is not describing PND or orthopnea.  She does not describe the dizziness upon standing but she has not had any presyncope or syncope.  She lives alone but has an aide who is with her 5 days a week for 8 hours.  She denies any chest pressure, neck or arm discomfort.  She is not had any cough fevers or chills recently  Allergies  Allergen Reactions  . Bactrim [Sulfamethoxazole-Trimethoprim]     Mouth sores  . Lipitor [Atorvastatin Calcium]     myalgia  . Lisinopril     cough    Current Outpatient Medications  Medication Sig Dispense Refill  . CVS D3 25 MCG (1000 UT) capsule TAKE 1 CAPSULE (1,000 UNITS TOTAL) BY MOUTH DAILY. 90 capsule 1  . divalproex (DEPAKOTE ER) 250 MG 24 hr tablet Take 1 tablet (250 mg total) by mouth daily. 30 tablet 6  . ELIQUIS 5 MG TABS tablet TAKE 1 TABLET BY MOUTH EVERY 12 HOURS 60 tablet 2  . LINZESS 145 MCG CAPS capsule TAKE 1 CAPSULE (145 MCG TOTAL) BY MOUTH DAILY BEFORE BREAKFAST. 90 capsule 0  . Multiple Vitamin (MULTIVITAMIN) tablet Take 1 tablet by mouth daily.    . QUEtiapine (SEROQUEL) 50 MG tablet TAKE 1 TABLET BY MOUTH TWICE A DAY 180 tablet 4  . furosemide (LASIX) 20 MG tablet Take 1 tablet (20 mg total) by mouth daily. 30 tablet 6   No current facility-administered medications for this visit.     Past Medical  History:  Diagnosis Date  . Allergy   . Atrial fibrillation (HCC)    coumadin  . Cataract   . Chest pain 03/2012   a. Lex MV 10/13:  EF 64%, dist ant and apical defect sugg of soft tissue atten, no ischemia  . Chronic systolic heart failure ([place])   . Depression   . GERD (gastroesophageal reflux disease)   . HLD (hyperlipidemia)   . Mild mitral regurgitation   . MS (multiple sclerosis) ([place])   . NICM (nonischemic cardiomyopathy) ([place])    a. neg CLite in 2003;  b. EF 40-45% in past;   c.  Echo 12/11: EF 35-40%, mild MR, mild LAE, mild RAE, small pericardial effusion   . Osteoarthritis   . Osteoporosis   . Stasis ulcer ([place])   . Vaginal prolapse without uterine prolapse   . Varicose veins     Past Surgical History:  Procedure Laterality Date  . ANTERIOR AND POSTERIOR VAGINAL REPAIR W/ SACROSPINOUS LIGAMENT SUSPENSION  2016   [facility]  . BLADDER SURGERY    . CARPAL TUNNEL RELEASE Right 08/22/2017   Procedure: CARPAL TUNNEL RELEASE;  Surgeon: [name], MD;  Location: [facility];  Service: Orthopedics;  Laterality: Right;  . cataract surgery Bilateral   . EYE  SURGERY    . INGUINAL HERNIA REPAIR     right  . LIPOMA EXCISION     h/o removed from upper back x 2  . REVERSE SHOULDER ARTHROPLASTY Right 08/22/2017   Procedure: REVERSE SHOULDER ARTHROPLASTY;  Surgeon: [name], MD;  Location: [facility];  Service: Orthopedics;  Laterality: Right;    ROS:    As stated in the HPI and negative for all other systems.   PHYSICAL EXAM Ht 5\' 10"  (1.778 m)   Wt 185 lb 3.2 oz (84 kg)   SpO2 (!) 83% Comment: after walking  BMI 26.57 kg/m  HR 64, BP 102/58 without orthostatic BP drop.  GENERAL:  Well appearing NECK:  No jugular venous distention, waveform within normal limits, carotid upstroke brisk and symmetric, no bruits, no thyromegaly LUNGS:  Clear to auscultation bilaterally CHEST:  Unremarkable HEART:  PMI not displaced or sustained,S1 and S2 within normal limits, no S3, no clicks, no  rubs, no murmurs, irregular  ABD:  Flat, positive bowel sounds normal in frequency in pitch, no bruits, no rebound, no guarding, no midline pulsatile mass, no hepatomegaly, no splenomegaly EXT:  2 plus pulses throughout, no edema, no cyanosis no clubbing   EKG:   Atrial fibrillation, rate 101, leftward axis, premature ectopic complexes, no acute ST-T wave changes.  Lab Results  Component Value Date   TSH 1.420 02/02/2018   ALT 17 02/02/2018   AST 20 02/02/2018   ALKPHOS 143 (H) 02/02/2018   BILITOT 0.5 02/02/2018   PROT 6.6 02/02/2018   ALBUMIN 4.0 02/02/2018    ASSESSMENT AND PLAN  DYSPNEA:    I have started with a chest x-ray and a BNP level.  She will also need a repeat echocardiogram.  It is likely that she will need some oxygen at home but I like to first start with this evaluated.  Today she is in no distress and so I will make no change in her therapy until I have these data.   CARDIOMYOPATHY: This is mildly reduced.  I am going to check a BNP level and an echocardiogram as above.  ATRIAL FIB:  [name] has a CHA2DS2 - VASc score of 3 with a risk of stroke of 3.2%.  She seems to tolerate this rhythm with rate control and anticoagulation.  Her rate was controlled on her previous Holter.

## 2018-08-05 ENCOUNTER — Ambulatory Visit
Admission: RE | Admit: 2018-08-05 | Discharge: 2018-08-05 | Disposition: A | Discharge status: Home / Self Care | Payer: Medicare Other | Source: Ambulatory Visit | Attending: Cardiology | Admitting: Cardiology

## 2018-08-05 ENCOUNTER — Encounter: Payer: Self-pay | Admitting: Cardiology

## 2018-08-05 ENCOUNTER — Ambulatory Visit (INDEPENDENT_AMBULATORY_CARE_PROVIDER_SITE_OTHER): Payer: Medicare Other | Admitting: Cardiology

## 2018-08-05 VITALS — Ht 70.0 in | Wt 185.2 lb

## 2018-08-05 DIAGNOSIS — I420 Dilated cardiomyopathy: Secondary | ICD-10-CM | POA: Diagnosis not present

## 2018-08-05 DIAGNOSIS — Z79899 Other long term (current) drug therapy: Secondary | ICD-10-CM | POA: Diagnosis not present

## 2018-08-05 DIAGNOSIS — I482 Chronic atrial fibrillation, unspecified: Secondary | ICD-10-CM | POA: Diagnosis not present

## 2018-08-05 DIAGNOSIS — R0602 Shortness of breath: Secondary | ICD-10-CM

## 2018-08-05 NOTE — Patient Instructions (Signed)
Medication Instructions:  Continue current medications  If you need a refill on your cardiac medications before your next appointment, please call your pharmacy.  Labwork: BNP and CBC HERE IN OUR OFFICE AT [facility]  You will need to fast. DO NOT EAT OR DRINK PAST MIDNIGHT.     You will NOT need to fast   Take the provided lab slips with you to the lab for your blood draw.   When you have your labs (blood work) drawn today and your tests are completely normal, you will receive your results only by MyChart Message (if you have MyChart) -OR-  A paper copy in the mail.  If you have any lab test that is abnormal or we need to change your treatment, we will call you to review these results.  Testing/Procedures: Your physician has requested that you have an echocardiogram. Echocardiography is a painless test that uses sound waves to create images of your heart. It provides your doctor with information about the size and shape of your heart and how well your heart's chambers and valves are working. This procedure takes approximately one hour. There are no restrictions for this procedure.  A chest x-ray takes a picture of the organs and structures inside the chest, including the heart, lungs, and blood vessels. This test can show several things, including, whether the heart is enlarges; whether fluid is building up in the lungs; and whether pacemaker / defibrillator leads are still in place.   Follow-Up: Your physician recommends that you schedule a follow-up appointment in: After Test  At [facility], you and your health needs are our priority.  As part of our continuing mission to provide you with exceptional heart care, we have created designated Provider Care Teams.  These Care Teams include your primary Cardiologist (physician) and Advanced Practice Providers (APPs -  Physician Assistants and Nurse Practitioners) who all work together to provide you with the care you need, when you need  it.   Thank you for choosing [facility] at [facility]!!

## 2018-08-06 LAB — CBC
Hematocrit: 42.5 % (ref 34.0–46.6)
Hemoglobin: 14.1 g/dL (ref 11.1–15.9)
MCH: 28.4 pg (ref 26.6–33.0)
MCHC: 33.2 g/dL (ref 31.5–35.7)
MCV: 86 fL (ref 79–97)
Platelets: 237 10*3/uL (ref 150–450)
RBC: 4.96 x10E6/uL (ref 3.77–5.28)
RDW: 13.3 % (ref 11.7–15.4)
WBC: 10.7 10*3/uL (ref 3.4–10.8)

## 2018-08-06 LAB — BRAIN NATRIURETIC PEPTIDE: BNP: 152.9 pg/mL — ABNORMAL HIGH (ref 0.0–100.0)

## 2018-08-11 ENCOUNTER — Telehealth: Payer: Self-pay | Admitting: *Deleted

## 2018-08-11 DIAGNOSIS — R0602 Shortness of breath: Secondary | ICD-10-CM

## 2018-08-11 DIAGNOSIS — Z79899 Other long term (current) drug therapy: Secondary | ICD-10-CM

## 2018-08-11 MED ORDER — FUROSEMIDE 20 MG PO TABS: 20.0000 mg | ORAL_TABLET | Freq: Every day | ORAL | 6 refills | Status: DC

## 2018-08-11 NOTE — Telephone Encounter (Signed)
-----   Message from Minus Breeding, MD sent at 08/09/2018  9:13 PM EST ----- I would like to have PFTs and start her on Lasix 20 mg po daily and check her BMET in 2 weeks.  Call Ms. Hast with the results

## 2018-08-11 NOTE — Telephone Encounter (Signed)
Spoke with pt about her blood work and chest xray PFT ordered, BMP ordered and mailed to pt, Lasix 20 mg # 30 6 refills send into pt pharmacy.

## 2018-08-13 ENCOUNTER — Ambulatory Visit (HOSPITAL_COMMUNITY): Payer: Medicare Other | Attending: Cardiovascular Disease

## 2018-08-13 DIAGNOSIS — R0602 Shortness of breath: Secondary | ICD-10-CM | POA: Diagnosis not present

## 2018-08-24 ENCOUNTER — Telehealth: Payer: Self-pay | Admitting: Cardiology

## 2018-08-24 NOTE — Telephone Encounter (Signed)
SPOKE WITH CAREGIVER -  APPOINTMENT SCHEDULE FOR 09/01/18 WITH EXTENDER PER  MESSAGE ON  ECHO RESULTS

## 2018-08-24 NOTE — Telephone Encounter (Signed)
  Caregiver states that someone left a message regarding making an earlier appt to see Virginia Young for her medication management of her lasix for swelling issues. Pt has appt on 09/16/18 with Dr Ellyn Hack. Does she need to be seen sooner?

## 2018-08-24 NOTE — Telephone Encounter (Signed)
Leave message for pt(Kristina RN) to call back

## 2018-08-29 NOTE — Progress Notes (Signed)
Cardiology Office Note   Date:  09/01/2018   ID:  Virginia Young, DOB 12-07-1940, MRN [id number]  PCP:  Baruch Gouty, FNP  Cardiologist: Dr. Percival Spanish  Chief Complaint  Patient presents with  . Atrial Fibrillation  . Cardiomyopathy    NICM EF of 25%     History of Present Illness: Virginia Young is a 78 y.o. female who presents for ongoing assessment and management of atrial fibrillation,CHADS VASC Score of 2 on apixaban, NICM most recent echocardiogram EF of  25%-30% on 08/13/18. Was last seen on 08/05/2018 and was found to have orthostatic BP, and was O2 dependent.   She is here today for medication titration and addition of Entresto if possible. She has dementia and is a poor historian She is accompanied by her aid who is well versed on her history. She states that she has days when her breathing status is normal and other days when she is struggling to breath. She is compliant with lasix 20 mg daily.She sometimes walks outside but becomes fatigued. She denies bleeding or excessive bruising on Eliquis. She states that when she has worsening breathing, she feels as if she will "black out.' She has not been observed to by pre-syncopal or having syncopal episodes by her aid.   She is scheduled for PFT's in 2 weeks with follow up appointment with PCP at [facility] practice. She is in a wheel chair today. She is not wearing O2, and aid states she does not have it at home.   Past Medical History:  Diagnosis Date  . Allergy   . Atrial fibrillation (HCC)    coumadin  . Cataract   . Chest pain 03/2012   a. Lex MV 10/13:  EF 64%, dist ant and apical defect sugg of soft tissue atten, no ischemia  . Chronic systolic heart failure ([place])   . Depression   . GERD (gastroesophageal reflux disease)   . HLD (hyperlipidemia)   . Mild mitral regurgitation   . MS (multiple sclerosis) ([place])   . NICM (nonischemic cardiomyopathy) ([place])    a. neg CLite in 2003;  b. EF 40-45% in  past;   c.  Echo 12/11: EF 35-40%, mild MR, mild LAE, mild RAE, small pericardial effusion   . Osteoarthritis   . Osteoporosis   . Stasis ulcer ([place])   . Vaginal prolapse without uterine prolapse   . Varicose veins     Past Surgical History:  Procedure Laterality Date  . ANTERIOR AND POSTERIOR VAGINAL REPAIR W/ SACROSPINOUS LIGAMENT SUSPENSION  2016   [facility]  . BLADDER SURGERY    . CARPAL TUNNEL RELEASE Right 08/22/2017   Procedure: CARPAL TUNNEL RELEASE;  Surgeon: [name], MD;  Location: [facility];  Service: Orthopedics;  Laterality: Right;  . cataract surgery Bilateral   . EYE SURGERY    . INGUINAL HERNIA REPAIR     right  . LIPOMA EXCISION     h/o removed from upper back x 2  . REVERSE SHOULDER ARTHROPLASTY Right 08/22/2017   Procedure: REVERSE SHOULDER ARTHROPLASTY;  Surgeon: [name], MD;  Location: [facility];  Service: Orthopedics;  Laterality: Right;     Current Outpatient Medications  Medication Sig Dispense Refill  . CVS D3 25 MCG (1000 UT) capsule TAKE 1 CAPSULE (1,000 UNITS TOTAL) BY MOUTH DAILY. 90 capsule 1  . divalproex (DEPAKOTE ER) 250 MG 24 hr tablet Take 1 tablet (250 mg total) by mouth daily. 30 tablet 6  . ELIQUIS 5  MG TABS tablet TAKE 1 TABLET BY MOUTH EVERY 12 HOURS 60 tablet 2  . furosemide (LASIX) 20 MG tablet Take 1 tablet (20 mg total) by mouth daily. 30 tablet 6  . LINZESS 145 MCG CAPS capsule TAKE 1 CAPSULE (145 MCG TOTAL) BY MOUTH DAILY BEFORE BREAKFAST. 90 capsule 0  . Multiple Vitamin (MULTIVITAMIN) tablet Take 1 tablet by mouth daily.    . QUEtiapine (SEROQUEL) 50 MG tablet TAKE 1 TABLET BY MOUTH TWICE A DAY 180 tablet 4   No current facility-administered medications for this visit.     Allergies:   Bactrim [sulfamethoxazole-trimethoprim]; Lipitor [atorvastatin calcium]; and Lisinopril    Social History:  The patient  reports that she has never smoked. She has never used smokeless tobacco. She reports that she does not drink alcohol or use drugs.    Family History:  The patient's family history includes CAD in an other family member; Cancer in her sister and sister; Dementia in her brother and mother; Diabetes in her daughter, sister, and sister; Diabetes (age of onset: 27) in her father; Heart attack in her brother, brother, father, and sister; Stroke in her brother.    ROS: All other systems are reviewed and negative. Unless otherwise mentioned in H&P    PHYSICAL EXAM: VS:  BP (!) 90/52   Pulse (!) 55   Ht 5\' 10"  (1.778 m)   Wt 185 lb (83.9 kg)   BMI 26.54 kg/m  , BMI Body mass index is 26.54 kg/m. GEN: Well nourished, well developed, in no acute distress HEENT: normal Neck: no JVD, carotid bruits, or masses Cardiac: RRR; 1/6 systolic  murmurs, rubs, or gallops,no edema  Respiratory:  Clear to auscultation bilaterally, normal work of breathing GI: soft, nontender, nondistended, + BS MS: no deformity or atrophy, significant varicosities are noted in the bilateral LE Skin: warm and dry, no rash Neuro:  Strength and sensation are intact Psych: euthymic mood, full affect   EKG:  Not completed this office visit.   Recent Labs: 02/02/2018: ALT 17; TSH 1.420 06/10/2018: BUN 25; Creatinine, Ser 1.25; Potassium 4.3; Sodium 142 08/05/2018: BNP 152.9; Hemoglobin 14.1; Platelets 237    Lipid Panel    Component Value Date/Time   CHOL 196 05/15/2015 0827   TRIG 74 05/15/2015 0827   HDL 73 05/15/2015 0827   CHOLHDL 2.7 05/15/2015 0827   CHOLHDL 3 03/22/2013 1126   VLDL 17.8 03/22/2013 1126   LDLCALC 108 (H) 05/15/2015 0827      Wt Readings from Last 3 Encounters:  09/01/18 185 lb (83.9 kg)  08/05/18 185 lb 3.2 oz (84 kg)  07/07/18 176 lb (79.8 kg)      Other studies Reviewed: Echocardiogram September 04, 2018 1. The left ventricle has severely reduced systolic function, with an ejection fraction of 25-30%. The cavity size was normal. There is moderate eccentric left ventricular hypertrophy. Left ventricular diastolic Doppler  parameters are consistent with  impaired relaxation.  2. The right ventricle has normal systolic function. The cavity was normal. There is no increase in right ventricular wall thickness.  3. Left atrial size was mildly dilated.  4. Right atrial size was mildly dilated.  5. Trivial pericardial effusion is present.  6. The mitral valve is normal in structure.  7. The tricuspid valve is normal in structure.  8. The aortic valve is normal in structure.  9. The pulmonic valve was normal in structure. 10. The aortic root and ascending aorta are normal in size and structure. 11. The interatrial septum  was not assessed.  ASSESSMENT AND PLAN:  1. NICM: Heart rate is well controlled. She is slightly dyspneic when talking. O2 sat  91%.  She is not on O2 at home. She remains on lasix without evidence of volume overload. Will not adjust her dose up for now. BP is low normal for her cardiac output. I am reluctant to start her on Entresto at this time due to her hypotension and frailty.   2.Chronic DOE: She is to have PFT per primary care. I believe she has NYHA Class III-IV dyspnea with her reduced EF and frailty. She may need to have O2 support at home. Will allow PCP to evaluate further.   3 Atrial fib: Heart rate is well controlled currently but she expresses that her HR increases when she is doing minimal activity.  She remains on Eliquis. She is not on rate control medications at this time.   I spoke with Dr. Percival Spanish concerning her case and he recommends that she be seen by pulmonary specialist for O2 support and further testing. He would not recommend PFT's unless pulmonary recommends.    Current medicines are reviewed at length with the patient today.    Labs/ tests ordered today include: None  Phill Myron. [place] Pugh, ANP, AACC   09/01/2018 12:45 PM    [facility] [address] Office [phone] Fax [phone]

## 2018-09-01 ENCOUNTER — Ambulatory Visit (INDEPENDENT_AMBULATORY_CARE_PROVIDER_SITE_OTHER): Payer: Medicare Other | Admitting: Adult Health

## 2018-09-01 ENCOUNTER — Encounter: Payer: Self-pay | Admitting: Adult Health

## 2018-09-01 ENCOUNTER — Other Ambulatory Visit: Payer: Self-pay

## 2018-09-01 VITALS — BP 90/52 | HR 55 | Ht 70.0 in | Wt 185.0 lb

## 2018-09-01 DIAGNOSIS — I4819 Other persistent atrial fibrillation: Secondary | ICD-10-CM

## 2018-09-01 DIAGNOSIS — I43 Cardiomyopathy in diseases classified elsewhere: Secondary | ICD-10-CM | POA: Diagnosis not present

## 2018-09-01 DIAGNOSIS — E7800 Pure hypercholesterolemia, unspecified: Secondary | ICD-10-CM | POA: Diagnosis not present

## 2018-09-01 DIAGNOSIS — R0600 Dyspnea, unspecified: Secondary | ICD-10-CM | POA: Diagnosis not present

## 2018-09-01 NOTE — Patient Instructions (Signed)
WE WILL CALL YOU BACK THIS AFTERNOON WITH YOUR DIRECTIONS.  At [facility], you and your health needs are our priority.  As part of our continuing mission to provide you with exceptional heart care, we have created designated Provider Care Teams.  These Care Teams include your primary Cardiologist (physician) and Advanced Practice Providers (APPs -  Physician Assistants and Nurse Practitioners) who all work together to provide you with the care you need, when you need it.  Thank you for choosing [facility] at [facility]!!

## 2018-09-09 ENCOUNTER — Other Ambulatory Visit: Payer: Self-pay

## 2018-09-09 ENCOUNTER — Ambulatory Visit: Payer: Medicare Other | Admitting: Family Medicine

## 2018-09-09 ENCOUNTER — Ambulatory Visit (HOSPITAL_COMMUNITY)
Admission: RE | Admit: 2018-09-09 | Discharge: 2018-09-09 | Disposition: A | Discharge status: Home / Self Care | Payer: Medicare Other | Source: Ambulatory Visit | Attending: Cardiology | Admitting: Cardiology

## 2018-09-09 DIAGNOSIS — R0602 Shortness of breath: Secondary | ICD-10-CM | POA: Diagnosis not present

## 2018-09-09 LAB — PULMONARY FUNCTION TEST
DL/VA % pred: 70 %
DL/VA: 2.79 ml/min/mmHg/L
DLCO unc % pred: 50 %
DLCO unc: 11.21 ml/min/mmHg
FEF 25-75 Post: 2.58 L/sec
FEF 25-75 Pre: 2.35 L/sec
FEF2575-%Change-Post: 9 %
FEF2575-%Pred-Post: 142 %
FEF2575-%Pred-Pre: 129 %
FEV1-%Change-Post: 4 %
FEV1-%Pred-Post: 86 %
FEV1-%Pred-Pre: 83 %
FEV1-Post: 2.17 L
FEV1-Pre: 2.09 L
FEV1FVC-%Change-Post: 1 %
FEV1FVC-%Pred-Pre: 110 %
FEV6-%Change-Post: 0 %
FEV6-%Pred-Post: 79 %
FEV6-%Pred-Pre: 79 %
FEV6-Post: 2.53 L
FEV6-Pre: 2.53 L
FEV6FVC-%Change-Post: 0 %
FEV6FVC-%Pred-Post: 105 %
FEV6FVC-%Pred-Pre: 104 %
FVC-%Change-Post: 2 %
FVC-%Pred-Post: 78 %
FVC-%Pred-Pre: 76 %
FVC-Post: 2.61 L
FVC-Pre: 2.54 L
Post FEV1/FVC ratio: 83 %
Post FEV6/FVC ratio: 100 %
Pre FEV1/FVC ratio: 82 %
Pre FEV6/FVC Ratio: 100 %
RV % pred: 84 %
RV: 2.19 L
TLC % pred: 80 %
TLC: 4.66 L

## 2018-09-09 MED ORDER — ALBUTEROL SULFATE (2.5 MG/3ML) 0.083% IN NEBU
2.5000 mg | INHALATION_SOLUTION | Freq: Once | RESPIRATORY_TRACT | Status: AC
  Administered 2018-09-09: 2.5 mg via RESPIRATORY_TRACT

## 2018-09-16 ENCOUNTER — Ambulatory Visit: Payer: Medicare Other | Admitting: Cardiology

## 2018-09-30 ENCOUNTER — Telehealth: Payer: Self-pay

## 2018-09-30 NOTE — Telephone Encounter (Signed)
Virtual Visit Pre-Appointment Phone Call  Steps For Call: [place] PHONE   1. Confirm consent - "In the setting of the current Covid19 crisis, you are scheduled for a (phone or video) visit with your provider on (date) at (time).  Just as we do with many in-office visits, in order for you to participate in this visit, we must obtain consent.  If you'd like, I can send this to your mychart (if signed up) or email for you to review.  Otherwise, I can obtain your verbal consent now.  All virtual visits are billed to your insurance company just like a normal visit would be.  By agreeing to a virtual visit, we'd like you to understand that the technology does not allow for your provider to perform an examination, and thus may limit your provider's ability to fully assess your condition.  Finally, though the technology is pretty good, we cannot assure that it will always work on either your or our end, and in the setting of a video visit, we may have to convert it to a phone-only visit.  In either situation, we cannot ensure that we have a secure connection.  Are you willing to proceed?"  2. Give patient instructions for WebEx download to smartphone as below if video visit  3. Advise patient to be prepared with any vital sign or heart rhythm information, their current medicines, and a piece of paper and pen handy for any instructions they may receive the day of their visit  4. Inform patient they will receive a phone call 15 minutes prior to their appointment time (may be from unknown caller ID) so they should be prepared to answer  5. Confirm that appointment type is correct in Epic appointment notes (video vs telephone)    TELEPHONE CALL NOTE  Virginia Young has been deemed a candidate for a follow-up tele-health visit to limit community exposure during the Covid-19 pandemic. I spoke with the patient via phone to ensure availability of phone/video source, confirm preferred email &  phone number, and discuss instructions and expectations.  I reminded JANNESSA OGDEN to be prepared with any vital sign and/or heart rhythm information that could potentially be obtained via home monitoring, at the time of her visit. I reminded ASYIA HORNUNG to expect a phone call at the time of her visit if her visit.  Did the patient verbally acknowledge consent to treatment?   [place] 09/30/2018 2:20 PM   DOWNLOADING THE [place]  - If Apple, go to [employer] and type in WebEx in the search bar. [place] [employer], the blue/Keiasha Diep circle. The app is free but as with any other app downloads, their phone may require them to verify saved payment information or Apple password. The patient does NOT have to create an account.  - If Android, ask patient to go to [employer] and type in WebEx in the search bar. [place] [employer], the blue/Dawnisha Marquina circle. The app is free but as with any other app downloads, their phone may require them to verify saved payment information or Android password. The patient does NOT have to create an account.   CONSENT FOR TELE-HEALTH VISIT - PLEASE REVIEW  I hereby voluntarily request, consent and authorize [facility] and its employed or contracted physicians, physician assistants, nurse practitioners or other licensed health care professionals (the Practitioner), to provide me with telemedicine health care services (the "Services") as deemed  necessary by the treating Practitioner. I acknowledge and consent to receive the Services by the Practitioner via telemedicine. I understand that the telemedicine visit will involve communicating with the Practitioner through live audiovisual communication technology and the disclosure of certain medical information by electronic transmission. I acknowledge that I have been given the opportunity to request an in-person assessment or other available alternative prior to  the telemedicine visit and am voluntarily participating in the telemedicine visit.  I understand that I have the right to withhold or withdraw my consent to the use of telemedicine in the course of my care at any time, without affecting my right to future care or treatment, and that the Practitioner or I may terminate the telemedicine visit at any time. I understand that I have the right to inspect all information obtained and/or recorded in the course of the telemedicine visit and may receive copies of available information for a reasonable fee.  I understand that some of the potential risks of receiving the Services via telemedicine include:  Marland Kitchen Delay or interruption in medical evaluation due to technological equipment failure or disruption; . Information transmitted may not be sufficient (e.g. poor resolution of images) to allow for appropriate medical decision making by the Practitioner; and/or  . In rare instances, security protocols could fail, causing a breach of personal health information.  Furthermore, I acknowledge that it is my responsibility to provide information about my medical history, conditions and care that is complete and accurate to the best of my ability. I acknowledge that Practitioner's advice, recommendations, and/or decision may be based on factors not within their control, such as incomplete or inaccurate data provided by me or distortions of diagnostic images or specimens that may result from electronic transmissions. I understand that the practice of medicine is not an exact science and that Practitioner makes no warranties or guarantees regarding treatment outcomes. I acknowledge that I will receive a copy of this consent concurrently upon execution via email to the email address I last provided but may also request a printed copy by calling the office of [place].    I understand that my insurance will be billed for this visit.   I have read or had this consent read to  me. . I understand the contents of this consent, which adequately explains the benefits and risks of the Services being provided via telemedicine.  . I have been provided ample opportunity to ask questions regarding this consent and the Services and have had my questions answered to my satisfaction. . I give my informed consent for the services to be provided through the use of telemedicine in my medical care  By participating in this telemedicine visit I agree to the above.

## 2018-10-07 ENCOUNTER — Telehealth: Payer: Self-pay | Admitting: Cardiology

## 2018-10-07 NOTE — Progress Notes (Signed)
Virtual Visit via Video Note   This visit type was conducted due to national recommendations for restrictions regarding the COVID-19 Pandemic (e.g. social distancing) in an effort to limit this patient's exposure and mitigate transmission in our community.  Due to her co-morbid illnesses, this patient is at least at moderate risk for complications without adequate follow up.  This format is felt to be most appropriate for this patient at this time.  All issues noted in this document were discussed and addressed.  A limited physical exam was performed with this format.  Please refer to the patient's chart for her consent to telehealth for [facility]   Evaluation Performed:  Follow-up visit  Date:  10/08/2018   ID:  Virginia Young, DOB 03-20-1941, MRN [id number]  Patient Location: Home Provider Location: Home  PCP:  Baruch Gouty, FNP  Cardiologist:  Minus Breeding, MD  Electrophysiologist:  None   Chief Complaint:  Dyspnea  History of Present Illness:    Virginia Young is a 78 y.o. female who presents for ongoing assessment and management of atrial fibrillation and NICM ( EF of  25%-30% on 08/13/18).  She has had orthostatic hypotension.  Since I last saw her she had PFTs.    This demonstrated a diffusion defect but it was unclear to me the clinical relevance of this.    She does have reduced ejection fraction although her BNP was not particularly elevated in the past.  She has had some mild renal insufficiency recently.  She is had some lower blood pressure so we have not titrated her medications despite the fact that she does have a reduced ejection fraction.  Today she looks comfortable and she is not having any acute complaints but she still gets short of breath with minimal exertion.  She has some leg swelling.  She thinks she is gained a little weight.  They do not have a blood pressure cuff at home so I do not know what her blood pressure is.  She is there with her  caregiver.  She denies any chest pressure, neck or arm discomfort.  There is been no new palpitations, presyncope or syncope.  Of note she cannot wear her compression stockings because they are too tight.me.   The patient does not have symptoms concerning for COVID-19 infection (fever, chills, cough, or new shortness of breath).    Past Medical History:  Diagnosis Date  . Allergy   . Atrial fibrillation (HCC)    coumadin  . Cataract   . Chest pain 03/2012   a. Lex MV 10/13:  EF 64%, dist ant and apical defect sugg of soft tissue atten, no ischemia  . Chronic systolic heart failure ([place])   . Depression   . GERD (gastroesophageal reflux disease)   . HLD (hyperlipidemia)   . Mild mitral regurgitation   . MS (multiple sclerosis) ([place])   . NICM (nonischemic cardiomyopathy) ([place])    a. neg CLite in 2003;  b. EF 40-45% in past;   c.  Echo 12/11: EF 35-40%, mild MR, mild LAE, mild RAE, small pericardial effusion   . Osteoarthritis   . Osteoporosis   . Stasis ulcer ([place])   . Vaginal prolapse without uterine prolapse   . Varicose veins    Past Surgical History:  Procedure Laterality Date  . ANTERIOR AND POSTERIOR VAGINAL REPAIR W/ SACROSPINOUS LIGAMENT SUSPENSION  2016   [facility]  . BLADDER SURGERY    . CARPAL TUNNEL RELEASE Right 08/22/2017  Procedure: CARPAL TUNNEL RELEASE;  Surgeon: [name], MD;  Location: [place];  Service: Orthopedics;  Laterality: Right;  . cataract surgery Bilateral   . EYE SURGERY    . INGUINAL HERNIA REPAIR     right  . LIPOMA EXCISION     h/o removed from upper back x 2  . REVERSE SHOULDER ARTHROPLASTY Right 08/22/2017   Procedure: REVERSE SHOULDER ARTHROPLASTY;  Surgeon: [name], MD;  Location: [place];  Service: Orthopedics;  Laterality: Right;     Prior to Admission medications   Medication Sig Start Date End Date Taking? Authorizing Provider  CVS D3 25 MCG (1000 UT) capsule TAKE 1 CAPSULE (1,000 UNITS TOTAL) BY MOUTH DAILY. 05/18/18  Yes Eustaquio Maize, MD  divalproex (DEPAKOTE ER) 250 MG 24 hr tablet Take 1 tablet (250 mg total) by mouth daily. 07/07/18  Yes Melvenia Beam, MD  ELIQUIS 5 MG TABS tablet TAKE 1 TABLET BY MOUTH EVERY 12 HOURS 08/03/18  Yes Rakes, Connye Burkitt, FNP  furosemide (LASIX) 20 MG tablet Take 1 tablet (20 mg total) by mouth daily. 08/11/18 11/09/18 Yes Addam Goeller, Jeneen Rinks, MD  LINZESS 145 MCG CAPS capsule TAKE 1 CAPSULE (145 MCG TOTAL) BY MOUTH DAILY BEFORE BREAKFAST. 12/22/17  Yes Eustaquio Maize, MD  Multiple Vitamin (MULTIVITAMIN) tablet Take 1 tablet by mouth daily.   Yes [provider]  QUEtiapine (SEROQUEL) 50 MG tablet TAKE 1 TABLET BY MOUTH TWICE A DAY 03/26/18  Yes Melvenia Beam, MD     Allergies:   Bactrim [sulfamethoxazole-trimethoprim]; Lipitor [atorvastatin calcium]; and Lisinopril   Social History   Tobacco Use  . Smoking status: Never Smoker  . Smokeless tobacco: Never Used  Substance Use Topics  . Alcohol use: No  . Drug use: No     Family Hx: The patient's family history includes CAD in an other family member; Cancer in her sister and sister; Dementia in her brother and mother; Diabetes in her daughter, sister, and sister; Diabetes (age of onset: 17) in her father; Heart attack in her brother, brother, father, and sister; Stroke in her brother. There is no history of Colon cancer or Neuropathy.  ROS:   Please see the history of present illness.    As stated in the HPI and negative for all other systems.   Prior CV studies:   The following studies were reviewed today:  PFTs  Labs/Other Tests and Data Reviewed:    EKG:  No ECG reviewed.  Recent Labs: 02/02/2018: ALT 17; TSH 1.420 06/10/2018: BUN 25; Creatinine, Ser 1.25; Potassium 4.3; Sodium 142 08/05/2018: BNP 152.9; Hemoglobin 14.1; Platelets 237   Recent Lipid Panel Lab Results  Component Value Date/Time   CHOL 196 05/15/2015 08:27 AM   TRIG 74 05/15/2015 08:27 AM   HDL 73 05/15/2015 08:27 AM   CHOLHDL 2.7 05/15/2015  08:27 AM   CHOLHDL 3 03/22/2013 11:26 AM   LDLCALC 108 (H) 05/15/2015 08:27 AM    Wt Readings from Last 3 Encounters:  10/08/18 184 lb (83.5 kg)  09/01/18 185 lb (83.9 kg)  08/05/18 185 lb 3.2 oz (84 kg)     Objective:    Vital Signs:  Ht 5\' 10"  (1.778 m)   Wt 184 lb (83.5 kg) Comment: Unable to obtain  BMI 26.40 kg/m    Well nourished, well developed female in no acute distress. Leg edema  ASSESSMENT & PLAN:    NICM:    I am going to have her take her Lasix 40  mg a day.  She needs to keep her feet elevated and get a new pair of compression stockings and so when she has her to tight.  I am going to need to see her back in the office in 2 weeks to check her labs and try to titrate medications.  Probably her diffusion capacity is related to vascular congestion.  She might also if we walk around the office qualify for home O2 which would help.  Ultimately she might be pulmonary office follow-up and possibly right and left heart catheterization.    Chronic DOE:    This will be managed as above.  Atrial fib:    Tolerates rate control and anticoagulation.  No change in therapy.  She is had no bleeding issues.   COVID-19 Education: The signs and symptoms of COVID-19 were discussed with the patient and how to seek care for testing (follow up with PCP or arrange E-visit).  The importance of social distancing was discussed today.  Time:   Today, I have spent 25 minutes with the patient with telehealth technology discussing the above problems.     Medication Adjustments/Labs and Tests Ordered: Current medicines are reviewed at length with the patient today.  Concerns regarding medicines are outlined above.   Tests Ordered: No orders of the defined types were placed in this encounter.   Medication Changes: No orders of the defined types were placed in this encounter.   Disposition:  Follow up in 2 week(s)  Signed, Minus Breeding, MD  10/08/2018 10:35 AM    [place]

## 2018-10-07 NOTE — Telephone Encounter (Signed)
Mychart, pre reg complete 10/07/18 AF

## 2018-10-08 ENCOUNTER — Telehealth (INDEPENDENT_AMBULATORY_CARE_PROVIDER_SITE_OTHER): Payer: Medicare Other | Admitting: Cardiology

## 2018-10-08 ENCOUNTER — Encounter: Payer: Self-pay | Admitting: Cardiology

## 2018-10-08 VITALS — Ht 70.0 in | Wt 184.0 lb

## 2018-10-08 DIAGNOSIS — I4819 Other persistent atrial fibrillation: Secondary | ICD-10-CM

## 2018-10-08 DIAGNOSIS — I5022 Chronic systolic (congestive) heart failure: Secondary | ICD-10-CM | POA: Insufficient documentation

## 2018-10-08 DIAGNOSIS — Z7189 Other specified counseling: Secondary | ICD-10-CM

## 2018-10-08 DIAGNOSIS — Z79899 Other long term (current) drug therapy: Secondary | ICD-10-CM

## 2018-10-08 MED ORDER — FUROSEMIDE 40 MG PO TABS: 40.0000 mg | ORAL_TABLET | Freq: Every day | ORAL | 3 refills | Status: DC

## 2018-10-08 NOTE — Patient Instructions (Addendum)
Medication Instructions:  INCREASE- Lasix 40 mg daily  If you need a refill on your cardiac medications before your next appointment, please call your pharmacy.  Labwork: BMP in 2 weeks  Testing/Procedures: None Ordered   Follow-Up: . Your physician recommends that you schedule a follow-up appointment in: April 29th in [place], you and your health needs are our priority.  As part of our continuing mission to provide you with exceptional heart care, we have created designated Provider Care Teams.  These Care Teams include your primary Cardiologist (physician) and Advanced Practice Providers (APPs -  Physician Assistants and Nurse Practitioners) who all work together to provide you with the care you need, when you need it.  Thank you for choosing [facility] at [facility]!!

## 2018-10-22 ENCOUNTER — Ambulatory Visit: Payer: Medicare Other | Admitting: Cardiology

## 2018-10-24 ENCOUNTER — Other Ambulatory Visit: Payer: Self-pay | Admitting: Pediatrics

## 2018-10-24 DIAGNOSIS — I480 Paroxysmal atrial fibrillation: Secondary | ICD-10-CM

## 2018-10-28 NOTE — Progress Notes (Signed)
HPI The patient presents for evaluation of atrial fibrillation and nonischemic cardiomyopathy.  She has a mildly reduced ejection fraction of 40% in 04/2016.  She has had orthostatic hypotension.  She had PFTs.    This demonstrated a diffusion defect but it was unclear to me the clinical relevance of this although it was likely to relate to edema.  I increased her diuretic  At a recent telehealth visit she had leg swelling and weight gain.    She presents with continued significant shortness of breath.  This is with minimal activity.   Her weight is up slightly.  She does not necessarily avoid salt completely.   The patient does not have symptoms concerning for COVID-19 infection (fever, chills, cough, or new shortness of breath).    Allergies  Allergen Reactions  . Bactrim [Sulfamethoxazole-Trimethoprim]     Mouth sores  . Lipitor [Atorvastatin Calcium]     myalgia  . Lisinopril     cough    Current Outpatient Medications  Medication Sig Dispense Refill  . CVS D3 25 MCG (1000 UT) capsule TAKE 1 CAPSULE (1,000 UNITS TOTAL) BY MOUTH DAILY. 90 capsule 1  . divalproex (DEPAKOTE ER) 250 MG 24 hr tablet Take 1 tablet (250 mg total) by mouth daily. 30 tablet 6  . ELIQUIS 5 MG TABS tablet TAKE 1 TABLET BY MOUTH EVERY 12 HOURS 60 tablet 2  . furosemide (LASIX) 20 MG tablet Take 3 tablets (60 mg total) by mouth daily. 90 tablet 11  . LINZESS 145 MCG CAPS capsule TAKE 1 CAPSULE (145 MCG TOTAL) BY MOUTH DAILY BEFORE BREAKFAST. 90 capsule 0  . Multiple Vitamin (MULTIVITAMIN) tablet Take 1 tablet by mouth daily.    . QUEtiapine (SEROQUEL) 50 MG tablet TAKE 1 TABLET BY MOUTH TWICE A DAY 180 tablet 4  . losartan (COZAAR) 25 MG tablet Take 0.5 tablets (12.5 mg total) by mouth daily. 45 tablet 3   No current facility-administered medications for this visit.     Past Medical History:  Diagnosis Date  . Allergy   . Atrial fibrillation (HCC)    coumadin  . Cataract   . Chest pain  03/2012   a. Lex MV 10/13:  EF 64%, dist ant and apical defect sugg of soft tissue atten, no ischemia  . Chronic systolic heart failure ([place])   . Depression   . GERD (gastroesophageal reflux disease)   . HLD (hyperlipidemia)   . Mild mitral regurgitation   . MS (multiple sclerosis) ([place])   . NICM (nonischemic cardiomyopathy) ([place])    a. neg CLite in 2003;  b. EF 40-45% in past;   c.  Echo 12/11: EF 35-40%, mild MR, mild LAE, mild RAE, small pericardial effusion   . Osteoarthritis   . Osteoporosis   . Stasis ulcer ([place])   . Vaginal prolapse without uterine prolapse   . Varicose veins     Past Surgical History:  Procedure Laterality Date  . ANTERIOR AND POSTERIOR VAGINAL REPAIR W/ SACROSPINOUS LIGAMENT SUSPENSION  2016   [facility]  . BLADDER SURGERY    . CARPAL TUNNEL RELEASE Right 08/22/2017   Procedure: CARPAL TUNNEL RELEASE;  Surgeon: [name], MD;  Location: [place];  Service: Orthopedics;  Laterality: Right;  . cataract surgery Bilateral   . EYE SURGERY    . INGUINAL HERNIA REPAIR     right  . LIPOMA EXCISION     h/o removed from upper back x 2  . REVERSE SHOULDER ARTHROPLASTY Right  08/22/2017   Procedure: REVERSE SHOULDER ARTHROPLASTY;  Surgeon: [name], MD;  Location: [place];  Service: Orthopedics;  Laterality: Right;    ROS:    As stated in the HPI and negative for all other systems.   PHYSICAL EXAM BP 112/73   Pulse 97   Ht 5\' 10"  (1.778 m)   Wt 195 lb 6.4 oz (88.6 kg)   BMI 28.04 kg/m  HR 64, BP 102/58 without orthostatic BP drop.  GENERAL:  Well appearing NECK:  No jugular venous distention, waveform within normal limits, carotid upstroke brisk and symmetric, no bruits, no thyromegaly LUNGS:  Clear to auscultation bilaterally CHEST:  Unremarkable HEART:  PMI not displaced or sustained,S1 and S2 within normal limits, no S3, no S4, no clicks, no rubs, no murmurs, irregular ABD:  Flat, positive bowel sounds normal in frequency in pitch, no bruits, no rebound, no  guarding, no midline pulsatile mass, no hepatomegaly, no splenomegaly EXT:  2 plus pulses throughout, no edema, no cyanosis no clubbing    EKG:   Atrial fibrillation, rate 91, leftward axis, premature ectopic complexes, no acute ST-T wave changes.  Lab Results  Component Value Date   TSH 1.420 02/02/2018   ALT 17 02/02/2018   AST 20 02/02/2018   ALKPHOS 143 (H) 02/02/2018   BILITOT 0.5 02/02/2018   PROT 6.6 02/02/2018   ALBUMIN 4.0 02/02/2018    ASSESSMENT AND PLAN  DYSPNEA:     I suspect her dyspnea is related to her worsening ejection fraction which is now 20 to 25%.  Has not tolerated ACE inhibitors in the past.  She has low blood pressure at baseline.  I am going to try to gingerly start 12 and half milligrams of Cozaar.  He can increase her Lasix a little bit to 60 mg daily.  She will need to have a basic metabolic profile in 10 days.  We talked about reducing salt.  I will order home O2  Patient Saturations on Room Air at Rest = 96%  Patient Saturations on Room Air while Ambulating = 88%  Patient Saturations on 2 Liters of oxygen while Ambulating = 98%  Please briefly explain why patient needs home oxygen:  CARDIOMYOPATHY:    See above.    ATRIAL FIB:  Virginia Young has a CHA2DS2 - VASc score of 3 with a risk of stroke of 3.2%.   She will continue anticoagulation.

## 2018-10-29 ENCOUNTER — Other Ambulatory Visit: Payer: Self-pay

## 2018-10-29 ENCOUNTER — Ambulatory Visit (INDEPENDENT_AMBULATORY_CARE_PROVIDER_SITE_OTHER): Payer: Medicare Other | Admitting: Cardiology

## 2018-10-29 ENCOUNTER — Encounter: Payer: Self-pay | Admitting: Cardiology

## 2018-10-29 VITALS — BP 112/73 | HR 97 | Ht 70.0 in | Wt 195.4 lb

## 2018-10-29 DIAGNOSIS — Z79899 Other long term (current) drug therapy: Secondary | ICD-10-CM

## 2018-10-29 DIAGNOSIS — R0602 Shortness of breath: Secondary | ICD-10-CM | POA: Diagnosis not present

## 2018-10-29 DIAGNOSIS — I5022 Chronic systolic (congestive) heart failure: Secondary | ICD-10-CM | POA: Diagnosis not present

## 2018-10-29 MED ORDER — FUROSEMIDE 20 MG PO TABS: 60.0000 mg | ORAL_TABLET | Freq: Every day | ORAL | 11 refills | Status: DC

## 2018-10-29 MED ORDER — LOSARTAN POTASSIUM 25 MG PO TABS: 12.5000 mg | ORAL_TABLET | Freq: Every day | ORAL | 3 refills | Status: DC

## 2018-10-29 NOTE — Patient Instructions (Signed)
Medication Instructions:  INCREASE- Furosemide(lasix) 60 mg daily START- Losartan 12.5 mg daily  If you need a refill on your cardiac medications before your next appointment, please call your pharmacy.  Labwork: BMP in 10 days HERE IN OUR OFFICE AT [facility]  You will NOT need to fast   Take the provided lab slips with you to the lab for your blood draw.   When you have your labs (blood work) drawn today and your tests are completely normal, you will receive your results only by MyChart Message (if you have MyChart) -OR-  A paper copy in the mail.  If you have any lab test that is abnormal or we need to change your treatment, we will call you to review these results.  Testing/Procedures: None Ordered   Follow-Up: . Your physician recommends that you schedule a follow-up appointment in: 2 Months .   At [facility], you and your health needs are our priority.  As part of our continuing mission to provide you with exceptional heart care, we have created designated Provider Care Teams.  These Care Teams include your primary Cardiologist (physician) and Advanced Practice Providers (APPs -  Physician Assistants and Nurse Practitioners) who all work together to provide you with the care you need, when you need it.  Thank you for choosing [facility] at [facility]!!

## 2018-10-30 NOTE — Addendum Note (Signed)
Addended by: Meryl Crutch on: 10/30/2018 01:30 PM   Modules accepted: Orders

## 2018-11-03 ENCOUNTER — Telehealth: Payer: Self-pay | Admitting: Cardiology

## 2018-11-03 NOTE — Telephone Encounter (Signed)
Attempted to contact number provided by scheduler and was instructed we had the wrong number. Call number listed on chart and left message to call back.

## 2018-11-03 NOTE — Telephone Encounter (Signed)
New Message     Pt is calling and is wondering if they get can get their blood work in the [place] office   Please call

## 2018-11-04 ENCOUNTER — Other Ambulatory Visit: Payer: Self-pay

## 2018-11-04 ENCOUNTER — Other Ambulatory Visit: Payer: Medicare Other

## 2018-11-04 DIAGNOSIS — Z79899 Other long term (current) drug therapy: Secondary | ICD-10-CM | POA: Diagnosis not present

## 2018-11-05 LAB — BASIC METABOLIC PANEL
BUN/Creatinine Ratio: 18 (ref 12–28)
BUN: 20 mg/dL (ref 8–27)
CO2: 27 mmol/L (ref 20–29)
Calcium: 9.2 mg/dL (ref 8.7–10.3)
Chloride: 99 mmol/L (ref 96–106)
Creatinine, Ser: 1.12 mg/dL — ABNORMAL HIGH (ref 0.57–1.00)
GFR calc Af Amer: 54 mL/min/{1.73_m2} — ABNORMAL LOW (ref >59)
GFR calc non Af Amer: 47 mL/min/{1.73_m2} — ABNORMAL LOW (ref >59)
Glucose: 63 mg/dL — ABNORMAL LOW (ref 65–99)
Potassium: 4.1 mmol/L (ref 3.5–5.2)
Sodium: 140 mmol/L (ref 134–144)

## 2018-11-05 NOTE — Telephone Encounter (Signed)
Lm to call back ./cy 

## 2018-11-09 NOTE — Telephone Encounter (Signed)
Labs results are in epic from 11/04/18./cy

## 2019-01-07 ENCOUNTER — Telehealth: Payer: Self-pay | Admitting: Cardiology

## 2019-01-07 NOTE — Progress Notes (Signed)
HPI The patient presents for evaluation of atrial fibrillation and nonischemic cardiomyopathy.  She has a mildly reduced ejection fraction of 40% in 04/2016.  She has had orthostatic hypotension.  She had PFTs.    This demonstrated a diffusion defect but it was unclear to me the clinical relevance of this although it was likely to relate to edema.  I increased her diuretic   She has continued to have increased dyspnea.    At the last visit I ordered O2 and, despite not tolerating meds in the past I added a very low dose of Cozaar and I increased her diuretic.  She seemed to tolerate this slow titration of her meds.  Her breathing is a little bit better.  She still does get some shortness of breath.  I did want her to get oxygen for some reason this did not happen.  Document that she has decreased sats as below.  I think she would still benefit from this.  She is doing better on her salt intake.  I think that fluid management is helping.  She is watching her weights and they are up a little bit but not on a daily basis.  She is not having any new swelling.  She is not describing PND or orthopnea.  She gets around in her home.  She an aide who helps her.    Allergies  Allergen Reactions  . Bactrim [Sulfamethoxazole-Trimethoprim]     Mouth sores  . Lipitor [Atorvastatin Calcium]     myalgia  . Lisinopril     cough    Current Outpatient Medications  Medication Sig Dispense Refill  . CVS D3 25 MCG (1000 UT) capsule TAKE 1 CAPSULE (1,000 UNITS TOTAL) BY MOUTH DAILY. 90 capsule 1  . divalproex (DEPAKOTE ER) 250 MG 24 hr tablet Take 1 tablet (250 mg total) by mouth daily. 30 tablet 6  . ELIQUIS 5 MG TABS tablet TAKE 1 TABLET BY MOUTH EVERY 12 HOURS 60 tablet 2  . furosemide (LASIX) 20 MG tablet Take 3 tablets (60 mg total) by mouth daily. 90 tablet 11  . LINZESS 145 MCG CAPS capsule TAKE 1 CAPSULE (145 MCG TOTAL) BY MOUTH DAILY BEFORE BREAKFAST. 90 capsule 0  . losartan (COZAAR) 25 MG tablet  Take 0.5 tablets (12.5 mg total) by mouth daily. 45 tablet 3  . Multiple Vitamin (MULTIVITAMIN) tablet Take 1 tablet by mouth daily.    . QUEtiapine (SEROQUEL) 50 MG tablet TAKE 1 TABLET BY MOUTH TWICE A DAY 180 tablet 4   No current facility-administered medications for this visit.     Past Medical History:  Diagnosis Date  . Allergy   . Atrial fibrillation (HCC)    coumadin  . Cataract   . Chest pain 03/2012   a. Lex MV 10/13:  EF 64%, dist ant and apical defect sugg of soft tissue atten, no ischemia  . Chronic systolic heart failure ([place])   . Depression   . GERD (gastroesophageal reflux disease)   . HLD (hyperlipidemia)   . Mild mitral regurgitation   . MS (multiple sclerosis) ([place])   . NICM (nonischemic cardiomyopathy) ([place])    a. neg CLite in 2003;  b. EF 40-45% in past;   c.  Echo 12/11: EF 35-40%, mild MR, mild LAE, mild RAE, small pericardial effusion   . Osteoarthritis   . Osteoporosis   . Stasis ulcer ([place])   . Vaginal prolapse without uterine prolapse   . Varicose veins  Past Surgical History:  Procedure Laterality Date  . ANTERIOR AND POSTERIOR VAGINAL REPAIR W/ SACROSPINOUS LIGAMENT SUSPENSION  2016   [facility]  . BLADDER SURGERY    . CARPAL TUNNEL RELEASE Right 08/22/2017   Procedure: CARPAL TUNNEL RELEASE;  Surgeon: [name], MD;  Location: [place];  Service: Orthopedics;  Laterality: Right;  . cataract surgery Bilateral   . EYE SURGERY    . INGUINAL HERNIA REPAIR     right  . LIPOMA EXCISION     h/o removed from upper back x 2  . REVERSE SHOULDER ARTHROPLASTY Right 08/22/2017   Procedure: REVERSE SHOULDER ARTHROPLASTY;  Surgeon: [name], MD;  Location: [place];  Service: Orthopedics;  Laterality: Right;    ROS:    As stated in the HPI and negative for all other systems.  PHYSICAL EXAM BP 120/80   Pulse 98   Temp (!) 97.3 F (36.3 C)   Ht 5\' 10"  (1.778 m)   Wt 198 lb 12.8 oz (90.2 kg)   SpO2 98%   BMI 28.52 kg/m   GENERAL:  Well appearing  NECK:  No jugular venous distention, waveform within normal limits, carotid upstroke brisk and symmetric, no bruits, no thyromegaly LUNGS:  Clear to auscultation bilaterally CHEST:  Unremarkable HEART:  PMI not displaced or sustained,S1 and S2 within normal limits, no S3, no S4, no clicks, no rubs, NO murmurs ABD:  Flat, positive bowel sounds normal in frequency in pitch, no bruits, no rebound, no guarding, no midline pulsatile mass, no hepatomegaly, no splenomegaly EXT:  2 plus pulses throughout, mild left leg edema, no cyanosis no clubbing   EKG:   NA  Lab Results  Component Value Date   TSH 1.420 02/02/2018   ALT 17 02/02/2018   AST 20 02/02/2018   ALKPHOS 143 (H) 02/02/2018   BILITOT 0.5 02/02/2018   PROT 6.6 02/02/2018   ALBUMIN 4.0 02/02/2018    ASSESSMENT AND PLAN  DYSPNEA:     I think this is multifactorial but partly related to her heart failure.  I titrated her meds as much as I can with her low blood pressures in the past and her intolerance of medications.  She is watching her salt.  Her volume seems to be adequate.  She is had documented hypoxemia though as below.  She would benefit from oxygen.  I have ordered home O2.  Patient Saturations on Room Air at Rest = 96%  Patient Saturations on [employer] while Ambulating = 88%  Patient Saturations on 2 Liters of oxygen while Ambulating = 98%   CARDIOMYOPATHY:     No change in meds.  ATRIAL FIB:  Virginia Young has a CHA2DS2 - VASc score of 3 with a risk of stroke of 3.2%.  She tolerates anticoagulation and has had reasonable rate control.

## 2019-01-07 NOTE — Telephone Encounter (Signed)
Follow up         COVID-19 Pre-Screening Questions:   In the past 7 to 10 days have you had a cough,  shortness of breath, headache, congestion, fever (100 or greater) body aches, chills, sore throat, or sudden loss of taste or sense of smell? NO  Have you been around anyone with known Covid 19. NO  Have you been around anyone who is awaiting Covid 19 test results in the past 7 to 10 days? NO  Have you been around anyone who has been exposed to Covid 19, or has mentioned symptoms of Covid 19 within the past 7 to 10 days? NO [name] will be assisting the pt to her appt, she is her caregiver. She answered NO to all screening questions   If you have any concerns/questions about symptoms patients report during screening (either on the phone or at [place]). Contact the provider seeing the patient or DOD for further guidance.  If neither are available contact a member of the leadership team.

## 2019-01-07 NOTE — Telephone Encounter (Signed)
New Message ° ° ° °Left message to confirm appt and answer covid questions  °

## 2019-01-08 ENCOUNTER — Encounter: Payer: Self-pay | Admitting: Cardiology

## 2019-01-08 ENCOUNTER — Ambulatory Visit (INDEPENDENT_AMBULATORY_CARE_PROVIDER_SITE_OTHER): Payer: Medicare Other | Admitting: Cardiology

## 2019-01-08 ENCOUNTER — Other Ambulatory Visit: Payer: Self-pay

## 2019-01-08 VITALS — BP 120/80 | HR 98 | Temp 97.3°F | Ht 70.0 in | Wt 198.8 lb

## 2019-01-08 DIAGNOSIS — I4821 Permanent atrial fibrillation: Secondary | ICD-10-CM | POA: Diagnosis not present

## 2019-01-08 DIAGNOSIS — Z5181 Encounter for therapeutic drug level monitoring: Secondary | ICD-10-CM

## 2019-01-08 DIAGNOSIS — I5022 Chronic systolic (congestive) heart failure: Secondary | ICD-10-CM

## 2019-01-08 NOTE — Patient Instructions (Signed)
Medication Instructions:  Your physician recommends that you continue on your current medications as directed. Please refer to the Current Medication list given to you today.  If you need a refill on your cardiac medications before your next appointment, please call your pharmacy.   Lab work: BMET TODAY   If you have labs (blood work) drawn today and your tests are completely normal, you will receive your results only by: Marland Kitchen MyChart Message (if you have MyChart) OR . A paper copy in the mail If you have any lab test that is abnormal or we need to change your treatment, we will call you to review the results.  Testing/Procedures: NONE   Follow-Up: At [facility], you and your health needs are our priority.  As part of our continuing mission to provide you with exceptional heart care, we have created designated Provider Care Teams.  These Care Teams include your primary Cardiologist (physician) and Advanced Practice Providers (APPs -  Physician Assistants and Nurse Practitioners) who all work together to provide you with the care you need, when you need it. You will need a follow up appointment in 6 months.  Please call our office 2 months in advance to schedule this appointment.  You may see Minus Breeding, MD or one of the following Advanced Practice Providers on your designated Care Team:   Rosaria Ferries, PA-C . Jory Sims, DNP, ANP  Any Other Special Instructions Will Be Listed Below (If Applicable).  WILL ARRANGE FOR YOU TO HAVE HOME OXYGEN  IF YOU DO NOT HEAR FROM [facility] CALL THE OFFICE Tuesday AT [phone]

## 2019-01-09 LAB — BASIC METABOLIC PANEL
BUN/Creatinine Ratio: 16 (ref 12–28)
BUN: 16 mg/dL (ref 8–27)
CO2: 28 mmol/L (ref 20–29)
Calcium: 9.1 mg/dL (ref 8.7–10.3)
Chloride: 95 mmol/L — ABNORMAL LOW (ref 96–106)
Creatinine, Ser: 1.03 mg/dL — ABNORMAL HIGH (ref 0.57–1.00)
GFR calc Af Amer: 60 mL/min/{1.73_m2} (ref >59)
GFR calc non Af Amer: 52 mL/min/{1.73_m2} — ABNORMAL LOW (ref >59)
Glucose: 63 mg/dL — ABNORMAL LOW (ref 65–99)
Potassium: 4.3 mmol/L (ref 3.5–5.2)
Sodium: 137 mmol/L (ref 134–144)

## 2019-01-14 ENCOUNTER — Telehealth: Payer: Self-pay | Admitting: Cardiology

## 2019-01-14 NOTE — Telephone Encounter (Signed)
Called Cruzita Lederer and able to get in contact with her. She states the number provided was the number she was given and that the rep she spoke with told her that '[place]' was formerly known as '[place]'. She stated she would 'Google' the number for [employer] and provided the nurse with the following number: [phone].   Nurse called new number provided. Rep at company stated number is for '[employer]', not [employer]. Nurse contacted Altha Harm to inform her. She states she will contact the number she was originally provided once she gets access to a land line and will call office back at [phone] to update

## 2019-01-14 NOTE — Telephone Encounter (Signed)
  At last visit Dr Warren Lacy ordered at home oxygen for the patient. Her caregiver is calling because [employer] stated they need more documentation to qualify her for the O2. They said the documents that this office sent over were incomplete and does not have enough information to get her covered. Please contact Kennis Carina at [facility] [phone]

## 2019-01-14 NOTE — Telephone Encounter (Signed)
Spoke with Virginia Young pt's caregiver who provided the correct number for [place]. Nurse called Virginia Young at [phone] ext. [phone] who report the order that was received,  wasn't completed incorrectly and also missing information. Virginia Young state she will fax over their order form to have Dr. Percival Spanish fill out in order to ensure they receive a complete form. Fax number provided to rep.

## 2019-01-14 NOTE — Telephone Encounter (Signed)
Attempted to contact Kennis Carina at [facility]  [phone] to gather info on what documentation is needed, but number provided may be incorrect. Called pt caregiver Cruzita Lederer at [phone] and [facility]

## 2019-01-21 ENCOUNTER — Other Ambulatory Visit: Payer: Self-pay | Admitting: Family Medicine

## 2019-01-21 DIAGNOSIS — I480 Paroxysmal atrial fibrillation: Secondary | ICD-10-CM

## 2019-01-27 ENCOUNTER — Ambulatory Visit: Payer: Medicare Other | Admitting: Adult Health

## 2019-01-27 ENCOUNTER — Ambulatory Visit: Payer: Medicare Other | Admitting: Family Medicine

## 2019-01-27 NOTE — Telephone Encounter (Signed)
Fax paperwork back [employer]

## 2019-01-27 NOTE — Telephone Encounter (Signed)
Daria from [employer] called because she is needing pts portable O2 order needs to be updated she states that he form does not say that pt needs portable O2 @2LPM  continuous O2. She states that she will Fax over paperwork to be updated.  There is a box that says portable that needs to be checked and initialed and faxed back before pt can have her O2 started.

## 2019-01-28 NOTE — Telephone Encounter (Signed)
Follow Up   Daria calling from [place] stating that paperwork is incomplete. The box for portable gas needs to be dated beside the initials, office needs the diagnosis and the documentation showing the CHF, SOB, and all other facts. She states that they also need any test results to be faxed over as well. All this information is needed for it to be covered by insurance. Fax number [phone]   Daria # [phone]

## 2019-01-29 ENCOUNTER — Other Ambulatory Visit: Payer: Self-pay | Admitting: Neurology

## 2019-01-30 ENCOUNTER — Other Ambulatory Visit: Payer: Self-pay | Admitting: Family Medicine

## 2019-01-30 DIAGNOSIS — I480 Paroxysmal atrial fibrillation: Secondary | ICD-10-CM

## 2019-02-01 ENCOUNTER — Encounter: Payer: Self-pay | Admitting: Family Medicine

## 2019-02-01 ENCOUNTER — Telehealth: Payer: Self-pay

## 2019-02-01 ENCOUNTER — Other Ambulatory Visit: Payer: Self-pay

## 2019-02-01 ENCOUNTER — Ambulatory Visit: Payer: Medicare Other | Admitting: Family Medicine

## 2019-02-01 NOTE — Telephone Encounter (Signed)
Rakes. NTBS 30 days given 01/21/19 LOV 06/10/18

## 2019-02-01 NOTE — Telephone Encounter (Signed)
lmtcb-cb 8/10

## 2019-02-01 NOTE — Telephone Encounter (Signed)
Hold pending 02/01/2019 10:30 appt with Amy NP.

## 2019-02-01 NOTE — Telephone Encounter (Signed)
Patient was a no call/no show for their appointment today.   

## 2019-02-03 ENCOUNTER — Ambulatory Visit (INDEPENDENT_AMBULATORY_CARE_PROVIDER_SITE_OTHER): Payer: Medicare Other | Admitting: Family Medicine

## 2019-02-03 ENCOUNTER — Institutional Professional Consult (permissible substitution): Payer: Medicare Other | Admitting: Pulmonary Disease

## 2019-02-03 ENCOUNTER — Other Ambulatory Visit: Payer: Self-pay

## 2019-02-03 ENCOUNTER — Encounter: Payer: Self-pay | Admitting: Family Medicine

## 2019-02-03 VITALS — BP 145/100 | HR 87 | Temp 97.3°F | Ht 70.0 in | Wt 203.5 lb

## 2019-02-03 DIAGNOSIS — R569 Unspecified convulsions: Secondary | ICD-10-CM | POA: Diagnosis not present

## 2019-02-03 DIAGNOSIS — F0391 Unspecified dementia with behavioral disturbance: Secondary | ICD-10-CM | POA: Diagnosis not present
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
## 2019-02-03 NOTE — Patient Instructions (Signed)
Continue current treatment plan  I recommend no driving   Follow up in 6 months   Memory Compensation Strategies  1. Use "WARM" strategy.  W= write it down  A= associate it  R= repeat it  M= make a mental note  2.   You can keep a Social worker.  Use a 3-ring notebook with sections for the following: calendar, important names and phone numbers,  medications, doctors' names/phone numbers, lists/reminders, and a section to journal what you did  each day.   3.    Use a calendar to write appointments down.  4.    Write yourself a schedule for the day.  This can be placed on the calendar or in a separate section of the Memory Notebook.  Keeping a  regular schedule can help memory.  5.    Use medication organizer with sections for each day or morning/evening pills.  You may need help loading it  6.    Keep a basket, or pegboard by the door.  Place items that you need to take out with you in the basket or on the pegboard.  You may also want to  include a message board for reminders.  7.    Use sticky notes.  Place sticky notes with reminders in a place where the task is performed.  For example: " turn off the  stove" placed by the stove, "lock the door" placed on the door at eye level, " take your medications" on  the bathroom mirror or by the place where you normally take your medications.  8.    Use alarms/timers.  Use while cooking to remind yourself to check on food or as a reminder to take your medicine, or as a  reminder to make a call, or as a reminder to perform another task, etc. Dementia Caregiver Guide Dementia is a term used to describe a number of symptoms that affect memory and thinking. The most common symptoms include:  Memory loss.  Trouble with language and communication.  Trouble concentrating.  Poor judgment.  Problems with reasoning.  Child-like behavior and language.  Extreme anxiety.  Angry outbursts.  Wandering from home or public places.  Dementia usually gets worse slowly over time. In the early stages, people with dementia can stay independent and safe with some help. In later stages, they need help with daily tasks such as dressing, grooming, and using the bathroom. How to help the person with dementia cope Dementia can be frightening and confusing. Here are some tips to help the person with dementia cope with changes caused by the disease. General tips  Keep the person on track with his or her routine.  Try to identify areas where the person may need help.  Be supportive, patient, calm, and encouraging.  Gently remind the person that adjusting to changes takes time.  Help with the tasks that the person has asked for help with.  Keep the person involved in daily tasks and decisions as much as possible.  Encourage conversation, but try not to get frustrated or harried if the person struggles to find words or does not seem to appreciate your help. Communication tips  When the person is talking or seems frustrated, make eye contact and hold the person's hand.  Ask specific questions that need yes or no answers.  Use simple words, short sentences, and a calm voice. Only give one direction at a time.  When offering choices, limit them to just 1 or 2.  Avoid  correcting the person in a negative way.  If the person is struggling to find the right words, gently try to help him or her. How to recognize symptoms of stress Symptoms of stress in caregivers include:  Feeling frustrated or angry with the person with dementia.  Denying that the person has dementia or that his or her symptoms will not improve.  Feeling hopeless and unappreciated.  Difficulty sleeping.  Difficulty concentrating.  Feeling anxious, irritable, or depressed.  Developing stress-related health problems.  Feeling like you have too little time for your own life. Follow these instructions at home:   Make sure that you and the person you  are caring for: ? Get regular sleep. ? Exercise regularly. ? Eat regular, nutritious meals. ? Drink enough fluid to keep your urine clear or pale yellow. ? Take over-the-counter and prescription medicines only as told by your health care providers. ? Attend all scheduled health care appointments.  Join a support group with others who are caregivers.  Ask about respite care resources so that you can have a regular break from the stress of caregiving.  Look for signs of stress in yourself and in the person you are caring for. If you notice signs of stress, take steps to manage it.  Consider any safety risks and take steps to avoid them.  Organize medications in a pill box for each day of the week.  Create a plan to handle any legal or financial matters. Get legal or financial advice if needed.  Keep a calendar in a central location to remind the person of appointments or other activities. Tips for reducing the risk of injury  Keep floors clear of clutter. Remove rugs, magazine racks, and floor lamps.  Keep hallways well lit, especially at night.  Put a handrail and nonslip mat in the bathtub or shower.  Put childproof locks on cabinets that contain dangerous items, such as medicines, alcohol, guns, toxic cleaning items, sharp tools or utensils, matches, and lighters.  Put the locks in places where the person cannot see or reach them easily. This will help ensure that the person does not wander out of the house and get lost.  Be prepared for emergencies. Keep a list of emergency phone numbers and addresses in a convenient area.  Remove car keys and lock garage doors so that the person does not try to get in the car and drive.  Have the person wear a bracelet that tracks locations and identifies the person as having memory problems. This should be worn at all times for safety. Where to find support: Many individuals and organizations offer support. These include:  Support groups  for people with dementia and for caregivers.  Counselors or therapists.  Home health care services.  Adult day care centers. Where to find more information Alzheimer's Association: [employer] Contact a health care provider if:  The person's health is rapidly getting worse.  You are no longer able to care for the person.  Caring for the person is affecting your physical and emotional health.  The person threatens himself or herself, you, or anyone else. Summary  Dementia is a term used to describe a number of symptoms that affect memory and thinking.  Dementia usually gets worse slowly over time.  Take steps to reduce the person's risk of injury, and to plan for future care.  Caregivers need support, relief from caregiving, and time for their own lives. This information is not intended to replace advice given to you by  your health care provider. Make sure you discuss any questions you have with your health care provider. Document Released: 05/14/2016 Document Revised: 05/23/2017 Document Reviewed: 05/14/2016 Elsevier Patient Education  2020 [employer].

## 2019-02-10 ENCOUNTER — Other Ambulatory Visit: Payer: Self-pay | Admitting: *Deleted

## 2019-02-10 DIAGNOSIS — E559 Vitamin D deficiency, unspecified: Secondary | ICD-10-CM

## 2019-02-10 MED ORDER — CVS D3 25 MCG (1000 UT) PO CAPS: 1000.0000 [IU] | ORAL_CAPSULE | Freq: Every day | ORAL | 1 refills | Status: DC

## 2019-02-14 NOTE — Progress Notes (Signed)
Made any corrections needed, and agree with history, physical, neuro exam,assessment and plan as stated.     Maite Burlison, MD [facility]  

## 2019-02-16 ENCOUNTER — Ambulatory Visit (INDEPENDENT_AMBULATORY_CARE_PROVIDER_SITE_OTHER): Payer: Medicare Other | Admitting: Pulmonary Disease

## 2019-02-16 ENCOUNTER — Other Ambulatory Visit: Payer: Self-pay

## 2019-02-16 ENCOUNTER — Encounter: Payer: Self-pay | Admitting: Pulmonary Disease

## 2019-02-16 VITALS — BP 108/78 | HR 86 | Temp 97.8°F | Ht 70.0 in | Wt 199.2 lb

## 2019-02-16 DIAGNOSIS — R942 Abnormal results of pulmonary function studies: Secondary | ICD-10-CM

## 2019-02-16 DIAGNOSIS — R0600 Dyspnea, unspecified: Secondary | ICD-10-CM

## 2019-02-16 DIAGNOSIS — R0609 Other forms of dyspnea: Secondary | ICD-10-CM

## 2019-02-16 DIAGNOSIS — I4821 Permanent atrial fibrillation: Secondary | ICD-10-CM | POA: Diagnosis not present

## 2019-02-16 DIAGNOSIS — I5022 Chronic systolic (congestive) heart failure: Secondary | ICD-10-CM | POA: Diagnosis not present

## 2019-02-16 DIAGNOSIS — M419 Scoliosis, unspecified: Secondary | ICD-10-CM

## 2019-02-16 NOTE — Progress Notes (Signed)
Synopsis: Referred in August 2020 for dyspnea by Baruch Gouty, FNP  Subjective:   PATIENT ID: Virginia Young GENDER: female DOB: 15-Sep-1940, MRN: [id number]  Chief Complaint  Patient presents with  . Consult    Reports dyspnea for 1 month. Referrd by NP lawrence. She was prescribed oxygen but it never came. She does report indigestion.     This is a 78 year old female with nonischemic cardiomyopathy, mitral valve regurgitation, chronic systolic heart failure, atrial fibrillation, orthostatic hypotension, hypertension, dementia with behavioral disturbances presents to the office for evaluation of the dyspnea patient is followed by [facility] neurolog[facility] for her dementia and potential seizure-like activity.  She is currently on Seroquel 50 mg twice daily which helps her hallucinations.  She was started on Depakote 250 mg daily for concern of seizure-like activity.  She has a caregiver as well as 2 daughters and a son that live locally.  She is followed by Dr. Percival Spanish in cardiology she has a mildly reduced ejection fraction at 40%.  Currently maintained on losartan plus low-dose diuretic.  Of note she is a lifelong non-smoker  OV 02/16/2019: Patient seen for evaluation of the dyspnea and shortness of breath on exertion.  Today in the office we discussed her multifactorial reasons for development of this.  These include her nonischemic cardiomyopathy, valvular disease, atrial fibrillation, physical deconditioning as well as kyphoscoliotic thoracic cage.  She did have pulmonary function tests with reassuring spirometry as well as lung volumes.  Of note her DLCO was 50% predicted.  Today in the office she describes greater than 1 year of shortness of breath or feeling short winded when she is trying to walk around the home.  She does not get out much but she is able to ambulate in the home with a cane.  She has her caregiver [name] with her today in the office.  She receives support from her every  morning until 3 PM.  Oxygen was ordered from her cardiologist office however they were unable to get this approved.    Past Medical History:  Diagnosis Date  . Allergy   . Atrial fibrillation (HCC)    coumadin  . Cataract   . Chest pain 03/2012   a. Lex MV 10/13:  EF 64%, dist ant and apical defect sugg of soft tissue atten, no ischemia  . Chronic systolic heart failure ([place])   . Depression   . GERD (gastroesophageal reflux disease)   . HLD (hyperlipidemia)   . Mild mitral regurgitation   . MS (multiple sclerosis) ([place])   . NICM (nonischemic cardiomyopathy) ([place])    a. neg CLite in 2003;  b. EF 40-45% in past;   c.  Echo 12/11: EF 35-40%, mild MR, mild LAE, mild RAE, small pericardial effusion   . Osteoarthritis   . Osteoporosis   . Stasis ulcer ([place])   . Vaginal prolapse without uterine prolapse   . Varicose veins      Family History  Problem Relation Age of Onset  . Diabetes Father 25       diabetes and syncope  . Heart attack Father   . Heart attack Sister   . Heart attack Brother        all 5 brothers had MIs  . Stroke Brother   . Cancer Sister        bone  . Diabetes Sister   . Cancer Sister   . Diabetes Sister   . Heart attack Brother   . Dementia Mother   .  Dementia Brother   . CAD Other   . Diabetes Daughter   . Colon cancer Neg Hx   . Neuropathy Neg Hx      Past Surgical History:  Procedure Laterality Date  . ANTERIOR AND POSTERIOR VAGINAL REPAIR W/ SACROSPINOUS LIGAMENT SUSPENSION  2016   [facility]  . BLADDER SURGERY    . CARPAL TUNNEL RELEASE Right 08/22/2017   Procedure: CARPAL TUNNEL RELEASE;  Surgeon: [name], MD;  Location: [facility];  Service: Orthopedics;  Laterality: Right;  . cataract surgery Bilateral   . EYE SURGERY    . INGUINAL HERNIA REPAIR     right  . LIPOMA EXCISION     h/o removed from upper back x 2  . REVERSE SHOULDER ARTHROPLASTY Right 08/22/2017   Procedure: REVERSE SHOULDER ARTHROPLASTY;  Surgeon: [name], MD;  Location:  [facility];  Service: Orthopedics;  Laterality: Right;    Social History   Socioeconomic History  . Marital status: Divorced    Spouse name: Not on file  . Number of children: 3  . Years of education: 13  . Highest education level: Not on file  Occupational History  . Occupation: retired    Fish farm manager: RETIRED  Social Needs  . Financial resource strain: Not on file  . Food insecurity    Worry: Not on file    Inability: Not on file  . Transportation needs    Medical: Not on file    Non-medical: Not on file  Tobacco Use  . Smoking status: Never Smoker  . Smokeless tobacco: Never Used  Substance and Sexual Activity  . Alcohol use: No  . Drug use: No  . Sexual activity: Not Currently  Lifestyle  . Physical activity    Days per week: Not on file    Minutes per session: Not on file  . Stress: Not on file  Relationships  . Social Herbalist on phone: Not on file    Gets together: Not on file    Attends religious service: Not on file    Active member of club or organization: Not on file    Attends meetings of clubs or organizations: Not on file    Relationship status: Not on file  . Intimate partner violence    Fear of current or ex partner: Not on file    Emotionally abused: Not on file    Physically abused: Not on file    Forced sexual activity: Not on file  Other Topics Concern  . Not on file  Social History Narrative   DESIGNATED PARTY RELEASE ON FILE. Fleet Contras, April 26, 2010 10:09 AM.      Lives at home alone. She has a caregiver Monday through Friday during the day   Right-handed   Caffeine: 1-2 cup of coffee daily     Allergies  Allergen Reactions  . Bactrim [Sulfamethoxazole-Trimethoprim]     Mouth sores  . Lipitor [Atorvastatin Calcium]     myalgia  . Lisinopril     cough     Outpatient Medications Prior to Visit  Medication Sig Dispense Refill  . CVS D3 25 MCG (1000 UT) capsule Take 1 capsule (1,000 Units total) by mouth daily. 90  capsule 1  . divalproex (DEPAKOTE ER) 250 MG 24 hr tablet Take 1 tablet (250 mg total) by mouth daily. Appointment needed for further refills. 30 tablet 0  . ELIQUIS 5 MG TABS tablet Take 1 tablet (5 mg total) by mouth every 12 (twelve)  hours. (Please make 6 mos appt Aug) 60 tablet 0  . furosemide (LASIX) 20 MG tablet Take 60 mg by mouth.    Marland Kitchen LINZESS 145 MCG CAPS capsule TAKE 1 CAPSULE (145 MCG TOTAL) BY MOUTH DAILY BEFORE BREAKFAST. 90 capsule 0  . losartan (COZAAR) 25 MG tablet Take 12.5 mg by mouth daily.    . Multiple Vitamin (MULTIVITAMIN) tablet Take 1 tablet by mouth daily.    . QUEtiapine (SEROQUEL) 50 MG tablet TAKE 1 TABLET BY MOUTH TWICE A DAY 180 tablet 4   No facility-administered medications prior to visit.     Review of Systems  Constitutional: Negative for chills, fever, malaise/fatigue and weight loss.  HENT: Negative for hearing loss, sore throat and tinnitus.   Eyes: Negative for blurred vision and double vision.  Respiratory: Positive for shortness of breath. Negative for cough, hemoptysis, sputum production, wheezing and stridor.   Cardiovascular: Positive for palpitations and leg swelling. Negative for chest pain, orthopnea and PND.  Gastrointestinal: Negative for abdominal pain, constipation, diarrhea, heartburn, nausea and vomiting.  Genitourinary: Negative for dysuria, hematuria and urgency.  Musculoskeletal: Negative for joint pain and myalgias.  Skin: Negative for itching and rash.  Neurological: Negative for dizziness, tingling, weakness and headaches.  Endo/Heme/Allergies: Negative for environmental allergies. Does not bruise/bleed easily.  Psychiatric/Behavioral: Negative for depression. The patient is not nervous/anxious and does not have insomnia.   All other systems reviewed and are negative.    Objective:  Physical Exam Vitals signs reviewed.  Constitutional:      General: She is not in acute distress.    Appearance: She is well-developed.  HENT:      Head: Normocephalic and atraumatic.  Eyes:     General: No scleral icterus.    Conjunctiva/sclera: Conjunctivae normal.     Pupils: Pupils are equal, round, and reactive to light.  Neck:     Musculoskeletal: Neck supple.     Vascular: No JVD.     Trachea: No tracheal deviation.  Cardiovascular:     Rate and Rhythm: Normal rate. Rhythm irregular.     Heart sounds: Normal heart sounds. No murmur.  Pulmonary:     Effort: Pulmonary effort is normal. No tachypnea, accessory muscle usage or respiratory distress.     Breath sounds: No stridor. No wheezing, rhonchi or rales.     Comments: No crackles Abdominal:     General: Bowel sounds are normal. There is no distension.     Palpations: Abdomen is soft.     Tenderness: There is no abdominal tenderness.  Musculoskeletal:        General: No tenderness.     Right lower leg: Edema present.     Left lower leg: Edema present.     Comments: Severe kyphoscoliosis of the thoracic spine  Hyperpigmentation of the bilateral lower extremities and dilated vein.  Evidence of chronic venous stasis  Lymphadenopathy:     Cervical: No cervical adenopathy.  Skin:    General: Skin is warm and dry.     Capillary Refill: Capillary refill takes less than 2 seconds.     Findings: No rash.  Neurological:     Mental Status: She is alert and oriented to person, place, and time.  Psychiatric:        Behavior: Behavior normal.      Vitals:   02/16/19 1034  BP: 108/78  Pulse: 86  Temp: 97.8 F (36.6 C)  TempSrc: Temporal  SpO2: 98%  Weight: 199 lb 3.2 oz (90.4  kg)  Height: _0  (1.778 m)   98% on RA BMI Readings from Last 3 Encounters:  02/16/19 28.58 kg/m  02/03/19 29.20 kg/m  01/08/19 28.52 kg/m   Wt Readings from Last 3 Encounters:  02/16/19 199 lb 3.2 oz (90.4 kg)  02/03/19 203 lb 8 oz (92.3 kg)  01/08/19 198 lb 12.8 oz (90.2 kg)     CBC    Component Value Date/Time   WBC 10.7 08/05/2018 1444   WBC 8.1 08/18/2017 1022    RBC 4.96 08/05/2018 1444   RBC 4.95 08/18/2017 1022   HGB 14.1 08/05/2018 1444   HCT 42.5 08/05/2018 1444   PLT 237 08/05/2018 1444   MCV 86 08/05/2018 1444   MCH 28.4 08/05/2018 1444   MCH 28.9 08/18/2017 1022   MCHC 33.2 08/05/2018 1444   MCHC 32.8 08/18/2017 1022   RDW 13.3 08/05/2018 1444   LYMPHSABS 2.4 02/02/2018 1543   MONOABS 0.6 07/25/2016 1226   EOSABS 0.3 02/02/2018 1543   BASOSABS 0.1 02/02/2018 1543     Chest Imaging: Chest x-ray February 2020: No acute abnormality.  Kyphoscoliotic thoracic spine The patient's images have been independently reviewed by me.    Pulmonary Functions Testing Results: PFT Results Latest Ref Rng & Units 09/09/2018 06/18/2016  FVC-Pre L 2.54 2.80  FVC-Predicted Pre % 76 82  FVC-Post L 2.61 2.86  FVC-Predicted Post % 78 84  Pre FEV1/FVC % % 82 79  Post FEV1/FCV % % 83 80  FEV1-Pre L 2.09 2.20  FEV1-Predicted Pre % 83 86  FEV1-Post L 2.17 2.29  DLCO UNC% % 50 43  DLCO COR %Predicted % 70 58  TLC L 4.66 4.60  TLC % Predicted % 80 79  RV % Predicted % 84 77    FeNO: None   Pathology: None   Echocardiogram:  February 2020: Ejection fraction 25 to 30%  Heart Catheterization: None recent     Assessment & Plan:     ICD-10-CM   1. DOE (dyspnea on exertion)  R06.09   2. Chronic systolic HF (heart failure) (HCC)  I50.22   3. Kyphoscoliosis  M41.9   4. Permanent atrial fibrillation  I48.21   5. Decreased diffusion capacity  R94.2     Discussion:  This is a 78 year old female with complaints of dyspnea on exertion.  I believe that she has multifactorial etiology for this dyspnea.  This includes her chronic systolic heart failure, nonischemic cardiomyopathy, mitral regurgitation, atrial fibrillation.  Her pulmonary function tests are reassuring except for a reduced DLCO.  She has no significant abnormality or parenchymal disease identified on chest x-ray however this does not exclude an underlying ILD.  Either way at her current  age and comorbidities investigating a potential underlying ILD for a mildly reduced DLCO which can be explained by her systolic heart failure and potential for pulmonary vascular disease and pulmonary hypertension would make more sense.  Additionally she has restrictive lung physiology from her kyphoscoliosis.  Kyphoscoliosis is also a known cause of mildly reduced DLCO even though her TLC was within normal range.   I encouraged her to continue physical activity as much as tolerated. Continue medications as prescribed by cardiology for management of heart failure. We will also walk her today in the office to see if she meets criteria for oxygen supplementation. Please see alk documentation   Patient follow-up with [place] in 6 months or as needed.  Greater than 50% of this patient's 45-minute office visit was spent face-to-face discussing the  above recommendations and treatment plan.  We also reviewed patient's pulmonary function test and x-ray imaging.    Current Outpatient Medications:  .  CVS D3 25 MCG (1000 UT) capsule, Take 1 capsule (1,000 Units total) by mouth daily., Disp: 90 capsule, Rfl: 1 .  divalproex (DEPAKOTE ER) 250 MG 24 hr tablet, Take 1 tablet (250 mg total) by mouth daily. Appointment needed for further refills., Disp: 30 tablet, Rfl: 0 .  ELIQUIS 5 MG TABS tablet, Take 1 tablet (5 mg total) by mouth every 12 (twelve) hours. (Please make 6 mos appt Aug), Disp: 60 tablet, Rfl: 0 .  furosemide (LASIX) 20 MG tablet, Take 60 mg by mouth., Disp: , Rfl:  .  LINZESS 145 MCG CAPS capsule, TAKE 1 CAPSULE (145 MCG TOTAL) BY MOUTH DAILY BEFORE BREAKFAST., Disp: 90 capsule, Rfl: 0 .  losartan (COZAAR) 25 MG tablet, Take 12.5 mg by mouth daily., Disp: , Rfl:  .  Multiple Vitamin (MULTIVITAMIN) tablet, Take 1 tablet by mouth daily., Disp: , Rfl:  .  QUEtiapine (SEROQUEL) 50 MG tablet, TAKE 1 TABLET BY MOUTH TWICE A DAY, Disp: 180 tablet, Rfl: 4   Garner Nash, DO  Pulmonary  Critical Care 02/16/2019 10:59 AM

## 2019-02-16 NOTE — Patient Instructions (Addendum)
Thank you for visiting Dr. Valeta Harms at [facility]. Today we recommend the following:  Continue to increase you exercise tolerance as able.  Return in about 1 year (around 02/16/2020), or if symptoms worsen or fail to improve.   Call with any changes or concerns.    Please do your part to reduce the spread of COVID-19.

## 2019-02-17 ENCOUNTER — Encounter: Payer: Self-pay | Admitting: Family Medicine

## 2019-02-17 ENCOUNTER — Ambulatory Visit (INDEPENDENT_AMBULATORY_CARE_PROVIDER_SITE_OTHER): Payer: Medicare Other | Admitting: Family Medicine

## 2019-02-17 VITALS — BP 145/96 | HR 79 | Temp 96.8°F | Ht 70.0 in | Wt 199.0 lb

## 2019-02-17 DIAGNOSIS — I10 Essential (primary) hypertension: Secondary | ICD-10-CM | POA: Diagnosis not present

## 2019-02-17 DIAGNOSIS — E559 Vitamin D deficiency, unspecified: Secondary | ICD-10-CM | POA: Diagnosis not present

## 2019-02-17 DIAGNOSIS — I480 Paroxysmal atrial fibrillation: Secondary | ICD-10-CM

## 2019-02-17 DIAGNOSIS — F0391 Unspecified dementia with behavioral disturbance: Secondary | ICD-10-CM | POA: Diagnosis not present

## 2019-02-17 DIAGNOSIS — E782 Mixed hyperlipidemia: Secondary | ICD-10-CM

## 2019-02-17 DIAGNOSIS — K5900 Constipation, unspecified: Secondary | ICD-10-CM

## 2019-02-17 MED ORDER — LINACLOTIDE 145 MCG PO CAPS: ORAL_CAPSULE | ORAL | 0 refills | Status: DC

## 2019-02-17 MED ORDER — ELIQUIS 5 MG PO TABS: 5.0000 mg | ORAL_TABLET | Freq: Two times a day (BID) | ORAL | 0 refills | Status: DC

## 2019-02-17 NOTE — Patient Instructions (Signed)
The 4 Principles of Healthy Living  1. Do Not Smoke.  2. Maintain a BMI<30.  3. Exercise 150 minutes/week. (30 minutes 5 days a week of some form of aerobic exercise)  4. Eat 5 servings of fruits or vegetables daily.   Several studies have conclusively shown that individuals who do these things have a dramatic reduction in overall mortality, heart disease, diabetes, hypertension, stroke, congestive heart failure, and cancer. This means that without taking any pills, vitamins, tonics, etc - without spending a single penny - you can live a longer, healthier, more productive life. Let's look at these 4 principles separately.   1. Do Not Smoke - Make up your mind to quit, talk with your doctor to formulate a plan, and set a date.  2. Get to and maintain a BMI<30. This gets you out of the obese category. No longer being obese will dramatically reduce you and your family's risk of a multitude of health problems. It is not easy, but, it is possible. If you are extremely obese it may take years, but, each step you take will lead to dramatic rewards. With just 20 pounds of weight loss most people feel better, have less fatigue and joint pain, and feel more energetic. If you have health problems like diabetes, hypertension, or high cholesterol you might do away with your need for some medications. And most of all, while you change you lifestyle to attain this goal you will set a good example for all those around you, especially your children. Here are two proven steps to help you start losing weight. ? Portion Control - Eating your meals on a smaller plate and limiting the amount of calories you eat at each meal has been shown to lead to weight loss. The average plate is 10 inches in diameter. A 10 inch plate piled high with food can add up to 1500 calories (even more if you go back for seconds). The typical man needs 2000 calories per day total. Try using a smaller plate (8 inch paper plate or 7 inch saucer)  at each meal and NEVER go back for second helpings. ? Pedometer - individuals who wear a pedometer and try to walk 10,000 steps each day increase their physical activity, lose weight, and decrease their blood pressures.  3. Exercise 150 minutes/week. The overall health benefits of regular aerobic exercise are overwhelming:  Reduces the risk of dying prematurely. Reduces the risk of dying from heart disease.  Reduces the risk of stroke.  Reduces the risk of developing diabetes.  Reduces the risk of developing high blood pressure.  Helps reduce blood pressure in people who already have high blood pressure.  Reduces the risk of developing colon cancer.  Reduces feelings of depression and anxiety.  Helps control weight.  Helps build and maintain healthy bones, muscles and joints.  Helps older adults become stronger and better able to move about without falling.  Promotes psychological well-being.  If you have health problems or are over age 60 we recommend consulting your doctor before beginning. We recommend starting slow and working up. Start by reading the [employer] on Starting an Exercise Program then get going. Do not over think the process. Pick something simple at home like walking with friends, using a treadmill, or riding an exercise bike. Experiment with different types of exercise until you find something you can tolerate (it does not have to be fun). Pick a 30 minute disc of inspirational music and listen to it while you exercise.   The more you do it the easier it will become and the better you will feel.  4. Eat 5 servings of fruits or vegetables daily.  A serving size is: One medium-size fruit  1/2 cup raw, cooked, frozen or canned fruits (in 100% juice) or vegetables  3/4 cup (6 oz.) 100% fruit or vegetable juice  1 cup raw, leafy vegetables  1/4 cup dried fruit   While this sounds easy enough actually getting this much fruits and vegetables takes some work and planning. You  will need to experiment with different types of fruits and vegetables to find ones you and your family can eat every day. Some simple tips include:  - Add fruit to your cereal each morning. - Eat a salad each day for lunch. The typical bowl of salad counts for 2 servings of vegetables.  - Have some fruits and vegetables at every meal. Use canned or frozen products if needed. - Eat fruits and vegetables for snacks - especially for the kids. - Replace the side of fries or chips with a cup of fruit, an apple, or a bowl of celery.  What are the benefits? Reduces heart disease and stroke. Possible reduction in cancer risk. Protects against the development of diabetes. Filling up on fat free fruits and vegetable decreases the amount of high fat foods you will eat, aiding in weight loss.  

## 2019-02-17 NOTE — Progress Notes (Signed)
Subjective:  Patient ID: Virginia Young, female    DOB: Feb 08, 1941, 78 y.o.   MRN: [id number]  Patient Care Team: Baruch Gouty, FNP as PCP - General (Family Medicine) Minus Breeding, MD as PCP - Cardiology (Cardiology) Fay Records, MD (Cardiology)   Chief Complaint:  Medical Management of Chronic Issues (refills)   HPI: Virginia Young is a 78 y.o. female presenting on 02/17/2019 for Medical Management of Chronic Issues (refills)  Pt with caregiver today who assists with HPI and ROS.  1. Mixed hyperlipidemia  Diet controlled. Statin intolerance.  Diet - generally well balanced Exercise - active, no exercise routine  Lab Results  Component Value Date   CHOL 196 05/15/2015   HDL 73 05/15/2015   LDLCALC 108 (H) 05/15/2015   TRIG 74 05/15/2015   CHOLHDL 2.7 05/15/2015     Family and personal medical history reviewed. Smoking and ETOH history reviewed.    2. Vitamin D deficiency  Pt is taking oral repletion therapy. Denies bone pain and tenderness, muscle weakness, fracture, and difficulty walking. Lab Results  Component Value Date   VD25OH 49 06/10/2013   VD25OH 40 06/21/2011   Lab Results  Component Value Date   CALCIUM 9.1 01/08/2019      3. Essential hypertension  Complaint with meds - Yes Current Medications - losartan, lasix Checking BP at home ranging 120/70 max per caregiver  Exercising Regularly - No Watching Salt intake - Yes Pertinent ROS:  Headache - No Fatigue - No Visual Disturbances - No Chest pain - No Dyspnea - No Palpitations - No LE edema - No They report good compliance with medications and can restate their regimen by memory. No medication side effects.  Family, social, and smoking history reviewed.   BP Readings from Last 3 Encounters:  02/17/19 (!) 145/96  02/16/19 108/78  02/03/19 (!) 145/100   CMP Latest Ref Rng & Units 01/08/2019 11/04/2018 06/10/2018  Glucose 65 - 99 mg/dL 63(L) 63(L) 124(H)  BUN 8 - 27 mg/dL 16  20 25   Creatinine 0.57 - 1.00 mg/dL 1.03(H) 1.12(H) 1.25(H)  Sodium 134 - 144 mmol/L 137 140 142  Potassium 3.5 - 5.2 mmol/L 4.3 4.1 4.3  Chloride 96 - 106 mmol/L 95(L) 99 103  CO2 20 - 29 mmol/L 28 27 24   Calcium 8.7 - 10.3 mg/dL 9.1 9.2 9.3  Total Protein 6.0 - 8.5 g/dL - - -  Total Bilirubin 0.0 - 1.2 mg/dL - - -  Alkaline Phos 39 - 117 IU/L - - -  AST 0 - 40 IU/L - - -  ALT 0 - 32 IU/L - - -      4. Paroxysmal atrial fibrillation (HCC)  Pt denies palpitations, chest pain, shortness of breath, dizziness, fatigue, or syncope. On Eliquis and tolerating well. Followed by cardiology, visit on 01/08/2019. Did have some DOE in office with a noted drop in room air oxygen saturation. Followed up with pulmonology for evaluation and oxygen saturations were normal.    5. Constipation, unspecified constipation type  Takes Linzess when needed. Usually about 2-3 times per week and does well with this. No hematochezia or melena. No abdominal pain, nausea, or vomiting.    6. Dementia with behavioral disturbance, unspecified dementia type ([place])  Pt doing well, no decline in mental status per caregiver. Pt is able to perform most ADLs. She can still cook, clean, and take a shower without assistance. Does require help with medications and executive tasks.  Relevant past medical, surgical, family, and social history reviewed and updated as indicated.  Allergies and medications reviewed and updated. Date reviewed: Chart in Epic.   Past Medical History:  Diagnosis Date  . Allergy   . Atrial fibrillation (HCC)    coumadin  . Cataract   . Chest pain 03/2012   a. Lex MV 10/13:  EF 64%, dist ant and apical defect sugg of soft tissue atten, no ischemia  . Chronic systolic heart failure ([place])   . Depression   . GERD (gastroesophageal reflux disease)   . HLD (hyperlipidemia)   . Mild mitral regurgitation   . MS (multiple sclerosis) ([place])   . NICM (nonischemic cardiomyopathy) ([place])    a. neg  CLite in 2003;  b. EF 40-45% in past;   c.  Echo 12/11: EF 35-40%, mild MR, mild LAE, mild RAE, small pericardial effusion   . Osteoarthritis   . Osteoporosis   . Stasis ulcer ([place])   . Vaginal prolapse without uterine prolapse   . Varicose veins     Past Surgical History:  Procedure Laterality Date  . ANTERIOR AND POSTERIOR VAGINAL REPAIR W/ SACROSPINOUS LIGAMENT SUSPENSION  2016   [facility]  . BLADDER SURGERY    . CARPAL TUNNEL RELEASE Right 08/22/2017   Procedure: CARPAL TUNNEL RELEASE;  Surgeon: [name], MD;  Location: [facility];  Service: Orthopedics;  Laterality: Right;  . cataract surgery Bilateral   . EYE SURGERY    . INGUINAL HERNIA REPAIR     right  . LIPOMA EXCISION     h/o removed from upper back x 2  . REVERSE SHOULDER ARTHROPLASTY Right 08/22/2017   Procedure: REVERSE SHOULDER ARTHROPLASTY;  Surgeon: [name], MD;  Location: [facility];  Service: Orthopedics;  Laterality: Right;    Social History   Socioeconomic History  . Marital status: Divorced    Spouse name: Not on file  . Number of children: 3  . Years of education: 45  . Highest education level: Not on file  Occupational History  . Occupation: retired    Fish farm manager: RETIRED  Social Needs  . Financial resource strain: Not on file  . Food insecurity    Worry: Not on file    Inability: Not on file  . Transportation needs    Medical: Not on file    Non-medical: Not on file  Tobacco Use  . Smoking status: Never Smoker  . Smokeless tobacco: Never Used  Substance and Sexual Activity  . Alcohol use: No  . Drug use: No  . Sexual activity: Not Currently  Lifestyle  . Physical activity    Days per week: Not on file    Minutes per session: Not on file  . Stress: Not on file  Relationships  . Social Herbalist on phone: Not on file    Gets together: Not on file    Attends religious service: Not on file    Active member of club or organization: Not on file    Attends meetings of clubs or  organizations: Not on file    Relationship status: Not on file  . Intimate partner violence    Fear of current or ex partner: Not on file    Emotionally abused: Not on file    Physically abused: Not on file    Forced sexual activity: Not on file  Other Topics Concern  . Not on file  Social History Narrative   DESIGNATED PARTY RELEASE ON FILE. LATOYA BATTLE,  April 26, 2010 10:09 AM.      Lives at home alone. She has a caregiver Monday through Friday during the day   Right-handed   Caffeine: 1-2 cup of coffee daily    Outpatient Encounter Medications as of 02/17/2019  Medication Sig  . [facility] D3 25 MCG (1000 UT) capsule Take 1 capsule (1,000 Units total) by mouth daily.  . divalproex (DEPAKOTE ER) 250 MG 24 hr tablet Take 1 tablet (250 mg total) by mouth daily. Appointment needed for further refills.  Marland Kitchen ELIQUIS 5 MG TABS tablet Take 1 tablet (5 mg total) by mouth every 12 (twelve) hours. (Please make 6 mos appt Aug)  . furosemide (LASIX) 20 MG tablet Take 60 mg by mouth.  . linaclotide (LINZESS) 145 MCG CAPS capsule TAKE 1 CAPSULE (145 MCG TOTAL) BY MOUTH DAILY BEFORE BREAKFAST.  Marland Kitchen losartan (COZAAR) 25 MG tablet Take 12.5 mg by mouth daily.  . Multiple Vitamin (MULTIVITAMIN) tablet Take 1 tablet by mouth daily.  . QUEtiapine (SEROQUEL) 50 MG tablet TAKE 1 TABLET BY MOUTH TWICE A DAY  . [DISCONTINUED] ELIQUIS 5 MG TABS tablet Take 1 tablet (5 mg total) by mouth every 12 (twelve) hours. (Please make 6 mos appt Aug)  . [DISCONTINUED] LINZESS 145 MCG CAPS capsule TAKE 1 CAPSULE (145 MCG TOTAL) BY MOUTH DAILY BEFORE BREAKFAST.   No facility-administered encounter medications on file as of 02/17/2019.     Allergies  Allergen Reactions  . Bactrim [Sulfamethoxazole-Trimethoprim]     Mouth sores  . Lipitor [Atorvastatin Calcium]     myalgia  . Lisinopril     cough    Review of Systems  Constitutional: Negative for activity change, appetite change, chills, diaphoresis, fatigue, fever and  unexpected weight change.  HENT: Negative.   Eyes: Negative.  Negative for photophobia and visual disturbance.  Respiratory: Negative for cough, chest tightness and shortness of breath.   Cardiovascular: Negative for chest pain, palpitations and leg swelling.  Gastrointestinal: Negative for abdominal distention, abdominal pain, anal bleeding, blood in stool, constipation, diarrhea, nausea, rectal pain and vomiting.  Endocrine: Negative.  Negative for cold intolerance, heat intolerance, polydipsia, polyphagia and polyuria.  Genitourinary: Negative for decreased urine volume, difficulty urinating, dysuria, frequency and urgency.  Musculoskeletal: Negative for arthralgias, back pain, gait problem and myalgias.  Skin: Negative.  Negative for color change and pallor.  Allergic/Immunologic: Negative.   Neurological: Negative for dizziness, tremors, seizures, syncope, facial asymmetry, speech difficulty, weakness, light-headedness, numbness and headaches.  Hematological: Negative.  Negative for adenopathy. Does not bruise/bleed easily.  Psychiatric/Behavioral: Positive for agitation, behavioral problems and confusion. Negative for decreased concentration, hallucinations, self-injury, sleep disturbance and suicidal ideas.  All other systems reviewed and are negative.       Objective:  BP (!) 145/96   Pulse 79   Temp (!) 96.8 F (36 C)   Ht 5\' 10"  (1.778 m)   Wt 199 lb (90.3 kg)   BMI 28.55 kg/m    Wt Readings from Last 3 Encounters:  02/17/19 199 lb (90.3 kg)  02/16/19 199 lb 3.2 oz (90.4 kg)  02/03/19 203 lb 8 oz (92.3 kg)    Physical Exam Vitals signs and nursing note reviewed.  Constitutional:      General: She is not in acute distress.    Appearance: Normal appearance. She is well-developed, well-groomed and overweight. She is not ill-appearing, toxic-appearing or diaphoretic.  HENT:     Head: Normocephalic and atraumatic.     Jaw: There is normal jaw  occlusion.     Right Ear:  Hearing normal.     Left Ear: Hearing normal.     Nose: Nose normal.     Mouth/Throat:     Lips: Pink.     Mouth: Mucous membranes are moist.     Pharynx: Oropharynx is clear. Uvula midline.  Eyes:     General: Lids are normal.     Extraocular Movements: Extraocular movements intact.     Conjunctiva/sclera: Conjunctivae normal.     Pupils: Pupils are equal, round, and reactive to light.  Neck:     Musculoskeletal: Normal range of motion and neck supple.     Thyroid: No thyroid mass, thyromegaly or thyroid tenderness.     Vascular: No carotid bruit or JVD.     Trachea: Trachea and phonation normal.  Cardiovascular:     Rate and Rhythm: Normal rate and regular rhythm.     Chest Wall: PMI is not displaced.     Pulses: Normal pulses.     Heart sounds: Normal heart sounds. No murmur. No friction rub. No gallop. No S3 or S4 sounds.   Pulmonary:     Effort: Pulmonary effort is normal. No respiratory distress.     Breath sounds: Normal breath sounds. No wheezing, rhonchi or rales.  Abdominal:     General: Bowel sounds are normal. There is no distension or abdominal bruit.     Palpations: Abdomen is soft. There is no hepatomegaly or splenomegaly.     Tenderness: There is no abdominal tenderness. There is no right CVA tenderness or left CVA tenderness.     Hernia: No hernia is present.  Musculoskeletal: Normal range of motion.     Right lower leg: 1+ Edema present.     Left lower leg: 1+ Edema present.  Lymphadenopathy:     Cervical: No cervical adenopathy.  Skin:    General: Skin is warm and dry.     Capillary Refill: Capillary refill takes less than 2 seconds.     Coloration: Skin is not cyanotic, jaundiced or pale.     Findings: No rash.  Neurological:     General: No focal deficit present.     Mental Status: She is alert. Mental status is at baseline.     Cranial Nerves: Cranial nerves are intact. No cranial nerve deficit.     Sensory: Sensation is intact.     Motor: Motor  function is intact. No weakness.     Coordination: Coordination is intact. Coordination normal.     Gait: Gait is intact. Gait normal.     Deep Tendon Reflexes: Reflexes are normal and symmetric. Reflexes normal.  Psychiatric:        Attention and Perception: Attention and perception normal.        Mood and Affect: Mood and affect normal.        Speech: Speech normal.        Behavior: Behavior normal. Behavior is cooperative.        Thought Content: Thought content normal.        Cognition and Memory: Cognition and memory normal.        Judgment: Judgment normal.     Results for orders placed or performed in visit on [id number]  Basic metabolic panel  Result Value Ref Range   Glucose 63 (L) 65 - 99 mg/dL   BUN 16 8 - 27 mg/dL   Creatinine, Ser 1.03 (H) 0.57 - 1.00 mg/dL   GFR calc non Af Amer 52 (  L) >59 mL/min/1.73   GFR calc Af Amer 60 >59 mL/min/1.73   BUN/Creatinine Ratio 16 12 - 28   Sodium 137 134 - 144 mmol/L   Potassium 4.3 3.5 - 5.2 mmol/L   Chloride 95 (L) 96 - 106 mmol/L   CO2 28 20 - 29 mmol/L   Calcium 9.1 8.7 - 10.3 mg/dL       Pertinent labs & imaging results that were available during my care of the patient were reviewed by me and considered in my medical decision making.  Assessment & Plan:  Virginia Young was seen today for medical management of chronic issues.  Diagnoses and all orders for this visit:  Mixed hyperlipidemia Diet and exercise encouraged. Continue medications as prescribed. Will recheck lipids at next appointment.   Vitamin D deficiency Continue repletion therapy. Will check levels at next appointment.   Essential hypertension DASH diet and exercise encouraged. Well controlled at home. Continue medications as prescribed. Report any persistent highs or lows. Follow up in 6 months for reevaluation and labs.   Paroxysmal atrial fibrillation (HCC) Well controlled. Compliant with medications. No abnormal bleeding or bruising. Continue medications as  prescribed and follow up with cardiology as scheduled.  -     ELIQUIS 5 MG TABS tablet; Take 1 tablet (5 mg total) by mouth every 12 (twelve) hours. (Please make 6 mos appt Aug)  IBS constipation type Takes Linzess 2-3 times per week as needed for constipation. Will refill today.  -     linaclotide (LINZESS) 145 MCG CAPS capsule; TAKE 1 CAPSULE (145 MCG TOTAL) BY MOUTH DAILY BEFORE BREAKFAST.     Continue all other maintenance medications.  Follow up plan: Return in about 6 months (around 08/20/2019), or if symptoms worsen or fail to improve.  Continue healthy lifestyle choices, including diet (rich in fruits, vegetables, and lean proteins, and low in salt and simple carbohydrates) and exercise (at least 30 minutes of moderate physical activity daily).  Educational handout given for healthy living   The above assessment and management plan was discussed with the patient. The patient verbalized understanding of and has agreed to the management plan. Patient is aware to call the clinic if symptoms persist or worsen. Patient is aware when to return to the clinic for a follow-up visit. Patient educated on when it is appropriate to go to the emergency department.   Monia Pouch, FNP-C [facility] [phone]

## 2019-02-27 ENCOUNTER — Other Ambulatory Visit: Payer: Self-pay | Admitting: Neurology

## 2019-03-20 ENCOUNTER — Other Ambulatory Visit: Payer: Self-pay | Admitting: Family Medicine

## 2019-03-20 DIAGNOSIS — I480 Paroxysmal atrial fibrillation: Secondary | ICD-10-CM

## 2019-03-28 ENCOUNTER — Other Ambulatory Visit: Payer: Self-pay | Admitting: Neurology

## 2019-04-02 ENCOUNTER — Ambulatory Visit (INDEPENDENT_AMBULATORY_CARE_PROVIDER_SITE_OTHER): Payer: Medicare Other | Admitting: Family Medicine

## 2019-04-02 ENCOUNTER — Encounter: Payer: Self-pay | Admitting: Family Medicine

## 2019-04-02 ENCOUNTER — Other Ambulatory Visit: Payer: Self-pay

## 2019-04-02 DIAGNOSIS — R3989 Other symptoms and signs involving the genitourinary system: Secondary | ICD-10-CM | POA: Diagnosis not present

## 2019-04-02 LAB — MICROSCOPIC EXAMINATION: WBC, UA: >30 /hpf — AB (ref 0–5)

## 2019-04-02 LAB — URINALYSIS, COMPLETE
Bilirubin, UA: NEGATIVE
Glucose, UA: NEGATIVE
Ketones, UA: NEGATIVE
Nitrite, UA: NEGATIVE
Specific Gravity, UA: 1.02 (ref 1.005–1.030)
Urobilinogen, Ur: 0.2 mg/dL (ref 0.2–1.0)
pH, UA: 7 (ref 5.0–7.5)

## 2019-04-02 MED ORDER — CEFDINIR 300 MG PO CAPS: 300.0000 mg | ORAL_CAPSULE | Freq: Two times a day (BID) | ORAL | 0 refills | Status: AC

## 2019-04-02 NOTE — Patient Instructions (Signed)
Urinary Tract Infection, Adult A urinary tract infection (UTI) is an infection of any part of the urinary tract. The urinary tract includes:  The kidneys.  The ureters.  The bladder.  The urethra. These organs make, store, and get rid of pee (urine) in the body. What are the causes? This is caused by germs (bacteria) in your genital area. These germs grow and cause swelling (inflammation) of your urinary tract. What increases the risk? You are more likely to develop this condition if:  You have a small, thin tube (catheter) to drain pee.  You cannot control when you pee or poop (incontinence).  You are female, and: ? You use these methods to prevent pregnancy: ? A medicine that kills sperm (spermicide). ? A device that blocks sperm (diaphragm). ? You have low levels of a female hormone (estrogen). ? You are pregnant.  You have genes that add to your risk.  You are sexually active.  You take antibiotic medicines.  You have trouble peeing because of: ? A prostate that is bigger than normal, if you are female. ? A blockage in the part of your body that drains pee from the bladder (urethra). ? A kidney stone. ? A nerve condition that affects your bladder (neurogenic bladder). ? Not getting enough to drink. ? Not peeing often enough.  You have other conditions, such as: ? Diabetes. ? A weak disease-fighting system (immune system). ? Sickle cell disease. ? Gout. ? Injury of the spine. What are the signs or symptoms? Symptoms of this condition include:  Needing to pee right away (urgently).  Peeing often.  Peeing small amounts often.  Pain or burning when peeing.  Blood in the pee.  Pee that smells bad or not like normal.  Trouble peeing.  Pee that is cloudy.  Fluid coming from the vagina, if you are female.  Pain in the belly or lower back. Other symptoms include:  Throwing up (vomiting).  No urge to eat.  Feeling mixed up (confused).  Being tired  and grouchy (irritable).  A fever.  Watery poop (diarrhea). How is this treated? This condition may be treated with:  Antibiotic medicine.  Other medicines.  Drinking enough water. Follow these instructions at home:  Medicines  Take over-the-counter and prescription medicines only as told by your doctor.  If you were prescribed an antibiotic medicine, take it as told by your doctor. Do not stop taking it even if you start to feel better. General instructions  Make sure you: ? Pee until your bladder is empty. ? Do not hold pee for a long time. ? Empty your bladder after sex. ? Wipe from front to back after pooping if you are a female. Use each tissue one time when you wipe.  Drink enough fluid to keep your pee pale yellow.  Keep all follow-up visits as told by your doctor. This is important. Contact a doctor if:  You do not get better after 1-2 days.  Your symptoms go away and then come back. Get help right away if:  You have very bad back pain.  You have very bad pain in your lower belly.  You have a fever.  You are sick to your stomach (nauseous).  You are throwing up. Summary  A urinary tract infection (UTI) is an infection of any part of the urinary tract.  This condition is caused by germs in your genital area.  There are many risk factors for a UTI. These include having a small, thin   tube to drain pee and not being able to control when you pee or poop.  Treatment includes antibiotic medicines for germs.  Drink enough fluid to keep your pee pale yellow. This information is not intended to replace advice given to you by your health care provider. Make sure you discuss any questions you have with your health care provider. Document Released: 11/27/2007 Document Revised: 05/28/2018 Document Reviewed: 12/18/2017 Elsevier Patient Education  2020 [employer]  

## 2019-04-02 NOTE — Progress Notes (Signed)
Virtual Visit via Telephone Note  I connected with Virginia Young on 04/02/19 at 3:03 PM  by telephone and verified that I am speaking with the correct person using two identifiers. Virginia Young is currently located at home and nobody is currently with her during this visit. The provider, Loman Brooklyn, FNP is located in their home at time of visit.  I discussed the limitations, risks, security and privacy concerns of performing an evaluation and management service by telephone and the availability of in person appointments. I also discussed with the patient that there may be a patient responsible charge related to this service. The patient expressed understanding and agreed to proceed.  Subjective: PCP: Baruch Gouty, FNP  Chief Complaint  Patient presents with  . Urinary Tract Infection   Urinary Tract Infection: Patient complains of abnormal smelling urine, burning with urination and frequency She has had symptoms for a few weeks. Patient also complains of back pain. Patient denies fever and stomach ache. Patient does have a history of recurrent UTI.     ROS: Per HPI  Current Outpatient Medications:  .  [employer]S D3 25 MCG (1000 UT) capsule, Take 1 capsule (1,000 Units total) by mouth daily., Disp: 90 capsule, Rfl: 1 .  divalproex (DEPAKOTE ER) 250 MG 24 hr tablet, TAKE 1 TABLET (250 MG TOTAL) BY MOUTH DAILY. APPOINTMENT NEEDED FOR FURTHER REFILLS., Disp: 30 tablet, Rfl: 0 .  ELIQUIS 5 MG TABS tablet, TAKE 1 TABLET (5 MG TOTAL) BY MOUTH EVERY 12 (TWELVE) HOURS. (PLEASE MAKE 6 MOS APPT AUG), Disp: 60 tablet, Rfl: 1 .  furosemide (LASIX) 20 MG tablet, Take 60 mg by mouth., Disp: , Rfl:  .  linaclotide (LINZESS) 145 MCG CAPS capsule, TAKE 1 CAPSULE (145 MCG TOTAL) BY MOUTH DAILY BEFORE BREAKFAST., Disp: 90 capsule, Rfl: 0 .  losartan (COZAAR) 25 MG tablet, Take 12.5 mg by mouth daily., Disp: , Rfl:  .  Multiple Vitamin (MULTIVITAMIN) tablet, Take 1 tablet by mouth daily., Disp: ,  Rfl:  .  QUEtiapine (SEROQUEL) 50 MG tablet, TAKE 1 TABLET BY MOUTH TWICE A DAY, Disp: 180 tablet, Rfl: 4  Allergies  Allergen Reactions  . Bactrim [Sulfamethoxazole-Trimethoprim]     Mouth sores  . Lipitor [Atorvastatin Calcium]     myalgia  . Lisinopril     cough   Past Medical History:  Diagnosis Date  . Allergy   . Atrial fibrillation (HCC)    coumadin  . Cataract   . Chest pain 03/2012   a. Lex MV 10/13:  EF 64%, dist ant and apical defect sugg of soft tissue atten, no ischemia  . Chronic systolic heart failure ([place])   . Depression   . GERD (gastroesophageal reflux disease)   . HLD (hyperlipidemia)   . Mild mitral regurgitation   . MS (multiple sclerosis) ([place])   . NICM (nonischemic cardiomyopathy) ([place])    a. neg CLite in 2003;  b. EF 40-45% in past;   c.  Echo 12/11: EF 35-40%, mild MR, mild LAE, mild RAE, small pericardial effusion   . Osteoarthritis   . Osteoporosis   . Stasis ulcer ([place])   . Vaginal prolapse without uterine prolapse   . Varicose veins     Observations/Objective: A&O  No respiratory distress or wheezing audible over the phone Mood, judgement, and thought processes all WNL  Assessment and Plan: 1. Suspected UTI - Education provided on UTI. Encouraged adequate hydration.  - Urine Culture - Urinalysis, Complete -  cefdinir (OMNICEF) 300 MG capsule; Take 1 capsule (300 mg total) by mouth 2 (two) times daily for 7 days. 1 po BID  Dispense: 14 capsule; Refill: 0   Follow Up Instructions:  I discussed the assessment and treatment plan with the patient. The patient was provided an opportunity to ask questions and all were answered. The patient agreed with the plan and demonstrated an understanding of the instructions.   The patient was advised to call back or seek an in-person evaluation if the symptoms worsen or if the condition fails to improve as anticipated.  The above assessment and management plan was discussed with the patient. The patient  verbalized understanding of and has agreed to the management plan. Patient is aware to call the clinic if symptoms persist or worsen. Patient is aware when to return to the clinic for a follow-up visit. Patient educated on when it is appropriate to go to the emergency department.   Time call ended: 3:11 PM  I provided 9 minutes of non-face-to-face time during this encounter.  Hendricks Limes, MSN, APRN, FNP-C [facility] 04/02/19

## 2019-04-06 ENCOUNTER — Other Ambulatory Visit: Payer: Self-pay | Admitting: Family Medicine

## 2019-04-06 DIAGNOSIS — N3001 Acute cystitis with hematuria: Secondary | ICD-10-CM

## 2019-04-06 LAB — URINE CULTURE

## 2019-04-07 ENCOUNTER — Other Ambulatory Visit: Payer: Self-pay | Admitting: *Deleted

## 2019-04-07 DIAGNOSIS — B9620 Unspecified Escherichia coli [E. coli] as the cause of diseases classified elsewhere: Secondary | ICD-10-CM

## 2019-04-07 DIAGNOSIS — N390 Urinary tract infection, site not specified: Secondary | ICD-10-CM

## 2019-04-12 ENCOUNTER — Ambulatory Visit (INDEPENDENT_AMBULATORY_CARE_PROVIDER_SITE_OTHER): Payer: Medicare Other | Admitting: Family Medicine

## 2019-04-12 ENCOUNTER — Encounter: Payer: Self-pay | Admitting: Family Medicine

## 2019-04-12 ENCOUNTER — Other Ambulatory Visit: Payer: Self-pay | Admitting: Neurology

## 2019-04-12 DIAGNOSIS — N3090 Cystitis, unspecified without hematuria: Secondary | ICD-10-CM | POA: Diagnosis not present

## 2019-04-12 MED ORDER — CIPROFLOXACIN HCL 250 MG PO TABS: 250.0000 mg | ORAL_TABLET | Freq: Two times a day (BID) | ORAL | 0 refills | Status: AC

## 2019-04-12 NOTE — Progress Notes (Signed)
Subjective:    Patient ID: Virginia Young, female    DOB: 10-06-1940, 78 y.o.   MRN: [id number]   HPI: ONDINE FRANCIS is a 78 y.o. female presenting for continued dysuria after treatment for UTI. Finished ABX 2 dayss ago. Noted dysuria and frequency yesterday. No fever. No Diarrhea. No fever. Review of urine culture reveals that the germ was resistant to 2nd generation cephaolsporins.   Depression screen [facility] 2/9 02/17/2019 06/10/2018 03/11/2018 02/02/2018 01/22/2018  Decreased Interest 0 0 0 0 0  Down, Depressed, Hopeless 1 0 0 0 0  PHQ - 2 Score 1 0 0 0 0  Altered sleeping - - - - -  PHQ-9 Score - - - - -  Some recent data might be hidden     Relevant past medical, surgical, family and social history reviewed and updated as indicated.  Interim medical history since our last visit reviewed. Allergies and medications reviewed and updated.  ROS:  Review of Systems  Constitutional: Negative for chills, diaphoresis and fever.  HENT: Negative for congestion.   Eyes: Negative for visual disturbance.  Respiratory: Negative for cough and shortness of breath.   Cardiovascular: Negative for chest pain and palpitations.  Gastrointestinal: Negative for constipation, diarrhea and nausea.  Genitourinary: Positive for dysuria, frequency and urgency. Negative for decreased urine volume, flank pain, hematuria, menstrual problem and pelvic pain.  Musculoskeletal: Negative for arthralgias and joint swelling.  Skin: Negative for rash.  Neurological: Negative for dizziness and numbness.     Social History   Tobacco Use  Smoking Status Never Smoker  Smokeless Tobacco Never Used       Objective:     Wt Readings from Last 3 Encounters:  02/17/19 199 lb (90.3 kg)  02/16/19 199 lb 3.2 oz (90.4 kg)  02/03/19 203 lb 8 oz (92.3 kg)     Exam deferred. Pt. [place] due to COVID 19. Phone visit performed.   Assessment & Plan:   1. Cystitis     Meds ordered this encounter   Medications  . ciprofloxacin (CIPRO) 250 MG tablet    Sig: Take 1 tablet (250 mg total) by mouth 2 (two) times daily for 7 days.    Dispense:  14 tablet    Refill:  0    No orders of the defined types were placed in this encounter.     Diagnoses and all orders for this visit:  Cystitis  Other orders -     ciprofloxacin (CIPRO) 250 MG tablet; Take 1 tablet (250 mg total) by mouth 2 (two) times daily for 7 days.    Virtual Visit via telephone Note  I discussed the limitations, risks, security and privacy concerns of performing an evaluation and management service by telephone and the availability of in person appointments. The patient was identified with two identifiers. Pt.expressed understanding and agreed to proceed. Pt. Is at home. Dr. Livia Snellen is in his office.  Follow Up Instructions:   I discussed the assessment and treatment plan with the patient. The patient was provided an opportunity to ask questions and all were answered. The patient agreed with the plan and demonstrated an understanding of the instructions.   The patient was advised to call back or seek an in-person evaluation if the symptoms worsen or if the condition fails to improve as anticipated.   Total minutes including chart review and phone contact time: 11   Follow up plan: Return if symptoms worsen or fail to improve.  Claretta Fraise,  MD [place]

## 2019-04-15 ENCOUNTER — Other Ambulatory Visit: Payer: Self-pay | Admitting: *Deleted

## 2019-04-15 MED ORDER — DIVALPROEX SODIUM ER 250 MG PO TB24: 250.0000 mg | ORAL_TABLET | Freq: Every day | ORAL | 2 refills | Status: DC

## 2019-05-07 ENCOUNTER — Ambulatory Visit (INDEPENDENT_AMBULATORY_CARE_PROVIDER_SITE_OTHER): Payer: Medicare Other | Admitting: Family Medicine

## 2019-05-07 ENCOUNTER — Emergency Department (HOSPITAL_COMMUNITY)
Admission: EM | Admit: 2019-05-07 | Discharge: 2019-05-07 | Disposition: A | Discharge status: Home / Self Care | Payer: Medicare Other | Attending: Emergency Medicine | Admitting: Emergency Medicine

## 2019-05-07 ENCOUNTER — Encounter (HOSPITAL_COMMUNITY): Payer: Self-pay | Admitting: Emergency Medicine

## 2019-05-07 ENCOUNTER — Encounter: Payer: Self-pay | Admitting: Family Medicine

## 2019-05-07 ENCOUNTER — Other Ambulatory Visit: Payer: Self-pay

## 2019-05-07 DIAGNOSIS — R300 Dysuria: Secondary | ICD-10-CM | POA: Diagnosis not present

## 2019-05-07 DIAGNOSIS — R8290 Unspecified abnormal findings in urine: Secondary | ICD-10-CM

## 2019-05-07 DIAGNOSIS — R531 Weakness: Secondary | ICD-10-CM | POA: Diagnosis not present

## 2019-05-07 DIAGNOSIS — N3001 Acute cystitis with hematuria: Secondary | ICD-10-CM | POA: Insufficient documentation

## 2019-05-07 LAB — COMPREHENSIVE METABOLIC PANEL
ALT: 23 U/L (ref 0–44)
AST: 23 U/L (ref 15–41)
Albumin: 3.8 g/dL (ref 3.5–5.0)
Alkaline Phosphatase: 105 U/L (ref 38–126)
Anion gap: 8 (ref 5–15)
BUN: 21 mg/dL (ref 8–23)
CO2: 30 mmol/L (ref 22–32)
Calcium: 8.9 mg/dL (ref 8.9–10.3)
Chloride: 100 mmol/L (ref 98–111)
Creatinine, Ser: 1.01 mg/dL — ABNORMAL HIGH (ref 0.44–1.00)
GFR calc Af Amer: >60 mL/min (ref >60)
GFR calc non Af Amer: 53 mL/min — ABNORMAL LOW (ref >60)
Glucose, Bld: 99 mg/dL (ref 70–99)
Potassium: 3.7 mmol/L (ref 3.5–5.1)
Sodium: 138 mmol/L (ref 135–145)
Total Bilirubin: 0.6 mg/dL (ref 0.3–1.2)
Total Protein: 7.2 g/dL (ref 6.5–8.1)

## 2019-05-07 LAB — CBC WITH DIFFERENTIAL/PLATELET
Abs Immature Granulocytes: 0.05 10*3/uL (ref 0.00–0.07)
Basophils Absolute: 0.1 10*3/uL (ref 0.0–0.1)
Basophils Relative: 1 %
Eosinophils Absolute: 0.5 10*3/uL (ref 0.0–0.5)
Eosinophils Relative: 5 %
HCT: 45.7 % (ref 36.0–46.0)
Hemoglobin: 14.5 g/dL (ref 12.0–15.0)
Immature Granulocytes: 0 %
Lymphocytes Relative: 33 %
Lymphs Abs: 3.7 10*3/uL (ref 0.7–4.0)
MCH: 28.8 pg (ref 26.0–34.0)
MCHC: 31.7 g/dL (ref 30.0–36.0)
MCV: 90.7 fL (ref 80.0–100.0)
Monocytes Absolute: 0.9 10*3/uL (ref 0.1–1.0)
Monocytes Relative: 8 %
Neutro Abs: 5.9 10*3/uL (ref 1.7–7.7)
Neutrophils Relative %: 53 %
Platelets: 249 10*3/uL (ref 150–400)
RBC: 5.04 MIL/uL (ref 3.87–5.11)
RDW: 14.3 % (ref 11.5–15.5)
WBC: 11.2 10*3/uL — ABNORMAL HIGH (ref 4.0–10.5)
nRBC: 0 % (ref 0.0–0.2)

## 2019-05-07 LAB — URINALYSIS, ROUTINE W REFLEX MICROSCOPIC
Bilirubin Urine: NEGATIVE
Glucose, UA: NEGATIVE mg/dL
Ketones, ur: NEGATIVE mg/dL
Nitrite: NEGATIVE
Protein, ur: NEGATIVE mg/dL
Specific Gravity, Urine: 1.004 — ABNORMAL LOW (ref 1.005–1.030)
WBC, UA: >50 WBC/hpf — ABNORMAL HIGH (ref 0–5)
pH: 6 (ref 5.0–8.0)

## 2019-05-07 LAB — LACTIC ACID, PLASMA: Lactic Acid, Venous: 0.8 mmol/L (ref 0.5–1.9)

## 2019-05-07 MED ORDER — SODIUM CHLORIDE 0.9 % IV SOLN
1.0000 g | Freq: Once | INTRAVENOUS | Status: AC
  Administered 2019-05-07: 1 g via INTRAVENOUS
  Filled 2019-05-07: qty 10

## 2019-05-07 MED ORDER — CEPHALEXIN 500 MG PO CAPS: 500.0000 mg | ORAL_CAPSULE | Freq: Four times a day (QID) | ORAL | 0 refills | Status: DC

## 2019-05-07 MED ORDER — SODIUM CHLORIDE 0.9 % IV BOLUS
1000.0000 mL | Freq: Once | INTRAVENOUS | Status: AC
  Administered 2019-05-07: 1000 mL via INTRAVENOUS

## 2019-05-07 NOTE — ED Provider Notes (Signed)
[facility] EMERGENCY DEPARTMENT Provider Note   CSN: [id number] Arrival date & time: 05/07/19  1405     History   Chief Complaint Chief Complaint  Patient presents with  . Weakness    HPI Virginia Young is a 78 y.o. female.     Patient complains of foul-smelling urine.  She has recently been treated with Cipro for urinary tract infection.  The history is provided by the patient. No language interpreter was used.  Weakness Severity:  Mild Onset quality:  Gradual Timing:  Constant Progression:  Waxing and waning Chronicity:  New Context: not alcohol use   Relieved by:  Nothing Worsened by:  Nothing Ineffective treatments:  None tried Associated symptoms: no abdominal pain, no chest pain, no cough, no diarrhea, no frequency, no headaches and no seizures     Past Medical History:  Diagnosis Date  . Allergy   . Atrial fibrillation (HCC)    coumadin  . Cataract   . Chest pain 03/2012   a. Lex MV 10/13:  EF 64%, dist ant and apical defect sugg of soft tissue atten, no ischemia  . Chronic systolic heart failure ([place])   . Depression   . GERD (gastroesophageal reflux disease)   . HLD (hyperlipidemia)   . Mild mitral regurgitation   . MS (multiple sclerosis) ([place])   . NICM (nonischemic cardiomyopathy) ([place])    a. neg CLite in 2003;  b. EF 40-45% in past;   c.  Echo 12/11: EF 35-40%, mild MR, mild LAE, mild RAE, small pericardial effusion   . Osteoarthritis   . Osteoporosis   . Stasis ulcer ([place])   . Vaginal prolapse without uterine prolapse   . Varicose veins     Patient Active Problem List   Diagnosis Date Noted  . Vitamin D deficiency 02/17/2019  . Constipation 02/17/2019  . Witnessed seizure-like activity ([place]) 02/03/2019  . Chronic systolic HF (heart failure) ([place]) 10/08/2018  . Educated about COVID-19 virus infection 10/08/2018  . Dementia with behavioral disturbance ([place]) 02/10/2018  . S/P shoulder replacement, right 08/22/2017  . HTN  (hypertension) 02/19/2016  . Post-nasal drip 05/15/2015  . Postoperative abdominal pain 12/07/2014  . Voiding dysfunction 10/07/2014  . SUI (stress urinary incontinence, female) 10/07/2014  . Osteoporosis with fracture 04/06/2014  . Impacted cerumen of both ears 08/20/2013  . DOE (dyspnea on exertion) 07/23/2013  . Helicobacter positive gastritis 06/21/2013  . Orthostatic hypotension 06/10/2013  . UTI (lower urinary tract infection) 05/06/2013  . Neck mass 02/19/2013  . Neck pain, bilateral 02/19/2013  . Right groin mass 11/08/2011  . Low back pain 11/08/2011  . Lipoma of neck 06/21/2011  . Memory loss 06/21/2011  . OVERACTIVE BLADDER 08/21/2010  . Long term current use of anticoagulant 07/25/2010  . Osteoarthritis 01/18/2010  . KNEE PAIN 01/18/2010  . FOOT PAIN 01/18/2010  . Mixed hyperlipidemia 04/20/2009  . GERD 04/17/2009  . MITRAL REGURGITATION, MILD 08/30/2008  . NICM (nonischemic cardiomyopathy) ([place]) 08/30/2008  . Congestive heart failure ([place]) 04/13/2007  . DEPRESSION 04/10/2007  . DISEASE, MITRAL VALVE NEC/NOS 04/10/2007  . Atrial fibrillation ([place]) 04/10/2007  . STASIS ULCER 04/10/2007  . VENOUS INSUFFICIENCY, LEGS 04/10/2007    Past Surgical History:  Procedure Laterality Date  . ANTERIOR AND POSTERIOR VAGINAL REPAIR W/ SACROSPINOUS LIGAMENT SUSPENSION  2016   [facility]  . BLADDER SURGERY    . CARPAL TUNNEL RELEASE Right 08/22/2017   Procedure: CARPAL TUNNEL RELEASE;  Surgeon: [name], MD;  Location: [facility];  Service:  Orthopedics;  Laterality: Right;  . cataract surgery Bilateral   . EYE SURGERY    . INGUINAL HERNIA REPAIR     right  . LIPOMA EXCISION     h/o removed from upper back x 2  . REVERSE SHOULDER ARTHROPLASTY Right 08/22/2017   Procedure: REVERSE SHOULDER ARTHROPLASTY;  Surgeon: [name], MD;  Location: [facility];  Service: Orthopedics;  Laterality: Right;     OB History    Gravida      Para      Term      Preterm      AB      Living   3     SAB      TAB      Ectopic      Multiple      Live Births               Home Medications    Prior to Admission medications   Medication Sig Start Date End Date Taking? Authorizing Provider  CVS D3 25 MCG (1000 UT) capsule Take 1 capsule (1,000 Units total) by mouth daily. 02/10/19  Yes Rakes, Connye Burkitt, FNP  divalproex (DEPAKOTE ER) 250 MG 24 hr tablet Take 1 tablet (250 mg total) by mouth daily. Must keep pending appointment for further refills. 04/15/19  Yes Melvenia Beam, MD  ELIQUIS 5 MG TABS tablet TAKE 1 TABLET (5 MG TOTAL) BY MOUTH EVERY 12 (TWELVE) HOURS. (PLEASE MAKE 6 MOS APPT AUG) 03/22/19  Yes Rakes, Connye Burkitt, FNP  furosemide (LASIX) 20 MG tablet Take 60 mg by mouth.   Yes [provider]  linaclotide (LINZESS) 145 MCG CAPS capsule TAKE 1 CAPSULE (145 MCG TOTAL) BY MOUTH DAILY BEFORE BREAKFAST. 02/17/19  Yes Rakes, Connye Burkitt, FNP  losartan (COZAAR) 25 MG tablet Take 12.5 mg by mouth daily.   Yes [provider]  Multiple Vitamin (MULTIVITAMIN) tablet Take 1 tablet by mouth daily.   Yes [provider]  QUEtiapine (SEROQUEL) 50 MG tablet TAKE 1 TABLET BY MOUTH TWICE A DAY 03/26/18  Yes Melvenia Beam, MD  cephALEXin (KEFLEX) 500 MG capsule Take 1 capsule (500 mg total) by mouth 4 (four) times daily. 05/07/19   Milton Ferguson, MD    Family History Family History  Problem Relation Age of Onset  . Diabetes Father 63       diabetes and syncope  . Heart attack Father   . Heart attack Sister   . Heart attack Brother        all 5 brothers had MIs  . Stroke Brother   . Cancer Sister        bone  . Diabetes Sister   . Cancer Sister   . Diabetes Sister   . Heart attack Brother   . Dementia Mother   . Dementia Brother   . CAD Other   . Diabetes Daughter   . Colon cancer Neg Hx   . Neuropathy Neg Hx     Social History Social History   Tobacco Use  . Smoking status: Never Smoker  . Smokeless tobacco: Never Used  Substance Use  Topics  . Alcohol use: No  . Drug use: No     Allergies   Bactrim [sulfamethoxazole-trimethoprim], Lipitor [atorvastatin calcium], and Lisinopril   Review of Systems Review of Systems  Constitutional: Negative for appetite change and fatigue.  HENT: Negative for congestion, ear discharge and sinus pressure.   Eyes: Negative for discharge.  Respiratory: Negative for  cough.   Cardiovascular: Negative for chest pain.  Gastrointestinal: Negative for abdominal pain and diarrhea.  Genitourinary: Negative for frequency and hematuria.       Dysuria  Musculoskeletal: Negative for back pain.  Skin: Negative for rash.  Neurological: Positive for weakness. Negative for seizures and headaches.  Psychiatric/Behavioral: Negative for hallucinations.     Physical Exam Updated Vital Signs BP 108/68   Pulse 79   Temp 97.8 F (36.6 C) (Oral)   Resp 18   Ht 5\' 10"  (1.778 m)   Wt 88.5 kg   SpO2 97%   BMI 27.98 kg/m   Physical Exam   ED Treatments / Results  Labs (all labs ordered are listed, but only abnormal results are displayed) Labs Reviewed  URINALYSIS, ROUTINE W REFLEX MICROSCOPIC - Abnormal; Notable for the following components:      Result Value   APPearance CLOUDY (*)    Specific Gravity, Urine 1.004 (*)    Hgb urine dipstick SMALL (*)    Leukocytes,Ua LARGE (*)    WBC, UA >50 (*)    Bacteria, UA FEW (*)    All other components within normal limits  CBC WITH DIFFERENTIAL/PLATELET - Abnormal; Notable for the following components:   WBC 11.2 (*)    All other components within normal limits  COMPREHENSIVE METABOLIC PANEL - Abnormal; Notable for the following components:   Creatinine, Ser 1.01 (*)    GFR calc non Af Amer 53 (*)    All other components within normal limits  URINE CULTURE  LACTIC ACID, PLASMA    EKG None  Radiology No results found.  Procedures Procedures (including critical care time)  Medications Ordered in ED Medications  sodium chloride  0.9 % bolus 1,000 mL (0 mLs Intravenous Stopped 05/07/19 1725)  cefTRIAXone (ROCEPHIN) 1 g in sodium chloride 0.9 % 100 mL IVPB (0 g Intravenous Stopped 05/07/19 1726)     Initial Impression / Assessment and Plan / ED Course  I have reviewed the triage vital signs and the nursing notes.  Pertinent labs & imaging results that were available during my care of the patient were reviewed by me and considered in my medical decision making (see chart for details).        Patient with urinary tract infection.  She does not seem to be septic.  She is given Rocephin and Keflex and will follow up with her PCP  Final Clinical Impressions(s) / ED Diagnoses   Final diagnoses:  Acute cystitis with hematuria    ED Discharge Orders         Ordered    cephALEXin (KEFLEX) 500 MG capsule  4 times daily     05/07/19 1727           Milton Ferguson, MD 05/07/19 1731

## 2019-05-07 NOTE — ED Triage Notes (Signed)
PT c/o continued lower back pain and cloudy/foul smelling urine not relieved by two antibiotic prescriptions this week. PT states she feels generalized weakness and lightheaded at times.

## 2019-05-07 NOTE — Progress Notes (Signed)
Virtual Visit via telephone Note Due to COVID-19 pandemic this visit was conducted virtually. This visit type was conducted due to national recommendations for restrictions regarding the COVID-19 Pandemic (e.g. social distancing, sheltering in place) in an effort to limit this patient's exposure and mitigate transmission in our community. All issues noted in this document were discussed and addressed.  A physical exam was not performed with this format.   I connected with Virginia Young's cargiver on 05/07/2019 at 1330 by telephone and verified that I am speaking with the correct person using two identifiers. Virginia Young is currently located at home and caregiver is currently with them during visit. The provider, Monia Pouch, FNP is located in their office at time of visit.  I discussed the limitations, risks, security and privacy concerns of performing an evaluation and management service by telephone and the availability of in person appointments. I also discussed with the patient that there may be a patient responsible charge related to this service. The patient expressed understanding and agreed to proceed.  Subjective:  Patient ID: Virginia Young, female    DOB: 1940-10-20, 78 y.o.   MRN: [id number]  Chief Complaint:  Fatigue and Dysuria   HPI: Virginia Young is a 78 y.o. female presenting on 05/07/2019 for Fatigue and Dysuria   Caregiver calls today for [name]. Caregiver states pt has increased weakness, confusion, fatigue, malaise, and malodorous urine. States she has trouble walking due to her increased fatigue and weakness. States she has urinary frequency. No reported fevers. Pt denies abdominal pain or back pain. States she just does not feel good. Recent history of UTI.     Relevant past medical, surgical, family, and social history reviewed and updated as indicated.  Allergies and medications reviewed and updated.   Past Medical History:   Diagnosis Date  . Allergy   . Atrial fibrillation (HCC)    coumadin  . Cataract   . Chest pain 03/2012   a. Lex MV 10/13:  EF 64%, dist ant and apical defect sugg of soft tissue atten, no ischemia  . Chronic systolic heart failure ([place])   . Depression   . GERD (gastroesophageal reflux disease)   . HLD (hyperlipidemia)   . Mild mitral regurgitation   . MS (multiple sclerosis) ([place])   . NICM (nonischemic cardiomyopathy) ([place])    a. neg CLite in 2003;  b. EF 40-45% in past;   c.  Echo 12/11: EF 35-40%, mild MR, mild LAE, mild RAE, small pericardial effusion   . Osteoarthritis   . Osteoporosis   . Stasis ulcer ([place])   . Vaginal prolapse without uterine prolapse   . Varicose veins     Past Surgical History:  Procedure Laterality Date  . ANTERIOR AND POSTERIOR VAGINAL REPAIR W/ SACROSPINOUS LIGAMENT SUSPENSION  2016   [facility]  . BLADDER SURGERY    . CARPAL TUNNEL RELEASE Right 08/22/2017   Procedure: CARPAL TUNNEL RELEASE;  Surgeon: [name], MD;  Location: [facility];  Service: Orthopedics;  Laterality: Right;  . cataract surgery Bilateral   . EYE SURGERY    . INGUINAL HERNIA REPAIR     right  . LIPOMA EXCISION     h/o removed from upper back x 2  . REVERSE SHOULDER ARTHROPLASTY Right 08/22/2017   Procedure: REVERSE SHOULDER ARTHROPLASTY;  Surgeon: [name], MD;  Location: [facility];  Service: Orthopedics;  Laterality: Right;    Social History   Socioeconomic History  . Marital status: Divorced  Spouse name: Not on file  . Number of children: 3  . Years of education: 75  . Highest education level: Not on file  Occupational History  . Occupation: retired    Fish farm manager: RETIRED  Social Needs  . Financial resource strain: Not on file  . Food insecurity    Worry: Not on file    Inability: Not on file  . Transportation needs    Medical: Not on file    Non-medical: Not on file  Tobacco Use  . Smoking status: Never Smoker  . Smokeless tobacco: Never Used  Substance and  Sexual Activity  . Alcohol use: No  . Drug use: No  . Sexual activity: Not Currently  Lifestyle  . Physical activity    Days per week: Not on file    Minutes per session: Not on file  . Stress: Not on file  Relationships  . Social Herbalist on phone: Not on file    Gets together: Not on file    Attends religious service: Not on file    Active member of club or organization: Not on file    Attends meetings of clubs or organizations: Not on file    Relationship status: Not on file  . Intimate partner violence    Fear of current or ex partner: Not on file    Emotionally abused: Not on file    Physically abused: Not on file    Forced sexual activity: Not on file  Other Topics Concern  . Not on file  Social History Narrative   DESIGNATED PARTY RELEASE ON FILE. Fleet Contras, April 26, 2010 10:09 AM.      Lives at home alone. She has a caregiver Monday through Friday during the day   Right-handed   Caffeine: 1-2 cup of coffee daily    Outpatient Encounter Medications as of 05/07/2019  Medication Sig  . [employer] D3 25 MCG (1000 UT) capsule Take 1 capsule (1,000 Units total) by mouth daily.  . divalproex (DEPAKOTE ER) 250 MG 24 hr tablet Take 1 tablet (250 mg total) by mouth daily. Must keep pending appointment for further refills.  Marland Kitchen ELIQUIS 5 MG TABS tablet TAKE 1 TABLET (5 MG TOTAL) BY MOUTH EVERY 12 (TWELVE) HOURS. (PLEASE MAKE 6 MOS APPT AUG)  . furosemide (LASIX) 20 MG tablet Take 60 mg by mouth.  . linaclotide (LINZESS) 145 MCG CAPS capsule TAKE 1 CAPSULE (145 MCG TOTAL) BY MOUTH DAILY BEFORE BREAKFAST.  Marland Kitchen losartan (COZAAR) 25 MG tablet Take 12.5 mg by mouth daily.  . Multiple Vitamin (MULTIVITAMIN) tablet Take 1 tablet by mouth daily.  . QUEtiapine (SEROQUEL) 50 MG tablet TAKE 1 TABLET BY MOUTH TWICE A DAY   No facility-administered encounter medications on file as of 05/07/2019.     Allergies  Allergen Reactions  . Bactrim [Sulfamethoxazole-Trimethoprim]      Mouth sores  . Lipitor [Atorvastatin Calcium]     myalgia  . Lisinopril     cough    Review of Systems  Constitutional: Positive for activity change, appetite change and fatigue. Negative for chills, diaphoresis, fever and unexpected weight change.  HENT: Negative.   Eyes: Negative.   Respiratory: Negative for cough, chest tightness and shortness of breath.   Cardiovascular: Negative for chest pain, palpitations and leg swelling.  Gastrointestinal: Negative for abdominal pain, blood in stool, constipation, diarrhea, nausea and vomiting.  Endocrine: Negative.   Genitourinary: Positive for frequency and urgency. Negative for decreased urine volume, difficulty  urinating, dysuria and flank pain.  Musculoskeletal: Negative for arthralgias, back pain and myalgias.  Skin: Negative.   Allergic/Immunologic: Negative.   Neurological: Positive for weakness. Negative for dizziness, tremors, seizures, syncope, facial asymmetry, speech difficulty, light-headedness, numbness and headaches.  Hematological: Negative.   Psychiatric/Behavioral: Positive for confusion. Negative for hallucinations, sleep disturbance and suicidal ideas.  All other systems reviewed and are negative.        Observations/Objective: No vital signs or physical exam, this was a telephone or virtual health encounter.  Pt alert and oriented, answers all questions appropriately, and able to speak in full sentences.    Assessment and Plan: Lunafreya was seen today for fatigue and dysuria.  Diagnoses and all orders for this visit:  Weakness Dysuria Malodorous urine Reported symptoms concerning for recurrent UTI and possible urosepsis. Caregiver and pt encouraged to be evaluated in the ED or UC for evaluation and treatment. Pt and caregiver agree and will go for evaluation.     Follow Up Instructions: Return if symptoms worsen or fail to improve.    I discussed the assessment and treatment plan with the patient. The  patient was provided an opportunity to ask questions and all were answered. The patient agreed with the plan and demonstrated an understanding of the instructions.   The patient was advised to call back or seek an in-person evaluation if the symptoms worsen or if the condition fails to improve as anticipated.  The above assessment and management plan was discussed with the patient. The patient verbalized understanding of and has agreed to the management plan. Patient is aware to call the clinic if they develop any new symptoms or if symptoms persist or worsen. Patient is aware when to return to the clinic for a follow-up visit. Patient educated on when it is appropriate to go to the emergency department.    I provided 15 minutes of non-face-to-face time during this encounter. The call started at 1330. The call ended at 1345. The other time was used for coordination of care.    Monia Pouch, FNP-C [facility] [address] [place], [place] [address] [phone] 05/07/2019

## 2019-05-07 NOTE — ED Notes (Signed)
ED Provider at bedside. 

## 2019-05-07 NOTE — Discharge Instructions (Addendum)
Drink plenty of fluids and follow-up with your family doctor next week.  They can check on the culture results

## 2019-05-07 NOTE — ED Notes (Signed)
EDP at bedside updating patient and family. 

## 2019-05-07 NOTE — ED Notes (Signed)
Pam Chambers called and reports she is pt's medical POA.  She gave permission for her mother's health information to be discussed with Mervin Kung.

## 2019-05-07 NOTE — ED Notes (Signed)
Pt daughter called for pt update. Daughter's name not listed on pt contact list. Told daughter I could not legally give her information and to have Pam call for update.

## 2019-05-10 LAB — URINE CULTURE
Culture: >=100000 — AB
Special Requests: NORMAL

## 2019-05-11 ENCOUNTER — Telehealth: Payer: Self-pay | Admitting: Emergency Medicine

## 2019-05-11 NOTE — Telephone Encounter (Signed)
Post ED Visit - Positive Culture Follow-up  Culture report reviewed by antimicrobial stewardship pharmacist: [place] [facility] []  Nathan Batchelder, [address].D. []  [place], Pharm.D., BCPS AQ-ID []  Heide Guile, Pharm.D., BCPS [x]  Parks Neptune, Pharm.D., BCPS []  [place], [place].D., BCPS, AAHIVP []  [place], Pharm.D., BCPS, AAHIVP []  Legrand Como, PharmD, BCPS []  Salome Arnt, PharmD, BCPS []  Johnnette Gourd, PharmD, BCPS []  Hughes Better, PharmD []  Leeroy Cha, PharmD, BCPS []  Laqueta Linden, PharmD  [place] Team []  [address], PharmD []  Leodis Sias, PharmD []  Lindell Spar, PharmD []  Royetta Asal, Rph []  Graylin Shiver) Rema Fendt, PharmD []  Glennon Mac, PharmD []  Arlyn Dunning, PharmD []  [name], PharmD []  Dia Sitter, PharmD []  Leone Haven, PharmD []  Gretta Arab, PharmD []  Theodis Shove, PharmD []  Peggyann Juba, PharmD   Positive urine culture Treated with cephalexin, organism sensitive to the same and no further patient follow-up is required at this time.  Reuel Boom 05/11/2019, 1:54 PM

## 2019-05-24 ENCOUNTER — Other Ambulatory Visit: Payer: Self-pay

## 2019-05-24 ENCOUNTER — Ambulatory Visit (INDEPENDENT_AMBULATORY_CARE_PROVIDER_SITE_OTHER): Payer: Medicare Other | Admitting: Family Medicine

## 2019-05-24 ENCOUNTER — Encounter: Payer: Self-pay | Admitting: Family Medicine

## 2019-05-24 VITALS — BP 117/77 | HR 52 | Temp 97.2°F | Ht 70.0 in | Wt 210.6 lb

## 2019-05-24 DIAGNOSIS — R569 Unspecified convulsions: Secondary | ICD-10-CM

## 2019-05-24 DIAGNOSIS — R42 Dizziness and giddiness: Secondary | ICD-10-CM | POA: Diagnosis not present

## 2019-05-24 DIAGNOSIS — R531 Weakness: Secondary | ICD-10-CM

## 2019-05-24 DIAGNOSIS — F0391 Unspecified dementia with behavioral disturbance: Secondary | ICD-10-CM | POA: Diagnosis not present

## 2019-05-24 DIAGNOSIS — N3000 Acute cystitis without hematuria: Secondary | ICD-10-CM

## 2019-05-24 NOTE — Patient Instructions (Signed)
We will continue Seroquel and Depakote as prescribed.   We will update labs today  Please make sure you are drinking plenty of water  Follow up closely with PCP for continued concerns of weakness and dizziness post UTI.   Follow up in 6 months, sooner if needed   Orthostatic Hypotension Blood pressure is a measurement of how strongly, or weakly, your blood is pressing against the walls of your arteries. Orthostatic hypotension is a sudden drop in blood pressure that happens when you quickly change positions, such as when you get up from sitting or lying down. Arteries are blood vessels that carry blood from your heart throughout your body. When blood pressure is too low, you may not get enough blood to your brain or to the rest of your organs. This can cause weakness, light-headedness, rapid heartbeat, and fainting. This can last for just a few seconds or for up to a few minutes. Orthostatic hypotension is usually not a serious problem. However, if it happens frequently or gets worse, it may be a sign of something more serious. What are the causes? This condition may be caused by:  Sudden changes in posture, such as standing up quickly after you have been sitting or lying down.  Blood loss.  Loss of body fluids (dehydration).  Heart problems.  Hormone (endocrine) problems.  Pregnancy.  Severe infection.  Lack of certain nutrients.  Severe allergic reactions (anaphylaxis).  Certain medicines, such as blood pressure medicine or medicines that make the body lose excess fluids (diuretics). Sometimes, this condition can be caused by not taking medicine as directed, such as taking too much of a certain medicine. What increases the risk? The following factors may make you more likely to develop this condition:  Age. Risk increases as you get older.  Conditions that affect the heart or the central nervous system.  Taking certain medicines, such as blood pressure medicine or  diuretics.  Being pregnant. What are the signs or symptoms? Symptoms of this condition may include:  Weakness.  Light-headedness.  Dizziness.  Blurred vision.  Fatigue.  Rapid heartbeat.  Fainting, in severe cases. How is this diagnosed? This condition is diagnosed based on:  Your medical history.  Your symptoms.  Your blood pressure measurement. Your health care provider will check your blood pressure when you are: ? Lying down. ? Sitting. ? Standing. A blood pressure reading is recorded as two numbers, such as "120 over 80" (or 120/80). The first ("top") number is called the systolic pressure. It is a measure of the pressure in your arteries as your heart beats. The second ("bottom") number is called the diastolic pressure. It is a measure of the pressure in your arteries when your heart relaxes between beats. Blood pressure is measured in a unit called mm Hg. Healthy blood pressure for most adults is 120/80. If your blood pressure is below 90/60, you may be diagnosed with hypotension. Other information or tests that may be used to diagnose orthostatic hypotension include:  Your other vital signs, such as your heart rate and temperature.  Blood tests.  Tilt table test. For this test, you will be safely secured to a table that moves you from a lying position to an upright position. Your heart rhythm and blood pressure will be monitored during the test. How is this treated? This condition may be treated by:  Changing your diet. This may involve eating more salt (sodium) or drinking more water.  Taking medicines to raise your blood pressure.  Changing the dosage of certain medicines you are taking that might be lowering your blood pressure.  Wearing compression stockings. These stockings help to prevent blood clots and reduce swelling in your legs. In some cases, you may need to go to the hospital for:  Fluid replacement. This means you will receive fluids through an  IV.  Blood replacement. This means you will receive donated blood through an IV (transfusion).  Treating an infection or heart problems, if this applies.  Monitoring. You may need to be monitored while medicines that you are taking wear off. Follow these instructions at home: Eating and drinking   Drink enough fluid to keep your urine pale yellow.  Eat a healthy diet, and follow instructions from your health care provider about eating or drinking restrictions. A healthy diet includes: ? Fresh fruits and vegetables. ? Whole grains. ? Lean meats. ? Low-fat dairy products.  Eat extra salt only as directed. Do not add extra salt to your diet unless your health care provider told you to do that.  Eat frequent, small meals.  Avoid standing up suddenly after eating. Medicines  Take over-the-counter and prescription medicines only as told by your health care provider. ? Follow instructions from your health care provider about changing the dosage of your current medicines, if this applies. ? Do not stop or adjust any of your medicines on your own. General instructions   Wear compression stockings as told by your health care provider.  Get up slowly from lying down or sitting positions. This gives your blood pressure a chance to adjust.  Avoid hot showers and excessive heat as directed by your health care provider.  Return to your normal activities as told by your health care provider. Ask your health care provider what activities are safe for you.  Do not use any products that contain nicotine or tobacco, such as cigarettes, e-cigarettes, and chewing tobacco. If you need help quitting, ask your health care provider.  Keep all follow-up visits as told by your health care provider. This is important. Contact a health care provider if you:  Vomit.  Have diarrhea.  Have a fever for more than 2-3 days.  Feel more thirsty than usual.  Feel weak and tired. Get help right away if  you:  Have chest pain.  Have a fast or irregular heartbeat.  Develop numbness in any part of your body.  Cannot move your arms or your legs.  Have trouble speaking.  Become sweaty or feel light-headed.  Faint.  Feel short of breath.  Have trouble staying awake.  Feel confused. Summary  Orthostatic hypotension is a sudden drop in blood pressure that happens when you quickly change positions.  Orthostatic hypotension is usually not a serious problem.  It is diagnosed by having your blood pressure taken lying down, sitting, and then standing.  It may be treated by changing your diet or adjusting your medicines. This information is not intended to replace advice given to you by your health care provider. Make sure you discuss any questions you have with your health care provider. Document Released: 05/31/2002 Document Revised: 12/04/2017 Document Reviewed: 12/04/2017 Elsevier Patient Education  2020 [place] [facility], Adult Being a patient in the hospital puts you at risk for falling. Falls can cause serious injury and harm, but they can be prevented. It is important to understand what puts you at risk for falling and what you and your health care team can do to prevent  you from falling. If you or a loved one falls at the hospital, it is important to tell hospital staff about it. What increases the risk for falls? Certain conditions and treatments may increase your risk of falling in the hospital. These include:  Being in an unfamiliar environment, especially when using the bathroom at night.  Having surgery.  Being on bed rest.  Taking many medicines or certain types of medicines, such as sleeping pills.  Having tubes in place, such as IV lines or catheters. Other risk factors for falls in a hospital include:  Having difficulty with hearing or vision.  Having a change in thinking or behavior, such as confusion.  Having depression.   Having trouble with balance.  Being a female.  Feeling dizzy.  Needing to use the toilet frequently.  Having fallen during the past three months.  Having low blood pressure. What are some strategies for preventing falls? If you or a loved one has to stay in the hospital:  Ask about which fall prevention strategies will be in place. Do not hesitate to speak up if you notice that the fall prevention plan has changed.  Ask for help moving around, especially after surgery or when feeling unwell.  If you have been asked to call for help when getting up, do not get up by yourself. Asking for help with getting up is for your safety, and the staff is there to help you.  Wear nonskid footwear.  Get up slowly, and sit at the side of the bed for a few minutes before standing up.  Keep items you need, such as the nurse call button or a phone, close to you so that you do not need to reach for them.  Wear eyeglasses or hearing aids if you have them.  Have someone stay in the hospital with you or your loved one.  Ask if sleeping pills or other medicines that can cause confusion are necessary. What does the hospital staff do to help prevent falls? Hospitals have systems in place to prevent falls and accidents, which may involve:  Discussing your fall risks and making a personalized fall prevention plan.  Checking in regularly to see if you need help.  Placing an arm band on your wrist or a sign near your room to alert other staff of your needs.  Using an alarm on your hospital bed. This is an alarm that goes off if you get out of bed and forget to call for help.  Keeping the bed in a low and locked position.  Keeping the area around the bed and bathroom well-lit and free from clutter.  Keeping your room quiet, so that you can sleep and be well-rested.  Using safety equipment, such as: ? A belt around your waist. ? Walkers, crutches, and other devices for support. ? Safety beds, such  as low beds or cushions on the floor next to the bed.  Having a staff person stay with you (one-on-one observation), even when you are using the bathroom. This is for your safety.  Using video monitoring. This allows a staff member to come to help you if you need help. What other actions can I take to lower my risk of falls?  Check in regularly with your health care provider or pharmacist to review all of the medicines that you take.  Make sure that you have a regular exercise program to stay fit. This will help you maintain your balance.  Talk with a physical therapist or  trainer if recommended by your health care provider. They can help you to improve your strength, balance, and endurance.  If you are over age 41: ? Ask your health care provider if you need a calcium or vitamin D supplement. ? Have your eyes and hearing checked every year. ? Have your feet checked every year. Where to find more information You can find more information about fall prevention from the Centers for Disease Control and Prevention: ImproveLook[employer]cz Summary  Being in an unfamiliar environment, such as the hospital, increases your risk for falling.  If you have been asked to call for help when getting up, do not get up by yourself. Asking for help with getting up is for your safety, and the staff is there to help you.  Ask about which fall prevention strategies will be in place. Do not hesitate to speak up if you notice that the fall prevention plan has changed.  If you or a loved one falls, tell the hospital staff. This is important. This information is not intended to replace advice given to you by your health care provider. Make sure you discuss any questions you have with your health care provider. Document Released: 06/07/2000 Document Revised: 05/23/2017 Document Reviewed: 01/22/2017 Elsevier Patient Education  2020 [employer].   Seizure, Adult A seizure is a sudden burst of abnormal electrical  activity in the brain. Seizures usually last from 30 seconds to 2 minutes. They can cause many different symptoms. Usually, seizures are not harmful unless they last a long time. What are the causes? Common causes of this condition include:  Fever or infection.  Conditions that affect the brain, such as: ? A brain abnormality that you were born with. ? A brain or head injury. ? Bleeding in the brain. ? A tumor. ? Stroke. ? Brain disorders such as autism or cerebral palsy.  Low blood sugar.  Conditions that are passed from parent to child (are inherited).  Problems with substances, such as: ? Having a reaction to a drug or a medicine. ? Suddenly stopping the use of a substance (withdrawal). In some cases, the cause may not be known. A person who has repeated seizures over time without a clear cause has a condition called epilepsy. What increases the risk? You are more likely to get this condition if you have:  A family history of epilepsy.  Had a seizure in the past.  A brain disorder.  A history of head injury, lack of oxygen at birth, or strokes. What are the signs or symptoms? There are many types of seizures. The symptoms vary depending on the type of seizure you have. Examples of symptoms during a seizure include:  Shaking (convulsions).  Stiffness in the body.  Passing out (losing consciousness).  Head nodding.  Staring.  Not responding to sound or touch.  Loss of bladder control and bowel control. Some people have symptoms right before and right after a seizure happens. Symptoms before a seizure may include:  Fear.  Worry (anxiety).  Feeling like you may vomit (nauseous).  Feeling like the room is spinning (vertigo).  Feeling like you saw or heard something before (dj vu).  Odd tastes or smells.  Changes in how you see. You may see flashing lights or spots. Symptoms after a seizure happens can include:  Confusion.  Sleepiness.  Headache.   Weakness on one side of the body. How is this treated? Most seizures will stop on their own in under 5 minutes. In these cases,  no treatment is needed. Seizures that last longer than 5 minutes will usually need treatment. Treatment can include:  Medicines given through an IV tube.  Avoiding things that are known to cause your seizures. These can include medicines that you take for another condition.  Medicines to treat epilepsy.  Surgery to stop the seizures. This may be needed if medicines do not help. Follow these instructions at home: Medicines  Take over-the-counter and prescription medicines only as told by your doctor.  Do not eat or drink anything that may keep your medicine from working, such as alcohol. Activity  Do not do any activities that would be dangerous if you had another seizure, like driving or swimming. Wait until your doctor says it is safe for you to do them.  If you live in the [place] ask your local DMV (department of motor vehicles) when you can drive.  Get plenty of rest. Teaching others Teach friends and family what to do when you have a seizure. They should:  Lay you on the ground.  Protect your head and body.  Loosen any tight clothing around your neck.  Turn you on your side.  Not hold you down.  Not put anything into your mouth.  Know whether or not you need emergency care.  Stay with you until you are better.  General instructions  Contact your doctor each time you have a seizure.  Avoid anything that gives you seizures.  Keep a seizure diary. Write down: ? What you think caused each seizure. ? What you remember about each seizure.  Keep all follow-up visits as told by your doctor. This is important. Contact a doctor if:  You have another seizure.  You have seizures more often.  There is any change in what happens during your seizures.  You keep having seizures with treatment.  You have symptoms of being sick or having  an infection. Get help right away if:  You have a seizure that: ? Lasts longer than 5 minutes. ? Is different than seizures you had before. ? Makes it harder to breathe. ? Happens after you hurt your head.  You have any of these symptoms after a seizure: ? Not being able to speak. ? Not being able to use a part of your body. ? Confusion. ? A bad headache.  You have two or more seizures in a row.  You do not wake up right after a seizure.  You get hurt during a seizure. These symptoms may be an emergency. Do not wait to see if the symptoms will go away. Get medical help right away. Call your local emergency services (911 in the [place] Do not drive yourself to the hospital. Summary  Seizures usually last from 30 seconds to 2 minutes. Usually, they are not harmful unless they last a long time.  Do not eat or drink anything that may keep your medicine from working, such as alcohol.  Teach friends and family what to do when you have a seizure.  Contact your doctor each time you have a seizure. This information is not intended to replace advice given to you by your health care provider. Make sure you discuss any questions you have with your health care provider. Document Released: 11/27/2007 Document Revised: 08/28/2018 Document Reviewed: 08/28/2018 Elsevier Patient Education  [place].
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
## 2019-05-25 ENCOUNTER — Telehealth: Payer: Self-pay | Admitting: Family Medicine

## 2019-05-25 ENCOUNTER — Telehealth: Payer: Self-pay

## 2019-05-25 LAB — URINALYSIS
Bilirubin, UA: NEGATIVE
Glucose, UA: NEGATIVE
Ketones, UA: NEGATIVE
Nitrite, UA: POSITIVE — AB
Protein,UA: NEGATIVE
RBC, UA: NEGATIVE
Specific Gravity, UA: 1.008 (ref 1.005–1.030)
Urobilinogen, Ur: 0.2 mg/dL (ref 0.2–1.0)
pH, UA: 6.5 (ref 5.0–7.5)

## 2019-05-25 LAB — COMPREHENSIVE METABOLIC PANEL
ALT: 20 IU/L (ref 0–32)
AST: 21 IU/L (ref 0–40)
Albumin/Globulin Ratio: 1.6 (ref 1.2–2.2)
Albumin: 4.1 g/dL (ref 3.7–4.7)
Alkaline Phosphatase: 126 IU/L — ABNORMAL HIGH (ref 39–117)
BUN/Creatinine Ratio: 12 (ref 12–28)
BUN: 12 mg/dL (ref 8–27)
Bilirubin Total: 0.6 mg/dL (ref 0.0–1.2)
CO2: 26 mmol/L (ref 20–29)
Calcium: 8.8 mg/dL (ref 8.7–10.3)
Chloride: 102 mmol/L (ref 96–106)
Creatinine, Ser: 0.97 mg/dL (ref 0.57–1.00)
GFR calc Af Amer: 64 mL/min/{1.73_m2} (ref >59)
GFR calc non Af Amer: 56 mL/min/{1.73_m2} — ABNORMAL LOW (ref >59)
Globulin, Total: 2.6 g/dL (ref 1.5–4.5)
Glucose: 92 mg/dL (ref 65–99)
Potassium: 4 mmol/L (ref 3.5–5.2)
Sodium: 141 mmol/L (ref 134–144)
Total Protein: 6.7 g/dL (ref 6.0–8.5)

## 2019-05-25 LAB — CBC WITH DIFFERENTIAL/PLATELET
Basophils Absolute: 0.1 10*3/uL (ref 0.0–0.2)
Basos: 1 %
EOS (ABSOLUTE): 0.3 10*3/uL (ref 0.0–0.4)
Eos: 3 %
Hematocrit: 43.4 % (ref 34.0–46.6)
Hemoglobin: 14.7 g/dL (ref 11.1–15.9)
Immature Grans (Abs): 0.1 10*3/uL (ref 0.0–0.1)
Immature Granulocytes: 1 %
Lymphocytes Absolute: 3.4 10*3/uL — ABNORMAL HIGH (ref 0.7–3.1)
Lymphs: 36 %
MCH: 29.1 pg (ref 26.6–33.0)
MCHC: 33.9 g/dL (ref 31.5–35.7)
MCV: 86 fL (ref 79–97)
Monocytes Absolute: 0.8 10*3/uL (ref 0.1–0.9)
Monocytes: 8 %
Neutrophils Absolute: 4.8 10*3/uL (ref 1.4–7.0)
Neutrophils: 51 %
Platelets: 248 10*3/uL (ref 150–450)
RBC: 5.05 x10E6/uL (ref 3.77–5.28)
RDW: 13.4 % (ref 11.7–15.4)
WBC: 9.4 10*3/uL (ref 3.4–10.8)

## 2019-05-25 LAB — VALPROIC ACID LEVEL: Valproic Acid Lvl: 23 ug/mL — ABNORMAL LOW (ref 50–100)

## 2019-05-25 MED ORDER — CEPHALEXIN 500 MG PO CAPS: 500.0000 mg | ORAL_CAPSULE | Freq: Two times a day (BID) | ORAL | 0 refills | Status: DC

## 2019-05-25 NOTE — Telephone Encounter (Signed)
Amy from [facility] wants to speak with patients nurse regarding lab results and UTI and to be given advise on how to proceed with patient.

## 2019-05-25 NOTE — Telephone Encounter (Signed)
Called Pts PCPs office. The provider was with a pt and able to speak with [place] directly as was her Nurse/CMA.  Asked the Medical rep on the phone if they have Epic and are able to see results. The answered yes. So there would be no need to fax the lab results. The individual stated that she would route over the labs and messages to the provider and CMA/Nurse.  "I need you to call PCP and let them know that UA in the office yesterday shows leukocytes and nitrites. They should be able to access results in Epic, if not, we can fax. WBC's did improve and now 9.4 from 11.2. She was seen yesterday for continued weakness and dizziness with standing. She had positive orthostatic pressures. I have ordered culture and will update as soon as I receive results. Please ask PCP to help guide treatment options. She has been treated with cefdinir, Cipro, Rocephin and Keflex. Patient reports finishing abx about 9 days ago. After speaking with PCP office, make sure to call patients daughter, Virginia Young, and let her know that [name] needs follow up asap with PCP. TY." -Per NP Amy Lomax.   I did attempt to reach pts daughter to relay so information regarding pts recent labs. Also to f/u with PCP for treatment options. There was no answer. Left a VM.

## 2019-05-25 NOTE — Telephone Encounter (Signed)
rx called in

## 2019-05-25 NOTE — Telephone Encounter (Signed)
Daughter  Virginia Young aware

## 2019-05-25 NOTE — Telephone Encounter (Signed)
MMM, ( coverage for [facility] today)   Please pull up recent labs from Neurology ( in epic)   We will need to treat UTI , order and call pt   Please advise and I will call pt.

## 2019-05-25 NOTE — Telephone Encounter (Signed)
Virginia Young @ [facility] NP Amy back to inform her they will treat pt with  Antibiotic for her UTI Roselyn Reef can be called back at [phone]

## 2019-05-25 NOTE — Telephone Encounter (Signed)
I spoke with Virginia Young. She is contacting on call provider and will call patient's daughter with treatment plan. Her daughter is expecting call. Culture results pending and Virginia Young will monitor for those to return.

## 2019-05-26 LAB — URINE CULTURE

## 2019-05-26 NOTE — Telephone Encounter (Signed)
Faxed lab result to PCP FNP [facility] office, per NP Amy Lomax request.

## 2019-06-10 ENCOUNTER — Other Ambulatory Visit: Payer: Self-pay | Admitting: Family Medicine

## 2019-06-10 DIAGNOSIS — I480 Paroxysmal atrial fibrillation: Secondary | ICD-10-CM

## 2019-06-10 NOTE — Telephone Encounter (Signed)
OV 08/05/19

## 2019-07-01 ENCOUNTER — Ambulatory Visit (INDEPENDENT_AMBULATORY_CARE_PROVIDER_SITE_OTHER): Payer: Medicare Other | Admitting: Family Medicine

## 2019-07-01 ENCOUNTER — Encounter: Payer: Self-pay | Admitting: Family Medicine

## 2019-07-01 DIAGNOSIS — N390 Urinary tract infection, site not specified: Secondary | ICD-10-CM | POA: Diagnosis not present

## 2019-07-01 DIAGNOSIS — R399 Unspecified symptoms and signs involving the genitourinary system: Secondary | ICD-10-CM

## 2019-07-01 NOTE — Progress Notes (Signed)
Virtual Visit via telephone Note Due to COVID-19 pandemic this visit was conducted virtually. This visit type was conducted due to national recommendations for restrictions regarding the COVID-19 Pandemic (e.g. social distancing, sheltering in place) in an effort to limit this patient's exposure and mitigate transmission in our community. All issues noted in this document were discussed and addressed.  A physical exam was not performed with this format.   I connected with Virginia Young and her caregiver Virginia Young on 07/01/2019 at 1450 by telephone and verified that I am speaking with the correct person using two identifiers. Virginia Young is currently located at home and caregiver is currently with them during visit. The provider, Monia Pouch, FNP is located in their office at time of visit.  I discussed the limitations, risks, security and privacy concerns of performing an evaluation and management service by telephone and the availability of in person appointments. I also discussed with the patient that there may be a patient responsible charge related to this service. The patient expressed understanding and agreed to proceed.  Subjective:  Patient ID: Virginia Young, female    DOB: 1941-01-22, 79 y.o.   MRN: [id number]  Chief Complaint:  Urinary Tract Infection   HPI: Virginia Young is a 79 y.o. female presenting on 07/01/2019 for Urinary Tract Infection   Urinary Tract Infection  This is a recurrent problem. The current episode started in the past 7 days. The problem occurs every urination. The problem has been gradually worsening. The quality of the pain is described as aching and burning. The pain is at a severity of 4/10. The pain is mild. There has been no fever. She is not sexually active. There is no history of pyelonephritis. Associated symptoms include frequency and urgency. Pertinent negatives include no chills, discharge, flank pain, hematuria, hesitancy, nausea,  possible pregnancy, sweats or vomiting. Associated symptoms comments: Malodorous urine. She has tried nothing for the symptoms. Her past medical history is significant for recurrent UTIs.     Relevant past medical, surgical, family, and social history reviewed and updated as indicated.  Allergies and medications reviewed and updated.   Past Medical History:  Diagnosis Date  . Allergy   . Atrial fibrillation (HCC)    coumadin  . Cataract   . Chest pain 03/2012   a. Lex MV 10/13:  EF 64%, dist ant and apical defect sugg of soft tissue atten, no ischemia  . Chronic systolic heart failure ([place])   . Depression   . GERD (gastroesophageal reflux disease)   . HLD (hyperlipidemia)   . Mild mitral regurgitation   . MS (multiple sclerosis) ([place])   . NICM (nonischemic cardiomyopathy) ([place])    a. neg CLite in 2003;  b. EF 40-45% in past;   c.  Echo 12/11: EF 35-40%, mild MR, mild LAE, mild RAE, small pericardial effusion   . Osteoarthritis   . Osteoporosis   . Stasis ulcer ([place])   . Vaginal prolapse without uterine prolapse   . Varicose veins     Past Surgical History:  Procedure Laterality Date  . ANTERIOR AND POSTERIOR VAGINAL REPAIR W/ SACROSPINOUS LIGAMENT SUSPENSION  2016   [facility]  . BLADDER SURGERY    . CARPAL TUNNEL RELEASE Right 08/22/2017   Procedure: CARPAL TUNNEL RELEASE;  Surgeon: [name], MD;  Location: [facility];  Service: Orthopedics;  Laterality: Right;  . cataract surgery Bilateral   . EYE SURGERY    . INGUINAL HERNIA REPAIR  right  . LIPOMA EXCISION     h/o removed from upper back x 2  . REVERSE SHOULDER ARTHROPLASTY Right 08/22/2017   Procedure: REVERSE SHOULDER ARTHROPLASTY;  Surgeon: [name], MD;  Location: [facility];  Service: Orthopedics;  Laterality: Right;    Social History   Socioeconomic History  . Marital status: Divorced    Spouse name: Not on file  . Number of children: 3  . Years of education: 73  . Highest education level: Not on file    Occupational History  . Occupation: retired    Fish farm manager: RETIRED  Tobacco Use  . Smoking status: Never Smoker  . Smokeless tobacco: Never Used  Substance and Sexual Activity  . Alcohol use: No  . Drug use: No  . Sexual activity: Not Currently  Other Topics Concern  . Not on file  Social History Narrative   DESIGNATED PARTY RELEASE ON FILE. Fleet Contras, April 26, 2010 10:09 AM.      Lives at home alone. She has a caregiver Monday through Friday during the day   Right-handed   Caffeine: 1-2 cup of coffee daily   Social Determinants of Health   Financial Resource Strain:   . Difficulty of Paying Living Expenses: Not on file  Food Insecurity:   . Worried About Charity fundraiser in the Last Year: Not on file  . Ran Out of Food in the Last Year: Not on file  Transportation Needs:   . Lack of Transportation (Medical): Not on file  . Lack of Transportation (Non-Medical): Not on file  Physical Activity:   . Days of Exercise per Week: Not on file  . Minutes of Exercise per Session: Not on file  Stress:   . Feeling of Stress : Not on file  Social Connections:   . Frequency of Communication with Friends and Family: Not on file  . Frequency of Social Gatherings with Friends and Family: Not on file  . Attends Religious Services: Not on file  . Active Member of Clubs or Organizations: Not on file  . Attends Archivist Meetings: Not on file  . Marital Status: Not on file  Intimate Partner Violence:   . Fear of Current or Ex-Partner: Not on file  . Emotionally Abused: Not on file  . Physically Abused: Not on file  . Sexually Abused: Not on file    Outpatient Encounter Medications as of 07/01/2019  Medication Sig  . cephALEXin (KEFLEX) 500 MG capsule Take 1 capsule (500 mg total) by mouth 2 (two) times daily.  . [facility] D3 25 MCG (1000 UT) capsule Take 1 capsule (1,000 Units total) by mouth daily.  . divalproex (DEPAKOTE ER) 250 MG 24 hr tablet Take 1 tablet (250 mg  total) by mouth daily. Must keep pending appointment for further refills.  Marland Kitchen ELIQUIS 5 MG TABS tablet Take 1 tablet (5 mg total) by mouth every 12 (twelve) hours.  . furosemide (LASIX) 20 MG tablet Take 60 mg by mouth.  . linaclotide (LINZESS) 145 MCG CAPS capsule TAKE 1 CAPSULE (145 MCG TOTAL) BY MOUTH DAILY BEFORE BREAKFAST.  Marland Kitchen losartan (COZAAR) 25 MG tablet Take 12.5 mg by mouth daily.  . Multiple Vitamin (MULTIVITAMIN) tablet Take 1 tablet by mouth daily.  . QUEtiapine (SEROQUEL) 50 MG tablet TAKE 1 TABLET BY MOUTH TWICE A DAY   No facility-administered encounter medications on file as of 07/01/2019.    Allergies  Allergen Reactions  . Bactrim [Sulfamethoxazole-Trimethoprim]     Mouth sores  .  Lipitor [Atorvastatin Calcium]     myalgia  . Lisinopril     cough    Review of Systems  Constitutional: Negative for activity change, appetite change, chills, diaphoresis, fatigue, fever and unexpected weight change.  HENT: Negative.   Eyes: Negative.  Negative for photophobia and visual disturbance.  Respiratory: Negative for cough, chest tightness and shortness of breath.   Cardiovascular: Negative for chest pain, palpitations and leg swelling.  Gastrointestinal: Negative for abdominal pain, blood in stool, constipation, diarrhea, nausea and vomiting.  Endocrine: Negative.   Genitourinary: Positive for dysuria, frequency and urgency. Negative for decreased urine volume, difficulty urinating, flank pain, hematuria and hesitancy.  Musculoskeletal: Negative for arthralgias, back pain and myalgias.  Skin: Negative.   Allergic/Immunologic: Negative.   Neurological: Negative for dizziness, weakness and headaches.  Hematological: Negative.   Psychiatric/Behavioral: Negative for confusion, hallucinations, sleep disturbance and suicidal ideas.  All other systems reviewed and are negative.        Observations/Objective: No vital signs or physical exam, this was a telephone or virtual  health encounter.  Pt alert and oriented, answers all questions appropriately, and able to speak in full sentences.    Assessment and Plan: Kataleena was seen today for urinary tract infection.  Diagnoses and all orders for this visit:  UTI symptoms Frequent UTI Pt with a history of frequent UTI's reports dysuria, frequency, urgency, and malodorous urine. Due to recurrent UTI and antibiotic resistance, pt will bring sample to office for analysis and culture. Will initiate therapy if culture warrants. Pt to increase water intake and avoid bladder irritants such as caffeine.  -     urinalysis- dip and micro; Future -     Urine culture; Future     Follow Up Instructions: Return if symptoms worsen or fail to improve.    I discussed the assessment and treatment plan with the patient. The patient was provided an opportunity to ask questions and all were answered. The patient agreed with the plan and demonstrated an understanding of the instructions.   The patient was advised to call back or seek an in-person evaluation if the symptoms worsen or if the condition fails to improve as anticipated.  The above assessment and management plan was discussed with the patient. The patient verbalized understanding of and has agreed to the management plan. Patient is aware to call the clinic if they develop any new symptoms or if symptoms persist or worsen. Patient is aware when to return to the clinic for a follow-up visit. Patient educated on when it is appropriate to go to the emergency department.    I provided 15 minutes of non-face-to-face time during this encounter. The call started at 1450. The call ended at 1505. The other time was used for coordination of care.    Monia Pouch, FNP-C [facility] [address] [place], [place] [address] [phone] 07/01/2019

## 2019-07-02 ENCOUNTER — Other Ambulatory Visit: Payer: Medicare Other

## 2019-07-02 ENCOUNTER — Other Ambulatory Visit: Payer: Self-pay

## 2019-07-02 ENCOUNTER — Other Ambulatory Visit: Payer: Self-pay | Admitting: Neurology

## 2019-07-02 DIAGNOSIS — R399 Unspecified symptoms and signs involving the genitourinary system: Secondary | ICD-10-CM | POA: Diagnosis not present

## 2019-07-02 DIAGNOSIS — N390 Urinary tract infection, site not specified: Secondary | ICD-10-CM | POA: Diagnosis not present

## 2019-07-02 DIAGNOSIS — N3001 Acute cystitis with hematuria: Secondary | ICD-10-CM

## 2019-07-02 LAB — URINALYSIS, COMPLETE
Bilirubin, UA: NEGATIVE
Glucose, UA: NEGATIVE
Ketones, UA: NEGATIVE
Nitrite, UA: POSITIVE — AB
Protein,UA: NEGATIVE
Specific Gravity, UA: 1.015 (ref 1.005–1.030)
Urobilinogen, Ur: 0.2 mg/dL (ref 0.2–1.0)
pH, UA: 7 (ref 5.0–7.5)

## 2019-07-02 LAB — MICROSCOPIC EXAMINATION: WBC, UA: >30 /hpf — AB (ref 0–5)

## 2019-07-04 LAB — URINE CULTURE

## 2019-07-05 ENCOUNTER — Telehealth: Payer: Self-pay | Admitting: Family Medicine

## 2019-07-05 MED ORDER — CIPROFLOXACIN HCL 500 MG PO TABS: 500.0000 mg | ORAL_TABLET | Freq: Two times a day (BID) | ORAL | 0 refills | Status: AC

## 2019-07-05 NOTE — Telephone Encounter (Signed)
Patient aware and verbalized understanding. °

## 2019-07-05 NOTE — Telephone Encounter (Signed)
I think she should reschedule the vaccine, try to wait at least 14 days post treatment.

## 2019-07-09 ENCOUNTER — Ambulatory Visit: Payer: Medicare Other

## 2019-07-15 ENCOUNTER — Telehealth: Payer: Self-pay | Admitting: *Deleted

## 2019-07-15 NOTE — Telephone Encounter (Signed)
A message was left, re: her follow up visit. 

## 2019-07-18 NOTE — Progress Notes (Signed)
Cardiology Office Note   Date:  07/19/2019   ID:  Virginia Young, DOB Oct 24, 1940, MRN [id number]  PCP:  Baruch Gouty, FNP  Cardiologist:   Minus Breeding, MD  Chief Complaint  Patient presents with  . Shortness of Breath      History of Present Illness: Virginia Young is a 80 y.o. female who presents for follow up of atrial fibrillation and nonischemic cardiomyopathy.  She has a mildly reduced ejection fraction of 40% in 04/2016.  She has had orthostatic hypotension.  She had PFTs.    This demonstrated a diffusion defect.     She has continued to have increased dyspnea.  At a previous visit I ordered home O2.  However, for some reason she did not qualify for this.  I will send her to pulmonary and she has some kyphoscoliosis and it was thought that this in her heart failure likely more the cause of her shortness of breath.  We have increased her Lasix.  She is not had significant improvement in her breathing.  She has not tolerated beta-blockers in the past.  She has not tolerated higher dose of ACE inhibitor's or ARB's.  She has low blood pressure.  She has lots of subjective complaints to med titration as well.  She is not having any new swelling.  She is trying to watch salt.  She is not having any new swelling.  She gets some mild left greater than right leg swelling.  She is not describing new PND or orthopnea.  She is in atrial fibrillation but tolerates rate control and anticoagulation.   Past Medical History:  Diagnosis Date  . Allergy   . Atrial fibrillation (HCC)    coumadin  . Cataract   . Chest pain 03/2012   a. Lex MV 10/13:  EF 64%, dist ant and apical defect sugg of soft tissue atten, no ischemia  . Chronic systolic heart failure ([place])   . Depression   . GERD (gastroesophageal reflux disease)   . HLD (hyperlipidemia)   . Mild mitral regurgitation   . MS (multiple sclerosis) ([place])   . NICM (nonischemic cardiomyopathy) ([place])    a. neg CLite in 2003;  b.  EF 40-45% in past;   c.  Echo 12/11: EF 35-40%, mild MR, mild LAE, mild RAE, small pericardial effusion   . Osteoarthritis   . Osteoporosis   . Stasis ulcer ([place])   . Vaginal prolapse without uterine prolapse   . Varicose veins     Past Surgical History:  Procedure Laterality Date  . ANTERIOR AND POSTERIOR VAGINAL REPAIR W/ SACROSPINOUS LIGAMENT SUSPENSION  2016   [facility]  . BLADDER SURGERY    . CARPAL TUNNEL RELEASE Right 08/22/2017   Procedure: CARPAL TUNNEL RELEASE;  Surgeon: [name], MD;  Location: [facility];  Service: Orthopedics;  Laterality: Right;  . cataract surgery Bilateral   . EYE SURGERY    . INGUINAL HERNIA REPAIR     right  . LIPOMA EXCISION     h/o removed from upper back x 2  . REVERSE SHOULDER ARTHROPLASTY Right 08/22/2017   Procedure: REVERSE SHOULDER ARTHROPLASTY;  Surgeon: [name], MD;  Location: [facility];  Service: Orthopedics;  Laterality: Right;     Current Outpatient Medications  Medication Sig Dispense Refill  . cephALEXin (KEFLEX) 500 MG capsule Take 1 capsule (500 mg total) by mouth 2 (two) times daily. 14 capsule 0  . [facility]S D3 25 MCG (1000 UT) capsule Take  1 capsule (1,000 Units total) by mouth daily. 90 capsule 1  . divalproex (DEPAKOTE ER) 250 MG 24 hr tablet Take 1 tablet (250 mg total) by mouth daily. Must keep pending appointment for further refills. 30 tablet 2  . ELIQUIS 5 MG TABS tablet Take 1 tablet (5 mg total) by mouth every 12 (twelve) hours. 60 tablet 1  . furosemide (LASIX) 20 MG tablet Take 60 mg by mouth.    . linaclotide (LINZESS) 145 MCG CAPS capsule TAKE 1 CAPSULE (145 MCG TOTAL) BY MOUTH DAILY BEFORE BREAKFAST. 90 capsule 0  . losartan (COZAAR) 25 MG tablet Take 12.5 mg by mouth daily.    . metoprolol tartrate (LOPRESSOR) 25 MG tablet Take 0.5 tablets (12.5 mg total) by mouth 2 (two) times daily. 90 tablet 3  . Multiple Vitamin (MULTIVITAMIN) tablet Take 1 tablet by mouth daily.    . QUEtiapine (SEROQUEL) 50 MG tablet TAKE 1 TABLET BY  MOUTH TWICE A DAY 180 tablet 3   No current facility-administered medications for this visit.    Allergies:   Bactrim [sulfamethoxazole-trimethoprim], Lipitor [atorvastatin calcium], and Lisinopril    ROS:  Please see the history of present illness.   Otherwise, review of systems are positive for none.   All other systems are reviewed and negative.    PHYSICAL EXAM: VS:  BP 110/82   Pulse 100   Temp (!) 97.3 F (36.3 C)   Ht 5\' 10"  (1.778 m)   Wt 212 lb 6.4 oz (96.3 kg)   SpO2 95%   BMI 30.48 kg/m  , BMI Body mass index is 30.48 kg/m. GENERAL:  Well appearing HEENT:  Pupils equal round and reactive, fundi not visualized, oral mucosa unremarkable NECK:  No jugular venous distention, waveform within normal limits, carotid upstroke brisk and symmetric, no bruits, no thyromegaly LYMPHATICS:  No cervical, inguinal adenopathy LUNGS:  Clear to auscultation bilaterally BACK:  No CVA tenderness CHEST:  Unremarkable HEART:  PMI not displaced or sustained,S1 and S2 within normal limits, no S3, no clicks, no rubs, no murmurs ABD:  Flat, positive bowel sounds normal in frequency in pitch, no bruits, no rebound, no guarding, no midline pulsatile mass, no hepatomegaly, no splenomegaly EXT:  2 plus pulses throughout, no edema, no cyanosis no clubbing SKIN:  No rashes no nodules NEURO:  Cranial nerves II through XII grossly intact, motor grossly intact throughout PSYCH:  Cognitively intact, oriented to person place and time    EKG:  EKG is ordered today. The ekg ordered today demonstrates atrial fibrillation, rate 100, axis within normal limits, intervals within normal limits, no acute ST-T wave changes.   Recent Labs: 08/05/2018: BNP 152.9 05/24/2019: ALT 20; BUN 12; Creatinine, Ser 0.97; Hemoglobin 14.7; Platelets 248; Potassium 4.0; Sodium 141    Lipid Panel    Component Value Date/Time   CHOL 196 05/15/2015 0827   TRIG 74 05/15/2015 0827   HDL 73 05/15/2015 0827   CHOLHDL 2.7  05/15/2015 0827   CHOLHDL 3 03/22/2013 1126   VLDL 17.8 03/22/2013 1126   LDLCALC 108 (H) 05/15/2015 0827      Wt Readings from Last 3 Encounters:  07/19/19 212 lb 6.4 oz (96.3 kg)  05/24/19 210 lb 9.6 oz (95.5 kg)  05/07/19 195 lb (88.5 kg)     SATURATION QUALIFICATIONS: (This note is used to comply with regulatory documentation for home oxygen)  Patient Saturations on Room Air at Rest = 95%  Patient Saturations on Room Air while Ambulating = 85%  Patient Saturations on 2 Liters of oxygen while Ambulating = 95%  Please briefly explain why patient needs home oxygen:  Hypoxemia not explained by her cardiomyopathy.   Other studies Reviewed: Additional studies/ records that were reviewed today include: O2 saturations.. Review of the above records demonstrates:  Please see elsewhere in the note.     ASSESSMENT AND PLAN:  NICM:    The patient remains dyspneic.  We will try again to get her home oxygen.  I am going to try again to get her on a low-dose of beta-blocker.  I like to see her back in a month and if she is not tolerating medications or not any better she will need a right heart catheterization.  I will also check today basic metabolic profile and TSH.     Atrial fib:   Ms. ELLENOR GOTTO has a CHA2DS2 - VASc score of 3.  Tolerates rate control and anticoagulation.  No change in therapy.  She is had no bleeding issues.   Covid education: Patient will be signing up when able to get the vaccine.    Current medicines are reviewed at length with the patient today.  The patient does not have concerns regarding medicines.  The following changes have been made:  As above  Labs/ tests ordered today include:   Orders Placed This Encounter  Procedures  . For home use only DME oxygen  . CBC  . Basic metabolic panel  . EKG 12-Lead     Disposition:   FU with APP in one month    Signed, Minus Breeding, MD  07/19/2019 10:29 AM    [facility]

## 2019-07-19 ENCOUNTER — Other Ambulatory Visit: Payer: Self-pay

## 2019-07-19 ENCOUNTER — Encounter: Payer: Self-pay | Admitting: Cardiology

## 2019-07-19 ENCOUNTER — Ambulatory Visit (INDEPENDENT_AMBULATORY_CARE_PROVIDER_SITE_OTHER): Payer: Medicare Other | Admitting: Cardiology

## 2019-07-19 VITALS — BP 110/82 | HR 100 | Temp 97.3°F | Ht 70.0 in | Wt 212.4 lb

## 2019-07-19 DIAGNOSIS — I482 Chronic atrial fibrillation, unspecified: Secondary | ICD-10-CM | POA: Diagnosis not present

## 2019-07-19 DIAGNOSIS — Z7189 Other specified counseling: Secondary | ICD-10-CM

## 2019-07-19 DIAGNOSIS — I5022 Chronic systolic (congestive) heart failure: Secondary | ICD-10-CM | POA: Diagnosis not present

## 2019-07-19 DIAGNOSIS — R0600 Dyspnea, unspecified: Secondary | ICD-10-CM | POA: Diagnosis not present

## 2019-07-19 LAB — CBC
Hematocrit: 43.6 % (ref 34.0–46.6)
Hemoglobin: 14.6 g/dL (ref 11.1–15.9)
MCH: 28.7 pg (ref 26.6–33.0)
MCHC: 33.5 g/dL (ref 31.5–35.7)
MCV: 86 fL (ref 79–97)
Platelets: 258 10*3/uL (ref 150–450)
RBC: 5.08 x10E6/uL (ref 3.77–5.28)
RDW: 13.2 % (ref 11.7–15.4)
WBC: 9.4 10*3/uL (ref 3.4–10.8)

## 2019-07-19 LAB — BASIC METABOLIC PANEL WITH GFR
BUN/Creatinine Ratio: 14 (ref 12–28)
BUN: 13 mg/dL (ref 8–27)
CO2: 26 mmol/L (ref 20–29)
Calcium: 8.5 mg/dL — ABNORMAL LOW (ref 8.7–10.3)
Chloride: 100 mmol/L (ref 96–106)
Creatinine, Ser: 0.92 mg/dL (ref 0.57–1.00)
GFR calc Af Amer: 68 mL/min/{1.73_m2}
GFR calc non Af Amer: 59 mL/min/{1.73_m2} — ABNORMAL LOW
Glucose: 70 mg/dL (ref 65–99)
Potassium: 4.1 mmol/L (ref 3.5–5.2)
Sodium: 138 mmol/L (ref 134–144)

## 2019-07-19 MED ORDER — METOPROLOL TARTRATE 25 MG PO TABS: 12.5000 mg | ORAL_TABLET | Freq: Two times a day (BID) | ORAL | 3 refills | Status: DC

## 2019-07-19 NOTE — Patient Instructions (Signed)
Medication Instructions:  Metoprolol tartrate 12.5mg  twice a day *If you need a refill on your cardiac medications before your next appointment, please call your pharmacy*  Lab Work: Your physician recommends that you return for lab work today (BMP, CBC)  If you have labs (blood work) drawn today and your tests are completely normal, you will receive your results only by: Marland Kitchen MyChart Message (if you have MyChart) OR . A paper copy in the mail If you have any lab test that is abnormal or we need to change your treatment, we will call you to review the results.  Testing/Procedures: None  Follow-Up: At [facility], you and your health needs are our priority.  As part of our continuing mission to provide you with exceptional heart care, we have created designated Provider Care Teams.  These Care Teams include your primary Cardiologist (physician) and Advanced Practice Providers (APPs -  Physician Assistants and Nurse Practitioners) who all work together to provide you with the care you need, when you need it.  Your next appointment:   1 month(s)  The format for your next appointment:   In Person  Provider:   You may see one of the following Advanced Practice Providers on your designated Care Team:    Rosaria Ferries, PA-C  Jory Sims, DNP, ANP    Other Instructions Order placed for oxygen with Huey Romans

## 2019-07-19 NOTE — Progress Notes (Signed)
Patient was 95% on RA resting Patient 85% on room air ambulating Patient 95% on 2L ambulating.

## 2019-07-20 ENCOUNTER — Telehealth: Payer: Self-pay | Admitting: Cardiology

## 2019-07-20 NOTE — Telephone Encounter (Signed)
Left a message for the patient to call back.  

## 2019-07-20 NOTE — Telephone Encounter (Signed)
Pt c/o medication issue:  1. Name of Medication: metoprolol tartrate (LOPRESSOR) 25 MG tablet  2. How are you currently taking this medication (dosage and times per day)? As directed  3. Are you having a reaction (difficulty breathing--STAT)? no  4. What is your medication issue? Christine, patients caregiver, is calling in because the patient is to take 0.5 table of metoprolol twice daily. Altha Harm states that the pill is really small and is hard to cut evenly in half. She wants to know the difference between taking 0.5 tablet twice daily and just taking one table once daily.

## 2019-07-21 MED ORDER — METOPROLOL SUCCINATE ER 25 MG PO TB24: 25.0000 mg | ORAL_TABLET | Freq: Every day | ORAL | 3 refills | Status: DC

## 2019-07-21 NOTE — Telephone Encounter (Signed)
OK to change to metoprolol XL 25 mg daily.

## 2019-07-21 NOTE — Telephone Encounter (Signed)
Pts caregiver, Altha Harm on [place], called to report that the pt is taking Lopressor 25 mg 1/2 bid and they are having a hard time cutting the small tablet in half... she is asking if she can change her to the Toprol XL to take once daily.  Will forward to Dr. Percival Spanish for review.

## 2019-07-21 NOTE — Telephone Encounter (Signed)
Christine advised [facility] [place].

## 2019-07-24 ENCOUNTER — Other Ambulatory Visit: Payer: Self-pay | Admitting: Neurology

## 2019-08-05 ENCOUNTER — Ambulatory Visit: Payer: Medicare Other | Admitting: Family Medicine

## 2019-08-09 DIAGNOSIS — Z23 Encounter for immunization: Secondary | ICD-10-CM | POA: Diagnosis not present

## 2019-08-10 ENCOUNTER — Ambulatory Visit: Payer: Medicare Other | Admitting: Family Medicine

## 2019-08-10 ENCOUNTER — Other Ambulatory Visit: Payer: Self-pay

## 2019-08-11 ENCOUNTER — Encounter: Payer: Self-pay | Admitting: Family Medicine

## 2019-08-11 ENCOUNTER — Ambulatory Visit (INDEPENDENT_AMBULATORY_CARE_PROVIDER_SITE_OTHER): Payer: Medicare Other

## 2019-08-11 ENCOUNTER — Other Ambulatory Visit: Payer: Self-pay

## 2019-08-11 ENCOUNTER — Ambulatory Visit (INDEPENDENT_AMBULATORY_CARE_PROVIDER_SITE_OTHER): Payer: Medicare Other | Admitting: Family Medicine

## 2019-08-11 ENCOUNTER — Ambulatory Visit: Payer: Medicare Other | Admitting: Family Medicine

## 2019-08-11 VITALS — BP 99/68 | HR 81 | Temp 97.3°F | Resp 20 | Ht 70.0 in | Wt 209.0 lb

## 2019-08-11 DIAGNOSIS — F0281 Dementia in other diseases classified elsewhere with behavioral disturbance: Secondary | ICD-10-CM | POA: Diagnosis not present

## 2019-08-11 DIAGNOSIS — G300 Alzheimer's disease with early onset: Secondary | ICD-10-CM

## 2019-08-11 DIAGNOSIS — Z13820 Encounter for screening for osteoporosis: Secondary | ICD-10-CM | POA: Diagnosis not present

## 2019-08-11 DIAGNOSIS — M810 Age-related osteoporosis without current pathological fracture: Secondary | ICD-10-CM

## 2019-08-11 DIAGNOSIS — F02818 Dementia in other diseases classified elsewhere, unspecified severity, with other behavioral disturbance: Secondary | ICD-10-CM

## 2019-08-11 DIAGNOSIS — Z780 Asymptomatic menopausal state: Secondary | ICD-10-CM

## 2019-08-11 DIAGNOSIS — I10 Essential (primary) hypertension: Secondary | ICD-10-CM

## 2019-08-11 DIAGNOSIS — I480 Paroxysmal atrial fibrillation: Secondary | ICD-10-CM | POA: Diagnosis not present

## 2019-08-11 DIAGNOSIS — I5022 Chronic systolic (congestive) heart failure: Secondary | ICD-10-CM

## 2019-08-11 NOTE — Progress Notes (Signed)
Subjective:  Patient ID: Virginia Young, female    DOB: July 17, 1940, 79 y.o.   MRN: [id number]  Patient Care Team: Baruch Gouty, FNP as PCP - General (Family Medicine) Minus Breeding, MD as PCP - Cardiology (Cardiology) Fay Records, MD (Cardiology)   Chief Complaint:  Medical Management of Chronic Issues (CHF , dementia )   HPI: Virginia Young is a 79 y.o. female presenting on 08/11/2019 for Medical Management of Chronic Issues (CHF , dementia )   1. Chronic systolic HF (heart failure) (HCC) Pt has been doing well. No weight changes. Is staying active in the home. She was recently seen by cardiology and ordered home oxygen as needed for exertional hypoxia. Pt and caregiver report she uses this when needed with exertion and is tolerating well. States her resting oxygen saturation resting ranges from 97-99% on room air. States she will drop to the high 80's with exertion but this returns to normal when resting. No increased SHOB, lower extremity edema, or fatigue. No PND or orthopnea.   2. Essential hypertension Complaint with meds - Yes Current Medications - lasix, metoprolol, losartan Checking BP at home averaging 110/60 - 120/70 Exercising Regularly - No Watching Salt intake - Yes Pertinent ROS:  Headache - No Fatigue - No Visual Disturbances - No Chest pain - No Dyspnea - No Palpitations - No LE edema - Yes They report good compliance with medications and can restate their regimen by memory. No medication side effects.  Family, social, and smoking history reviewed.   BP Readings from Last 3 Encounters:  08/11/19 99/68  07/19/19 110/82  05/24/19 117/77   CMP Latest Ref Rng & Units 07/19/2019 05/24/2019 05/07/2019  Glucose 65 - 99 mg/dL 70 92 99  BUN 8 - 27 mg/dL 13 12 21   Creatinine 0.57 - 1.00 mg/dL 0.92 0.97 1.01(H)  Sodium 134 - 144 mmol/L 138 141 138  Potassium 3.5 - 5.2 mmol/L 4.1 4.0 3.7  Chloride 96 - 106 mmol/L 100 102 100  CO2 20 - 29 mmol/L 26  26 30   Calcium 8.7 - 10.3 mg/dL 8.5(L) 8.8 8.9  Total Protein 6.0 - 8.5 g/dL - 6.7 7.2  Total Bilirubin 0.0 - 1.2 mg/dL - 0.6 0.6  Alkaline Phos 39 - 117 IU/L - 126(H) 105  AST 0 - 40 IU/L - 21 23  ALT 0 - 32 IU/L - 20 23      3. Paroxysmal atrial fibrillation (HCC) Pt has been well controlled on metoprolol. She is followed by cardiology on a regular basis. She is on Eliquis. Last EKG A-Fib rate of 100. No abnormal bleeding or bruising with Eliquis. No dizziness, palpitations, shortness of breath, chest pain, or syncope.   4. Early onset Alzheimer's disease with behavioral disturbance ([place]) Pt has help in the home. Caregiver is with her today and states pt is doing well. No new or worsening symptoms. She is pleasant and cooperative today.       Relevant past medical, surgical, family, and social history reviewed and updated as indicated.  Allergies and medications reviewed and updated. Date reviewed: Chart in Epic.   Past Medical History:  Diagnosis Date  . Allergy   . Atrial fibrillation (HCC)    coumadin  . Cataract   . Chest pain 03/2012   a. Lex MV 10/13:  EF 64%, dist ant and apical defect sugg of soft tissue atten, no ischemia  . Chronic systolic heart failure ([place])   . Depression   .  GERD (gastroesophageal reflux disease)   . HLD (hyperlipidemia)   . Mild mitral regurgitation   . MS (multiple sclerosis) ([place])   . NICM (nonischemic cardiomyopathy) ([place])    a. neg CLite in 2003;  b. EF 40-45% in past;   c.  Echo 12/11: EF 35-40%, mild MR, mild LAE, mild RAE, small pericardial effusion   . Osteoarthritis   . Osteoporosis   . Stasis ulcer ([place])   . Vaginal prolapse without uterine prolapse   . Varicose veins     Past Surgical History:  Procedure Laterality Date  . ANTERIOR AND POSTERIOR VAGINAL REPAIR W/ SACROSPINOUS LIGAMENT SUSPENSION  2016   [facility]  . BLADDER SURGERY    . CARPAL TUNNEL RELEASE Right 08/22/2017   Procedure: CARPAL TUNNEL RELEASE;  Surgeon: [name], MD;  Location: [facility];  Service: Orthopedics;  Laterality: Right;  . cataract surgery Bilateral   . EYE SURGERY    . INGUINAL HERNIA REPAIR     right  . LIPOMA EXCISION     h/o removed from upper back x 2  . REVERSE SHOULDER ARTHROPLASTY Right 08/22/2017   Procedure: REVERSE SHOULDER ARTHROPLASTY;  Surgeon: [name], MD;  Location: [facility];  Service: Orthopedics;  Laterality: Right;    Social History   Socioeconomic History  . Marital status: Divorced    Spouse name: Not on file  . Number of children: 3  . Years of education: 20  . Highest education level: Not on file  Occupational History  . Occupation: retired    Fish farm manager: RETIRED  Tobacco Use  . Smoking status: Never Smoker  . Smokeless tobacco: Never Used  Substance and Sexual Activity  . Alcohol use: No  . Drug use: No  . Sexual activity: Not Currently  Other Topics Concern  . Not on file  Social History Narrative   DESIGNATED PARTY RELEASE ON FILE. Fleet Contras, April 26, 2010 10:09 AM.      Lives at home alone. She has a caregiver Monday through Friday during the day   Right-handed   Caffeine: 1-2 cup of coffee daily   Social Determinants of Health   Financial Resource Strain:   . Difficulty of Paying Living Expenses: Not on file  Food Insecurity:   . Worried About Charity fundraiser in the Last Year: Not on file  . Ran Out of Food in the Last Year: Not on file  Transportation Needs:   . Lack of Transportation (Medical): Not on file  . Lack of Transportation (Non-Medical): Not on file  Physical Activity:   . Days of Exercise per Week: Not on file  . Minutes of Exercise per Session: Not on file  Stress:   . Feeling of Stress : Not on file  Social Connections:   . Frequency of Communication with Friends and Family: Not on file  . Frequency of Social Gatherings with Friends and Family: Not on file  . Attends Religious Services: Not on file  . Active Member of Clubs or Organizations: Not on file    . Attends Archivist Meetings: Not on file  . Marital Status: Not on file  Intimate Partner Violence:   . Fear of Current or Ex-Partner: Not on file  . Emotionally Abused: Not on file  . Physically Abused: Not on file  . Sexually Abused: Not on file    Outpatient Encounter Medications as of 08/11/2019  Medication Sig  . [employer] D3 25 MCG (1000 UT) capsule Take 1 capsule (  1,000 Units total) by mouth daily.  . divalproex (DEPAKOTE ER) 250 MG 24 hr tablet TAKE 1 TABLET (250 MG TOTAL) BY MOUTH DAILY. MUST KEEP PENDING APPOINTMENT FOR FURTHER REFILLS.  . furosemide (LASIX) 20 MG tablet Take 60 mg by mouth.  . linaclotide (LINZESS) 145 MCG CAPS capsule TAKE 1 CAPSULE (145 MCG TOTAL) BY MOUTH DAILY BEFORE BREAKFAST.  Marland Kitchen losartan (COZAAR) 25 MG tablet Take 12.5 mg by mouth daily.  . metoprolol succinate (TOPROL XL) 25 MG 24 hr tablet Take 1 tablet (25 mg total) by mouth daily.  . Multiple Vitamin (MULTIVITAMIN) tablet Take 1 tablet by mouth daily.  . OXYGEN Inhale into the lungs. 2 liters PRN - anytime when needed ( cardio rx'd)  . QUEtiapine (SEROQUEL) 50 MG tablet TAKE 1 TABLET BY MOUTH TWICE A DAY  . [DISCONTINUED] ELIQUIS 5 MG TABS tablet Take 1 tablet (5 mg total) by mouth every 12 (twelve) hours.  . [DISCONTINUED] cephALEXin (KEFLEX) 500 MG capsule Take 1 capsule (500 mg total) by mouth 2 (two) times daily.   No facility-administered encounter medications on file as of 08/11/2019.    Allergies  Allergen Reactions  . Bactrim [Sulfamethoxazole-Trimethoprim]     Mouth sores  . Lipitor [Atorvastatin Calcium]     myalgia  . Lisinopril     cough    Review of Systems  Constitutional: Negative for activity change, appetite change, chills, diaphoresis, fatigue, fever and unexpected weight change.  HENT: Negative.   Eyes: Negative.  Negative for photophobia and visual disturbance.  Respiratory: Negative for cough, chest tightness and shortness of breath.   Cardiovascular: Positive  for leg swelling. Negative for chest pain and palpitations.  Gastrointestinal: Negative for abdominal distention, abdominal pain, blood in stool, constipation, diarrhea, nausea and vomiting.  Endocrine: Negative.  Negative for cold intolerance, heat intolerance, polydipsia, polyphagia and polyuria.  Genitourinary: Negative for decreased urine volume, difficulty urinating, dysuria, frequency and urgency.  Musculoskeletal: Negative for arthralgias and myalgias.  Skin: Negative.   Allergic/Immunologic: Negative.   Neurological: Negative for dizziness, tremors, seizures, syncope, facial asymmetry, speech difficulty, weakness, light-headedness, numbness and headaches.  Hematological: Negative.  Does not bruise/bleed easily.  Psychiatric/Behavioral: Positive for agitation, behavioral problems and confusion. Negative for decreased concentration, dysphoric mood, hallucinations, self-injury, sleep disturbance and suicidal ideas. The patient is not nervous/anxious and is not hyperactive.   All other systems reviewed and are negative.       Objective:  BP 99/68   Pulse 81   Temp (!) 97.3 F (36.3 C)   Resp 20   Ht 5\' 10"  (1.778 m)   Wt 209 lb (94.8 kg)   SpO2 98%   BMI 29.99 kg/m    Wt Readings from Last 3 Encounters:  08/11/19 209 lb (94.8 kg)  07/19/19 212 lb 6.4 oz (96.3 kg)  05/24/19 210 lb 9.6 oz (95.5 kg)    Physical Exam Vitals and nursing note reviewed.  Constitutional:      General: She is not in acute distress.    Appearance: Normal appearance. She is well-developed and well-groomed. She is not ill-appearing, toxic-appearing or diaphoretic.  HENT:     Head: Normocephalic and atraumatic.     Jaw: There is normal jaw occlusion.     Right Ear: Hearing normal.     Left Ear: Hearing normal.     Nose: Nose normal.     Mouth/Throat:     Lips: Pink.     Mouth: Mucous membranes are moist.     Pharynx:  Oropharynx is clear. Uvula midline.  Eyes:     General: Lids are normal.      Extraocular Movements: Extraocular movements intact.     Conjunctiva/sclera: Conjunctivae normal.     Pupils: Pupils are equal, round, and reactive to light.  Neck:     Thyroid: No thyroid mass, thyromegaly or thyroid tenderness.     Vascular: No carotid bruit or JVD.     Trachea: Trachea and phonation normal.  Cardiovascular:     Rate and Rhythm: Normal rate. Rhythm irregular.     Chest Wall: PMI is not displaced.     Pulses: Normal pulses.     Heart sounds: Normal heart sounds. No murmur. No friction rub. No gallop. No S3 sounds.   Pulmonary:     Effort: Pulmonary effort is normal. No respiratory distress.     Breath sounds: Normal breath sounds. No wheezing.  Abdominal:     General: Bowel sounds are normal. There is no distension or abdominal bruit.     Palpations: Abdomen is soft. There is no hepatomegaly or splenomegaly.     Tenderness: There is no abdominal tenderness. There is no right CVA tenderness or left CVA tenderness.     Hernia: No hernia is present.  Musculoskeletal:        General: Normal range of motion.     Cervical back: Normal range of motion and neck supple.     Right lower leg: 1+ Edema present.     Left lower leg: 1+ Edema present.  Lymphadenopathy:     Cervical: No cervical adenopathy.  Skin:    General: Skin is warm and dry.     Capillary Refill: Capillary refill takes less than 2 seconds.     Coloration: Skin is not cyanotic, jaundiced or pale.     Findings: No rash.  Neurological:     General: No focal deficit present.     Mental Status: She is alert. Mental status is at baseline.     Cranial Nerves: Cranial nerves are intact. No cranial nerve deficit.     Sensory: Sensation is intact. No sensory deficit.     Motor: Motor function is intact. No weakness.     Coordination: Coordination is intact. Coordination normal.     Gait: Gait is intact. Gait normal.     Deep Tendon Reflexes: Reflexes are normal and symmetric. Reflexes normal.  Psychiatric:          Attention and Perception: Attention and perception normal.        Mood and Affect: Mood and affect normal.        Speech: Speech normal.        Behavior: Behavior normal. Behavior is cooperative.        Thought Content: Thought content normal.        Cognition and Memory: Cognition and memory normal.        Judgment: Judgment normal.     Results for orders placed or performed in visit on 07/19/19  CBC  Result Value Ref Range   WBC 9.4 3.4 - 10.8 x10E3/uL   RBC 5.08 3.77 - 5.28 x10E6/uL   Hemoglobin 14.6 11.1 - 15.9 g/dL   Hematocrit 43.6 34.0 - 46.6 %   MCV 86 79 - 97 fL   MCH 28.7 26.6 - 33.0 pg   MCHC 33.5 31.5 - 35.7 g/dL   RDW 13.2 11.7 - 15.4 %   Platelets 258 150 - 450 [id number]  Basic metabolic panel  Result Value  Ref Range   Glucose 70 65 - 99 mg/dL   BUN 13 8 - 27 mg/dL   Creatinine, Ser 0.92 0.57 - 1.00 mg/dL   GFR calc non Af Amer 59 (L) >59 mL/min/1.73   GFR calc Af Amer 68 >59 mL/min/1.73   BUN/Creatinine Ratio 14 12 - 28   Sodium 138 134 - 144 mmol/L   Potassium 4.1 3.5 - 5.2 mmol/L   Chloride 100 96 - 106 mmol/L   CO2 26 20 - 29 mmol/L   Calcium 8.5 (L) 8.7 - 10.3 mg/dL       Pertinent labs & imaging results that were available during my care of the patient were reviewed by me and considered in my medical decision making.  Assessment & Plan:  Virginia Young was seen today for medical management of chronic issues.  Diagnoses and all orders for this visit:  Chronic systolic HF (heart failure) ([place]) Doing well on current regimen. Will continue. Keep follow up with cardiology.   Essential hypertension BP well controlled. Changes were not made in regimen today. Goal BP is 130/80. Pt aware to report any persistent high or low readings. DASH diet and exercise encouraged. Exercise at least 150 minutes per week and increase as tolerated. Goal BMI > 25. Stress management encouraged. Avoid nicotine and tobacco product use. Avoid excessive alcohol and NSAID's. Avoid  more than 2000 mg of sodium daily. Medications as prescribed. Follow up as scheduled.   Paroxysmal atrial fibrillation (HCC) Rate well controlled on metoprolol. Tolerating Eliquis well. Pt aware of symptoms that require emergent evaluation and treatment. Keep follow up with cardiology as scheduled.   Early onset Alzheimer's disease with behavioral disturbance ([place]) No changes in mental status or behavior. Doing well. No changes in regimen.   Postmenopausal Screening for osteoporosis DEXA scan today to screen for osteoporosis.  -     DG [facility] DEXA; Future     Continue all other maintenance medications.  Follow up plan: Return in about 6 months (around 02/08/2020), or if symptoms worsen or fail to improve.  Continue healthy lifestyle choices, including diet (rich in fruits, vegetables, and lean proteins, and low in salt and simple carbohydrates) and exercise (at least 30 minutes of moderate physical activity daily).  Educational handout given for DASH diet  The above assessment and management plan was discussed with the patient. The patient verbalized understanding of and has agreed to the management plan. Patient is aware to call the clinic if they develop any new symptoms or if symptoms persist or worsen. Patient is aware when to return to the clinic for a follow-up visit. Patient educated on when it is appropriate to go to the emergency department.   Monia Pouch, FNP-C [facility] [phone]

## 2019-08-11 NOTE — Patient Instructions (Addendum)
DASH Eating Plan DASH stands for "Dietary Approaches to Stop Hypertension." The DASH eating plan is a healthy eating plan that has been shown to reduce high blood pressure (hypertension). Additional health benefits may include reducing the risk of type 2 diabetes mellitus, heart disease, and stroke. The DASH eating plan may also help with weight loss.  WHAT DO I NEED TO KNOW ABOUT THE DASH EATING PLAN? For the DASH eating plan, you will follow these general guidelines:  Choose foods with a percent daily value for sodium of less than 5% (as listed on the food label).  Use salt-free seasonings or herbs instead of table salt or sea salt.  Check with your health care provider or pharmacist before using salt substitutes.  Eat lower-sodium products, often labeled as "lower sodium" or "no salt added."  Eat fresh foods.  Eat more vegetables, fruits, and low-fat dairy products.  Choose whole grains. Look for the word "whole" as the first word in the ingredient list.  Choose fish and skinless chicken or turkey more often than red meat. Limit fish, poultry, and meat to 6 oz (170 g) each day.  Limit sweets, desserts, sugars, and sugary drinks.  Choose heart-healthy fats.  Limit cheese to 1 oz (28 g) per day.  Eat more home-cooked food and less restaurant, buffet, and fast food.  Limit fried foods.  Cook foods using methods other than frying.  Limit canned vegetables. If you do use them, rinse them well to decrease the sodium.  When eating at a restaurant, ask that your food be prepared with less salt, or no salt if possible.  WHAT FOODS CAN I EAT? Seek help from a dietitian for individual calorie needs.  Grains Whole grain or whole wheat bread. Brown rice. Whole grain or whole wheat pasta. Quinoa, bulgur, and whole grain cereals. Low-sodium cereals. Corn or whole wheat flour tortillas. Whole grain cornbread. Whole grain crackers. Low-sodium crackers.  Vegetables Fresh or frozen  vegetables (raw, steamed, roasted, or grilled). Low-sodium or reduced-sodium tomato and vegetable juices. Low-sodium or reduced-sodium tomato sauce and paste. Low-sodium or reduced-sodium canned vegetables.   Fruits All fresh, canned (in natural juice), or frozen fruits.  Meat and Other Protein Products Ground beef (85% or leaner), grass-fed beef, or beef trimmed of fat. Skinless chicken or turkey. Ground chicken or turkey. Pork trimmed of fat. All fish and seafood. Eggs. Dried beans, peas, or lentils. Unsalted nuts and seeds. Unsalted canned beans.  Dairy Low-fat dairy products, such as skim or 1% milk, 2% or reduced-fat cheeses, low-fat ricotta or cottage cheese, or plain low-fat yogurt. Low-sodium or reduced-sodium cheeses.  Fats and Oils Tub margarines without trans fats. Light or reduced-fat mayonnaise and salad dressings (reduced sodium). Avocado. Safflower, olive, or canola oils. Natural peanut or almond butter.  Other Unsalted popcorn and pretzels. The items listed above may not be a complete list of recommended foods or beverages. Contact your dietitian for more options.  WHAT FOODS ARE NOT RECOMMENDED?  Grains White bread. White pasta. White rice. Refined cornbread. Bagels and croissants. Crackers that contain trans fat.  Vegetables Creamed or fried vegetables. Vegetables in a cheese sauce. Regular canned vegetables. Regular canned tomato sauce and paste. Regular tomato and vegetable juices.  Fruits Dried fruits. Canned fruit in light or heavy syrup. Fruit juice.  Meat and Other Protein Products Fatty cuts of meat. Ribs, chicken wings, bacon, sausage, bologna, salami, chitterlings, fatback, hot dogs, bratwurst, and packaged luncheon meats. Salted nuts and seeds. Canned beans with salt.    Dairy Whole or 2% milk, cream, half-and-half, and cream cheese. Whole-fat or sweetened yogurt. Full-fat cheeses or blue cheese. Nondairy creamers and whipped toppings. Processed cheese,  cheese spreads, or cheese curds.  Condiments Onion and garlic salt, seasoned salt, table salt, and sea salt. Canned and packaged gravies. Worcestershire sauce. Tartar sauce. Barbecue sauce. Teriyaki sauce. Soy sauce, including reduced sodium. Steak sauce. Fish sauce. Oyster sauce. Cocktail sauce. Horseradish. Ketchup and mustard. Meat flavorings and tenderizers. Bouillon cubes. Hot sauce. Tabasco sauce. Marinades. Taco seasonings. Relishes.  Fats and Oils Butter, stick margarine, lard, shortening, ghee, and bacon fat. Coconut, palm kernel, or palm oils. Regular salad dressings.  Other Pickles and olives. Salted popcorn and pretzels.  The items listed above may not be a complete list of foods and beverages to avoid. Contact your dietitian for more information.  WHERE CAN I FIND MORE INFORMATION? National Heart, Lung, and [employer] Institute: www.nhlbi.nih.gov/health/health-topics/topics/dash/ Document Released: 05/30/2011 Document Revised: 10/25/2013 Document Reviewed: 04/14/2013 ExitCare Patient Information 2015 [employer]. This information is not intended to replace advice given to you by your health care provider. Make sure you discuss any questions you have with your health care provider.   I think that you would greatly benefit from seeing a nutritionist.  If you are interested, please call Dr Sykes at [phone] to schedule an appointment.   

## 2019-08-12 ENCOUNTER — Other Ambulatory Visit: Payer: Self-pay | Admitting: Family Medicine

## 2019-08-12 DIAGNOSIS — I480 Paroxysmal atrial fibrillation: Secondary | ICD-10-CM

## 2019-08-12 MED ORDER — CALCIUM CARBONATE-VITAMIN D 500-200 MG-UNIT PO TABS: 1.0000 | ORAL_TABLET | Freq: Two times a day (BID) | ORAL | 3 refills | Status: DC

## 2019-09-05 NOTE — Progress Notes (Signed)
Cardiology Office Note   Date:  09/06/2019   ID:  Virginia Young, DOB 1940-07-15, MRN [id number]  PCP:  Baruch Gouty, FNP  Cardiologist:   Minus Breeding, MD  Chief Complaint  Patient presents with  . Shortness of Breath      History of Present Illness: Virginia Young is a 79 y.o. female who presents for follow up of atrial fibrillation and nonischemic cardiomyopathy.  She has a mildly reduced ejection fraction of 40% in 04/2016.  She has had orthostatic hypotension.  She had PFTs.    This demonstrated a diffusion defect.     She has continued to have increased dyspnea.  At a previous visit I ordered home O2.  However, for some reason she did not qualify for this. I sent her for kyphoscoliosis and it was thought that this in her heart failure likely more the cause of her shortness of breath.  We increased her Lasix.  However, she did not have increased dyspnea and she did not tolerate increased meds.  However at the last visit I tried to titrate beta blocker and tried again for home O2.     Thankfully she was able to get this.  She feels much better using the oxygen.  She uses it as needed which is most of the time although she does not feel like she needs to sleep with it.  If she gets short of breath she puts it on.  She is not struggling out of breathing.  She denies any PND or orthopnea.  She has had no new palpitations, presyncope or syncope.  She is not had any chest discomfort, neck or arm discomfort.  She did tolerate the very subtle med titration as above when I added beta-blocker.   Past Medical History:  Diagnosis Date  . Allergy   . Atrial fibrillation (HCC)    coumadin  . Cataract   . Chest pain 03/2012   a. Lex MV 10/13:  EF 64%, dist ant and apical defect sugg of soft tissue atten, no ischemia  . Chronic systolic heart failure ([place])   . Depression   . GERD (gastroesophageal reflux disease)   . HLD (hyperlipidemia)   . Mild mitral regurgitation   . MS  (multiple sclerosis) ([place])   . NICM (nonischemic cardiomyopathy) ([place])    a. neg CLite in 2003;  b. EF 40-45% in past;   c.  Echo 12/11: EF 35-40%, mild MR, mild LAE, mild RAE, small pericardial effusion   . Osteoarthritis   . Osteoporosis   . Stasis ulcer ([place])   . Vaginal prolapse without uterine prolapse   . Varicose veins     Past Surgical History:  Procedure Laterality Date  . ANTERIOR AND POSTERIOR VAGINAL REPAIR W/ SACROSPINOUS LIGAMENT SUSPENSION  2016   [facility]  . BLADDER SURGERY    . CARPAL TUNNEL RELEASE Right 08/22/2017   Procedure: CARPAL TUNNEL RELEASE;  Surgeon: [name], MD;  Location: [facility];  Service: Orthopedics;  Laterality: Right;  . cataract surgery Bilateral   . EYE SURGERY    . INGUINAL HERNIA REPAIR     right  . LIPOMA EXCISION     h/o removed from upper back x 2  . REVERSE SHOULDER ARTHROPLASTY Right 08/22/2017   Procedure: REVERSE SHOULDER ARTHROPLASTY;  Surgeon: [name], MD;  Location: [facility];  Service: Orthopedics;  Laterality: Right;     Current Outpatient Medications  Medication Sig Dispense Refill  . calcium-vitamin D (OSCAL  WITH D) 500-200 MG-UNIT tablet Take 1 tablet by mouth 2 (two) times daily. 180 tablet 3  . [employer]S D3 25 MCG (1000 UT) capsule Take 1 capsule (1,000 Units total) by mouth daily. 90 capsule 1  . divalproex (DEPAKOTE ER) 250 MG 24 hr tablet TAKE 1 TABLET (250 MG TOTAL) BY MOUTH DAILY. MUST KEEP PENDING APPOINTMENT FOR FURTHER REFILLS. 30 tablet 2  . ELIQUIS 5 MG TABS tablet TAKE 1 TABLET BY MOUTH EVERY 12 HOURS 60 tablet 1  . furosemide (LASIX) 20 MG tablet Take 60 mg by mouth.    . linaclotide (LINZESS) 145 MCG CAPS capsule TAKE 1 CAPSULE (145 MCG TOTAL) BY MOUTH DAILY BEFORE BREAKFAST. 90 capsule 0  . losartan (COZAAR) 25 MG tablet Take 12.5 mg by mouth daily.    . Multiple Vitamin (MULTIVITAMIN) tablet Take 1 tablet by mouth daily.    . OXYGEN Inhale into the lungs. 2 liters PRN - anytime when needed ( cardio rx'd)    .  QUEtiapine (SEROQUEL) 50 MG tablet TAKE 1 TABLET BY MOUTH TWICE A DAY 180 tablet 3  . metoprolol tartrate (LOPRESSOR) 25 MG tablet Take 1 tablet (25 mg total) by mouth 2 (two) times daily. 180 tablet 3   No current facility-administered medications for this visit.    Allergies:   Bactrim [sulfamethoxazole-trimethoprim], Lipitor [atorvastatin calcium], and Lisinopril    ROS:  Please see the history of present illness.   Otherwise, review of systems are positive for none.   All other systems are reviewed and negative.    PHYSICAL EXAM: VS:  BP 124/78   Pulse 79   Ht 5\' 7"  (1.702 m)   Wt 219 lb 9.6 oz (99.6 kg)   SpO2 95%   BMI 34.39 kg/m  , BMI Body mass index is 34.39 kg/m. GENERAL: Somewhat frail appearing NECK:  No jugular venous distention, waveform within normal limits, carotid upstroke brisk and symmetric, no bruits, no thyromegaly LUNGS:  Clear to auscultation bilaterally CHEST:  Unremarkable HEART:  PMI not displaced or sustained,S1 and S2 within normal limits, no S3, no S4, no clicks, no rubs, no murmurs ABD:  Flat, positive bowel sounds normal in frequency in pitch, no bruits, no rebound, no guarding, no midline pulsatile mass, no hepatomegaly, no splenomegaly EXT:  2 plus pulses throughout, no edema, no cyanosis no clubbing   EKG:  EKG is not ordered today.  Recent Labs: 05/24/2019: ALT 20 07/19/2019: BUN 13; Creatinine, Ser 0.92; Hemoglobin 14.6; Platelets 258; Potassium 4.1; Sodium 138    Lipid Panel    Component Value Date/Time   CHOL 196 05/15/2015 0827   TRIG 74 05/15/2015 0827   HDL 73 05/15/2015 0827   CHOLHDL 2.7 05/15/2015 0827   CHOLHDL 3 03/22/2013 1126   VLDL 17.8 03/22/2013 1126   LDLCALC 108 (H) 05/15/2015 0827      Wt Readings from Last 3 Encounters:  09/06/19 219 lb 9.6 oz (99.6 kg)  08/11/19 209 lb (94.8 kg)  07/19/19 212 lb 6.4 oz (96.3 kg)     SATURATION QUALIFICATIONS: (This note is used to comply with regulatory documentation for  home oxygen)  Patient Saturations on Room Air at Rest = 95%  Patient Saturations on Room Air while Ambulating = 85%  Patient Saturations on 2 Liters of oxygen while Ambulating = 95%  Please briefly explain why patient needs home oxygen:  Hypoxemia not explained by her cardiomyopathy.   Other studies Reviewed: Additional studies/ records that were reviewed today include: None Review  of the above records demonstrates:  Please see elsewhere in the note.     ASSESSMENT AND PLAN:  NICM:    Today I will try to titrate her medications again to 25 mg twice daily metoprolol.  She has not tolerated a lot of med titrations.  She is much improved with as needed oxygen.  Of note we think that her dyspnea is multifactorial and I reviewed her recent pulmonary note.  She has some kyphoscoliosis, heart failure, mild lung disease.  Is not felt that further invasive evaluation would identify one treatable cause so we will manage this conservatively as above.  Atrial fib:   Virginia Young has a CHA2DS2 - VASc score of 3.  She tolerates anticoagulation with no bleeding.  No change in therapy.   Covid education:    She is getting the second dose of her vaccine today.    Current medicines are reviewed at length with the patient today.  The patient does not have concerns regarding medicines.  The following changes have been made:  As above  Labs/ tests ordered today include:   No orders of the defined types were placed in this encounter.    Disposition:   FU with APP in one month    Signed, Minus Breeding, MD  09/06/2019 12:30 PM    [place]

## 2019-09-06 ENCOUNTER — Encounter: Payer: Self-pay | Admitting: Cardiology

## 2019-09-06 ENCOUNTER — Other Ambulatory Visit: Payer: Self-pay

## 2019-09-06 ENCOUNTER — Ambulatory Visit (INDEPENDENT_AMBULATORY_CARE_PROVIDER_SITE_OTHER): Payer: Medicare Other | Admitting: Cardiology

## 2019-09-06 VITALS — BP 124/78 | HR 79 | Ht 67.0 in | Wt 219.6 lb

## 2019-09-06 DIAGNOSIS — I482 Chronic atrial fibrillation, unspecified: Secondary | ICD-10-CM

## 2019-09-06 DIAGNOSIS — Z7189 Other specified counseling: Secondary | ICD-10-CM | POA: Diagnosis not present

## 2019-09-06 DIAGNOSIS — Z23 Encounter for immunization: Secondary | ICD-10-CM | POA: Diagnosis not present

## 2019-09-06 DIAGNOSIS — I428 Other cardiomyopathies: Secondary | ICD-10-CM | POA: Diagnosis not present

## 2019-09-06 MED ORDER — METOPROLOL TARTRATE 25 MG PO TABS: 25.0000 mg | ORAL_TABLET | Freq: Two times a day (BID) | ORAL | 3 refills | Status: DC

## 2019-09-06 NOTE — Patient Instructions (Signed)
Medication Instructions:  Once you complete your prescription of Metoprolol Succinate 25mg  daily, start Metoprolol Tartrate 25mg  twice a day *If you need a refill on your cardiac medications before your next appointment, please call your pharmacy*  Lab Work: None needed this visit  Testing/Procedures: None needed this visit  Follow-Up: At [facility], you and your health needs are our priority.  As part of our continuing mission to provide you with exceptional heart care, we have created designated Provider Care Teams.  These Care Teams include your primary Cardiologist (physician) and Advanced Practice Providers (APPs -  Physician Assistants and Nurse Practitioners) who all work together to provide you with the care you need, when you need it.  We recommend signing up for the patient portal called "MyChart".  Sign up information is provided on this After Visit Summary.  MyChart is used to connect with patients for Virtual Visits (Telemedicine).  Patients are able to view lab/test results, encounter notes, upcoming appointments, etc.  Non-urgent messages can be sent to your provider as well.   To learn more about what you can do with MyChart, go to [email or web address].    Your next appointment:   6 month(s)  You will receive a reminder letter in the mail two months in advance. If you don't receive a letter, please call our office to schedule the follow-up appointment.  The format for your next appointment:   In Person  Provider:   Minus Breeding, MD

## 2019-09-10 ENCOUNTER — Telehealth: Payer: Self-pay | Admitting: Family Medicine

## 2019-09-10 NOTE — Chronic Care Management (AMB) (Signed)
  Chronic Care Management   Outreach Note  09/10/2019 Name: [name] MRN: [id number] DOB: March 29, 1941  Virginia Young is a 79 y.o. year old female who is a primary care patient of Rakes, Connye Burkitt, FNP. I reached out to Virginia Young by phone today in response to a referral sent by Ms. Jules Schick Sze's health plan.     An unsuccessful telephone outreach was attempted today. The patient was referred to the case management team for assistance with care management and care coordination.   Follow Up Plan: A HIPPA compliant phone message was left for the patient providing contact information and requesting a return call.  The care management team will reach out to the patient again over the next 7 days.  If patient returns call to provider office, please advise to call [place] at [phone].  [place],  [id number] Direct Dial: [phone] [email or web address]: [email or web address]

## 2019-09-10 NOTE — Chronic Care Management (AMB) (Signed)
  Chronic Care Management   Note  09/10/2019 Name: [name] MRN: [id number] DOB: December 10, 1940  Virginia Young is a 79 y.o. year old female who is a primary care patient of Rakes, Connye Burkitt, FNP. I reached out to Virginia Young by phone today in response to a referral sent by Ms. Jules Schick Aww[employer]'s health plan.     [name] was given information about Chronic Care Management services today including:  1. CCM service includes personalized support from designated clinical staff supervised by her physician, including individualized plan of care and coordination with other care providers 2. 24/7 contact phone numbers for assistance for urgent and routine care needs. 3. Service will only be billed when office clinical staff spend 20 minutes or more in a month to coordinate care. 4. Only one practitioner may furnish and bill the service in a calendar month. 5. The patient may stop CCM services at any time (effective at the end of the month) by phone call to the office staff. 6. The patient will be responsible for cost sharing (co-pay) of up to 20% of the service fee (after annual deductible is met).  Patient agreed to services and verbal consent obtained.   Follow up plan: Telephone appointment with care management team member scheduled for:02/08/2020.  [place], [place] [address] Direct Dial: [phone] [email or web address] Website: [email or web address]

## 2019-09-21 ENCOUNTER — Other Ambulatory Visit: Payer: Self-pay | Admitting: Cardiology

## 2019-09-25 ENCOUNTER — Other Ambulatory Visit: Payer: Self-pay | Admitting: Family Medicine

## 2019-09-25 DIAGNOSIS — E559 Vitamin D deficiency, unspecified: Secondary | ICD-10-CM

## 2019-10-04 ENCOUNTER — Encounter: Payer: Self-pay | Admitting: *Deleted

## 2019-10-19 DIAGNOSIS — M170 Bilateral primary osteoarthritis of knee: Secondary | ICD-10-CM | POA: Diagnosis not present

## 2019-10-19 DIAGNOSIS — M25562 Pain in left knee: Secondary | ICD-10-CM | POA: Diagnosis not present

## 2019-10-19 DIAGNOSIS — M25561 Pain in right knee: Secondary | ICD-10-CM | POA: Diagnosis not present

## 2019-10-28 ENCOUNTER — Other Ambulatory Visit: Payer: Self-pay | Admitting: Neurology

## 2019-10-28 ENCOUNTER — Other Ambulatory Visit: Payer: Self-pay | Admitting: *Deleted

## 2019-10-28 DIAGNOSIS — I480 Paroxysmal atrial fibrillation: Secondary | ICD-10-CM

## 2019-10-28 MED ORDER — ELIQUIS 5 MG PO TABS: 5.0000 mg | ORAL_TABLET | Freq: Two times a day (BID) | ORAL | 1 refills | Status: DC

## 2019-11-10 ENCOUNTER — Other Ambulatory Visit: Payer: Self-pay | Admitting: Cardiology

## 2019-11-24 ENCOUNTER — Encounter: Payer: Self-pay | Admitting: Family Medicine

## 2019-11-24 ENCOUNTER — Other Ambulatory Visit: Payer: Self-pay

## 2019-11-24 ENCOUNTER — Ambulatory Visit (INDEPENDENT_AMBULATORY_CARE_PROVIDER_SITE_OTHER): Payer: Medicare Other | Admitting: Family Medicine

## 2019-11-24 VITALS — BP 127/79 | HR 94 | Ht 70.0 in | Wt 213.0 lb

## 2019-11-24 DIAGNOSIS — F0391 Unspecified dementia with behavioral disturbance: Secondary | ICD-10-CM

## 2019-11-24 DIAGNOSIS — I428 Other cardiomyopathies: Secondary | ICD-10-CM

## 2019-11-24 DIAGNOSIS — R569 Unspecified convulsions: Secondary | ICD-10-CM

## 2019-11-24 MED ORDER — MEMANTINE HCL 5 MG PO TABS: 5.0000 mg | ORAL_TABLET | Freq: Two times a day (BID) | ORAL | 3 refills | Status: DC

## 2019-11-24 NOTE — Patient Instructions (Signed)
We will start Namenda (memantine) 5mg  twice daily. Start 5mg  once daily for 1-2 weeks then increase dose to 5mg  twice daily.   Continue divalproex and quetiapine as prescribed.   Follow up with PCP for any concerns of urinary tract infections.   Follow up in 6 months  Memantine Tablets What is this medicine? MEMANTINE (MEM an teen) is used to treat dementia caused by Alzheimer's disease. This medicine may be used for other purposes; ask your health care provider or pharmacist if you have questions. COMMON BRAND NAME(S): Namenda What should I tell my health care provider before I take this medicine? They need to know if you have any of these conditions:  difficulty passing urine  kidney disease  liver disease  seizures  an unusual or allergic reaction to memantine, other medicines, foods, dyes, or preservatives  pregnant or trying to get pregnant  breast-feeding How should I use this medicine? Take this medicine by mouth with a glass of water. Follow the directions on the prescription label. You may take this medicine with or without food. Take your doses at regular intervals. Do not take your medicine more often than directed. Continue to take your medicine even if you feel better. Do not stop taking except on the advice of your doctor or health care professional. Talk to your pediatrician regarding the use of this medicine in children. Special care may be needed. Overdosage: If you think you have taken too much of this medicine contact a poison control center or emergency room at once. NOTE: This medicine is only for you. Do not share this medicine with others. What if I miss a dose? If you miss a dose, take it as soon as you can. If it is almost time for your next dose, take only that dose. Do not take double or extra doses. If you do not take your medicine for several days, contact your health care provider. Your dose may need to be changed. What may interact with this  medicine?  acetazolamide  amantadine  cimetidine  dextromethorphan  dofetilide  hydrochlorothiazide  ketamine  metformin  methazolamide  quinidine  ranitidine  sodium bicarbonate  triamterene This list may not describe all possible interactions. Give your health care provider a list of all the medicines, herbs, non-prescription drugs, or dietary supplements you use. Also tell them if you smoke, drink alcohol, or use illegal drugs. Some items may interact with your medicine. What should I watch for while using this medicine? Visit your doctor or health care professional for regular checks on your progress. Check with your doctor or health care professional if there is no improvement in your symptoms or if they get worse. You may get drowsy or dizzy. Do not drive, use machinery, or do anything that needs mental alertness until you know how this drug affects you. Do not stand or sit up quickly, especially if you are an older patient. This reduces the risk of dizzy or fainting spells. Alcohol can make you more drowsy and dizzy. Avoid alcoholic drinks. What side effects may I notice from receiving this medicine? Side effects that you should report to your doctor or health care professional as soon as possible:  allergic reactions like skin rash, itching or hives, swelling of the face, lips, or tongue  agitation or a feeling of restlessness  depressed mood  dizziness  hallucinations  redness, blistering, peeling or loosening of the skin, including inside the mouth  seizures  vomiting Side effects that usually do not  require medical attention (report to your doctor or health care professional if they continue or are bothersome):  constipation  diarrhea  headache  nausea  trouble sleeping This list may not describe all possible side effects. Call your doctor for medical advice about side effects. You may report side effects to FDA at 1-800-FDA-1088. Where should I  keep my medicine? Keep out of the reach of children. Store at room temperature between 15 degrees and 30 degrees C (59 degrees and 86 degrees F). Throw away any unused medicine after the expiration date. NOTE: This sheet is a summary. It may not cover all possible information. If you have questions about this medicine, talk to your doctor, pharmacist, or health care provider.  2020 Elsevier/Gold Standard (2013-03-29 14:10:42)   Dementia Caregiver Guide Dementia is a term used to describe a number of symptoms that affect memory and thinking. The most common symptoms include:  Memory loss.  Trouble with language and communication.  Trouble concentrating.  Poor judgment.  Problems with reasoning.  Child-like behavior and language.  Extreme anxiety.  Angry outbursts.  Wandering from home or public places. Dementia usually gets worse slowly over time. In the early stages, people with dementia can stay independent and safe with some help. In later stages, they need help with daily tasks such as dressing, grooming, and using the bathroom. How to help the person with dementia cope Dementia can be frightening and confusing. Here are some tips to help the person with dementia cope with changes caused by the disease. General tips  Keep the person on track with his or her routine.  Try to identify areas where the person may need help.  Be supportive, patient, calm, and encouraging.  Gently remind the person that adjusting to changes takes time.  Help with the tasks that the person has asked for help with.  Keep the person involved in daily tasks and decisions as much as possible.  Encourage conversation, but try not to get frustrated or harried if the person struggles to find words or does not seem to appreciate your help. Communication tips  When the person is talking or seems frustrated, make eye contact and hold the person's hand.  Ask specific questions that need yes or no  answers.  Use simple words, short sentences, and a calm voice. Only give one direction at a time.  When offering choices, limit them to just 1 or 2.  Avoid correcting the person in a negative way.  If the person is struggling to find the right words, gently try to help him or her. How to recognize symptoms of stress Symptoms of stress in caregivers include:  Feeling frustrated or angry with the person with dementia.  Denying that the person has dementia or that his or her symptoms will not improve.  Feeling hopeless and unappreciated.  Difficulty sleeping.  Difficulty concentrating.  Feeling anxious, irritable, or depressed.  Developing stress-related health problems.  Feeling like you have too little time for your own life. Follow these instructions at home:   Make sure that you and the person you are caring for: ? Get regular sleep. ? Exercise regularly. ? Eat regular, nutritious meals. ? Drink enough fluid to keep your urine clear or pale yellow. ? Take over-the-counter and prescription medicines only as told by your health care providers. ? Attend all scheduled health care appointments.  Join a support group with others who are caregivers.  Ask about respite care resources so that you can have a  regular break from the stress of caregiving.  Look for signs of stress in yourself and in the person you are caring for. If you notice signs of stress, take steps to manage it.  Consider any safety risks and take steps to avoid them.  Organize medications in a pill box for each day of the week.  Create a plan to handle any legal or financial matters. Get legal or financial advice if needed.  Keep a calendar in a central location to remind the person of appointments or other activities. Tips for reducing the risk of injury  Keep floors clear of clutter. Remove rugs, magazine racks, and floor lamps.  Keep hallways well lit, especially at night.  Put a handrail and  nonslip mat in the bathtub or shower.  Put childproof locks on cabinets that contain dangerous items, such as medicines, alcohol, guns, toxic cleaning items, sharp tools or utensils, matches, and lighters.  Put the locks in places where the person cannot see or reach them easily. This will help ensure that the person does not wander out of the house and get lost.  Be prepared for emergencies. Keep a list of emergency phone numbers and addresses in a convenient area.  Remove car keys and lock garage doors so that the person does not try to get in the car and drive.  Have the person wear a bracelet that tracks locations and identifies the person as having memory problems. This should be worn at all times for safety. Where to find support: Many individuals and organizations offer support. These include:  Support groups for people with dementia and for caregivers.  Counselors or therapists.  Home health care services.  Adult day care centers. Where to find more information Alzheimer's Association: [place].co.nz Contact a health care provider if:  The person's health is rapidly getting worse.  You are no longer able to care for the person.  Caring for the person is affecting your physical and emotional health.  The person threatens himself or herself, you, or anyone else. Summary  Dementia is a term used to describe a number of symptoms that affect memory and thinking.  Dementia usually gets worse slowly over time.  Take steps to reduce the person's risk of injury, and to plan for future care.  Caregivers need support, relief from caregiving, and time for their own lives. This information is not intended to replace advice given to you by your health care provider. Make sure you discuss any questions you have with your health care provider. Document Revised: 05/23/2017 Document Reviewed: 05/14/2016 Elsevier Patient Education  [place].   Memory Compensation  Strategies  1. Use "WARM" strategy.  W= write it down  A= associate it  R= repeat it  M= make a mental note  2.   You can keep a Social worker.  Use a 3-ring notebook with sections for the following: calendar, important names and phone numbers,  medications, doctors' names/phone numbers, lists/reminders, and a section to journal what you did  each day.   3.    Use a calendar to write appointments down.  4.    Write yourself a schedule for the day.  This can be placed on the calendar or in a separate section of the Memory Notebook.  Keeping a  regular schedule can help memory.  5.    Use medication organizer with sections for each day or morning/evening pills.  You may need help loading it  6.    Keep  a basket, or pegboard by the door.  Place items that you need to take out with you in the basket or on the pegboard.  You may also want to  include a message board for reminders.  7.    Use sticky notes.  Place sticky notes with reminders in a place where the task is performed.  For example: " turn off the  stove" placed by the stove, "lock the door" placed on the door at eye level, " take your medications" on  the bathroom mirror or by the place where you normally take your medications.  8.    Use alarms/timers.  Use while cooking to remind yourself to check on food or as a reminder to take your medicine, or as a  reminder to make a call, or as a reminder to perform another task, etc.
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
## 2019-11-29 ENCOUNTER — Other Ambulatory Visit: Payer: Self-pay | Admitting: *Deleted

## 2019-11-29 DIAGNOSIS — K5900 Constipation, unspecified: Secondary | ICD-10-CM

## 2019-11-29 MED ORDER — LINACLOTIDE 145 MCG PO CAPS: ORAL_CAPSULE | ORAL | 0 refills | Status: DC

## 2019-12-17 ENCOUNTER — Ambulatory Visit (INDEPENDENT_AMBULATORY_CARE_PROVIDER_SITE_OTHER): Payer: Medicare Other | Admitting: Nurse Practitioner

## 2019-12-17 ENCOUNTER — Encounter: Payer: Self-pay | Admitting: Nurse Practitioner

## 2019-12-17 DIAGNOSIS — I959 Hypotension, unspecified: Secondary | ICD-10-CM

## 2019-12-17 NOTE — Progress Notes (Signed)
   Virtual Visit via telephone Note Due to COVID-19 pandemic this visit was conducted virtually. This visit type was conducted due to national recommendations for restrictions regarding the COVID-19 Pandemic (e.g. social distancing, sheltering in place) in an effort to limit this patient's exposure and mitigate transmission in our community. All issues noted in this document were discussed and addressed.  A physical exam was not performed with this format.  I connected with Virginia Young on 12/17/19 at 12:20 by telephone and verified that I am speaking with the correct person using two identifiers. Virginia Young is currently located at home and  Her caregiver  is currently with her during visit. The provider, Mary-Margaret Hassell Done, FNP is located in their office at time of visit.  I discussed the limitations, risks, security and privacy concerns of performing an evaluation and management service by telephone and the availability of in person appointments. I also discussed with the patient that there may be a patient responsible charge related to this service. The patient expressed understanding and agreed to proceed.   History and Present Illness:   Chief Complaint: Nausea   HPI Patient care giver made appointment. She said when she got to the house this morning patient said she did not feel well. Her blood pressure was 100/76. She is much better iw and her blood pressure now is 136/86.   Review of Systems  Constitutional: Negative.   Respiratory: Negative.   Cardiovascular: Negative.   Genitourinary: Negative.   Neurological: Negative.   Psychiatric/Behavioral: Negative.   All other systems reviewed and are negative.    Observations/Objective: Alert and oriented- answers all questions appropriately No distress    Assessment and Plan: Virginia Young in today with chief complaint of Nausea   1. Hypotension, unspecified hypotension type Just continue to watch her  and report any changes keep check of vitla signs      Follow Up Instructions: prn    I discussed the assessment and treatment plan with the patient. The patient was provided an opportunity to ask questions and all were answered. The patient agreed with the plan and demonstrated an understanding of the instructions.   The patient was advised to call back or seek an in-person evaluation if the symptoms worsen or if the condition fails to improve as anticipated.  The above assessment and management plan was discussed with the patient. The patient verbalized understanding of and has agreed to the management plan. Patient is aware to call the clinic if symptoms persist or worsen. Patient is aware when to return to the clinic for a follow-up visit. Patient educated on when it is appropriate to go to the emergency department.   Time call ended:  12:30  I provided 15 minutes of non-face-to-face time during this encounter.    Mary-Margaret Hassell Done, FNP

## 2019-12-20 ENCOUNTER — Other Ambulatory Visit: Payer: Self-pay | Admitting: Nurse Practitioner

## 2019-12-20 DIAGNOSIS — I480 Paroxysmal atrial fibrillation: Secondary | ICD-10-CM

## 2019-12-22 ENCOUNTER — Other Ambulatory Visit: Payer: Self-pay | Admitting: *Deleted

## 2019-12-22 ENCOUNTER — Encounter: Payer: Self-pay | Admitting: Family Medicine

## 2019-12-22 ENCOUNTER — Ambulatory Visit (INDEPENDENT_AMBULATORY_CARE_PROVIDER_SITE_OTHER): Payer: Medicare Other | Admitting: Family Medicine

## 2019-12-22 ENCOUNTER — Other Ambulatory Visit: Payer: Self-pay

## 2019-12-22 VITALS — BP 147/70 | HR 89 | Temp 97.0°F | Ht 70.0 in | Wt 200.0 lb

## 2019-12-22 DIAGNOSIS — R82998 Other abnormal findings in urine: Secondary | ICD-10-CM

## 2019-12-22 DIAGNOSIS — M545 Low back pain, unspecified: Secondary | ICD-10-CM

## 2019-12-22 DIAGNOSIS — R102 Pelvic and perineal pain: Secondary | ICD-10-CM

## 2019-12-22 DIAGNOSIS — R8290 Unspecified abnormal findings in urine: Secondary | ICD-10-CM

## 2019-12-22 LAB — URINALYSIS, COMPLETE
Bilirubin, UA: NEGATIVE
Glucose, UA: NEGATIVE
Ketones, UA: NEGATIVE
Nitrite, UA: POSITIVE — AB
Specific Gravity, UA: 1.015 (ref 1.005–1.030)
Urobilinogen, Ur: 0.2 mg/dL (ref 0.2–1.0)
pH, UA: 7 (ref 5.0–7.5)

## 2019-12-22 LAB — MICROSCOPIC EXAMINATION: WBC, UA: >30 /hpf — AB (ref 0–5)

## 2019-12-22 MED ORDER — CEPHALEXIN 500 MG PO CAPS: 500.0000 mg | ORAL_CAPSULE | Freq: Four times a day (QID) | ORAL | 0 refills | Status: DC

## 2019-12-22 MED ORDER — DIVALPROEX SODIUM ER 250 MG PO TB24: 250.0000 mg | ORAL_TABLET | Freq: Every day | ORAL | 3 refills | Status: DC

## 2019-12-22 NOTE — Progress Notes (Signed)
BP (!) 147/70   Pulse 89   Temp (!) 97 F (36.1 C)   Ht 5\' 10"  (1.778 m)   Wt 200 lb (90.7 kg)   SpO2 98%   BMI 28.70 kg/m    Subjective:   Patient ID: Virginia Young, female    DOB: 1941-06-22, 79 y.o.   MRN: [id number]  HPI: Virginia Young is a 79 y.o. female presenting on 12/22/2019 for Urinary Tract Infection   HPI Patient is coming in today with complaints of left lower back pain coming around to her stomach on that side.  She says is been going on for about a week.  She is also has a strong odor and dark urine and urinary frequency and pain in her pelvic region especially when she urinates.  She says all this is been going on for a week but denies any fevers but does have chills sometimes.  She does get frequent UTIs and has been an issue before.  Relevant past medical, surgical, family and social history reviewed and updated as indicated. Interim medical history since our last visit reviewed. Allergies and medications reviewed and updated.  Review of Systems  Constitutional: Negative for chills and fever.  Eyes: Negative for visual disturbance.  Respiratory: Negative for chest tightness and shortness of breath.   Cardiovascular: Negative for chest pain and leg swelling.  Gastrointestinal: Positive for abdominal distention and abdominal pain.  Genitourinary: Positive for flank pain, frequency and urgency. Negative for difficulty urinating, dysuria, hematuria, vaginal bleeding, vaginal discharge and vaginal pain.  Musculoskeletal: Negative for back pain and gait problem.  Skin: Negative for rash.  Neurological: Negative for light-headedness and headaches.  Psychiatric/Behavioral: Negative for agitation and behavioral problems.  All other systems reviewed and are negative.   Per HPI unless specifically indicated above   Allergies as of 12/22/2019      Reactions   Bactrim [sulfamethoxazole-trimethoprim]    Mouth sores   Lipitor [atorvastatin Calcium]     myalgia   Lisinopril    cough      Medication List       Accurate as of December 22, 2019  3:58 PM. If you have any questions, ask your nurse or doctor.        calcium-vitamin D 500-200 MG-UNIT tablet Commonly known as: OSCAL WITH D Take 1 tablet by mouth 2 (two) times daily.   CVS D3 25 MCG (1000 UT) capsule Generic drug: Cholecalciferol TAKE 1 CAPSULE (1,000 UNITS TOTAL) BY MOUTH DAILY.   divalproex 250 MG 24 hr tablet Commonly known as: DEPAKOTE ER Take 1 tablet (250 mg total) by mouth daily.   Eliquis 5 MG Tabs tablet Generic drug: apixaban TAKE 1 TABLET BY MOUTH EVERY 12 HOURS   furosemide 20 MG tablet Commonly known as: LASIX Take 3 tablets (60 mg total) by mouth daily.   linaclotide 145 MCG Caps capsule Commonly known as: Linzess TAKE 1 CAPSULE (145 MCG TOTAL) BY MOUTH DAILY BEFORE BREAKFAST.   losartan 25 MG tablet Commonly known as: COZAAR TAKE 1/2 TABLET BY MOUTH EVERY DAY   memantine 5 MG tablet Commonly known as: NAMENDA Take 1 tablet (5 mg total) by mouth 2 (two) times daily.   metoprolol tartrate 25 MG tablet Commonly known as: LOPRESSOR Take 1 tablet (25 mg total) by mouth 2 (two) times daily.   multivitamin tablet Take 1 tablet by mouth daily.   OXYGEN Inhale into the lungs. 2 liters PRN - anytime when needed ( cardio rx'd)  QUEtiapine 50 MG tablet Commonly known as: SEROQUEL TAKE 1 TABLET BY MOUTH TWICE A DAY        Objective:   BP (!) 147/70   Pulse 89   Temp (!) 97 F (36.1 C)   Ht 5\' 10"  (1.778 m)   Wt 200 lb (90.7 kg)   SpO2 98%   BMI 28.70 kg/m   Wt Readings from Last 3 Encounters:  12/22/19 200 lb (90.7 kg)  11/24/19 213 lb (96.6 kg)  09/06/19 219 lb 9.6 oz (99.6 kg)    Physical Exam Vitals and nursing note reviewed.  Constitutional:      General: She is not in acute distress.    Appearance: She is well-developed. She is not diaphoretic.  Eyes:     Conjunctiva/sclera: Conjunctivae normal.  Cardiovascular:      Rate and Rhythm: Normal rate and regular rhythm.     Heart sounds: Normal heart sounds. No murmur heard.   Pulmonary:     Effort: Pulmonary effort is normal. No respiratory distress.     Breath sounds: Normal breath sounds. No wheezing.  Abdominal:     General: Bowel sounds are normal. There is no distension.     Palpations: Abdomen is soft. Abdomen is not rigid. There is no mass.     Tenderness: There is abdominal tenderness (Left lower quadrant and suprapubic tenderness) in the suprapubic area. There is no right CVA tenderness, left CVA tenderness, guarding or rebound.  Skin:    General: Skin is warm and dry.     Findings: No rash.  Neurological:     Mental Status: She is alert and oriented to person, place, and time.     Coordination: Coordination normal.  Psychiatric:        Behavior: Behavior normal.       Assessment & Plan:   Problem List Items Addressed This Visit      Other   Low back pain - Primary   Relevant Medications   cephALEXin (KEFLEX) 500 MG capsule   Other Relevant Orders   Urine Culture   Urinalysis, Complete    Other Visit Diagnoses    Vaginal pain       Relevant Medications   cephALEXin (KEFLEX) 500 MG capsule   Other Relevant Orders   Urine Culture   Urinalysis, Complete   Dark urine       Relevant Medications   cephALEXin (KEFLEX) 500 MG capsule   Other Relevant Orders   Urine Culture   Urinalysis, Complete   Abnormal urine odor       Relevant Medications   cephALEXin (KEFLEX) 500 MG capsule   Other Relevant Orders   Urine Culture   Urinalysis, Complete      Will give Keflex to treat urinalysis,   urinalysis shows greater than 30 WBCs, 3-10 RBCs, nitrite positive, 3+ leukocytes and many bacteria Follow up plan: Return if symptoms worsen or fail to improve.  Counseling provided for all of the vaccine components Orders Placed This Encounter  Procedures  . Urine Culture  . Urinalysis, Complete    Caryl Pina, MD [facility] 12/22/2019, 3:58 PM

## 2019-12-25 LAB — URINE CULTURE

## 2019-12-31 ENCOUNTER — Telehealth: Payer: Self-pay | Admitting: Nurse Practitioner

## 2019-12-31 ENCOUNTER — Other Ambulatory Visit: Payer: Self-pay | Admitting: *Deleted

## 2019-12-31 DIAGNOSIS — R82998 Other abnormal findings in urine: Secondary | ICD-10-CM

## 2019-12-31 NOTE — Telephone Encounter (Signed)
Pts caregiver called stating that pt was seen last wk and was prescribed an antibiotic for UTI but says the medicine has not helped and pt is still complaining of UTI symptoms and says urine is dark and has a strong smell.

## 2019-12-31 NOTE — Telephone Encounter (Signed)
May bring urine Monday if gets worse.  Go to urgent care if symptoms worsen.  Future lab orders in.

## 2019-12-31 NOTE — Telephone Encounter (Signed)
Needs to bring [place] a urine

## 2019-12-31 NOTE — Telephone Encounter (Signed)
Back up provider, Dr. Warrick Parisian sent in medicine. Please advise.

## 2020-01-07 ENCOUNTER — Ambulatory Visit: Payer: Medicare Other | Admitting: Family

## 2020-01-07 ENCOUNTER — Ambulatory Visit: Payer: Medicare Other | Admitting: Family Medicine

## 2020-01-10 ENCOUNTER — Other Ambulatory Visit: Payer: Medicare Other

## 2020-01-10 DIAGNOSIS — R82998 Other abnormal findings in urine: Secondary | ICD-10-CM

## 2020-01-10 LAB — URINALYSIS, COMPLETE
Bilirubin, UA: NEGATIVE
Glucose, UA: NEGATIVE
Ketones, UA: NEGATIVE
Nitrite, UA: NEGATIVE
Protein,UA: NEGATIVE
RBC, UA: NEGATIVE
Specific Gravity, UA: 1.01 (ref 1.005–1.030)
Urobilinogen, Ur: 0.2 mg/dL (ref 0.2–1.0)
pH, UA: 7 (ref 5.0–7.5)

## 2020-01-10 LAB — MICROSCOPIC EXAMINATION: Renal Epithel, UA: NONE SEEN /hpf

## 2020-01-12 ENCOUNTER — Emergency Department (HOSPITAL_COMMUNITY)
Admission: EM | Admit: 2020-01-12 | Discharge: 2020-01-12 | Disposition: A | Discharge status: Home / Self Care | Payer: Medicare Other | Attending: Emergency Medicine | Admitting: Emergency Medicine

## 2020-01-12 ENCOUNTER — Other Ambulatory Visit: Payer: Self-pay

## 2020-01-12 ENCOUNTER — Emergency Department (HOSPITAL_COMMUNITY): Payer: Medicare Other

## 2020-01-12 ENCOUNTER — Encounter (HOSPITAL_COMMUNITY): Payer: Self-pay

## 2020-01-12 DIAGNOSIS — I5022 Chronic systolic (congestive) heart failure: Secondary | ICD-10-CM | POA: Diagnosis not present

## 2020-01-12 DIAGNOSIS — Z79899 Other long term (current) drug therapy: Secondary | ICD-10-CM | POA: Insufficient documentation

## 2020-01-12 DIAGNOSIS — N939 Abnormal uterine and vaginal bleeding, unspecified: Secondary | ICD-10-CM | POA: Diagnosis not present

## 2020-01-12 DIAGNOSIS — D259 Leiomyoma of uterus, unspecified: Secondary | ICD-10-CM | POA: Diagnosis not present

## 2020-01-12 DIAGNOSIS — I110 Hypertensive heart disease with heart failure: Secondary | ICD-10-CM | POA: Insufficient documentation

## 2020-01-12 DIAGNOSIS — K5730 Diverticulosis of large intestine without perforation or abscess without bleeding: Secondary | ICD-10-CM | POA: Diagnosis not present

## 2020-01-12 DIAGNOSIS — N3001 Acute cystitis with hematuria: Secondary | ICD-10-CM | POA: Diagnosis not present

## 2020-01-12 DIAGNOSIS — N3289 Other specified disorders of bladder: Secondary | ICD-10-CM | POA: Diagnosis not present

## 2020-01-12 DIAGNOSIS — R102 Pelvic and perineal pain: Secondary | ICD-10-CM | POA: Diagnosis not present

## 2020-01-12 DIAGNOSIS — R109 Unspecified abdominal pain: Secondary | ICD-10-CM | POA: Diagnosis present

## 2020-01-12 DIAGNOSIS — N858 Other specified noninflammatory disorders of uterus: Secondary | ICD-10-CM

## 2020-01-12 DIAGNOSIS — I4891 Unspecified atrial fibrillation: Secondary | ICD-10-CM | POA: Diagnosis not present

## 2020-01-12 LAB — CBC WITH DIFFERENTIAL/PLATELET
Abs Immature Granulocytes: 0.04 10*3/uL (ref 0.00–0.07)
Basophils Absolute: 0.1 10*3/uL (ref 0.0–0.1)
Basophils Relative: 1 %
Eosinophils Absolute: 0.2 10*3/uL (ref 0.0–0.5)
Eosinophils Relative: 2 %
HCT: 46.5 % — ABNORMAL HIGH (ref 36.0–46.0)
Hemoglobin: 15.2 g/dL — ABNORMAL HIGH (ref 12.0–15.0)
Immature Granulocytes: 0 %
Lymphocytes Relative: 22 %
Lymphs Abs: 2.2 10*3/uL (ref 0.7–4.0)
MCH: 29.7 pg (ref 26.0–34.0)
MCHC: 32.7 g/dL (ref 30.0–36.0)
MCV: 90.8 fL (ref 80.0–100.0)
Monocytes Absolute: 0.8 10*3/uL (ref 0.1–1.0)
Monocytes Relative: 8 %
Neutro Abs: 6.5 10*3/uL (ref 1.7–7.7)
Neutrophils Relative %: 67 %
Platelets: 250 10*3/uL (ref 150–400)
RBC: 5.12 MIL/uL — ABNORMAL HIGH (ref 3.87–5.11)
RDW: 14.4 % (ref 11.5–15.5)
WBC: 9.8 10*3/uL (ref 4.0–10.5)
nRBC: 0 % (ref 0.0–0.2)

## 2020-01-12 LAB — COMPREHENSIVE METABOLIC PANEL
ALT: 23 U/L (ref 0–44)
AST: 23 U/L (ref 15–41)
Albumin: 3.6 g/dL (ref 3.5–5.0)
Alkaline Phosphatase: 91 U/L (ref 38–126)
Anion gap: 11 (ref 5–15)
BUN: 16 mg/dL (ref 8–23)
CO2: 28 mmol/L (ref 22–32)
Calcium: 8.9 mg/dL (ref 8.9–10.3)
Chloride: 97 mmol/L — ABNORMAL LOW (ref 98–111)
Creatinine, Ser: 1 mg/dL (ref 0.44–1.00)
GFR calc Af Amer: >60 mL/min (ref >60)
GFR calc non Af Amer: 53 mL/min — ABNORMAL LOW (ref >60)
Glucose, Bld: 149 mg/dL — ABNORMAL HIGH (ref 70–99)
Potassium: 4.1 mmol/L (ref 3.5–5.1)
Sodium: 136 mmol/L (ref 135–145)
Total Bilirubin: 0.6 mg/dL (ref 0.3–1.2)
Total Protein: 7 g/dL (ref 6.5–8.1)

## 2020-01-12 LAB — URINALYSIS, ROUTINE W REFLEX MICROSCOPIC
Bilirubin Urine: NEGATIVE
Glucose, UA: NEGATIVE mg/dL
Ketones, ur: NEGATIVE mg/dL
Nitrite: NEGATIVE
Protein, ur: NEGATIVE mg/dL
Specific Gravity, Urine: 1.004 — ABNORMAL LOW (ref 1.005–1.030)
pH: 7 (ref 5.0–8.0)

## 2020-01-12 LAB — WET PREP, GENITAL
Clue Cells Wet Prep HPF POC: NONE SEEN
Sperm: NONE SEEN
Trich, Wet Prep: NONE SEEN
Yeast Wet Prep HPF POC: NONE SEEN

## 2020-01-12 LAB — LIPASE, BLOOD: Lipase: 29 U/L (ref 11–51)

## 2020-01-12 MED ORDER — CIPROFLOXACIN HCL 250 MG PO TABS
500.0000 mg | ORAL_TABLET | Freq: Once | ORAL | Status: AC
  Administered 2020-01-12: 500 mg via ORAL
  Filled 2020-01-12: qty 2

## 2020-01-12 MED ORDER — IOHEXOL 300 MG/ML  SOLN
100.0000 mL | Freq: Once | INTRAMUSCULAR | Status: AC | PRN
  Administered 2020-01-12: 100 mL via INTRAVENOUS

## 2020-01-12 MED ORDER — CIPROFLOXACIN HCL 500 MG PO TABS
500.0000 mg | ORAL_TABLET | Freq: Two times a day (BID) | ORAL | 0 refills | Status: DC
Start: 2020-01-12 — End: 2020-03-24

## 2020-01-12 MED ORDER — CEPHALEXIN 500 MG PO CAPS: 500.0000 mg | ORAL_CAPSULE | Freq: Once | ORAL | Status: DC

## 2020-01-12 NOTE — Discharge Instructions (Addendum)
Your test today show that you have a fluid collection at your uterus.  This will need further evaluation.  I have [facility] and he will see you in his office for follow-up.  You may call his office to arrange an appointment time.  I have also scheduled you to return here for an ultrasound of your pelvis.  You will need to call the scheduling department to arrange for an appointment time.  You also have a likely urinary tract infection.  It is important that you take the antibiotics as directed until they are finished.

## 2020-01-12 NOTE — ED Provider Notes (Addendum)
[facility] EMERGENCY DEPARTMENT Provider Note   CSN: [id number] Arrival date & time: 01/12/20  1004     History Chief Complaint  Patient presents with  . Abdominal Pain  . Vaginal Bleeding    Virginia Young is a 79 y.o. female.  HPI      Virginia Young is a 79 y.o. female with past medical history of atrial fibrillation anticoagulated, mitral regurgitation, nonischemic cardiomyopathy, CHF, chronic right shoulder pain and weakness of her right arm secondary to rotator cuff surgery, who presents to the Emergency Department complaining of abdominal pain and vaginal bleeding symptoms have been present for 1 week.  Gradually worsening.  She states that she was recently treated with antibiotics for UTI, but does not feel her symptoms improved.  Pain of her abdomen is worse in the right lower quadrant area which prompted her to seek emergency evaluation today.  No nausea vomiting or diarrhea.  She describes seeing "pinkish blood with mucus" post void.  No fever or chills.  No decreased appetite.  She also reports having worsening numbness of her right arm.  She follows with orthopedics regarding her shoulder and has been told in the past that the weakness of her right arm is a result of her prior surgery.  No headache or dizziness.  No decreased sensation of her right arm.  Patient lives alone, but has a aide who stays with her during the day.    Past Medical History:  Diagnosis Date  . Allergy   . Atrial fibrillation (HCC)    coumadin  . Cataract   . Chest pain 03/2012   a. Lex MV 10/13:  EF 64%, dist ant and apical defect sugg of soft tissue atten, no ischemia  . Chronic systolic heart failure ([place])   . Depression   . GERD (gastroesophageal reflux disease)   . HLD (hyperlipidemia)   . Mild mitral regurgitation   . MS (multiple sclerosis) ([place])   . NICM (nonischemic cardiomyopathy) ([place])    a. neg CLite in 2003;  b. EF 40-45% in past;   c.  Echo 12/11: EF 35-40%, mild MR,  mild LAE, mild RAE, small pericardial effusion   . Osteoarthritis   . Osteoporosis   . Stasis ulcer ([place])   . Vaginal prolapse without uterine prolapse   . Varicose veins     Patient Active Problem List   Diagnosis Date Noted  . Postmenopausal 08/11/2019  . Vitamin D deficiency 02/17/2019  . Constipation 02/17/2019  . Witnessed seizure-like activity ([place]) 02/03/2019  . Chronic systolic HF (heart failure) ([place]) 10/08/2018  . Educated about COVID-19 virus infection 10/08/2018  . Dementia with behavioral disturbance ([place]) 02/10/2018  . S/P shoulder replacement, right 08/22/2017  . HTN (hypertension) 02/19/2016  . Post-nasal drip 05/15/2015  . Postoperative abdominal pain 12/07/2014  . Voiding dysfunction 10/07/2014  . SUI (stress urinary incontinence, female) 10/07/2014  . Osteoporosis with fracture 04/06/2014  . Impacted cerumen of both ears 08/20/2013  . DOE (dyspnea on exertion) 07/23/2013  . Helicobacter positive gastritis 06/21/2013  . Orthostatic hypotension 06/10/2013  . UTI (lower urinary tract infection) 05/06/2013  . Neck mass 02/19/2013  . Neck pain, bilateral 02/19/2013  . Right groin mass 11/08/2011  . Low back pain 11/08/2011  . Lipoma of neck 06/21/2011  . Memory loss 06/21/2011  . OVERACTIVE BLADDER 08/21/2010  . Long term current use of anticoagulant 07/25/2010  . Osteoarthritis 01/18/2010  . KNEE PAIN 01/18/2010  . FOOT PAIN 01/18/2010  . Mixed  hyperlipidemia 04/20/2009  . GERD 04/17/2009  . MITRAL REGURGITATION, MILD 08/30/2008  . NICM (nonischemic cardiomyopathy) ([place]) 08/30/2008  . Congestive heart failure ([place]) 04/13/2007  . DEPRESSION 04/10/2007  . DISEASE, MITRAL VALVE NEC/NOS 04/10/2007  . Atrial fibrillation ([place]) 04/10/2007  . STASIS ULCER 04/10/2007  . VENOUS INSUFFICIENCY, LEGS 04/10/2007    Past Surgical History:  Procedure Laterality Date  . ANTERIOR AND POSTERIOR VAGINAL REPAIR W/ SACROSPINOUS LIGAMENT SUSPENSION  2016   [facility]  .  BLADDER SURGERY    . CARPAL TUNNEL RELEASE Right 08/22/2017   Procedure: CARPAL TUNNEL RELEASE;  Surgeon: [name], MD;  Location: [facility];  Service: Orthopedics;  Laterality: Right;  . cataract surgery Bilateral   . EYE SURGERY    . INGUINAL HERNIA REPAIR     right  . LIPOMA EXCISION     h/o removed from upper back x 2  . REVERSE SHOULDER ARTHROPLASTY Right 08/22/2017   Procedure: REVERSE SHOULDER ARTHROPLASTY;  Surgeon: [name], MD;  Location: [facility];  Service: Orthopedics;  Laterality: Right;     OB History    Gravida      Para      Term      Preterm      AB      Living  3     SAB      TAB      Ectopic      Multiple      Live Births              Family History  Problem Relation Age of Onset  . Diabetes Father 70       diabetes and syncope  . Heart attack Father   . Heart attack Sister   . Heart attack Brother        all 5 brothers had MIs  . Stroke Brother   . Cancer Sister        bone  . Diabetes Sister   . Cancer Sister   . Diabetes Sister   . Heart attack Brother   . Dementia Mother   . Dementia Brother   . CAD Other   . Diabetes Daughter   . Colon cancer Neg Hx   . Neuropathy Neg Hx     Social History   Tobacco Use  . Smoking status: Never Smoker  . Smokeless tobacco: Never Used  Vaping Use  . Vaping Use: Never used  Substance Use Topics  . Alcohol use: No  . Drug use: No    Home Medications Prior to Admission medications   Medication Sig Start Date End Date Taking? Authorizing Provider  calcium-vitamin D (OSCAL WITH D) 500-200 MG-UNIT tablet Take 1 tablet by mouth 2 (two) times daily. 08/12/19 11/10/19  Baruch Gouty, FNP  cephALEXin (KEFLEX) 500 MG capsule Take 1 capsule (500 mg total) by mouth 4 (four) times daily. 12/22/19   Dettinger, Fransisca Kaufmann, MD  CVS D3 25 MCG (1000 UT) capsule TAKE 1 CAPSULE (1,000 UNITS TOTAL) BY MOUTH DAILY. 09/27/19   Baruch Gouty, FNP  divalproex (DEPAKOTE ER) 250 MG 24 hr tablet Take 1 tablet  (250 mg total) by mouth daily. 12/22/19   Lomax, Amy, NP  ELIQUIS 5 MG TABS tablet TAKE 1 TABLET BY MOUTH EVERY 12 HOURS 12/21/19   Hassell Done, Mary-Margaret, FNP  furosemide (LASIX) 20 MG tablet Take 3 tablets (60 mg total) by mouth daily. 11/10/19   Minus Breeding, MD  linaclotide Rolan Lipa) 145 MCG CAPS capsule TAKE 1  CAPSULE (145 MCG TOTAL) BY MOUTH DAILY BEFORE BREAKFAST. 11/29/19   Evelina Dun A, FNP  losartan (COZAAR) 25 MG tablet TAKE 1/2 TABLET BY MOUTH EVERY DAY 09/21/19   Minus Breeding, MD  memantine (NAMENDA) 5 MG tablet Take 1 tablet (5 mg total) by mouth 2 (two) times daily. 11/24/19   Lomax, Amy, NP  metoprolol tartrate (LOPRESSOR) 25 MG tablet Take 1 tablet (25 mg total) by mouth 2 (two) times daily. 09/06/19 12/05/19  Minus Breeding, MD  Multiple Vitamin (MULTIVITAMIN) tablet Take 1 tablet by mouth daily.    [provider]  OXYGEN Inhale into the lungs. 2 liters PRN - anytime when needed ( cardio rx'd)    [provider]  QUEtiapine (SEROQUEL) 50 MG tablet TAKE 1 TABLET BY MOUTH TWICE A DAY 07/02/19   Lomax, Amy, NP    Allergies    Bactrim [sulfamethoxazole-trimethoprim], Lipitor [atorvastatin calcium], and Lisinopril  Review of Systems   Review of Systems  Constitutional: Negative for activity change, appetite change and fever.  Eyes: Negative for visual disturbance.  Respiratory: Negative for cough, chest tightness and shortness of breath.   Cardiovascular: Negative for chest pain.  Gastrointestinal: Positive for abdominal pain. Negative for diarrhea, nausea and vomiting.  Genitourinary: Positive for dysuria and vaginal bleeding.  Musculoskeletal: Positive for arthralgias (right shoulder pain). Negative for neck pain and neck stiffness.  Skin: Negative for rash.  Neurological: Positive for weakness (right arm weakness). Negative for dizziness, syncope, facial asymmetry, speech difficulty, numbness and headaches.    Physical Exam Updated Vital Signs BP (!)  147/85 (BP Location: Right Arm)   Pulse 85   Temp 98.3 F (36.8 C) (Oral)   Resp 18   Ht 5\' 10"  (1.778 m)   Wt 90 kg   SpO2 95%   BMI 28.47 kg/m   Physical Exam Vitals and nursing note reviewed.  Constitutional:      General: She is not in acute distress.    Appearance: She is well-developed. She is not ill-appearing.  HENT:     Mouth/Throat:     Mouth: Mucous membranes are dry.  Cardiovascular:     Rate and Rhythm: Normal rate. Rhythm irregular.     Pulses: Normal pulses.  Pulmonary:     Effort: Pulmonary effort is normal. No respiratory distress.     Breath sounds: Normal breath sounds. No wheezing.  Chest:     Chest wall: No tenderness.  Abdominal:     Palpations: Abdomen is soft.     Tenderness: There is no abdominal tenderness.     Comments: Tenderness palpation of the right mid to lower abdomen, worse in the right lower quadrant.  Mild guarding present.  Abdomen is soft no distention.  No CVA tenderness  Genitourinary:    Vagina: No bleeding.     Comments: Speculum exam performed by me, milky vaginal discharge present, no vaginal bleeding noted on exam. Musculoskeletal:        General: Normal range of motion.     Cervical back: Normal range of motion. No rigidity or tenderness.     Right lower leg: No edema.     Left lower leg: No edema.  Skin:    General: Skin is warm.     Capillary Refill: Capillary refill takes less than 2 seconds.     Findings: No rash.  Neurological:     Mental Status: She is alert.     GCS: GCS eye subscore is 4. GCS verbal subscore is 5. GCS  motor subscore is 6.     Cranial Nerves: No facial asymmetry.     Sensory: Sensation is intact.     Motor: Weakness present. No abnormal muscle tone or pronator drift.     Comments: CN II-XII grossly intact.  Speech clear.  Patient has diminished grip strength on the right compared to the left, no pronator drift.  No focal weakness of the LE's.   No dysarthria     ED Results / Procedures /  Treatments   Labs (all labs ordered are listed, but only abnormal results are displayed) Labs Reviewed  WET PREP, GENITAL - Abnormal; Notable for the following components:      Result Value   WBC, Wet Prep HPF POC MODERATE (*)    All other components within normal limits  CBC WITH DIFFERENTIAL/PLATELET - Abnormal; Notable for the following components:   RBC 5.12 (*)    Hemoglobin 15.2 (*)    HCT 46.5 (*)    All other components within normal limits  COMPREHENSIVE METABOLIC PANEL - Abnormal; Notable for the following components:   Chloride 97 (*)    Glucose, Bld 149 (*)    GFR calc non Af Amer 53 (*)    All other components within normal limits  URINALYSIS, ROUTINE W REFLEX MICROSCOPIC - Abnormal; Notable for the following components:   Color, Urine STRAW (*)    Specific Gravity, Urine 1.004 (*)    Hgb urine dipstick SMALL (*)    Leukocytes,Ua LARGE (*)    Bacteria, UA MANY (*)    All other components within normal limits  URINE CULTURE  LIPASE, BLOOD    EKG EKG Interpretation  Date/Time:  Wednesday January 12 2020 11:31:23 EDT Ventricular Rate:  68 PR Interval:    QRS Duration: 97 QT Interval:  376 QTC Calculation: 400 R Axis:   -33 Text Interpretation: Atrial fibrillation Ventricular premature complex Inferior infarct, old Anterior infarct, old Confirmed by Noemi Chapel [id number]) on 01/12/2020 4:23:11 PM   Radiology   CT ABDOMEN AND PELVIS WITH CONTRAST  TECHNIQUE: Multidetector CT imaging of the abdomen and pelvis was performed using the standard protocol following bolus administration of intravenous contrast.  CONTRAST: 178mL OMNIPAQUE IOHEXOL 300 MG/ML SOLN  COMPARISON: 05/08/2017  FINDINGS: Lower chest: No acute abnormality.  Hepatobiliary: No focal liver abnormality is seen. No gallstones, gallbladder wall thickening, or biliary dilatation.  Pancreas: Unremarkable. No pancreatic ductal dilatation or surrounding inflammatory changes.  Spleen: Normal in  size without focal abnormality.  Adrenals/Urinary Tract: Adrenal glands are within normal limits. Kidneys demonstrate a normal enhancement pattern bilaterally. Delayed images demonstrate normal excretion bilaterally. No obstructive changes are seen. The bladder is partially distended.  Stomach/Bowel: Colon shows scattered diverticular change without evidence of diverticulitis. No obstructive changes are seen. The appendix is not well visualized. Small bowel and stomach appear within normal limits.  Vascular/Lymphatic: Aortic atherosclerosis. No enlarged abdominal or pelvic lymph nodes.  Reproductive: Uterus demonstrates multiple calcified uterine fibroid stable from the prior exam. Additionally however there is a 4.0 x 1.9 cm fluid attenuation region within the uterus. Given the patient's age this is highly suspicious for uterine neoplasm. Further evaluation is recommended.  Other: No free fluid is noted.  Musculoskeletal: Degenerative changes of the lumbar spine are noted. S-shaped scoliosis is noted involving the thoracolumbar spine.  IMPRESSION: Fluid attenuation area within the endometrium of the uterus suspicious for endometrial neoplasm. Further evaluation is Recommended.  Procedures Procedures (including critical care time)  Medications Ordered in ED Medications -  No data to display  ED Course  I have reviewed the triage vital signs and the nursing notes.  Pertinent labs & imaging results that were available during my care of the patient were reviewed by me and considered in my medical decision making (see chart for details).    MDM Rules/Calculators/A&P                          Patient is a well-appearing 79 year old female.  Right lower quadrant pain with dysuria symptoms and reported vaginal bleeding.  Recently completed course of Keflex without relief of symptoms.  I do not appreciate any vaginal bleeding on speculum exam.  Urinalysis does show likely UTI.   Urine culture pending.  Patient had urine culture from 01/10/2020 that shows preliminary report of Pseudomonas.  laboratory studies do not show leukocytosis, no reported fever, vomiting or diarrhea.  Electrolytes reassuring.  Patient also reporting right arm numbness and shoulder pain that is chronic due to prior rotator cuff surgery.  She does have some minimal grip strength weakness on the right as compared to the left, no facial weakness, aphasia, visual change, headache, or lower extremity weakness. This is not felt to be an acute finding, review of medical records support chronic right arm weakness dated back to 2019  Pt also seen by Dr. Sedonia Small and pt care plan discussed   CT of the abdomen pelvis shows fluid collection within the endometrium of the uterus that is suspicious for endometrial neoplasm.  Will consult OB/GYN.  [place], Dr. Glo Herring who reviewed CT findings.  He recommends to schedule ultrasound of the pelvis outpatient and he will provide close follow-up in the office for likely biopsy.  I have discussed these findings with the patient and caregiver with the patient's permission  Out patient pelvic US scheduled.   Final Clinical Impression(s) / ED Diagnoses Final diagnoses:  Pelvic pain  Acute cystitis with hematuria    Rx / DC Orders ED Discharge Orders    None       Kem Parkinson, PA-C 01/12/20 1954    Kem Parkinson, PA-C 01/12/20 1956    Maudie Flakes, MD 01/20/20 [phone]

## 2020-01-12 NOTE — Consult Note (Signed)
GYN telephone consultation requested by ED PA regarding this 79 year old female with apparent postmenopausal bleeding episode.  Speculum exam was reported as showing no visible blood per vagina at this time.  CT of the abdomen shows a large bladder deviated to the patient's right [place] small uterus with a fluid collection in the endometrial cavity, 3 to 4 cm diameter.  This could represent a hematometra or pyometra.  White count is normal, with no left shift. I recommended that patient has clinically stable as it appears remotely, that I would asked that she be scheduled for a pelvic ultrasound with transvaginal probe and then have her caregiver make an appointment in our office for attempted endometrial biopsy

## 2020-01-12 NOTE — ED Triage Notes (Addendum)
Pt reports vaginal bleeding and abd cramping for the past several days.  Also reports r arm has been numb and heavy for the past several months.  Caregiver reports pt diagnosed with uti 2 weeks ago and finished her antibiotics.

## 2020-01-13 ENCOUNTER — Ambulatory Visit (HOSPITAL_COMMUNITY)
Admission: RE | Admit: 2020-01-13 | Discharge: 2020-01-13 | Disposition: A | Discharge status: Home / Self Care | Payer: Medicare Other | Source: Ambulatory Visit | Attending: Emergency Medicine | Admitting: Emergency Medicine

## 2020-01-13 DIAGNOSIS — R102 Pelvic and perineal pain: Secondary | ICD-10-CM | POA: Insufficient documentation

## 2020-01-13 DIAGNOSIS — D259 Leiomyoma of uterus, unspecified: Secondary | ICD-10-CM | POA: Insufficient documentation

## 2020-01-13 LAB — URINE CULTURE

## 2020-01-15 LAB — URINE CULTURE: Culture: >=100000 — AB

## 2020-01-18 NOTE — Progress Notes (Signed)
PATIENT ID: Virginia Young, female     DOB: 1941-04-06, 79 y.o.     MRN: [id number]   [facility] Visit  01/18/20     PATIENT NAME: Virginia Young     MRN [id number]     DOB: 03-May-1941  CC & HPI:  No chief complaint on file.  Virginia Young is a 79 y.o. female presenting today for abnormal vaginal bleeding during menopause.   She presenteded to the ED on 01/12/2020 with complaints of vaginal bleeding and abdominal cramping for several days. She was diagnosed with UTI two weeks prior and given antibiotics, which were finished but did not help with her symptoms. Speculum exam reported no visible blood per vagina at that time. CT of the abdomen shows a large bladder deviated to the patient's right , small uterus with a fluid collection in the endometrial cavity, 3 to 4 cm diameter.   TA/TV Ultrasound of the pelvis completed on 01/13/2020 revealed a 2.1 cm fibroid on the right lateral uterine wall with an Irregular endometrium with fluid-filled cavity.  ROS:  Review of Systems  Constitutional: Negative.   HENT: Negative.   Eyes: Negative.   Respiratory: Negative.   Cardiovascular: Negative.   Gastrointestinal: Negative.   Genitourinary: Negative.   Musculoskeletal: Negative.   Skin: Negative.   Neurological: Negative.   Endo/Heme/Allergies: Negative.   Psychiatric/Behavioral: Negative.   All other systems reviewed and are negative.  01/12/2020 CT abdomen and Pelvis: Fluid attenuation area within the endometrium of the uterus suspicious for endometrial neoplasm. Further evaluation is recommended. Uterine fibroid stable from the prior exam. Diverticulosis without diverticulitis.  01/13/2020 TA/TV Pelvic US: 2.1 cm fibroid noted the right lateral uterine wall. Irregular endometrium with fluid-filled cavity. Findings suggest the possibility of an endometrial malignancy. Gynecologic evaluation suggested.  Pertinent History Reviewed:  Reviewed: Significant for VAGINAL  [facility]         Past Medical History:  Diagnosis Date  . Allergy   . Atrial fibrillation (HCC)    coumadin  . Cataract   . Chest pain 03/2012   a. Lex MV 10/13:  EF 64%, dist ant and apical defect sugg of soft tissue atten, no ischemia  . Chronic systolic heart failure ([place])   . Depression   . GERD (gastroesophageal reflux disease)   . HLD (hyperlipidemia)   . Mild mitral regurgitation   . MS (multiple sclerosis) ([place])   . NICM (nonischemic cardiomyopathy) ([place])    a. neg CLite in 2003;  b. EF 40-45% in past;   c.  Echo 12/11: EF 35-40%, mild MR, mild LAE, mild RAE, small pericardial effusion   . Osteoarthritis   . Osteoporosis   . Stasis ulcer ([place])   . Vaginal prolapse without uterine prolapse   . Varicose veins                               Surgical Hx:    Past Surgical History:  Procedure Laterality Date  . ANTERIOR AND POSTERIOR VAGINAL REPAIR W/ SACROSPINOUS LIGAMENT SUSPENSION  2016   [facility]  . BLADDER SURGERY    . CARPAL TUNNEL RELEASE Right 08/22/2017   Procedure: CARPAL TUNNEL RELEASE;  Surgeon: [name], MD;  Location: [facility];  Service: Orthopedics;  Laterality: Right;  . cataract surgery Bilateral   . EYE SURGERY    . INGUINAL HERNIA REPAIR     right  . LIPOMA EXCISION  h/o removed from upper back x 2  . REVERSE SHOULDER ARTHROPLASTY Right 08/22/2017   Procedure: REVERSE SHOULDER ARTHROPLASTY;  Surgeon: [name], MD;  Location: [facility];  Service: Orthopedics;  Laterality: Right;   Medications: Reviewed & Updated - see associated section                       Current Outpatient Medications:  .  calcium-vitamin D (OSCAL WITH D) 500-200 MG-UNIT tablet, Take 1 tablet by mouth 2 (two) times daily., Disp: 180 tablet, Rfl: 3 .  cephALEXin (KEFLEX) 500 MG capsule, Take 1 capsule (500 mg total) by mouth 4 (four) times daily. (Patient not taking: Reported on 01/12/2020), Disp: 28 capsule, Rfl: 0 .  ciprofloxacin (CIPRO) 500 MG tablet, Take 1 tablet (500 mg  total) by mouth every 12 (twelve) hours., Disp: 14 tablet, Rfl: 0 .  [employer] D3 25 MCG (1000 UT) capsule, TAKE 1 CAPSULE (1,000 UNITS TOTAL) BY MOUTH DAILY., Disp: 90 capsule, Rfl: 1 .  diclofenac Sodium (VOLTAREN) 1 % GEL, Apply 2 g topically daily as needed., Disp: , Rfl:  .  divalproex (DEPAKOTE ER) 250 MG 24 hr tablet, Take 1 tablet (250 mg total) by mouth daily., Disp: 90 tablet, Rfl: 3 .  ELIQUIS 5 MG TABS tablet, TAKE 1 TABLET BY MOUTH EVERY 12 HOURS, Disp: 60 tablet, Rfl: 1 .  furosemide (LASIX) 20 MG tablet, Take 3 tablets (60 mg total) by mouth daily., Disp: 90 tablet, Rfl: 11 .  linaclotide (LINZESS) 145 MCG CAPS capsule, TAKE 1 CAPSULE (145 MCG TOTAL) BY MOUTH DAILY BEFORE BREAKFAST., Disp: 90 capsule, Rfl: 0 .  losartan (COZAAR) 25 MG tablet, TAKE 1/2 TABLET BY MOUTH EVERY DAY, Disp: 45 tablet, Rfl: 3 .  memantine (NAMENDA) 5 MG tablet, Take 1 tablet (5 mg total) by mouth 2 (two) times daily., Disp: 180 tablet, Rfl: 3 .  metoprolol tartrate (LOPRESSOR) 25 MG tablet, Take 1 tablet (25 mg total) by mouth 2 (two) times daily., Disp: 180 tablet, Rfl: 3 .  Multiple Vitamin (MULTIVITAMIN) tablet, Take 1 tablet by mouth daily., Disp: , Rfl:  .  OXYGEN, Inhale into the lungs. 2 liters PRN - anytime when needed ( cardio rx'd), Disp: , Rfl:  .  QUEtiapine (SEROQUEL) 50 MG tablet, TAKE 1 TABLET BY MOUTH TWICE A DAY, Disp: 180 tablet, Rfl: 3   Social History: Reviewed -  reports that she has never smoked. She has never used smokeless tobacco.  Objective Findings:  Vitals: There were no vitals taken for this visit.  PHYSICAL EXAMINATION General appearance - alert, well appearing, and in no distress, oriented to person, place, and time, overweight, ill-appearing and chronically ill appearing Mental status - alert, oriented to person, place, and time, normal mood, behavior, speech, dress, motor activity, and thought processes Chest - not examined Heart - normal rate, regular rhythm, normal S1, S2,  no murmurs, rubs, clicks or gallops, not examined Abdomen - soft, nontender, nondistended, no masses or organomegaly Breasts -  Skin - normal coloration and turgor, no rashes, no suspicious skin lesions noted  PELVIC External genitalia - A appropriate for age Vulva -normal for age Vagina -minimal discharge Cervix -atrophic  Uterus -sounded in the anteflexed position to 10 cm  Adnexa -  [place]et [place] -  Rectal - rectal exam not indicated  Procedure note" Patient given informed consent, signed copy in the chart, time out was performed. Appropriate time out taken. . The patient was placed in the lithotomy  position and the cervix brought into view with sterile speculum.  Portio of cervix cleansed x 2 with betadine swabs.  A tenaculum was placed in the anterior lip of the cervix.  The uterus was sounded for depth of 10. A pipelle was introduced to into the uterus, suction created, and suction initially immediately pulled out 10 cc of malodorous purulent material, pyometra and after this  an endometrial sample was obtained. All equipment was removed and accounted for.  The patient tolerated the procedure well.    Patient given post procedure instructions. The patient will return in 2 weeks for results.  Assessment & Plan:   A:  1. Pyometra  P:  1. Prescription sent in for doxycycline and metronidazole x7 days 2. Follow-up endometrial biopsy   By signing my name below, I, [employer], attest that this documentation has been prepared under the direction and in the presence of Jonnie Kind, MD. Electronically Signed: [place]. 01/18/20. 11:22 PM.  I personally performed the services described in this documentation, which was SCRIBED in my presence. The recorded information has been reviewed and considered accurate. It has been edited as necessary during review. Jonnie Kind, MD

## 2020-01-19 ENCOUNTER — Encounter: Payer: Self-pay | Admitting: Family

## 2020-01-19 ENCOUNTER — Other Ambulatory Visit: Payer: Self-pay

## 2020-01-19 ENCOUNTER — Encounter: Payer: Self-pay | Admitting: Obstetrics and Gynecology

## 2020-01-19 ENCOUNTER — Ambulatory Visit (INDEPENDENT_AMBULATORY_CARE_PROVIDER_SITE_OTHER): Payer: Medicare Other | Admitting: Obstetrics and Gynecology

## 2020-01-19 ENCOUNTER — Ambulatory Visit (INDEPENDENT_AMBULATORY_CARE_PROVIDER_SITE_OTHER): Payer: Medicare Other | Admitting: Family

## 2020-01-19 VITALS — Ht 70.0 in | Wt 214.0 lb

## 2020-01-19 VITALS — BP 124/69 | HR 73 | Temp 97.2°F | Ht 70.0 in | Wt 214.6 lb

## 2020-01-19 DIAGNOSIS — F321 Major depressive disorder, single episode, moderate: Secondary | ICD-10-CM

## 2020-01-19 DIAGNOSIS — I10 Essential (primary) hypertension: Secondary | ICD-10-CM

## 2020-01-19 DIAGNOSIS — I4821 Permanent atrial fibrillation: Secondary | ICD-10-CM | POA: Diagnosis not present

## 2020-01-19 DIAGNOSIS — Z7901 Long term (current) use of anticoagulants: Secondary | ICD-10-CM | POA: Diagnosis not present

## 2020-01-19 DIAGNOSIS — N950 Postmenopausal bleeding: Secondary | ICD-10-CM | POA: Diagnosis not present

## 2020-01-19 DIAGNOSIS — I5022 Chronic systolic (congestive) heart failure: Secondary | ICD-10-CM

## 2020-01-19 DIAGNOSIS — M159 Polyosteoarthritis, unspecified: Secondary | ICD-10-CM

## 2020-01-19 DIAGNOSIS — E782 Mixed hyperlipidemia: Secondary | ICD-10-CM

## 2020-01-19 DIAGNOSIS — Z09 Encounter for follow-up examination after completed treatment for conditions other than malignant neoplasm: Secondary | ICD-10-CM

## 2020-01-19 DIAGNOSIS — M8949 Other hypertrophic osteoarthropathy, multiple sites: Secondary | ICD-10-CM | POA: Diagnosis not present

## 2020-01-19 DIAGNOSIS — K219 Gastro-esophageal reflux disease without esophagitis: Secondary | ICD-10-CM

## 2020-01-19 DIAGNOSIS — Z780 Asymptomatic menopausal state: Secondary | ICD-10-CM

## 2020-01-19 DIAGNOSIS — F0281 Dementia in other diseases classified elsewhere with behavioral disturbance: Secondary | ICD-10-CM

## 2020-01-19 DIAGNOSIS — K5900 Constipation, unspecified: Secondary | ICD-10-CM

## 2020-01-19 DIAGNOSIS — N711 Chronic inflammatory disease of uterus: Secondary | ICD-10-CM | POA: Diagnosis not present

## 2020-01-19 DIAGNOSIS — F02818 Dementia in other diseases classified elsewhere, unspecified severity, with other behavioral disturbance: Secondary | ICD-10-CM

## 2020-01-19 DIAGNOSIS — G300 Alzheimer's disease with early onset: Secondary | ICD-10-CM

## 2020-01-19 DIAGNOSIS — R399 Unspecified symptoms and signs involving the genitourinary system: Secondary | ICD-10-CM

## 2020-01-19 LAB — URINALYSIS, COMPLETE
Bilirubin, UA: NEGATIVE
Glucose, UA: NEGATIVE
Ketones, UA: NEGATIVE
Nitrite, UA: NEGATIVE
Protein,UA: NEGATIVE
Specific Gravity, UA: 1.015 (ref 1.005–1.030)
Urobilinogen, Ur: 0.2 mg/dL (ref 0.2–1.0)
pH, UA: 7 (ref 5.0–7.5)

## 2020-01-19 LAB — MICROSCOPIC EXAMINATION: Epithelial Cells (non renal): NONE SEEN /hpf (ref 0–10)

## 2020-01-19 MED ORDER — LINACLOTIDE 72 MCG PO CAPS: 72.0000 ug | ORAL_CAPSULE | Freq: Every day | ORAL | 1 refills | Status: DC

## 2020-01-19 MED ORDER — DOXYCYCLINE HYCLATE 100 MG PO CAPS: 100.0000 mg | ORAL_CAPSULE | Freq: Two times a day (BID) | ORAL | 0 refills | Status: DC

## 2020-01-19 MED ORDER — METRONIDAZOLE 500 MG PO TABS
500.0000 mg | ORAL_TABLET | Freq: Two times a day (BID) | ORAL | 0 refills | Status: AC
Start: 2020-01-19 — End: 2020-01-26

## 2020-01-19 NOTE — Addendum Note (Signed)
Addended by: Janece Canterbury on: 01/19/2020 04:47 PM   Modules accepted: Orders

## 2020-01-19 NOTE — Patient Instructions (Signed)

## 2020-01-19 NOTE — Progress Notes (Signed)
Subjective:    Patient ID: Virginia Young, female    DOB: 1941-01-11, 79 y.o.   MRN: [id number]  Chief Complaint  Patient presents with  . Medical Management of Chronic Issues   Pt presents to the office today to establish care. She is followed by Cardiologists annually for A Fib, CHF, and HTN. She takes her Eliquis BID.  She is followed by Neurologists annually for dementia. She is followed by Ortho annually for osteoarthritis in her shoulders and knees.   She has an appt with GYN today. She was seen in the ED on 01/12/20 and had a CT scan that showed, " Fluid attenuation area within the endometrium of the uterus suspicious for endometrial neoplasm. Further evaluation is Recommended." Hypertension This is a chronic problem. The current episode started more than 1 year ago. The problem has been resolved since onset. The problem is controlled. Associated symptoms include malaise/fatigue, peripheral edema and shortness of breath. Risk factors for coronary artery disease include sedentary lifestyle and dyslipidemia. The current treatment provides moderate improvement. Hypertensive end-organ damage includes CAD/MI and heart failure.  Gastroesophageal Reflux She complains of belching and heartburn. This is a chronic problem. The current episode started more than 1 year ago. The problem occurs occasionally. The symptoms are aggravated by certain foods. Risk factors include obesity. She has tried an antacid for the symptoms. The treatment provided moderate relief.  Arthritis Presents for follow-up visit. She complains of pain and stiffness. The symptoms have been stable. Affected locations include the left shoulder, right shoulder, left knee and right knee. Her pain is at a severity of 5/10.  Constipation This is a chronic problem. The current episode started more than 1 year ago. The problem has been waxing and waning since onset. Her stool frequency is 2 to 3 times per week. She has tried  laxatives for the symptoms. The treatment provided mild relief.  Depression        This is a chronic problem.  The current episode started more than 1 year ago.   The onset quality is gradual. The problem is unchanged.  Associated symptoms include helplessness, hopelessness, irritable, restlessness and sad.     Review of Systems  Constitutional: Positive for malaise/fatigue.  Respiratory: Positive for shortness of breath.   Gastrointestinal: Positive for constipation and heartburn.  Musculoskeletal: Positive for arthritis and stiffness.  Psychiatric/Behavioral: Positive for depression.       Objective:   Physical Exam Vitals reviewed.  Constitutional:      General: She is irritable. She is not in acute distress.    Appearance: She is well-developed. She is obese.  HENT:     Head: Normocephalic and atraumatic.     Right Ear: Tympanic membrane normal.     Left Ear: Tympanic membrane normal.  Eyes:     Pupils: Pupils are equal, round, and reactive to light.  Neck:     Thyroid: No thyromegaly.  Cardiovascular:     Rate and Rhythm: Normal rate and regular rhythm.     Heart sounds: Normal heart sounds. No murmur heard.   Pulmonary:     Effort: Pulmonary effort is normal. No respiratory distress.     Breath sounds: Normal breath sounds. No wheezing.  Abdominal:     General: Bowel sounds are normal. There is no distension.     Palpations: Abdomen is soft.     Tenderness: There is no abdominal tenderness.  Musculoskeletal:        General: No tenderness. Normal  range of motion.     Cervical back: Normal range of motion and neck supple.     Right lower leg: Edema (trace) present.     Left lower leg: Edema (trace) present.  Skin:    General: Skin is warm and dry.  Neurological:     Mental Status: She is alert and oriented to person, place, and time.     Cranial Nerves: No cranial nerve deficit.     Motor: Weakness present.     Deep Tendon Reflexes: Reflexes are normal and  symmetric.     Comments: Generalized weakness using cane to walk  Psychiatric:        Behavior: Behavior normal.        Thought Content: Thought content normal.        Judgment: Judgment normal.     BP 124/69   Pulse 73   Temp (!) 97.2 F (36.2 C) (Temporal)   Ht 5\' 10"  (1.778 m)   Wt (!) 214 lb 9.6 oz (97.3 kg)   SpO2 97%   BMI 30.79 kg/m        Assessment & Plan:  Virginia Young comes in today with chief complaint of Medical Management of Chronic Issues   Diagnosis and orders addressed:  1. UTI symptoms - Urinalysis, Complete - Urine Culture  2. Permanent atrial fibrillation ([place])  3. Chronic systolic HF (heart failure) ([place])  4. Essential hypertension  5. Gastroesophageal reflux disease, unspecified whether esophagitis present  6. Early onset Alzheimer's disease with behavioral disturbance ([place])  7. Primary osteoarthritis involving multiple joint  8. Long term current use of anticoagulant  9. Mixed hyperlipidemia  10. Depression, major, single episode, moderate ([place])  11. Constipation, unspecified constipation type - Will decrease Linzess to 72 mcg from 145 mcg - linaclotide (LINZESS) 72 MCG capsule; Take 1 capsule (72 mcg total) by mouth daily before breakfast.  Dispense: 90 capsule; Refill: 1  12. Hospital discharge follow-up    Labs reviewed, stable CBC and CMP Health Maintenance reviewed Diet and exercise encouraged  Follow up plan: 6 months    Evelina Dun, FNP

## 2020-01-20 ENCOUNTER — Other Ambulatory Visit: Payer: Self-pay | Admitting: Nurse Practitioner

## 2020-01-20 DIAGNOSIS — I480 Paroxysmal atrial fibrillation: Secondary | ICD-10-CM

## 2020-01-20 DIAGNOSIS — N711 Chronic inflammatory disease of uterus: Secondary | ICD-10-CM | POA: Diagnosis not present

## 2020-01-20 DIAGNOSIS — Z780 Asymptomatic menopausal state: Secondary | ICD-10-CM | POA: Diagnosis not present

## 2020-01-20 LAB — URINE CULTURE

## 2020-01-24 LAB — WOUND CULTURE

## 2020-01-24 NOTE — Progress Notes (Signed)
Pyometra shows some gram positive cocci, not further identified.

## 2020-01-26 ENCOUNTER — Telehealth: Payer: Self-pay | Admitting: Obstetrics and Gynecology

## 2020-01-26 NOTE — Telephone Encounter (Signed)
Virginia Young called to F/u with Virginia Young He told her he would call her this week with results of the procedure done in office last week/ I advised pt that Virginia Young was not in the office this week but she wanted a call back from a nurse

## 2020-01-31 ENCOUNTER — Telehealth: Payer: Self-pay | Admitting: Obstetrics and Gynecology

## 2020-01-31 NOTE — Telephone Encounter (Signed)
Virginia Young was contacted , informed of inflammation only on biopsy, and says she has no discharge. She needs a f/u ultrasound now that she has completed her antibiotics.

## 2020-01-31 NOTE — Telephone Encounter (Signed)
Patient caregiver called in reference to procedure done last week for patient would like someone to follow up with patient in reference to the results.

## 2020-02-02 ENCOUNTER — Other Ambulatory Visit: Payer: Medicare Other

## 2020-02-08 ENCOUNTER — Telehealth: Payer: Medicare Other | Admitting: *Deleted

## 2020-02-08 ENCOUNTER — Telehealth: Payer: Self-pay | Admitting: *Deleted

## 2020-02-08 NOTE — Telephone Encounter (Signed)
  Chronic Care Management   Outreach Note  02/08/2020 Name: Virginia Young MRN: [id number] DOB: Aug 12, 1940  Referred by: Sharion Balloon, FNP Reason for referral : Chronic Care Management (Initial Visit)   An unsuccessful Initial Telephone Visit was attempted today. The patient was referred to the case management team for assistance with care management and care coordination.   Clinical Goals: . Over the next 10 days, patient will be contacted by a Care Guide to reschedule their Initial CCM Visit . Over the next 30 days, patient will have an Initial CCM Visit with a member of the embedded CCM team to discuss self-management of their chronic medical conditions  Interventions and Plan . Chart reviewed in preparation for initial visit telephone call . Collaboration with other care team members as needed . Unsuccessful outreach to patient  . A HIPAA compliant phone message was left for the patient providing contact information and requesting a return call.  . Request sent to care guides to reach out and reschedule patient's initial visit   Chong Sicilian, BSN, RN-BC [facility] Direct Dial: [phone]

## 2020-02-09 ENCOUNTER — Ambulatory Visit: Payer: Medicare Other | Admitting: Obstetrics and Gynecology

## 2020-02-10 ENCOUNTER — Other Ambulatory Visit: Payer: Medicare Other

## 2020-02-10 ENCOUNTER — Telehealth: Payer: Self-pay | Admitting: *Deleted

## 2020-02-10 NOTE — Chronic Care Management (AMB) (Signed)
  Chronic Care Management   Note  02/10/2020 Name: [name] MRN: [id number] DOB: [birth year]  Virginia Young is a 79 y.o. year old female who is a primary care patient of Sharion Balloon, FNP and is actively engaged with the care management team. I reached out to Virginia Young by phone today to assist with re-scheduling an initial visit with the RN Case Manager.  Follow up plan: Unsuccessful telephone outreach attempt made. A HIPPA compliant phone message was left for the patient providing contact information and requesting a return call. The care management team will reach out to the patient again over the next 1 days. If patient returns call to provider office, please advise to call [facility] at [phone].  [place], [place] [address] Direct Dial: [phone] [email or web address]

## 2020-02-11 NOTE — Chronic Care Management (AMB) (Signed)
  Chronic Care Management   Note  02/11/2020 Name: AERICA RINCON MRN: [id number] DOB: 28-Jun-1940  [name] is a 79 y.o. year old female who is a primary care patient of Sharion Balloon, FNP and is actively engaged with the care management team. I reached out to [name] by phone today to assist with re-scheduling an initial visit with the RN Case Manager.  Follow up plan: Unsuccessful telephone outreach attempt made. A HIPPA compliant phone message was left for the patient providing contact information and requesting a return call.  The care management team will reach out to the patient again over the next 7 days.  If patient returns call to provider office, please advise to call [facility] at [place], [place] [facility]  [place], [place] [address] Direct Dial: [email or web address]: [email or web address]

## 2020-02-14 NOTE — Telephone Encounter (Signed)
Spoke with Patient and care giver rescheduled for 03/31/2020

## 2020-02-14 NOTE — Chronic Care Management (AMB) (Signed)
  Chronic Care Management   Note  02/14/2020 Name: Virginia Young MRN: [id number] DOB: October 12, 1940  Virginia Young is a 79 y.o. year old female who is a primary care patient of Sharion Balloon, FNP and is actively engaged with the care management team. I reached out to Virginia Young by phone today to assist with re-scheduling an initial visit with the RN Case Manager.  Follow up plan: Telephone appointment with care management team member scheduled for:03/31/2020  [place], [place], [place] [address] Direct Dial: [email or web address] Website: [email or web address]

## 2020-02-15 ENCOUNTER — Other Ambulatory Visit: Payer: Self-pay | Admitting: Obstetrics and Gynecology

## 2020-02-15 DIAGNOSIS — N719 Inflammatory disease of uterus, unspecified: Secondary | ICD-10-CM

## 2020-02-16 ENCOUNTER — Ambulatory Visit (INDEPENDENT_AMBULATORY_CARE_PROVIDER_SITE_OTHER): Payer: Medicare Other

## 2020-02-16 ENCOUNTER — Other Ambulatory Visit (INDEPENDENT_AMBULATORY_CARE_PROVIDER_SITE_OTHER): Payer: Medicare Other | Admitting: *Deleted

## 2020-02-16 ENCOUNTER — Other Ambulatory Visit: Payer: Self-pay

## 2020-02-16 DIAGNOSIS — N719 Inflammatory disease of uterus, unspecified: Secondary | ICD-10-CM | POA: Diagnosis not present

## 2020-02-16 DIAGNOSIS — R8290 Unspecified abnormal findings in urine: Secondary | ICD-10-CM

## 2020-02-16 LAB — POCT URINALYSIS DIPSTICK OB
Glucose, UA: NEGATIVE
Ketones, UA: NEGATIVE
Nitrite, UA: POSITIVE
POC,PROTEIN,UA: NEGATIVE

## 2020-02-16 NOTE — Progress Notes (Signed)
   NURSE VISIT- UTI SYMPTOMS   SUBJECTIVE:  Virginia Young is a 79 y.o. No obstetric history on file. female here for UTI symptoms. She is a GYN patient. She reports urine has odor..  OBJECTIVE:  There were no vitals taken for this visit.  Appears well, in no apparent distress  Results for orders placed or performed in visit on 02/16/20 (from the past 24 hour(s))  POC Urinalysis Dipstick OB   Collection Time: 02/16/20  9:44 AM  Result Value Ref Range   Color, UA     Clarity, UA     Glucose, UA Negative Negative   Bilirubin, UA     Ketones, UA neg    Spec Grav, UA     Blood, UA large    pH, UA     POC,PROTEIN,UA Negative Negative, Trace, Small (1+), Moderate (2+), Large (3+), 4+   Urobilinogen, UA     Nitrite, UA positive    Leukocytes, UA Large (3+) (A) Negative   Appearance     Odor      ASSESSMENT: GYN patient with UTI symptoms and positive nitrites  PLAN: Note routed to Dr. Glo Herring   Rx sent by provider today: No Urine culture sent Call or return to clinic prn if these symptoms worsen or fail to improve as anticipated. Follow-up: as scheduled   Virginia Young  02/16/2020 9:45 AM

## 2020-02-16 NOTE — Progress Notes (Signed)
Persistent pyometra, with about 15 ml of fluid in the uterus. Pt will need an appt for dilation of the cervix, if she can. To eliminate drainage.

## 2020-02-16 NOTE — Progress Notes (Signed)
PELVIC US TA/TV:heterogeneous axial positioned uterus,anterior right calcified fibroid 1.6 x 1.4 x 1.9 cm,complex fluid filled endometrium  3 x 2.6 x 2.7 cm,EEC 2.4 mm (limited view),unable to visualize ovaries,pt was unable to empty bladder,no free fluid,no pain during ultrasound  Chaperone Peggy

## 2020-02-18 ENCOUNTER — Encounter: Payer: Self-pay | Admitting: Obstetrics and Gynecology

## 2020-02-18 ENCOUNTER — Telehealth: Payer: Self-pay | Admitting: Obstetrics and Gynecology

## 2020-02-18 DIAGNOSIS — N711 Chronic inflammatory disease of uterus: Secondary | ICD-10-CM | POA: Insufficient documentation

## 2020-02-18 MED ORDER — AMOXICILLIN-POT CLAVULANATE 875-125 MG PO TABS
1.0000 | ORAL_TABLET | Freq: Two times a day (BID) | ORAL | 0 refills | Status: DC
Start: 2020-02-18 — End: 2020-03-24

## 2020-02-18 NOTE — Telephone Encounter (Signed)
Pt still has pyometra symptoms of pelvic pressure and bladder discomfort.  Will add 2 weeks of Tx with Augmentin, and delay u/s til then.

## 2020-02-18 NOTE — Progress Notes (Signed)
GNR gram neg rods, no identifying of organism, treated with doxycycline x 7 days.

## 2020-02-19 LAB — URINE CULTURE

## 2020-02-21 ENCOUNTER — Ambulatory Visit: Payer: Medicare Other | Admitting: Obstetrics and Gynecology

## 2020-03-01 DIAGNOSIS — M25561 Pain in right knee: Secondary | ICD-10-CM | POA: Diagnosis not present

## 2020-03-01 DIAGNOSIS — M170 Bilateral primary osteoarthritis of knee: Secondary | ICD-10-CM | POA: Diagnosis not present

## 2020-03-01 DIAGNOSIS — M25562 Pain in left knee: Secondary | ICD-10-CM | POA: Diagnosis not present

## 2020-03-03 ENCOUNTER — Ambulatory Visit (INDEPENDENT_AMBULATORY_CARE_PROVIDER_SITE_OTHER): Payer: Medicare Other | Admitting: Obstetrics and Gynecology

## 2020-03-03 ENCOUNTER — Encounter: Payer: Self-pay | Admitting: Obstetrics and Gynecology

## 2020-03-03 ENCOUNTER — Other Ambulatory Visit: Payer: Self-pay | Admitting: Obstetrics and Gynecology

## 2020-03-03 VITALS — BP 119/75 | HR 91 | Wt 221.0 lb

## 2020-03-03 DIAGNOSIS — N711 Chronic inflammatory disease of uterus: Secondary | ICD-10-CM

## 2020-03-03 DIAGNOSIS — N719 Inflammatory disease of uterus, unspecified: Secondary | ICD-10-CM | POA: Diagnosis not present

## 2020-03-03 NOTE — Progress Notes (Signed)
[facility] Visit  @DATE @            Patient name: Virginia Young MRN [id number]  Date of birth: [birth year]  CC & HPI:  Virginia Young is a 79 y.o. female presenting today to discuss recent transvaginal/pelvic ultrasound performed on 02/16/2020, which showed a re-collection of fluid in the uterine cavity. She continues to have some  pyometra symptoms of pelvic pressure and bladder discomfort. She denies any drainage from fluid collection. Endometrial biopsy on 01/19/2020 showed marked chronic endometritis with tissue fragmentation and hemorrhage. There was no hyperplasia or malignancy.    ROS:  ROS + pelvic pressure + bladder discomfort  - drainage from fluid collection  Pertinent History Reviewed:   Reviewed Medical         Past Medical History:  Diagnosis Date  . Allergy   . Atrial fibrillation (HCC)    coumadin  . Cataract   . Chest pain 03/2012   a. Lex MV 10/13:  EF 64%, dist ant and apical defect sugg of soft tissue atten, no ischemia  . Chronic systolic heart failure ([place])   . Depression   . GERD (gastroesophageal reflux disease)   . HLD (hyperlipidemia)   . Mild mitral regurgitation   . MS (multiple sclerosis) ([place])   . NICM (nonischemic cardiomyopathy) ([place])    a. neg CLite in 2003;  b. EF 40-45% in past;   c.  Echo 12/11: EF 35-40%, mild MR, mild LAE, mild RAE, small pericardial effusion   . Osteoarthritis   . Osteoporosis   . Stasis ulcer ([place])   . Vaginal prolapse without uterine prolapse   . Varicose veins                               Surgical Hx:    Past Surgical History:  Procedure Laterality Date  . ANTERIOR AND POSTERIOR VAGINAL REPAIR W/ SACROSPINOUS LIGAMENT SUSPENSION  2016   [facility]  . BLADDER SURGERY    . CARPAL TUNNEL RELEASE Right 08/22/2017   Procedure: CARPAL TUNNEL RELEASE;  Surgeon: [name], MD;  Location: [facility];  Service: Orthopedics;  Laterality: Right;  . cataract surgery Bilateral   . EYE SURGERY    . INGUINAL  HERNIA REPAIR     right  . LIPOMA EXCISION     h/o removed from upper back x 2  . REVERSE SHOULDER ARTHROPLASTY Right 08/22/2017   Procedure: REVERSE SHOULDER ARTHROPLASTY;  Surgeon: [name], MD;  Location: [facility];  Service: Orthopedics;  Laterality: Right;   Medications: Reviewed & Updated - see associated section                       Current Outpatient Medications:  .  amoxicillin-clavulanate (AUGMENTIN) 875-125 MG tablet, Take 1 tablet by mouth 2 (two) times daily., Disp: 28 tablet, Rfl: 0 .  ciprofloxacin (CIPRO) 500 MG tablet, Take 1 tablet (500 mg total) by mouth every 12 (twelve) hours., Disp: 14 tablet, Rfl: 0 .  [facility] D3 25 MCG (1000 UT) capsule, TAKE 1 CAPSULE (1,000 UNITS TOTAL) BY MOUTH DAILY., Disp: 90 capsule, Rfl: 1 .  diclofenac Sodium (VOLTAREN) 1 % GEL, Apply 2 g topically daily as needed., Disp: , Rfl:  .  divalproex (DEPAKOTE ER) 250 MG 24 hr tablet, Take 1 tablet (250 mg total) by mouth daily., Disp: 90 tablet, Rfl: 3 .  doxycycline (VIBRAMYCIN) 100 MG capsule,  Take 1 capsule (100 mg total) by mouth 2 (two) times daily., Disp: 14 capsule, Rfl: 0 .  ELIQUIS 5 MG TABS tablet, TAKE 1 TABLET BY MOUTH EVERY 12 HOURS, Disp: 60 tablet, Rfl: 1 .  furosemide (LASIX) 20 MG tablet, Take 3 tablets (60 mg total) by mouth daily., Disp: 90 tablet, Rfl: 11 .  linaclotide (LINZESS) 72 MCG capsule, Take 1 capsule (72 mcg total) by mouth daily before breakfast., Disp: 90 capsule, Rfl: 1 .  losartan (COZAAR) 25 MG tablet, TAKE 1/2 TABLET BY MOUTH EVERY DAY, Disp: 45 tablet, Rfl: 3 .  memantine (NAMENDA) 5 MG tablet, Take 1 tablet (5 mg total) by mouth 2 (two) times daily., Disp: 180 tablet, Rfl: 3 .  metoprolol tartrate (LOPRESSOR) 25 MG tablet, Take 1 tablet (25 mg total) by mouth 2 (two) times daily., Disp: 180 tablet, Rfl: 3 .  Multiple Vitamin (MULTIVITAMIN) tablet, Take 1 tablet by mouth daily., Disp: , Rfl:  .  OXYGEN, Inhale into the lungs. 2 liters PRN - anytime when needed ( cardio  rx'd), Disp: , Rfl:  .  QUEtiapine (SEROQUEL) 50 MG tablet, TAKE 1 TABLET BY MOUTH TWICE A DAY, Disp: 180 tablet, Rfl: 3   Social History: Reviewed -  reports that she has never smoked. She has never used smokeless tobacco.  Objective Findings:  Vitals: Blood pressure 119/75, pulse 91, weight 221 lb (100.2 kg).  PHYSICAL EXAMINATION General appearance - alert, well appearing, and in no distress Mental status - alert, oriented to person, place, and time, normal mood, behavior, speech, dress, motor activity, and thought processes Abdomen - soft, nontender, nondistended, no masses or organomegaly Skin - normal coloration and turgor, no rashes, no suspicious skin lesions noted  PELVIC External genitalia - Normal appearance Vagina - Atrophic vaginal tissues Cervix - Atrophic. No lesions.   GYNECOLOGIC SONOGRAM   Virginia Young is a 79 y.o. . No LMP recorded. Patient is postmenopausal. She for a pelvic sonogram for pyometria.  Uterus                      7.2 x 4.1 x 5.8 cm, Total uterine volume 90 [id number] axial positioned uterus,anterior right calcified fibroid 1.6 x 1.4 x 1.9 cm  Endometrium          2.4 mm, symmetrical, complex fluid filled endometrium  3 x 2.6 x 2.7 cm  Right ovary             Right ovary not visualized  Left ovary                Left ovary not visualized  No free fluid   Technician Comments:  PELVIC US TA/TV:heterogeneous axial positioned uterus,anterior right calcified fibroid 1.6 x 1.4 x 1.9 cm,complex fluid filled endometrium  3 x 2.6 x 2.7 cm,EEC 2.4 mm (limited view),unable to visualize ovaries,pt was unable to empty bladder,no free fluid,no pain during ultrasound  [employer]one [employer] 02/16/2020 10:09 AM  Clinical Impression and recommendations:  I have reviewed the sonogram results above, combined with the patient's current clinical course, below are my impressions and any appropriate recommendations for  management based on the sonographic findings.  Uterus appears normal for a postmenopausal woman The fluid in the endometrium is consistent with the pre sonographic diagnosis of pyomtera The ovaries are not seen but there are not abnormal adnexal findings identified consistent with the post menopausal state   Florian Buff  Cervical dilation  perfomed to 27 mm french.and endometrial biopsy was performed. Some mucus or tissue from the endocervix was included in the bx sample. Betadine was used to swab the cervix. 12-15 ccs of dark, non-malodorous fluid was removed. Patient tolerated procedure well. No complications. Continued loss of urine through procedure.  Assessment & Plan:   A:  1. Pyometra now sterile.evacuated. 2. Cervix dilated to 27 french.  P:  1. Two weeks worth of Augmentin was prescribed on 02/18/2020 pt to complete regimen. Cervical dilation and endometrial biopsy was performed today.  By signing my name below, I, Clerance Lav, attest that this documentation has been prepared under the direction and in the presence of Jonnie Kind, MD. Electronically Signed: [place]. 03/03/20. 11:15 AM.  I personally performed the services described in this documentation, which was SCRIBED in my presence. The recorded information has been reviewed and considered accurate. It has been edited as necessary during review. Jonnie Kind, MD

## 2020-03-07 NOTE — Progress Notes (Signed)
Benign mucus from the cervix, no tissue that is abnormal on endometrial biopsy/ecc sample

## 2020-03-13 DIAGNOSIS — H40033 Anatomical narrow angle, bilateral: Secondary | ICD-10-CM | POA: Diagnosis not present

## 2020-03-13 DIAGNOSIS — H2513 Age-related nuclear cataract, bilateral: Secondary | ICD-10-CM | POA: Diagnosis not present

## 2020-03-24 ENCOUNTER — Telehealth: Payer: Self-pay | Admitting: Obstetrics & Gynecology

## 2020-03-24 MED ORDER — CEPHALEXIN 500 MG PO CAPS: 500.0000 mg | ORAL_CAPSULE | Freq: Three times a day (TID) | ORAL | 0 refills | Status: DC

## 2020-03-24 NOTE — Telephone Encounter (Addendum)
Pt is having an odor with urine and back pain. Symptoms started 1 week ago. Can you send in med? Thanks!! [place]

## 2020-03-24 NOTE — Telephone Encounter (Signed)
Rx for keflex e prescribed, her last urine culture was sensitive to keflex(02/18/20)

## 2020-03-24 NOTE — Telephone Encounter (Signed)
Patient caregiver called states she is having uti symptoms, and would like medication sent to pharmacy.

## 2020-03-31 ENCOUNTER — Ambulatory Visit (INDEPENDENT_AMBULATORY_CARE_PROVIDER_SITE_OTHER): Payer: Medicare Other | Admitting: *Deleted

## 2020-03-31 DIAGNOSIS — F0281 Dementia in other diseases classified elsewhere with behavioral disturbance: Secondary | ICD-10-CM

## 2020-03-31 DIAGNOSIS — G300 Alzheimer's disease with early onset: Secondary | ICD-10-CM

## 2020-03-31 DIAGNOSIS — I5022 Chronic systolic (congestive) heart failure: Secondary | ICD-10-CM | POA: Diagnosis not present

## 2020-03-31 DIAGNOSIS — F321 Major depressive disorder, single episode, moderate: Secondary | ICD-10-CM | POA: Diagnosis not present

## 2020-03-31 DIAGNOSIS — I10 Essential (primary) hypertension: Secondary | ICD-10-CM | POA: Diagnosis not present

## 2020-03-31 NOTE — Chronic Care Management (AMB) (Addendum)
Chronic Care Management   Initial Visit Note  03/31/2020 Name: Virginia Young MRN: [id number] DOB: 1940/08/05  Referred by: Sharion Balloon, FNP Reason for referral : Chronic Care Management (Initial Visit)   TYSHELL RAMBERG is a 79 y.o. year old female who is a primary care patient of Sharion Balloon, FNP. The CCM team was consulted for assistance with chronic disease management and care coordination needs related to CHF, Afib, HTN, MS, dementia, OA, HLD, depression.  Review of patient status, including review of consultants reports, relevant laboratory and other test results, and collaboration with appropriate care team members and the patient's provider was performed as part of comprehensive patient evaluation and provision of chronic care management services.     Subjective: I spoke with Ms Gainer and her in-home care Aide by telephone today regarding management of her chronic medical conditions.  SDOH (Social Determinants of Health) assessments performed: Yes See Care Plan activities for detailed interventions related to SDOH      Objective: Outpatient Encounter Medications as of 03/31/2020  Medication Sig   cephALEXin (KEFLEX) 500 MG capsule Take 1 capsule (500 mg total) by mouth 3 (three) times daily.   [facility]S D3 25 MCG (1000 UT) capsule TAKE 1 CAPSULE (1,000 UNITS TOTAL) BY MOUTH DAILY.   diclofenac Sodium (VOLTAREN) 1 % GEL Apply 2 g topically daily as needed.   divalproex (DEPAKOTE ER) 250 MG 24 hr tablet Take 1 tablet (250 mg total) by mouth daily.   ELIQUIS 5 MG TABS tablet TAKE 1 TABLET BY MOUTH EVERY 12 HOURS   furosemide (LASIX) 20 MG tablet Take 3 tablets (60 mg total) by mouth daily.   linaclotide (LINZESS) 72 MCG capsule Take 1 capsule (72 mcg total) by mouth daily before breakfast.   losartan (COZAAR) 25 MG tablet TAKE 1/2 TABLET BY MOUTH EVERY DAY   memantine (NAMENDA) 5 MG tablet Take 1 tablet (5 mg total) by mouth 2 (two) times daily.   metoprolol  tartrate (LOPRESSOR) 25 MG tablet Take 1 tablet (25 mg total) by mouth 2 (two) times daily.   Multiple Vitamin (MULTIVITAMIN) tablet Take 1 tablet by mouth daily.   OXYGEN Inhale into the lungs. 2 liters PRN - anytime when needed ( cardio rx'd) (Patient not taking: Reported on 03/03/2020)   QUEtiapine (SEROQUEL) 50 MG tablet TAKE 1 TABLET BY MOUTH TWICE A DAY   [DISCONTINUED] doxycycline (VIBRAMYCIN) 100 MG capsule Take 1 capsule (100 mg total) by mouth 2 (two) times daily.   No facility-administered encounter medications on file as of 03/31/2020.    BP Readings from Last 3 Encounters:  03/03/20 119/75  01/19/20 124/69  01/12/20 131/77   Lab Results  Component Value Date   CHOL 196 05/15/2015   HDL 73 05/15/2015   LDLCALC 108 (H) 05/15/2015   TRIG 74 05/15/2015   CHOLHDL 2.7 05/15/2015    Goals Addressed               This Visit's Progress     Patient Stated     Chronic Disease Management Goals (pt-stated)        CARE PLAN ENTRY (see longtitudinal plan of care for additional care plan information)  Current Barriers:  Chronic Disease Management support, education, and care coordination needs related to CHF, Afib, HTN, MS, dementia, OA, HLD, depression  Clinical Goal(s) related to CHF, Afib, HTN, MS, dementia, OA, HLD, depression:  Over the next 30 days, patient will:  Work with the care management team to address  educational, disease management, and care coordination needs  Begin or continue self health monitoring activities as directed today Measure and record blood pressure 5 times per week Call provider office for new or worsened signs and symptoms Blood pressure findings outside established parameters Call care management team with questions or concerns Verbalize basic understanding of patient centered plan of care established today  Interventions related to CHF, Afib, HTN, MS, dementia, OA, HLD, depression:  Evaluation of current treatment plans and patient's  adherence to plan as established by provider Assessed patient understanding of disease states Assessed patient's education and care coordination needs Provided disease specific education to patient  Collaborated with appropriate clinical care team members regarding patient needs Chart reviewed including recent office and telephone notes Reviewed medications Discussed mobility and ability to perform ADLs Discussed recent visit and telephone calls to gynecologist Prescribed keflex on 03/24/20 for Pyometra due to chronic inflammatory disease of uterus Discussed with patient and caregiver that she continues to have discomfort but that it has not worsened Encouraged them to reach out to Dr [employer] office (gyn) Reminded them that there is always someone on call for [facility] after hours and on the weekend if needed Provided with RN Care Manager telephone number and encouraged to reach out as needed  Patient Self Care Activities related to CHF, Afib, HTN, MS, dementia, OA, HLD, depression:  Patient is unable to independently self-manage chronic health conditions  Initial goal documentation          Plan:   Telephone follow up appointment with care management team member scheduled for: 04/05/2020 with RN Care Manager to complete initial intake screening The patient has been provided with contact information for the care management team and has been advised to call with any health related questions or concerns.   Chong Sicilian, BSN, RN-BC Embedded Chronic Care Manager [facility] [place] [facility] Direct Dial: [phone]    I have reviewed the CCM documentation and agree with the written assessment and plan of care.  Evelina Dun, FNP

## 2020-03-31 NOTE — Patient Instructions (Signed)
Visit Information  Goals Addressed              This Visit's Progress     Patient Stated   .  Chronic Disease Management Goals (pt-stated)        CARE PLAN ENTRY (see longtitudinal plan of care for additional care plan information)  Current Barriers:  . Chronic Disease Management support, education, and care coordination needs related to CHF, Afib, HTN, MS, dementia, OA, HLD, depression  Clinical Goal(s) related to CHF, Afib, HTN, MS, dementia, OA, HLD, depression:  Over the next 30 days, patient will:  . Work with the care management team to address educational, disease management, and care coordination needs  . Begin or continue self health monitoring activities as directed today Measure and record blood pressure 5 times per week . Call provider office for new or worsened signs and symptoms Blood pressure findings outside established parameters . Call care management team with questions or concerns . Verbalize basic understanding of patient centered plan of care established today  Interventions related to CHF, Afib, HTN, MS, dementia, OA, HLD, depression:  . Evaluation of current treatment plans and patient's adherence to plan as established by provider . Assessed patient understanding of disease states . Assessed patient's education and care coordination needs . Provided disease specific education to patient  . Collaborated with appropriate clinical care team members regarding patient needs . Chart reviewed including recent office and telephone notes . Reviewed medications . Discussed mobility and ability to perform ADLs . Discussed recent visit and telephone calls to gynecologist o Prescribed keflex on 03/24/20 for Pyometra due to chronic inflammatory disease of uterus . Discussed with patient and caregiver that she continues to have discomfort but that it has not worsened . Encouraged them to reach out to Dr Johnnye Sima office (gyn) . Reminded them that there is always  someone on call for [facility] after hours and on the weekend if needed . Provided with RN Care Manager telephone number and encouraged to reach out as needed  Patient Self Care Activities related to CHF, Afib, HTN, MS, dementia, OA, HLD, depression:  . Patient is unable to independently self-manage chronic health conditions  Initial goal documentation        Ms. Tuminello was given information about Chronic Care Management services today including:  1. CCM service includes personalized support from designated clinical staff supervised by her physician, including individualized plan of care and coordination with other care providers 2. 24/7 contact phone numbers for assistance for urgent and routine care needs. 3. Service will only be billed when office clinical staff spend 20 minutes or more in a month to coordinate care. 4. Only one practitioner may furnish and bill the service in a calendar month. 5. The patient may stop CCM services at any time (effective at the end of the month) by phone call to the office staff. 6. The patient will be responsible for cost sharing (co-pay) of up to 20% of the service fee (after annual deductible is met).  Patient agreed to services and verbal consent obtained.   Patient verbalizes understanding of instructions provided today.   Plan Telephone follow up appointment with care management team member scheduled for: 04/05/2020 with RN Care Manager to complete initial intake screening The patient has been provided with contact information for the care management team and has been advised to call with any health related questions or concerns.   Chong Sicilian, BSN, RN-BC Embedded Chronic Care Manager [place] /  [place] [facility] Direct Dial: [phone]

## 2020-04-04 ENCOUNTER — Telehealth: Payer: Self-pay | Admitting: *Deleted

## 2020-04-04 ENCOUNTER — Telehealth: Payer: Self-pay | Admitting: Obstetrics & Gynecology

## 2020-04-04 NOTE — Telephone Encounter (Signed)
[name] called for patient to say that before finishing her antibiotics given for pain/burning/back pain with urination the symptoms returned States the symptoms got a little better but have returned & patient finished antibiotics 03/31/2020  Please advise    [facility]

## 2020-04-04 NOTE — Telephone Encounter (Signed)
LMOVM returning patient's call.  

## 2020-04-05 ENCOUNTER — Telehealth: Payer: Self-pay | Admitting: *Deleted

## 2020-04-05 ENCOUNTER — Other Ambulatory Visit: Payer: Self-pay

## 2020-04-05 ENCOUNTER — Ambulatory Visit: Payer: Medicare Other | Admitting: *Deleted

## 2020-04-05 DIAGNOSIS — M545 Low back pain, unspecified: Secondary | ICD-10-CM | POA: Diagnosis not present

## 2020-04-05 DIAGNOSIS — F02818 Dementia in other diseases classified elsewhere, unspecified severity, with other behavioral disturbance: Secondary | ICD-10-CM

## 2020-04-05 DIAGNOSIS — F0281 Dementia in other diseases classified elsewhere with behavioral disturbance: Secondary | ICD-10-CM

## 2020-04-05 DIAGNOSIS — I10 Essential (primary) hypertension: Secondary | ICD-10-CM

## 2020-04-05 DIAGNOSIS — R300 Dysuria: Secondary | ICD-10-CM | POA: Diagnosis not present

## 2020-04-05 DIAGNOSIS — G300 Alzheimer's disease with early onset: Secondary | ICD-10-CM

## 2020-04-05 NOTE — Chronic Care Management (AMB) (Addendum)
  Chronic Care Management   Care Coordination Note  04/05/2020 Name: Virginia Young MRN: [id number] DOB: [birth year]  I spoke with Virginia Young and her caregiver, [name], last week regarding management of her chronic medical conditions. She was still having burning with urination even after taking an antibiotic and I asked them to f/u with gyn since they have been the treating provider. They talked with Dr Brynda [place]ath[place] office today and plan to leave a urine specimen to test.    Follow up plan: Telephone follow up appointment with care management team member scheduled for:04/10/20 with RN Care Manager  Chong Sicilian, BSN, RN-BC [place] [facility] [place] [facility] Direct Dial: [phone]    I have reviewed the CCM documentation and agree with the written assessment and plan of care.  Evelina Dun, FNP

## 2020-04-05 NOTE — Telephone Encounter (Signed)
Altha Harm, caregiver for [name], made aware that per Dr Elonda Husky, we need a urine specimen to send for culture.  Verbalized understanding and stated she would bring specimen to our office.

## 2020-04-05 NOTE — Telephone Encounter (Signed)
Virginia Young, caregiver for Virginia Young, states Olar took all of the Keflex prescribed but is still having burning with urination and back pain.  They are requesting a different antibiotic if possible.  Please advise.

## 2020-04-05 NOTE — Telephone Encounter (Signed)
We need a urine culture done, Not going to continue to treat empirically

## 2020-04-08 LAB — URINE CULTURE

## 2020-04-09 ENCOUNTER — Telehealth: Payer: Self-pay | Admitting: Obstetrics & Gynecology

## 2020-04-09 MED ORDER — CIPROFLOXACIN HCL 500 MG PO TABS: 500.0000 mg | ORAL_TABLET | Freq: Two times a day (BID) | ORAL | 0 refills | Status: DC

## 2020-04-10 ENCOUNTER — Ambulatory Visit: Payer: Medicare Other | Admitting: *Deleted

## 2020-04-10 ENCOUNTER — Other Ambulatory Visit: Payer: Self-pay | Admitting: *Deleted

## 2020-04-10 ENCOUNTER — Telehealth: Payer: Self-pay | Admitting: *Deleted

## 2020-04-10 DIAGNOSIS — F0281 Dementia in other diseases classified elsewhere with behavioral disturbance: Secondary | ICD-10-CM

## 2020-04-10 DIAGNOSIS — G300 Alzheimer's disease with early onset: Secondary | ICD-10-CM

## 2020-04-10 DIAGNOSIS — I10 Essential (primary) hypertension: Secondary | ICD-10-CM | POA: Diagnosis not present

## 2020-04-10 DIAGNOSIS — R399 Unspecified symptoms and signs involving the genitourinary system: Secondary | ICD-10-CM

## 2020-04-10 DIAGNOSIS — F321 Major depressive disorder, single episode, moderate: Secondary | ICD-10-CM | POA: Diagnosis not present

## 2020-04-10 DIAGNOSIS — F02818 Dementia in other diseases classified elsewhere, unspecified severity, with other behavioral disturbance: Secondary | ICD-10-CM

## 2020-04-10 DIAGNOSIS — I5022 Chronic systolic (congestive) heart failure: Secondary | ICD-10-CM | POA: Diagnosis not present

## 2020-04-10 NOTE — Patient Instructions (Signed)
Visit Information  Goals Addressed              This Visit's Progress     Patient Stated   .  "I need help with this bladder infection" (pt-stated)        CARE PLAN ENTRY (see longitudinal plan of care for additional care plan information)  Current Barriers:  . Care Coordination needs related to recurrent UTIs in a patient with pyometra, depression, and dementia . Cognitive Deficits  Nurse Case Manager Clinical Goal(s):  Marland Kitchen Over the next 7 days, patient will take antibiotics as prescribed . Over the next 7 days, patient will see resolution of UTI . Over the next 7 days, patient will f/u with gynecologist with any new, worsening, or persistent symptoms  Interventions:  . Inter-disciplinary care team collaboration (see longitudinal plan of care) . Chart reviewed including recent office and telehppone notes, lab results, and medication orders . Talked with patient and caregiver, [name], by telephone . Discussed results of culture and that [facility] . Discussed how to take antibiotics . Advised to f/u with Gyn with any new, worsening, or persistent symptoms . Encouraged to reach out to [facility] as needed  Patient Self Care Activities:  . Performs ADL's independently . Has assistance with IADLs  Initial goal documentation        Patient verbalizes understanding of instructions provided today.   Follow-up Plan:   The care management team will reach out to the patient again over the next 45 days.  Follow up with provider re: gynecologist regarding UTI  Chong Sicilian, BSN, RN-BC [place] / [place] Management Direct Dial: [phone]

## 2020-04-10 NOTE — Telephone Encounter (Signed)
LMOVM that new prescription has been sent to pharmacy. Urine + Ecoli

## 2020-04-10 NOTE — Chronic Care Management (AMB) (Addendum)
Chronic Care Management   Follow Up Note   04/10/2020 Name: Virginia Young MRN: [id number] DOB: Oct 25, 1940  Referred by: Sharion Balloon, FNP Reason for referral : Chronic Care Management (RN follow up)   Virginia Young is a 79 y.o. year old female who is a primary care patient of Sharion Balloon, FNP. The CCM team was consulted for assistance with chronic disease management and care coordination needs.    Review of patient status, including review of consultants reports, relevant laboratory and other test results, and collaboration with appropriate care team members and the patient's provider was performed as part of comprehensive patient evaluation and provision of chronic care management services.    SDOH (Social Determinants of Health) assessments performed: No See Care Plan activities for detailed interventions related to [facility])      Outpatient Encounter Medications as of 04/10/2020  Medication Sig   cephALEXin (KEFLEX) 500 MG capsule Take 1 capsule (500 mg total) by mouth 3 (three) times daily.   ciprofloxacin (CIPRO) 500 MG tablet Take 1 tablet (500 mg total) by mouth 2 (two) times daily.   [facility] D3 25 MCG (1000 UT) capsule TAKE 1 CAPSULE (1,000 UNITS TOTAL) BY MOUTH DAILY.   diclofenac Sodium (VOLTAREN) 1 % GEL Apply 2 g topically daily as needed.   divalproex (DEPAKOTE ER) 250 MG 24 hr tablet Take 1 tablet (250 mg total) by mouth daily.   ELIQUIS 5 MG TABS tablet TAKE 1 TABLET BY MOUTH EVERY 12 HOURS   furosemide (LASIX) 20 MG tablet Take 3 tablets (60 mg total) by mouth daily.   linaclotide (LINZESS) 72 MCG capsule Take 1 capsule (72 mcg total) by mouth daily before breakfast.   losartan (COZAAR) 25 MG tablet TAKE 1/2 TABLET BY MOUTH EVERY DAY   memantine (NAMENDA) 5 MG tablet Take 1 tablet (5 mg total) by mouth 2 (two) times daily.   metoprolol tartrate (LOPRESSOR) 25 MG tablet Take 1 tablet (25 mg total) by mouth 2 (two) times daily.   Multiple Vitamin  (MULTIVITAMIN) tablet Take 1 tablet by mouth daily.   OXYGEN Inhale into the lungs. 2 liters PRN - anytime when needed ( cardio rx'd) (Patient not taking: Reported on 03/03/2020)   QUEtiapine (SEROQUEL) 50 MG tablet TAKE 1 TABLET BY MOUTH TWICE A DAY   No facility-administered encounter medications on file as of 04/10/2020.     Goals Addressed               This Visit's Progress     Patient Stated     "I need help with this bladder infection" (pt-stated)        CARE PLAN ENTRY (see longitudinal plan of care for additional care plan information)  Current Barriers:  Care Coordination needs related to recurrent UTIs in a patient with pyometra, depression, and dementia Cognitive Deficits  Nurse Case Manager Clinical Goal(s):  Over the next 7 days, patient will take antibiotics as prescribed Over the next 7 days, patient will see resolution of UTI Over the next 7 days, patient will f/u with gynecologist with any new, worsening, or persistent symptoms  Interventions:  Inter-disciplinary care team collaboration (see longitudinal plan of care) Chart reviewed including recent office and telehppone notes, lab results, and medication orders Talked with patient and caregiver, [name], by telephone Discussed results of culture and that [facility] Discussed how to take antibiotics Advised to f/u with Gyn with any new, worsening, or persistent symptoms Encouraged to  reach out to [facility] as needed  Patient Self Care Activities:  Performs ADL's independently Has assistance with IADLs  Initial goal documentation          Plan:   The care management team will reach out to the patient again over the next 45 days.  Follow up with provider re: gynecologist regarding UTI  Chong Sicilian, BSN, RN-BC [place] [facility] [place] [facility] Direct Dial: [phone]   I have reviewed the CCM  documentation and agree with the written assessment and plan of care.  Evelina Dun, FNP

## 2020-04-10 NOTE — Chronic Care Management (AMB) (Addendum)
Chronic Care Management   Follow Up Note   04/10/2020 Name: Virginia Young MRN: [id number] DOB: [birth year]  Referred by: Sharion Balloon, FNP Reason for referral : Chronic Care Management (RN follow up)   Virginia Young is a 79 y.o. year old female who is a primary care patient of Sharion Balloon, FNP. The CCM team was consulted for assistance with chronic disease management and care coordination needs.    Review of patient status, including review of consultants reports, relevant laboratory and other test results, and collaboration with appropriate care team members and the patient's provider was performed as part of comprehensive patient evaluation and provision of chronic care management services.    SDOH (Social Determinants of Health) assessments performed: No See Care Plan activities for detailed interventions related to [facility])      Outpatient Encounter Medications as of 04/10/2020  Medication Sig   cephALEXin (KEFLEX) 500 MG capsule Take 1 capsule (500 mg total) by mouth 3 (three) times daily.   ciprofloxacin (CIPRO) 500 MG tablet Take 1 tablet (500 mg total) by mouth 2 (two) times daily.   [facility] D3 25 MCG (1000 UT) capsule TAKE 1 CAPSULE (1,000 UNITS TOTAL) BY MOUTH DAILY.   diclofenac Sodium (VOLTAREN) 1 % GEL Apply 2 g topically daily as needed.   divalproex (DEPAKOTE ER) 250 MG 24 hr tablet Take 1 tablet (250 mg total) by mouth daily.   ELIQUIS 5 MG TABS tablet TAKE 1 TABLET BY MOUTH EVERY 12 HOURS   furosemide (LASIX) 20 MG tablet Take 3 tablets (60 mg total) by mouth daily.   linaclotide (LINZESS) 72 MCG capsule Take 1 capsule (72 mcg total) by mouth daily before breakfast.   losartan (COZAAR) 25 MG tablet TAKE 1/2 TABLET BY MOUTH EVERY DAY   memantine (NAMENDA) 5 MG tablet Take 1 tablet (5 mg total) by mouth 2 (two) times daily.   metoprolol tartrate (LOPRESSOR) 25 MG tablet Take 1 tablet (25 mg total) by mouth 2 (two) times daily.   Multiple Vitamin  (MULTIVITAMIN) tablet Take 1 tablet by mouth daily.   OXYGEN Inhale into the lungs. 2 liters PRN - anytime when needed ( cardio rx'd) (Patient not taking: Reported on 03/03/2020)   QUEtiapine (SEROQUEL) 50 MG tablet TAKE 1 TABLET BY MOUTH TWICE A DAY   No facility-administered encounter medications on file as of 04/10/2020.     Objective:   Goals Addressed               This Visit's Progress     Patient Stated     "I need help with this bladder infection" (pt-stated)        CARE PLAN ENTRY (see longitudinal plan of care for additional care plan information)  Current Barriers:  Care Coordination needs related to recurrent UTIs in a patient with pyometra, depression, and dementia Cognitive Deficits  Nurse Case Manager Clinical Goal(s):  Over the next 7 days, patient will take antibiotics as prescribed Over the next 7 days, patient will see resolution of UTI Over the next 7 days, patient will f/u with gynecologist with any new, worsening, or persistent symptoms  Interventions:  Inter-disciplinary care team collaboration (see longitudinal plan of care) Chart reviewed including recent office and telehppone notes, lab results, and medication orders Talked with patient and caregiver, [name], by telephone Discussed results of culture and that [facility] Discussed how to take antibiotics Advised to f/u with Gyn with any new, worsening, or persistent  symptoms Encouraged to reach out to [facility] as needed  Patient Self Care Activities:  Performs ADL's independently Has assistance with IADLs  Please see past updates related to this goal by clicking on the "Past Updates" button in the selected goal           Plan:   The care management team will reach out to the patient again over the next 30 days.  Follow-up with gyn regarding new, worsening, or persistent symptoms  Chong Sicilian, BSN, RN-BC [place] [facility] [place] [facility] Direct Dial: [phone]   I have reviewed the CCM documentation and agree with the written assessment and plan of care.  Evelina Dun, FNP

## 2020-04-14 ENCOUNTER — Ambulatory Visit: Payer: Medicare Other | Admitting: Cardiology

## 2020-04-20 ENCOUNTER — Ambulatory Visit: Payer: Medicare Other | Admitting: Cardiology

## 2020-05-25 ENCOUNTER — Ambulatory Visit: Payer: Medicare Other | Admitting: Family Medicine

## 2020-06-05 ENCOUNTER — Other Ambulatory Visit: Payer: Self-pay | Admitting: Family

## 2020-06-05 DIAGNOSIS — I480 Paroxysmal atrial fibrillation: Secondary | ICD-10-CM

## 2020-06-27 NOTE — Progress Notes (Signed)
Cardiology Office Note   Date:  06/28/2020   ID:  Virginia Young, DOB 1940-12-01, MRN [id number]  PCP:  Junie Spencer, FNP  Cardiologist:   Rollene Rotunda, MD  Chief Complaint  Patient presents with  . Leg Swelling      History of Present Illness: Virginia Young is a 80 y.o. female who presents for follow up of atrial fibrillation and nonischemic cardiomyopathy.  She has a mildly reduced ejection fraction of 40% in 04/2016.  She has had orthostatic hypotension.  She had PFTs. This demonstrated a diffusion defect.   She has also been treated with Lasix.    Since I last saw her she has had increased lower extremity swelling.  She has chronic dyspnea.  She moves with a walker because of balance problems.  It sounds like she is relatively sedentary.  She does not really seem to watch her salt.  She is not describing new PND or orthopnea.  She is not having any new palpitations, presyncope or syncope.  She has had no weight gain or edema.    Past Medical History:  Diagnosis Date  . Allergy   . Atrial fibrillation (HCC)    coumadin  . Cataract   . Chest pain 03/2012   a. Lex MV 10/13:  EF 64%, dist ant and apical defect sugg of soft tissue atten, no ischemia  . Chronic systolic heart failure (HCC)   . Depression   . GERD (gastroesophageal reflux disease)   . HLD (hyperlipidemia)   . Mild mitral regurgitation   . MS (multiple sclerosis) (HCC)   . NICM (nonischemic cardiomyopathy) (HCC)    a. neg CLite in 2003;  b. EF 40-45% in past;   c.  Echo 12/11: EF 35-40%, mild MR, mild LAE, mild RAE, small pericardial effusion   . Osteoarthritis   . Osteoporosis   . Stasis ulcer (HCC)   . Vaginal prolapse without uterine prolapse   . Varicose veins     Past Surgical History:  Procedure Laterality Date  . ANTERIOR AND POSTERIOR VAGINAL REPAIR W/ SACROSPINOUS LIGAMENT SUSPENSION  2016   [facility]  . BLADDER SURGERY    . CARPAL TUNNEL RELEASE Right 08/22/2017   Procedure: CARPAL  TUNNEL RELEASE;  Surgeon: Beverely Low, MD;  Location: [facility];  Service: Orthopedics;  Laterality: Right;  . cataract surgery Bilateral   . EYE SURGERY    . INGUINAL HERNIA REPAIR     right  . LIPOMA EXCISION     h/o removed from upper back x 2  . REVERSE SHOULDER ARTHROPLASTY Right 08/22/2017   Procedure: REVERSE SHOULDER ARTHROPLASTY;  Surgeon: Beverely Low, MD;  Location: [facility] OR;  Service: Orthopedics;  Laterality: Right;     Current Outpatient Medications  Medication Sig Dispense Refill  . [facility] D3 25 MCG (1000 UT) capsule TAKE 1 CAPSULE (1,000 UNITS TOTAL) BY MOUTH DAILY. 90 capsule 1  . diclofenac Sodium (VOLTAREN) 1 % GEL Apply 2 g topically daily as needed.    . divalproex (DEPAKOTE ER) 250 MG 24 hr tablet Take 1 tablet (250 mg total) by mouth daily. 90 tablet 3  . ELIQUIS 5 MG TABS tablet TAKE 1 TABLET BY MOUTH EVERY 12 HOURS 60 tablet 0  . furosemide (LASIX) 20 MG tablet Take 3 tablets (60 mg total) by mouth daily. 90 tablet 11  . linaclotide (LINZESS) 72 MCG capsule Take 1 capsule (72 mcg total) by mouth daily before breakfast. 90 capsule 1  . losartan (  COZAAR) 25 MG tablet TAKE 1/2 TABLET BY MOUTH EVERY DAY 45 tablet 3  . memantine (NAMENDA) 5 MG tablet Take 1 tablet (5 mg total) by mouth 2 (two) times daily. 180 tablet 3  . metoprolol tartrate (LOPRESSOR) 25 MG tablet Take 1 tablet (25 mg total) by mouth 2 (two) times daily. 180 tablet 3  . Multiple Vitamin (MULTIVITAMIN) tablet Take 1 tablet by mouth daily.    . QUEtiapine (SEROQUEL) 50 MG tablet TAKE 1 TABLET BY MOUTH TWICE A DAY 180 tablet 3  . OXYGEN Inhale into the lungs. 2 liters PRN - anytime when needed ( cardio rx'd) (Patient not taking: Reported on 03/03/2020)     No current facility-administered medications for this visit.    Allergies:   Bactrim [sulfamethoxazole-trimethoprim], Lipitor [atorvastatin calcium], and Lisinopril    ROS:  Please see the history of present illness.   Otherwise, review of systems are  positive for none.   All other systems are reviewed and negative.    PHYSICAL EXAM: VS:  BP 132/77   Pulse 76   Ht 5\' 10"  (1.778 m)   Wt 221 lb (100.2 kg)   BMI 31.71 kg/m  , BMI Body mass index is 31.71 kg/m. GENERAL:  Well appearing NECK:  No jugular venous distention, waveform within normal limits, carotid upstroke brisk and symmetric, no bruits, no thyromegaly LUNGS:  Clear to auscultation bilaterally CHEST:  Unremarkable HEART:  PMI not displaced or sustained,S1 and S2 within normal limits, no S3,  no clicks, no rubs, no murmurs ABD:  Flat, positive bowel sounds normal in frequency in pitch, no bruits, no rebound, no guarding, no midline pulsatile mass, no hepatomegaly, no splenomegaly EXT:  2 plus pulses throughout, moderate left greater than right leg edema, no cyanosis no clubbing   EKG:  EKG is  ordered today. Atrial fibrillation, rate 76, axis within normal limits, intervals within normal limits, low voltage in the limb in chest leads, poor anterior R wave progression  Recent Labs: 01/12/2020: ALT 23; BUN 16; Creatinine, Ser 1.00; Hemoglobin 15.2; Platelets 250; Potassium 4.1; Sodium 136    Lipid Panel    Component Value Date/Time   CHOL 196 05/15/2015 0827   TRIG 74 05/15/2015 0827   HDL 73 05/15/2015 0827   CHOLHDL 2.7 05/15/2015 0827   CHOLHDL 3 03/22/2013 1126   VLDL 17.8 03/22/2013 1126   LDLCALC 108 (H) 05/15/2015 0827      Wt Readings from Last 3 Encounters:  06/28/20 221 lb (100.2 kg)  03/03/20 221 lb (100.2 kg)  01/19/20 (!) 214 lb (97.1 kg)     Other studies Reviewed: Additional studies/ records that were reviewed today include: Labs Review of the above records demonstrates:  Please see elsewhere in the note.     ASSESSMENT AND PLAN:  NICM:     She has an increased volume today.  I am going to give her an extra 3 days of 20 mg Lasix.  She has not tolerated med titration in the past.  I think she would do just as well with salt restriction and we  talked at length about this.  We talked about keeping her feet elevated.  I will check a basic metabolic profile in 2 weeks.    Atrial fib:   Ms. [name] has a CHA2DS2 - VASc score of 3.  She does not really notice this rhythm.  I will check a CBC as its been since July.   Current medicines are reviewed at  length with the patient today.  The patient does not have concerns regarding medicines.  The following changes have been made: As above  Labs/ tests ordered today include:    Orders Placed This Encounter  Procedures  . CBC  . Basic metabolic panel  . EKG 12-Lead     Disposition:   FU with 4 months.   Signed, Minus Breeding, MD  06/28/2020 1:00 PM    [place]

## 2020-06-28 ENCOUNTER — Other Ambulatory Visit: Payer: Self-pay

## 2020-06-28 ENCOUNTER — Encounter: Payer: Self-pay | Admitting: Cardiology

## 2020-06-28 ENCOUNTER — Ambulatory Visit (INDEPENDENT_AMBULATORY_CARE_PROVIDER_SITE_OTHER): Payer: Medicare Other | Admitting: Cardiology

## 2020-06-28 VITALS — BP 132/77 | HR 76 | Ht 70.0 in | Wt 221.0 lb

## 2020-06-28 DIAGNOSIS — I428 Other cardiomyopathies: Secondary | ICD-10-CM | POA: Diagnosis not present

## 2020-06-28 DIAGNOSIS — Z79899 Other long term (current) drug therapy: Secondary | ICD-10-CM

## 2020-06-28 DIAGNOSIS — I482 Chronic atrial fibrillation, unspecified: Secondary | ICD-10-CM | POA: Diagnosis not present

## 2020-06-28 NOTE — Patient Instructions (Addendum)
Medication Instructions:  Take Furosemide 80 mg daily for the next 3 days then resume normal dose. Continue all other medications as listed.  *If you need a refill on your cardiac medications before your next appointment, please call your pharmacy*  Lab Work: Please have blood work in 2 weeks at [facility] (BMP, CBC)  If you have labs (blood work) drawn today and your tests are completely normal, you will receive your results only by: Marland Kitchen MyChart Message (if you have MyChart) OR . A paper copy in the mail If you have any lab test that is abnormal or we need to change your treatment, we will call you to review the results.  Follow-Up: At [facility], you and your health needs are our priority.  As part of our continuing mission to provide you with exceptional heart care, we have created designated Provider Care Teams.  These Care Teams include your primary Cardiologist (physician) and Advanced Practice Providers (APPs -  Physician Assistants and Nurse Practitioners) who all work together to provide you with the care you need, when you need it.  We recommend signing up for the patient portal called "MyChart".  Sign up information is provided on this After Visit Summary.  MyChart is used to connect with patients for Virtual Visits (Telemedicine).  Patients are able to view lab/test results, encounter notes, upcoming appointments, etc.  Non-urgent messages can be sent to your provider as well.   To learn more about what you can do with MyChart, go to [email or web address].    Your next appointment:   4 month(s)  The format for your next appointment:   In Person  Provider:   Rollene Rotunda, MD   Thank you for choosing [facility]!!

## 2020-07-13 ENCOUNTER — Other Ambulatory Visit: Payer: Medicare Other

## 2020-07-13 DIAGNOSIS — I428 Other cardiomyopathies: Secondary | ICD-10-CM | POA: Diagnosis not present

## 2020-07-13 DIAGNOSIS — I482 Chronic atrial fibrillation, unspecified: Secondary | ICD-10-CM | POA: Diagnosis not present

## 2020-07-13 DIAGNOSIS — Z79899 Other long term (current) drug therapy: Secondary | ICD-10-CM | POA: Diagnosis not present

## 2020-07-13 LAB — CBC
Hematocrit: 42.6 % (ref 34.0–46.6)
Hemoglobin: 14.3 g/dL (ref 11.1–15.9)
MCH: 29 pg (ref 26.6–33.0)
MCHC: 33.6 g/dL (ref 31.5–35.7)
MCV: 86 fL (ref 79–97)
Platelets: 244 10*3/uL (ref 150–450)
RBC: 4.93 x10E6/uL (ref 3.77–5.28)
RDW: 12.5 % (ref 11.7–15.4)
WBC: 11 10*3/uL — ABNORMAL HIGH (ref 3.4–10.8)

## 2020-07-14 LAB — BASIC METABOLIC PANEL
BUN/Creatinine Ratio: 14 (ref 12–28)
BUN: 14 mg/dL (ref 8–27)
CO2: 28 mmol/L (ref 20–29)
Calcium: 9.1 mg/dL (ref 8.7–10.3)
Chloride: 95 mmol/L — ABNORMAL LOW (ref 96–106)
Creatinine, Ser: 0.99 mg/dL (ref 0.57–1.00)
GFR calc Af Amer: 62 mL/min/{1.73_m2} (ref >59)
GFR calc non Af Amer: 54 mL/min/{1.73_m2} — ABNORMAL LOW (ref >59)
Glucose: 98 mg/dL (ref 65–99)
Potassium: 4.2 mmol/L (ref 3.5–5.2)
Sodium: 138 mmol/L (ref 134–144)

## 2020-07-21 ENCOUNTER — Other Ambulatory Visit: Payer: Self-pay | Admitting: Family

## 2020-07-21 DIAGNOSIS — I480 Paroxysmal atrial fibrillation: Secondary | ICD-10-CM

## 2020-07-27 ENCOUNTER — Telehealth: Payer: Self-pay | Admitting: Family Medicine

## 2020-07-27 MED ORDER — QUETIAPINE FUMARATE 50 MG PO TABS: 50.0000 mg | ORAL_TABLET | Freq: Two times a day (BID) | ORAL | 1 refills | Status: DC

## 2020-07-27 NOTE — Telephone Encounter (Signed)
Spoke to daughter and refill for 6 months as pts appt is not until 10-2020.  Taking 50mg  po bid seroquel- confirmed. Pt is doing ok.  She appreciated call back.

## 2020-07-27 NOTE — Telephone Encounter (Signed)
Pt.'s daughter [name] is on [place]. She states that QUEtiapine (SEROQUEL) 50 MG tablet has been denied per pharmacy. She is asking for a refill until mom's next appt. She also asks for a call to let her know if this will be filled or not. Please advise.

## 2020-07-27 NOTE — Addendum Note (Signed)
Addended by: Brandon Melnick on: 07/27/2020 11:05 AM   Modules accepted: Orders

## 2020-08-08 DIAGNOSIS — M170 Bilateral primary osteoarthritis of knee: Secondary | ICD-10-CM | POA: Diagnosis not present

## 2020-08-10 ENCOUNTER — Ambulatory Visit: Payer: Medicare Other | Admitting: Family Medicine

## 2020-08-11 ENCOUNTER — Ambulatory Visit (INDEPENDENT_AMBULATORY_CARE_PROVIDER_SITE_OTHER): Payer: Medicare Other | Admitting: Family

## 2020-08-11 ENCOUNTER — Encounter: Payer: Self-pay | Admitting: Family

## 2020-08-11 ENCOUNTER — Other Ambulatory Visit: Payer: Self-pay

## 2020-08-11 VITALS — BP 127/82 | HR 56 | Temp 97.1°F | Ht 70.0 in | Wt 223.6 lb

## 2020-08-11 DIAGNOSIS — R399 Unspecified symptoms and signs involving the genitourinary system: Secondary | ICD-10-CM

## 2020-08-11 DIAGNOSIS — Z7901 Long term (current) use of anticoagulants: Secondary | ICD-10-CM | POA: Diagnosis not present

## 2020-08-11 DIAGNOSIS — I080 Rheumatic disorders of both mitral and aortic valves: Secondary | ICD-10-CM | POA: Diagnosis not present

## 2020-08-11 DIAGNOSIS — E559 Vitamin D deficiency, unspecified: Secondary | ICD-10-CM

## 2020-08-11 DIAGNOSIS — I5022 Chronic systolic (congestive) heart failure: Secondary | ICD-10-CM

## 2020-08-11 DIAGNOSIS — E782 Mixed hyperlipidemia: Secondary | ICD-10-CM | POA: Diagnosis not present

## 2020-08-11 DIAGNOSIS — K219 Gastro-esophageal reflux disease without esophagitis: Secondary | ICD-10-CM

## 2020-08-11 DIAGNOSIS — B372 Candidiasis of skin and nail: Secondary | ICD-10-CM | POA: Diagnosis not present

## 2020-08-11 DIAGNOSIS — F0281 Dementia in other diseases classified elsewhere with behavioral disturbance: Secondary | ICD-10-CM

## 2020-08-11 DIAGNOSIS — I10 Essential (primary) hypertension: Secondary | ICD-10-CM | POA: Diagnosis not present

## 2020-08-11 DIAGNOSIS — G300 Alzheimer's disease with early onset: Secondary | ICD-10-CM | POA: Diagnosis not present

## 2020-08-11 DIAGNOSIS — I4821 Permanent atrial fibrillation: Secondary | ICD-10-CM | POA: Diagnosis not present

## 2020-08-11 DIAGNOSIS — M159 Polyosteoarthritis, unspecified: Secondary | ICD-10-CM

## 2020-08-11 DIAGNOSIS — M8949 Other hypertrophic osteoarthropathy, multiple sites: Secondary | ICD-10-CM | POA: Diagnosis not present

## 2020-08-11 DIAGNOSIS — F321 Major depressive disorder, single episode, moderate: Secondary | ICD-10-CM | POA: Diagnosis not present

## 2020-08-11 DIAGNOSIS — Z23 Encounter for immunization: Secondary | ICD-10-CM

## 2020-08-11 DIAGNOSIS — K5900 Constipation, unspecified: Secondary | ICD-10-CM

## 2020-08-11 DIAGNOSIS — N3000 Acute cystitis without hematuria: Secondary | ICD-10-CM

## 2020-08-11 LAB — MICROSCOPIC EXAMINATION: Epithelial Cells (non renal): NONE SEEN /hpf (ref 0–10)

## 2020-08-11 LAB — URINALYSIS, COMPLETE
Bilirubin, UA: NEGATIVE
Glucose, UA: NEGATIVE
Ketones, UA: NEGATIVE
Nitrite, UA: NEGATIVE
Protein,UA: NEGATIVE
Specific Gravity, UA: 1.015 (ref 1.005–1.030)
Urobilinogen, Ur: 0.2 mg/dL (ref 0.2–1.0)
pH, UA: 8.5 — ABNORMAL HIGH (ref 5.0–7.5)

## 2020-08-11 MED ORDER — NYSTATIN 100000 UNIT/GM EX POWD: 1.0000 "application " | Freq: Three times a day (TID) | CUTANEOUS | 0 refills | Status: DC

## 2020-08-11 MED ORDER — CEPHALEXIN 500 MG PO CAPS: 500.0000 mg | ORAL_CAPSULE | Freq: Two times a day (BID) | ORAL | 0 refills | Status: DC

## 2020-08-11 MED ORDER — KETOCONAZOLE 2 % EX CREA: 1.0000 "application " | TOPICAL_CREAM | Freq: Every day | CUTANEOUS | 0 refills | Status: DC

## 2020-08-11 NOTE — Patient Instructions (Signed)

## 2020-08-11 NOTE — Progress Notes (Signed)
Subjective:    Patient ID: Virginia Young, female    DOB: 04/10/1941, 80 y.o.   MRN: [id number]  Chief Complaint  Patient presents with  . Pain Management  . Back Pain  . Dysuria    Pt presents to the office today for chronic follow up. She is followed by Cardiologists annually for A Fib, CHF, and HTN. She takes her Eliquis BID.  She is followed by Neurologists annually for dementia. She is followed by Ortho annually for osteoarthritis in her shoulders and knees.   Back Pain Associated symptoms include dysuria.  Dysuria  This is a new problem. The current episode started in the past 7 days. The problem occurs intermittently. The problem has been waxing and waning. The quality of the pain is described as burning. The pain is mild. There has been no fever. Associated symptoms include flank pain, frequency and urgency. Pertinent negatives include no hematuria or nausea.  Hypertension This is a chronic problem. The current episode started more than 1 year ago. The problem has been resolved since onset. The problem is controlled. Associated symptoms include peripheral edema. Pertinent negatives include no shortness of breath. Risk factors for coronary artery disease include dyslipidemia, obesity and sedentary lifestyle. The current treatment provides moderate improvement.  Congestive Heart Failure Presents for follow-up visit. Associated symptoms include edema and fatigue. Pertinent negatives include no shortness of breath. The symptoms have been stable.  Gastroesophageal Reflux She complains of belching and heartburn. She reports no nausea. This is a chronic problem. The current episode started more than 1 year ago. The problem occurs rarely. Associated symptoms include fatigue.  Hyperlipidemia This is a chronic problem. The current episode started more than 1 year ago. Pertinent negatives include no shortness of breath. Current antihyperlipidemic treatment includes diet change. The current  treatment provides mild improvement of lipids. Risk factors for coronary artery disease include dyslipidemia, hypertension, a sedentary lifestyle and post-menopausal.  Depression        This is a chronic problem.  The current episode started more than 1 year ago.   The problem occurs intermittently.  Associated symptoms include fatigue, restlessness and sad. Constipation This is a chronic problem. The current episode started more than 1 year ago. The problem has been resolved since onset. Her stool frequency is 1 time per day. Associated symptoms include back pain. Pertinent negatives include no nausea. She has tried laxatives for the symptoms. The treatment provided moderate relief.      Review of Systems  Constitutional: Positive for fatigue.  Respiratory: Negative for shortness of breath.   Gastrointestinal: Positive for constipation and heartburn. Negative for nausea.  Genitourinary: Positive for dysuria, flank pain, frequency and urgency. Negative for hematuria.  Musculoskeletal: Positive for back pain.  Psychiatric/Behavioral: Positive for depression.  All other systems reviewed and are negative.      Objective:   Physical Exam Vitals reviewed.  Constitutional:      General: She is not in acute distress.    Appearance: She is well-developed and well-nourished.  HENT:     Head: Normocephalic and atraumatic.     Right Ear: Tympanic membrane normal.     Left Ear: Tympanic membrane normal.     Mouth/Throat:     Mouth: Oropharynx is clear and moist.  Eyes:     Pupils: Pupils are equal, round, and reactive to light.  Neck:     Thyroid: No thyromegaly.  Cardiovascular:     Rate and Rhythm: Normal rate and regular rhythm.  Pulses: Intact distal pulses.     Heart sounds: Normal heart sounds. No murmur heard.   Pulmonary:     Effort: Pulmonary effort is normal. No respiratory distress.     Breath sounds: Normal breath sounds. No wheezing.  Abdominal:     General: Bowel  sounds are normal. There is no distension.     Palpations: Abdomen is soft.     Tenderness: There is no abdominal tenderness.  Musculoskeletal:        General: No tenderness or edema. Normal range of motion.     Cervical back: Normal range of motion and neck supple.  Skin:    General: Skin is warm and dry.     Findings: Erythema and rash present.     Comments: Yeast rash under bilateral breast and groin  Neurological:     Mental Status: She is alert and oriented to person, place, and time.     Cranial Nerves: No cranial nerve deficit.     Motor: Weakness present.     Gait: Gait abnormal.     Deep Tendon Reflexes: Reflexes are normal and symmetric.     Comments: Using cane  Psychiatric:        Mood and Affect: Mood and affect normal.        Behavior: Behavior normal.        Thought Content: Thought content normal.        Judgment: Judgment normal.       BP 127/82   Pulse (!) 56   Temp (!) 97.1 F (36.2 C) (Temporal)   Ht 5\' 10"  (1.778 m)   Wt 223 lb 9.6 oz (101.4 kg)   BMI 32.08 kg/m      Assessment & Plan:  Virginia Young comes in today with chief complaint of Pain Management, Back Pain, and Dysuria   Diagnosis and orders addressed:  1. UTI symptoms - Urinalysis, Complete - Urine Culture  2. Permanent atrial fibrillation ([place])  3. Chronic systolic HF (heart failure) ([place])  4. Primary hypertension  5. MITRAL REGURGITATION, MILD  6. Gastroesophageal reflux disease, unspecified whether esophagitis present  7. Early onset Alzheimer's dementia with behavioral disturbance ([place])  8. Primary osteoarthritis involving multiple joints  9. Mixed hyperlipidemia  10. Depression, major, single episode, moderate ([place])  11. Long term current use of anticoagulant  12. Candidal skin infection  13. Constipation, unspecified constipation type - ketoconazole (NIZORAL) 2 % cream; Apply 1 application topically daily.  Dispense: 15 g; Refill: 0 - cephALEXin (KEFLEX)  500 MG capsule; Take 1 capsule (500 mg total) by mouth 2 (two) times daily.  Dispense: 14 capsule; Refill: 0  14. Vitamin D deficiency  15. Acute cystitis without hematuria Force fluids AZO over the counter X2 days RTO if symptoms worsen or do not improve  Culture pending - nystatin (MYCOSTATIN/NYSTOP) powder; Apply 1 application topically 3 (three) times daily.  Dispense: 30 g; Refill: 0   Labs reviewed from Cardiologists  Health Maintenance reviewed Diet and exercise encouraged  Follow up plan: 4 months    Evelina Dun, FNP

## 2020-08-14 ENCOUNTER — Encounter: Payer: Self-pay | Admitting: Family Medicine

## 2020-08-14 ENCOUNTER — Ambulatory Visit (INDEPENDENT_AMBULATORY_CARE_PROVIDER_SITE_OTHER): Payer: Medicare Other | Admitting: Family Medicine

## 2020-08-14 DIAGNOSIS — M542 Cervicalgia: Secondary | ICD-10-CM | POA: Diagnosis not present

## 2020-08-14 MED ORDER — ONDANSETRON HCL 4 MG PO TABS: 4.0000 mg | ORAL_TABLET | Freq: Three times a day (TID) | ORAL | 1 refills | Status: DC | PRN

## 2020-08-14 NOTE — Progress Notes (Signed)
Virtual Visit via telephone Note  I connected with Virginia Young on 08/14/20 at 1252 by telephone and verified that I am speaking with the correct person using two identifiers. Virginia Young is currently located at home and caregiver are currently with her during visit. The provider, Fransisca Kaufmann Tsutomu Barfoot, MD is located in their office at time of visit.  Call ended at 1301  I discussed the limitations, risks, security and privacy concerns of performing an evaluation and management service by telephone and the availability of in person appointments. I also discussed with the patient that there may be a patient responsible charge related to this service. The patient expressed understanding and agreed to proceed.   History and Present Illness: Patient has been achy and not feeling good and is nauseated and is having pain in the back of neck.  It is warm to touch.  She is all of a sudden having significant pain in her neck.  She said it started hurting real bad last night.  She said she did not fall.  She denies any fevers or chills.  She is under treatment for antibiotic. She has been drinking ginger ale.  She has saltine crackers. She is keeping fluids down. She has nausea. She has been using tylenol and motrin. Today it is neck bad. They went to ortho for knee cortisone shots.   1. Neck pain     Outpatient Encounter Medications as of 08/14/2020  Medication Sig  . ondansetron (ZOFRAN) 4 MG tablet Take 1 tablet (4 mg total) by mouth every 8 (eight) hours as needed for nausea or vomiting.  . cephALEXin (KEFLEX) 500 MG capsule Take 1 capsule (500 mg total) by mouth 2 (two) times daily.  . [facility] D3 25 MCG (1000 UT) capsule TAKE 1 CAPSULE (1,000 UNITS TOTAL) BY MOUTH DAILY.  Marland Kitchen diclofenac Sodium (VOLTAREN) 1 % GEL Apply 2 g topically daily as needed.  . divalproex (DEPAKOTE ER) 250 MG 24 hr tablet Take 1 tablet (250 mg total) by mouth daily.  Marland Kitchen ELIQUIS 5 MG TABS tablet Take 1 tablet (5 mg  total) by mouth every 12 (twelve) hours. (Needs to be seen before next refill)  . furosemide (LASIX) 20 MG tablet Take 3 tablets (60 mg total) by mouth daily.  Marland Kitchen ketoconazole (NIZORAL) 2 % cream Apply 1 application topically daily.  Marland Kitchen linaclotide (LINZESS) 72 MCG capsule Take 1 capsule (72 mcg total) by mouth daily before breakfast.  . losartan (COZAAR) 25 MG tablet TAKE 1/2 TABLET BY MOUTH EVERY DAY  . memantine (NAMENDA) 5 MG tablet Take 1 tablet (5 mg total) by mouth 2 (two) times daily.  . metoprolol tartrate (LOPRESSOR) 25 MG tablet Take 1 tablet (25 mg total) by mouth 2 (two) times daily.  . Multiple Vitamin (MULTIVITAMIN) tablet Take 1 tablet by mouth daily.  Marland Kitchen nystatin (MYCOSTATIN/NYSTOP) powder Apply 1 application topically 3 (three) times daily.  . OXYGEN Inhale into the lungs. 2 liters PRN - anytime when needed ( cardio rx'd)  . QUEtiapine (SEROQUEL) 50 MG tablet Take 1 tablet (50 mg total) by mouth 2 (two) times daily.   No facility-administered encounter medications on file as of 08/14/2020.    Review of Systems  Constitutional: Negative for chills and fever.  Eyes: Negative for visual disturbance.  Respiratory: Negative for chest tightness and shortness of breath.   Cardiovascular: Negative for chest pain and leg swelling.  Musculoskeletal: Positive for back pain and neck pain. Negative for gait problem.  Skin: Negative for rash.  Neurological: Negative for light-headedness and headaches.  Psychiatric/Behavioral: Negative for agitation and behavioral problems.  All other systems reviewed and are negative.   Observations/Objective: Patient is confused but is normally  Assessment and Plan: Problem List Items Addressed This Visit   None   Visit Diagnoses    Neck pain    -  Primary   Relevant Orders   DG Cervical Spine Complete       Follow up plan: Return if symptoms worsen or fail to improve.     I discussed the assessment and treatment plan with the patient.  The patient was provided an opportunity to ask questions and all were answered. The patient agreed with the plan and demonstrated an understanding of the instructions.   The patient was advised to call back or seek an in-person evaluation if the symptoms worsen or if the condition fails to improve as anticipated.  The above assessment and management plan was discussed with the patient. The patient verbalized understanding of and has agreed to the management plan. Patient is aware to call the clinic if symptoms persist or worsen. Patient is aware when to return to the clinic for a follow-up visit. Patient educated on when it is appropriate to go to the emergency department.    I provided 9 minutes of non-face-to-face time during this encounter.    Worthy Rancher, MD

## 2020-08-15 ENCOUNTER — Ambulatory Visit (INDEPENDENT_AMBULATORY_CARE_PROVIDER_SITE_OTHER): Payer: Medicare Other | Admitting: Family

## 2020-08-15 ENCOUNTER — Telehealth: Payer: Self-pay

## 2020-08-15 ENCOUNTER — Encounter: Payer: Self-pay | Admitting: Family

## 2020-08-15 ENCOUNTER — Ambulatory Visit (INDEPENDENT_AMBULATORY_CARE_PROVIDER_SITE_OTHER): Payer: Medicare Other

## 2020-08-15 ENCOUNTER — Other Ambulatory Visit: Payer: Self-pay

## 2020-08-15 VITALS — BP 166/111 | HR 104 | Temp 98.3°F | Ht 70.0 in

## 2020-08-15 DIAGNOSIS — M47812 Spondylosis without myelopathy or radiculopathy, cervical region: Secondary | ICD-10-CM

## 2020-08-15 DIAGNOSIS — M542 Cervicalgia: Secondary | ICD-10-CM | POA: Diagnosis not present

## 2020-08-15 DIAGNOSIS — M4802 Spinal stenosis, cervical region: Secondary | ICD-10-CM | POA: Diagnosis not present

## 2020-08-15 LAB — URINE CULTURE

## 2020-08-15 MED ORDER — TRAMADOL HCL 50 MG PO TABS: 50.0000 mg | ORAL_TABLET | Freq: Three times a day (TID) | ORAL | 0 refills | Status: DC | PRN

## 2020-08-15 MED ORDER — PREDNISONE 10 MG (21) PO TBPK: ORAL_TABLET | ORAL | 0 refills | Status: DC

## 2020-08-15 MED ORDER — METHYLPREDNISOLONE ACETATE 80 MG/ML IJ SUSP
80.0000 mg | Freq: Once | INTRAMUSCULAR | Status: AC
Start: 2020-08-15 — End: 2020-08-15
  Administered 2020-08-15: 80 mg via INTRAMUSCULAR

## 2020-08-15 MED ORDER — KETOROLAC TROMETHAMINE 60 MG/2ML IM SOLN
30.0000 mg | Freq: Once | INTRAMUSCULAR | Status: AC
  Administered 2020-08-15: 30 mg via INTRAMUSCULAR

## 2020-08-15 NOTE — Telephone Encounter (Signed)
Patient seen today

## 2020-08-15 NOTE — Progress Notes (Signed)
Subjective:    Patient ID: Virginia Young, female    DOB: 04/18/1941, 80 y.o.   MRN: [id number]  Chief Complaint  Patient presents with  . Neck Pain    Neck Pain  This is a new problem. The current episode started more than 1 month ago. The problem occurs intermittently. The problem has been waxing and waning. The pain is present in the anterior neck. The quality of the pain is described as shooting. The pain is at a severity of 10/10. The pain is moderate. The symptoms are aggravated by twisting and bending. Associated symptoms include weakness. Pertinent negatives include no headaches, leg pain, numbness, pain with swallowing, photophobia, syncope, tingling, trouble swallowing, visual change or weight loss. She has tried bed rest, NSAIDs and acetaminophen for the symptoms. The treatment provided mild relief.      Review of Systems  Constitutional: Negative for weight loss.  HENT: Negative for trouble swallowing.   Eyes: Negative for photophobia.  Cardiovascular: Negative for syncope.  Musculoskeletal: Positive for neck pain.  Neurological: Positive for weakness. Negative for tingling, numbness and headaches.  All other systems reviewed and are negative.      Objective:   Physical Exam Vitals reviewed.  Constitutional:      General: She is not in acute distress.    Appearance: She is well-developed and well-nourished.  HENT:     Head: Normocephalic and atraumatic.     Mouth/Throat:     Mouth: Oropharynx is clear and moist.  Eyes:     Pupils: Pupils are equal, round, and reactive to light.  Neck:     Thyroid: No thyromegaly.  Cardiovascular:     Rate and Rhythm: Normal rate and regular rhythm.     Pulses: Intact distal pulses.     Heart sounds: Normal heart sounds. No murmur heard.   Pulmonary:     Effort: Pulmonary effort is normal. No respiratory distress.     Breath sounds: Normal breath sounds. No wheezing.  Abdominal:     General: Bowel sounds are normal.  There is no distension.     Palpations: Abdomen is soft.     Tenderness: There is no abdominal tenderness.  Musculoskeletal:        General: Tenderness present. No edema.     Cervical back: Normal range of motion and neck supple.     Comments: Pain in posterior neck with flexion and extension. Crying when twisting  Skin:    General: Skin is warm and dry.  Neurological:     Mental Status: She is alert and oriented to person, place, and time.     Cranial Nerves: No cranial nerve deficit.     Motor: Weakness present.     Gait: Gait abnormal.     Deep Tendon Reflexes: Reflexes are normal and symmetric.     Comments: Patient in wheelchair  Psychiatric:        Mood and Affect: Mood and affect normal.        Behavior: Behavior normal.        Thought Content: Thought content normal.        Judgment: Judgment normal.          BP (!) 166/111   Pulse (!) 104   Temp 98.3 F (36.8 C) (Temporal)   Ht 5\' 10"  (1.778 m)   BMI 32.08 kg/m   Assessment & Plan:  CHARMAYNE ODELL comes in today with chief complaint of Neck Pain   Diagnosis and orders  addressed:  1. Neck pain - predniSONE (STERAPRED UNI-PAK 21 TAB) 10 MG (21) TBPK tablet; Use as directed  Dispense: 21 tablet; Refill: 0 - methylPREDNISolone acetate (DEPO-MEDROL) injection 80 mg - ketorolac (TORADOL) injection 30 mg - traMADol (ULTRAM) 50 MG tablet; Take 1 tablet (50 mg total) by mouth every 8 (eight) hours as needed for up to 5 days.  Dispense: 20 tablet; Refill: 0  2. Cervical arthritis - predniSONE (STERAPRED UNI-PAK 21 TAB) 10 MG (21) TBPK tablet; Use as directed  Dispense: 21 tablet; Refill: 0 - methylPREDNISolone acetate (DEPO-MEDROL) injection 80 mg - ketorolac (TORADOL) injection 30 mg - traMADol (ULTRAM) 50 MG tablet; Take 1 tablet (50 mg total) by mouth every 8 (eight) hours as needed for up to 5 days.  Dispense: 20 tablet; Refill: 0   DO not start prednionse dose pack until tomorrow No NSAID's because she  is taking Eliqus. Will give 30 mg of Toradol injection today.  If pain continues will need Ortho referral  Will give #20 of Ultram as needed  Evelina Dun, FNP

## 2020-08-15 NOTE — Telephone Encounter (Signed)
Pt had televisit with [facility] yesterday for neck pain. She is her this morning for xray and is crying due to pain. Pt's caregiver is with her and is asking should she go to ER or if Dettinger could call in something for the pain.  Informed that Dettinger is out of the office. We will ask covering provider of recommendations and to hold on going to ER for the time being.  Pt is taking OTC Motrin/Tylenol and they are not helping the pain.

## 2020-08-17 ENCOUNTER — Telehealth: Payer: Self-pay | Admitting: Family

## 2020-08-17 ENCOUNTER — Other Ambulatory Visit: Payer: Self-pay | Admitting: Family

## 2020-08-17 DIAGNOSIS — M542 Cervicalgia: Secondary | ICD-10-CM

## 2020-08-17 DIAGNOSIS — I480 Paroxysmal atrial fibrillation: Secondary | ICD-10-CM

## 2020-08-17 DIAGNOSIS — M47812 Spondylosis without myelopathy or radiculopathy, cervical region: Secondary | ICD-10-CM

## 2020-08-17 NOTE — Telephone Encounter (Signed)
Patient aware and verbalized understanding. °

## 2020-08-17 NOTE — Telephone Encounter (Signed)
Pt's caregiver stated that pt was prescribed tramadol but it is not helping, wants to know if there is anything stronger that she can be prescribed

## 2020-08-17 NOTE — Telephone Encounter (Signed)
I have placed a referral to Ortho. She can take two Ultram at a time to see if this helps.

## 2020-08-18 MED ORDER — TRAMADOL HCL 50 MG PO TABS
50.0000 mg | ORAL_TABLET | Freq: Two times a day (BID) | ORAL | 0 refills | Status: DC | PRN
Start: 2020-08-18 — End: 2020-08-22

## 2020-08-18 NOTE — Telephone Encounter (Signed)
Virginia Young called--pt will need a refill on traMADol (ULTRAM) 50 MG tablet since she is taking 2 tablets now. She does not have an apt to ortho yet.

## 2020-08-18 NOTE — Telephone Encounter (Signed)
Prescription sent to pharmacy.

## 2020-08-18 NOTE — Addendum Note (Signed)
Addended by: Evelina Dun A on: 08/18/2020 10:08 AM   Modules accepted: Orders

## 2020-08-21 ENCOUNTER — Ambulatory Visit: Payer: Medicare Other | Admitting: Family

## 2020-08-22 ENCOUNTER — Ambulatory Visit (INDEPENDENT_AMBULATORY_CARE_PROVIDER_SITE_OTHER): Payer: Medicare Other | Admitting: Family

## 2020-08-22 ENCOUNTER — Encounter: Payer: Self-pay | Admitting: Family

## 2020-08-22 ENCOUNTER — Other Ambulatory Visit: Payer: Self-pay

## 2020-08-22 VITALS — BP 126/70 | HR 72 | Temp 96.4°F | Ht 70.0 in | Wt 223.0 lb

## 2020-08-22 DIAGNOSIS — M47812 Spondylosis without myelopathy or radiculopathy, cervical region: Secondary | ICD-10-CM

## 2020-08-22 DIAGNOSIS — R1011 Right upper quadrant pain: Secondary | ICD-10-CM | POA: Diagnosis not present

## 2020-08-22 DIAGNOSIS — M542 Cervicalgia: Secondary | ICD-10-CM

## 2020-08-22 DIAGNOSIS — Z87440 Personal history of urinary (tract) infections: Secondary | ICD-10-CM | POA: Diagnosis not present

## 2020-08-22 DIAGNOSIS — R350 Frequency of micturition: Secondary | ICD-10-CM | POA: Diagnosis not present

## 2020-08-22 LAB — URINALYSIS, COMPLETE
Bilirubin, UA: NEGATIVE
Glucose, UA: NEGATIVE
Ketones, UA: NEGATIVE
Nitrite, UA: NEGATIVE
Protein,UA: NEGATIVE
RBC, UA: NEGATIVE
Specific Gravity, UA: 1.015 (ref 1.005–1.030)
Urobilinogen, Ur: 0.2 mg/dL (ref 0.2–1.0)
pH, UA: 7 (ref 5.0–7.5)

## 2020-08-22 LAB — MICROSCOPIC EXAMINATION: RBC, Urine: NONE SEEN /hpf (ref 0–2)

## 2020-08-22 LAB — CBC WITH DIFFERENTIAL/PLATELET
Basophils Absolute: 0.1 10*3/uL (ref 0.0–0.2)
Basos: 1 %
EOS (ABSOLUTE): 0.4 10*3/uL (ref 0.0–0.4)
Eos: 3 %
Hematocrit: 42 % (ref 34.0–46.6)
Hemoglobin: 14.4 g/dL (ref 11.1–15.9)
Immature Grans (Abs): 0.3 10*3/uL — ABNORMAL HIGH (ref 0.0–0.1)
Immature Granulocytes: 2 %
Lymphocytes Absolute: 3.3 10*3/uL — ABNORMAL HIGH (ref 0.7–3.1)
Lymphs: 20 %
MCH: 30.1 pg (ref 26.6–33.0)
MCHC: 34.3 g/dL (ref 31.5–35.7)
MCV: 88 fL (ref 79–97)
Monocytes Absolute: 1.3 10*3/uL — ABNORMAL HIGH (ref 0.1–0.9)
Monocytes: 8 %
Neutrophils Absolute: 10.8 10*3/uL — ABNORMAL HIGH (ref 1.4–7.0)
Neutrophils: 66 %
Platelets: 223 10*3/uL (ref 150–450)
RBC: 4.79 x10E6/uL (ref 3.77–5.28)
RDW: 12.9 % (ref 11.7–15.4)
WBC: 16.2 10*3/uL — ABNORMAL HIGH (ref 3.4–10.8)

## 2020-08-22 LAB — CMP14+EGFR
ALT: 21 IU/L (ref 0–32)
AST: 14 IU/L (ref 0–40)
Albumin/Globulin Ratio: 1.5 (ref 1.2–2.2)
Albumin: 3.6 g/dL — ABNORMAL LOW (ref 3.7–4.7)
Alkaline Phosphatase: 96 IU/L (ref 44–121)
BUN/Creatinine Ratio: 17 (ref 12–28)
BUN: 18 mg/dL (ref 8–27)
Bilirubin Total: 0.4 mg/dL (ref 0.0–1.2)
CO2: 26 mmol/L (ref 20–29)
Calcium: 8.8 mg/dL (ref 8.7–10.3)
Chloride: 96 mmol/L (ref 96–106)
Creatinine, Ser: 1.03 mg/dL — ABNORMAL HIGH (ref 0.57–1.00)
Globulin, Total: 2.4 g/dL (ref 1.5–4.5)
Glucose: 69 mg/dL (ref 65–99)
Potassium: 5.3 mmol/L — ABNORMAL HIGH (ref 3.5–5.2)
Sodium: 135 mmol/L (ref 134–144)
Total Protein: 6 g/dL (ref 6.0–8.5)
eGFR: 55 mL/min/{1.73_m2} — ABNORMAL LOW (ref >59)

## 2020-08-22 MED ORDER — SELECT DISPOSABLE UNDERWEAR LG MISC: 1.0000 "application " | Freq: Two times a day (BID) | 3 refills | Status: DC

## 2020-08-22 MED ORDER — TRAMADOL HCL 50 MG PO TABS
50.0000 mg | ORAL_TABLET | Freq: Two times a day (BID) | ORAL | 0 refills | Status: DC | PRN
Start: 2020-09-05 — End: 2020-09-23

## 2020-08-22 NOTE — Progress Notes (Signed)
Subjective:    Patient ID: Virginia Young, female    DOB: 03-May-1941, 80 y.o.   MRN: [id number]    Chief Complaint  Patient presents with  . Generalized Body Aches   Pt presents to the office today with RUQ pain. She had a UTI and completed her Keflex Saturday.  Abdominal Pain This is a new problem. The current episode started yesterday. The problem occurs constantly. The pain is located in the RUQ. The pain is moderate. Associated symptoms include constipation, frequency and nausea. Pertinent negatives include no diarrhea, dysuria or vomiting. The pain is relieved by belching. She has tried nothing for the symptoms. The treatment provided no relief.  Neck Pain  This is a recurrent problem. The current episode started 1 to 4 weeks ago. The problem occurs intermittently. The problem has been waxing and waning. The pain is at a severity of 5/10. The pain is moderate. She has tried NSAIDs for the symptoms. The treatment provided mild relief.  Urinary Frequency  This is a recurrent problem. The current episode started 1 to 4 weeks ago. The problem occurs intermittently. The patient is experiencing no pain. Associated symptoms include frequency and nausea. Pertinent negatives include no vomiting. Her past medical history is significant for recurrent UTIs.      Review of Systems  Gastrointestinal: Positive for abdominal pain, constipation and nausea. Negative for diarrhea and vomiting.  Genitourinary: Positive for frequency. Negative for dysuria.  Musculoskeletal: Positive for neck pain.  All other systems reviewed and are negative.      Objective:   Physical Exam Vitals reviewed.  Constitutional:      General: She is not in acute distress.    Appearance: She is well-developed and well-nourished.  HENT:     Head: Normocephalic and atraumatic.     Ears:     Comments: HOH     Mouth/Throat:     Mouth: Oropharynx is clear and moist.  Eyes:     Pupils: Pupils are equal, round,  and reactive to light.  Neck:     Thyroid: No thyromegaly.  Cardiovascular:     Rate and Rhythm: Normal rate and regular rhythm.     Pulses: Intact distal pulses.     Heart sounds: Normal heart sounds. No murmur heard.   Pulmonary:     Effort: Pulmonary effort is normal. No respiratory distress.     Breath sounds: Normal breath sounds. No wheezing.  Abdominal:     General: Bowel sounds are normal. There is no distension.     Palpations: Abdomen is soft.     Tenderness: There is abdominal tenderness (RUQ). There is guarding.  Musculoskeletal:        General: No tenderness or edema. Normal range of motion.     Cervical back: Normal range of motion and neck supple.  Skin:    General: Skin is warm and dry.  Neurological:     Mental Status: She is alert and oriented to person, place, and time.     Cranial Nerves: No cranial nerve deficit.     Motor: Weakness present.     Gait: Gait abnormal (using cane to walk).     Deep Tendon Reflexes: Reflexes are normal and symmetric.  Psychiatric:        Mood and Affect: Mood and affect normal.        Behavior: Behavior normal.        Thought Content: Thought content normal.        Judgment:  Judgment normal.          BP 126/70   Pulse 72   Temp (!) 96.4 F (35.8 C) (Temporal)   Ht 5' 10"  (1.778 m)   Wt 223 lb (101.2 kg)   SpO2 94%   BMI 32.00 kg/m   Assessment & Plan:  LASHAYLA ARMES comes in today with chief complaint of Generalized Body Aches   Diagnosis and orders addressed:  1. Neck pain - traMADol (ULTRAM) 50 MG tablet; Take 1-2 tablets (50-100 mg total) by mouth every 12 (twelve) hours as needed.  Dispense: 90 tablet; Refill: 0 - CMP14+EGFR - CBC with Differential/Platelet  2. Cervical arthritis - traMADol (ULTRAM) 50 MG tablet; Take 1-2 tablets (50-100 mg total) by mouth every 12 (twelve) hours as needed.  Dispense: 90 tablet; Refill: 0 - CMP14+EGFR - CBC with Differential/Platelet  3. RUQ abdominal pain -  CMP14+EGFR - CBC with Differential/Platelet  4. Hx: UTI (urinary tract infection) - CMP14+EGFR - CBC with Differential/Platelet - Urinalysis, Complete - Urine Culture  5. Urinary frequency - CMP14+EGFR - CBC with Differential/Platelet - Urine Culture   Labs pending Keep follow up with Ortho If WBC elevated may need scan ordered.  Red flags discussed to go to ED     Evelina Dun, FNP

## 2020-08-22 NOTE — Patient Instructions (Signed)
Abdominal Pain, Adult Pain in the abdomen (abdominal pain) can be caused by many things. Often, abdominal pain is not serious and it gets better with no treatment or by being treated at home. However, sometimes abdominal pain is serious. Your health care provider will ask questions about your medical history and do a physical exam to try to determine the cause of your abdominal pain. Follow these instructions at home: Medicines  Take over-the-counter and prescription medicines only as told by your health care provider.  Do not take a laxative unless told by your health care provider. General instructions  Watch your condition for any changes.  Drink enough fluid to keep your urine pale yellow.  Keep all follow-up visits as told by your health care provider. This is important.   Contact a health care provider if:  Your abdominal pain changes or gets worse.  You are not hungry or you lose weight without trying.  You are constipated or have diarrhea for more than 2-3 days.  You have pain when you urinate or have a bowel movement.  Your abdominal pain wakes you up at night.  Your pain gets worse with meals, after eating, or with certain foods.  You are vomiting and cannot keep anything down.  You have a fever.  You have blood in your urine. Get help right away if:  Your pain does not go away as soon as your health care provider told you to expect.  You cannot stop vomiting.  Your pain is only in areas of the abdomen, such as the right side or the left lower portion of the abdomen. Pain on the right side could be caused by appendicitis.  You have bloody or black stools, or stools that look like tar.  You have severe pain, cramping, or bloating in your abdomen.  You have signs of dehydration, such as: ? Dark urine, very little urine, or no urine. ? Cracked lips. ? Dry mouth. ? Sunken eyes. ? Sleepiness. ? Weakness.  You have trouble breathing or chest  pain. Summary  Often, abdominal pain is not serious and it gets better with no treatment or by being treated at home. However, sometimes abdominal pain is serious.  Watch your condition for any changes.  Take over-the-counter and prescription medicines only as told by your health care provider.  Contact a health care provider if your abdominal pain changes or gets worse.  Get help right away if you have severe pain, cramping, or bloating in your abdomen. This information is not intended to replace advice given to you by your health care provider. Make sure you discuss any questions you have with your health care provider. Document Revised: 07/30/2019 Document Reviewed: 10/19/2018 Elsevier Patient Education  [employer].

## 2020-08-24 ENCOUNTER — Other Ambulatory Visit: Payer: Self-pay | Admitting: Family

## 2020-08-24 DIAGNOSIS — R1011 Right upper quadrant pain: Secondary | ICD-10-CM

## 2020-08-24 DIAGNOSIS — D72829 Elevated white blood cell count, unspecified: Secondary | ICD-10-CM

## 2020-08-25 ENCOUNTER — Other Ambulatory Visit: Payer: Self-pay | Admitting: Family

## 2020-08-25 LAB — URINE CULTURE

## 2020-08-25 MED ORDER — AMOXICILLIN-POT CLAVULANATE 875-125 MG PO TABS: 1.0000 | ORAL_TABLET | Freq: Two times a day (BID) | ORAL | 0 refills | Status: DC

## 2020-08-29 DIAGNOSIS — M5412 Radiculopathy, cervical region: Secondary | ICD-10-CM | POA: Diagnosis not present

## 2020-08-29 DIAGNOSIS — M5030 Other cervical disc degeneration, unspecified cervical region: Secondary | ICD-10-CM | POA: Diagnosis not present

## 2020-09-04 ENCOUNTER — Telehealth: Payer: Self-pay

## 2020-09-04 NOTE — Telephone Encounter (Signed)
I spoke with pt's caregiver Benjamine Mola who states pt hasn't felt well since Friday and isn't eating, she is nauseated and over the weekend the daughter said she had a fever but unsure of what her exact temp was. She is taking in fluids but not as much as usual and her urine is still very dark and has a foul odor. I advised we don't have admitting privileges but if pt was feverish over the weekend and not eating or drinking like normal she should be evaluated and advised to go to ER. Pt's caregiver states she will call the daughter and let her know.

## 2020-09-04 NOTE — Telephone Encounter (Signed)
Pts caregiver Benjamine Mola) called stating that her and pts daughter need [name] to admit pt to the hospital for IV antibiotics because pt has been on antibiotics for almost 3 wks for UTI and pt is not getting any better and urine is still dark with an odor.  Please advise and call Benjamine Mola at [phone].

## 2020-09-07 ENCOUNTER — Other Ambulatory Visit: Payer: Self-pay

## 2020-09-07 ENCOUNTER — Encounter: Payer: Self-pay | Admitting: Family Medicine

## 2020-09-07 ENCOUNTER — Ambulatory Visit (INDEPENDENT_AMBULATORY_CARE_PROVIDER_SITE_OTHER): Payer: Medicare Other

## 2020-09-07 ENCOUNTER — Ambulatory Visit (INDEPENDENT_AMBULATORY_CARE_PROVIDER_SITE_OTHER): Payer: Medicare Other | Admitting: Family Medicine

## 2020-09-07 VITALS — BP 112/72 | HR 80 | Temp 97.6°F | Ht 70.0 in | Wt 220.1 lb

## 2020-09-07 DIAGNOSIS — R112 Nausea with vomiting, unspecified: Secondary | ICD-10-CM

## 2020-09-07 DIAGNOSIS — N390 Urinary tract infection, site not specified: Secondary | ICD-10-CM

## 2020-09-07 DIAGNOSIS — K5900 Constipation, unspecified: Secondary | ICD-10-CM

## 2020-09-07 LAB — URINALYSIS, COMPLETE
Bilirubin, UA: NEGATIVE
Glucose, UA: NEGATIVE
Ketones, UA: NEGATIVE
Nitrite, UA: NEGATIVE
Protein,UA: NEGATIVE
RBC, UA: NEGATIVE
Specific Gravity, UA: 1.015 (ref 1.005–1.030)
Urobilinogen, Ur: 1 mg/dL (ref 0.2–1.0)
pH, UA: 7 (ref 5.0–7.5)

## 2020-09-07 LAB — MICROSCOPIC EXAMINATION: RBC, Urine: NONE SEEN /hpf (ref 0–2)

## 2020-09-07 MED ORDER — ONDANSETRON HCL 4 MG PO TABS
4.0000 mg | ORAL_TABLET | Freq: Three times a day (TID) | ORAL | 1 refills | Status: DC | PRN
Start: 2020-09-07 — End: 2020-09-12

## 2020-09-07 MED ORDER — NITROFURANTOIN MONOHYD MACRO 100 MG PO CAPS: 100.0000 mg | ORAL_CAPSULE | Freq: Two times a day (BID) | ORAL | 0 refills | Status: DC

## 2020-09-07 NOTE — Patient Instructions (Signed)
Abdominal Pain, Adult Pain in the abdomen (abdominal pain) can be caused by many things. Often, abdominal pain is not serious and it gets better with no treatment or by being treated at home. However, sometimes abdominal pain is serious. Your health care provider will ask questions about your medical history and do a physical exam to try to determine the cause of your abdominal pain. Follow these instructions at home: Medicines  Take over-the-counter and prescription medicines only as told by your health care provider.  Do not take a laxative unless told by your health care provider. General instructions  Watch your condition for any changes.  Drink enough fluid to keep your urine pale yellow.  Keep all follow-up visits as told by your health care provider. This is important.   Contact a health care provider if:  Your abdominal pain changes or gets worse.  You are not hungry or you lose weight without trying.  You are constipated or have diarrhea for more than 2-3 days.  You have pain when you urinate or have a bowel movement.  Your abdominal pain wakes you up at night.  Your pain gets worse with meals, after eating, or with certain foods.  You are vomiting and cannot keep anything down.  You have a fever.  You have blood in your urine. Get help right away if:  Your pain does not go away as soon as your health care provider told you to expect.  You cannot stop vomiting.  Your pain is only in areas of the abdomen, such as the right side or the left lower portion of the abdomen. Pain on the right side could be caused by appendicitis.  You have bloody or black stools, or stools that look like tar.  You have severe pain, cramping, or bloating in your abdomen.  You have signs of dehydration, such as: ? Dark urine, very little urine, or no urine. ? Cracked lips. ? Dry mouth. ? Sunken eyes. ? Sleepiness. ? Weakness.  You have trouble breathing or chest  pain. Summary  Often, abdominal pain is not serious and it gets better with no treatment or by being treated at home. However, sometimes abdominal pain is serious.  Watch your condition for any changes.  Take over-the-counter and prescription medicines only as told by your health care provider.  Contact a health care provider if your abdominal pain changes or gets worse.  Get help right away if you have severe pain, cramping, or bloating in your abdomen. This information is not intended to replace advice given to you by your health care provider. Make sure you discuss any questions you have with your health care provider. Document Revised: 07/30/2019 Document Reviewed: 10/19/2018 Elsevier Patient Education  [employer].

## 2020-09-07 NOTE — Progress Notes (Signed)
Acute Office Visit  Subjective:    Patient ID: Virginia Young, female    DOB: [birth year], 80 y.o.   MRN: [id number]  Chief Complaint  Patient presents with  . Urinary Tract Infection    HPI Patient is in today for recurrent UTI. She has been treated for a UTI starting on 08/22/20 with Rocephin IM and Augmentin. She just finished Augmentin yesterday. She denies dysuria, flank pain, or fever. She does report generalized abdominal pain that is intermittent for about 3 weeks. She also has had nausea and vomiting this week. She has been taking zofran with some improvement. She has been able to keep down fluids. She also has had a decreased appetite this week but has been able to keep down small amounts of food. She also reports constipation and has not had a BM for 3 days. She feels bloated. She has an abdominal CT scan scheduled on 09/19/20. She has had UTIs in the past that required IV antibiotics for treatment after failure of multiple oral antibiotics.   Past Medical History:  Diagnosis Date  . Allergy   . Atrial fibrillation (HCC)    coumadin  . Cataract   . Chest pain 03/2012   a. Lex MV 10/13:  EF 64%, dist ant and apical defect sugg of soft tissue atten, no ischemia  . Chronic systolic heart failure ([place])   . Depression   . GERD (gastroesophageal reflux disease)   . HLD (hyperlipidemia)   . Mild mitral regurgitation   . MS (multiple sclerosis) ([place])   . NICM (nonischemic cardiomyopathy) ([place])    a. neg CLite in 2003;  b. EF 40-45% in past;   c.  Echo 12/11: EF 35-40%, mild MR, mild LAE, mild RAE, small pericardial effusion   . Osteoarthritis   . Osteoporosis   . Stasis ulcer ([place])   . Vaginal prolapse without uterine prolapse   . Varicose veins     Past Surgical History:  Procedure Laterality Date  . ANTERIOR AND POSTERIOR VAGINAL REPAIR W/ SACROSPINOUS LIGAMENT SUSPENSION  2016   [facility]  . BLADDER SURGERY    . CARPAL TUNNEL RELEASE Right 08/22/2017   Procedure: CARPAL  TUNNEL RELEASE;  Surgeon: [name], MD;  Location: [facility];  Service: Orthopedics;  Laterality: Right;  . cataract surgery Bilateral   . EYE SURGERY    . INGUINAL HERNIA REPAIR     right  . LIPOMA EXCISION     h/o removed from upper back x 2  . REVERSE SHOULDER ARTHROPLASTY Right 08/22/2017   Procedure: REVERSE SHOULDER ARTHROPLASTY;  Surgeon: [name], MD;  Location: [facility];  Service: Orthopedics;  Laterality: Right;    Family History  Problem Relation Age of Onset  . Diabetes Father 91       diabetes and syncope  . Heart attack Father   . Heart attack Sister   . Heart attack Brother        all 5 brothers had MIs  . Stroke Brother   . Cancer Sister        bone  . Diabetes Sister   . Cancer Sister   . Diabetes Sister   . Heart attack Brother   . Dementia Mother   . Dementia Brother   . CAD Other   . Diabetes Daughter   . Colon cancer Neg Hx   . Neuropathy Neg Hx     Social History   Socioeconomic History  . Marital status: Divorced    Spouse  name: Not on file  . Number of children: 3  . Years of education: 14  . Highest education level: Not on file  Occupational History  . Occupation: retired    Fish farm manager: RETIRED  Tobacco Use  . Smoking status: Never Smoker  . Smokeless tobacco: Never Used  Vaping Use  . Vaping Use: Never used  Substance and Sexual Activity  . Alcohol use: No  . Drug use: No  . Sexual activity: Not Currently  Other Topics Concern  . Not on file  Social History Narrative   DESIGNATED PARTY RELEASE ON FILE. Fleet Contras, April 26, 2010 10:09 AM.      Lives at home alone. She has a caregiver Monday through Friday during the day   Right-handed   Caffeine: 1-2 cup of coffee daily   Social Determinants of Health   Financial Resource Strain: Low Risk   . Difficulty of Paying Living Expenses: Not hard at all  Food Insecurity: No Food Insecurity  . Worried About Charity fundraiser in the Last Year: Never true  . Ran Out of Food in  the Last Year: Never true  Transportation Needs: No Transportation Needs  . Lack of Transportation (Medical): No  . Lack of Transportation (Non-Medical): No  Physical Activity: Not on file  Stress: Not on file  Social Connections: Not on file  Intimate Partner Violence: Not on file    Outpatient Medications Prior to Visit  Medication Sig Dispense Refill  . [employer] D3 25 MCG (1000 UT) capsule TAKE 1 CAPSULE (1,000 UNITS TOTAL) BY MOUTH DAILY. 90 capsule 1  . diclofenac Sodium (VOLTAREN) 1 % GEL Apply 2 g topically daily as needed.    . divalproex (DEPAKOTE ER) 250 MG 24 hr tablet Take 1 tablet (250 mg total) by mouth daily. 90 tablet 3  . ELIQUIS 5 MG TABS tablet Take 1 tablet (5 mg total) by mouth every 12 (twelve) hours. 180 tablet 0  . furosemide (LASIX) 20 MG tablet Take 3 tablets (60 mg total) by mouth daily. 90 tablet 11  . Incontinence Supply Disposable (SELECT DISPOSABLE UNDERWEAR LG) MISC 1 application by Does not apply route in the morning and at bedtime. 60 each 3  . ketoconazole (NIZORAL) 2 % cream Apply 1 application topically daily. 15 g 0  . linaclotide (LINZESS) 72 MCG capsule Take 1 capsule (72 mcg total) by mouth daily before breakfast. 90 capsule 1  . losartan (COZAAR) 25 MG tablet TAKE 1/2 TABLET BY MOUTH EVERY DAY 45 tablet 3  . memantine (NAMENDA) 5 MG tablet Take 1 tablet (5 mg total) by mouth 2 (two) times daily. 180 tablet 3  . metoprolol tartrate (LOPRESSOR) 25 MG tablet Take 1 tablet (25 mg total) by mouth 2 (two) times daily. 180 tablet 3  . Multiple Vitamin (MULTIVITAMIN) tablet Take 1 tablet by mouth daily.    Marland Kitchen nystatin (MYCOSTATIN/NYSTOP) powder Apply 1 application topically 3 (three) times daily. 30 g 0  . ondansetron (ZOFRAN) 4 MG tablet Take 1 tablet (4 mg total) by mouth every 8 (eight) hours as needed for nausea or vomiting. 30 tablet 1  . OXYGEN Inhale into the lungs. 2 liters PRN - anytime when needed ( cardio rx'd)    . QUEtiapine (SEROQUEL) 50 MG tablet  Take 1 tablet (50 mg total) by mouth 2 (two) times daily. 180 tablet 1  . traMADol (ULTRAM) 50 MG tablet Take 1-2 tablets (50-100 mg total) by mouth every 12 (twelve) hours as needed. [place]  tablet 0  . amoxicillin-clavulanate (AUGMENTIN) 875-125 MG tablet Take 1 tablet by mouth 2 (two) times daily. 14 tablet 0   No facility-administered medications prior to visit.    Allergies  Allergen Reactions  . Bactrim [Sulfamethoxazole-Trimethoprim]     Mouth sores  . Lipitor [Atorvastatin Calcium]     myalgia  . Lisinopril     cough    Review of Systems As per HPI.     Objective:    Physical Exam Vitals and nursing note reviewed.  Constitutional:      General: She is not in acute distress.    Appearance: Normal appearance. She is not ill-appearing, toxic-appearing or diaphoretic.  Cardiovascular:     Rate and Rhythm: Normal rate and regular rhythm.     Heart sounds: Normal heart sounds. No murmur heard.   Pulmonary:     Effort: Pulmonary effort is normal.     Breath sounds: Normal breath sounds.  Abdominal:     General: Bowel sounds are normal. There is no distension.     Palpations: Abdomen is soft. There is no shifting dullness, fluid wave or mass.     Tenderness: There is no abdominal tenderness. There is no right CVA tenderness, left CVA tenderness, guarding or rebound. Negative signs include Murphy's sign and McBurney's sign.  Musculoskeletal:     Right lower leg: No edema.     Left lower leg: No edema.  Skin:    General: Skin is warm and dry.     Coloration: Skin is not jaundiced.  Neurological:     General: No focal deficit present.     Mental Status: She is alert and oriented to person, place, and time.  Psychiatric:        Mood and Affect: Mood normal.        Behavior: Behavior normal.    BP 112/72   Pulse 80   Temp 97.6 F (36.4 C) (Temporal)   Ht 5' 10"  (1.778 m)   Wt 220 lb 2 oz (99.8 kg)   SpO2 99% Comment: room air  BMI 31.58 kg/m  Wt Readings from Last  3 Encounters:  09/07/20 220 lb 2 oz (99.8 kg)  08/22/20 223 lb (101.2 kg)  08/11/20 223 lb 9.6 oz (101.4 kg)   Urine dipstick shows positive for leukocytes.  Micro exam: 11-30 WBC's per HPF, 0 RBC's per HPF and few + bacteria.  Health Maintenance Due  Topic Date Due  . COVID-19 Vaccine (3 - Booster for Moderna series) 03/08/2020    There are no preventive care reminders to display for this patient.   Lab Results  Component Value Date   TSH 1.420 02/02/2018   Lab Results  Component Value Date   WBC 16.2 (H) 08/22/2020   HGB 14.4 08/22/2020   HCT 42.0 08/22/2020   MCV 88 08/22/2020   PLT 223 08/22/2020   Lab Results  Component Value Date   NA 135 08/22/2020   K 5.3 (H) 08/22/2020   CO2 26 08/22/2020   GLUCOSE 69 08/22/2020   BUN 18 08/22/2020   CREATININE 1.03 (H) 08/22/2020   BILITOT 0.4 08/22/2020   ALKPHOS 96 08/22/2020   AST 14 08/22/2020   ALT 21 08/22/2020   PROT 6.0 08/22/2020   ALBUMIN 3.6 (L) 08/22/2020   CALCIUM 8.8 08/22/2020   ANIONGAP 11 01/12/2020   GFR 60.46 07/23/2013   Lab Results  Component Value Date   CHOL 196 05/15/2015   Lab Results  Component Value Date  HDL 73 05/15/2015   Lab Results  Component Value Date   LDLCALC 108 (H) 05/15/2015   Lab Results  Component Value Date   TRIG 74 05/15/2015   Lab Results  Component Value Date   CHOLHDL 2.7 05/15/2015   Lab Results  Component Value Date   HGBA1C 5.6 10/09/2017       Assessment & Plan:   Elanda was seen today for urinary tract infection.  Diagnoses and all orders for this visit:  Recurrent UTI Bacteria and increased WBC on micro. Just completed Augmentin. Culture on 08/22/20 susceptible to macrobid. Macrobid ordered as below.  -     Urinalysis, Complete -     nitrofurantoin, macrocrystal-monohydrate, (MACROBID) 100 MG capsule; Take 1 capsule (100 mg total) by mouth 2 (two) times daily for 5 days. 1 po BId -     Microscopic Examination  Nausea and vomiting,  intractability of vomiting not specified, unspecified vomiting type Zofran refilled. Stay well hydrated. Labs pending as below. KUB today in office- reports shows no evidence of obstruction. Abdominal exam benign today. Labs pending.  -     CBC with Differential -     CMP14+EGFR -     Lipase -     DG Abd 1 View; Future       -     ondansetron (ZOFRAN) 4 MG tablet; Take 1 tablet (4 mg       total) by mouth every 8 (eight) hours as needed for nausea or      Vomiting.  Constipation, unspecified constipation type Discussed miralax for constipation. KUB today- no evidence of obstruction.  -     DG Abd 1 View; Future  Patient is aware of when to seek emergency care.   Follow up appointment scheduled with PCP on 09/12/20 sooner for new or worsening symptoms.   The patient indicates understanding of these issues and agrees with the plan.  Gwenlyn Perking, FNP

## 2020-09-08 LAB — CBC WITH DIFFERENTIAL/PLATELET
Basophils Absolute: 0.1 10*3/uL (ref 0.0–0.2)
Basos: 1 %
EOS (ABSOLUTE): 0.3 10*3/uL (ref 0.0–0.4)
Eos: 3 %
Hematocrit: 43.9 % (ref 34.0–46.6)
Hemoglobin: 14.9 g/dL (ref 11.1–15.9)
Immature Grans (Abs): 0.1 10*3/uL (ref 0.0–0.1)
Immature Granulocytes: 1 %
Lymphocytes Absolute: 2.4 10*3/uL (ref 0.7–3.1)
Lymphs: 26 %
MCH: 29.8 pg (ref 26.6–33.0)
MCHC: 33.9 g/dL (ref 31.5–35.7)
MCV: 88 fL (ref 79–97)
Monocytes Absolute: 0.9 10*3/uL (ref 0.1–0.9)
Monocytes: 10 %
Neutrophils Absolute: 5.4 10*3/uL (ref 1.4–7.0)
Neutrophils: 59 %
Platelets: 246 10*3/uL (ref 150–450)
RBC: 5 x10E6/uL (ref 3.77–5.28)
RDW: 12.3 % (ref 11.7–15.4)
WBC: 9.2 10*3/uL (ref 3.4–10.8)

## 2020-09-08 LAB — CMP14+EGFR
ALT: 23 IU/L (ref 0–32)
AST: 27 IU/L (ref 0–40)
Albumin/Globulin Ratio: 1.5 (ref 1.2–2.2)
Albumin: 3.7 g/dL (ref 3.7–4.7)
Alkaline Phosphatase: 96 IU/L (ref 44–121)
BUN/Creatinine Ratio: 14 (ref 12–28)
BUN: 15 mg/dL (ref 8–27)
Bilirubin Total: 0.5 mg/dL (ref 0.0–1.2)
CO2: 26 mmol/L (ref 20–29)
Calcium: 9.1 mg/dL (ref 8.7–10.3)
Chloride: 96 mmol/L (ref 96–106)
Creatinine, Ser: 1.08 mg/dL — ABNORMAL HIGH (ref 0.57–1.00)
Globulin, Total: 2.5 g/dL (ref 1.5–4.5)
Glucose: 79 mg/dL (ref 65–99)
Potassium: 4.8 mmol/L (ref 3.5–5.2)
Sodium: 140 mmol/L (ref 134–144)
Total Protein: 6.2 g/dL (ref 6.0–8.5)
eGFR: 52 mL/min/{1.73_m2} — ABNORMAL LOW (ref >59)

## 2020-09-08 LAB — LIPASE: Lipase: 23 U/L (ref 14–85)

## 2020-09-12 ENCOUNTER — Encounter: Payer: Self-pay | Admitting: Family

## 2020-09-12 ENCOUNTER — Other Ambulatory Visit: Payer: Self-pay

## 2020-09-12 ENCOUNTER — Ambulatory Visit (INDEPENDENT_AMBULATORY_CARE_PROVIDER_SITE_OTHER): Payer: Medicare Other | Admitting: Family

## 2020-09-12 VITALS — BP 106/62 | HR 96 | Temp 96.9°F | Ht 70.0 in | Wt 220.0 lb

## 2020-09-12 DIAGNOSIS — R531 Weakness: Secondary | ICD-10-CM | POA: Diagnosis not present

## 2020-09-12 DIAGNOSIS — R1011 Right upper quadrant pain: Secondary | ICD-10-CM

## 2020-09-12 DIAGNOSIS — N390 Urinary tract infection, site not specified: Secondary | ICD-10-CM

## 2020-09-12 DIAGNOSIS — F0280 Dementia in other diseases classified elsewhere without behavioral disturbance: Secondary | ICD-10-CM

## 2020-09-12 DIAGNOSIS — G300 Alzheimer's disease with early onset: Secondary | ICD-10-CM | POA: Diagnosis not present

## 2020-09-12 DIAGNOSIS — F411 Generalized anxiety disorder: Secondary | ICD-10-CM | POA: Diagnosis not present

## 2020-09-12 DIAGNOSIS — F0281 Dementia in other diseases classified elsewhere with behavioral disturbance: Secondary | ICD-10-CM

## 2020-09-12 DIAGNOSIS — R112 Nausea with vomiting, unspecified: Secondary | ICD-10-CM

## 2020-09-12 DIAGNOSIS — Z111 Encounter for screening for respiratory tuberculosis: Secondary | ICD-10-CM | POA: Diagnosis not present

## 2020-09-12 LAB — URINALYSIS, COMPLETE
Bilirubin, UA: NEGATIVE
Glucose, UA: NEGATIVE
Ketones, UA: NEGATIVE
Nitrite, UA: POSITIVE — AB
Specific Gravity, UA: 1.015 (ref 1.005–1.030)
Urobilinogen, Ur: 1 mg/dL (ref 0.2–1.0)
pH, UA: 7.5 (ref 5.0–7.5)

## 2020-09-12 LAB — MICROSCOPIC EXAMINATION
Epithelial Cells (non renal): NONE SEEN /hpf (ref 0–10)
WBC, UA: >30 /hpf — AB (ref 0–5)

## 2020-09-12 MED ORDER — QUETIAPINE FUMARATE 200 MG PO TABS: 200.0000 mg | ORAL_TABLET | Freq: Every day | ORAL | 1 refills | Status: DC

## 2020-09-12 MED ORDER — ONDANSETRON HCL 4 MG PO TABS: 4.0000 mg | ORAL_TABLET | Freq: Three times a day (TID) | ORAL | 1 refills | Status: DC | PRN

## 2020-09-12 NOTE — Patient Instructions (Signed)
Weakness Weakness is a lack of strength. You may feel weak all over your body (generalized), or you may feel weak in one specific part of your body (focal). Common causes of weakness include:  Infection and immune system disorders.  Physical exhaustion.  Internal bleeding or other blood loss that results in a lack of red blood cells (anemia).  Dehydration.  An imbalance in mineral (electrolyte) levels, such as potassium.  Heart disease, circulation problems, or stroke. Other causes include:  Some medicines or cancer treatment.  Stress, anxiety, or depression.  Nervous system disorders.  Thyroid disorders.  Loss of muscle strength because of age or inactivity.  Poor sleep quality or sleep disorders. The cause of your weakness may not be known. Some causes of weakness can be serious, so it is important to see your health care provider. Follow these instructions at home: Activity  Rest as needed.  Try to get enough sleep. Most adults need 7-8 hours of quality sleep each night. Talk to your health care provider about how much sleep you need each night.  Do exercises, such as arm curls and leg raises, for 30 minutes at least 2 days a week or as told by your health care provider. This helps build muscle strength.  Consider working with a physical therapist or trainer who can develop an exercise plan to help you gain muscle strength. General instructions  Take over-the-counter and prescription medicines only as told by your health care provider.  Eat a healthy, well-balanced diet. This includes: ? Proteins to build muscles, such as lean meats and fish. ? Fresh fruits and vegetables. ? Carbohydrates to boost energy, such as whole grains.  Drink enough fluid to keep your urine pale yellow.  Keep all follow-up visits as told by your health care provider. This is important.   Contact a health care provider if your weakness:  Does not improve or gets worse.  Affects your  ability to think clearly.  Affects your ability to do your normal daily activities. Get help right away if you:  Develop sudden weakness, especially on one side of your face or body.  Have chest pain.  Have trouble breathing or shortness of breath.  Have problems with your vision.  Have trouble talking or swallowing.  Have trouble standing or walking.  Are light-headed or lose consciousness. Summary  Weakness is a lack of strength. You may feel weak all over your body or just in one specific part of your body.  Weakness can be caused by a variety of things. In some cases, the cause may be unknown.  Rest as needed, and try to get enough sleep. Most adults need 7-8 hours of quality sleep each night.  Eat a healthy, well-balanced diet. This information is not intended to replace advice given to you by your health care provider. Make sure you discuss any questions you have with your health care provider. Document Revised: 01/14/2018 Document Reviewed: 01/14/2018 Elsevier Patient Education  2021 Elsevier Inc.  

## 2020-09-12 NOTE — Addendum Note (Signed)
Addended by: Ladean Raya on: 09/12/2020 03:09 PM   Modules accepted: Orders

## 2020-09-12 NOTE — Progress Notes (Addendum)
Subjective:    Patient ID: Virginia Young, female    DOB: Sep 13, 1940, 80 y.o.   MRN: [id number]  No chief complaint on file.  PT presents to the office today with weakness. She has had a UTI and completed a Macrobid and reports her urine is improved.   She is in the process of trying to get into [facility].   She continues to constant RUQ abdominal pain. She has a CT scan scheduled for 09/19/20. She had a negative KUB 09/07/20.  Caregiver and daughter presents with patient. States her anxiety is increased and wanting to go to the ED every day because she "feels like she is dying". Pt has been able to live by herself and have a caregiver 2-3 hours a day until the last week. Daughter reports she is getting calls every morning that patient "feels horrible" and needs someone with her.  Abdominal Pain This is a new problem. The current episode started more than 1 month ago. The problem occurs constantly. The problem has been gradually improving. The pain is located in the RUQ. The abdominal pain radiates to the back. Associated symptoms include belching, flatus, frequency and nausea. Pertinent negatives include no dysuria, fever, hematochezia, hematuria, vomiting or weight loss. The pain is aggravated by movement. The pain is relieved by nothing. She has tried antacids (zofran) for the symptoms.      Review of Systems  Constitutional: Negative for fever and weight loss.  Gastrointestinal: Positive for abdominal pain, flatus and nausea. Negative for hematochezia and vomiting.  Genitourinary: Positive for frequency. Negative for dysuria and hematuria.  Neurological: Positive for weakness.  All other systems reviewed and are negative.      Objective:   Physical Exam Vitals reviewed.  Constitutional:      General: She is not in acute distress.    Appearance: She is well-developed. She is obese.  HENT:     Head: Normocephalic and atraumatic.  Eyes:     Pupils: Pupils are equal,  round, and reactive to light.  Neck:     Thyroid: No thyromegaly.  Cardiovascular:     Rate and Rhythm: Normal rate and regular rhythm.     Heart sounds: Normal heart sounds. No murmur heard.   Pulmonary:     Effort: Pulmonary effort is normal. No respiratory distress.     Breath sounds: Normal breath sounds. No wheezing.  Abdominal:     General: Bowel sounds are normal. There is no distension.     Palpations: Abdomen is soft.     Tenderness: There is no abdominal tenderness.     Comments: No pain on exam when pushing.   Musculoskeletal:        General: No tenderness. Normal range of motion.     Cervical back: Normal range of motion and neck supple.  Skin:    General: Skin is warm and dry.  Neurological:     Mental Status: She is alert and oriented to person, place, and time.     Cranial Nerves: No cranial nerve deficit.     Motor: Weakness present.     Gait: Gait abnormal (using walker).     Deep Tendon Reflexes: Reflexes are normal and symmetric.  Psychiatric:        Mood and Affect: Mood is anxious.        Behavior: Behavior normal.        Thought Content: Thought content normal.        Judgment: Judgment normal.  BP 106/62   Pulse 96   Temp (!) 96.9 F (36.1 C) (Temporal)   Ht 5\' 10"  (1.778 m)   Wt 220 lb (99.8 kg)   SpO2 90%   BMI 31.57 kg/m      Assessment & Plan:  1. Encounter for TB tine test - PPD  2. Recurrent UTI - Urinalysis, Complete  3. Weakness - Urinalysis, Complete  4. Nausea and vomiting, intractability of vomiting not specified, unspecified vomiting type - ondansetron (ZOFRAN) 4 MG tablet; Take 1 tablet (4 mg total) by mouth every 8 (eight) hours as needed for nausea or vomiting.  Dispense: 30 tablet; Refill: 1  5. Early onset Alzheimer's dementia without behavioral disturbance (HCC) Will increase seroquel to 200 mg from 50 mg BID Stress management  - QUEtiapine (SEROQUEL) 200 MG tablet; Take 1 tablet (200 mg total) by mouth at  bedtime.  Dispense: 90 tablet; Refill: 1  6. GAD (generalized anxiety disorder) - QUEtiapine (SEROQUEL) 200 MG tablet; Take 1 tablet (200 mg total) by mouth at bedtime.  Dispense: 90 tablet; Refill: 1  7. Right upper quadrant abdominal pain - CBC with Differential/Platelet - H Pylori, IGM, IGG, IGA AB   Labs reviewed with daughter and some pending Will complete Fl2 form today  Evelina Dun, FNP

## 2020-09-13 LAB — CBC WITH DIFFERENTIAL/PLATELET
Basophils Absolute: 0.1 10*3/uL (ref 0.0–0.2)
Basos: 1 %
EOS (ABSOLUTE): 0.3 10*3/uL (ref 0.0–0.4)
Eos: 3 %
Hematocrit: 45.8 % (ref 34.0–46.6)
Hemoglobin: 15.2 g/dL (ref 11.1–15.9)
Immature Grans (Abs): 0.1 10*3/uL (ref 0.0–0.1)
Immature Granulocytes: 1 %
Lymphocytes Absolute: 2.4 10*3/uL (ref 0.7–3.1)
Lymphs: 24 %
MCH: 29.7 pg (ref 26.6–33.0)
MCHC: 33.2 g/dL (ref 31.5–35.7)
MCV: 90 fL (ref 79–97)
Monocytes Absolute: 0.9 10*3/uL (ref 0.1–0.9)
Monocytes: 9 %
Neutrophils Absolute: 6 10*3/uL (ref 1.4–7.0)
Neutrophils: 62 %
Platelets: 294 10*3/uL (ref 150–450)
RBC: 5.12 x10E6/uL (ref 3.77–5.28)
RDW: 12.7 % (ref 11.7–15.4)
WBC: 9.7 10*3/uL (ref 3.4–10.8)

## 2020-09-13 LAB — H PYLORI, IGM, IGG, IGA AB
H pylori, IgM Abs: <9 units (ref 0.0–8.9)
H. pylori, IgA Abs: <9 units (ref 0.0–8.9)
H. pylori, IgG AbS: 1.13 Index Value — ABNORMAL HIGH (ref 0.00–0.79)

## 2020-09-14 MED ORDER — AMOXICILLIN-POT CLAVULANATE 875-125 MG PO TABS: 1.0000 | ORAL_TABLET | Freq: Two times a day (BID) | ORAL | 0 refills | Status: DC

## 2020-09-14 NOTE — Addendum Note (Signed)
Addended by: Evelina Dun A on: 09/14/2020 12:44 PM   Modules accepted: Orders

## 2020-09-14 NOTE — Progress Notes (Signed)
Patient came in today to have her PPD read.  Resultett was negative. Letter given with results.

## 2020-09-15 ENCOUNTER — Inpatient Hospital Stay (HOSPITAL_COMMUNITY)
Admission: EM | Admit: 2020-09-15 | Discharge: 2020-09-23 | DRG: 149 | Disposition: A | Discharge status: Skilled Nursing Facility | Payer: Medicare Other | Attending: Family Medicine | Admitting: Family Medicine

## 2020-09-15 ENCOUNTER — Emergency Department (HOSPITAL_COMMUNITY): Payer: Medicare Other

## 2020-09-15 ENCOUNTER — Other Ambulatory Visit: Payer: Self-pay

## 2020-09-15 ENCOUNTER — Encounter (HOSPITAL_COMMUNITY): Payer: Self-pay

## 2020-09-15 DIAGNOSIS — I639 Cerebral infarction, unspecified: Secondary | ICD-10-CM | POA: Diagnosis not present

## 2020-09-15 DIAGNOSIS — K219 Gastro-esophageal reflux disease without esophagitis: Secondary | ICD-10-CM | POA: Diagnosis present

## 2020-09-15 DIAGNOSIS — E441 Mild protein-calorie malnutrition: Secondary | ICD-10-CM | POA: Diagnosis present

## 2020-09-15 DIAGNOSIS — S41111A Laceration without foreign body of right upper arm, initial encounter: Secondary | ICD-10-CM | POA: Diagnosis present

## 2020-09-15 DIAGNOSIS — Z20822 Contact with and (suspected) exposure to covid-19: Secondary | ICD-10-CM | POA: Diagnosis present

## 2020-09-15 DIAGNOSIS — F32A Depression, unspecified: Secondary | ICD-10-CM | POA: Diagnosis present

## 2020-09-15 DIAGNOSIS — Z792 Long term (current) use of antibiotics: Secondary | ICD-10-CM

## 2020-09-15 DIAGNOSIS — Z888 Allergy status to other drugs, medicaments and biological substances status: Secondary | ICD-10-CM

## 2020-09-15 DIAGNOSIS — F0390 Unspecified dementia without behavioral disturbance: Secondary | ICD-10-CM

## 2020-09-15 DIAGNOSIS — Z9181 History of falling: Secondary | ICD-10-CM | POA: Diagnosis not present

## 2020-09-15 DIAGNOSIS — R29818 Other symptoms and signs involving the nervous system: Secondary | ICD-10-CM | POA: Diagnosis not present

## 2020-09-15 DIAGNOSIS — G319 Degenerative disease of nervous system, unspecified: Secondary | ICD-10-CM | POA: Diagnosis not present

## 2020-09-15 DIAGNOSIS — M79601 Pain in right arm: Secondary | ICD-10-CM | POA: Diagnosis not present

## 2020-09-15 DIAGNOSIS — F321 Major depressive disorder, single episode, moderate: Secondary | ICD-10-CM | POA: Diagnosis not present

## 2020-09-15 DIAGNOSIS — S40811A Abrasion of right upper arm, initial encounter: Secondary | ICD-10-CM

## 2020-09-15 DIAGNOSIS — Z7901 Long term (current) use of anticoagulants: Secondary | ICD-10-CM | POA: Diagnosis not present

## 2020-09-15 DIAGNOSIS — E222 Syndrome of inappropriate secretion of antidiuretic hormone: Secondary | ICD-10-CM | POA: Diagnosis present

## 2020-09-15 DIAGNOSIS — M542 Cervicalgia: Secondary | ICD-10-CM

## 2020-09-15 DIAGNOSIS — R202 Paresthesia of skin: Secondary | ICD-10-CM | POA: Diagnosis not present

## 2020-09-15 DIAGNOSIS — R61 Generalized hyperhidrosis: Secondary | ICD-10-CM | POA: Diagnosis not present

## 2020-09-15 DIAGNOSIS — E871 Hypo-osmolality and hyponatremia: Secondary | ICD-10-CM | POA: Diagnosis not present

## 2020-09-15 DIAGNOSIS — G459 Transient cerebral ischemic attack, unspecified: Secondary | ICD-10-CM | POA: Diagnosis not present

## 2020-09-15 DIAGNOSIS — Z87440 Personal history of urinary (tract) infections: Secondary | ICD-10-CM

## 2020-09-15 DIAGNOSIS — N390 Urinary tract infection, site not specified: Secondary | ICD-10-CM | POA: Diagnosis present

## 2020-09-15 DIAGNOSIS — R531 Weakness: Secondary | ICD-10-CM

## 2020-09-15 DIAGNOSIS — G35 Multiple sclerosis: Secondary | ICD-10-CM | POA: Diagnosis present

## 2020-09-15 DIAGNOSIS — I4891 Unspecified atrial fibrillation: Secondary | ICD-10-CM | POA: Diagnosis not present

## 2020-09-15 DIAGNOSIS — E782 Mixed hyperlipidemia: Secondary | ICD-10-CM | POA: Diagnosis present

## 2020-09-15 DIAGNOSIS — R55 Syncope and collapse: Secondary | ICD-10-CM | POA: Diagnosis not present

## 2020-09-15 DIAGNOSIS — W19XXXA Unspecified fall, initial encounter: Secondary | ICD-10-CM | POA: Diagnosis present

## 2020-09-15 DIAGNOSIS — R109 Unspecified abdominal pain: Secondary | ICD-10-CM | POA: Diagnosis not present

## 2020-09-15 DIAGNOSIS — G300 Alzheimer's disease with early onset: Secondary | ICD-10-CM

## 2020-09-15 DIAGNOSIS — R4182 Altered mental status, unspecified: Secondary | ICD-10-CM | POA: Diagnosis not present

## 2020-09-15 DIAGNOSIS — I10 Essential (primary) hypertension: Secondary | ICD-10-CM | POA: Diagnosis not present

## 2020-09-15 DIAGNOSIS — I6389 Other cerebral infarction: Secondary | ICD-10-CM | POA: Diagnosis not present

## 2020-09-15 DIAGNOSIS — E8809 Other disorders of plasma-protein metabolism, not elsewhere classified: Secondary | ICD-10-CM

## 2020-09-15 DIAGNOSIS — E669 Obesity, unspecified: Secondary | ICD-10-CM | POA: Diagnosis present

## 2020-09-15 DIAGNOSIS — Z8249 Family history of ischemic heart disease and other diseases of the circulatory system: Secondary | ICD-10-CM

## 2020-09-15 DIAGNOSIS — I110 Hypertensive heart disease with heart failure: Secondary | ICD-10-CM | POA: Diagnosis present

## 2020-09-15 DIAGNOSIS — M810 Age-related osteoporosis without current pathological fracture: Secondary | ICD-10-CM | POA: Diagnosis present

## 2020-09-15 DIAGNOSIS — Z79899 Other long term (current) drug therapy: Secondary | ICD-10-CM

## 2020-09-15 DIAGNOSIS — S41111D Laceration without foreign body of right upper arm, subsequent encounter: Secondary | ICD-10-CM | POA: Diagnosis not present

## 2020-09-15 DIAGNOSIS — I480 Paroxysmal atrial fibrillation: Secondary | ICD-10-CM | POA: Diagnosis present

## 2020-09-15 DIAGNOSIS — R58 Hemorrhage, not elsewhere classified: Secondary | ICD-10-CM | POA: Diagnosis not present

## 2020-09-15 DIAGNOSIS — I482 Chronic atrial fibrillation, unspecified: Secondary | ICD-10-CM | POA: Diagnosis present

## 2020-09-15 DIAGNOSIS — Z7401 Bed confinement status: Secondary | ICD-10-CM | POA: Diagnosis not present

## 2020-09-15 DIAGNOSIS — R42 Dizziness and giddiness: Secondary | ICD-10-CM | POA: Diagnosis not present

## 2020-09-15 DIAGNOSIS — I951 Orthostatic hypotension: Secondary | ICD-10-CM | POA: Diagnosis present

## 2020-09-15 DIAGNOSIS — I429 Cardiomyopathy, unspecified: Secondary | ICD-10-CM | POA: Diagnosis present

## 2020-09-15 DIAGNOSIS — M1990 Unspecified osteoarthritis, unspecified site: Secondary | ICD-10-CM | POA: Diagnosis not present

## 2020-09-15 DIAGNOSIS — I4892 Unspecified atrial flutter: Secondary | ICD-10-CM | POA: Diagnosis present

## 2020-09-15 DIAGNOSIS — M2550 Pain in unspecified joint: Secondary | ICD-10-CM | POA: Diagnosis not present

## 2020-09-15 DIAGNOSIS — F411 Generalized anxiety disorder: Secondary | ICD-10-CM

## 2020-09-15 DIAGNOSIS — N3281 Overactive bladder: Secondary | ICD-10-CM | POA: Diagnosis not present

## 2020-09-15 DIAGNOSIS — Z741 Need for assistance with personal care: Secondary | ICD-10-CM | POA: Diagnosis not present

## 2020-09-15 DIAGNOSIS — R2689 Other abnormalities of gait and mobility: Secondary | ICD-10-CM | POA: Diagnosis not present

## 2020-09-15 DIAGNOSIS — E559 Vitamin D deficiency, unspecified: Secondary | ICD-10-CM | POA: Diagnosis not present

## 2020-09-15 DIAGNOSIS — Z96611 Presence of right artificial shoulder joint: Secondary | ICD-10-CM | POA: Diagnosis present

## 2020-09-15 DIAGNOSIS — Y9301 Activity, walking, marching and hiking: Secondary | ICD-10-CM | POA: Diagnosis present

## 2020-09-15 DIAGNOSIS — G901 Familial dysautonomia [Riley-Day]: Secondary | ICD-10-CM | POA: Diagnosis present

## 2020-09-15 DIAGNOSIS — H8113 Benign paroxysmal vertigo, bilateral: Principal | ICD-10-CM | POA: Diagnosis present

## 2020-09-15 DIAGNOSIS — H269 Unspecified cataract: Secondary | ICD-10-CM | POA: Diagnosis not present

## 2020-09-15 DIAGNOSIS — Z6834 Body mass index (BMI) 34.0-34.9, adult: Secondary | ICD-10-CM

## 2020-09-15 DIAGNOSIS — M6281 Muscle weakness (generalized): Secondary | ICD-10-CM | POA: Diagnosis not present

## 2020-09-15 DIAGNOSIS — E861 Hypovolemia: Secondary | ICD-10-CM | POA: Diagnosis present

## 2020-09-15 DIAGNOSIS — I5022 Chronic systolic (congestive) heart failure: Secondary | ICD-10-CM | POA: Diagnosis present

## 2020-09-15 DIAGNOSIS — E876 Hypokalemia: Secondary | ICD-10-CM | POA: Diagnosis not present

## 2020-09-15 DIAGNOSIS — F0280 Dementia in other diseases classified elsewhere without behavioral disturbance: Secondary | ICD-10-CM

## 2020-09-15 DIAGNOSIS — M1611 Unilateral primary osteoarthritis, right hip: Secondary | ICD-10-CM | POA: Diagnosis not present

## 2020-09-15 DIAGNOSIS — M47812 Spondylosis without myelopathy or radiculopathy, cervical region: Secondary | ICD-10-CM

## 2020-09-15 DIAGNOSIS — K6389 Other specified diseases of intestine: Secondary | ICD-10-CM | POA: Diagnosis present

## 2020-09-15 DIAGNOSIS — B965 Pseudomonas (aeruginosa) (mallei) (pseudomallei) as the cause of diseases classified elsewhere: Secondary | ICD-10-CM | POA: Diagnosis present

## 2020-09-15 DIAGNOSIS — M1711 Unilateral primary osteoarthritis, right knee: Secondary | ICD-10-CM | POA: Diagnosis not present

## 2020-09-15 DIAGNOSIS — R404 Transient alteration of awareness: Secondary | ICD-10-CM | POA: Diagnosis not present

## 2020-09-15 DIAGNOSIS — I959 Hypotension, unspecified: Secondary | ICD-10-CM | POA: Diagnosis not present

## 2020-09-15 DIAGNOSIS — I4821 Permanent atrial fibrillation: Secondary | ICD-10-CM | POA: Diagnosis not present

## 2020-09-15 DIAGNOSIS — R41841 Cognitive communication deficit: Secondary | ICD-10-CM | POA: Diagnosis not present

## 2020-09-15 DIAGNOSIS — M47816 Spondylosis without myelopathy or radiculopathy, lumbar region: Secondary | ICD-10-CM | POA: Diagnosis not present

## 2020-09-15 DIAGNOSIS — R0902 Hypoxemia: Secondary | ICD-10-CM | POA: Diagnosis not present

## 2020-09-15 DIAGNOSIS — R29701 NIHSS score 1: Secondary | ICD-10-CM | POA: Diagnosis present

## 2020-09-15 DIAGNOSIS — Z7409 Other reduced mobility: Secondary | ICD-10-CM | POA: Diagnosis not present

## 2020-09-15 LAB — CBC
HCT: 42.9 % (ref 36.0–46.0)
Hemoglobin: 13.8 g/dL (ref 12.0–15.0)
MCH: 29.6 pg (ref 26.0–34.0)
MCHC: 32.2 g/dL (ref 30.0–36.0)
MCV: 92.1 fL (ref 80.0–100.0)
Platelets: 225 10*3/uL (ref 150–400)
RBC: 4.66 MIL/uL (ref 3.87–5.11)
RDW: 13.9 % (ref 11.5–15.5)
WBC: 9.8 10*3/uL (ref 4.0–10.5)
nRBC: 0 % (ref 0.0–0.2)

## 2020-09-15 LAB — COMPREHENSIVE METABOLIC PANEL
ALT: 23 U/L (ref 0–44)
AST: 25 U/L (ref 15–41)
Albumin: 3.1 g/dL — ABNORMAL LOW (ref 3.5–5.0)
Alkaline Phosphatase: 70 U/L (ref 38–126)
Anion gap: 9 (ref 5–15)
BUN: 19 mg/dL (ref 8–23)
CO2: 29 mmol/L (ref 22–32)
Calcium: 8.1 mg/dL — ABNORMAL LOW (ref 8.9–10.3)
Chloride: 99 mmol/L (ref 98–111)
Creatinine, Ser: 1.32 mg/dL — ABNORMAL HIGH (ref 0.44–1.00)
GFR, Estimated: 41 mL/min — ABNORMAL LOW (ref >60)
Glucose, Bld: 125 mg/dL — ABNORMAL HIGH (ref 70–99)
Potassium: 3.3 mmol/L — ABNORMAL LOW (ref 3.5–5.1)
Sodium: 137 mmol/L (ref 135–145)
Total Bilirubin: 0.7 mg/dL (ref 0.3–1.2)
Total Protein: 6.1 g/dL — ABNORMAL LOW (ref 6.5–8.1)

## 2020-09-15 LAB — ETHANOL: Alcohol, Ethyl (B): <10 mg/dL (ref <10)

## 2020-09-15 LAB — DIFFERENTIAL
Abs Immature Granulocytes: 0.08 10*3/uL — ABNORMAL HIGH (ref 0.00–0.07)
Basophils Absolute: 0.1 10*3/uL (ref 0.0–0.1)
Basophils Relative: 1 %
Eosinophils Absolute: 0.3 10*3/uL (ref 0.0–0.5)
Eosinophils Relative: 3 %
Immature Granulocytes: 1 %
Lymphocytes Relative: 19 %
Lymphs Abs: 1.9 10*3/uL (ref 0.7–4.0)
Monocytes Absolute: 1 10*3/uL (ref 0.1–1.0)
Monocytes Relative: 10 %
Neutro Abs: 6.5 10*3/uL (ref 1.7–7.7)
Neutrophils Relative %: 66 %

## 2020-09-15 LAB — VALPROIC ACID LEVEL: Valproic Acid Lvl: 25 ug/mL — ABNORMAL LOW (ref 50.0–100.0)

## 2020-09-15 LAB — URINE CULTURE

## 2020-09-15 LAB — PROTIME-INR
INR: 1.6 — ABNORMAL HIGH (ref 0.8–1.2)
Prothrombin Time: 18.2 seconds — ABNORMAL HIGH (ref 11.4–15.2)

## 2020-09-15 LAB — RESP PANEL BY RT-PCR (FLU A&B, COVID) ARPGX2
Influenza A by PCR: NEGATIVE
Influenza B by PCR: NEGATIVE
SARS Coronavirus 2 by RT PCR: NEGATIVE

## 2020-09-15 LAB — APTT: aPTT: 32 seconds (ref 24–36)

## 2020-09-15 MED ORDER — TETANUS-DIPHTH-ACELL PERTUSSIS 5-2.5-18.5 LF-MCG/0.5 IM SUSY
0.5000 mL | PREFILLED_SYRINGE | Freq: Once | INTRAMUSCULAR | Status: DC
  Filled 2020-09-15: qty 0.5

## 2020-09-15 MED ORDER — BACITRACIN ZINC 500 UNIT/GM EX OINT
1.0000 "application " | TOPICAL_OINTMENT | Freq: Two times a day (BID) | CUTANEOUS | Status: DC
  Administered 2020-09-15 – 2020-09-23 (×15): 1 via TOPICAL
  Filled 2020-09-15: qty 1.8
  Filled 2020-09-15: qty 28.4

## 2020-09-15 NOTE — ED Notes (Signed)
PT arrives from home by EMS post syncopal fall while in the kitchen. Pt states she lives alone, has a caregiver that comes over three days a week to check on her. She was standing in her kitchen and became dizzy. States "I think I fell out all the way and hit my head." Reports she initially had right temple pain when EMS arrived but now denies head pain. States she was able to crawl to her phone, called her caregiver who came over the the house. Caregiver called 911. EMS states her SBP was initially in the 60's and the established IV and administered 500 ml NS bolus en route. Bandaged right upper arm skin tear with bleeding currently controlled. Pt states she frequently looses her balance and falls, prior fractures to right arm in multiple places. At this time c/o pain to right shoulder and arm. A&Ox3, RR even and nonlabored. Wears O2 via [place] at 2L at home. Denies dyspnea. Skin warm and dry. Lungs CTA. Right arm weaker than left, pt denies this being acute, states it has been weaker for "several years."

## 2020-09-15 NOTE — ED Notes (Signed)
Neuro cart in room.  

## 2020-09-15 NOTE — Consult Note (Signed)
[facility] Tele[facility]urology Consult Services  Stat Consult  Date of Service:   09/15/2020 21:26:33  Diagnosis:     .  R53.1 - Weakness  Impression: 64 F, h/o HTN and afib on eliquis, here with LOC in the setting of suspected syncope and preceding lightheadedness. Reports R arm wkness for the past 1-2 wks and R arm numbness for yrs. NIHSS 1 for sensory deficit. Exam notable for weak grip on the R. Head CT neg for acute abnl.   PLAN  - MRI brain w/o contrast - MRA head/neck w/ and w/o contrast  - TTE  - monitor on tele for significant arrhythmias  --  CT HEAD: Reviewed  Metrics: [facility] Notification Time: 09/15/2020 21:24:56 Stamp Time: 09/15/2020 21:26:33 Callback Response Time: 09/15/2020 21:26:58  ----------------------------------------------------------------------------------------------------  Chief Complaint: fall, LOC  History of Present Illness: Patient is a 80 year old Female.  72 F, h/o HTN and afib on eliquis who is here with episdoe of syncope and LOC. Occurred at 6 PM. She was standing in her kitchen, developed a room spinning sensation and lightheadedness, and collapsed to the ground, hitting her head against her walker. She was out for less than a min and came to immediately. Brought to ER for eval where he reports weakness of her R arm for the past 1-2 weeks. Exam notable for weak R grip. She also reports numbness of her R arm that she has felt for "years." She also notes it has been more difficult for her to ambulate with her walker this past week.  Examination: 1A: Level of Consciousness - Alert; keenly responsive + 0 1B: Ask Month and Age - Both Questions Right + 0 1C: Blink Eyes & Squeeze Hands - Performs Both Tasks + 0 2: Test Horizontal Extraocular Movements - Normal + 0 3: Test Visual Fields - No Visual Loss + 0 4: Test Facial Palsy (Use Grimace if Obtunded) - Normal symmetry + 0 5A: Test Left Arm Motor Drift - No Drift  for 10 Seconds + 0 5B: Test Right Arm Motor Drift - No Drift for 10 Seconds + 0 6A: Test Left Leg Motor Drift - No Drift for 5 Seconds + 0 6B: Test Right Leg Motor Drift - No Drift for 5 Seconds + 0 7: Test Limb Ataxia (FNF/Heel-Shin) - No Ataxia + 0 8: Test Sensation - Mild-Moderate Loss: Less Sharp/More Dull + 1 9: Test Language/Aphasia - Normal; No aphasia + 0 10: Test Dysarthria - Normal + 0 11: Test Extinction/Inattention - No abnormality + 0  NIHSS Score: 1    Patient / Family was informed the Neurology Consult would occur via TeleHealth consult by way of interactive audio and video telecommunications and consented to receiving care in this manner.  Patient is being evaluated for possible acute neurologic impairment and high probability of imminent or life - threatening deterioration.I spent total of 20 minutes providing care to this patient, including time for face to face visit via telemedicine, review of medical records, imaging studies and discussion of findings with providers, the patient and / or family.   Dr Burtis Junes   [facility] [phone]  Case [id number]

## 2020-09-15 NOTE — ED Notes (Signed)
Right skin wound cleaned with saline, patted dry. Bacitracin ointment applied to wound, nonadhesive bandage placed around arm. Pt tolerated well, no active bleeding.

## 2020-09-15 NOTE — ED Notes (Signed)
[facility] responded, speaking with pt.

## 2020-09-15 NOTE — H&P (Incomplete)
History and Physical  Virginia Young VPX:[id number] DOB: Sep 10, 1940 DOA: 09/15/2020  Referring physician: ***  PCP: Sharion Balloon, FNP  Outpatient Specialists: *** Patient coming from: *** & is able to ambulate ***  Chief Complaint: ***   HPI: Virginia Young is a 80 y.o. female with medical history significant for *** (For level 3, the HPI must include 4+ descriptors: Location, Quality, Severity, Duration, Timing, Context, modifying factors, associated signs/symptoms and/or status of 3+ chronic problems.)   ED Course: ***  Review of Systems: Review of systems as noted in the HPI. All other systems reviewed and are negative.   Past Medical History:  Diagnosis Date  . Allergy   . Atrial fibrillation (HCC)    coumadin  . Cataract   . Chest pain 03/2012   a. Lex MV 10/13:  EF 64%, dist ant and apical defect sugg of soft tissue atten, no ischemia  . Chronic systolic heart failure ([place])   . Depression   . GERD (gastroesophageal reflux disease)   . HLD (hyperlipidemia)   . Mild mitral regurgitation   . MS (multiple sclerosis) ([place])   . NICM (nonischemic cardiomyopathy) ([place])    a. neg CLite in 2003;  b. EF 40-45% in past;   c.  Echo 12/11: EF 35-40%, mild MR, mild LAE, mild RAE, small pericardial effusion   . Osteoarthritis   . Osteoporosis   . Stasis ulcer ([place])   . Vaginal prolapse without uterine prolapse   . Varicose veins    Past Surgical History:  Procedure Laterality Date  . ANTERIOR AND POSTERIOR VAGINAL REPAIR W/ SACROSPINOUS LIGAMENT SUSPENSION  2016   [facility]  . BLADDER SURGERY    . CARPAL TUNNEL RELEASE Right 08/22/2017   Procedure: CARPAL TUNNEL RELEASE;  Surgeon: [name], MD;  Location: [facility];  Service: Orthopedics;  Laterality: Right;  . cataract surgery Bilateral   . EYE SURGERY    . INGUINAL HERNIA REPAIR     right  . LIPOMA EXCISION     h/o removed from upper back x 2  . REVERSE SHOULDER ARTHROPLASTY Right 08/22/2017   Procedure: REVERSE  SHOULDER ARTHROPLASTY;  Surgeon: [name], MD;  Location: [facility];  Service: Orthopedics;  Laterality: Right;    Social History:  reports that she has never smoked. She has never used smokeless tobacco. She reports that she does not drink alcohol and does not use drugs.   Allergies  Allergen Reactions  . Bactrim [Sulfamethoxazole-Trimethoprim]     Mouth sores  . Lipitor [Atorvastatin Calcium]     myalgia  . Lisinopril     cough    Family History  Problem Relation Age of Onset  . Diabetes Father 10       diabetes and syncope  . Heart attack Father   . Heart attack Sister   . Heart attack Brother        all 5 brothers had MIs  . Stroke Brother   . Cancer Sister        bone  . Diabetes Sister   . Cancer Sister   . Diabetes Sister   . Heart attack Brother   . Dementia Mother   . Dementia Brother   . CAD Other   . Diabetes Daughter   . Colon cancer Neg Hx   . Neuropathy Neg Hx     ***  Prior to Admission medications   Medication Sig Start Date End Date Taking? Authorizing Provider  amoxicillin-clavulanate (AUGMENTIN) 500-125 MG tablet  Take 1 tablet by mouth in the morning and at bedtime.   Yes [provider]  [employer] D3 25 MCG (1000 UT) capsule TAKE 1 CAPSULE (1,000 UNITS TOTAL) BY MOUTH DAILY. 09/27/19  Yes Rakes, Connye Burkitt, FNP  diclofenac Sodium (VOLTAREN) 1 % GEL Apply 2 g topically daily as needed (For pain).   Yes [provider]  divalproex (DEPAKOTE ER) 250 MG 24 hr tablet Take 1 tablet (250 mg total) by mouth daily. 12/22/19  Yes Lomax, Amy, NP  ELIQUIS 5 MG TABS tablet Take 1 tablet (5 mg total) by mouth every 12 (twelve) hours. 08/17/20  Yes Hawks, Christy A, FNP  furosemide (LASIX) 20 MG tablet Take 3 tablets (60 mg total) by mouth daily. 11/10/19  Yes Minus Breeding, MD  Incontinence Supply Disposable (SELECT DISPOSABLE UNDERWEAR LG) MISC 1 application by Does not apply route in the morning and at bedtime. 08/22/20  Yes Hawks, Christy A, FNP   ketoconazole (NIZORAL) 2 % cream Apply 1 application topically daily. 08/11/20  Yes Hawks, Theador Hawthorne, FNP  linaclotide (LINZESS) 72 MCG capsule Take 1 capsule (72 mcg total) by mouth daily before breakfast. 01/19/20  Yes Hawks, Christy A, FNP  losartan (COZAAR) 25 MG tablet TAKE 1/2 TABLET BY MOUTH EVERY DAY Patient taking differently: Take 12.5 mg by mouth daily. 09/21/19  Yes Minus Breeding, MD  memantine (NAMENDA) 5 MG tablet Take 1 tablet (5 mg total) by mouth 2 (two) times daily. 11/24/19  Yes Lomax, Amy, NP  metoprolol tartrate (LOPRESSOR) 25 MG tablet Take 1 tablet (25 mg total) by mouth 2 (two) times daily. 09/06/19 01/12/20 Yes Minus Breeding, MD  Multiple Vitamin (MULTIVITAMIN) tablet Take 1 tablet by mouth daily.   Yes [provider]  nystatin (MYCOSTATIN/NYSTOP) powder Apply 1 application topically 3 (three) times daily. 08/11/20  Yes Hawks, Christy A, FNP  ondansetron (ZOFRAN) 4 MG tablet Take 1 tablet (4 mg total) by mouth every 8 (eight) hours as needed for nausea or vomiting. 09/12/20  Yes Evelina Dun A, FNP  OXYGEN Inhale into the lungs. 2 liters PRN - anytime when needed ( cardio rx'd)   Yes [provider]  QUEtiapine (SEROQUEL) 200 MG tablet Take 1 tablet (200 mg total) by mouth at bedtime. Patient taking differently: Take 200 mg by mouth daily. 09/12/20  Yes Hawks, Christy A, FNP  traMADol (ULTRAM) 50 MG tablet Take 1-2 tablets (50-100 mg total) by mouth every 12 (twelve) hours as needed. 09/05/20   Sharion Balloon, FNP    Physical Exam: BP 128/90   Pulse 75   Temp 98.2 F (36.8 C) (Oral)   Resp 17   Ht 5\' 6"  (1.676 m)   Wt 100.7 kg   SpO2 100%   BMI 35.83 kg/m   . General: 80 y.o. year-old female well developed well nourished in no acute distress.  Alert and oriented x3. . Cardiovascular: Regular rate and rhythm with no rubs or gallops.  No thyromegaly or JVD noted.  No lower extremity edema. 2/4 pulses in all 4 extremities. Marland Kitchen Respiratory: Clear to  auscultation with no wheezes or rales. Good inspiratory effort. . Abdomen: Soft nontender nondistended with normal bowel sounds x4 quadrants. . Muskuloskeletal: No cyanosis, clubbing or edema noted bilaterally . Neuro: CN II-XII intact, strength, sensation, reflexes . Skin: No ulcerative lesions noted or rashes . Psychiatry: Judgement and insight appear normal. Mood is appropriate for condition and setting          Labs on Admission:  Basic Metabolic Panel: Recent Labs  Lab 09/15/20 2038  NA 137  K 3.3*  CL 99  CO2 29  GLUCOSE 125*  BUN 19  CREATININE 1.32*  CALCIUM 8.1*   Liver Function Tests: Recent Labs  Lab 09/15/20 2038  AST 25  ALT 23  ALKPHOS 70  BILITOT 0.7  PROT 6.1*  ALBUMIN 3.1*   No results for input(s): LIPASE, AMYLASE in the last 168 hours. No results for input(s): AMMONIA in the last 168 hours. CBC: Recent Labs  Lab 09/12/20 1317 09/15/20 2038  WBC 9.7 9.8  NEUTROABS 6.0 6.5  HGB 15.2 13.8  HCT 45.8 42.9  MCV 90 92.1  PLT 294 225   Cardiac Enzymes: No results for input(s): CKTOTAL, CKMB, CKMBINDEX, TROPONINI in the last 168 hours.  BNP (last 3 results) No results for input(s): BNP in the last 8760 hours.  ProBNP (last 3 results) No results for input(s): PROBNP in the last 8760 hours.  CBG: No results for input(s): GLUCAP in the last 168 hours.  Radiological Exams on Admission: CT HEAD WO CONTRAST  Result Date: 09/15/2020 CLINICAL DATA:  Neuro deficit, stroke suspected, right-sided numbness. EXAM: CT HEAD WITHOUT CONTRAST TECHNIQUE: Contiguous axial images were obtained from the base of the skull through the vertex without intravenous contrast. COMPARISON:  MRI brain February 27, 2018 and head CT July 25, 2016 FINDINGS: Brain: No evidence of acute large vascular territory infarction, hemorrhage, hydrocephalus, extra-axial collection or mass lesion/mass effect. Age related global parenchymal volume loss with ex vacuo dilatation of  ventricular system, not significantly changed from prior. Similar mild to moderate burden of chronic small vessel white matter ischemic disease. Vascular: No hyperdense vessel. Atherosclerotic calcifications of the intracranial portions of the internal carotid arteries. Skull: Normal. Negative for fracture or focal lesion. Sinuses/Orbits: Paranasal sinuses are predominantly clear. Orbits are unremarkable. Other: Streak artifact from dental hardware. Cerumen in the left external auditory canal. IMPRESSION: 1. No acute intracranial abnormality. 2. Similar age-related global parenchymal volume loss and chronic small vessel white matter ischemic disease. Electronically Signed   By: Dahlia Bailiff MD   On: 09/15/2020 20:58   DG Knee Complete 4 Views Right  Result Date: 09/15/2020 CLINICAL DATA:  Pain after fall. EXAM: RIGHT KNEE - COMPLETE 4+ VIEW COMPARISON:  None. FINDINGS: No evidence of fracture, dislocation, or joint effusion. Tricompartment degenerative change of the knee most advanced in the medial compartment where it appears moderate to severe. Extensive vascular calcifications. IMPRESSION: 1. No acute osseous abnormality. 2. Tricompartment degenerative change of the knee, most advanced in the medial compartment. Electronically Signed   By: Dahlia Bailiff MD   On: 09/15/2020 21:24   DG Humerus Right  Result Date: 09/15/2020 CLINICAL DATA:  Arm pain after fall today. EXAM: RIGHT HUMERUS - 2+ VIEW COMPARISON:  Radiograph December 23, 2017 and August 05, 2018 FINDINGS: Reverse right total shoulder arthroplasty. There are well corticated osseous fragments along the inferior aspect of the humeral head which were not definitely visualized on chest radiograph August 05, 2018. There is no evidence of image shaft or distal humeral fracture. Degenerative change at the [facility] joint with inferior acromial spurring. Soft tissues are unremarkable. IMPRESSION: Well corticated osseous fragments along the inferior aspect of  the humeral head which were not definitely visualized on chest radiograph August 05, 2018. Findings which may represent heterotopic ossification versus fracture fragments of uncertain chronicity. Recommend correlation with point tenderness and possible dedicated shoulder radiographs. Electronically Signed   By: Dahlia Bailiff  MD   On: 09/15/2020 21:17   DG Hip Unilat W or Wo Pelvis 2-3 Views Right  Result Date: 09/15/2020 CLINICAL DATA:  Pain after fall. EXAM: DG HIP (WITH OR WITHOUT PELVIS) 2-3V RIGHT COMPARISON:  Radiograph June 05, 2017. FINDINGS: There is no evidence of hip fracture or dislocation. Mild degenerative change of the hips lower lumbar spine and pubic symphysis . IMPRESSION: No fracture or dislocation of the right hip. If there is high clinical suspicion for occult fracture or the patient refuses to weightbear, consider further evaluation with MRI or CT. Electronically Signed   By: Dahlia Bailiff MD   On: 09/15/2020 21:24    EKG: I independently viewed the EKG done and my findings are as followed: ***   Assessment/Plan Present on Admission: . Acute CVA (cerebrovascular accident) [facility])  Active Problems:   Acute CVA (cerebrovascular accident) ([place])   DVT prophylaxis: ***   Code Status: ***   Family Communication: ***   Disposition Plan: ***   Consults called: ***   Admission status: ***     Bernadette Hoit MD [facility] Pager [phone]  If 7PM-7AM, please contact night-coverage [email or web address] Password [facility]  09/15/2020, 11:32 PM

## 2020-09-15 NOTE — ED Provider Notes (Signed)
[facility] EMERGENCY DEPARTMENT Provider Note   CSN: [id number] Arrival date & time: 09/15/20  1948     History Chief Complaint  Patient presents with  . Loss of Consciousness    Pt in kitchen at home, became dizzy, had syncopal episode and hit right side of head.  . Abrasion    Right upper arm skin tear     Virginia Young is a 80 y.o. female.  HPI   This patient is an 80 year old female with a history of atrial fibrillation, she currently take medications including Seroquel at night, Namenda, losartan, Eliquis, Depakote and has been diagnosed with some level of dementia.  The patient was reportedly at home, she was walking with her walker in the kitchen, she had a syncopal event which she does not remember and found herself on the floor with an injury to her right arm.  She denied having any chest pain or shortness of breath denies any headaches, states that she is right-handed, she does remember having a distant history of an arm fracture in the past but reports this has not limited her ability to do things.  She denies any blurred vision, dysuria, she states that she has some urinary frequency related to the medications that she takes, there has been no recent diarrhea.  Paramedics treated the patient with a dressing on her arm skin tear.  They brought her in for evaluation of syncope.  This was acute in onset, occurred just prior to arrival, there was no associated cardiac findings clinically or on objective cardiac monitoring per paramedics  Past Medical History:  Diagnosis Date  . Allergy   . Atrial fibrillation (HCC)    coumadin  . Cataract   . Chest pain 03/2012   a. Lex MV 10/13:  EF 64%, dist ant and apical defect sugg of soft tissue atten, no ischemia  . Chronic systolic heart failure ([place])   . Depression   . GERD (gastroesophageal reflux disease)   . HLD (hyperlipidemia)   . Mild mitral regurgitation   . MS (multiple sclerosis) ([place])   . NICM (nonischemic  cardiomyopathy) ([place])    a. neg CLite in 2003;  b. EF 40-45% in past;   c.  Echo 12/11: EF 35-40%, mild MR, mild LAE, mild RAE, small pericardial effusion   . Osteoarthritis   . Osteoporosis   . Stasis ulcer ([place])   . Vaginal prolapse without uterine prolapse   . Varicose veins     Patient Active Problem List   Diagnosis Date Noted  . Pyometra due to chronic inflammatory disease of uterus 02/18/2020  . Postmenopausal 08/11/2019  . Vitamin D deficiency 02/17/2019  . Constipation 02/17/2019  . Witnessed seizure-like activity ([place]) 02/03/2019  . Chronic systolic HF (heart failure) ([place]) 10/08/2018  . Educated about COVID-19 virus infection 10/08/2018  . Dementia with behavioral disturbance ([place]) 02/10/2018  . S/P shoulder replacement, right 08/22/2017  . HTN (hypertension) 02/19/2016  . Voiding dysfunction 10/07/2014  . SUI (stress urinary incontinence, female) 10/07/2014  . Osteoporosis with fracture 04/06/2014  . DOE (dyspnea on exertion) 07/23/2013  . Helicobacter positive gastritis 06/21/2013  . Orthostatic hypotension 06/10/2013  . Neck mass 02/19/2013  . Right groin mass 11/08/2011  . Low back pain 11/08/2011  . Lipoma of neck 06/21/2011  . OVERACTIVE BLADDER 08/21/2010  . Long term current use of anticoagulant 07/25/2010  . Osteoarthritis 01/18/2010  . KNEE PAIN 01/18/2010  . FOOT PAIN 01/18/2010  . Mixed hyperlipidemia 04/20/2009  . GERD  04/17/2009  . MITRAL REGURGITATION, MILD 08/30/2008  . NICM (nonischemic cardiomyopathy) ([place]) 08/30/2008  . Depression, major, single episode, moderate ([place]) 04/10/2007  . DISEASE, MITRAL VALVE NEC/NOS 04/10/2007  . Atrial fibrillation ([place]) 04/10/2007  . STASIS ULCER 04/10/2007  . VENOUS INSUFFICIENCY, LEGS 04/10/2007    Past Surgical History:  Procedure Laterality Date  . ANTERIOR AND POSTERIOR VAGINAL REPAIR W/ SACROSPINOUS LIGAMENT SUSPENSION  2016   [facility]  . BLADDER SURGERY    . CARPAL TUNNEL RELEASE Right 08/22/2017    Procedure: CARPAL TUNNEL RELEASE;  Surgeon: [name], MD;  Location: [place];  Service: Orthopedics;  Laterality: Right;  . cataract surgery Bilateral   . EYE SURGERY    . INGUINAL HERNIA REPAIR     right  . LIPOMA EXCISION     h/o removed from upper back x 2  . REVERSE SHOULDER ARTHROPLASTY Right 08/22/2017   Procedure: REVERSE SHOULDER ARTHROPLASTY;  Surgeon: [name], MD;  Location: [place];  Service: Orthopedics;  Laterality: Right;     OB History    Gravida      Para      Term      Preterm      AB      Living  3     SAB      IAB      Ectopic      Multiple      Live Births              Family History  Problem Relation Age of Onset  . Diabetes Father 91       diabetes and syncope  . Heart attack Father   . Heart attack Sister   . Heart attack Brother        all 5 brothers had MIs  . Stroke Brother   . Cancer Sister        bone  . Diabetes Sister   . Cancer Sister   . Diabetes Sister   . Heart attack Brother   . Dementia Mother   . Dementia Brother   . CAD Other   . Diabetes Daughter   . Colon cancer Neg Hx   . Neuropathy Neg Hx     Social History   Tobacco Use  . Smoking status: Never Smoker  . Smokeless tobacco: Never Used  Vaping Use  . Vaping Use: Never used  Substance Use Topics  . Alcohol use: No  . Drug use: No    Home Medications Prior to Admission medications   Medication Sig Start Date End Date Taking? Authorizing Provider  amoxicillin-clavulanate (AUGMENTIN) 500-125 MG tablet Take 1 tablet by mouth in the morning and at bedtime.   Yes [provider]  [employer] D3 25 MCG (1000 UT) capsule TAKE 1 CAPSULE (1,000 UNITS TOTAL) BY MOUTH DAILY. 09/27/19  Yes Rakes, Connye Burkitt, FNP  diclofenac Sodium (VOLTAREN) 1 % GEL Apply 2 g topically daily as needed (For pain).   Yes [provider]  divalproex (DEPAKOTE ER) 250 MG 24 hr tablet Take 1 tablet (250 mg total) by mouth daily. 12/22/19  Yes Lomax, Amy, NP  ELIQUIS 5 MG  TABS tablet Take 1 tablet (5 mg total) by mouth every 12 (twelve) hours. 08/17/20  Yes Hawks, Christy A, FNP  furosemide (LASIX) 20 MG tablet Take 3 tablets (60 mg total) by mouth daily. 11/10/19  Yes Minus Breeding, MD  Incontinence Supply Disposable (SELECT DISPOSABLE UNDERWEAR LG) MISC 1 application by Does not apply route  in the morning and at bedtime. 08/22/20  Yes Hawks, Christy A, FNP  ketoconazole (NIZORAL) 2 % cream Apply 1 application topically daily. 08/11/20  Yes Hawks, Theador Hawthorne, FNP  linaclotide (LINZESS) 72 MCG capsule Take 1 capsule (72 mcg total) by mouth daily before breakfast. 01/19/20  Yes Hawks, Christy A, FNP  losartan (COZAAR) 25 MG tablet TAKE 1/2 TABLET BY MOUTH EVERY DAY Patient taking differently: Take 12.5 mg by mouth daily. 09/21/19  Yes Minus Breeding, MD  memantine (NAMENDA) 5 MG tablet Take 1 tablet (5 mg total) by mouth 2 (two) times daily. 11/24/19  Yes Lomax, Amy, NP  metoprolol tartrate (LOPRESSOR) 25 MG tablet Take 1 tablet (25 mg total) by mouth 2 (two) times daily. 09/06/19 01/12/20 Yes Minus Breeding, MD  Multiple Vitamin (MULTIVITAMIN) tablet Take 1 tablet by mouth daily.   Yes [provider]  nystatin (MYCOSTATIN/NYSTOP) powder Apply 1 application topically 3 (three) times daily. 08/11/20  Yes Hawks, Christy A, FNP  ondansetron (ZOFRAN) 4 MG tablet Take 1 tablet (4 mg total) by mouth every 8 (eight) hours as needed for nausea or vomiting. 09/12/20  Yes Evelina Dun A, FNP  OXYGEN Inhale into the lungs. 2 liters PRN - anytime when needed ( cardio rx'd)   Yes [provider]  QUEtiapine (SEROQUEL) 200 MG tablet Take 1 tablet (200 mg total) by mouth at bedtime. Patient taking differently: Take 200 mg by mouth daily. 09/12/20  Yes Hawks, Christy A, FNP  traMADol (ULTRAM) 50 MG tablet Take 1-2 tablets (50-100 mg total) by mouth every 12 (twelve) hours as needed. 09/05/20   Sharion Balloon, FNP    Allergies    Bactrim [sulfamethoxazole-trimethoprim],  Lipitor [atorvastatin calcium], and Lisinopril  Review of Systems   Review of Systems  All other systems reviewed and are negative.   Physical Exam Updated Vital Signs BP 128/90   Pulse 75   Temp 98.2 F (36.8 C) (Oral)   Resp 17   Ht 1.676 m (5\' 6" )   Wt 100.7 kg   SpO2 100%   BMI 35.83 kg/m   Physical Exam Vitals and nursing note reviewed.  Constitutional:      General: She is not in acute distress.    Appearance: She is well-developed.  HENT:     Head: Normocephalic and atraumatic.     Comments: No signs of trauma about the head or neck    Nose: Congestion present.     Mouth/Throat:     Mouth: Mucous membranes are moist.     Pharynx: No oropharyngeal exudate.     Comments: Dentition is intact, no oral lesions or bleeding or tongue biting Eyes:     General: No scleral icterus.       Right eye: No discharge.        Left eye: No discharge.     Conjunctiva/sclera: Conjunctivae normal.     Pupils: Pupils are equal, round, and reactive to light.  Neck:     Thyroid: No thyromegaly.     Vascular: No JVD.  Cardiovascular:     Rate and Rhythm: Normal rate. Rhythm irregular.     Heart sounds: Normal heart sounds. No murmur heard. No friction rub. No gallop.      Comments: Slightly irregular Pulmonary:     Effort: Pulmonary effort is normal. No respiratory distress.     Breath sounds: Normal breath sounds. No wheezing or rales.  Abdominal:     General: Bowel sounds are normal. There is no  distension.     Palpations: Abdomen is soft. There is no mass.     Tenderness: There is no abdominal tenderness.  Musculoskeletal:        General: Tenderness present. Normal range of motion.     Cervical back: Normal range of motion and neck supple.     Comments: There is some tenderness with range of motion of the right hip and the right knee.  She is able to straight leg raise bilaterally and there is no leg length discrepancies.  With passive range of motion there is minimal pain  of the right leg.  There is no obvious injury to the joints, she has bilateral mild swelling of the knees with known osteoarthritis.  Upper extremities are symmetrical in appearance however the right upper extremity has some deformity of the lower anterior arm above the elbow with a skin tear, this is extremely superficial, there is no deep or repairable lacerations, some of the skin has a avulsed that is gone, there is no active bleeding  Lymphadenopathy:     Cervical: No cervical adenopathy.  Skin:    General: Skin is warm and dry.     Findings: No erythema or rash.  Neurological:     Mental Status: She is alert.     Coordination: Coordination normal.     Comments: The patient is awake alert and able to follow commands, she moves all 4 extremities but seems to be weaker with the right upper extremity and has a slight difficulty with dysmetria of the right upper extremity compared to the left.  Her grip is much stronger on the left, she has decreased sensation in the right face right arm and right leg compared to the left side  Psychiatric:        Behavior: Behavior normal.     ED Results / Procedures / Treatments   Labs (all labs ordered are listed, but only abnormal results are displayed) Labs Reviewed  PROTIME-INR - Abnormal; Notable for the following components:      Result Value   Prothrombin Time 18.2 (*)    INR 1.6 (*)    All other components within normal limits  DIFFERENTIAL - Abnormal; Notable for the following components:   Abs Immature Granulocytes 0.08 (*)    All other components within normal limits  COMPREHENSIVE METABOLIC PANEL - Abnormal; Notable for the following components:   Potassium 3.3 (*)    Glucose, Bld 125 (*)    Creatinine, Ser 1.32 (*)    Calcium 8.1 (*)    Total Protein 6.1 (*)    Albumin 3.1 (*)    GFR, Estimated 41 (*)    All other components within normal limits  VALPROIC ACID LEVEL - Abnormal; Notable for the following components:   Valproic Acid  Lvl 25 (*)    All other components within normal limits  RESP PANEL BY RT-PCR (FLU A&B, COVID) ARPGX2  ETHANOL  APTT  CBC  RAPID URINE DRUG SCREEN, HOSP PERFORMED  URINALYSIS, ROUTINE W REFLEX MICROSCOPIC  I-STAT CHEM 8, ED    EKG EKG Interpretation  Date/Time:  Friday September 15 2020 20:22:09 EDT Ventricular Rate:  72 PR Interval:    QRS Duration: 81 QT Interval:  387 QTC Calculation: 424 R Axis:   -23 Text Interpretation: Atrial fibrillation Inferior infarct, old since last tracing no significant change Confirmed by Noemi Chapel [phone]) on 09/15/2020 8:50:28 PM   Radiology CT HEAD WO CONTRAST  Result Date: 09/15/2020 CLINICAL DATA:  Neuro  deficit, stroke suspected, right-sided numbness. EXAM: CT HEAD WITHOUT CONTRAST TECHNIQUE: Contiguous axial images were obtained from the base of the skull through the vertex without intravenous contrast. COMPARISON:  MRI brain February 27, 2018 and head CT July 25, 2016 FINDINGS: Brain: No evidence of acute large vascular territory infarction, hemorrhage, hydrocephalus, extra-axial collection or mass lesion/mass effect. Age related global parenchymal volume loss with ex vacuo dilatation of ventricular system, not significantly changed from prior. Similar mild to moderate burden of chronic small vessel white matter ischemic disease. Vascular: No hyperdense vessel. Atherosclerotic calcifications of the intracranial portions of the internal carotid arteries. Skull: Normal. Negative for fracture or focal lesion. Sinuses/Orbits: Paranasal sinuses are predominantly clear. Orbits are unremarkable. Other: Streak artifact from dental hardware. Cerumen in the left external auditory canal. IMPRESSION: 1. No acute intracranial abnormality. 2. Similar age-related global parenchymal volume loss and chronic small vessel white matter ischemic disease. Electronically Signed   By: Dahlia Bailiff MD   On: 09/15/2020 20:58   DG Knee Complete 4 Views Right  Result  Date: 09/15/2020 CLINICAL DATA:  Pain after fall. EXAM: RIGHT KNEE - COMPLETE 4+ VIEW COMPARISON:  None. FINDINGS: No evidence of fracture, dislocation, or joint effusion. Tricompartment degenerative change of the knee most advanced in the medial compartment where it appears moderate to severe. Extensive vascular calcifications. IMPRESSION: 1. No acute osseous abnormality. 2. Tricompartment degenerative change of the knee, most advanced in the medial compartment. Electronically Signed   By: Dahlia Bailiff MD   On: 09/15/2020 21:24   DG Humerus Right  Result Date: 09/15/2020 CLINICAL DATA:  Arm pain after fall today. EXAM: RIGHT HUMERUS - 2+ VIEW COMPARISON:  Radiograph December 23, 2017 and August 05, 2018 FINDINGS: Reverse right total shoulder arthroplasty. There are well corticated osseous fragments along the inferior aspect of the humeral head which were not definitely visualized on chest radiograph August 05, 2018. There is no evidence of image shaft or distal humeral fracture. Degenerative change at the [facility] joint with inferior acromial spurring. Soft tissues are unremarkable. IMPRESSION: Well corticated osseous fragments along the inferior aspect of the humeral head which were not definitely visualized on chest radiograph August 05, 2018. Findings which may represent heterotopic ossification versus fracture fragments of uncertain chronicity. Recommend correlation with point tenderness and possible dedicated shoulder radiographs. Electronically Signed   By: Dahlia Bailiff MD   On: 09/15/2020 21:17   DG Hip Unilat W or Wo Pelvis 2-3 Views Right  Result Date: 09/15/2020 CLINICAL DATA:  Pain after fall. EXAM: DG HIP (WITH OR WITHOUT PELVIS) 2-3V RIGHT COMPARISON:  Radiograph June 05, 2017. FINDINGS: There is no evidence of hip fracture or dislocation. Mild degenerative change of the hips lower lumbar spine and pubic symphysis . IMPRESSION: No fracture or dislocation of the right hip. If there is high  clinical suspicion for occult fracture or the patient refuses to weightbear, consider further evaluation with MRI or CT. Electronically Signed   By: Dahlia Bailiff MD   On: 09/15/2020 21:24    Procedures Procedures   Medications Ordered in ED Medications  Tdap (BOOSTRIX) injection 0.5 mL (0.5 mLs Intramuscular Patient Refused/Not Given 09/15/20 2118)  bacitracin ointment 1 application (1 application Topical Given 09/15/20 2119)    ED Course  I have reviewed the triage vital signs and the nursing notes.  Pertinent labs & imaging results that were available during my care of the patient were reviewed by me and considered in my medical decision making (see chart for  details).  Clinical Course as of 09/15/20 2329  Ludwig Clarks Sep 15, 2020  2120 I have looked at the x-rays of the patient's hip and humerus, there is no signs of acute fractures, there is a previous shoulder replacement in the right arm which appears to be intact [BM]  2121 X-ray of the knee shows no obvious fractures of the tibial plateau or distal femur, there does not appear to be any fibular fractures however there is severe medial compartment arthritis and severe calcifications of the arteries of the leg seen on x-ray [BM]    Clinical Course User Index [BM] Noemi Chapel, MD   MDM Rules/Calculators/A&P                          Unfortunately we are unable to gather from the patient's history that she gives [place] verbally whether she has new neurologic symptoms or not.  On one hand she states that she has had a prior injury to her right upper extremity with multiple fractures and has had symptoms for a long time but cannot tell me exactly what the symptoms are.  This would not account for decreased sensation in the right face, dysmetria of the right arm or decrease sensation in the right leg.  We will consult with neurology, the patient will go for a CT scan, she is already on Eliquis, she is not a TPA candidate for that reason and the  inability to get a timing on the history of symptoms.  The syncopal event may or may not have been related to that, we will place her on cardiac monitoring, she is not tachycardic at this time  D/w Dr. Reola Calkins who will admit Teleneuro - agreeable that pt needs admission  Final Clinical Impression(s) / ED Diagnoses Final diagnoses:  Syncope, unspecified syncope type  Pain and numbness of right upper extremity  Skin tear of right upper arm without complication, initial encounter      Noemi Chapel, MD 09/15/20 2329

## 2020-09-16 ENCOUNTER — Inpatient Hospital Stay (HOSPITAL_COMMUNITY): Payer: Medicare Other

## 2020-09-16 ENCOUNTER — Observation Stay (HOSPITAL_COMMUNITY): Payer: Medicare Other

## 2020-09-16 DIAGNOSIS — E8809 Other disorders of plasma-protein metabolism, not elsewhere classified: Secondary | ICD-10-CM

## 2020-09-16 DIAGNOSIS — I6389 Other cerebral infarction: Secondary | ICD-10-CM

## 2020-09-16 DIAGNOSIS — R55 Syncope and collapse: Secondary | ICD-10-CM | POA: Diagnosis not present

## 2020-09-16 DIAGNOSIS — W19XXXA Unspecified fall, initial encounter: Secondary | ICD-10-CM

## 2020-09-16 DIAGNOSIS — E441 Mild protein-calorie malnutrition: Secondary | ICD-10-CM | POA: Diagnosis present

## 2020-09-16 DIAGNOSIS — H8113 Benign paroxysmal vertigo, bilateral: Secondary | ICD-10-CM | POA: Diagnosis present

## 2020-09-16 DIAGNOSIS — F0390 Unspecified dementia without behavioral disturbance: Secondary | ICD-10-CM | POA: Diagnosis present

## 2020-09-16 DIAGNOSIS — E669 Obesity, unspecified: Secondary | ICD-10-CM | POA: Diagnosis present

## 2020-09-16 DIAGNOSIS — Y9301 Activity, walking, marching and hiking: Secondary | ICD-10-CM | POA: Diagnosis present

## 2020-09-16 DIAGNOSIS — E876 Hypokalemia: Secondary | ICD-10-CM

## 2020-09-16 DIAGNOSIS — Z20822 Contact with and (suspected) exposure to covid-19: Secondary | ICD-10-CM | POA: Diagnosis present

## 2020-09-16 DIAGNOSIS — I480 Paroxysmal atrial fibrillation: Secondary | ICD-10-CM | POA: Diagnosis present

## 2020-09-16 DIAGNOSIS — E222 Syndrome of inappropriate secretion of antidiuretic hormone: Secondary | ICD-10-CM | POA: Diagnosis present

## 2020-09-16 DIAGNOSIS — I5022 Chronic systolic (congestive) heart failure: Secondary | ICD-10-CM | POA: Diagnosis present

## 2020-09-16 DIAGNOSIS — G319 Degenerative disease of nervous system, unspecified: Secondary | ICD-10-CM | POA: Diagnosis not present

## 2020-09-16 DIAGNOSIS — E782 Mixed hyperlipidemia: Secondary | ICD-10-CM | POA: Diagnosis present

## 2020-09-16 DIAGNOSIS — S41111A Laceration without foreign body of right upper arm, initial encounter: Secondary | ICD-10-CM | POA: Diagnosis present

## 2020-09-16 DIAGNOSIS — I4892 Unspecified atrial flutter: Secondary | ICD-10-CM | POA: Diagnosis present

## 2020-09-16 DIAGNOSIS — G35 Multiple sclerosis: Secondary | ICD-10-CM | POA: Diagnosis present

## 2020-09-16 DIAGNOSIS — I639 Cerebral infarction, unspecified: Secondary | ICD-10-CM | POA: Diagnosis present

## 2020-09-16 DIAGNOSIS — M810 Age-related osteoporosis without current pathological fracture: Secondary | ICD-10-CM | POA: Diagnosis present

## 2020-09-16 DIAGNOSIS — S40811A Abrasion of right upper arm, initial encounter: Secondary | ICD-10-CM

## 2020-09-16 DIAGNOSIS — I4821 Permanent atrial fibrillation: Secondary | ICD-10-CM | POA: Diagnosis not present

## 2020-09-16 DIAGNOSIS — Z96611 Presence of right artificial shoulder joint: Secondary | ICD-10-CM | POA: Diagnosis present

## 2020-09-16 DIAGNOSIS — I110 Hypertensive heart disease with heart failure: Secondary | ICD-10-CM | POA: Diagnosis present

## 2020-09-16 DIAGNOSIS — I429 Cardiomyopathy, unspecified: Secondary | ICD-10-CM | POA: Diagnosis present

## 2020-09-16 DIAGNOSIS — I10 Essential (primary) hypertension: Secondary | ICD-10-CM | POA: Diagnosis not present

## 2020-09-16 DIAGNOSIS — I482 Chronic atrial fibrillation, unspecified: Secondary | ICD-10-CM | POA: Diagnosis present

## 2020-09-16 DIAGNOSIS — E861 Hypovolemia: Secondary | ICD-10-CM | POA: Diagnosis present

## 2020-09-16 DIAGNOSIS — K219 Gastro-esophageal reflux disease without esophagitis: Secondary | ICD-10-CM | POA: Diagnosis present

## 2020-09-16 DIAGNOSIS — N390 Urinary tract infection, site not specified: Secondary | ICD-10-CM | POA: Diagnosis present

## 2020-09-16 DIAGNOSIS — R531 Weakness: Secondary | ICD-10-CM

## 2020-09-16 DIAGNOSIS — I951 Orthostatic hypotension: Secondary | ICD-10-CM | POA: Diagnosis present

## 2020-09-16 DIAGNOSIS — F32A Depression, unspecified: Secondary | ICD-10-CM | POA: Diagnosis present

## 2020-09-16 LAB — CBC
HCT: 44.4 % (ref 36.0–46.0)
Hemoglobin: 14.5 g/dL (ref 12.0–15.0)
MCH: 29.7 pg (ref 26.0–34.0)
MCHC: 32.7 g/dL (ref 30.0–36.0)
MCV: 90.8 fL (ref 80.0–100.0)
Platelets: 224 10*3/uL (ref 150–400)
RBC: 4.89 MIL/uL (ref 3.87–5.11)
RDW: 13.9 % (ref 11.5–15.5)
WBC: 9.1 10*3/uL (ref 4.0–10.5)
nRBC: 0 % (ref 0.0–0.2)

## 2020-09-16 LAB — ECHOCARDIOGRAM COMPLETE
Calc EF: 55.2 %
Height: 66 in
S' Lateral: 3.7 cm
Single Plane A2C EF: 47.7 %
Single Plane A4C EF: 57.2 %
Weight: 3463.87 oz

## 2020-09-16 LAB — APTT: aPTT: 32 seconds (ref 24–36)

## 2020-09-16 LAB — PROTIME-INR
INR: 1.5 — ABNORMAL HIGH (ref 0.8–1.2)
Prothrombin Time: 17.3 seconds — ABNORMAL HIGH (ref 11.4–15.2)

## 2020-09-16 LAB — COMPREHENSIVE METABOLIC PANEL
ALT: 22 U/L (ref 0–44)
AST: 25 U/L (ref 15–41)
Albumin: 3.1 g/dL — ABNORMAL LOW (ref 3.5–5.0)
Alkaline Phosphatase: 65 U/L (ref 38–126)
Anion gap: 8 (ref 5–15)
BUN: 16 mg/dL (ref 8–23)
CO2: 32 mmol/L (ref 22–32)
Calcium: 8.5 mg/dL — ABNORMAL LOW (ref 8.9–10.3)
Chloride: 98 mmol/L (ref 98–111)
Creatinine, Ser: 1.22 mg/dL — ABNORMAL HIGH (ref 0.44–1.00)
GFR, Estimated: 45 mL/min — ABNORMAL LOW (ref >60)
Glucose, Bld: 98 mg/dL (ref 70–99)
Potassium: 3.1 mmol/L — ABNORMAL LOW (ref 3.5–5.1)
Sodium: 138 mmol/L (ref 135–145)
Total Bilirubin: 0.8 mg/dL (ref 0.3–1.2)
Total Protein: 5.9 g/dL — ABNORMAL LOW (ref 6.5–8.1)

## 2020-09-16 LAB — URINALYSIS, ROUTINE W REFLEX MICROSCOPIC
Bilirubin Urine: NEGATIVE
Glucose, UA: NEGATIVE mg/dL
Ketones, ur: 5 mg/dL — AB
Nitrite: NEGATIVE
Protein, ur: NEGATIVE mg/dL
Specific Gravity, Urine: 1.009 (ref 1.005–1.030)
WBC, UA: >50 WBC/hpf — ABNORMAL HIGH (ref 0–5)
pH: 7 (ref 5.0–8.0)

## 2020-09-16 LAB — LIPID PANEL
Cholesterol: 152 mg/dL (ref 0–200)
HDL: 40 mg/dL — ABNORMAL LOW (ref >40)
LDL Cholesterol: 96 mg/dL (ref 0–99)
Total CHOL/HDL Ratio: 3.8 RATIO
Triglycerides: 78 mg/dL (ref <150)
VLDL: 16 mg/dL (ref 0–40)

## 2020-09-16 LAB — RAPID URINE DRUG SCREEN, HOSP PERFORMED
Amphetamines: NOT DETECTED
Barbiturates: NOT DETECTED
Benzodiazepines: NOT DETECTED
Cocaine: NOT DETECTED
Opiates: NOT DETECTED
Tetrahydrocannabinol: NOT DETECTED

## 2020-09-16 LAB — MAGNESIUM: Magnesium: 2.1 mg/dL (ref 1.7–2.4)

## 2020-09-16 LAB — HEMOGLOBIN A1C
Hgb A1c MFr Bld: 6.4 % — ABNORMAL HIGH (ref 4.8–5.6)
Mean Plasma Glucose: 136.98 mg/dL

## 2020-09-16 LAB — PHOSPHORUS: Phosphorus: 3.1 mg/dL (ref 2.5–4.6)

## 2020-09-16 MED ORDER — CEFTAZIDIME 1 G IJ SOLR
1.0000 g | Freq: Two times a day (BID) | INTRAMUSCULAR | Status: DC
  Administered 2020-09-16 (×2): 1 g via INTRAMUSCULAR
  Filled 2020-09-16 (×3): qty 1

## 2020-09-16 MED ORDER — SODIUM CHLORIDE 0.9 % IV SOLN: INTRAVENOUS | Status: DC

## 2020-09-16 MED ORDER — ONDANSETRON HCL 4 MG PO TABS
4.0000 mg | ORAL_TABLET | Freq: Three times a day (TID) | ORAL | Status: DC | PRN
  Administered 2020-09-18 – 2020-09-21 (×6): 4 mg via ORAL
  Filled 2020-09-16 (×7): qty 1

## 2020-09-16 MED ORDER — MEMANTINE HCL 10 MG PO TABS
5.0000 mg | ORAL_TABLET | Freq: Two times a day (BID) | ORAL | Status: DC
  Administered 2020-09-16 – 2020-09-23 (×15): 5 mg via ORAL
  Filled 2020-09-16 (×15): qty 1

## 2020-09-16 MED ORDER — POTASSIUM CHLORIDE CRYS ER 20 MEQ PO TBCR
40.0000 meq | EXTENDED_RELEASE_TABLET | Freq: Two times a day (BID) | ORAL | Status: AC
  Administered 2020-09-16 (×2): 40 meq via ORAL
  Filled 2020-09-16 (×2): qty 4
  Filled 2020-09-16: qty 2

## 2020-09-16 MED ORDER — QUETIAPINE FUMARATE 25 MG PO TABS
25.0000 mg | ORAL_TABLET | Freq: Every day | ORAL | Status: DC
  Administered 2020-09-16 – 2020-09-22 (×7): 25 mg via ORAL
  Filled 2020-09-16 (×7): qty 1

## 2020-09-16 MED ORDER — METOPROLOL TARTRATE 25 MG PO TABS
25.0000 mg | ORAL_TABLET | Freq: Two times a day (BID) | ORAL | Status: DC
  Administered 2020-09-16 – 2020-09-20 (×9): 25 mg via ORAL
  Filled 2020-09-16 (×9): qty 1

## 2020-09-16 MED ORDER — APIXABAN 5 MG PO TABS
5.0000 mg | ORAL_TABLET | Freq: Two times a day (BID) | ORAL | Status: DC
  Administered 2020-09-16 – 2020-09-23 (×15): 5 mg via ORAL
  Filled 2020-09-16 (×15): qty 1

## 2020-09-16 MED ORDER — GADOBUTROL 1 MMOL/ML IV SOLN
10.0000 mL | Freq: Once | INTRAVENOUS | Status: AC | PRN
  Administered 2020-09-16: 10 mL via INTRAVENOUS

## 2020-09-16 MED ORDER — POTASSIUM CHLORIDE 10 MEQ/100ML IV SOLN
10.0000 meq | INTRAVENOUS | Status: DC
  Administered 2020-09-16: 10 meq via INTRAVENOUS
  Filled 2020-09-16: qty 100

## 2020-09-16 MED ORDER — FUROSEMIDE 40 MG PO TABS: 60.0000 mg | ORAL_TABLET | Freq: Every day | ORAL | Status: DC

## 2020-09-16 MED ORDER — DIVALPROEX SODIUM ER 250 MG PO TB24
250.0000 mg | ORAL_TABLET | Freq: Every day | ORAL | Status: DC
  Administered 2020-09-16 – 2020-09-23 (×8): 250 mg via ORAL
  Filled 2020-09-16 (×8): qty 1

## 2020-09-16 MED ORDER — DULOXETINE HCL 30 MG PO CPEP
30.0000 mg | ORAL_CAPSULE | Freq: Every day | ORAL | Status: DC
  Administered 2020-09-16 – 2020-09-23 (×8): 30 mg via ORAL
  Filled 2020-09-16 (×8): qty 1

## 2020-09-16 MED ORDER — SODIUM CHLORIDE 0.9 % IV BOLUS: 1000.0000 mL | Freq: Once | INTRAVENOUS | Status: DC

## 2020-09-16 MED ORDER — SODIUM CHLORIDE 0.9 % IV SOLN: INTRAVENOUS | Status: AC

## 2020-09-16 MED ORDER — SODIUM CHLORIDE 0.9 % IV BOLUS
500.0000 mL | Freq: Once | INTRAVENOUS | Status: AC
  Administered 2020-09-16: 500 mL via INTRAVENOUS

## 2020-09-16 MED ORDER — MAGNESIUM SULFATE 2 GM/50ML IV SOLN
2.0000 g | Freq: Once | INTRAVENOUS | Status: AC
  Administered 2020-09-16: 2 g via INTRAVENOUS
  Filled 2020-09-16: qty 50

## 2020-09-16 MED ORDER — DULOXETINE HCL 30 MG PO CPEP
30.0000 mg | ORAL_CAPSULE | Freq: Two times a day (BID) | ORAL | Status: DC
Start: 2020-09-30 — End: 2020-09-23

## 2020-09-16 MED ORDER — SODIUM CHLORIDE 0.9 % IV SOLN
1.0000 g | Freq: Two times a day (BID) | INTRAVENOUS | Status: DC
  Administered 2020-09-17: 1 g via INTRAVENOUS
  Filled 2020-09-16 (×3): qty 1

## 2020-09-16 MED ORDER — QUETIAPINE FUMARATE 200 MG PO TABS
200.0000 mg | ORAL_TABLET | Freq: Every day | ORAL | Status: DC
Start: 2020-09-16 — End: 2020-09-16

## 2020-09-16 MED ORDER — AMOXICILLIN-POT CLAVULANATE 500-125 MG PO TABS
1.0000 | ORAL_TABLET | Freq: Three times a day (TID) | ORAL | Status: DC
  Filled 2020-09-16: qty 1

## 2020-09-16 MED ORDER — TRAMADOL HCL 50 MG PO TABS
50.0000 mg | ORAL_TABLET | Freq: Two times a day (BID) | ORAL | Status: DC | PRN
  Administered 2020-09-17 – 2020-09-22 (×3): 50 mg via ORAL
  Filled 2020-09-16 (×3): qty 1

## 2020-09-16 MED ORDER — PERFLUTREN LIPID MICROSPHERE
1.0000 mL | INTRAVENOUS | Status: AC | PRN
  Administered 2020-09-16: 2 mL via INTRAVENOUS
  Filled 2020-09-16: qty 10

## 2020-09-16 MED ORDER — LINACLOTIDE 72 MCG PO CAPS
72.0000 ug | ORAL_CAPSULE | Freq: Every day | ORAL | Status: DC
  Administered 2020-09-17 – 2020-09-23 (×7): 72 ug via ORAL
  Filled 2020-09-16 (×7): qty 1

## 2020-09-16 NOTE — Evaluation (Signed)
Physical Therapy Evaluation Patient Details Name: Virginia Young MRN: [id number] DOB: 11-29-1940 Today's Date: 09/16/2020   History of Present Illness  80 y/o female presented to ED on 3/25 due to lightheadedness and syncopal episode. Head CT and x-rays were all negative. PMH: Afib on Eliquis, HTN, mild dementia, CHF, GERD, HLD, MS, recurrent UTIs  Clinical Impression  PTA, patient lives alone but has an aide 3x/week to assist with ADLs and iADLs. Patient ambulates with rollator at baseline. Patient overall min guard for safety as +orthostatics, see below. Patient symptomatic with transitions and reports worsening dizziness upon standing and noted SOB after 1 minute of standing. Patient presents with generalized weakness, impaired balance, decreased activity tolerance. Patient will benefit from skilled PT services during acute stay to address listed deficits. Recommend HHPT and 24/7 supervision/assistance when returning home. If family unable to provide 24/7 assistance/supervision, recommend SNF.   Orthostatic BPs  Supine 114/63 HR 74  Sitting 87/64 HR 87  Standing 71/58 HR 112  Standing after 2 min 79/54 HR 139  Sitting after transfer 106/60 HR 92       Follow Up Recommendations Home health PT;Supervision/Assistance - 24 hour    Equipment Recommendations  Rolling Mikolaj Woolstenhulme with 5" wheels    Recommendations for Other Services       Precautions / Restrictions Precautions Precautions: Fall Restrictions Weight Bearing Restrictions: No      Mobility  Bed Mobility Overal bed mobility: Needs Assistance Bed Mobility: Supine to Sit     Supine to sit: Min guard     General bed mobility comments: min guard for safety, no physical assistance required    Transfers Overall transfer level: Needs assistance Equipment used: Rolling Lissandro Dilorenzo (2 wheeled) Transfers: Sit to/from [facility] Sit to Stand: Min guard Stand pivot transfers: Min guard       General  transfer comment: min guard for safety, assessed orthostatics throughout transitions  Ambulation/Gait             General Gait Details: deferred due to +orthostatics and symptomatic with reports of dizziness  Stairs            Wheelchair Mobility    Modified Rankin (Stroke Patients Only)       Balance Overall balance assessment: Mild deficits observed, not formally tested                                           Pertinent Vitals/Pain Pain Assessment: Faces Faces Pain Scale: No hurt Pain Intervention(s): Monitored during session    Home Living Family/patient expects to be discharged to:: Private residence Living Arrangements: Alone Available Help at Discharge: Personal care attendant;Available PRN/intermittently;Family Type of Home: House Home Access: Ramped entrance     Home Layout: One level Home Equipment: [place] - 4 wheels;Bedside commode;Tub bench      Prior Function Level of Independence: Needs assistance   Gait / Transfers Assistance Needed: Ambulates with rollator  ADL's / Homemaking Assistance Needed: Aid assists with bathe/dress/ cook/clean 4 hours 2-3x/week  Comments: Saturdays- daughter takes her out     Hand Dominance        Extremity/Trunk Assessment   Upper Extremity Assessment Upper Extremity Assessment: Defer to OT evaluation    Lower Extremity Assessment Lower Extremity Assessment: Generalized weakness       Communication   Communication: No difficulties  Cognition Arousal/Alertness: Awake/alert Behavior During Therapy: [facility]  for tasks assessed/performed Overall Cognitive Status: Within Functional Limits for tasks assessed                                        General Comments General comments (skin integrity, edema, etc.): Pt with + orthostatics BP supine: 114/63, 74 BPM  sitting: 87/64,  87 BPM  standing: 71/58, 112 BPM  standing: 2 mins 79/54 139 BPM  sitting after exertion: 106/60,  92 BPM  sitting 73 BPM, O2 97% on RA.    Exercises     Assessment/Plan    PT Assessment Patient needs continued PT services  PT Problem List Decreased strength;Decreased activity tolerance;Decreased balance;Decreased mobility;Decreased safety awareness       PT Treatment Interventions DME instruction;Gait training;Functional mobility training;Therapeutic activities;Therapeutic exercise;Balance training;Patient/family education    PT Goals (Current goals can be found in the Care Plan section)  Acute Rehab PT Goals Patient Stated Goal: to go home PT Goal Formulation: With patient Time For Goal Achievement: 09/30/20 Potential to Achieve Goals: Good    Frequency Min 3X/week   Barriers to discharge        Co-evaluation               AM-PAC PT "6 Clicks" Mobility  Outcome Measure Help needed turning from your back to your side while in a flat bed without using bedrails?: A Little Help needed moving from lying on your back to sitting on the side of a flat bed without using bedrails?: A Little Help needed moving to and from a bed to a chair (including a wheelchair)?: A Little Help needed standing up from a chair using your arms (e.g., wheelchair or bedside chair)?: A Little Help needed to walk in hospital room?: A Little Help needed climbing 3-5 steps with a railing? : A Little 6 Click Score: 18    End of Session Equipment Utilized During Treatment: Gait belt Activity Tolerance: Patient tolerated treatment well Patient left: in chair;with call bell/phone within reach;with chair alarm set Nurse Communication: Mobility status PT Visit Diagnosis: Unsteadiness on feet (R26.81);Muscle weakness (generalized) (M62.81);History of falling (Z91.81)    Time: 1537-9432 PT Time Calculation (min) (ACUTE ONLY): 27 min   Charges:   PT Evaluation $PT Eval Moderate Complexity: 1 Mod          Janashia Parco A. Gilford Rile PT, DPT Acute Rehabilitation Services Pager [phone] Office  [phone]   Linna Hoff 09/16/2020, 1:41 PM

## 2020-09-16 NOTE — H&P (Addendum)
History and Physical  Virginia Young:[id number] DOB: 18-Dec-1940 DOA: 09/15/2020  Referring physician: Noemi Chapel, MD PCP: Sharion Balloon, FNP  Patient coming from: Home  Chief Complaint: Loss of consciousness  HPI: Virginia Young is a 80 y.o. female with medical history significant for A. fib on Eliquis, hypertension, dementia and obesity who presents to the emergency department via EMS due to lightheadedness and suspected syncope.  Patient states that she was walking with her walker in the kitchen around 6 PM when she suddenly felt dizzy and lightheaded and passed out falling forward and hitting her head on her walker, incident only lasted about a minute or less and she found herself on the floor with right arm injury.  EMS was activated, and on arrival of EMS, dressing was placed on right arm skin tear and patient was taken to the ED for syncope evaluation.  Patient lives at home alone and has an aide who comes in daily for 4-5 hours.  Apparently, patient was recently treated with Augmentin due to UTI as an outpatient.  Patient also complained of 1 to 2 weeks of right-sided weakness with increased effort in being able to ambulate this past week.  She denies chest pain, shortness of breath (though she uses oxygen occasionally at home as needed).  ED Course:  The emergency department, she was hemodynamically stable.  Work-up in the ED showed normal CBC, hypokalemia, BUN/creatinine 19/1.32 (baseline creatinine at 1.0-1.1).  Alcohol level < 10.  Urine culture was positive for Pseudomonas aeruginosa. Right knee x-ray showed no acute osseous abnormality Right hip x-ray showed no fracture or dislocation of the right hip Right humerus x-ray showed findings which were present heterotopic ossification versus fracture fragments of uncertain chronicity CT of head without contrast showed no acute intracranial abnormality Telemetry neurology was consulted and recommended further stroke work-up.   ED physician states that Teleneurologist agreed that patient could be [facility] to [facility] for [facility]/ECHO.  Hospitalist was asked to admit  Review of Systems: Constitutional: Negative for chills and fever.  HENT: Negative for ear pain and sore throat.   Eyes: Negative for pain and visual disturbance.  Respiratory: Negative for cough, chest tightness and shortness of breath.   Cardiovascular: Positive for syncope.  Negative for chest pain and palpitations.  Gastrointestinal: Negative for abdominal pain and vomiting.  Endocrine: Negative for polyphagia and polyuria.  Genitourinary: Negative for decreased urine volume, dysuria, enuresis, hematuria Musculoskeletal: Negative for arthralgias and back pain.  Skin: Negative for color change and rash.  Allergic/Immunologic: Negative for immunocompromised state.  Neurological: Positive for syncope and right-sided weakness.  Negative for tremors,  speech difficulty Hematological: Does not bruise/bleed easily.  All other systems reviewed and are negative   Past Medical History:  Diagnosis Date  . Allergy   . Atrial fibrillation (HCC)    coumadin  . Cataract   . Chest pain 03/2012   a. Lex MV 10/13:  EF 64%, dist ant and apical defect sugg of soft tissue atten, no ischemia  . Chronic systolic heart failure ([place])   . Depression   . GERD (gastroesophageal reflux disease)   . HLD (hyperlipidemia)   . Mild mitral regurgitation   . MS (multiple sclerosis) ([place])   . NICM (nonischemic cardiomyopathy) ([place])    a. neg CLite in 2003;  b. EF 40-45% in past;   c.  Echo 12/11: EF 35-40%, mild MR, mild LAE, mild RAE, small pericardial effusion   . Osteoarthritis   . Osteoporosis   .  Stasis ulcer ([place])   . Vaginal prolapse without uterine prolapse   . Varicose veins    Past Surgical History:  Procedure Laterality Date  . ANTERIOR AND POSTERIOR VAGINAL REPAIR W/ SACROSPINOUS LIGAMENT SUSPENSION  2016   [facility]  . BLADDER SURGERY    . CARPAL TUNNEL  RELEASE Right 08/22/2017   Procedure: CARPAL TUNNEL RELEASE;  Surgeon: [name], MD;  Location: [place];  Service: Orthopedics;  Laterality: Right;  . cataract surgery Bilateral   . EYE SURGERY    . INGUINAL HERNIA REPAIR     right  . LIPOMA EXCISION     h/o removed from upper back x 2  . REVERSE SHOULDER ARTHROPLASTY Right 08/22/2017   Procedure: REVERSE SHOULDER ARTHROPLASTY;  Surgeon: [name], MD;  Location: [place];  Service: Orthopedics;  Laterality: Right;    Social History:  reports that she has never smoked. She has never used smokeless tobacco. She reports that she does not drink alcohol and does not use drugs.   Allergies  Allergen Reactions  . Bactrim [Sulfamethoxazole-Trimethoprim]     Mouth sores  . Lipitor [Atorvastatin Calcium]     myalgia  . Lisinopril     cough    Family History  Problem Relation Age of Onset  . Diabetes Father 63       diabetes and syncope  . Heart attack Father   . Heart attack Sister   . Heart attack Brother        all 5 brothers had MIs  . Stroke Brother   . Cancer Sister        bone  . Diabetes Sister   . Cancer Sister   . Diabetes Sister   . Heart attack Brother   . Dementia Mother   . Dementia Brother   . CAD Other   . Diabetes Daughter   . Colon cancer Neg Hx   . Neuropathy Neg Hx      Prior to Admission medications   Medication Sig Start Date End Date Taking? Authorizing Provider  amoxicillin-clavulanate (AUGMENTIN) 500-125 MG tablet Take 1 tablet by mouth in the morning and at bedtime.   Yes [provider]  [employer] D3 25 MCG (1000 UT) capsule TAKE 1 CAPSULE (1,000 UNITS TOTAL) BY MOUTH DAILY. 09/27/19  Yes Rakes, Connye Burkitt, FNP  diclofenac Sodium (VOLTAREN) 1 % GEL Apply 2 g topically daily as needed (For pain).   Yes [provider]  divalproex (DEPAKOTE ER) 250 MG 24 hr tablet Take 1 tablet (250 mg total) by mouth daily. 12/22/19  Yes Lomax, Amy, NP  ELIQUIS 5 MG TABS tablet Take 1 tablet (5 mg total) by  mouth every 12 (twelve) hours. 08/17/20  Yes Hawks, Christy A, FNP  furosemide (LASIX) 20 MG tablet Take 3 tablets (60 mg total) by mouth daily. 11/10/19  Yes Minus Breeding, MD  Incontinence Supply Disposable (SELECT DISPOSABLE UNDERWEAR LG) MISC 1 application by Does not apply route in the morning and at bedtime. 08/22/20  Yes Hawks, Christy A, FNP  ketoconazole (NIZORAL) 2 % cream Apply 1 application topically daily. 08/11/20  Yes Hawks, Theador Hawthorne, FNP  linaclotide (LINZESS) 72 MCG capsule Take 1 capsule (72 mcg total) by mouth daily before breakfast. 01/19/20  Yes Hawks, Christy A, FNP  losartan (COZAAR) 25 MG tablet TAKE 1/2 TABLET BY MOUTH EVERY DAY Patient taking differently: Take 12.5 mg by mouth daily. 09/21/19  Yes Minus Breeding, MD  memantine (NAMENDA) 5 MG tablet Take 1 tablet (5  mg total) by mouth 2 (two) times daily. 11/24/19  Yes Lomax, Amy, NP  metoprolol tartrate (LOPRESSOR) 25 MG tablet Take 1 tablet (25 mg total) by mouth 2 (two) times daily. 09/06/19 01/12/20 Yes Minus Breeding, MD  Multiple Vitamin (MULTIVITAMIN) tablet Take 1 tablet by mouth daily.   Yes [provider]  nystatin (MYCOSTATIN/NYSTOP) powder Apply 1 application topically 3 (three) times daily. 08/11/20  Yes Hawks, Christy A, FNP  ondansetron (ZOFRAN) 4 MG tablet Take 1 tablet (4 mg total) by mouth every 8 (eight) hours as needed for nausea or vomiting. 09/12/20  Yes Evelina Dun A, FNP  OXYGEN Inhale into the lungs. 2 liters PRN - anytime when needed ( cardio rx'd)   Yes [provider]  QUEtiapine (SEROQUEL) 200 MG tablet Take 1 tablet (200 mg total) by mouth at bedtime. Patient taking differently: Take 200 mg by mouth daily. 09/12/20  Yes Hawks, Christy A, FNP  traMADol (ULTRAM) 50 MG tablet Take 1-2 tablets (50-100 mg total) by mouth every 12 (twelve) hours as needed. 09/05/20   Sharion Balloon, FNP    Physical Exam: BP (!) 159/107 (BP Location: Left Arm)   Pulse 87   Temp 98.1 F (36.7 C)  (Oral)   Resp 20   Ht 5\' 6"  (1.676 m)   Wt 98.2 kg   SpO2 100%   BMI 34.94 kg/m   . General: 80 y.o. year-old female well developed well nourished in no acute distress.  Alert and oriented x3. Marland Kitchen HEENT: NCAT, EOMI . Neck: Supple, trachea medial . Cardiovascular: Irregular rate and rhythm with no rubs or gallops.  No thyromegaly or JVD noted. 2/4 pulses in all 4 extremities. Marland Kitchen Respiratory: Clear to auscultation with no wheezes or rales. Good inspiratory effort. . Abdomen: Soft nontender nondistended with normal bowel sounds x4 quadrants. . Muskuloskeletal: Right arm abrasion.  No cyanosis, clubbing or edema noted bilaterally . Neuro: CN II-XII intact, strength 4/5 in RUE and RLE, 5/5 in LUE and LLL.  Sensation, reflexes intact . Skin: No ulcerative lesions noted or rashes . Psychiatry: Mood is appropriate for condition and setting          Labs on Admission:  Basic Metabolic Panel: Recent Labs  Lab 09/15/20 2038  NA 137  K 3.3*  CL 99  CO2 29  GLUCOSE 125*  BUN 19  CREATININE 1.32*  CALCIUM 8.1*   Liver Function Tests: Recent Labs  Lab 09/15/20 2038  AST 25  ALT 23  ALKPHOS 70  BILITOT 0.7  PROT 6.1*  ALBUMIN 3.1*   No results for input(s): LIPASE, AMYLASE in the last 168 hours. No results for input(s): AMMONIA in the last 168 hours. CBC: Recent Labs  Lab 09/12/20 1317 09/15/20 2038  WBC 9.7 9.8  NEUTROABS 6.0 6.5  HGB 15.2 13.8  HCT 45.8 42.9  MCV 90 92.1  PLT 294 225   Cardiac Enzymes: No results for input(s): CKTOTAL, CKMB, CKMBINDEX, TROPONINI in the last 168 hours.  BNP (last 3 results) No results for input(s): BNP in the last 8760 hours.  ProBNP (last 3 results) No results for input(s): PROBNP in the last 8760 hours.  CBG: No results for input(s): GLUCAP in the last 168 hours.  Radiological Exams on Admission: CT HEAD WO CONTRAST  Result Date: 09/15/2020 CLINICAL DATA:  Neuro deficit, stroke suspected, right-sided numbness. EXAM: CT HEAD  WITHOUT CONTRAST TECHNIQUE: Contiguous axial images were obtained from the base of the skull through the vertex without intravenous contrast.  COMPARISON:  MRI brain February 27, 2018 and head CT July 25, 2016 FINDINGS: Brain: No evidence of acute large vascular territory infarction, hemorrhage, hydrocephalus, extra-axial collection or mass lesion/mass effect. Age related global parenchymal volume loss with ex vacuo dilatation of ventricular system, not significantly changed from prior. Similar mild to moderate burden of chronic small vessel white matter ischemic disease. Vascular: No hyperdense vessel. Atherosclerotic calcifications of the intracranial portions of the internal carotid arteries. Skull: Normal. Negative for fracture or focal lesion. Sinuses/Orbits: Paranasal sinuses are predominantly clear. Orbits are unremarkable. Other: Streak artifact from dental hardware. Cerumen in the left external auditory canal. IMPRESSION: 1. No acute intracranial abnormality. 2. Similar age-related global parenchymal volume loss and chronic small vessel white matter ischemic disease. Electronically Signed   By: Dahlia Bailiff MD   On: 09/15/2020 20:58   DG Knee Complete 4 Views Right  Result Date: 09/15/2020 CLINICAL DATA:  Pain after fall. EXAM: RIGHT KNEE - COMPLETE 4+ VIEW COMPARISON:  None. FINDINGS: No evidence of fracture, dislocation, or joint effusion. Tricompartment degenerative change of the knee most advanced in the medial compartment where it appears moderate to severe. Extensive vascular calcifications. IMPRESSION: 1. No acute osseous abnormality. 2. Tricompartment degenerative change of the knee, most advanced in the medial compartment. Electronically Signed   By: Dahlia Bailiff MD   On: 09/15/2020 21:24   DG Humerus Right  Result Date: 09/15/2020 CLINICAL DATA:  Arm pain after fall today. EXAM: RIGHT HUMERUS - 2+ VIEW COMPARISON:  Radiograph December 23, 2017 and August 05, 2018 FINDINGS: Reverse right  total shoulder arthroplasty. There are well corticated osseous fragments along the inferior aspect of the humeral head which were not definitely visualized on chest radiograph August 05, 2018. There is no evidence of image shaft or distal humeral fracture. Degenerative change at the [facility] joint with inferior acromial spurring. Soft tissues are unremarkable. IMPRESSION: Well corticated osseous fragments along the inferior aspect of the humeral head which were not definitely visualized on chest radiograph August 05, 2018. Findings which may represent heterotopic ossification versus fracture fragments of uncertain chronicity. Recommend correlation with point tenderness and possible dedicated shoulder radiographs. Electronically Signed   By: Dahlia Bailiff MD   On: 09/15/2020 21:17   DG Hip Unilat W or Wo Pelvis 2-3 Views Right  Result Date: 09/15/2020 CLINICAL DATA:  Pain after fall. EXAM: DG HIP (WITH OR WITHOUT PELVIS) 2-3V RIGHT COMPARISON:  Radiograph June 05, 2017. FINDINGS: There is no evidence of hip fracture or dislocation. Mild degenerative change of the hips lower lumbar spine and pubic symphysis . IMPRESSION: No fracture or dislocation of the right hip. If there is high clinical suspicion for occult fracture or the patient refuses to weightbear, consider further evaluation with MRI or CT. Electronically Signed   By: Dahlia Bailiff MD   On: 09/15/2020 21:24    EKG: I independently viewed the EKG done and my findings are as followed: Atrial fibrillation with rate control  Assessment/Plan Present on Admission: . Acute CVA (cerebrovascular accident) ([place]) . Mixed hyperlipidemia . Atrial fibrillation (HCC)  Principal Problem:   Acute CVA (cerebrovascular accident) ([place]) Active Problems:   Mixed hyperlipidemia   Atrial fibrillation (HCC)   Recurrent UTI   Dementia (HCC)   Syncope   Hypokalemia   Hypoalbuminemia   Abrasion of right arm   Right sided weakness rule out acute  CVA Patient will be admitted to telemetry unit  MRA head/neck with and without contrast Echocardiogram in the morning MRI  of brain without contrast in the morning Continue fall precautions and neuro checks Lipid panel and hemoglobin A1c will be checked Continue PT/SLP/OT eval and treat Bedside swallow eval by nursing prior to diet Neurology will be consulted and we shall await their recommendation  Reported syncope and collapse Continue telemetry and monitor for arrhythmias EKG showed atrial fibrillation with rate control  Troponin will be checked. Echocardiogram in the morning to rule out significant aortic stenosis or other outflow obstruction, and also to evaluate EF and to rule out segmental/Regional wall motion abnormalities.  Right arm abrasion due to accidental fall Continue wound care Continue PT/OT eval and treat  Recurrent UTI s/p treatment Patient has had recurrent UTI within the last several weeks and was placed on Macrobid and followed by Augmentin based on urine culture findings. Outpatient urine culture findings done on 09/19/2020  Greater than 100,000 colony-forming units per mL of Pseudomonas aeruginosa Patient denies any burning sensation or any other irritative bladder symptoms at this time. Consider follow-up testing/treatment for recurrence of symptoms  Hypokalemia K+ 3.3, this will be replenished  Hypoalbuminemia possibly secondary to mild protein calorie malnutrition 3.1, consider protein supplements once patient is cleared for oral intake  Atrial fibrillation on Eliquis Continue home Eliquis when MRI rules out any intracranial hemorrhage and patient cleared for oral intake Continue home metoprolol when patient is cleared for oral intake  Essential hypertension BP 159/107, permissive hypertension will be allowed at this time  Dementia Continue home memantine after bedside swallow eval clearance   DVT prophylaxis: SCDs  Code Status: Full  code  Family Communication: Daughter at bedside (all questions answered to satisfaction)  Disposition Plan: Patient is from:                        home Anticipated DC to:                   SNF or family members home Anticipated DC date:               2-3 days Anticipated DC barriers:         patient requires inpatient work-up to rule out stroke and syncope  Consults called: Neurology  Admission status: Observation    Bernadette Hoit MD [facility]  09/16/2020, 2:37 AM

## 2020-09-16 NOTE — Consult Note (Signed)
STROKE CONSULT      Virginia Young is an 80 y.o. female.   ASSESSMENT/PLAN: 1.  Syncope likely due to orthostatic hypotension.  The etiology of the orthostatic hypotension is likely multifactorial including acute UTI and medication effect.  Medication culprits includes Seroquel in particular.  This medication will be reduced to 25 mg.  The patient should also receive physiotherapy.  If she remains orthostatic, she may have primary neurogenic orthostatic hypotension requiring pharmacotherapy possibly midodrine or Florinef. 2.  Right sided pain which I suspect is likely due to thalamic pain syndrome from her remote left thalamic infarct.  Cymbalta will be initiated. 3.  Remote left thalamic infarct 4.  Chronic atrial fibrillation continue with anticoagulation with Eliquis.    This an 80 year old white female who presents with episode of dizziness resulting in syncope.  She reports that she was sitting when she stood up and reported having dizziness described as a spinning-like sensation.  She blacked out and fell to the ground.  She does not report having focal numbness or weakness.  She has been having pain involving the right shoulder, right thoracic and abdominal region and right leg.  This has been present for about a month or so.  No fractures or injuries are reported.  She denies any dysarthria, dysphagia chest pain or shortness of breath with the current symptoms.  She has had episodes of chest pain or shortness of breath in the past but not with this current event.  No recent falls reported other than the current event.  The review of systems otherwise negative.    GENERAL: This a pleasant obese female who is doing well at this time.  HEENT: Neck is supple no trauma noted.  ABDOMEN: soft; no bruise or trauma is noted of the abdominal region.  THORAX; Also no evidence of bruising or trauma noted  EXTREMITIES: No edema; significant OA changes of the knees bilaterally.  BACK:  Normal  SKIN: Normal by inspection.    MENTAL STATUS: Alert and oriented. Speech, language and cognition are generally intact. Judgment and insight normal.   CRANIAL NERVES: Pupils are equal, round and reactive to light and accomodation; extra ocular movements are full, there is no significant nystagmus; visual fields are full; upper and lower facial muscles are normal in strength and symmetric, there is no flattening of the nasolabial folds; tongue is midline; uvula is midline; shoulder elevation is normal.  MOTOR: There is mild drift of the right upper extremity and right leg.  Normal tone, bulk and strength;   COORDINATION: Left finger to nose is normal, right finger to nose is normal, No rest tremor; no intention tremor; no postural tremor; no bradykinesia.  REFLEXES: Deep tendon reflexes are symmetrical and normal.   SENSATION: Significant hyperalgesia involving the right upper extremity and right side of the trunk.     Blood pressure 119/74, pulse 84, temperature 98.3 F (36.8 C), temperature source Oral, resp. rate 20, height 5\' 6"  (1.676 m), weight 98.2 kg, SpO2 97 %.  Past Medical History:  Diagnosis Date  . Allergy   . Atrial fibrillation (HCC)    coumadin  . Cataract   . Chest pain 03/2012   a. Lex MV 10/13:  EF 64%, dist ant and apical defect sugg of soft tissue atten, no ischemia  . Chronic systolic heart failure ([place])   . Depression   . GERD (gastroesophageal reflux disease)   . HLD (hyperlipidemia)   . Mild mitral regurgitation   . MS (multiple  sclerosis) ([place])   . NICM (nonischemic cardiomyopathy) ([place])    a. neg CLite in 2003;  b. EF 40-45% in past;   c.  Echo 12/11: EF 35-40%, mild MR, mild LAE, mild RAE, small pericardial effusion   . Osteoarthritis   . Osteoporosis   . Stasis ulcer ([place])   . Vaginal prolapse without uterine prolapse   . Varicose veins     Past Surgical History:  Procedure Laterality Date  . ANTERIOR AND POSTERIOR VAGINAL REPAIR W/  SACROSPINOUS LIGAMENT SUSPENSION  2016   [facility]  . BLADDER SURGERY    . CARPAL TUNNEL RELEASE Right 08/22/2017   Procedure: CARPAL TUNNEL RELEASE;  Surgeon: [name], MD;  Location: [facility];  Service: Orthopedics;  Laterality: Right;  . cataract surgery Bilateral   . EYE SURGERY    . INGUINAL HERNIA REPAIR     right  . LIPOMA EXCISION     h/o removed from upper back x 2  . REVERSE SHOULDER ARTHROPLASTY Right 08/22/2017   Procedure: REVERSE SHOULDER ARTHROPLASTY;  Surgeon: [name], MD;  Location: [facility];  Service: Orthopedics;  Laterality: Right;    Family History  Problem Relation Age of Onset  . Diabetes Father 18       diabetes and syncope  . Heart attack Father   . Heart attack Sister   . Heart attack Brother        all 5 brothers had MIs  . Stroke Brother   . Cancer Sister        bone  . Diabetes Sister   . Cancer Sister   . Diabetes Sister   . Heart attack Brother   . Dementia Mother   . Dementia Brother   . CAD Other   . Diabetes Daughter   . Colon cancer Neg Hx   . Neuropathy Neg Hx     Social History:  reports that she has never smoked. She has never used smokeless tobacco. She reports that she does not drink alcohol and does not use drugs.  Allergies:  Allergies  Allergen Reactions  . Bactrim [Sulfamethoxazole-Trimethoprim]     Mouth sores  . Lipitor [Atorvastatin Calcium]     myalgia  . Lisinopril     cough    Medications: Prior to Admission medications   Medication Sig Start Date End Date Taking? Authorizing Provider  amoxicillin-clavulanate (AUGMENTIN) 500-125 MG tablet Take 1 tablet by mouth in the morning and at bedtime.   Yes [provider]  [employer] D3 25 MCG (1000 UT) capsule TAKE 1 CAPSULE (1,000 UNITS TOTAL) BY MOUTH DAILY. 09/27/19  Yes Rakes, Connye Burkitt, FNP  diclofenac Sodium (VOLTAREN) 1 % GEL Apply 2 g topically daily as needed (For pain).   Yes [provider]  divalproex (DEPAKOTE ER) 250 MG 24 hr tablet Take 1 tablet (250  mg total) by mouth daily. 12/22/19  Yes Lomax, Amy, NP  ELIQUIS 5 MG TABS tablet Take 1 tablet (5 mg total) by mouth every 12 (twelve) hours. 08/17/20  Yes Hawks, Christy A, FNP  furosemide (LASIX) 20 MG tablet Take 3 tablets (60 mg total) by mouth daily. 11/10/19  Yes Minus Breeding, MD  Incontinence Supply Disposable (SELECT DISPOSABLE UNDERWEAR LG) MISC 1 application by Does not apply route in the morning and at bedtime. 08/22/20  Yes Hawks, Christy A, FNP  ketoconazole (NIZORAL) 2 % cream Apply 1 application topically daily. 08/11/20  Yes Hawks, Theador Hawthorne, FNP  linaclotide (LINZESS) 72 MCG capsule Take 1 capsule (  72 mcg total) by mouth daily before breakfast. 01/19/20  Yes Hawks, Christy A, FNP  losartan (COZAAR) 25 MG tablet TAKE 1/2 TABLET BY MOUTH EVERY DAY Patient taking differently: Take 12.5 mg by mouth daily. 09/21/19  Yes Minus Breeding, MD  memantine (NAMENDA) 5 MG tablet Take 1 tablet (5 mg total) by mouth 2 (two) times daily. 11/24/19  Yes Lomax, Amy, NP  metoprolol tartrate (LOPRESSOR) 25 MG tablet Take 1 tablet (25 mg total) by mouth 2 (two) times daily. 09/06/19 01/12/20 Yes Minus Breeding, MD  Multiple Vitamin (MULTIVITAMIN) tablet Take 1 tablet by mouth daily.   Yes [provider]  nystatin (MYCOSTATIN/NYSTOP) powder Apply 1 application topically 3 (three) times daily. 08/11/20  Yes Hawks, Christy A, FNP  ondansetron (ZOFRAN) 4 MG tablet Take 1 tablet (4 mg total) by mouth every 8 (eight) hours as needed for nausea or vomiting. 09/12/20  Yes Evelina Dun A, FNP  OXYGEN Inhale into the lungs. 2 liters PRN - anytime when needed ( cardio rx'd)   Yes [provider]  QUEtiapine (SEROQUEL) 200 MG tablet Take 1 tablet (200 mg total) by mouth at bedtime. Patient taking differently: Take 200 mg by mouth daily. 09/12/20  Yes Hawks, Christy A, FNP  traMADol (ULTRAM) 50 MG tablet Take 1-2 tablets (50-100 mg total) by mouth every 12 (twelve) hours as needed. 09/05/20   Sharion Balloon, FNP    Scheduled Meds: . apixaban  5 mg Oral Q12H  . bacitracin  1 application Topical BID  . cefTAZidime  1 g Intramuscular Q12H  . divalproex  250 mg Oral Daily  . [START ON 09/17/2020] linaclotide  72 mcg Oral QAC breakfast  . memantine  5 mg Oral BID  . metoprolol tartrate  25 mg Oral BID  . potassium chloride  40 mEq Oral BID  . QUEtiapine  200 mg Oral QHS  . Tdap  0.5 mL Intramuscular Once   Continuous Infusions: . sodium chloride    . magnesium sulfate bolus IVPB     PRN Meds:.ondansetron, traMADol     Results for orders placed or performed during the hospital encounter of 09/15/20 (from the past 48 hour(s))  Ethanol     Status: None   Collection Time: 09/15/20  8:37 PM  Result Value Ref Range   Alcohol, Ethyl (B) <10 <10 mg/dL    Comment: (NOTE) Lowest detectable limit for serum alcohol is 10 mg/dL.  For medical purposes only. Performed at [facility], [address] [place], [place] [address]   Protime-INR     Status: Abnormal   Collection Time: 09/15/20  8:38 PM  Result Value Ref Range   Prothrombin Time 18.2 (H) 11.4 - 15.2 seconds   INR 1.6 (H) 0.8 - 1.2    Comment: (NOTE) INR goal varies based on device and disease states. Performed at [facility], [address], [place], [place] [address]   APTT     Status: None   Collection Time: 09/15/20  8:38 PM  Result Value Ref Range   aPTT 32 24 - 36 seconds    Comment: Performed at [facility], [address] [place], [place] [address]  CBC     Status: None   Collection Time: 09/15/20  8:38 PM  Result Value Ref Range   WBC 9.8 4.0 - 10.5 K/uL   RBC 4.66 3.87 - 5.11 MIL/uL   Hemoglobin 13.8 12.0 - 15.0 g/dL   HCT 42.9 36.0 - 46.0 %   MCV 92.1  80.0 - 100.0 fL   MCH 29.6 26.0 - 34.0 pg   MCHC 32.2 30.0 - 36.0 g/dL   RDW 13.9 11.5 - 15.5 %   Platelets 225 150 - 400 K/uL   nRBC 0.0 0.0 - 0.2 %    Comment: Performed at [facility], [address] [place], [place] [address]  Differential     Status:  Abnormal   Collection Time: 09/15/20  8:38 PM  Result Value Ref Range   Neutrophils Relative % 66 %   Neutro Abs 6.5 1.7 - 7.7 K/uL   Lymphocytes Relative 19 %   Lymphs Abs 1.9 0.7 - 4.0 K/uL   Monocytes Relative 10 %   Monocytes Absolute 1.0 0.1 - 1.0 K/uL   Eosinophils Relative 3 %   Eosinophils Absolute 0.3 0.0 - 0.5 K/uL   Basophils Relative 1 %   Basophils Absolute 0.1 0.0 - 0.1 K/uL   Immature Granulocytes 1 %   Abs Immature Granulocytes 0.08 (H) 0.00 - 0.07 K/uL    Comment: Performed at [facility], [address], [place], [place] [address]  Comprehensive metabolic panel     Status: Abnormal   Collection Time: 09/15/20  8:38 PM  Result Value Ref Range   Sodium 137 135 - 145 mmol/L   Potassium 3.3 (L) 3.5 - 5.1 mmol/L   Chloride 99 98 - 111 mmol/L   CO2 29 22 - 32 mmol/L   Glucose, Bld 125 (H) 70 - 99 mg/dL    Comment: Glucose reference range applies only to samples taken after fasting for at least 8 hours.   BUN 19 8 - 23 mg/dL   Creatinine, Ser 1.32 (H) 0.44 - 1.00 mg/dL   Calcium 8.1 (L) 8.9 - 10.3 mg/dL   Total Protein 6.1 (L) 6.5 - 8.1 g/dL   Albumin 3.1 (L) 3.5 - 5.0 g/dL   AST 25 15 - 41 U/L   ALT 23 0 - 44 U/L   Alkaline Phosphatase 70 38 - 126 U/L   Total Bilirubin 0.7 0.3 - 1.2 mg/dL   GFR, Estimated 41 (L) >60 mL/min    Comment: (NOTE) Calculated using the CKD-EPI Creatinine Equation (2021)    Anion gap 9 5 - 15    Comment: Performed at [facility], [address] [place], [place] [address]  Valproic acid level     Status: Abnormal   Collection Time: 09/15/20  8:38 PM  Result Value Ref Range   Valproic Acid Lvl 25 (L) 50.0 - 100.0 ug/mL    Comment: Performed at [facility], [address] [place], [place] [address]  Resp Panel by RT-PCR (Flu A&B, Covid) Nasopharyngeal Swab     Status: None   Collection Time: 09/15/20  9:00 PM   Specimen: Nasopharyngeal Swab; Nasopharyngeal(NP) swabs in vial transport medium  Result Value Ref Range   SARS  Coronavirus 2 by RT PCR NEGATIVE NEGATIVE    Comment: (NOTE) SARS-CoV-2 target nucleic acids are NOT DETECTED.  The SARS-CoV-2 RNA is generally detectable in upper respiratory specimens during the acute phase of infection. The lowest concentration of SARS-CoV-2 viral copies this assay can detect is 138 copies/mL. A negative result does not preclude SARS-Cov-2 infection and should not be used as the sole basis for treatment or other patient management decisions. A negative result may occur with  improper specimen collection/handling, submission of specimen other than nasopharyngeal swab, presence of viral mutation(s) within the areas targeted by this assay, and inadequate number of viral copies(<138 copies/mL).  A negative result must be combined with clinical observations, patient history, and epidemiological information. The expected result is Negative.  Fact Sheet for Patients:  EntrepreneurPulse.com.au  Fact Sheet for Healthcare Providers:  IncredibleEmployment.be  This test is no t yet approved or cleared by the [place] FDA and  has been authorized for detection and/or diagnosis of SARS-CoV-2 by FDA under an Emergency Use Authorization (EUA). This EUA will remain  in effect (meaning this test can be used) for the duration of the COVID-19 declaration under Section 564(b)(1) of the Act, 21 U.S.C.section 360bbb-3(b)(1), unless the authorization is terminated  or revoked sooner.       Influenza A by PCR NEGATIVE NEGATIVE   Influenza B by PCR NEGATIVE NEGATIVE    Comment: (NOTE) The Xpert Xpress SARS-CoV-2/FLU/RSV plus assay is intended as an aid in the diagnosis of influenza from Nasopharyngeal swab specimens and should not be used as a sole basis for treatment. Nasal washings and aspirates are unacceptable for Xpert Xpress SARS-CoV-2/FLU/RSV testing.  Fact Sheet for Patients: EntrepreneurPulse.com.au  Fact Sheet  for Healthcare Providers: IncredibleEmployment.be  This test is not yet approved or cleared by the [place] FDA and has been authorized for detection and/or diagnosis of SARS-CoV-2 by FDA under an Emergency Use Authorization (EUA). This EUA will remain in effect (meaning this test can be used) for the duration of the COVID-19 declaration under Section 564(b)(1) of the Act, 21 U.S.C. section 360bbb-3(b)(1), unless the authorization is terminated or revoked.  Performed at [facility], [address], [place], [place] [address]   Comprehensive metabolic panel     Status: Abnormal   Collection Time: 09/16/20  2:09 AM  Result Value Ref Range   Sodium 138 135 - 145 mmol/L   Potassium 3.1 (L) 3.5 - 5.1 mmol/L   Chloride 98 98 - 111 mmol/L   CO2 32 22 - 32 mmol/L   Glucose, Bld 98 70 - 99 mg/dL    Comment: Glucose reference range applies only to samples taken after fasting for at least 8 hours.   BUN 16 8 - 23 mg/dL   Creatinine, Ser 1.22 (H) 0.44 - 1.00 mg/dL   Calcium 8.5 (L) 8.9 - 10.3 mg/dL   Total Protein 5.9 (L) 6.5 - 8.1 g/dL   Albumin 3.1 (L) 3.5 - 5.0 g/dL   AST 25 15 - 41 U/L   ALT 22 0 - 44 U/L   Alkaline Phosphatase 65 38 - 126 U/L   Total Bilirubin 0.8 0.3 - 1.2 mg/dL   GFR, Estimated 45 (L) >60 mL/min    Comment: (NOTE) Calculated using the CKD-EPI Creatinine Equation (2021)    Anion gap 8 5 - 15    Comment: Performed at [facility], [place] [address], [place], [place] [address]  CBC     Status: None   Collection Time: 09/16/20  2:09 AM  Result Value Ref Range   WBC 9.1 4.0 - 10.5 K/uL   RBC 4.89 3.87 - 5.11 MIL/uL   Hemoglobin 14.5 12.0 - 15.0 g/dL   HCT 44.4 36.0 - 46.0 %   MCV 90.8 80.0 - 100.0 fL   MCH 29.7 26.0 - 34.0 pg   MCHC 32.7 30.0 - 36.0 g/dL   RDW 13.9 11.5 - 15.5 %   Platelets 224 150 - 400 K/uL   nRBC 0.0 0.0 - 0.2 %    Comment: Performed at [facility], [place] [address] [place], [place] [address]  Protime-INR  Status: Abnormal   Collection Time: 09/16/20  2:09 AM  Result Value Ref Range   Prothrombin Time 17.3 (H) 11.4 - 15.2 seconds   INR 1.5 (H) 0.8 - 1.2    Comment: (NOTE) INR goal varies based on device and disease states. Performed at [facility], [place] [address], [place], [place] [address]   APTT     Status: None   Collection Time: 09/16/20  2:09 AM  Result Value Ref Range   aPTT 32 24 - 36 seconds    Comment: Performed at [facility] [address], [place], [place] [address]  Magnesium     Status: None   Collection Time: 09/16/20  2:09 AM  Result Value Ref Range   Magnesium 2.1 1.7 - 2.4 mg/dL    Comment: Performed at [facility] [address], [place], [place] [address]  Phosphorus     Status: None   Collection Time: 09/16/20  2:09 AM  Result Value Ref Range   Phosphorus 3.1 2.5 - 4.6 mg/dL    Comment: Performed at [facility] [address], [place], [place] [address]  Urine rapid drug screen (hosp performed)     Status: None   Collection Time: 09/16/20  4:30 AM  Result Value Ref Range   Opiates NONE DETECTED NONE DETECTED   Cocaine NONE DETECTED NONE DETECTED   Benzodiazepines NONE DETECTED NONE DETECTED   Amphetamines NONE DETECTED NONE DETECTED   Tetrahydrocannabinol NONE DETECTED NONE DETECTED   Barbiturates NONE DETECTED NONE DETECTED    Comment: (NOTE) DRUG SCREEN FOR MEDICAL PURPOSES ONLY.  IF CONFIRMATION IS NEEDED FOR ANY PURPOSE, NOTIFY LAB WITHIN 5 DAYS.  LOWEST DETECTABLE LIMITS FOR URINE DRUG SCREEN Drug Class                     Cutoff (ng/mL) Amphetamine and metabolites    1000 Barbiturate and metabolites    200 Benzodiazepine                 947 Tricyclics and metabolites     300 Opiates and metabolites        300 Cocaine and metabolites        300 THC                            50 Performed at [facility], [place] [address] [place], [place] [address]   Urinalysis, Routine w reflex microscopic Urine, Clean  Catch     Status: Abnormal   Collection Time: 09/16/20  4:30 AM  Result Value Ref Range   Color, Urine YELLOW YELLOW   APPearance HAZY (A) CLEAR   Specific Gravity, Urine 1.009 1.005 - 1.030   pH 7.0 5.0 - 8.0   Glucose, UA NEGATIVE NEGATIVE mg/dL   Hgb urine dipstick SMALL (A) NEGATIVE   Bilirubin Urine NEGATIVE NEGATIVE   Ketones, ur 5 (A) NEGATIVE mg/dL   Protein, ur NEGATIVE NEGATIVE mg/dL   Nitrite NEGATIVE NEGATIVE   Leukocytes,Ua LARGE (A) NEGATIVE   RBC / HPF 6-10 0 - 5 RBC/hpf   WBC, UA >50 (H) 0 - 5 WBC/hpf   Bacteria, UA RARE (A) NONE SEEN   Squamous Epithelial / LPF 0-5 0 - 5    Comment: Performed at [facility], [address], [place],  [address]  Lipid panel     Status: Abnormal   Collection Time:  09/16/20  4:50 AM  Result Value Ref Range   Cholesterol 152 0 - 200 mg/dL   Triglycerides 78 <150 mg/dL   HDL 40 (L) >40 mg/dL   Total CHOL/HDL Ratio 3.8 RATIO   VLDL 16 0 - 40 mg/dL   LDL Cholesterol 96 0 - 99 mg/dL    Comment:        Total Cholesterol/HDL:CHD Risk Coronary Heart Disease Risk Table                     Men   Women  1/2 Average Risk   3.4   3.3  Average Risk       5.0   4.4  2 X Average Risk   9.6   7.1  3 X Average Risk  23.4   11.0        Use the calculated Patient Ratio above and the CHD Risk Table to determine the patient's CHD Risk.        ATP III CLASSIFICATION (LDL):  <100     mg/dL   Optimal  100-129  mg/dL   Near or Above                    Optimal  130-159  mg/dL   Borderline  160-189  mg/dL   High  >190     mg/dL   Very High Performed at [facility] [address], [place], [place] [address]   Hemoglobin A1c     Status: Abnormal   Collection Time: 09/16/20  4:50 AM  Result Value Ref Range   Hgb A1c MFr Bld 6.4 (H) 4.8 - 5.6 %    Comment: (NOTE) Pre diabetes:          5.7%-6.4%  Diabetes:              >6.4%  Glycemic control for   <7.0% adults with diabetes    Mean Plasma Glucose 136.98 mg/dL     Comment: Performed at [facility] [address], [place], [place] [address]    Studies/Results:   Head CT IMPRESSION: 1. No acute intracranial abnormality. 2. Similar age-related global parenchymal volume loss and chronic small vessel white matter ischemic disease.   BRAIN MRI MRA NECK MRA IMPRESSION: Brain MRI:  1. No emergent finding. 2. Atrophy and chronic small vessel ischemia.  Intracranial MRA:  No emergent finding.  Atherosclerosis most notably affecting the posterior cerebral arteries with high-grade narrowing at the left P2 and P4 segments.  Neck MRA:  No emergent finding. No stenosis or ulceration of the major vessels.  MRI reviewed in person and shows moderate atrophy.  There is remote left lateral thalamic encephalomalacia consistent with a lacunar infarct.  There is no acute changes noted including no acute change ischemic strokes.    Nuriyah Hanline A. Merlene Laughter, M.D.  Diplomate, Tax adviser of Psychiatry and Neurology ( Neurology). 09/16/2020, 12:24 PM

## 2020-09-16 NOTE — Progress Notes (Signed)
  Echocardiogram 2D Echocardiogram has been performed.  Michiel Cowboy 09/16/2020, 9:34 AM

## 2020-09-16 NOTE — Progress Notes (Signed)
SLP Cancellation Note  Patient Details Name: Virginia Young MRN: [id number] DOB: 02-05-1941   Cancelled treatment:       Reason Eval/Treat Not Completed: SLP screened, no needs identified, will sign off. Pt has passed the [place] swallow screen and has been started on a PO diet with regular solids and thin liquids. Therefore, will defer SLP swallowing evaluation per protocol. MD may wish to consider cognitive-linguistic evaluation if indicated, although note that MRI was also negative for acute findings.    Osie Bond., M.A. [facility] Pager [phone] Office [phone]  09/16/2020, 12:08 PM

## 2020-09-16 NOTE — Evaluation (Signed)
Occupational Therapy Evaluation Patient Details Name: Virginia Young MRN: [id number] DOB: 1940/12/07 Today's Date: 09/16/2020    History of Present Illness 80 y/o female presented to ED on 3/25 due to lightheadedness and syncopal episode. Head CT and x-rays were all negative. PMH: Afib on Eliquis, HTN, mild dementia, CHF, GERD, HLD, MS, recurrent UTIs   Clinical Impression   Pt PTA: pt living alone, family and friends nearby to assist. Pt reports independence. Pt currently, decreased strength, decreased activity tolerance and +orthostatics with positional change. Pt supervisionA to [place] for ADL and miguardA for mobility. Pt would benefit from continued OT skilled services for ADL, mobility and safety. OT following acutely. (see general comments for +orthostatics or PT eval)    Follow Up Recommendations  Home health OT;Supervision/Assistance - 24 hour    Equipment Recommendations  None recommended by OT    Recommendations for Other Services       Precautions / Restrictions Precautions Precautions: Fall Restrictions Weight Bearing Restrictions: No      Mobility Bed Mobility Overal bed mobility: Needs Assistance Bed Mobility: Supine to Sit     Supine to sit: Min guard     General bed mobility comments: min guard for safety, no physical assistance required    Transfers Overall transfer level: Needs assistance Equipment used: Rolling walker (2 wheeled) Transfers: Sit to/from [facility] Sit to Stand: Min guard Stand pivot transfers: Min guard       General transfer comment: min guard for safety, assessed orthostatics throughout transitions    Balance Overall balance assessment: Mild deficits observed, not formally tested                                         ADL either performed or assessed with clinical judgement   ADL Overall ADL's : Needs assistance/impaired Eating/Feeding: Set up;Sitting   Grooming: Set up;Sitting    Upper Body Bathing: Set up;Sitting   Lower Body Bathing: Moderate assistance;Sitting/lateral leans;Sit to/from stand   Upper Body Dressing : Set up;Sitting   Lower Body Dressing: Moderate assistance;Sitting/lateral leans;Sit to/from stand;Cueing for safety   Toilet Transfer: Min guard;Stand-pivot;BSC;RW   Toileting- Clothing Manipulation and Hygiene: Minimal assistance;Sitting/lateral lean;Sit to/from stand;Cueing for safety       Functional mobility during ADLs: Min guard;+2 for safety/equipment;Rolling walker;Cueing for safety;Cueing for sequencing General ADL Comments: Pt limited by +orthostatic hypotension and inability to stand or sit without feeling dizzy.     Vision Baseline Vision/History: Wears glasses Wears Glasses: At all times Patient Visual Report: No change from baseline Vision Assessment?: No apparent visual deficits     Perception     Praxis      Pertinent Vitals/Pain Pain Assessment: Faces Faces Pain Scale: No hurt Pain Intervention(s): Monitored during session     Hand Dominance     Extremity/Trunk Assessment Upper Extremity Assessment Upper Extremity Assessment: Generalized weakness   Lower Extremity Assessment Lower Extremity Assessment: Generalized weakness   Cervical / Trunk Assessment Cervical / Trunk Assessment: Other exceptions Cervical / Trunk Exceptions: large body habitus   Communication Communication Communication: No difficulties   Cognition Arousal/Alertness: Awake/alert Behavior During Therapy: WFL for tasks assessed/performed Overall Cognitive Status: Within Functional Limits for tasks assessed  General Comments  Pt with + orthostatics BP supine: 114/63, 74 BPM  sitting: 87/64,  87 BPM  standing: 71/58, 112 BPM  standing: 2 mins 79/54 139 BPM  sitting after exertion: 106/60, 92 BPM  sitting 73 BPM, O2 97% on RA.    Exercises     Shoulder Instructions      Home Living  Family/patient expects to be discharged to:: Private residence Living Arrangements: Alone Available Help at Discharge: Personal care attendant;Available PRN/intermittently;Family Type of Home: House Home Access: Ramped entrance     [place]: One level     Bathroom Shower/Tub: Occupational psychologist: Handicapped height     Home Equipment: Environmental consultant - 4 wheels;Bedside commode;Tub bench          Prior Functioning/Environment Level of Independence: Needs assistance  Gait / Transfers Assistance Needed: Ambulates with rollator ADL's / Homemaking Assistance Needed: Aid assists with bathe/dress/ cook/clean 4 hours 2-3x/week   Comments: Saturdays- daughter takes her out        OT Problem List: Decreased strength;Decreased activity tolerance;Impaired balance (sitting and/or standing);Decreased safety awareness;Pain      OT Treatment/Interventions: Self-care/ADL training;Therapeutic exercise;Neuromuscular education;Energy conservation;DME and/or AE instruction;Therapeutic activities;Cognitive remediation/compensation;Patient/family education;Balance training    OT Goals(Current goals can be found in the care plan section) Acute Rehab OT Goals Patient Stated Goal: to go home OT Goal Formulation: With patient Time For Goal Achievement: 09/30/20 Potential to Achieve Goals: Good ADL Goals Pt Will Perform Grooming: (P) with supervision;standing Pt Will Transfer to Toilet: (P) with supervision;ambulating Additional ADL Goal #1: (P) Pt will increase to x5 mins of OOB ADL with supervisionA in standing to increase independence with ADL.  OT Frequency: Min 2X/week   Barriers to D/C:            Co-evaluation              AM-PAC OT "6 Clicks" Daily Activity     Outcome Measure Help from another person eating meals?: None Help from another person taking care of personal grooming?: A Little Help from another person toileting, which includes using toliet, bedpan, or  urinal?: A Lot Help from another person bathing (including washing, rinsing, drying)?: A Lot Help from another person to put on and taking off regular upper body clothing?: A Little Help from another person to put on and taking off regular lower body clothing?: A Lot 6 Click Score: 16   End of Session Nurse Communication: Mobility status  Activity Tolerance: Patient tolerated treatment well Patient left: in chair;with call bell/phone within reach;with chair alarm set  OT Visit Diagnosis: Unsteadiness on feet (R26.81);Muscle weakness (generalized) (M62.81);Pain                Time: 1010-1024 OT Time Calculation (min): 14 min Charges:  OT General Charges $OT Visit: 1 Visit OT Evaluation $OT Eval Moderate Complexity: 1 Mod  Jefferey Pica, OTR/L Acute Rehabilitation Services Pager: [phone] Office: [phone]   Nashaly Dorantes C 09/16/2020, 3:01 PM

## 2020-09-16 NOTE — Progress Notes (Signed)
[facility] PROGRESS NOTE    Progress Note  Virginia Young  JKD:[id number] DOB: May 22, 1941 DOA: 09/15/2020 PCP: Sharion Balloon, FNP     Brief Narrative:   Virginia Young is an 80 y.o. female past medical history significant for A. fib on Eliquis, chronic systolic heart failure with an EF of 25% by 2D echo in October 2020 essential hypertension obesity recently treated for UTI as an outpatient with Augmentin.  Comes into the emergency room for lightheadedness and suspect syncope walking with her walker in the kitchen she flatly felt dizzy and passed out forward hitting her head she relates it was about a minute. In the ED she was found hemodynamically stable  Significant studies: Right knee x-ray showed no fractures. Right hip x-ray showed no fracture or dislocation. Right humerus x-ray showed heterotropic ossification versus fracture of uncertain chronicity. CT of the head showed no acute findings. 09/16/2020 MRI of the brain showed no acute findings atrophic and chronic small vessel disease. 09/16/2020 MRI of the head showed no significant stenosis.  Antibiotics: None  Microbiology data: Blood culture:  Procedures: None  Assessment/Plan:   Syncope: MRI of the brain showed no acute findings. 2D echo pending. Telemetry shows paroxysmal atrial fibrillation. Twelve-lead EKG shows A. fib/a flutter. She relates a large history of dizziness upon standing, with a recent urinary tract infection. We will go ahead and start her on IV fluids check orthostatic vitals stat. Hold all antihypertensive medication, except for metoprolol.  Right-sided weakness: MRI negative for stroke. Physical therapy and Occupational Therapy evaluation are pending. Allow a diet.  Recurrent UTI status post treatment: Urine culture on 09/12/2020 was positive for Pseudomonas, discontinue amoxicillin and start her on IV Fortaz.  Hypokalemia: Likely due to hypovolemia: We will replete  orally recheck in the morning give her mag bolus IV.  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  Chronic atrial fibrillation: Rate controlled on Eliquis.  Right arm abrasion due to accidental fall: Continue wound care. Physical therapy has been consulted.  Essential hypertension: Hold all antihypertensive medication except for metoprolol.  Moderate dementia: Continue Namenda.   DVT prophylaxis: lovenox Family Communication:none Status is: Observation  The patient will require care spanning > 2 midnights and should be moved to inpatient because: Hemodynamically unstable  Dispo: The patient is from: Home              Anticipated d/c is to: SNF              Patient currently is not medically stable to d/c.   Difficult to place patient No  Code Status:     Code Status Orders  (From admission, onward)         Start     Ordered   09/16/20 0147  Full code  Continuous        09/16/20 0146        Code Status History    Date Active Date Inactive Code Status Order ID Comments User Context   08/22/2017 1234 08/25/2017 2232 Full Code [id number]  Virginia Cedars, MD Inpatient   05/06/2013 1631 05/10/2013 1653 Full Code [id number]  Virginia Hazel, MD ED   Advance Care Planning Activity    Advance Directive Documentation   Flowsheet Row Most Recent Value  Type of Advance Directive Healthcare Power of Attorney  Pre-existing out of facility DNR order (yellow form or pink MOST form) --  "MOST" Form in Place? --  IV Access:    Peripheral IV   Procedures and diagnostic studies:   CT HEAD WO CONTRAST  Result Date: 09/15/2020 CLINICAL DATA:  Neuro deficit, stroke suspected, right-sided numbness. EXAM: CT HEAD WITHOUT CONTRAST TECHNIQUE: Contiguous axial images were obtained from the base of the skull through the vertex without intravenous contrast. COMPARISON:  MRI brain February 27, 2018 and head CT July 25, 2016  FINDINGS: Brain: No evidence of acute large vascular territory infarction, hemorrhage, hydrocephalus, extra-axial collection or mass lesion/mass effect. Age related global parenchymal volume loss with ex vacuo dilatation of ventricular system, not significantly changed from prior. Similar mild to moderate burden of chronic small vessel white matter ischemic disease. Vascular: No hyperdense vessel. Atherosclerotic calcifications of the intracranial portions of the internal carotid arteries. Skull: Normal. Negative for fracture or focal lesion. Sinuses/Orbits: Paranasal sinuses are predominantly clear. Orbits are unremarkable. Other: Streak artifact from dental hardware. Cerumen in the left external auditory canal. IMPRESSION: 1. No acute intracranial abnormality. 2. Similar age-related global parenchymal volume loss and chronic small vessel white matter ischemic disease. Electronically Signed   By: Dahlia Bailiff MD   On: 09/15/2020 20:58   MR ANGIO HEAD WO CONTRAST  Result Date: 09/16/2020 CLINICAL DATA:  Syncopal episode EXAM: MR HEAD WITHOUT CONTRAST MR CIRCLE OF WILLIS WITHOUT CONTRAST MRA OF THE NECK WITHOUT AND WITH CONTRAST TECHNIQUE: Multiplanar, multiecho pulse sequences of the brain, circle of willis and surrounding structures were obtained without intravenous contrast. Angiographic images of the neck were obtained using MRA technique without and with intravenous contrast. CONTRAST:  40mL GADAVIST GADOBUTROL 1 MMOL/ML IV SOLN COMPARISON:  Head CT from yesterday and brain MRI 02/27/2018 FINDINGS: MR HEAD FINDINGS Brain: No acute infarction, hemorrhage, hydrocephalus, extra-axial collection or mass lesion. Chronic small vessel ischemia in the deep cerebral white matter. Chronic lacune at the left thalamus. Generalized atrophy. Vascular: See below.  Normal flow voids Skull and upper cervical spine: Normal marrow signal. Degenerative facet spurring. Sinuses/Orbits: Bilateral cataract resection MR CIRCLE OF  WILLIS FINDINGS Mild left vertebral artery dominance. Dominant right PICA and left AICA. No significant anatomic finding in the anterior circulation. The carotid, vertebral, and basilar arteries are smooth and widely patent. Atheromatous irregularity and narrowing primarily affecting the posterior cerebral arteries and most notably affecting the left P2 segment and upper branch left PCA. No carotid branch occlusion or flow limiting stenosis. Negative for aneurysm. MRA NECK FINDINGS Incidental subcutaneous lipoma in the left paramedian posterior neck measuring 4 cm. By time-of-flight technique there is antegrade flow in both carotid and vertebral arteries. Normal arch with 3 vessel branching. Carotid and vertebral arteries show no ulceration, stenosis, or beading. IMPRESSION: Brain MRI: 1. No emergent finding. 2. Atrophy and chronic small vessel ischemia. Intracranial MRA: No emergent finding. Atherosclerosis most notably affecting the posterior cerebral arteries with high-grade narrowing at the left P2 and P4 segments. Neck MRA: No emergent finding. No stenosis or ulceration of the major vessels. Electronically Signed   By: Monte Fantasia M.D.   On: 09/16/2020 07:27   MR ANGIO NECK W WO CONTRAST  Result Date: 09/16/2020 CLINICAL DATA:  Syncopal episode EXAM: MR HEAD WITHOUT CONTRAST MR CIRCLE OF WILLIS WITHOUT CONTRAST MRA OF THE NECK WITHOUT AND WITH CONTRAST TECHNIQUE: Multiplanar, multiecho pulse sequences of the brain, circle of willis and surrounding structures were obtained without intravenous contrast. Angiographic images of the neck were obtained using MRA technique without and with intravenous contrast. CONTRAST:  75mL GADAVIST GADOBUTROL 1 MMOL/ML IV SOLN COMPARISON:  Head CT from yesterday and brain MRI 02/27/2018 FINDINGS: MR HEAD FINDINGS Brain: No acute infarction, hemorrhage, hydrocephalus, extra-axial collection or mass lesion. Chronic small vessel ischemia in the deep cerebral white matter.  Chronic lacune at the left thalamus. Generalized atrophy. Vascular: See below.  Normal flow voids Skull and upper cervical spine: Normal marrow signal. Degenerative facet spurring. Sinuses/Orbits: Bilateral cataract resection MR CIRCLE OF WILLIS FINDINGS Mild left vertebral artery dominance. Dominant right PICA and left AICA. No significant anatomic finding in the anterior circulation. The carotid, vertebral, and basilar arteries are smooth and widely patent. Atheromatous irregularity and narrowing primarily affecting the posterior cerebral arteries and most notably affecting the left P2 segment and upper branch left PCA. No carotid branch occlusion or flow limiting stenosis. Negative for aneurysm. MRA NECK FINDINGS Incidental subcutaneous lipoma in the left paramedian posterior neck measuring 4 cm. By time-of-flight technique there is antegrade flow in both carotid and vertebral arteries. Normal arch with 3 vessel branching. Carotid and vertebral arteries show no ulceration, stenosis, or beading. IMPRESSION: Brain MRI: 1. No emergent finding. 2. Atrophy and chronic small vessel ischemia. Intracranial MRA: No emergent finding. Atherosclerosis most notably affecting the posterior cerebral arteries with high-grade narrowing at the left P2 and P4 segments. Neck MRA: No emergent finding. No stenosis or ulceration of the major vessels. Electronically Signed   By: Monte Fantasia M.D.   On: 09/16/2020 07:27   MR BRAIN WO CONTRAST  Result Date: 09/16/2020 CLINICAL DATA:  Syncopal episode EXAM: MR HEAD WITHOUT CONTRAST MR CIRCLE OF WILLIS WITHOUT CONTRAST MRA OF THE NECK WITHOUT AND WITH CONTRAST TECHNIQUE: Multiplanar, multiecho pulse sequences of the brain, circle of willis and surrounding structures were obtained without intravenous contrast. Angiographic images of the neck were obtained using MRA technique without and with intravenous contrast. CONTRAST:  79mL GADAVIST GADOBUTROL 1 MMOL/ML IV SOLN COMPARISON:  Head  CT from yesterday and brain MRI 02/27/2018 FINDINGS: MR HEAD FINDINGS Brain: No acute infarction, hemorrhage, hydrocephalus, extra-axial collection or mass lesion. Chronic small vessel ischemia in the deep cerebral white matter. Chronic lacune at the left thalamus. Generalized atrophy. Vascular: See below.  Normal flow voids Skull and upper cervical spine: Normal marrow signal. Degenerative facet spurring. Sinuses/Orbits: Bilateral cataract resection MR CIRCLE OF WILLIS FINDINGS Mild left vertebral artery dominance. Dominant right PICA and left AICA. No significant anatomic finding in the anterior circulation. The carotid, vertebral, and basilar arteries are smooth and widely patent. Atheromatous irregularity and narrowing primarily affecting the posterior cerebral arteries and most notably affecting the left P2 segment and upper branch left PCA. No carotid branch occlusion or flow limiting stenosis. Negative for aneurysm. MRA NECK FINDINGS Incidental subcutaneous lipoma in the left paramedian posterior neck measuring 4 cm. By time-of-flight technique there is antegrade flow in both carotid and vertebral arteries. Normal arch with 3 vessel branching. Carotid and vertebral arteries show no ulceration, stenosis, or beading. IMPRESSION: Brain MRI: 1. No emergent finding. 2. Atrophy and chronic small vessel ischemia. Intracranial MRA: No emergent finding. Atherosclerosis most notably affecting the posterior cerebral arteries with high-grade narrowing at the left P2 and P4 segments. Neck MRA: No emergent finding. No stenosis or ulceration of the major vessels. Electronically Signed   By: Monte Fantasia M.D.   On: 09/16/2020 07:27   DG Knee Complete 4 Views Right  Result Date: 09/15/2020 CLINICAL DATA:  Pain after fall. EXAM: RIGHT KNEE - COMPLETE 4+ VIEW COMPARISON:  None. FINDINGS: No evidence of fracture, dislocation, or joint effusion. Tricompartment degenerative  change of the knee most advanced in the medial  compartment where it appears moderate to severe. Extensive vascular calcifications. IMPRESSION: 1. No acute osseous abnormality. 2. Tricompartment degenerative change of the knee, most advanced in the medial compartment. Electronically Signed   By: Dahlia Bailiff MD   On: 09/15/2020 21:24   DG Humerus Right  Result Date: 09/15/2020 CLINICAL DATA:  Arm pain after fall today. EXAM: RIGHT HUMERUS - 2+ VIEW COMPARISON:  Radiograph December 23, 2017 and August 05, 2018 FINDINGS: Reverse right total shoulder arthroplasty. There are well corticated osseous fragments along the inferior aspect of the humeral head which were not definitely visualized on chest radiograph August 05, 2018. There is no evidence of image shaft or distal humeral fracture. Degenerative change at the [facility] joint with inferior acromial spurring. Soft tissues are unremarkable. IMPRESSION: Well corticated osseous fragments along the inferior aspect of the humeral head which were not definitely visualized on chest radiograph August 05, 2018. Findings which may represent heterotopic ossification versus fracture fragments of uncertain chronicity. Recommend correlation with point tenderness and possible dedicated shoulder radiographs. Electronically Signed   By: Dahlia Bailiff MD   On: 09/15/2020 21:17   DG Hip Unilat W or Wo Pelvis 2-3 Views Right  Result Date: 09/15/2020 CLINICAL DATA:  Pain after fall. EXAM: DG HIP (WITH OR WITHOUT PELVIS) 2-3V RIGHT COMPARISON:  Radiograph June 05, 2017. FINDINGS: There is no evidence of hip fracture or dislocation. Mild degenerative change of the hips lower lumbar spine and pubic symphysis . IMPRESSION: No fracture or dislocation of the right hip. If there is high clinical suspicion for occult fracture or the patient refuses to weightbear, consider further evaluation with MRI or CT. Electronically Signed   By: Dahlia Bailiff MD   On: 09/15/2020 21:24     Medical Consultants:    None.   Subjective:     [name] she relates she feels better today.  Objective:    Vitals:   09/15/20 2330 09/16/20 0030 09/16/20 0138 09/16/20 0320  BP: 133/86 130/85 (!) 159/107 (!) 147/83  Pulse: 68  87 85  Resp:  20 20 20   Temp:  97.9 F (36.6 C) 98.1 F (36.7 C) 98.3 F (36.8 C)  TempSrc:  Oral Oral Oral  SpO2: 100% 100% 100% 99%  Weight:   98.2 kg   Height:   5\' 6"  (1.676 m)    SpO2: 99 % O2 Flow Rate (L/min): 2 L/min  No intake or output data in the 24 hours ending 09/16/20 0744 Filed Weights   09/15/20 2009 09/16/20 0138  Weight: 100.7 kg 98.2 kg    Exam: General exam: In no acute distress. Respiratory system: Good air movement and clear to auscultation. Cardiovascular system: S1 & S2 heard, RRR. No JVD. Gastrointestinal system: Abdomen is nondistended, soft and nontender.  Central nervous system: Alert and oriented. No focal neurological deficits. Extremities: No pedal edema. Skin: No rashes, lesions or ulcers Psychiatry: Judgement and insight appear normal. Mood & affect appropriate.    Data Reviewed:    Labs: Basic Metabolic Panel: Recent Labs  Lab 09/15/20 2038 09/16/20 0209  NA 137 138  K 3.3* 3.1*  CL 99 98  CO2 29 32  GLUCOSE 125* 98  BUN 19 16  CREATININE 1.32* 1.22*  CALCIUM 8.1* 8.5*  MG  --  2.1  PHOS  --  3.1   GFR Estimated Creatinine Clearance: 43.5 mL/min (A) (by C-G formula based on SCr of 1.22 mg/dL (H)).  Liver Function Tests: Recent Labs  Lab 09/15/20 2038 09/16/20 0209  AST 25 25  ALT 23 22  ALKPHOS 70 65  BILITOT 0.7 0.8  PROT 6.1* 5.9*  ALBUMIN 3.1* 3.1*   No results for input(s): LIPASE, AMYLASE in the last 168 hours. No results for input(s): AMMONIA in the last 168 hours. Coagulation profile Recent Labs  Lab 09/15/20 2038 09/16/20 0209  INR 1.6* 1.5*   COVID-19 Labs  No results for input(s): DDIMER, FERRITIN, LDH, CRP in the last 72 hours.  Lab Results  Component Value Date   [place] NEGATIVE  09/15/2020    CBC: Recent Labs  Lab 09/12/20 1317 09/15/20 2038 09/16/20 0209  WBC 9.7 9.8 9.1  NEUTROABS 6.0 6.5  --   HGB 15.2 13.8 14.5  HCT 45.8 42.9 44.4  MCV 90 92.1 90.8  PLT 294 225 224   Cardiac Enzymes: No results for input(s): CKTOTAL, CKMB, CKMBINDEX, TROPONINI in the last 168 hours. BNP (last 3 results) No results for input(s): PROBNP in the last 8760 hours. CBG: No results for input(s): GLUCAP in the last 168 hours. D-Dimer: No results for input(s): DDIMER in the last 72 hours. Hgb A1c: Recent Labs    09/16/20 0450  HGBA1C 6.4*   Lipid Profile: Recent Labs    09/16/20 0450  CHOL 152  HDL 40*  LDLCALC 96  TRIG 78  CHOLHDL 3.8   Thyroid function studies: No results for input(s): TSH, T4TOTAL, T3FREE, THYROIDAB in the last 72 hours.  Invalid input(s): FREET3 Anemia work up: No results for input(s): VITAMINB12, FOLATE, FERRITIN, TIBC, IRON, RETICCTPCT in the last 72 hours. Sepsis Labs: Recent Labs  Lab 09/12/20 1317 09/15/20 2038 09/16/20 0209  WBC 9.7 9.8 9.1   Microbiology Recent Results (from the past 240 hour(s))  Microscopic Examination     Status: Abnormal   Collection Time: 09/07/20 10:40 AM   Urine  Result Value Ref Range Status   WBC, UA 11-30 (A) 0 - 5 /hpf Final   RBC None seen 0 - 2 /hpf Final   Epithelial Cells (non renal) 0-10 0 - 10 /hpf Final   Bacteria, UA Few (A) None seen/Few Final  Microscopic Examination     Status: Abnormal   Collection Time: 09/12/20  1:21 PM   Urine  Result Value Ref Range Status   WBC, UA >30 (A) 0 - 5 /hpf Final   RBC 0-2 0 - 2 /hpf Final   Epithelial Cells (non renal) None seen 0 - 10 /hpf Final   Bacteria, UA Many (A) None seen/Few Final  Urine Culture     Status: Abnormal   Collection Time: 09/12/20  4:14 PM   Specimen: Urine   UR  Result Value Ref Range Status   Urine Culture, Routine Final report (A)  Final   Organism ID, Bacteria Comment (A)  Final    Comment: Pseudomonas  aeruginosa Greater than 100,000 colony forming units per mL    ORGANISM ID, BACTERIA Comment  Final    Comment: Mixed urogenital flora 25,000-50,000 colony forming units per mL    Antimicrobial Susceptibility Comment  Final    Comment:       ** S = Susceptible; I = Intermediate; R = Resistant **                    P = Positive; N = Negative             MICS are expressed in micrograms per  mL    Antibiotic                 RSLT#1    RSLT#2    RSLT#3    RSLT#4 Amikacin                       S Cefepime                       S Ceftazidime                    S Ciprofloxacin                  S Gentamicin                     S Imipenem                       S Levofloxacin                   S Meropenem                      S Piperacillin                   S Ticarcillin                    S Tobramycin                     S   Resp Panel by RT-PCR (Flu A&B, Covid) Nasopharyngeal Swab     Status: None   Collection Time: 09/15/20  9:00 PM   Specimen: Nasopharyngeal Swab; Nasopharyngeal(NP) swabs in vial transport medium  Result Value Ref Range Status   SARS Coronavirus 2 by RT PCR NEGATIVE NEGATIVE Final    Comment: (NOTE) SARS-CoV-2 target nucleic acids are NOT DETECTED.  The SARS-CoV-2 RNA is generally detectable in upper respiratory specimens during the acute phase of infection. The lowest concentration of SARS-CoV-2 viral copies this assay can detect is 138 copies/mL. A negative result does not preclude SARS-Cov-2 infection and should not be used as the sole basis for treatment or other patient management decisions. A negative result may occur with  improper specimen collection/handling, submission of specimen other than nasopharyngeal swab, presence of viral mutation(s) within the areas targeted by this assay, and inadequate number of viral copies(<138 copies/mL). A negative result must be combined with clinical observations, patient history, and epidemiological information. The  expected result is Negative.  Fact Sheet for Patients:  EntrepreneurPulse.com.au  Fact Sheet for Healthcare Providers:  IncredibleEmployment.be  This test is no t yet approved or cleared by the [place] FDA and  has been authorized for detection and/or diagnosis of SARS-CoV-2 by FDA under an Emergency Use Authorization (EUA). This EUA will remain  in effect (meaning this test can be used) for the duration of the COVID-19 declaration under Section 564(b)(1) of the Act, 21 U.S.C.section 360bbb-3(b)(1), unless the authorization is terminated  or revoked sooner.       Influenza A by PCR NEGATIVE NEGATIVE Final   Influenza B by PCR NEGATIVE NEGATIVE Final    Comment: (NOTE) The Xpert Xpress SARS-CoV-2/FLU/RSV plus assay is intended as an aid in the diagnosis of influenza from Nasopharyngeal swab specimens and should not be used as a sole basis for treatment. Nasal washings and aspirates are unacceptable  for Xpert Xpress SARS-CoV-2/FLU/RSV testing.  Fact Sheet for Patients: EntrepreneurPulse.com.au  Fact Sheet for Healthcare Providers: IncredibleEmployment.be  This test is not yet approved or cleared by the [place] FDA and has been authorized for detection and/or diagnosis of SARS-CoV-2 by FDA under an Emergency Use Authorization (EUA). This EUA will remain in effect (meaning this test can be used) for the duration of the COVID-19 declaration under Section 564(b)(1) of the Act, 21 U.S.C. section 360bbb-3(b)(1), unless the authorization is terminated or revoked.  Performed at [facility], [address], [place], [place] [address]      Medications:   . bacitracin  1 application Topical BID  . Tdap  0.5 mL Intramuscular Once   Continuous Infusions:    LOS: 0 days   Charlynne Cousins  [facility]  09/16/2020, 7:44 AM

## 2020-09-17 ENCOUNTER — Inpatient Hospital Stay (HOSPITAL_COMMUNITY): Payer: Medicare Other

## 2020-09-17 DIAGNOSIS — I10 Essential (primary) hypertension: Secondary | ICD-10-CM | POA: Diagnosis not present

## 2020-09-17 DIAGNOSIS — E8809 Other disorders of plasma-protein metabolism, not elsewhere classified: Secondary | ICD-10-CM | POA: Diagnosis not present

## 2020-09-17 DIAGNOSIS — I639 Cerebral infarction, unspecified: Secondary | ICD-10-CM | POA: Diagnosis not present

## 2020-09-17 DIAGNOSIS — I4821 Permanent atrial fibrillation: Secondary | ICD-10-CM | POA: Diagnosis not present

## 2020-09-17 DIAGNOSIS — E876 Hypokalemia: Secondary | ICD-10-CM | POA: Diagnosis not present

## 2020-09-17 LAB — BASIC METABOLIC PANEL
Anion gap: 6 (ref 5–15)
BUN: 10 mg/dL (ref 8–23)
CO2: 31 mmol/L (ref 22–32)
Calcium: 9.2 mg/dL (ref 8.9–10.3)
Chloride: 99 mmol/L (ref 98–111)
Creatinine, Ser: 0.95 mg/dL (ref 0.44–1.00)
GFR, Estimated: >60 mL/min (ref >60)
Glucose, Bld: 97 mg/dL (ref 70–99)
Potassium: 4.5 mmol/L (ref 3.5–5.1)
Sodium: 136 mmol/L (ref 135–145)

## 2020-09-17 MED ORDER — IOHEXOL 12 MG/ML PO SOLN: 500.0000 mL | ORAL | Status: DC

## 2020-09-17 MED ORDER — SODIUM CHLORIDE 0.9 % IV SOLN
2.0000 g | Freq: Three times a day (TID) | INTRAVENOUS | Status: DC
  Administered 2020-09-17 – 2020-09-18 (×2): 2 g via INTRAVENOUS
  Filled 2020-09-17 (×5): qty 2

## 2020-09-17 MED ORDER — IOHEXOL 300 MG/ML  SOLN
100.0000 mL | Freq: Once | INTRAMUSCULAR | Status: AC | PRN
  Administered 2020-09-17: 100 mL via INTRAVENOUS

## 2020-09-17 MED ORDER — SODIUM CHLORIDE 0.9 % IV SOLN: INTRAVENOUS | Status: DC

## 2020-09-17 MED ORDER — IOHEXOL 9 MG/ML PO SOLN
500.0000 mL | ORAL | Status: AC
  Administered 2020-09-17 (×2): 500 mL via ORAL

## 2020-09-17 MED ORDER — SODIUM CHLORIDE 0.9 % IV SOLN: INTRAVENOUS | Status: AC

## 2020-09-17 NOTE — Progress Notes (Signed)
STROKE CONSULT      Virginia Young is an 80 y.o. female.   ASSESSMENT/PLAN: 1.  Syncope likely due to orthostatic hypotension.  The etiology of the orthostatic hypotension is likely multifactorial including acute UTI and medication effect.  Medication culprits includes Seroquel in particular.  This medication will be reduced to 25 mg.  The patient should also receive physiotherapy.  If she remains orthostatic, she may have primary neurogenic orthostatic hypotension requiring pharmacotherapy possibly midodrine or Florinef. 2.  Right sided pain which I suspect is likely due to thalamic pain syndrome from her remote left thalamic infarct.  Started on Cymbalta without any side effects at this time.  Continue with dose escalation. 3.  Remote left thalamic infarct 4.  Chronic atrial fibrillation continue with anticoagulation with Eliquis.   She reports slightly improved right-sided pain.  No recurrent dizziness or syncope reported.    GENERAL: This a pleasant obese female who is doing well at this time.  HEENT: Neck is supple no trauma noted.  ABDOMEN: soft; no bruise or trauma is noted of the abdominal region.  THORAX; Also no evidence of bruising or trauma noted  EXTREMITIES: No edema; significant OA changes of the knees bilaterally.  Soreness involving the right arm related to falling with her syncopal episode.  BACK: Normal  SKIN: Normal by inspection.    MENTAL STATUS: Alert and oriented. Speech, language and cognition are generally intact. Judgment and insight normal.   CRANIAL NERVES: Pupils are equal, round and reactive to light and accomodation; extra ocular movements are full, there is no significant nystagmus; visual fields are full; upper and lower facial muscles are normal in strength and symmetric, there is no flattening of the nasolabial folds; tongue is midline; uvula is midline; shoulder elevation is normal.  MOTOR: There is mild drift of the right upper extremity  and right leg.  Normal tone, bulk and strength;   SENSATION: Significant hyperalgesia involving the right upper extremity and right side of the trunk.     Blood pressure (!) 115/91, pulse 79, temperature 98.1 F (36.7 C), resp. rate 18, height 5\' 6"  (1.676 m), weight 98.2 kg, SpO2 96 %.  Past Medical History:  Diagnosis Date  . Allergy   . Atrial fibrillation (HCC)    coumadin  . Cataract   . Chest pain 03/2012   a. Lex MV 10/13:  EF 64%, dist ant and apical defect sugg of soft tissue atten, no ischemia  . Chronic systolic heart failure ([place])   . Depression   . GERD (gastroesophageal reflux disease)   . HLD (hyperlipidemia)   . Mild mitral regurgitation   . MS (multiple sclerosis) ([place])   . NICM (nonischemic cardiomyopathy) ([place])    a. neg CLite in 2003;  b. EF 40-45% in past;   c.  Echo 12/11: EF 35-40%, mild MR, mild LAE, mild RAE, small pericardial effusion   . Osteoarthritis   . Osteoporosis   . Stasis ulcer ([place])   . Vaginal prolapse without uterine prolapse   . Varicose veins     Past Surgical History:  Procedure Laterality Date  . ANTERIOR AND POSTERIOR VAGINAL REPAIR W/ SACROSPINOUS LIGAMENT SUSPENSION  2016   [facility]  . BLADDER SURGERY    . CARPAL TUNNEL RELEASE Right 08/22/2017   Procedure: CARPAL TUNNEL RELEASE;  Surgeon: [name], MD;  Location: [facility];  Service: Orthopedics;  Laterality: Right;  . cataract surgery Bilateral   . EYE SURGERY    . INGUINAL HERNIA  REPAIR     right  . LIPOMA EXCISION     h/o removed from upper back x 2  . REVERSE SHOULDER ARTHROPLASTY Right 08/22/2017   Procedure: REVERSE SHOULDER ARTHROPLASTY;  Surgeon: [name], MD;  Location: [facility];  Service: Orthopedics;  Laterality: Right;    Family History  Problem Relation Age of Onset  . Diabetes Father 73       diabetes and syncope  . Heart attack Father   . Heart attack Sister   . Heart attack Brother        all 5 brothers had MIs  . Stroke Brother   . Cancer Sister         bone  . Diabetes Sister   . Cancer Sister   . Diabetes Sister   . Heart attack Brother   . Dementia Mother   . Dementia Brother   . CAD Other   . Diabetes Daughter   . Colon cancer Neg Hx   . Neuropathy Neg Hx     Social History:  reports that she has never smoked. She has never used smokeless tobacco. She reports that she does not drink alcohol and does not use drugs.  Allergies:  Allergies  Allergen Reactions  . Bactrim [Sulfamethoxazole-Trimethoprim]     Mouth sores  . Lipitor [Atorvastatin Calcium]     myalgia  . Lisinopril     cough    Medications: Prior to Admission medications   Medication Sig Start Date End Date Taking? Authorizing Provider  amoxicillin-clavulanate (AUGMENTIN) 500-125 MG tablet Take 1 tablet by mouth in the morning and at bedtime.   Yes [provider]  [employer] D3 25 MCG (1000 UT) capsule TAKE 1 CAPSULE (1,000 UNITS TOTAL) BY MOUTH DAILY. 09/27/19  Yes Rakes, Connye Burkitt, FNP  diclofenac Sodium (VOLTAREN) 1 % GEL Apply 2 g topically daily as needed (For pain).   Yes [provider]  divalproex (DEPAKOTE ER) 250 MG 24 hr tablet Take 1 tablet (250 mg total) by mouth daily. 12/22/19  Yes Lomax, Amy, NP  ELIQUIS 5 MG TABS tablet Take 1 tablet (5 mg total) by mouth every 12 (twelve) hours. 08/17/20  Yes Hawks, Christy A, FNP  furosemide (LASIX) 20 MG tablet Take 3 tablets (60 mg total) by mouth daily. 11/10/19  Yes Minus Breeding, MD  Incontinence Supply Disposable (SELECT DISPOSABLE UNDERWEAR LG) MISC 1 application by Does not apply route in the morning and at bedtime. 08/22/20  Yes Hawks, Christy A, FNP  ketoconazole (NIZORAL) 2 % cream Apply 1 application topically daily. 08/11/20  Yes Hawks, Theador Hawthorne, FNP  linaclotide (LINZESS) 72 MCG capsule Take 1 capsule (72 mcg total) by mouth daily before breakfast. 01/19/20  Yes Hawks, Christy A, FNP  losartan (COZAAR) 25 MG tablet TAKE 1/2 TABLET BY MOUTH EVERY DAY Patient taking differently: Take 12.5 mg by  mouth daily. 09/21/19  Yes Minus Breeding, MD  memantine (NAMENDA) 5 MG tablet Take 1 tablet (5 mg total) by mouth 2 (two) times daily. 11/24/19  Yes Lomax, Amy, NP  metoprolol tartrate (LOPRESSOR) 25 MG tablet Take 1 tablet (25 mg total) by mouth 2 (two) times daily. 09/06/19 01/12/20 Yes Minus Breeding, MD  Multiple Vitamin (MULTIVITAMIN) tablet Take 1 tablet by mouth daily.   Yes [provider]  nystatin (MYCOSTATIN/NYSTOP) powder Apply 1 application topically 3 (three) times daily. 08/11/20  Yes Hawks, Christy A, FNP  ondansetron (ZOFRAN) 4 MG tablet Take 1 tablet (4 mg total) by mouth every 8 (  eight) hours as needed for nausea or vomiting. 09/12/20  Yes Hawks, [name] A, FNP  OXYGEN Inhale into the lungs. 2 liters PRN - anytime when needed ( cardio rx'd)   Yes [provider]  QUEtiapine (SEROQUEL) 200 MG tablet Take 1 tablet (200 mg total) by mouth at bedtime. Patient taking differently: Take 200 mg by mouth daily. 09/12/20  Yes Hawks, Christy A, FNP  traMADol (ULTRAM) 50 MG tablet Take 1-2 tablets (50-100 mg total) by mouth every 12 (twelve) hours as needed. 09/05/20   Sharion Balloon, FNP    Scheduled Meds: . apixaban  5 mg Oral Q12H  . bacitracin  1 application Topical BID  . divalproex  250 mg Oral Daily  . DULoxetine  30 mg Oral Daily  . [START ON 09/30/2020] DULoxetine  30 mg Oral BID  . linaclotide  72 mcg Oral QAC breakfast  . memantine  5 mg Oral BID  . metoprolol tartrate  25 mg Oral BID  . QUEtiapine  25 mg Oral QHS  . Tdap  0.5 mL Intramuscular Once   Continuous Infusions: . sodium chloride    . cefTAZidime (FORTAZ)  IV     PRN Meds:.ondansetron, traMADol     Results for orders placed or performed during the hospital encounter of 09/15/20 (from the past 48 hour(s))  Ethanol     Status: None   Collection Time: 09/15/20  8:37 PM  Result Value Ref Range   Alcohol, Ethyl (B) <10 <10 mg/dL    Comment: (NOTE) Lowest detectable limit for serum alcohol is  10 mg/dL.  For medical purposes only. Performed at [facility], [address], [place], [place] [address]   Protime-INR     Status: Abnormal   Collection Time: 09/15/20  8:38 PM  Result Value Ref Range   Prothrombin Time 18.2 (H) 11.4 - 15.2 seconds   INR 1.6 (H) 0.8 - 1.2    Comment: (NOTE) INR goal varies based on device and disease states. Performed at [facility], [address], [place], [place] [address]   APTT     Status: None   Collection Time: 09/15/20  8:38 PM  Result Value Ref Range   aPTT 32 24 - 36 seconds    Comment: Performed at [facility], [address], [place], [place] [address]  CBC     Status: None   Collection Time: 09/15/20  8:38 PM  Result Value Ref Range   WBC 9.8 4.0 - 10.5 K/uL   RBC 4.66 3.87 - 5.11 MIL/uL   Hemoglobin 13.8 12.0 - 15.0 g/dL   HCT 42.9 36.0 - 46.0 %   MCV 92.1 80.0 - 100.0 fL   MCH 29.6 26.0 - 34.0 pg   MCHC 32.2 30.0 - 36.0 g/dL   RDW 13.9 11.5 - 15.5 %   Platelets 225 150 - 400 K/uL   nRBC 0.0 0.0 - 0.2 %    Comment: Performed at [facility], [address] [place], [place] [address]  Differential     Status: Abnormal   Collection Time: 09/15/20  8:38 PM  Result Value Ref Range   Neutrophils Relative % 66 %   Neutro Abs 6.5 1.7 - 7.7 K/uL   Lymphocytes Relative 19 %   Lymphs Abs 1.9 0.7 - 4.0 K/uL   Monocytes Relative 10 %   Monocytes Absolute 1.0 0.1 - 1.0 K/uL   Eosinophils Relative 3 %   Eosinophils Absolute 0.3 0.0 - 0.5 K/uL   Basophils Relative  1 %   Basophils Absolute 0.1 0.0 - 0.1 K/uL   Immature Granulocytes 1 %   Abs Immature Granulocytes 0.08 (H) 0.00 - 0.07 K/uL    Comment: Performed at [facility], [address], [place], [place] [address]  Comprehensive metabolic panel     Status: Abnormal   Collection Time: 09/15/20  8:38 PM  Result Value Ref Range   Sodium 137 135 - 145 mmol/L   Potassium 3.3 (L) 3.5 - 5.1 mmol/L   Chloride 99 98 - 111 mmol/L   CO2 29 22 - 32 mmol/L   Glucose, Bld 125 (H) 70 - 99  mg/dL    Comment: Glucose reference range applies only to samples taken after fasting for at least 8 hours.   BUN 19 8 - 23 mg/dL   Creatinine, Ser 1.32 (H) 0.44 - 1.00 mg/dL   Calcium 8.1 (L) 8.9 - 10.3 mg/dL   Total Protein 6.1 (L) 6.5 - 8.1 g/dL   Albumin 3.1 (L) 3.5 - 5.0 g/dL   AST 25 15 - 41 U/L   ALT 23 0 - 44 U/L   Alkaline Phosphatase 70 38 - 126 U/L   Total Bilirubin 0.7 0.3 - 1.2 mg/dL   GFR, Estimated 41 (L) >60 mL/min    Comment: (NOTE) Calculated using the CKD-EPI Creatinine Equation (2021)    Anion gap 9 5 - 15    Comment: Performed at [facility], [address], [place], [place] [address]  Valproic acid level     Status: Abnormal   Collection Time: 09/15/20  8:38 PM  Result Value Ref Range   Valproic Acid Lvl 25 (L) 50.0 - 100.0 ug/mL    Comment: Performed at [facility], [address] [place], [place] [address]  Resp Panel by RT-PCR (Flu A&B, Covid) Nasopharyngeal Swab     Status: None   Collection Time: 09/15/20  9:00 PM   Specimen: Nasopharyngeal Swab; Nasopharyngeal(NP) swabs in vial transport medium  Result Value Ref Range   SARS Coronavirus 2 by RT PCR NEGATIVE NEGATIVE    Comment: (NOTE) SARS-CoV-2 target nucleic acids are NOT DETECTED.  The SARS-CoV-2 RNA is generally detectable in upper respiratory specimens during the acute phase of infection. The lowest concentration of SARS-CoV-2 viral copies this assay can detect is 138 copies/mL. A negative result does not preclude SARS-Cov-2 infection and should not be used as the sole basis for treatment or other patient management decisions. A negative result may occur with  improper specimen collection/handling, submission of specimen other than nasopharyngeal swab, presence of viral mutation(s) within the areas targeted by this assay, and inadequate number of viral copies(<138 copies/mL). A negative result must be combined with clinical observations, patient history, and epidemiological information. The  expected result is Negative.  Fact Sheet for Patients:  EntrepreneurPulse.com.au  Fact Sheet for Healthcare Providers:  IncredibleEmployment.be  This test is no t yet approved or cleared by the [place] FDA and  has been authorized for detection and/or diagnosis of SARS-CoV-2 by FDA under an Emergency Use Authorization (EUA). This EUA will remain  in effect (meaning this test can be used) for the duration of the COVID-19 declaration under Section 564(b)(1) of the Act, 21 U.S.C.section 360bbb-3(b)(1), unless the authorization is terminated  or revoked sooner.       Influenza A by PCR NEGATIVE NEGATIVE   Influenza B by PCR NEGATIVE NEGATIVE    Comment: (NOTE) The Xpert Xpress SARS-CoV-2/FLU/RSV plus assay is intended as an aid in the diagnosis  of influenza from Nasopharyngeal swab specimens and should not be used as a sole basis for treatment. Nasal washings and aspirates are unacceptable for Xpert Xpress SARS-CoV-2/FLU/RSV testing.  Fact Sheet for Patients: EntrepreneurPulse.com.au  Fact Sheet for Healthcare Providers: IncredibleEmployment.be  This test is not yet approved or cleared by the [place] FDA and has been authorized for detection and/or diagnosis of SARS-CoV-2 by FDA under an Emergency Use Authorization (EUA). This EUA will remain in effect (meaning this test can be used) for the duration of the COVID-19 declaration under Section 564(b)(1) of the Act, 21 U.S.C. section 360bbb-3(b)(1), unless the authorization is terminated or revoked.  Performed at [facility], [address], [place], [place] [address]   Comprehensive metabolic panel     Status: Abnormal   Collection Time: 09/16/20  2:09 AM  Result Value Ref Range   Sodium 138 135 - 145 mmol/L   Potassium 3.1 (L) 3.5 - 5.1 mmol/L   Chloride 98 98 - 111 mmol/L   CO2 32 22 - 32 mmol/L   Glucose, Bld 98 70 - 99 mg/dL    Comment:  Glucose reference range applies only to samples taken after fasting for at least 8 hours.   BUN 16 8 - 23 mg/dL   Creatinine, Ser 1.22 (H) 0.44 - 1.00 mg/dL   Calcium 8.5 (L) 8.9 - 10.3 mg/dL   Total Protein 5.9 (L) 6.5 - 8.1 g/dL   Albumin 3.1 (L) 3.5 - 5.0 g/dL   AST 25 15 - 41 U/L   ALT 22 0 - 44 U/L   Alkaline Phosphatase 65 38 - 126 U/L   Total Bilirubin 0.8 0.3 - 1.2 mg/dL   GFR, Estimated 45 (L) >60 mL/min    Comment: (NOTE) Calculated using the CKD-EPI Creatinine Equation (2021)    Anion gap 8 5 - 15    Comment: Performed at [facility], [place] [address], [place], [place] [address]  CBC     Status: None   Collection Time: 09/16/20  2:09 AM  Result Value Ref Range   WBC 9.1 4.0 - 10.5 K/uL   RBC 4.89 3.87 - 5.11 MIL/uL   Hemoglobin 14.5 12.0 - 15.0 g/dL   HCT 44.4 36.0 - 46.0 %   MCV 90.8 80.0 - 100.0 fL   MCH 29.7 26.0 - 34.0 pg   MCHC 32.7 30.0 - 36.0 g/dL   RDW 13.9 11.5 - 15.5 %   Platelets 224 150 - 400 K/uL   nRBC 0.0 0.0 - 0.2 %    Comment: Performed at [facility], [place] [address], [place], [place] [address]  Protime-INR     Status: Abnormal   Collection Time: 09/16/20  2:09 AM  Result Value Ref Range   Prothrombin Time 17.3 (H) 11.4 - 15.2 seconds   INR 1.5 (H) 0.8 - 1.2    Comment: (NOTE) INR goal varies based on device and disease states. Performed at [facility], [place] [address], [place], [place] [address]   APTT     Status: None   Collection Time: 09/16/20  2:09 AM  Result Value Ref Range   aPTT 32 24 - 36 seconds    Comment: Performed at [facility] [address] [place], [place] [address]  Magnesium     Status: None   Collection Time: 09/16/20  2:09 AM  Result Value Ref Range   Magnesium 2.1 1.7 - 2.4 mg/dL    Comment: Performed at [facility], [place] [address] [place], [place] [address]  Phosphorus     Status: None   Collection Time: 09/16/20  2:09 AM  Result Value Ref Range   Phosphorus 3.1 2.5 - 4.6 mg/dL     Comment: Performed at [facility] [address] [place], [place] [address]  Urine rapid drug screen (hosp performed)     Status: None   Collection Time: 09/16/20  4:30 AM  Result Value Ref Range   Opiates NONE DETECTED NONE DETECTED   Cocaine NONE DETECTED NONE DETECTED   Benzodiazepines NONE DETECTED NONE DETECTED   Amphetamines NONE DETECTED NONE DETECTED   Tetrahydrocannabinol NONE DETECTED NONE DETECTED   Barbiturates NONE DETECTED NONE DETECTED    Comment: (NOTE) DRUG SCREEN FOR MEDICAL PURPOSES ONLY.  IF CONFIRMATION IS NEEDED FOR ANY PURPOSE, NOTIFY LAB WITHIN 5 DAYS.  LOWEST DETECTABLE LIMITS FOR URINE DRUG SCREEN Drug Class                     Cutoff (ng/mL) Amphetamine and metabolites    1000 Barbiturate and metabolites    200 Benzodiazepine                 709 Tricyclics and metabolites     300 Opiates and metabolites        300 Cocaine and metabolites        300 THC                            50 Performed at [facility], [place] [address] [place], [place] [address]   Urinalysis, Routine w reflex microscopic Urine, Clean Catch     Status: Abnormal   Collection Time: 09/16/20  4:30 AM  Result Value Ref Range   Color, Urine YELLOW YELLOW   APPearance HAZY (A) CLEAR   Specific Gravity, Urine 1.009 1.005 - 1.030   pH 7.0 5.0 - 8.0   Glucose, UA NEGATIVE NEGATIVE mg/dL   Hgb urine dipstick SMALL (A) NEGATIVE   Bilirubin Urine NEGATIVE NEGATIVE   Ketones, ur 5 (A) NEGATIVE mg/dL   Protein, ur NEGATIVE NEGATIVE mg/dL   Nitrite NEGATIVE NEGATIVE   Leukocytes,Ua LARGE (A) NEGATIVE   RBC / HPF 6-10 0 - 5 RBC/hpf   WBC, UA >50 (H) 0 - 5 WBC/hpf   Bacteria, UA RARE (A) NONE SEEN   Squamous Epithelial / LPF 0-5 0 - 5    Comment: Performed at [facility], [address] [place], [place] [address]  Lipid panel     Status: Abnormal   Collection Time: 09/16/20  4:50 AM  Result Value Ref Range   Cholesterol 152 0 - 200 mg/dL   Triglycerides 78  <150 mg/dL   HDL 40 (L) >40 mg/dL   Total CHOL/HDL Ratio 3.8 RATIO   VLDL 16 0 - 40 mg/dL   LDL Cholesterol 96 0 - 99 mg/dL    Comment:        Total Cholesterol/HDL:CHD Risk Coronary Heart Disease Risk Table                     Men   Women  1/2 Average Risk   3.4   3.3  Average Risk       5.0   4.4  2 X Average Risk   9.6   7.1  3 X Average Risk  23.4   11.0  Use the calculated Patient Ratio above and the CHD Risk Table to determine the patient's CHD Risk.        ATP III CLASSIFICATION (LDL):  <100     mg/dL   Optimal  100-129  mg/dL   Near or Above                    Optimal  130-159  mg/dL   Borderline  160-189  mg/dL   High  >190     mg/dL   Very High Performed at [facility] [address] [place], [place] [address]   Hemoglobin A1c     Status: Abnormal   Collection Time: 09/16/20  4:50 AM  Result Value Ref Range   Hgb A1c MFr Bld 6.4 (H) 4.8 - 5.6 %    Comment: (NOTE) Pre diabetes:          5.7%-6.4%  Diabetes:              >6.4%  Glycemic control for   <7.0% adults with diabetes    Mean Plasma Glucose 136.98 mg/dL    Comment: Performed at [facility] [address] [place], [place] [address]  Basic metabolic panel     Status: None   Collection Time: 09/17/20  9:47 AM  Result Value Ref Range   Sodium 136 135 - 145 mmol/L   Potassium 4.5 3.5 - 5.1 mmol/L    Comment: DELTA CHECK NOTED   Chloride 99 98 - 111 mmol/L   CO2 31 22 - 32 mmol/L   Glucose, Bld 97 70 - 99 mg/dL    Comment: Glucose reference range applies only to samples taken after fasting for at least 8 hours.   BUN 10 8 - 23 mg/dL   Creatinine, Ser 0.95 0.44 - 1.00 mg/dL   Calcium 9.2 8.9 - 10.3 mg/dL   GFR, Estimated >60 >60 mL/min    Comment: (NOTE) Calculated using the CKD-EPI Creatinine Equation (2021)    Anion gap 6 5 - 15    Comment: Performed at [facility] [address], [place], [place] [address]    Studies/Results:   Head CT IMPRESSION: 1. No  acute intracranial abnormality. 2. Similar age-related global parenchymal volume loss and chronic small vessel white matter ischemic disease.   BRAIN MRI MRA NECK MRA IMPRESSION: Brain MRI:  1. No emergent finding. 2. Atrophy and chronic small vessel ischemia.  Intracranial MRA:  No emergent finding.  Atherosclerosis most notably affecting the posterior cerebral arteries with high-grade narrowing at the left P2 and P4 segments.  Neck MRA:  No emergent finding. No stenosis or ulceration of the major vessels.    We will sign off reconsult as needed.   Tayona Sarnowski A. Merlene Laughter, M.D.  Diplomate, Tax adviser of Psychiatry and Neurology ( Neurology). 09/17/2020, 10:54 AM

## 2020-09-17 NOTE — Progress Notes (Signed)
[facility] PROGRESS NOTE    Progress Note  DANIKAH BUDZIK  WNU:[id number] DOB: 09/07/1940 DOA: 09/15/2020 PCP: Sharion Balloon, FNP     Brief Narrative:   MONTANNA MCBAIN is an 80 y.o. female past medical history significant for A. fib on Eliquis, chronic systolic heart failure with an EF of 25% by 2D echo in October 2020 essential hypertension obesity recently treated for UTI as an outpatient with Augmentin.  Comes into the emergency room for lightheadedness and suspect syncope walking with her walker in the kitchen she flatly felt dizzy and passed out forward hitting her head she relates it was about a minute. In the ED she was found hemodynamically stable  Significant studies: Right knee x-ray showed no fractures. Right hip x-ray showed no fracture or dislocation. Right humerus x-ray showed heterotropic ossification versus fracture of uncertain chronicity. CT of the head showed no acute findings. 09/16/2020 MRI of the brain showed no acute findings atrophic and chronic small vessel disease. 09/16/2020 MRI of the head showed no significant stenosis.  Antibiotics: None  Microbiology data: Blood culture:  Procedures: None  Assessment/Plan:   Syncope due to orthostatic hypotesion: MRI of the brain showed no acute findings. 2D echo EF 03%, no diastolic dysfunction. Telemetry shows paroxysmal atrial fibrillation. Twelve-lead EKG shows A. fib/a flutter. She relates a large history of dizziness upon standing, with a recent urinary tract infection. We will go ahead and start her on IV fluids check orthostatic vitals stat. Hold all antihypertensive medication, except for metoprolol. Ct abd and pelvis.  Right-sided weakness: MRI negative for stroke. Physical therapy and Occupational Therapy evaluation are pending. Allow a diet.  Recurrent UTI status post treatment: Urine culture on 09/12/2020 was positive for Pseudomonas, discontinue amoxicillin and start her on  IV Fortaz.  Hypokalemia: Likely due to hypovolemia: Basic metabolic panel pending   Hypoalbuminemia secondary to milk protein caloric malnutrition: Continue supplements at home.  Chronic atrial fibrillation: Rate controlled on Eliquis.  Right arm abrasion due to accidental fall: Continue wound care. Physical therapy has been consulted.  Essential hypertension: Hold all antihypertensive medication except for metoprolol.  Moderate dementia: Continue Namenda.   DVT prophylaxis: lovenox Family Communication:none Status is: Observation  The patient will require care spanning > 2 midnights and should be moved to inpatient because: Hemodynamically unstable  Dispo: The patient is from: Home              Anticipated d/c is to: SNF              Patient currently is not medically stable to d/c.   Difficult to place patient No  Code Status:     Code Status Orders  (From admission, onward)         Start     Ordered   09/16/20 0147  Full code  Continuous        09/16/20 0146        Code Status History    Date Active Date Inactive Code Status Order ID Comments User Context   08/22/2017 1234 08/25/2017 2232 Full Code [id number]  [name], MD Inpatient   05/06/2013 1631 05/10/2013 1653 Full Code [id number]  [name], MD ED   Advance Care Planning Activity    Advance Directive Documentation   Flowsheet Row Most Recent Value  Type of Advance Directive Healthcare Power of Attorney  Pre-existing out of facility DNR order (yellow form or pink MOST form) -  "MOST" Form in Place? -  IV Access:    Peripheral IV   Procedures and diagnostic studies:   CT HEAD WO CONTRAST  Result Date: 09/15/2020 CLINICAL DATA:  Neuro deficit, stroke suspected, right-sided numbness. EXAM: CT HEAD WITHOUT CONTRAST TECHNIQUE: Contiguous axial images were obtained from the base of the skull through the vertex without intravenous contrast. COMPARISON:  MRI brain February 27, 2018  and head CT July 25, 2016 FINDINGS: Brain: No evidence of acute large vascular territory infarction, hemorrhage, hydrocephalus, extra-axial collection or mass lesion/mass effect. Age related global parenchymal volume loss with ex vacuo dilatation of ventricular system, not significantly changed from prior. Similar mild to moderate burden of chronic small vessel white matter ischemic disease. Vascular: No hyperdense vessel. Atherosclerotic calcifications of the intracranial portions of the internal carotid arteries. Skull: Normal. Negative for fracture or focal lesion. Sinuses/Orbits: Paranasal sinuses are predominantly clear. Orbits are unremarkable. Other: Streak artifact from dental hardware. Cerumen in the left external auditory canal. IMPRESSION: 1. No acute intracranial abnormality. 2. Similar age-related global parenchymal volume loss and chronic small vessel white matter ischemic disease. Electronically Signed   By: Dahlia Bailiff MD   On: 09/15/2020 20:58   MR ANGIO HEAD WO CONTRAST  Result Date: 09/16/2020 CLINICAL DATA:  Syncopal episode EXAM: MR HEAD WITHOUT CONTRAST MR CIRCLE OF WILLIS WITHOUT CONTRAST MRA OF THE NECK WITHOUT AND WITH CONTRAST TECHNIQUE: Multiplanar, multiecho pulse sequences of the brain, circle of willis and surrounding structures were obtained without intravenous contrast. Angiographic images of the neck were obtained using MRA technique without and with intravenous contrast. CONTRAST:  40mL GADAVIST GADOBUTROL 1 MMOL/ML IV SOLN COMPARISON:  Head CT from yesterday and brain MRI 02/27/2018 FINDINGS: MR HEAD FINDINGS Brain: No acute infarction, hemorrhage, hydrocephalus, extra-axial collection or mass lesion. Chronic small vessel ischemia in the deep cerebral white matter. Chronic lacune at the left thalamus. Generalized atrophy. Vascular: See below.  Normal flow voids Skull and upper cervical spine: Normal marrow signal. Degenerative facet spurring. Sinuses/Orbits: Bilateral  cataract resection MR CIRCLE OF WILLIS FINDINGS Mild left vertebral artery dominance. Dominant right PICA and left AICA. No significant anatomic finding in the anterior circulation. The carotid, vertebral, and basilar arteries are smooth and widely patent. Atheromatous irregularity and narrowing primarily affecting the posterior cerebral arteries and most notably affecting the left P2 segment and upper branch left PCA. No carotid branch occlusion or flow limiting stenosis. Negative for aneurysm. MRA NECK FINDINGS Incidental subcutaneous lipoma in the left paramedian posterior neck measuring 4 cm. By time-of-flight technique there is antegrade flow in both carotid and vertebral arteries. Normal arch with 3 vessel branching. Carotid and vertebral arteries show no ulceration, stenosis, or beading. IMPRESSION: Brain MRI: 1. No emergent finding. 2. Atrophy and chronic small vessel ischemia. Intracranial MRA: No emergent finding. Atherosclerosis most notably affecting the posterior cerebral arteries with high-grade narrowing at the left P2 and P4 segments. Neck MRA: No emergent finding. No stenosis or ulceration of the major vessels. Electronically Signed   By: Monte Fantasia M.D.   On: 09/16/2020 07:27   MR ANGIO NECK W WO CONTRAST  Result Date: 09/16/2020 CLINICAL DATA:  Syncopal episode EXAM: MR HEAD WITHOUT CONTRAST MR CIRCLE OF WILLIS WITHOUT CONTRAST MRA OF THE NECK WITHOUT AND WITH CONTRAST TECHNIQUE: Multiplanar, multiecho pulse sequences of the brain, circle of willis and surrounding structures were obtained without intravenous contrast. Angiographic images of the neck were obtained using MRA technique without and with intravenous contrast. CONTRAST:  44mL GADAVIST GADOBUTROL 1 MMOL/ML IV SOLN COMPARISON:  Head CT from yesterday and brain MRI 02/27/2018 FINDINGS: MR HEAD FINDINGS Brain: No acute infarction, hemorrhage, hydrocephalus, extra-axial collection or mass lesion. Chronic small vessel ischemia in the  deep cerebral white matter. Chronic lacune at the left thalamus. Generalized atrophy. Vascular: See below.  Normal flow voids Skull and upper cervical spine: Normal marrow signal. Degenerative facet spurring. Sinuses/Orbits: Bilateral cataract resection MR CIRCLE OF WILLIS FINDINGS Mild left vertebral artery dominance. Dominant right PICA and left AICA. No significant anatomic finding in the anterior circulation. The carotid, vertebral, and basilar arteries are smooth and widely patent. Atheromatous irregularity and narrowing primarily affecting the posterior cerebral arteries and most notably affecting the left P2 segment and upper branch left PCA. No carotid branch occlusion or flow limiting stenosis. Negative for aneurysm. MRA NECK FINDINGS Incidental subcutaneous lipoma in the left paramedian posterior neck measuring 4 cm. By time-of-flight technique there is antegrade flow in both carotid and vertebral arteries. Normal arch with 3 vessel branching. Carotid and vertebral arteries show no ulceration, stenosis, or beading. IMPRESSION: Brain MRI: 1. No emergent finding. 2. Atrophy and chronic small vessel ischemia. Intracranial MRA: No emergent finding. Atherosclerosis most notably affecting the posterior cerebral arteries with high-grade narrowing at the left P2 and P4 segments. Neck MRA: No emergent finding. No stenosis or ulceration of the major vessels. Electronically Signed   By: Monte Fantasia M.D.   On: 09/16/2020 07:27   MR BRAIN WO CONTRAST  Result Date: 09/16/2020 CLINICAL DATA:  Syncopal episode EXAM: MR HEAD WITHOUT CONTRAST MR CIRCLE OF WILLIS WITHOUT CONTRAST MRA OF THE NECK WITHOUT AND WITH CONTRAST TECHNIQUE: Multiplanar, multiecho pulse sequences of the brain, circle of willis and surrounding structures were obtained without intravenous contrast. Angiographic images of the neck were obtained using MRA technique without and with intravenous contrast. CONTRAST:  37mL GADAVIST GADOBUTROL 1  MMOL/ML IV SOLN COMPARISON:  Head CT from yesterday and brain MRI 02/27/2018 FINDINGS: MR HEAD FINDINGS Brain: No acute infarction, hemorrhage, hydrocephalus, extra-axial collection or mass lesion. Chronic small vessel ischemia in the deep cerebral white matter. Chronic lacune at the left thalamus. Generalized atrophy. Vascular: See below.  Normal flow voids Skull and upper cervical spine: Normal marrow signal. Degenerative facet spurring. Sinuses/Orbits: Bilateral cataract resection MR CIRCLE OF WILLIS FINDINGS Mild left vertebral artery dominance. Dominant right PICA and left AICA. No significant anatomic finding in the anterior circulation. The carotid, vertebral, and basilar arteries are smooth and widely patent. Atheromatous irregularity and narrowing primarily affecting the posterior cerebral arteries and most notably affecting the left P2 segment and upper branch left PCA. No carotid branch occlusion or flow limiting stenosis. Negative for aneurysm. MRA NECK FINDINGS Incidental subcutaneous lipoma in the left paramedian posterior neck measuring 4 cm. By time-of-flight technique there is antegrade flow in both carotid and vertebral arteries. Normal arch with 3 vessel branching. Carotid and vertebral arteries show no ulceration, stenosis, or beading. IMPRESSION: Brain MRI: 1. No emergent finding. 2. Atrophy and chronic small vessel ischemia. Intracranial MRA: No emergent finding. Atherosclerosis most notably affecting the posterior cerebral arteries with high-grade narrowing at the left P2 and P4 segments. Neck MRA: No emergent finding. No stenosis or ulceration of the major vessels. Electronically Signed   By: Monte Fantasia M.D.   On: 09/16/2020 07:27   DG Knee Complete 4 Views Right  Result Date: 09/15/2020 CLINICAL DATA:  Pain after fall. EXAM: RIGHT KNEE - COMPLETE 4+ VIEW COMPARISON:  None. FINDINGS: No evidence of fracture, dislocation, or joint effusion. Tricompartment degenerative  change of the  knee most advanced in the medial compartment where it appears moderate to severe. Extensive vascular calcifications. IMPRESSION: 1. No acute osseous abnormality. 2. Tricompartment degenerative change of the knee, most advanced in the medial compartment. Electronically Signed   By: Dahlia Bailiff MD   On: 09/15/2020 21:24   DG Humerus Right  Result Date: 09/15/2020 CLINICAL DATA:  Arm pain after fall today. EXAM: RIGHT HUMERUS - 2+ VIEW COMPARISON:  Radiograph December 23, 2017 and August 05, 2018 FINDINGS: Reverse right total shoulder arthroplasty. There are well corticated osseous fragments along the inferior aspect of the humeral head which were not definitely visualized on chest radiograph August 05, 2018. There is no evidence of image shaft or distal humeral fracture. Degenerative change at the [facility] joint with inferior acromial spurring. Soft tissues are unremarkable. IMPRESSION: Well corticated osseous fragments along the inferior aspect of the humeral head which were not definitely visualized on chest radiograph August 05, 2018. Findings which may represent heterotopic ossification versus fracture fragments of uncertain chronicity. Recommend correlation with point tenderness and possible dedicated shoulder radiographs. Electronically Signed   By: Dahlia Bailiff MD   On: 09/15/2020 21:17   ECHOCARDIOGRAM COMPLETE  Result Date: 09/16/2020    ECHOCARDIOGRAM REPORT   Patient Name:   THOMASINA HOUSLEY Date of Exam: 09/16/2020 Medical Rec #:  [id number]          Height:       66.0 in Accession #:    [id number]         Weight:       216.5 lb Date of Birth:  June 13, 1941          BSA:          2.068 m Patient Age:    62 years           BP:           147/83 mmHg Patient Gender: F                  HR:           69 bpm. Exam Location:  Inpatient Procedure: 2D Echo, Cardiac Doppler, Color Doppler and Intracardiac            Opacification Agent Indications:    Stroke I63.9  History:        Patient has prior history of  Echocardiogram examinations, most                 recent 08/13/2018. Cardiomyopathy and CHF, Arrythmias:Atrial                 Fibrillation; Risk Factors:Non-Smoker.  Sonographer:    Vickie Epley RDCS Referring Phys: [id number] OLADAPO ADEFESO IMPRESSIONS  1. Pt in atrial fibrillation during the study.  2. Left ventricular ejection fraction, by estimation, is 55 to 60%. The left ventricle has normal function. The left ventricle has no regional wall motion abnormalities. Left ventricular diastolic function could not be evaluated.  3. Right ventricular systolic function is normal. The right ventricular size is normal.  4. The mitral valve is normal in structure. No evidence of mitral valve regurgitation. No evidence of mitral stenosis.  5. The aortic valve is tricuspid. Aortic valve regurgitation is trivial. Mild aortic valve sclerosis is present, with no evidence of aortic valve stenosis.  6. Aortic dilatation noted. There is borderline dilatation of the aortic root, measuring 38 mm.  7. The inferior vena cava is normal in size with greater than  50% respiratory variability, suggesting right atrial pressure of 3 mmHg. FINDINGS  Left Ventricle: Left ventricular ejection fraction, by estimation, is 55 to 60%. The left ventricle has normal function. The left ventricle has no regional wall motion abnormalities. Definity contrast agent was given IV to delineate the left ventricular  endocardial borders. The left ventricular internal cavity size was normal in size. There is no left ventricular hypertrophy. Left ventricular diastolic function could not be evaluated due to atrial fibrillation. Left ventricular diastolic function could  not be evaluated. Right Ventricle: The right ventricular size is normal.Right ventricular systolic function is normal. Left Atrium: Left atrial size was normal in size. Right Atrium: Right atrial size was normal in size. Pericardium: Trivial pericardial effusion is present. Mitral Valve: The mitral  valve is normal in structure. No evidence of mitral valve regurgitation. No evidence of mitral valve stenosis. Tricuspid Valve: The tricuspid valve is normal in structure. Tricuspid valve regurgitation is trivial. No evidence of tricuspid stenosis. Aortic Valve: The aortic valve is tricuspid. Aortic valve regurgitation is trivial. Mild aortic valve sclerosis is present, with no evidence of aortic valve stenosis. Pulmonic Valve: The pulmonic valve was not well visualized. Pulmonic valve regurgitation is not visualized. No evidence of pulmonic stenosis. Aorta: Aortic dilatation noted. There is borderline dilatation of the aortic root, measuring 38 mm. Venous: The inferior vena cava is normal in size with greater than 50% respiratory variability, suggesting right atrial pressure of 3 mmHg.  Additional Comments: Pt in atrial fibrillation during the study.  LEFT VENTRICLE PLAX 2D LVIDd:         4.40 cm LVIDs:         3.70 cm LV PW:         0.90 cm LV IVS:        0.90 cm LVOT diam:     2.20 cm LV SV:         38 LV SV Index:   18 LVOT Area:     3.80 cm  LV Volumes (MOD) LV vol d, MOD A2C: 78.8 ml LV vol d, MOD A4C: 128.0 ml LV vol s, MOD A2C: 41.2 ml LV vol s, MOD A4C: 54.8 ml LV SV MOD A2C:     37.6 ml LV SV MOD A4C:     128.0 ml LV SV MOD BP:      58.7 ml RIGHT VENTRICLE RV S prime:     9.03 cm/s TAPSE (M-mode): 1.4 cm LEFT ATRIUM             Index       RIGHT ATRIUM           Index LA diam:        3.00 cm 1.45 cm/m  RA Area:     21.70 cm LA Vol (A2C):   65.5 ml 31.67 ml/m RA Volume:   61.60 ml  29.78 ml/m LA Vol (A4C):   45.9 ml 22.19 ml/m LA Biplane Vol: 56.9 ml 27.51 ml/m  AORTIC VALVE LVOT Vmax:   53.80 cm/s LVOT Vmean:  38.500 cm/s LVOT VTI:    0.100 m  AORTA Ao Root diam: 3.80 cm Ao Asc diam:  3.80 cm  SHUNTS Systemic VTI:  0.10 m Systemic Diam: 2.20 cm Kirk Ruths MD Electronically signed by Kirk Ruths MD Signature Date/Time: 09/16/2020/1:32:52 PM    Final    DG Hip Unilat W or Wo Pelvis 2-3 Views  Right  Result Date: 09/15/2020 CLINICAL DATA:  Pain after fall. EXAM: DG HIP (  WITH OR WITHOUT PELVIS) 2-3V RIGHT COMPARISON:  Radiograph June 05, 2017. FINDINGS: There is no evidence of hip fracture or dislocation. Mild degenerative change of the hips lower lumbar spine and pubic symphysis . IMPRESSION: No fracture or dislocation of the right hip. If there is high clinical suspicion for occult fracture or the patient refuses to weightbear, consider further evaluation with MRI or CT. Electronically Signed   By: Dahlia Bailiff MD   On: 09/15/2020 21:24     Medical Consultants:    None.   Subjective:    [name] feels better today  Objective:    Vitals:   09/16/20 1928 09/17/20 0028 09/17/20 0326 09/17/20 0810  BP: (!) 141/83 (!) 152/90 (!) 168/103 (!) 115/91  Pulse: 77 78 88 79  Resp: 19 20 19 18   Temp: 97.9 F (36.6 C) 97.6 F (36.4 C) 97.7 F (36.5 C) 98.1 F (36.7 C)  TempSrc: Oral Oral Oral   SpO2: 95% 98% 93% 96%  Weight:      Height:       SpO2: 96 % O2 Flow Rate (L/min): 2 L/min   Intake/Output Summary (Last 24 hours) at 09/17/2020 0819 Last data filed at 09/17/2020 0300 Gross per 24 hour  Intake -  Output 1000 ml  Net -1000 ml   Filed Weights   09/15/20 2009 09/16/20 0138  Weight: 100.7 kg 98.2 kg    Exam: General exam: In no acute distress. Respiratory system: Good air movement and clear to auscultation. Cardiovascular system: S1 & S2 heard, RRR. No JVD. Gastrointestinal system: Abdomen is nondistended, soft and nontender.  Extremities: No pedal edema. Skin: No rashes, lesions or ulcers Psychiatry: Judgement and insight appear normal. Mood & affect appropriate.   Data Reviewed:    Labs: Basic Metabolic Panel: Recent Labs  Lab 09/15/20 2038 09/16/20 0209  NA 137 138  K 3.3* 3.1*  CL 99 98  CO2 29 32  GLUCOSE 125* 98  BUN 19 16  CREATININE 1.32* 1.22*  CALCIUM 8.1* 8.5*  MG  --  2.1  PHOS  --  3.1   GFR Estimated  Creatinine Clearance: 43.5 mL/min (A) (by C-G formula based on SCr of 1.22 mg/dL (H)). Liver Function Tests: Recent Labs  Lab 09/15/20 2038 09/16/20 0209  AST 25 25  ALT 23 22  ALKPHOS 70 65  BILITOT 0.7 0.8  PROT 6.1* 5.9*  ALBUMIN 3.1* 3.1*   No results for input(s): LIPASE, AMYLASE in the last 168 hours. No results for input(s): AMMONIA in the last 168 hours. Coagulation profile Recent Labs  Lab 09/15/20 2038 09/16/20 0209  INR 1.6* 1.5*   COVID-19 Labs  No results for input(s): DDIMER, FERRITIN, LDH, CRP in the last 72 hours.  Lab Results  Component Value Date   [place] NEGATIVE 09/15/2020    CBC: Recent Labs  Lab 09/12/20 1317 09/15/20 2038 09/16/20 0209  WBC 9.7 9.8 9.1  NEUTROABS 6.0 6.5  --   HGB 15.2 13.8 14.5  HCT 45.8 42.9 44.4  MCV 90 92.1 90.8  PLT 294 225 224   Cardiac Enzymes: No results for input(s): CKTOTAL, CKMB, CKMBINDEX, TROPONINI in the last 168 hours. BNP (last 3 results) No results for input(s): PROBNP in the last 8760 hours. CBG: No results for input(s): GLUCAP in the last 168 hours. D-Dimer: No results for input(s): DDIMER in the last 72 hours. Hgb A1c: Recent Labs    09/16/20 0450  HGBA1C 6.4*   Lipid Profile: Recent Labs  09/16/20 0450  CHOL 152  HDL 40*  LDLCALC 96  TRIG 78  CHOLHDL 3.8   Thyroid function studies: No results for input(s): TSH, T4TOTAL, T3FREE, THYROIDAB in the last 72 hours.  Invalid input(s): FREET3 Anemia work up: No results for input(s): VITAMINB12, FOLATE, FERRITIN, TIBC, IRON, RETICCTPCT in the last 72 hours. Sepsis Labs: Recent Labs  Lab 09/12/20 1317 09/15/20 2038 09/16/20 0209  WBC 9.7 9.8 9.1   Microbiology Recent Results (from the past 240 hour(s))  Microscopic Examination     Status: Abnormal   Collection Time: 09/07/20 10:40 AM   Urine  Result Value Ref Range Status   WBC, UA 11-30 (A) 0 - 5 /hpf Final   RBC None seen 0 - 2 /hpf Final   Epithelial Cells (non renal)  0-10 0 - 10 /hpf Final   Bacteria, UA Few (A) None seen/Few Final  Microscopic Examination     Status: Abnormal   Collection Time: 09/12/20  1:21 PM   Urine  Result Value Ref Range Status   WBC, UA >30 (A) 0 - 5 /hpf Final   RBC 0-2 0 - 2 /hpf Final   Epithelial Cells (non renal) None seen 0 - 10 /hpf Final   Bacteria, UA Many (A) None seen/Few Final  Urine Culture     Status: Abnormal   Collection Time: 09/12/20  4:14 PM   Specimen: Urine   UR  Result Value Ref Range Status   Urine Culture, Routine Final report (A)  Final   Organism ID, Bacteria Comment (A)  Final    Comment: Pseudomonas aeruginosa Greater than 100,000 colony forming units per mL    ORGANISM ID, BACTERIA Comment  Final    Comment: Mixed urogenital flora 25,000-50,000 colony forming units per mL    Antimicrobial Susceptibility Comment  Final    Comment:       ** S = Susceptible; I = Intermediate; R = Resistant **                    P = Positive; N = Negative             MICS are expressed in micrograms per mL    Antibiotic                 RSLT#1    RSLT#2    RSLT#3    RSLT#4 Amikacin                       S Cefepime                       S Ceftazidime                    S Ciprofloxacin                  S Gentamicin                     S Imipenem                       S Levofloxacin                   S Meropenem                      S Piperacillin  S Ticarcillin                    S Tobramycin                     S   Resp Panel by RT-PCR (Flu A&B, Covid) Nasopharyngeal Swab     Status: None   Collection Time: 09/15/20  9:00 PM   Specimen: Nasopharyngeal Swab; Nasopharyngeal(NP) swabs in vial transport medium  Result Value Ref Range Status   SARS Coronavirus 2 by RT PCR NEGATIVE NEGATIVE Final    Comment: (NOTE) SARS-CoV-2 target nucleic acids are NOT DETECTED.  The SARS-CoV-2 RNA is generally detectable in upper respiratory specimens during the acute phase of infection. The  lowest concentration of SARS-CoV-2 viral copies this assay can detect is 138 copies/mL. A negative result does not preclude SARS-Cov-2 infection and should not be used as the sole basis for treatment or other patient management decisions. A negative result may occur with  improper specimen collection/handling, submission of specimen other than nasopharyngeal swab, presence of viral mutation(s) within the areas targeted by this assay, and inadequate number of viral copies(<138 copies/mL). A negative result must be combined with clinical observations, patient history, and epidemiological information. The expected result is Negative.  Fact Sheet for Patients:  EntrepreneurPulse.com.au  Fact Sheet for Healthcare Providers:  IncredibleEmployment.be  This test is no t yet approved or cleared by the [place] FDA and  has been authorized for detection and/or diagnosis of SARS-CoV-2 by FDA under an Emergency Use Authorization (EUA). This EUA will remain  in effect (meaning this test can be used) for the duration of the COVID-19 declaration under Section 564(b)(1) of the Act, 21 U.S.C.section 360bbb-3(b)(1), unless the authorization is terminated  or revoked sooner.       Influenza A by PCR NEGATIVE NEGATIVE Final   Influenza B by PCR NEGATIVE NEGATIVE Final    Comment: (NOTE) The Xpert Xpress SARS-CoV-2/FLU/RSV plus assay is intended as an aid in the diagnosis of influenza from Nasopharyngeal swab specimens and should not be used as a sole basis for treatment. Nasal washings and aspirates are unacceptable for Xpert Xpress SARS-CoV-2/FLU/RSV testing.  Fact Sheet for Patients: EntrepreneurPulse.com.au  Fact Sheet for Healthcare Providers: IncredibleEmployment.be  This test is not yet approved or cleared by the [place] FDA and has been authorized for detection and/or diagnosis of SARS-CoV-2 by FDA under  an Emergency Use Authorization (EUA). This EUA will remain in effect (meaning this test can be used) for the duration of the COVID-19 declaration under Section 564(b)(1) of the Act, 21 U.S.C. section 360bbb-3(b)(1), unless the authorization is terminated or revoked.  Performed at [facility], [address] [place], [place] [address]      Medications:   . apixaban  5 mg Oral Q12H  . bacitracin  1 application Topical BID  . divalproex  250 mg Oral Daily  . DULoxetine  30 mg Oral Daily  . [START ON 09/30/2020] DULoxetine  30 mg Oral BID  . linaclotide  72 mcg Oral QAC breakfast  . memantine  5 mg Oral BID  . metoprolol tartrate  25 mg Oral BID  . QUEtiapine  25 mg Oral QHS  . Tdap  0.5 mL Intramuscular Once   Continuous Infusions: . cefTAZidime (FORTAZ)  IV        LOS: 1 day   Charlynne Cousins  [facility]  09/17/2020, 8:18 AM

## 2020-09-17 NOTE — Progress Notes (Signed)
PHARMACY NOTE:  ANTIMICROBIAL RENAL DOSAGE ADJUSTMENT  Current antimicrobial regimen includes a mismatch between antimicrobial dosage and estimated renal function.  As per policy approved by the Pharmacy & Therapeutics and Medical Executive Committees, the antimicrobial dosage will be adjusted accordingly.  Current antimicrobial dosage:  Ceftazidime 1g IV q12h  Indication: UTI  Renal Function:  Estimated Creatinine Clearance: 55.8 mL/min (by C-G formula based on SCr of 0.95 mg/dL). []      On intermittent HD, scheduled: []      On CRRT    Antimicrobial dosage has been changed to:  Ceftazidime 2g IV q8h  Additional comments:   Thank you for allowing pharmacy to be a part of this patient's care.  Dimple Nanas, [facility] 09/17/2020 2:43 PM

## 2020-09-18 DIAGNOSIS — R55 Syncope and collapse: Secondary | ICD-10-CM | POA: Diagnosis not present

## 2020-09-18 LAB — BASIC METABOLIC PANEL
Anion gap: 4 — ABNORMAL LOW (ref 5–15)
BUN: 6 mg/dL — ABNORMAL LOW (ref 8–23)
CO2: 27 mmol/L (ref 22–32)
Calcium: 8.5 mg/dL — ABNORMAL LOW (ref 8.9–10.3)
Chloride: 98 mmol/L (ref 98–111)
Creatinine, Ser: 0.75 mg/dL (ref 0.44–1.00)
GFR, Estimated: >60 mL/min (ref >60)
Glucose, Bld: 96 mg/dL (ref 70–99)
Potassium: 4.5 mmol/L (ref 3.5–5.1)
Sodium: 129 mmol/L — ABNORMAL LOW (ref 135–145)

## 2020-09-18 MED ORDER — MIDODRINE HCL 5 MG PO TABS
2.5000 mg | ORAL_TABLET | Freq: Three times a day (TID) | ORAL | Status: DC
  Administered 2020-09-18 – 2020-09-19 (×3): 2.5 mg via ORAL
  Filled 2020-09-18 (×3): qty 1

## 2020-09-18 MED ORDER — CIPROFLOXACIN HCL 500 MG PO TABS
500.0000 mg | ORAL_TABLET | Freq: Two times a day (BID) | ORAL | Status: DC
  Administered 2020-09-18 – 2020-09-19 (×3): 500 mg via ORAL
  Filled 2020-09-18 (×3): qty 1

## 2020-09-18 NOTE — NC FL2 (Signed)
[place] MEDICAID FL2 LEVEL OF CARE SCREENING TOOL     IDENTIFICATION  Patient Name: Virginia Young Birthdate: 06-Apr-1941 Sex: female Admission Date (Current Location): 09/15/2020  [facility] and [place] Number:  [employer] and Address:  [employer], [place] [address], [place], [place] [address]      Provider Number: [id number]  Attending Physician Name and Address:  Charlynne Cousins, MD  Relative Name and Phone Number:       Current Level of Care: Hospital Recommended Level of Care: [place] Prior Approval Number:    Date Approved/Denied:   PASRR Number: [id number]  Discharge Plan: SNF    Current Diagnoses: Patient Active Problem List   Diagnosis Date Noted  . Syncope 09/16/2020  . Hypokalemia 09/16/2020  . Hypoalbuminemia 09/16/2020  . Abrasion of right arm 09/16/2020  . Right sided weakness 09/16/2020  . Accidental fall 09/16/2020  . Syncope and collapse 09/16/2020  . Acute CVA (cerebrovascular accident) ([place]) 09/15/2020  . Pyometra due to chronic inflammatory disease of uterus 02/18/2020  . Postmenopausal 08/11/2019  . Vitamin D deficiency 02/17/2019  . Constipation 02/17/2019  . Witnessed seizure-like activity ([place]) 02/03/2019  . Chronic systolic HF (heart failure) ([place]) 10/08/2018  . Educated about COVID-19 virus infection 10/08/2018  . Dementia ([place]) 02/10/2018  . S/P shoulder replacement, right 08/22/2017  . Essential hypertension 02/19/2016  . Voiding dysfunction 10/07/2014  . SUI (stress urinary incontinence, female) 10/07/2014  . Osteoporosis with fracture 04/06/2014  . DOE (dyspnea on exertion) 07/23/2013  . Helicobacter positive gastritis 06/21/2013  . Orthostatic hypotension 06/10/2013  . Recurrent UTI 05/06/2013  . Neck mass 02/19/2013  . Right groin mass 11/08/2011  . Low back pain 11/08/2011  . Lipoma of neck 06/21/2011  . OVERACTIVE BLADDER 08/21/2010  . Long term current use of  anticoagulant 07/25/2010  . Osteoarthritis 01/18/2010  . KNEE PAIN 01/18/2010  . FOOT PAIN 01/18/2010  . Mixed hyperlipidemia 04/20/2009  . GERD 04/17/2009  . MITRAL REGURGITATION, MILD 08/30/2008  . NICM (nonischemic cardiomyopathy) ([place]) 08/30/2008  . Depression, major, single episode, moderate ([place]) 04/10/2007  . DISEASE, MITRAL VALVE NEC/NOS 04/10/2007  . Atrial fibrillation ([place]) 04/10/2007  . STASIS ULCER 04/10/2007  . VENOUS INSUFFICIENCY, LEGS 04/10/2007    Orientation RESPIRATION BLADDER Height & Weight     Self,Time,Situation,Place  Normal Incontinent Weight: 98.2 kg Height:  5\' 6"  (167.6 cm)  BEHAVIORAL SYMPTOMS/MOOD NEUROLOGICAL BOWEL NUTRITION STATUS      Continent Diet (Heart healthy with thin liquids)  AMBULATORY STATUS COMMUNICATION OF NEEDS Skin   Extensive Assist   Normal                       Personal Care Assistance Level of Assistance  Bathing,Feeding,Dressing Bathing Assistance: Limited assistance Feeding assistance: Independent Dressing Assistance: Limited assistance     Functional Limitations Info  Sight,Hearing,Speech Sight Info: Impaired Hearing Info: Adequate Speech Info: Adequate    SPECIAL CARE FACTORS FREQUENCY                       Contractures Contractures Info: Not present    Additional Factors Info  Code Status,Allergies,Psychotropic Code Status Info: Full Allergies Info: Bactrim/ Lipitor/ Lisinopril Psychotropic Info: Depakote ER 250 mg daily/ Cymbalta Dr 30 mg daily/ Namenda 5 mg BID/ Seroquel 25 mg at bedtime         Current Medications (09/18/2020):  This is the current hospital active medication list Current  Facility-Administered Medications  Medication Dose Route Frequency Provider Last Rate Last Admin  . apixaban (ELIQUIS) tablet 5 mg  5 mg Oral Q12H Charlynne Cousins, MD   5 mg at 09/17/20 2101  . bacitracin ointment 1 application  1 application Topical BID Adefeso, Oladapo, DO   1 application at  33/35/45 2105  . cefTAZidime (FORTAZ) 2 g in sodium chloride 0.9 % 100 mL IVPB  2 g Intravenous Q8H Charlynne Cousins, MD 200 mL/hr at 09/18/20 0600 2 g at 09/18/20 0600  . divalproex (DEPAKOTE ER) 24 hr tablet 250 mg  250 mg Oral Daily Charlynne Cousins, MD   250 mg at 09/17/20 1156  . DULoxetine (CYMBALTA) DR capsule 30 mg  30 mg Oral Daily Phillips Odor, MD   30 mg at 09/17/20 1156  . [START ON 09/30/2020] DULoxetine (CYMBALTA) DR capsule 30 mg  30 mg Oral BID Phillips Odor, MD      . linaclotide Rolan Lipa) capsule 72 mcg  72 mcg Oral QAC breakfast Charlynne Cousins, MD   72 mcg at 09/18/20 0747  . memantine (NAMENDA) tablet 5 mg  5 mg Oral BID Charlynne Cousins, MD   5 mg at 09/17/20 2101  . metoprolol tartrate (LOPRESSOR) tablet 25 mg  25 mg Oral BID Charlynne Cousins, MD   25 mg at 09/17/20 2102  . ondansetron (ZOFRAN) tablet 4 mg  4 mg Oral Q8H PRN Charlynne Cousins, MD      . QUEtiapine (SEROQUEL) tablet 25 mg  25 mg Oral QHS Phillips Odor, MD   25 mg at 09/17/20 2101  . Tdap (BOOSTRIX) injection 0.5 mL  0.5 mL Intramuscular Once Adefeso, Oladapo, DO      . traMADol (ULTRAM) tablet 50-100 mg  50-100 mg Oral Q12H PRN Charlynne Cousins, MD   50 mg at 09/17/20 0848     Discharge Medications: Please see discharge summary for a list of discharge medications.  Relevant Imaging Results:  Relevant Lab Results:   Additional Information SS#: [id number]  Pollie Friar, RN

## 2020-09-18 NOTE — Progress Notes (Signed)
[facility] PROGRESS NOTE    Progress Note  Virginia Young  DGL:[id number] DOB: [birth year] DOA: 09/15/2020 PCP: Sharion Balloon, FNP     Brief Narrative:   Virginia Young is an 80 y.o. female past medical history significant for A. fib on Eliquis, chronic systolic heart failure with an EF of 25% by 2D echo in October 2020 essential hypertension obesity recently treated for UTI as an outpatient with Augmentin.  Comes into the emergency room for lightheadedness and suspect syncope walking with her walker in the kitchen she flatly felt dizzy and passed out forward hitting her head she relates it was about a minute. In the ED she was found hemodynamically stable  Significant studies: Right knee x-ray showed no fractures. Right hip x-ray showed no fracture or dislocation. Right humerus x-ray showed heterotropic ossification versus fracture of uncertain chronicity. CT of the head showed no acute findings. 09/16/2020 MRI of the brain showed no acute findings atrophic and chronic small vessel disease. 09/16/2020 MRI of the head showed no significant stenosis.  Antibiotics: None  Microbiology data: Urine culture on 09/15/2018 showed Pseudomonas sensitive to Cipro.  Procedures: None  Assessment/Plan:   Syncope due to orthostatic hypotesion: MRI of the brain showed no acute findings. 2D echo EF 51%, no diastolic dysfunction. Telemetry shows paroxysmal atrial fibrillation. Twelve-lead EKG shows A. fib/a flutter. Orthostatics: Continue to be positive, will limit the amount of antihypertensive medication will start on low-dose midodrine.  Right-sided weakness: MRI negative for stroke. Physical therapy and Occupational Therapy evaluation are pending. Allow a diet.  Recurrent UTI status post treatment: Urine culture on 09/12/2020 was positive for Pseudomonas, sensitive to Cipro changed to oral.  Hypokalemia: Likely due to hypovolemia: Repleted orally now  resolved.  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  Chronic atrial fibrillation: Rate controlled on Eliquis.  Right arm abrasion due to accidental fall: Continue wound care. Physical therapy has been consulted.  Essential hypertension: Hold all antihypertensive medication except for metoprolol.  Moderate dementia: Continue Namenda.   DVT prophylaxis: lovenox Family Communication:none Status is: Observation  The patient will require care spanning > 2 midnights and should be moved to inpatient because: Hemodynamically unstable  Dispo: The patient is from: Home              Anticipated d/c is to: SNF              Patient currently is not medically stable to d/c.   Difficult to place patient No  Code Status:     Code Status Orders  (From admission, onward)         Start     Ordered   09/16/20 0147  Full code  Continuous        09/16/20 0146        Code Status History    Date Active Date Inactive Code Status Order ID Comments User Context   08/22/2017 1234 08/25/2017 2232 Full Code [id number]  [name], MD Inpatient   05/06/2013 1631 05/10/2013 1653 Full Code [id number]  [name], MD ED   Advance Care Planning Activity    Advance Directive Documentation   Flowsheet Row Most Recent Value  Type of Advance Directive Healthcare Power of Attorney  Pre-existing out of facility DNR order (yellow form or pink MOST form) --  "MOST" Form in Place? --        IV Access:    Peripheral IV   Procedures and diagnostic studies:  CT ABDOMEN PELVIS W CONTRAST  Result Date: 09/17/2020 CLINICAL DATA:  Abdominal pain EXAM: CT ABDOMEN AND PELVIS WITH CONTRAST TECHNIQUE: Multidetector CT imaging of the abdomen and pelvis was performed using the standard protocol following bolus administration of intravenous contrast. CONTRAST:  169mL OMNIPAQUE IOHEXOL 300 MG/ML SOLN, additional oral enteric contrast COMPARISON:   01/12/2020 FINDINGS: Lower chest: No acute abnormality. Cardiomegaly and coronary artery calcification. Trace pericardial effusion, unchanged. Dependent bibasilar scarring and or atelectasis. Hepatobiliary: No solid liver abnormality is seen. No gallstones, gallbladder wall thickening, or biliary dilatation. Pancreas: Unremarkable. No pancreatic ductal dilatation or surrounding inflammatory changes. Spleen: Normal in size without significant abnormality. Adrenals/Urinary Tract: Adrenal glands are unremarkable. There are numerous small cortical defects of the bilateral kidneys. Kidneys are otherwise normal, without renal calculi, solid lesion, or hydronephrosis. Bladder is unremarkable. Stomach/Bowel: Stomach is within normal limits. There is masslike thickening of the cecal base adjacent to the appendiceal ostium, not clearly appreciated on prior examination (series 6, image 28). No evidence of bowel wall thickening, distention, or inflammatory changes. Vascular/Lymphatic: Aortic atherosclerosis. No enlarged abdominal or pelvic lymph nodes. Reproductive: Fluid is again seen in the endometrial cavity, although reduced in volume compared to prior examination. Calcified uterine fibroids. Other: No abdominal wall hernia or abnormality. No abdominopelvic ascites. Musculoskeletal: No acute or significant osseous findings. IMPRESSION: 1. No acute CT findings of the abdomen or pelvis to explain pain. 2. There is masslike thickening of the cecal base adjacent to the appendiceal ostium, not clearly appreciated on prior examination. This finding is concerning for malignancy. Recommend colonoscopy for further evaluation on a nonemergent basis. 3. Fluid is again seen in the endometrial cavity, although reduced in volume compared to prior examination. This remains abnormal in the postmenopausal setting and should be evaluated by ultrasound on a nonemergent basis if not previously assessed. 4. There are numerous small cortical  defects of the bilateral kidneys, generally consistent with prior infectious, obstructive, or ischemic insult. 5. Coronary artery disease. Aortic Atherosclerosis (ICD10-I70.0). Electronically Signed   By: Eddie Candle M.D.   On: 09/17/2020 17:48     Medical Consultants:    None.   Subjective:    Virginia Young feels dizzy upon standing.  Objective:    Vitals:   09/18/20 0430 09/18/20 0756 09/18/20 0801 09/18/20 0806  BP: 133/83 134/88 131/70 91/75  Pulse: 81 70 84 (!) 101  Resp:  17 17 (!) 25  Temp: 98.6 F (37 C) (!) 97.4 F (36.3 C)    TempSrc: Oral Oral    SpO2: 97% 100%    Weight:      Height:       SpO2: 100 % O2 Flow Rate (L/min): 2 L/min  No intake or output data in the 24 hours ending 09/18/20 1052 Filed Weights   09/15/20 2009 09/16/20 0138  Weight: 100.7 kg 98.2 kg    Exam: General exam: In no acute distress. Respiratory system: Good air movement and clear to auscultation. Cardiovascular system: S1 & S2 heard, RRR. No JVD. Gastrointestinal system: Abdomen is nondistended, soft and nontender.  Extremities: No pedal edema. Skin: No rashes, lesions or ulcers Psychiatry: Judgement and insight appear normal. Mood & affect appropriate. Data Reviewed:    Labs: Basic Metabolic Panel: Recent Labs  Lab 09/15/20 2038 09/16/20 0209 09/17/20 0947 09/18/20 0451  NA 137 138 136 129*  K 3.3* 3.1* 4.5 4.5  CL 99 98 99 98  CO2 29 32 31 27  GLUCOSE 125* 98 97 96  BUN  19 16 10  6*  CREATININE 1.32* 1.22* 0.95 0.75  CALCIUM 8.1* 8.5* 9.2 8.5*  MG  --  2.1  --   --   PHOS  --  3.1  --   --    GFR Estimated Creatinine Clearance: 66.3 mL/min (by C-G formula based on SCr of 0.75 mg/dL). Liver Function Tests: Recent Labs  Lab 09/15/20 2038 09/16/20 0209  AST 25 25  ALT 23 22  ALKPHOS 70 65  BILITOT 0.7 0.8  PROT 6.1* 5.9*  ALBUMIN 3.1* 3.1*   No results for input(s): LIPASE, AMYLASE in the last 168 hours. No results for input(s): AMMONIA in the  last 168 hours. Coagulation profile Recent Labs  Lab 09/15/20 2038 09/16/20 0209  INR 1.6* 1.5*   COVID-19 Labs  No results for input(s): DDIMER, FERRITIN, LDH, CRP in the last 72 hours.  Lab Results  Component Value Date   [facility] NEGATIVE 09/15/2020    CBC: Recent Labs  Lab 09/12/20 1317 09/15/20 2038 09/16/20 0209  WBC 9.7 9.8 9.1  NEUTROABS 6.0 6.5  --   HGB 15.2 13.8 14.5  HCT 45.8 42.9 44.4  MCV 90 92.1 90.8  PLT 294 225 224   Cardiac Enzymes: No results for input(s): CKTOTAL, CKMB, CKMBINDEX, TROPONINI in the last 168 hours. BNP (last 3 results) No results for input(s): PROBNP in the last 8760 hours. CBG: No results for input(s): GLUCAP in the last 168 hours. D-Dimer: No results for input(s): DDIMER in the last 72 hours. Hgb A1c: Recent Labs    09/16/20 0450  HGBA1C 6.4*   Lipid Profile: Recent Labs    09/16/20 0450  CHOL 152  HDL 40*  LDLCALC 96  TRIG 78  CHOLHDL 3.8   Thyroid function studies: No results for input(s): TSH, T4TOTAL, T3FREE, THYROIDAB in the last 72 hours.  Invalid input(s): FREET3 Anemia work up: No results for input(s): VITAMINB12, FOLATE, FERRITIN, TIBC, IRON, RETICCTPCT in the last 72 hours. Sepsis Labs: Recent Labs  Lab 09/12/20 1317 09/15/20 2038 09/16/20 0209  WBC 9.7 9.8 9.1   Microbiology Recent Results (from the past 240 hour(s))  Microscopic Examination     Status: Abnormal   Collection Time: 09/12/20  1:21 PM   Urine  Result Value Ref Range Status   WBC, UA >30 (A) 0 - 5 /hpf Final   RBC 0-2 0 - 2 /hpf Final   Epithelial Cells (non renal) None seen 0 - 10 /hpf Final   Bacteria, UA Many (A) None seen/Few Final  Urine Culture     Status: Abnormal   Collection Time: 09/12/20  4:14 PM   Specimen: Urine   UR  Result Value Ref Range Status   Urine Culture, Routine Final report (A)  Final   Organism ID, Bacteria Comment (A)  Final    Comment: Pseudomonas aeruginosa Greater than 100,000 colony  forming units per mL    ORGANISM ID, BACTERIA Comment  Final    Comment: Mixed urogenital flora 25,000-50,000 colony forming units per mL    Antimicrobial Susceptibility Comment  Final    Comment:       ** S = Susceptible; I = Intermediate; R = Resistant **                    P = Positive; N = Negative             MICS are expressed in micrograms per mL    Antibiotic  RSLT#1    RSLT#2    RSLT#3    RSLT#4 Amikacin                       S Cefepime                       S Ceftazidime                    S Ciprofloxacin                  S Gentamicin                     S Imipenem                       S Levofloxacin                   S Meropenem                      S Piperacillin                   S Ticarcillin                    S Tobramycin                     S   Resp Panel by RT-PCR (Flu A&B, Covid) Nasopharyngeal Swab     Status: None   Collection Time: 09/15/20  9:00 PM   Specimen: Nasopharyngeal Swab; Nasopharyngeal(NP) swabs in vial transport medium  Result Value Ref Range Status   SARS Coronavirus 2 by RT PCR NEGATIVE NEGATIVE Final    Comment: (NOTE) SARS-CoV-2 target nucleic acids are NOT DETECTED.  The SARS-CoV-2 RNA is generally detectable in upper respiratory specimens during the acute phase of infection. The lowest concentration of SARS-CoV-2 viral copies this assay can detect is 138 copies/mL. A negative result does not preclude SARS-Cov-2 infection and should not be used as the sole basis for treatment or other patient management decisions. A negative result may occur with  improper specimen collection/handling, submission of specimen other than nasopharyngeal swab, presence of viral mutation(s) within the areas targeted by this assay, and inadequate number of viral copies(<138 copies/mL). A negative result must be combined with clinical observations, patient history, and epidemiological information. The expected result is Negative.  Fact Sheet  for Patients:  EntrepreneurPulse.com.au  Fact Sheet for Healthcare Providers:  IncredibleEmployment.be  This test is no t yet approved or cleared by the [place] FDA and  has been authorized for detection and/or diagnosis of SARS-CoV-2 by FDA under an Emergency Use Authorization (EUA). This EUA will remain  in effect (meaning this test can be used) for the duration of the COVID-19 declaration under Section 564(b)(1) of the Act, 21 U.S.C.section 360bbb-3(b)(1), unless the authorization is terminated  or revoked sooner.       Influenza A by PCR NEGATIVE NEGATIVE Final   Influenza B by PCR NEGATIVE NEGATIVE Final    Comment: (NOTE) The Xpert Xpress SARS-CoV-2/FLU/RSV plus assay is intended as an aid in the diagnosis of influenza from Nasopharyngeal swab specimens and should not be used as a sole basis for treatment. Nasal washings and aspirates are unacceptable for Xpert Xpress SARS-CoV-2/FLU/RSV testing.  Fact Sheet for Patients: EntrepreneurPulse.com.au  Fact Sheet for Healthcare Providers: IncredibleEmployment.be  This test  is not yet approved or cleared by the [place] and has been authorized for detection and/or diagnosis of SARS-CoV-2 by FDA under an Emergency Use Authorization (EUA). This EUA will remain in effect (meaning this test can be used) for the duration of the COVID-19 declaration under Section 564(b)(1) of the Act, 21 U.S.C. section 360bbb-3(b)(1), unless the authorization is terminated or revoked.  Performed at [facility], [address], [place], [place] [address]      Medications:   . apixaban  5 mg Oral Q12H  . bacitracin  1 application Topical BID  . divalproex  250 mg Oral Daily  . DULoxetine  30 mg Oral Daily  . [START ON 09/30/2020] DULoxetine  30 mg Oral BID  . linaclotide  72 mcg Oral QAC breakfast  . memantine  5 mg Oral BID  . metoprolol tartrate  25 mg  Oral BID  . QUEtiapine  25 mg Oral QHS  . Tdap  0.5 mL Intramuscular Once   Continuous Infusions: . cefTAZidime (FORTAZ)  IV 2 g (09/18/20 0600)      LOS: 2 days   Charlynne Cousins  [facility]  09/18/2020, 10:52 AM

## 2020-09-18 NOTE — TOC Initial Note (Addendum)
Transition of Care [facility]) - Initial/Assessment Note    Patient Details  Name: Virginia Young MRN: [id number] Date of Birth: 12-24-1940  Transition of Care [facility]) CM/SW Contact:    Pollie Friar, RN Phone Number: 09/18/2020, 1:24 PM  Clinical Narrative:                 Recommendations changed today to SNF rehab. CM has met with the patient and with her permission reached out to her daughter: [name]. They are interested in SNF rehab in either [place] or [place]. CM has completed FL2 and sent information out to these facilities. Pam also agreeable to other facilities in [place] will contact [facility] as this is their first choice to see if able to offer a bed.  Pt has had the first 2 moderna vaccines and would like the booster while in the hospital. MD updated.  TOC following.  1510: [facility] has offered a bed for the patient tomorrow. Daughter updated. Covid test ordered for tomorrow d/c.    Expected Discharge Plan: Skilled Nursing Facility Barriers to Discharge: Continued Medical Work up   Patient Goals and CMS Choice   CMS Medicare.gov Compare Post Acute Care list provided to:: Patient Choice offered to / list presented to : [place]  Expected Discharge Plan and Services Expected Discharge Plan: [place] In-house Referral: Clinical Social Work Discharge Planning Services: CM Consult Post Acute Care Choice: [place] arrangements for the past 2 months: [place]                                      Prior Living Arrangements/Services Living arrangements for the past 2 months: Single Family Home Lives with:: Self Patient language and need for interpreter reviewed:: Yes Do you feel safe going back to the place where you live?: Yes      Need for Family Participation in Patient Care: Yes (Comment) Care giver support system in place?: No (comment)   Criminal Activity/Legal Involvement  Pertinent to Current Situation/Hospitalization: No - Comment as needed  Activities of Daily Living      Permission Sought/Granted                  Emotional Assessment Appearance:: Appears stated age Attitude/Demeanor/Rapport: Engaged Affect (typically observed): Accepting Orientation: : Oriented to Self,Oriented to Place,Oriented to Situation   Psych Involvement: No (comment)  Admission diagnosis:  Acute CVA (cerebrovascular accident) ([place]) [I63.9] Skin tear of right upper arm without complication, initial encounter [S41.111A] Syncope, unspecified syncope type [R55] Pain and numbness of right upper extremity [M79.601, R20.0] Syncope and collapse [R55] Patient Active Problem List   Diagnosis Date Noted  . Syncope 09/16/2020  . Hypokalemia 09/16/2020  . Hypoalbuminemia 09/16/2020  . Abrasion of right arm 09/16/2020  . Right sided weakness 09/16/2020  . Accidental fall 09/16/2020  . Syncope and collapse 09/16/2020  . Acute CVA (cerebrovascular accident) ([place]) 09/15/2020  . Pyometra due to chronic inflammatory disease of uterus 02/18/2020  . Postmenopausal 08/11/2019  . Vitamin D deficiency 02/17/2019  . Constipation 02/17/2019  . Witnessed seizure-like activity ([place]) 02/03/2019  . Chronic systolic HF (heart failure) ([place]) 10/08/2018  . Educated about COVID-19 virus infection 10/08/2018  . Dementia ([place]) 02/10/2018  . S/P shoulder replacement, right 08/22/2017  . Essential hypertension 02/19/2016  . Voiding dysfunction 10/07/2014  . SUI (stress urinary incontinence, female) 10/07/2014  . Osteoporosis  with fracture 04/06/2014  . DOE (dyspnea on exertion) 07/23/2013  . Helicobacter positive gastritis 06/21/2013  . Orthostatic hypotension 06/10/2013  . Recurrent UTI 05/06/2013  . Neck mass 02/19/2013  . Right groin mass 11/08/2011  . Low back pain 11/08/2011  . Lipoma of neck 06/21/2011  . OVERACTIVE BLADDER 08/21/2010  . Long term current use of anticoagulant  07/25/2010  . Osteoarthritis 01/18/2010  . KNEE PAIN 01/18/2010  . FOOT PAIN 01/18/2010  . Mixed hyperlipidemia 04/20/2009  . GERD 04/17/2009  . MITRAL REGURGITATION, MILD 08/30/2008  . NICM (nonischemic cardiomyopathy) ([employer]) 08/30/2008  . Depression, major, single episode, moderate ([place]) 04/10/2007  . DISEASE, MITRAL VALVE NEC/NOS 04/10/2007  . Atrial fibrillation ([place]) 04/10/2007  . STASIS ULCER 04/10/2007  . VENOUS INSUFFICIENCY, LEGS 04/10/2007   PCP:  Sharion Balloon, FNP Pharmacy:   [id number] [place], [id number]: [phone]: [phone]    Social Determinants of Health (SDOH) Interventions    Readmission Risk Interventions No flowsheet data found.

## 2020-09-18 NOTE — Progress Notes (Addendum)
Physical Therapy Treatment Patient Details Name: Virginia Young MRN: [id number] DOB: 1940-08-28 Today's Date: 09/18/2020    History of Present Illness 80 y/o female presented to ED on 3/25 due to lightheadedness and syncopal episode. Head CT and x-rays were all negative. PMH: Afib on Eliquis, HTN, mild dementia, CHF, GERD, HLD, MS, recurrent UTIs    PT Comments    Pt sitting up in recliner on arrival. Reports she still feeling poorly. BP in recliner 131/83. Pt required min guard assist sit to stand with RW and min guard assist static stand with RW x 1 minute. BP standing 103/73 with pt c/o dizziness, requiring return to sitting in recliner. Her orthostatic hypotension is improving but she is still unable to tolerate ambulation. MD has now ordered TED hose. Pt's LE elevated in recliner and pt instructed in ankle pumps. PT to continue to follow. Recommending SNF at d/c due to pt lives alone and at high fall risk. If family able to provide 24-hour assist, pt could d/c home with HHPT.   Follow Up Recommendations  SNF;Supervision/Assistance - 24 hour     Equipment Recommendations  Rolling walker with 5" wheels    Recommendations for Other Services       Precautions / Restrictions Precautions Precautions: Fall;Other (comment) Precaution Comments: watch BP    Mobility  Bed Mobility               General bed mobility comments: Pt received in recliner.    Transfers Overall transfer level: Needs assistance Equipment used: Rolling walker (2 wheeled) Transfers: Sit to/from Stand Sit to Stand: Min guard         General transfer comment: min guard for safety, increased time  Ambulation/Gait             General Gait Details: unable to progress amb due to symptomatic orthostatic hypotension   Stairs             Wheelchair Mobility    Modified Rankin (Stroke Patients Only)       Balance Overall balance assessment: Mild deficits observed, not formally  tested                                          Cognition Arousal/Alertness: Awake/alert Behavior During Therapy: WFL for tasks assessed/performed Overall Cognitive Status: Within Functional Limits for tasks assessed                                        Exercises General Exercises - Lower Extremity Ankle Circles/Pumps: AROM;Both;20 reps    General Comments General comments (skin integrity, edema, etc.): Pt with + orthostatic BP (although it is improving). Pt is tolerating sitting in recliner. Sitting BP 131/83 HR 88. Standing BP 103/73 HR 101. SpO2 100% on 2L. Pt with c/o dizziness with stance.      Pertinent Vitals/Pain Pain Assessment: Faces Faces Pain Scale: Hurts a little bit Pain Location: abdomen Pain Descriptors / Indicators: Discomfort Pain Intervention(s): Monitored during session    Home Living                      Prior Function            PT Goals (current goals can now be found in the care plan section) Acute Rehab  PT Goals Patient Stated Goal: to go home Progress towards PT goals: Progressing toward goals    Frequency    Min 3X/week      PT Plan Discharge plan needs to be updated    Co-evaluation              AM-PAC PT "6 Clicks" Mobility   Outcome Measure  Help needed turning from your back to your side while in a flat bed without using bedrails?: A Little Help needed moving from lying on your back to sitting on the side of a flat bed without using bedrails?: A Little Help needed moving to and from a bed to a chair (including a wheelchair)?: A Little Help needed standing up from a chair using your arms (e.g., wheelchair or bedside chair)?: A Little Help needed to walk in hospital room?: A Little Help needed climbing 3-5 steps with a railing? : A Little 6 Click Score: 18    End of Session   Activity Tolerance: Treatment limited secondary to medical complications (Comment) (orthostatic  hypotension) Patient left: in chair;with call bell/phone within reach;with chair alarm set Nurse Communication: Mobility status PT Visit Diagnosis: Unsteadiness on feet (R26.81);Muscle weakness (generalized) (M62.81);History of falling (Z91.81)     Time: 5697-9480 PT Time Calculation (min) (ACUTE ONLY): 20 min  Charges:  $Therapeutic Activity: 8-22 mins                     Lorrin Goodell, PT  Office # [phone] Pager [phone]    Lorriane Shire 09/18/2020, 9:15 AM

## 2020-09-19 ENCOUNTER — Ambulatory Visit (HOSPITAL_COMMUNITY): Payer: Medicare Other

## 2020-09-19 DIAGNOSIS — R55 Syncope and collapse: Secondary | ICD-10-CM | POA: Diagnosis not present

## 2020-09-19 LAB — CBC WITH DIFFERENTIAL/PLATELET
Abs Immature Granulocytes: 0.11 10*3/uL — ABNORMAL HIGH (ref 0.00–0.07)
Basophils Absolute: 0.1 10*3/uL (ref 0.0–0.1)
Basophils Relative: 1 %
Eosinophils Absolute: 0.2 10*3/uL (ref 0.0–0.5)
Eosinophils Relative: 2 %
HCT: 42.6 % (ref 36.0–46.0)
Hemoglobin: 14.9 g/dL (ref 12.0–15.0)
Immature Granulocytes: 1 %
Lymphocytes Relative: 20 %
Lymphs Abs: 2 10*3/uL (ref 0.7–4.0)
MCH: 30.4 pg (ref 26.0–34.0)
MCHC: 35 g/dL (ref 30.0–36.0)
MCV: 86.9 fL (ref 80.0–100.0)
Monocytes Absolute: 1.1 10*3/uL — ABNORMAL HIGH (ref 0.1–1.0)
Monocytes Relative: 11 %
Neutro Abs: 6.8 10*3/uL (ref 1.7–7.7)
Neutrophils Relative %: 65 %
Platelets: 217 10*3/uL (ref 150–400)
RBC: 4.9 MIL/uL (ref 3.87–5.11)
RDW: 13.2 % (ref 11.5–15.5)
WBC: 10.4 10*3/uL (ref 4.0–10.5)
nRBC: 0 % (ref 0.0–0.2)

## 2020-09-19 LAB — BASIC METABOLIC PANEL
Anion gap: 5 (ref 5–15)
BUN: 5 mg/dL — ABNORMAL LOW (ref 8–23)
CO2: 30 mmol/L (ref 22–32)
Calcium: 8.7 mg/dL — ABNORMAL LOW (ref 8.9–10.3)
Chloride: 92 mmol/L — ABNORMAL LOW (ref 98–111)
Creatinine, Ser: 0.75 mg/dL (ref 0.44–1.00)
GFR, Estimated: >60 mL/min (ref >60)
Glucose, Bld: 96 mg/dL (ref 70–99)
Potassium: 4.2 mmol/L (ref 3.5–5.1)
Sodium: 127 mmol/L — ABNORMAL LOW (ref 135–145)

## 2020-09-19 LAB — TSH: TSH: 3.391 u[IU]/mL (ref 0.350–4.500)

## 2020-09-19 LAB — SARS CORONAVIRUS 2 (TAT 6-24 HRS): SARS Coronavirus 2: NEGATIVE

## 2020-09-19 MED ORDER — CIPROFLOXACIN HCL 500 MG PO TABS
500.0000 mg | ORAL_TABLET | Freq: Two times a day (BID) | ORAL | Status: DC
  Administered 2020-09-19 – 2020-09-20 (×3): 500 mg via ORAL
  Filled 2020-09-19 (×3): qty 1

## 2020-09-19 MED ORDER — SODIUM CHLORIDE 0.9 % IV SOLN: INTRAVENOUS | Status: DC

## 2020-09-19 MED ORDER — SODIUM CHLORIDE 0.9 % IV BOLUS
1000.0000 mL | Freq: Once | INTRAVENOUS | Status: AC
  Administered 2020-09-19: 1000 mL via INTRAVENOUS

## 2020-09-19 MED ORDER — MIDODRINE HCL 5 MG PO TABS
5.0000 mg | ORAL_TABLET | Freq: Three times a day (TID) | ORAL | Status: DC
  Administered 2020-09-19 – 2020-09-23 (×13): 5 mg via ORAL
  Filled 2020-09-19 (×13): qty 1

## 2020-09-19 MED ORDER — PANTOPRAZOLE SODIUM 40 MG PO TBEC
40.0000 mg | DELAYED_RELEASE_TABLET | Freq: Two times a day (BID) | ORAL | Status: DC
  Administered 2020-09-19 – 2020-09-23 (×9): 40 mg via ORAL
  Filled 2020-09-19 (×9): qty 1

## 2020-09-19 MED ORDER — MIDODRINE HCL 5 MG PO TABS
2.5000 mg | ORAL_TABLET | Freq: Once | ORAL | Status: DC
  Filled 2020-09-19: qty 1

## 2020-09-19 NOTE — TOC Progression Note (Signed)
Transition of Care [facility]) - Progression Note    Patient Details  Name: Virginia Young MRN: [id number] Date of Birth: October 08, 1940  [facility]) CM/SW Contact  Pollie Friar, RN Phone Number: 09/19/2020, 3:33 PM  Clinical Narrative:    [facility] Young has a bed for the patient today. Per MD patient is not ready for d/c. CM has updated the facility and pts daughter.  Plan is for d/c in am.  Expected Discharge Plan: [place] Barriers to Discharge: Continued Medical Work up  Expected Discharge Plan and Services Expected Discharge Plan: [place] In-house Referral: Clinical Social Work Discharge Planning Services: CM Consult Post Acute Care Choice: [place] arrangements for the past 2 months: Single Family Home                                       Social Determinants of Health (SDOH) Interventions    Readmission Risk Interventions No flowsheet data found.

## 2020-09-19 NOTE — Progress Notes (Signed)
[facility] PROGRESS NOTE    Progress Note  Virginia Young  SEG:[id number] DOB: 26-Jun-1940 DOA: 09/15/2020 PCP: Sharion Balloon, FNP     Brief Narrative:   Virginia Young is an 80 y.o. female past medical history significant for A. fib on Eliquis, chronic systolic heart failure with an EF of 25% by 2D echo in October 2020 essential hypertension obesity recently treated for UTI as an outpatient with Augmentin.  Comes into the emergency room for lightheadedness and suspect syncope walking with her walker in the kitchen she flatly felt dizzy and passed out forward hitting her head she relates it was about a minute. In the ED she was found hemodynamically stable  Significant studies: Right knee x-ray showed no fractures. Right hip x-ray showed no fracture or dislocation. Right humerus x-ray showed heterotropic ossification versus fracture of uncertain chronicity. CT of the head showed no acute findings. 09/16/2020 MRI of the brain showed no acute findings atrophic and chronic small vessel disease. 09/16/2020 MRI of the head showed no significant stenosis.  Antibiotics: None  Microbiology data: Urine culture on 09/15/2018 showed Pseudomonas sensitive to Cipro.  Procedures: None  Assessment/Plan:   Syncope due to dysautonomia MRI of the brain showed no acute findings. 2D echo EF 73%, no diastolic dysfunction. Telemetry shows paroxysmal atrial fibrillation. Orthostatics continue to be positive we will go ahead and increase her midodrine give her a bolus of normal saline. Now she is complaining of nausea.  Restarted after we started the ciprofloxacin. She only ate about 25% of her breakfast this morning, will start on Protonix p.o. twice daily CT scan showed no acute findings, but it did showed a masslike thickening of the cecal base, which could be further evaluated and not emergently as an outpatient with a colonoscopy.  Right-sided weakness: MRI negative for  stroke. Physical therapy and Occupational Therapy evaluation are pending. She has been eating about 25% of her food.  Mild hyponatremia: We will give her a bolus, recheck basic metabolic panel in the morning. Diuretics were held, check cortisol level in the morning and TSH.  Recurrent UTI status post treatment: Urine culture on 09/12/2020 was positive for Pseudomonas, sensitive to Cipro changed to oral.  Hypokalemia: Likely due to hypovolemia: Repleted orally now resolved.  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  Chronic atrial fibrillation: Rate controlled on Eliquis.  Right arm abrasion due to accidental fall: Continue wound care. Physical therapy has been consulted.  Essential hypertension: Hold all antihypertensive medication except for metoprolol.  Moderate dementia: Continue Namenda.   DVT prophylaxis: lovenox Family Communication:none Status is: Observation  The patient will require care spanning > 2 midnights and should be moved to inpatient because: Hemodynamically unstable  Dispo: The patient is from: Home              Anticipated d/c is to: SNF              Patient currently is not medically stable to d/c.   Difficult to place patient No  Code Status:     Code Status Orders  (From admission, onward)         Start     Ordered   09/16/20 0147  Full code  Continuous        09/16/20 0146        Code Status History    Date Active Date Inactive Code Status Order ID Comments User Context   08/22/2017 1234 08/25/2017 2232 Full Code  [id number]  [name], MD Inpatient   05/06/2013 1631 05/10/2013 1653 Full Code [id number]  [name], MD ED   Advance Care Planning Activity    Advance Directive Documentation   Flowsheet Row Most Recent Value  Type of Advance Directive Healthcare Power of Attorney  Pre-existing out of facility DNR order (yellow form or pink MOST form) --  "MOST" Form in Place?  --        IV Access:    Peripheral IV   Procedures and diagnostic studies:   CT ABDOMEN PELVIS W CONTRAST  Result Date: 09/17/2020 CLINICAL DATA:  Abdominal pain EXAM: CT ABDOMEN AND PELVIS WITH CONTRAST TECHNIQUE: Multidetector CT imaging of the abdomen and pelvis was performed using the standard protocol following bolus administration of intravenous contrast. CONTRAST:  114mL OMNIPAQUE IOHEXOL 300 MG/ML SOLN, additional oral enteric contrast COMPARISON:  01/12/2020 FINDINGS: Lower chest: No acute abnormality. Cardiomegaly and coronary artery calcification. Trace pericardial effusion, unchanged. Dependent bibasilar scarring and or atelectasis. Hepatobiliary: No solid liver abnormality is seen. No gallstones, gallbladder wall thickening, or biliary dilatation. Pancreas: Unremarkable. No pancreatic ductal dilatation or surrounding inflammatory changes. Spleen: Normal in size without significant abnormality. Adrenals/Urinary Tract: Adrenal glands are unremarkable. There are numerous small cortical defects of the bilateral kidneys. Kidneys are otherwise normal, without renal calculi, solid lesion, or hydronephrosis. Bladder is unremarkable. Stomach/Bowel: Stomach is within normal limits. There is masslike thickening of the cecal base adjacent to the appendiceal ostium, not clearly appreciated on prior examination (series 6, image 28). No evidence of bowel wall thickening, distention, or inflammatory changes. Vascular/Lymphatic: Aortic atherosclerosis. No enlarged abdominal or pelvic lymph nodes. Reproductive: Fluid is again seen in the endometrial cavity, although reduced in volume compared to prior examination. Calcified uterine fibroids. Other: No abdominal wall hernia or abnormality. No abdominopelvic ascites. Musculoskeletal: No acute or significant osseous findings. IMPRESSION: 1. No acute CT findings of the abdomen or pelvis to explain pain. 2. There is masslike thickening of the cecal base  adjacent to the appendiceal ostium, not clearly appreciated on prior examination. This finding is concerning for malignancy. Recommend colonoscopy for further evaluation on a nonemergent basis. 3. Fluid is again seen in the endometrial cavity, although reduced in volume compared to prior examination. This remains abnormal in the postmenopausal setting and should be evaluated by ultrasound on a nonemergent basis if not previously assessed. 4. There are numerous small cortical defects of the bilateral kidneys, generally consistent with prior infectious, obstructive, or ischemic insult. 5. Coronary artery disease. Aortic Atherosclerosis (ICD10-I70.0). Electronically Signed   By: Eddie Candle M.D.   On: 09/17/2020 17:48     Medical Consultants:    None.   Subjective:    Virginia Young she relates some nausea today did not eat her breakfast.  No vomiting.  Objective:    Vitals:   09/19/20 0346 09/19/20 0742 09/19/20 0750 09/19/20 0757  BP: (!) 149/94 (!) 151/94 (!) 144/92 99/78  Pulse: 80 77 68 96  Resp: 18 16  (!) 22  Temp: 98.3 F (36.8 C) 98.3 F (36.8 C)    TempSrc: Oral Oral    SpO2: 95% 100%    Weight:      Height:       SpO2: 100 % O2 Flow Rate (L/min): 2 L/min   Intake/Output Summary (Last 24 hours) at 09/19/2020 1101 Last data filed at 09/19/2020 0100 Gross per 24 hour  Intake --  Output 1400 ml  Net -1400 ml   [employer]  09/15/20 2009 09/16/20 0138  Weight: 100.7 kg 98.2 kg    Exam: General exam: In no acute distress. Respiratory system: Good air movement and clear to auscultation. Cardiovascular system: S1 & S2 heard, RRR. No JVD. Gastrointestinal system: Positive bowel sounds soft with epigastric tenderness no rebound or guarding Extremities: No pedal edema. Skin: No rashes, lesions or ulcers Psychiatry: Judgement and insight appear normal. Mood & affect appropriate. Data Reviewed:    Labs: Basic Metabolic Panel: Recent Labs  Lab  09/15/20 2038 09/16/20 0209 09/17/20 0947 09/18/20 0451 09/19/20 0307  NA 137 138 136 129* 127*  K 3.3* 3.1* 4.5 4.5 4.2  CL 99 98 99 98 92*  CO2 29 32 31 27 30   GLUCOSE 125* 98 97 96 96  BUN 19 16 10  6* 5*  CREATININE 1.32* 1.22* 0.95 0.75 0.75  CALCIUM 8.1* 8.5* 9.2 8.5* 8.7*  MG  --  2.1  --   --   --   PHOS  --  3.1  --   --   --    GFR Estimated Creatinine Clearance: 66.3 mL/min (by C-G formula based on SCr of 0.75 mg/dL). Liver Function Tests: Recent Labs  Lab 09/15/20 2038 09/16/20 0209  AST 25 25  ALT 23 22  ALKPHOS 70 65  BILITOT 0.7 0.8  PROT 6.1* 5.9*  ALBUMIN 3.1* 3.1*   No results for input(s): LIPASE, AMYLASE in the last 168 hours. No results for input(s): AMMONIA in the last 168 hours. Coagulation profile Recent Labs  Lab 09/15/20 2038 09/16/20 0209  INR 1.6* 1.5*   COVID-19 Labs  No results for input(s): DDIMER, FERRITIN, LDH, CRP in the last 72 hours.  Lab Results  Component Value Date   SARSCOV2NAA NEGATIVE 09/18/2020   [place] NEGATIVE 09/15/2020    CBC: Recent Labs  Lab 09/12/20 1317 09/15/20 2038 09/16/20 0209  WBC 9.7 9.8 9.1  NEUTROABS 6.0 6.5  --   HGB 15.2 13.8 14.5  HCT 45.8 42.9 44.4  MCV 90 92.1 90.8  PLT 294 225 224   Cardiac Enzymes: No results for input(s): CKTOTAL, CKMB, CKMBINDEX, TROPONINI in the last 168 hours. BNP (last 3 results) No results for input(s): PROBNP in the last 8760 hours. CBG: No results for input(s): GLUCAP in the last 168 hours. D-Dimer: No results for input(s): DDIMER in the last 72 hours. Hgb A1c: No results for input(s): HGBA1C in the last 72 hours. Lipid Profile: No results for input(s): CHOL, HDL, LDLCALC, TRIG, CHOLHDL, LDLDIRECT in the last 72 hours. Thyroid function studies: No results for input(s): TSH, T4TOTAL, T3FREE, THYROIDAB in the last 72 hours.  Invalid input(s): FREET3 Anemia work up: No results for input(s): VITAMINB12, FOLATE, FERRITIN, TIBC, IRON, RETICCTPCT in  the last 72 hours. Sepsis Labs: Recent Labs  Lab 09/12/20 1317 09/15/20 2038 09/16/20 0209  WBC 9.7 9.8 9.1   Microbiology Recent Results (from the past 240 hour(s))  Microscopic Examination     Status: Abnormal   Collection Time: 09/12/20  1:21 PM   Urine  Result Value Ref Range Status   WBC, UA >30 (A) 0 - 5 /hpf Final   RBC 0-2 0 - 2 /hpf Final   Epithelial Cells (non renal) None seen 0 - 10 /hpf Final   Bacteria, UA Many (A) None seen/Few Final  Urine Culture     Status: Abnormal   Collection Time: 09/12/20  4:14 PM   Specimen: Urine   UR  Result Value Ref Range Status   Urine  Culture, Routine Final report (A)  Final   Organism ID, Bacteria Comment (A)  Final    Comment: Pseudomonas aeruginosa Greater than 100,000 colony forming units per mL    ORGANISM ID, BACTERIA Comment  Final    Comment: Mixed urogenital flora 25,000-50,000 colony forming units per mL    Antimicrobial Susceptibility Comment  Final    Comment:       ** S = Susceptible; I = Intermediate; R = Resistant **                    P = Positive; N = Negative             MICS are expressed in micrograms per mL    Antibiotic                 RSLT#1    RSLT#2    RSLT#3    RSLT#4 Amikacin                       S Cefepime                       S Ceftazidime                    S Ciprofloxacin                  S Gentamicin                     S Imipenem                       S Levofloxacin                   S Meropenem                      S Piperacillin                   S Ticarcillin                    S Tobramycin                     S   Resp Panel by RT-PCR (Flu A&B, Covid) Nasopharyngeal Swab     Status: None   Collection Time: 09/15/20  9:00 PM   Specimen: Nasopharyngeal Swab; Nasopharyngeal(NP) swabs in vial transport medium  Result Value Ref Range Status   SARS Coronavirus 2 by RT PCR NEGATIVE NEGATIVE Final    Comment: (NOTE) SARS-CoV-2 target nucleic acids are NOT DETECTED.  The SARS-CoV-2  RNA is generally detectable in upper respiratory specimens during the acute phase of infection. The lowest concentration of SARS-CoV-2 viral copies this assay can detect is 138 copies/mL. A negative result does not preclude SARS-Cov-2 infection and should not be used as the sole basis for treatment or other patient management decisions. A negative result may occur with  improper specimen collection/handling, submission of specimen other than nasopharyngeal swab, presence of viral mutation(s) within the areas targeted by this assay, and inadequate number of viral copies(<138 copies/mL). A negative result must be combined with clinical observations, patient history, and epidemiological information. The expected result is Negative.  Fact Sheet for Patients:  EntrepreneurPulse.com.au  Fact Sheet for Healthcare Providers:  IncredibleEmployment.be  This test is no t yet approved or cleared by the  [place] Architectural technologist and  has been authorized for detection and/or diagnosis of SARS-CoV-2 by FDA under an Print production planner (EUA). This EUA will remain  in effect (meaning this test can be used) for the duration of the COVID-19 declaration under Section 564(b)(1) of the Act, 21 U.S.C.section 360bbb-3(b)(1), unless the authorization is terminated  or revoked sooner.       Influenza A by PCR NEGATIVE NEGATIVE Final   Influenza B by PCR NEGATIVE NEGATIVE Final    Comment: (NOTE) The Xpert Xpress SARS-CoV-2/FLU/RSV plus assay is intended as an aid in the diagnosis of influenza from Nasopharyngeal swab specimens and should not be used as a sole basis for treatment. Nasal washings and aspirates are unacceptable for Xpert Xpress SARS-CoV-2/FLU/RSV testing.  Fact Sheet for Patients: EntrepreneurPulse.com.au  Fact Sheet for Healthcare Providers: IncredibleEmployment.be  This test is not yet approved or cleared by the  [place] FDA and has been authorized for detection and/or diagnosis of SARS-CoV-2 by FDA under an Emergency Use Authorization (EUA). This EUA will remain in effect (meaning this test can be used) for the duration of the COVID-19 declaration under Section 564(b)(1) of the Act, 21 U.S.C. section 360bbb-3(b)(1), unless the authorization is terminated or revoked.  Performed at [facility], [address], [place], [place] [address]   SARS CORONAVIRUS 2 (TAT 6-24 HRS) Nasopharyngeal Nasopharyngeal Swab     Status: None   Collection Time: 09/18/20  3:12 PM   Specimen: Nasopharyngeal Swab  Result Value Ref Range Status   SARS Coronavirus 2 NEGATIVE NEGATIVE Final    Comment: (NOTE) SARS-CoV-2 target nucleic acids are NOT DETECTED.  The SARS-CoV-2 RNA is generally detectable in upper and lower respiratory specimens during the acute phase of infection. Negative results do not preclude SARS-CoV-2 infection, do not rule out co-infections with other pathogens, and should not be used as the sole basis for treatment or other patient management decisions. Negative results must be combined with clinical observations, patient history, and epidemiological information. The expected result is Negative.  Fact Sheet for Patients: SugarRoll.be  Fact Sheet for Healthcare Providers: https://www.woods-mathews.com/  This test is not yet approved or cleared by the [place] FDA and  has been authorized for detection and/or diagnosis of SARS-CoV-2 by FDA under an Emergency Use Authorization (EUA). This EUA will remain  in effect (meaning this test can be used) for the duration of the COVID-19 declaration under Se ction 564(b)(1) of the Act, 21 U.S.C. section 360bbb-3(b)(1), unless the authorization is terminated or revoked sooner.  Performed at [facility], [place] [address] [place], [place] [address]      Medications:   . apixaban  5 mg Oral  Q12H  . bacitracin  1 application Topical BID  . ciprofloxacin  500 mg Oral BID WC  . divalproex  250 mg Oral Daily  . DULoxetine  30 mg Oral Daily  . [START ON 09/30/2020] DULoxetine  30 mg Oral BID  . linaclotide  72 mcg Oral QAC breakfast  . memantine  5 mg Oral BID  . metoprolol tartrate  25 mg Oral BID  . midodrine  2.5 mg Oral Once  . midodrine  5 mg Oral TID WC  . pantoprazole  40 mg Oral BID  . QUEtiapine  25 mg Oral QHS  . Tdap  0.5 mL Intramuscular Once   Continuous Infusions: . sodium chloride        LOS: 3 days   Charlynne Cousins  [facility]  09/19/2020,  11:01 AM

## 2020-09-19 NOTE — Care Management Important Message (Signed)
Important Message  Patient Details  Name: Virginia Young MRN: [id number] Date of Birth: 1940-08-23   Medicare Important Message Given:  Yes     Orbie Pyo 09/19/2020, 2:32 PM

## 2020-09-19 NOTE — Consult Note (Signed)
WOC Nurse Consult Note: Patient receiving care in [facility] [phone]. Reason for Consult: care of RUE skin tear Wound type: skin tear related to fall prior to entry to hospital Pressure Injury POA: Yes/No/NA Measurement: na Wound bed: 100% clean, red Drainage (amount, consistency, odor) none Periwound: intact Dressing procedure/placement/frequency: I have modified the bacitracin bid order to reflect covering with Xeroform and a few turns of kerlex. Thank you for the consult.  Discussed plan of care with the patient and bedside nurse.  [place] nurse will not follow at this time.  Please re-consult the [place] team if needed.  Val Riles, RN, MSN, CWOCN, CNS-BC, pager [phone]

## 2020-09-19 NOTE — Progress Notes (Signed)
Occupational Therapy Treatment Patient Details Name: Virginia Young MRN: [id number] DOB: 11/27/1940 Today's Date: 09/19/2020    History of present illness 80 y/o female presented to ED on 3/25 due to lightheadedness and syncopal episode. Head CT and x-rays were all negative. PMH: Afib on Eliquis, HTN, mild dementia, CHF, GERD, HLD, MS, recurrent UTIs   OT comments  Pt progressing, but not feeling well this session. Pt minguardA for movement from recliner to stand for BP to read to sit to supine. Pt able manage UB/LB without physical assist. Pt c/o nausea and +orthostatics today. BP in sitting 149/76, 69 BPM; standing 137/69, 73 BPM. O2 95% on RA with exertion. Pt received diet ginger ale and crackers to assist with stomach uneasiness, RN aware of nausea, but pt was not due for meds yet. Pt would benefit from continued OT skilled services. OT following acutely.   Follow Up Recommendations  SNF;Home health OT;Supervision/Assistance - 24 hour    Equipment Recommendations  None recommended by OT    Recommendations for Other Services      Precautions / Restrictions Precautions Precautions: Fall;Other (comment) Precaution Comments: watch BP Restrictions Weight Bearing Restrictions: No       Mobility Bed Mobility Overal bed mobility: Needs Assistance Bed Mobility: Sit to Supine       Sit to supine: Min guard   General bed mobility comments: pt feeling nauseous and wanting to return to bed. Pt minguardA for movement from recliner to stand for BP to read to sit to supine. Pt able manage UB/LB without physical assist.    Transfers Overall transfer level: Needs assistance Equipment used: Rolling walker (2 wheeled) Transfers: Sit to/from Stand Sit to Stand: Min guard         General transfer comment: min guard for safety, increased time    Balance Overall balance assessment: Mild deficits observed, not formally tested                                          ADL either performed or assessed with clinical judgement   ADL Overall ADL's : Needs assistance/impaired                         Toilet Transfer: Min Insurance claims handler Details (indicate cue type and reason): simulated to Recliner         Functional mobility during ADLs: Min guard;Rolling walker;Cueing for safety General ADL Comments: Pt c/o nausea and +orthostatics today. BP in sitting 149/76, 69 BPM; standing 137/69, 73 BPM.     Vision   Vision Assessment?: No apparent visual deficits   Perception     Praxis      Cognition Arousal/Alertness: Awake/alert Behavior During Therapy: WFL for tasks assessed/performed Overall Cognitive Status: Within Functional Limits for tasks assessed                                          Exercises     Shoulder Instructions       General Comments BP in sitting 149/76, 69 BPM; standing 137/69, 73 BPM. O2 95% on RA with exertion.    Pertinent Vitals/ Pain       Pain Assessment: Faces Faces Pain Scale: Hurts even more Pain Location: abdomen Pain Descriptors / Indicators: Discomfort  Pain Intervention(s): Patient requesting pain meds-RN notified;Monitored during session;Repositioned  Home Living                                          Prior Functioning/Environment              Frequency  Min 2X/week        Progress Toward Goals  OT Goals(current goals can now be found in the care plan section)  Progress towards OT goals: Progressing toward goals  Acute Rehab OT Goals Patient Stated Goal: to go to rehab OT Goal Formulation: With patient Time For Goal Achievement: 09/30/20 Potential to Achieve Goals: Good ADL Goals Pt Will Perform Grooming: with supervision;standing Pt Will Transfer to Toilet: with supervision;ambulating Additional ADL Goal #1: Pt will increase to x5 mins of OOB ADL with supervisionA in standing to increase independence with ADL.   Plan Discharge plan remains appropriate    Co-evaluation                 AM-PAC OT "6 Clicks" Daily Activity     Outcome Measure   Help from another person eating meals?: None Help from another person taking care of personal grooming?: A Little Help from another person toileting, which includes using toliet, bedpan, or urinal?: A Lot Help from another person bathing (including washing, rinsing, drying)?: A Lot Help from another person to put on and taking off regular upper body clothing?: A Little Help from another person to put on and taking off regular lower body clothing?: A Lot 6 Click Score: 16    End of Session Equipment Utilized During Treatment: Gait belt;Rolling walker  OT Visit Diagnosis: Unsteadiness on feet (R26.81);Muscle weakness (generalized) (M62.81);Pain   Activity Tolerance Patient tolerated treatment well   Patient Left in bed;with call bell/phone within reach;with bed alarm set   Nurse Communication Mobility status        Time: 3903-0092 OT Time Calculation (min): 30 min  Charges: OT General Charges $OT Visit: 1 Visit OT Treatments $Self Care/Home Management : 8-22 mins $Therapeutic Activity: 8-22 mins  Jefferey Pica, OTR/L Acute Rehabilitation Services Pager: [phone] Office: [phone]    Marybella Ethier C 09/19/2020, 4:30 PM

## 2020-09-20 DIAGNOSIS — R55 Syncope and collapse: Secondary | ICD-10-CM | POA: Diagnosis not present

## 2020-09-20 LAB — CORTISOL: Cortisol, Plasma: 4.8 ug/dL

## 2020-09-20 LAB — BASIC METABOLIC PANEL
Anion gap: 5 (ref 5–15)
BUN: 5 mg/dL — ABNORMAL LOW (ref 8–23)
CO2: 28 mmol/L (ref 22–32)
Calcium: 8.3 mg/dL — ABNORMAL LOW (ref 8.9–10.3)
Chloride: 90 mmol/L — ABNORMAL LOW (ref 98–111)
Creatinine, Ser: 0.68 mg/dL (ref 0.44–1.00)
GFR, Estimated: >60 mL/min (ref >60)
Glucose, Bld: 96 mg/dL (ref 70–99)
Potassium: 3.8 mmol/L (ref 3.5–5.1)
Sodium: 123 mmol/L — ABNORMAL LOW (ref 135–145)

## 2020-09-20 LAB — OSMOLALITY, URINE: Osmolality, Ur: 314 mOsm/kg (ref 300–900)

## 2020-09-20 LAB — SODIUM, URINE, RANDOM: Sodium, Ur: 70 mmol/L

## 2020-09-20 MED ORDER — METOPROLOL TARTRATE 12.5 MG HALF TABLET
12.5000 mg | ORAL_TABLET | Freq: Three times a day (TID) | ORAL | Status: DC
  Administered 2020-09-20 – 2020-09-23 (×9): 12.5 mg via ORAL
  Filled 2020-09-20 (×9): qty 1

## 2020-09-20 NOTE — Progress Notes (Signed)
PROGRESS NOTE   Virginia Young  MVH:[id number] DOB: [birth year] DOA: 09/15/2020 PCP: Sharion Balloon, FNP  Brief Narrative:  80 year old white female-lives alone at home P A. fib CHADS2 score >4/Eliquis Cardiomyopathy HF R EF EF 20%-->HF improved EF this admission 55-60% Prior multiple urinary infections-prior transobturator sling surgery at [facility] for prolapse BMI 34 HTN  Recent Pseudomonas UTI Rx Macrobid/Augmentin  Admit 3/25-sudden onset "dizzy"-fall forward hit head right arm injury  Hospital-Problem based course  Syncope 2/2 autonomic dysfunction MRI echo   [-] Orthostatics positive-on midodrine 5 3 times daily PT to assess in a.m. for component of vestibular dysfunction Right sided weakness Will need SNF placement Recurrent urinary infections (Pseudomonas) treated with Cipro most recently She may need to follow-up with her urogynecologist who performed her sling surgery in 2016 She may benefit from suppressive therapy Chronic atrial fibrillation CHADS2 score >4 Continue Eliquis Continue metoprolol but cut back dose to 12.5 3 times daily instead of 25 twice daily to promote less orthostasis All other antihypertensives from prior PTA (Lasix) have been held Depression Continue Seroquel 200 at bedtime, Namenda 5 twice daily, Depakote 250, Cymbalta 30 twice daily Colonic mass OP follow-up Right arm abrasion Continue wound care "bacitracin bid order to reflect covering with Xeroform and a few turns of kerlex."   DVT prophylaxis: Eliquis Code Status: Full Family Communication: Called and discussed with [name] [phone] Disposition:  Status is: Inpatient  Remains inpatient appropriate because:Hemodynamically unstable, Ongoing active pain requiring inpatient pain management, Altered mental status, Unsafe d/c plan and IV treatments appropriate due to intensity of illness or inability to take PO   Dispo: The patient is from: Home               [facility]              Patient currently is medically stable to d/c.   Difficult to place patient No  Consultants:     Procedures:   Significant studies: Right knee x-ray showed no fractures. Right hip x-ray showed no fracture or dislocation. Right humerus x-ray showed heterotropic ossification versus fracture of uncertain chronicity. CT of the head showed no acute findings. 09/16/2020 MRI of the brain showed no acute findings atrophic and chronic small vessel disease. 09/16/2020 MRI of the head showed no significant stenosis.  Antibiotics: None  Microbiology data: Urine culture on 09/15/2018 showed Pseudomonas sensitive to Cipro  Antimicrobials:     Subjective:  Awake alert no distress Some N not eating much today No cp  Objective: Vitals:   09/20/20 0807 09/20/20 0907 09/20/20 1215 09/20/20 1518  BP: 139/80 (!) 141/94 135/76 (!) 155/82  Pulse: 75 66 (!) 54 73  Resp: 18  16 20   Temp: 98.1 F (36.7 C)   98.2 F (36.8 C)  TempSrc: Oral  Axillary Oral  SpO2: 97%  98% 98%  Weight:      Height:        Intake/Output Summary (Last 24 hours) at 09/20/2020 1741 Last data filed at 09/20/2020 1234 Gross per 24 hour  Intake 480 ml  Output 600 ml  Net -120 ml   Filed Weights   09/15/20 2009 09/16/20 0138  Weight: 100.7 kg 98.2 kg    Examination:  Awake no distress cta b  s1 s2 no m/r/g-in afib abd obese nt nd no rebound no guard Some Le edema Neuro intact to power psych euthymic   Data Reviewed: personally reviewed  CBC    Component Value Date/Time   WBC 10.4 09/19/2020 1153   RBC 4.90 09/19/2020 1153   HGB 14.9 09/19/2020 1153   HGB 15.2 09/12/2020 1317   HCT 42.6 09/19/2020 1153   HCT 45.8 09/12/2020 1317   PLT 217 09/19/2020 1153   PLT 294 09/12/2020 1317   MCV 86.9 09/19/2020 1153   MCV 90 09/12/2020 1317   MCH 30.4 09/19/2020 1153   MCHC 35.0 09/19/2020 1153   RDW 13.2 09/19/2020 1153    RDW 12.7 09/12/2020 1317   LYMPHSABS 2.0 09/19/2020 1153   LYMPHSABS 2.4 09/12/2020 1317   MONOABS 1.1 (H) 09/19/2020 1153   EOSABS 0.2 09/19/2020 1153   EOSABS 0.3 09/12/2020 1317   BASOSABS 0.1 09/19/2020 1153   BASOSABS 0.1 09/12/2020 1317   CMP Latest Ref Rng & Units 09/20/2020 09/19/2020 09/18/2020  Glucose 70 - 99 mg/dL 96 96 96  BUN 8 - 23 mg/dL 5(L) 5(L) 6(L)  Creatinine 0.44 - 1.00 mg/dL 0.68 0.75 0.75  Sodium 135 - 145 mmol/L 123(L) 127(L) 129(L)  Potassium 3.5 - 5.1 mmol/L 3.8 4.2 4.5  Chloride 98 - 111 mmol/L 90(L) 92(L) 98  CO2 22 - 32 mmol/L 28 30 27   Calcium 8.9 - 10.3 mg/dL 8.3(L) 8.7(L) 8.5(L)  Total Protein 6.5 - 8.1 g/dL - - -  Total Bilirubin 0.3 - 1.2 mg/dL - - -  Alkaline Phos 38 - 126 U/L - - -  AST 15 - 41 U/L - - -  ALT 0 - 44 U/L - - -     Radiology Studies: No results found.   Scheduled Meds: . apixaban  5 mg Oral Q12H  . bacitracin  1 application Topical BID  . divalproex  250 mg Oral Daily  . DULoxetine  30 mg Oral Daily  . [START ON 09/30/2020] DULoxetine  30 mg Oral BID  . linaclotide  72 mcg Oral QAC breakfast  . memantine  5 mg Oral BID  . metoprolol tartrate  12.5 mg Oral TID  . midodrine  5 mg Oral TID WC  . pantoprazole  40 mg Oral BID  . QUEtiapine  25 mg Oral QHS  . Tdap  0.5 mL Intramuscular Once   Continuous Infusions:   LOS: 4 days   Time spent: [place], MD [facility] To contact the attending provider between 7A-7P or the covering provider during after hours 7P-7A, please log into the web site [email or web address] and access using universal [place] password for that web site. If you do not have the password, please call the hospital operator.  09/20/2020, 5:41 PM

## 2020-09-20 NOTE — TOC Progression Note (Signed)
Transition of Care [facility]) - Progression Note    Patient Details  Name: Virginia Young MRN: [id number] Date of Birth: [birth year]  Transition of Care [facility]) CM/SW Contact  Pollie Friar, RN Phone Number: 09/20/2020, 11:38 AM  Clinical Narrative:    Pt unable to d/c to [facility] today due to low sodium level. Facility and daughter updated.    Expected Discharge Plan: [place] Barriers to Discharge: Continued Medical Work up  Expected Discharge Plan and Services Expected Discharge Plan: [place] In-house Referral: Clinical Social Work Discharge Planning Services: CM Consult Post Acute Care Choice: [place] arrangements for the past 2 months: Single Family Home                                       Social Determinants of Health (SDOH) Interventions    Readmission Risk Interventions No flowsheet data found.

## 2020-09-20 NOTE — Progress Notes (Signed)
Physical Therapy Treatment Patient Details Name: Virginia Young MRN: [id number] DOB: Aug 06, 1940 Today's Date: 09/20/2020    History of Present Illness 80 y/o female presented to ED on 3/25 due to lightheadedness and syncopal episode. Head CT and x-rays were all negative. PMH: Afib on Eliquis, HTN, mild dementia, CHF, GERD, HLD, MS, recurrent UTIs    PT Comments    Patient up in recliner on arrival with reports of nausea, however agreeable to PT session. Assessed orthostatic vitals (see below) with reports of dizziness upon standing. Patient performed standing and seated exercises. Patient overall min guard for transfers with RW. Feeling of nausea increased which limited duration of session. Continue to recommend SNF for ongoing Physical Therapy.     Orthostatic BPs  Sitting 156/117 HR 69  Standing 126/94 HR 86      Follow Up Recommendations  SNF;Supervision/Assistance - 24 hour     Equipment Recommendations  Rolling Devyn Griffing with 5" wheels    Recommendations for Other Services       Precautions / Restrictions Precautions Precautions: Fall;Other (comment) Precaution Comments: watch BP Restrictions Weight Bearing Restrictions: No    Mobility  Bed Mobility               General bed mobility comments: up in recliner on arrival    Transfers Overall transfer level: Needs assistance Equipment used: Rolling Braden Deloach (2 wheeled) Transfers: Sit to/from Stand Sit to Stand: Min guard         General transfer comment: min guard for safety, increased time. Sit to stand x 5  Ambulation/Gait             General Gait Details: deferred due to nausea and symptomatic orthostatics   Stairs             Wheelchair Mobility    Modified Rankin (Stroke Patients Only)       Balance Overall balance assessment: Mild deficits observed, not formally tested                                          Cognition Arousal/Alertness:  Awake/alert Behavior During Therapy: WFL for tasks assessed/performed Overall Cognitive Status: Within Functional Limits for tasks assessed                                        Exercises General Exercises - Lower Extremity Long Arc Quad: Both;10 reps;Seated Hip Flexion/Marching: Both;10 reps;Seated;Standing    General Comments General comments (skin integrity, edema, etc.): +orthostatics - BP sitting: 156/117, 69 bpm Standing: 126/94, 86 bpm      Pertinent Vitals/Pain Pain Assessment: Faces Faces Pain Scale: Hurts even more Pain Location: abdomen Pain Descriptors / Indicators: Discomfort Pain Intervention(s): Monitored during session;Limited activity within patient's tolerance    Home Living                      Prior Function            PT Goals (current goals can now be found in the care plan section) Acute Rehab PT Goals Patient Stated Goal: to go to rehab PT Goal Formulation: With patient Time For Goal Achievement: 09/30/20 Potential to Achieve Goals: Good Progress towards PT goals: Progressing toward goals    Frequency    Min 3X/week  PT Plan Current plan remains appropriate    Co-evaluation              AM-PAC PT "6 Clicks" Mobility   Outcome Measure  Help needed turning from your back to your side while in a flat bed without using bedrails?: A Little Help needed moving from lying on your back to sitting on the side of a flat bed without using bedrails?: A Little Help needed moving to and from a bed to a chair (including a wheelchair)?: A Little Help needed standing up from a chair using your arms (e.g., wheelchair or bedside chair)?: A Little Help needed to walk in hospital room?: A Little Help needed climbing 3-5 steps with a railing? : A Little 6 Click Score: 18    End of Session Equipment Utilized During Treatment: Gait belt Activity Tolerance: Patient limited by fatigue;Other (comment) (nausea) Patient left:  in chair;with call bell/phone within reach;with chair alarm set Nurse Communication: Mobility status PT Visit Diagnosis: Unsteadiness on feet (R26.81);Muscle weakness (generalized) (M62.81);History of falling (Z91.81)     Time: 8502-7741 PT Time Calculation (min) (ACUTE ONLY): 14 min  Charges:  $Therapeutic Activity: 8-22 mins                     Emin Foree A. Gilford Rile PT, DPT Acute Rehabilitation Services Pager [phone] Office [phone]    Linna Hoff 09/20/2020, 5:23 PM

## 2020-09-20 NOTE — Hospital Course (Addendum)
Dressing procedure/placement/frequency: I have modified the Thank you for the consult.  Discussed plan of care with the patient and bedside nurse.  [place] nurse will not follow at this time.  Please re-consult the [place] team if needed.

## 2020-09-21 DIAGNOSIS — R55 Syncope and collapse: Secondary | ICD-10-CM | POA: Diagnosis not present

## 2020-09-21 LAB — COMPREHENSIVE METABOLIC PANEL
ALT: 18 U/L (ref 0–44)
AST: 20 U/L (ref 15–41)
Albumin: 2.6 g/dL — ABNORMAL LOW (ref 3.5–5.0)
Alkaline Phosphatase: 62 U/L (ref 38–126)
Anion gap: 3 — ABNORMAL LOW (ref 5–15)
BUN: 8 mg/dL (ref 8–23)
CO2: 32 mmol/L (ref 22–32)
Calcium: 8.4 mg/dL — ABNORMAL LOW (ref 8.9–10.3)
Chloride: 90 mmol/L — ABNORMAL LOW (ref 98–111)
Creatinine, Ser: 0.87 mg/dL (ref 0.44–1.00)
GFR, Estimated: >60 mL/min (ref >60)
Glucose, Bld: 94 mg/dL (ref 70–99)
Potassium: 4.3 mmol/L (ref 3.5–5.1)
Sodium: 125 mmol/L — ABNORMAL LOW (ref 135–145)
Total Bilirubin: 0.9 mg/dL (ref 0.3–1.2)
Total Protein: 5.2 g/dL — ABNORMAL LOW (ref 6.5–8.1)

## 2020-09-21 MED ORDER — SODIUM CHLORIDE 1 G PO TABS
1.0000 g | ORAL_TABLET | Freq: Every day | ORAL | Status: DC
  Administered 2020-09-22 – 2020-09-23 (×2): 1 g via ORAL
  Filled 2020-09-21 (×2): qty 1

## 2020-09-21 MED ORDER — ONDANSETRON HCL 4 MG PO TABS
4.0000 mg | ORAL_TABLET | Freq: Three times a day (TID) | ORAL | Status: DC
  Administered 2020-09-21 – 2020-09-23 (×6): 4 mg via ORAL
  Filled 2020-09-21 (×6): qty 1

## 2020-09-21 MED ORDER — SODIUM CHLORIDE 1 G PO TABS
2.0000 g | ORAL_TABLET | Freq: Two times a day (BID) | ORAL | Status: DC
  Filled 2020-09-21: qty 2

## 2020-09-21 MED ORDER — COVID-19 MRNA VACC (MODERNA) 50 MCG/0.25ML IM SUSP
0.2500 mL | Freq: Once | INTRAMUSCULAR | Status: AC
  Administered 2020-09-21: 0.25 mL via INTRAMUSCULAR
  Filled 2020-09-21 (×2): qty 0.25

## 2020-09-21 NOTE — Progress Notes (Signed)
Physical Therapy Treatment/Vestibular Evaluation Patient Details Name: Virginia Young MRN: [id number] DOB: 06/21/1941 Today's Date: 09/21/2020    History of Present Illness 80 y/o female presented to ED on 3/25 due to lightheadedness and syncopal episode. Head CT and x-rays were all negative. PMH: Afib on Eliquis, HTN, mild dementia, CHF, GERD, HLD, MS, recurrent UTIs  Pt is a very interesting case.  I believe she has a peripherial vertigo (R ear hypofunction) superimposed on bil posterior canal BPPV (crystals likely came out when she hit her head on the ground after falling).  I treated her with bil Epley's maneuver with R having full negative test after one treatment and left I did not re-test after one treatment (Epley's) as I was starting to really make pt nauseated, near vomiting.  She reports long standing history of vertigo, but cannot relay if she has ever had treatment for it (I do not see any PT notes for treatment in her chart review).  She remains unsteady, but tolerated my treatments well.  See below for details of vestibular testing.  PT will continue to follow acutely for safe mobility progression.  Will check bil dix hallpike next session and start gaze stability exercises for habituation of the hypofunction.    PT Comments      Follow Up Recommendations  SNF;Supervision/Assistance - 24 hour     Equipment Recommendations  Rolling walker with 5" wheels    Recommendations for Other Services       Precautions / Restrictions Precautions Precautions: Fall;Other (comment) Precaution Comments: watch BP, vertiginous and nauseated    Mobility  Bed Mobility Overal bed mobility: Needs Assistance Bed Mobility: Rolling;Sidelying to Sit;Sit to Sidelying Rolling: Min assist Sidelying to sit: Min assist Supine to sit: Min assist Sit to supine: Min assist Sit to sidelying: Mod assist General bed mobility comments: Min to mod assist for all bed mobility related to vestibular  testing and treatment.    Transfers Overall transfer level: Needs assistance Equipment used: 1 person hand held assist Transfers: Sit to/from Stand Sit to Stand: Mod assist         General transfer comment: Mod assist at EOB without an AD for balance and steadying  Ambulation/Gait Ambulation/Gait assistance: Mod assist Gait Distance (Feet): 3 Feet Assistive device: 1 person hand held assist Gait Pattern/deviations: Step-to pattern     General Gait Details: mod assist for balance to side step to [facility] to reposition for vestibular testing, no AD used.   Stairs             Wheelchair Mobility    Modified Rankin (Stroke Patients Only)       Balance Overall balance assessment: Needs assistance Sitting-balance support: Feet supported;Bilateral upper extremity supported Sitting balance-Leahy Scale: Fair Sitting balance - Comments: posterior preference bil UE supported at times gets dizzy and has (+) trunkal sway.   Standing balance support: Single extremity supported Standing balance-Leahy Scale: Poor Standing balance comment: needs external support from therapist in standing.       09/21/20 0001  Vestibular Assessment  General Observation wears bifocals  Symptom Behavior  Subjective history of current problem Pt with h/o dementia, so difficulty hx.  She did fall, hit her head on R frontal area, reports she "passed out" but when I drilled down, she says she stood up, started spinning and fell over to her R.  Type of Dizziness  Spinning  Frequency of Dizziness there is some level of constant superimposed on some intensity increases  Duration of  Dizziness long with short increases  Symptom Nature Motion provoked;Positional  Aggravating Factors Sit to stand  Relieving Factors Lying supine;Closing eyes;Rest;Slow movements  Progression of Symptoms No change since onset  History of similar episodes pt reports h/o vertigo, not sure if she has ever had treatment for it,  though.  Oculomotor Exam  Oculomotor Alignment Abnormal  Spontaneous Absent  Gaze-induced  Left beating nystagmus with L gaze  Smooth Pursuits Saccades (normal for her age)  Saccades Intact  Oculomotor Exam-Fixation Suppressed   Left Head Impulse neg  Right Head Impulse pos  Vestibulo-Ocular Reflex  VOR 1 Head Only (x 1 viewing) (+) symptom evoking, but with therapist's assist, pt was able to hold the target.  Auditory  Comments HOH at baseline  Positional Testing  Dix-Hallpike Dix-Hallpike Right;Dix-Hallpike Left  Sidelying Test Sidelying Right;Sidelying Left  Horizontal Canal Testing Horizontal Canal Right;Horizontal Canal Left  Dix-Hallpike Right  Dix-Hallpike Right Duration 15 (significant delay in crystal movement)  Dix-Hallpike Right Symptoms Upbeat, right rotatory nystagmus  Dix-Hallpike Left  Dix-Hallpike Left Duration 20  Dix-Hallpike Left Symptoms Upbeat, left rotatory nystagmus  Sidelying Right  Sidelying Right Duration 10  Sidelying Right Symptoms Upbeat, right rotatory nystagmus  Sidelying Left  Sidelying Left Duration 0  Sidelying Left Symptoms No nystagmus  Horizontal Canal Right  Horizontal Canal Right Duration 0  Horizontal Canal Right Symptoms Normal  Horizontal Canal Left  Horizontal Canal Left Duration 0  Horizontal Canal Left Symptoms Normal  Orthostatics  Orthostatics Comment see previous orthostatic vitals.                          Cognition Arousal/Alertness: Awake/alert Behavior During Therapy: WFL for tasks assessed/performed Overall Cognitive Status: History of cognitive impairments - at baseline                                 General Comments: pt self reports and presents with some dementia.  STM deficits, difficulty with details, situation, following multistep commands      Exercises      General Comments General comments (skin integrity, edema, etc.): BPs not checked.  Pt reports vertigo made her fall (after  asking how she "passed out" she reported she did not black out, she got very "spinny" and fell over.      Pertinent Vitals/Pain Pain Assessment: Faces Faces Pain Scale: Hurts even more Pain Location: right arm Pain Descriptors / Indicators: Grimacing;Guarding Pain Intervention(s): Limited activity within patient's tolerance;Monitored during session;Repositioned    Home Living                      Prior Function            PT Goals (current goals can now be found in the care plan section) Acute Rehab PT Goals Patient Stated Goal: to go to rehab Progress towards PT goals: Progressing toward goals    Frequency    Min 3X/week      PT Plan Current plan remains appropriate    Co-evaluation              AM-PAC PT "6 Clicks" Mobility   Outcome Measure  Help needed turning from your back to your side while in a flat bed without using bedrails?: A Little Help needed moving from lying on your back to sitting on the side of a flat bed without using bedrails?: A Little Help  needed moving to and from a bed to a chair (including a wheelchair)?: A Lot Help needed standing up from a chair using your arms (e.g., wheelchair or bedside chair)?: A Lot Help needed to walk in hospital room?: A Lot Help needed climbing 3-5 steps with a railing? : Total 6 Click Score: 13    End of Session   Activity Tolerance: Patient limited by pain Patient left: in bed;with call bell/phone within reach;with bed alarm set Nurse Communication: Mobility status;Other (comment) (need for nausea meds) PT Visit Diagnosis: Unsteadiness on feet (R26.81);Muscle weakness (generalized) (M62.81);History of falling (Z91.81)     Time: 2706-2376 PT Time Calculation (min) (ACUTE ONLY): 42 min  Charges:  $Therapeutic Activity: 8-22 mins $[place] Rep Proc: 8-22 mins   1 re-eval                   Verdene Lennert, PT, DPT  Acute Rehabilitation [phone] pager [phone]) [phone] office

## 2020-09-21 NOTE — Plan of Care (Signed)
  Problem: Education: Goal: Knowledge of General Education information will improve Description: Including pain rating scale, medication(s)/side effects and non-pharmacologic comfort measures 09/21/2020 1444 by Drucie Ip I, RN Outcome: Progressing 09/21/2020 1444 by Drucie Ip I, RN Outcome: Progressing   Problem: Health Behavior/Discharge Planning: Goal: Ability to manage health-related needs will improve 09/21/2020 1444 by Drucie Ip I, RN Outcome: Progressing 09/21/2020 1444 by Drucie Ip I, RN Outcome: Progressing   Problem: Clinical Measurements: Goal: Ability to maintain clinical measurements within normal limits will improve 09/21/2020 1444 by Drucie Ip I, RN Outcome: Progressing 09/21/2020 1444 by Drucie Ip I, RN Outcome: Progressing Goal: Will remain free from infection 09/21/2020 1444 by Drucie Ip I, RN Outcome: Progressing 09/21/2020 1444 by Drucie Ip I, RN Outcome: Progressing Goal: Diagnostic test results will improve 09/21/2020 1444 by Drucie Ip I, RN Outcome: Progressing 09/21/2020 1444 by Drucie Ip I, RN Outcome: Progressing Goal: Respiratory complications will improve 09/21/2020 1444 by Drucie Ip I, RN Outcome: Progressing 09/21/2020 1444 by Drucie Ip I, RN Outcome: Progressing Goal: Cardiovascular complication will be avoided 09/21/2020 1444 by Drucie Ip I, RN Outcome: Progressing 09/21/2020 1444 by Drucie Ip I, RN Outcome: Progressing   Problem: Activity: Goal: Risk for activity intolerance will decrease 09/21/2020 1444 by Drucie Ip I, RN Outcome: Progressing 09/21/2020 1444 by Drucie Ip I, RN Outcome: Progressing   Problem: Nutrition: Goal: Adequate nutrition will be maintained 09/21/2020 1444 by Drucie Ip I, RN Outcome: Progressing 09/21/2020 1444 by Drucie Ip I, RN Outcome: Progressing   Problem: Coping: Goal: Level  of anxiety will decrease 09/21/2020 1444 by Drucie Ip I, RN Outcome: Progressing 09/21/2020 1444 by Drucie Ip I, RN Outcome: Progressing   Problem: Elimination: Goal: Will not experience complications related to bowel motility 09/21/2020 1444 by Drucie Ip I, RN Outcome: Progressing 09/21/2020 1444 by Drucie Ip I, RN Outcome: Progressing Goal: Will not experience complications related to urinary retention 09/21/2020 1444 by Drucie Ip I, RN Outcome: Progressing 09/21/2020 1444 by Drucie Ip I, RN Outcome: Progressing   Problem: Pain Managment: Goal: General experience of comfort will improve 09/21/2020 1444 by Drucie Ip I, RN Outcome: Progressing 09/21/2020 1444 by Drucie Ip I, RN Outcome: Progressing   Problem: Safety: Goal: Ability to remain free from injury will improve 09/21/2020 1444 by Drucie Ip I, RN Outcome: Progressing 09/21/2020 1444 by Drucie Ip I, RN Outcome: Progressing   Problem: Skin Integrity: Goal: Risk for impaired skin integrity will decrease 09/21/2020 1444 by Drucie Ip I, RN Outcome: Progressing 09/21/2020 1444 by Drucie Ip I, RN Outcome: Progressing

## 2020-09-21 NOTE — Plan of Care (Signed)
  Problem: Education: Goal: Knowledge of General Education information will improve Description: Including pain rating scale, medication(s)/side effects and non-pharmacologic comfort measures 09/21/2020 1444 by Drucie Ip I, RN Outcome: Progressing 09/21/2020 1444 by Drucie Ip I, RN Outcome: Progressing 09/21/2020 1444 by Drucie Ip I, RN Outcome: Progressing   Problem: Health Behavior/Discharge Planning: Goal: Ability to manage health-related needs will improve 09/21/2020 1444 by Drucie Ip I, RN Outcome: Progressing 09/21/2020 1444 by Drucie Ip I, RN Outcome: Progressing 09/21/2020 1444 by Drucie Ip I, RN Outcome: Progressing   Problem: Clinical Measurements: Goal: Ability to maintain clinical measurements within normal limits will improve 09/21/2020 1444 by Drucie Ip I, RN Outcome: Progressing 09/21/2020 1444 by Drucie Ip I, RN Outcome: Progressing 09/21/2020 1444 by Drucie Ip I, RN Outcome: Progressing Goal: Will remain free from infection 09/21/2020 1444 by Drucie Ip I, RN Outcome: Progressing 09/21/2020 1444 by Drucie Ip I, RN Outcome: Progressing 09/21/2020 1444 by Drucie Ip I, RN Outcome: Progressing Goal: Diagnostic test results will improve 09/21/2020 1444 by Drucie Ip I, RN Outcome: Progressing 09/21/2020 1444 by Drucie Ip I, RN Outcome: Progressing 09/21/2020 1444 by Drucie Ip I, RN Outcome: Progressing Goal: Respiratory complications will improve 09/21/2020 1444 by Drucie Ip I, RN Outcome: Progressing 09/21/2020 1444 by Drucie Ip I, RN Outcome: Progressing 09/21/2020 1444 by Drucie Ip I, RN Outcome: Progressing Goal: Cardiovascular complication will be avoided 09/21/2020 1444 by Drucie Ip I, RN Outcome: Progressing 09/21/2020 1444 by Drucie Ip I, RN Outcome: Progressing 09/21/2020 1444 by Drucie Ip I,  RN Outcome: Progressing   Problem: Activity: Goal: Risk for activity intolerance will decrease 09/21/2020 1444 by Drucie Ip I, RN Outcome: Progressing 09/21/2020 1444 by Drucie Ip I, RN Outcome: Progressing 09/21/2020 1444 by Drucie Ip I, RN Outcome: Progressing   Problem: Nutrition: Goal: Adequate nutrition will be maintained 09/21/2020 1444 by Drucie Ip I, RN Outcome: Progressing 09/21/2020 1444 by Drucie Ip I, RN Outcome: Progressing 09/21/2020 1444 by Drucie Ip I, RN Outcome: Progressing   Problem: Coping: Goal: Level of anxiety will decrease 09/21/2020 1444 by Drucie Ip I, RN Outcome: Progressing 09/21/2020 1444 by Drucie Ip I, RN Outcome: Progressing 09/21/2020 1444 by Drucie Ip I, RN Outcome: Progressing   Problem: Elimination: Goal: Will not experience complications related to bowel motility 09/21/2020 1444 by Drucie Ip I, RN Outcome: Progressing 09/21/2020 1444 by Drucie Ip I, RN Outcome: Progressing 09/21/2020 1444 by Drucie Ip I, RN Outcome: Progressing Goal: Will not experience complications related to urinary retention 09/21/2020 1444 by Drucie Ip I, RN Outcome: Progressing 09/21/2020 1444 by Drucie Ip I, RN Outcome: Progressing 09/21/2020 1444 by Drucie Ip I, RN Outcome: Progressing   Problem: Pain Managment: Goal: General experience of comfort will improve 09/21/2020 1444 by Drucie Ip I, RN Outcome: Progressing 09/21/2020 1444 by Drucie Ip I, RN Outcome: Progressing 09/21/2020 1444 by Drucie Ip I, RN Outcome: Progressing   Problem: Safety: Goal: Ability to remain free from injury will improve 09/21/2020 1444 by Drucie Ip I, RN Outcome: Progressing 09/21/2020 1444 by Drucie Ip I, RN Outcome: Progressing 09/21/2020 1444 by Drucie Ip I, RN Outcome: Progressing   Problem: Skin Integrity: Goal:  Risk for impaired skin integrity will decrease 09/21/2020 1444 by Drucie Ip I, RN Outcome: Progressing 09/21/2020 1444 by Drucie Ip I, RN Outcome: Progressing 09/21/2020 1444 by Drucie Ip I, RN Outcome: Progressing

## 2020-09-21 NOTE — TOC Progression Note (Signed)
Transition of Care [facility]) - Progression Note    Patient Details  Name: Virginia Young MRN: [id number] Date of Birth: 11/28/1940  Transition of Care [facility]) CM/SW Contact  Pollie Friar, RN Phone Number: 09/21/2020, 10:26 AM  Clinical Narrative:    Patient still with low sodium level. MD feels she will not be ready for SNF rehab until Saturday. CM has updated the daughter, pt and [facility] admissions. [facility] will have bed available for her on Saturday.  Daughter at bedside with concerns for MD and MD updated. TOC following.  Expected Discharge Plan: [place] Barriers to Discharge: Continued Medical Work up  Expected Discharge Plan and Services Expected Discharge Plan: [place] In-house Referral: Clinical Social Work Discharge Planning Services: CM Consult Post Acute Care Choice: [place] arrangements for the past 2 months: Single Family Home                                       Social Determinants of Health (SDOH) Interventions    Readmission Risk Interventions No flowsheet data found.

## 2020-09-21 NOTE — Progress Notes (Signed)
PROGRESS NOTE   Virginia Young  AOZ:[id number] DOB: [birth year] DOA: 09/15/2020 PCP: Sharion Balloon, FNP  Brief Narrative:  80 year old white female-lives alone at home P A. fib CHADS2 score >4/Eliquis Cardiomyopathy HF R EF EF 20%-->HF improved EF this admission 55-60% Prior multiple urinary infections-prior transobturator sling surgery at [facility] for prolapse BMI 34 HTN  Recent Pseudomonas UTI Rx Macrobid/Augmentin  Admit 3/25-sudden onset "dizzy"-fall forward hit head right arm injury  Hospital-Problem based course  Syncope 2/2 autonomic dysfunction MRI echo   [-] Orthostatics positive-on midodrine 5 3 times daily PT assessment regarding vestibular dysfunction pending at this time Right sided weakness Will need SNF placement Left upper extremity numbness circumscribed to palm of hand Discussed with daughter Veto Kemps is a chronic issue-patient does not do much typing This will need to be worked up in the outpatient setting with EMG studies and probably cervical spine x-rays Worsening hyponatremia-likely SIADH from severe nausea which arose from use of Augmentin Nausea was transiently better this morning-she was able to tolerate breakfast-she needs medication with Zofran however around-the-clock Trend of sodium nadir 123-fluid restricted-urine sodium 70 urine awesome however normal- Suspect T toast potomania/reset osmostat in an 80 year old Fluid restrict 1200 cc, salt tablet cut back 3/31 If sodium is closer to 129 to 130 can discharge Recurrent urinary infections (Pseudomonas) treated with Cipro most recently She may need to follow-up with her urogynecologist who performed her sling surgery in 2016 She may benefit from suppressive therapy if she has more than 3 episodes in a year?  This to be coordinated by her nurse practitioner in the outpatient setting Chronic atrial fibrillation CHADS2 score >4 Continue Eliquis  metoprolol cut back dose to 12.5 3 times  daily instead of 25 twice daily to promote less orthostasis All other antihypertensives from prior PTA (Lasix) have been held Depression Continue Seroquel 200 at bedtime, Namenda 5 twice daily, Depakote 250, Cymbalta 30 twice daily Colonic mass OP follow-up Right arm abrasion Continue wound care "bacitracin bid order to reflect covering with Xeroform and a few turns of kerlex."   DVT prophylaxis: Eliquis Code Status: Full Family Communication: Called and discussed with [name] [phone] on several days including 09/21/2020 Disposition:  Status is: Inpatient  Remains inpatient appropriate because:Hemodynamically unstable, Ongoing active pain requiring inpatient pain management, Altered mental status, Unsafe d/c plan and IV treatments appropriate due to intensity of illness or inability to take PO   Dispo: The patient is from: Home              [facility] if sodium improves and if she is keeping down p.o.              Patient currently is medically stable to d/c.   Difficult to place patient No  Consultants:     Procedures:   Significant studies: Right knee x-ray showed no fractures. Right hip x-ray showed no fracture or dislocation. Right humerus x-ray showed heterotropic ossification versus fracture of uncertain chronicity. CT of the head showed no acute findings. 09/16/2020 MRI of the brain showed no acute findings atrophic and chronic small vessel disease. 09/16/2020 MRI of the head showed no significant stenosis.  Antibiotics: None  Microbiology data: Urine culture on 09/15/2018 showed Pseudomonas sensitive to Cipro  Antimicrobials:     Subjective:  Coherent feels somewhat better eating some No chest pain no fever no chills Right arm is somewhat numb she tells me it is localized in her left  palm-I do not see any specific deficit she does have some Z-lengthening of her fingers consistent with  arthritis   Objective: Vitals:   09/20/20 1947 09/20/20 2352 09/21/20 0351 09/21/20 0754  BP: 138/86 133/68 (!) 160/89 (!) 141/75  Pulse: 72 67 69 66  Resp: 18 18 16 16   Temp: 98.9 F (37.2 C) 98.3 F (36.8 C) (!) 97.5 F (36.4 C) 98 F (36.7 C)  TempSrc:  Oral Oral Oral  SpO2: 96% 97% 98% 97%  Weight:      Height:        Intake/Output Summary (Last 24 hours) at 09/21/2020 1101 Last data filed at 09/20/2020 1234 Gross per 24 hour  Intake --  Output 600 ml  Net -600 ml   Filed Weights   09/15/20 2009 09/16/20 0138  Weight: 100.7 kg 98.2 kg    Examination:  Awake no distress EOMI NCAT-on oxygen cta b  s1 s2 no m/r/g-in afib abd obese nt nd no rebound no guard Some Le edema Neuro intact to power psych euthymic   Data Reviewed: personally reviewed   CBC    Component Value Date/Time   WBC 10.4 09/19/2020 1153   RBC 4.90 09/19/2020 1153   HGB 14.9 09/19/2020 1153   HGB 15.2 09/12/2020 1317   HCT 42.6 09/19/2020 1153   HCT 45.8 09/12/2020 1317   PLT 217 09/19/2020 1153   PLT 294 09/12/2020 1317   MCV 86.9 09/19/2020 1153   MCV 90 09/12/2020 1317   MCH 30.4 09/19/2020 1153   MCHC 35.0 09/19/2020 1153   RDW 13.2 09/19/2020 1153   RDW 12.7 09/12/2020 1317   LYMPHSABS 2.0 09/19/2020 1153   LYMPHSABS 2.4 09/12/2020 1317   MONOABS 1.1 (H) 09/19/2020 1153   EOSABS 0.2 09/19/2020 1153   EOSABS 0.3 09/12/2020 1317   BASOSABS 0.1 09/19/2020 1153   BASOSABS 0.1 09/12/2020 1317   CMP Latest Ref Rng & Units 09/21/2020 09/20/2020 09/19/2020  Glucose 70 - 99 mg/dL 94 96 96  BUN 8 - 23 mg/dL 8 5(L) 5(L)  Creatinine 0.44 - 1.00 mg/dL 0.87 0.68 0.75  Sodium 135 - 145 mmol/L 125(L) 123(L) 127(L)  Potassium 3.5 - 5.1 mmol/L 4.3 3.8 4.2  Chloride 98 - 111 mmol/L 90(L) 90(L) 92(L)  CO2 22 - 32 mmol/L 32 28 30  Calcium 8.9 - 10.3 mg/dL 8.4(L) 8.3(L) 8.7(L)  Total Protein 6.5 - 8.1 g/dL 5.2(L) - -  Total Bilirubin 0.3 - 1.2 mg/dL 0.9 - -  Alkaline Phos 38 - 126 U/L 62 - -   AST 15 - 41 U/L 20 - -  ALT 0 - 44 U/L 18 - -     Radiology Studies: No results found.   Scheduled Meds: . apixaban  5 mg Oral Q12H  . bacitracin  1 application Topical BID  . COVID-19 mRNA vaccine (Moderna)  0.25 mL Intramuscular Once  . divalproex  250 mg Oral Daily  . DULoxetine  30 mg Oral Daily  . [START ON 09/30/2020] DULoxetine  30 mg Oral BID  . linaclotide  72 mcg Oral QAC breakfast  . memantine  5 mg Oral BID  . metoprolol tartrate  12.5 mg Oral TID  . midodrine  5 mg Oral TID WC  . ondansetron  4 mg Oral Q8H  . pantoprazole  40 mg Oral BID  . QUEtiapine  25 mg Oral QHS  . [START ON 09/22/2020] sodium chloride  1 g Oral Daily  . Tdap  0.5 mL Intramuscular Once  Continuous Infusions:   LOS: 5 days   Time spent: [place], MD [facility] To contact the attending provider between 7A-7P or the covering provider during after hours 7P-7A, please log into the web site [email or web address] and access using universal [place] password for that web site. If you do not have the password, please call the hospital operator.  09/21/2020, 11:01 AM

## 2020-09-22 DIAGNOSIS — R55 Syncope and collapse: Secondary | ICD-10-CM | POA: Diagnosis not present

## 2020-09-22 LAB — COMPREHENSIVE METABOLIC PANEL
ALT: 18 U/L (ref 0–44)
AST: 25 U/L (ref 15–41)
Albumin: 2.5 g/dL — ABNORMAL LOW (ref 3.5–5.0)
Alkaline Phosphatase: 65 U/L (ref 38–126)
Anion gap: 7 (ref 5–15)
BUN: 10 mg/dL (ref 8–23)
CO2: 26 mmol/L (ref 22–32)
Calcium: 8.2 mg/dL — ABNORMAL LOW (ref 8.9–10.3)
Chloride: 93 mmol/L — ABNORMAL LOW (ref 98–111)
Creatinine, Ser: 0.78 mg/dL (ref 0.44–1.00)
GFR, Estimated: >60 mL/min (ref >60)
Glucose, Bld: 86 mg/dL (ref 70–99)
Potassium: 4.7 mmol/L (ref 3.5–5.1)
Sodium: 126 mmol/L — ABNORMAL LOW (ref 135–145)
Total Bilirubin: 1.1 mg/dL (ref 0.3–1.2)
Total Protein: 4.9 g/dL — ABNORMAL LOW (ref 6.5–8.1)

## 2020-09-22 LAB — SARS CORONAVIRUS 2 (TAT 6-24 HRS): SARS Coronavirus 2: NEGATIVE

## 2020-09-22 NOTE — Progress Notes (Signed)
Occupational Therapy Treatment Patient Details Name: Virginia Young MRN: [id number] DOB: Sep 01, 1940 Today's Date: 09/22/2020    History of present illness 80 y/o female presented to ED on 3/25 due to lightheadedness and syncopal episode. Head CT and x-rays were all negative. PMH: Afib on Eliquis, HTN, mild dementia, CHF, GERD, HLD, MS, recurrent UTIs   OT comments  OT treatment session with focus on self-care re-education, ADL transfers, and household mobility with use of AD. Patient making progress toward goals demonstrating sit to stand transfers with Min A and LB dressing in sitting/standing with Min A. Continued mild dizziness noted at rest and with change in position. Patient continues to be limited by deficits listed below including decreased activity tolerance, decreased balance, and generalized weakness and would benefit from continued acute OT services in prep for safe d/c to next level of care. Continued recommendation for SNF rehab.    Follow Up Recommendations  SNF    Equipment Recommendations  None recommended by OT    Recommendations for Other Services      Precautions / Restrictions Precautions Precautions: Fall;Other (comment) Precaution Comments: watch BP, vertiginous and nauseated Restrictions Weight Bearing Restrictions: No       Mobility Bed Mobility Overal bed mobility: Needs Assistance Bed Mobility: Sit to Supine     Supine to sit: Min assist Sit to supine: Supervision   General bed mobility comments: Patient able to control descent of trunk and bring BLE from EOB to bed surface without external assist.    Transfers Overall transfer level: Needs assistance Equipment used: Rolling walker (2 wheeled) Transfers: Sit to/from Stand Sit to Stand: Min assist Stand pivot transfers: Min assist       General transfer comment: Sit to stand from low recliner with Min A. Good recall for hand placement.    Balance Overall balance assessment: Needs  assistance Sitting-balance support: Feet supported;Bilateral upper extremity supported Sitting balance-Leahy Scale: Good Sitting balance - Comments: Able to thread BLE through underwear in unsupported sitting position without LOB.   Standing balance support: Bilateral upper extremity supported Standing balance-Leahy Scale: Poor Standing balance comment: Reliant on BUE on RW during dynamic activities. Requires at least unilateral UE support in static standing.                           ADL either performed or assessed with clinical judgement   ADL Overall ADL's : Needs assistance/impaired                     Lower Body Dressing: Minimal assistance;Sitting/lateral leans Lower Body Dressing Details (indicate cue type and reason): Patient able to thread BLE seated in recliner. Assist for steadying while hiking pants over hips and to hike underwear up in back. Toilet Transfer: Scientist, product/process development Details (indicate cue type and reason): Simulated with transfer to EOB.         Functional mobility during ADLs: Min guard;Rolling walker;Cueing for safety General ADL Comments: Patient limited by mild dizziness upon standing, decreased activity tolerance, generalized weakness, and decreased balance.     Vision       Perception     Praxis      Cognition Arousal/Alertness: Awake/alert Behavior During Therapy: WFL for tasks assessed/performed Overall Cognitive Status: History of cognitive impairments - at baseline  General Comments: Baseline dementia. Able to make needs known and follow 1-2 step verbal commands this date. Can sequence familiar tasks without difficulty.        Exercises Exercises: Other exercises Other Exercises Other Exercises: Gave and reviewed x 1 seated vertical and horizontal gaze stability exercises.  Pt has difficulty with continuous head shaking likely d/t poor sustained  attention.  PT manually assisted with the shake both vertical and horizontal which may limit how much she can do independently (recommendations by the neuro section of [facility] is to be able to work up to 20 mins (non-consecutive) gaze stability exercising per day.  This will be difficult for her to do unassisted.   Shoulder Instructions       General Comments      Pertinent Vitals/ Pain       Pain Assessment: No/denies pain  Home Living                                          Prior Functioning/Environment              Frequency  Min 2X/week        Progress Toward Goals  OT Goals(current goals can now be found in the care plan section)  Progress towards OT goals: Progressing toward goals  Acute Rehab OT Goals Patient Stated Goal: to go to rehab OT Goal Formulation: With patient Time For Goal Achievement: 09/30/20 Potential to Achieve Goals: Good ADL Goals Pt Will Perform Grooming: with supervision;standing Pt Will Transfer to Toilet: with supervision;ambulating Additional ADL Goal #1: Pt will increase to x5 mins of OOB ADL with supervisionA in standing to increase independence with ADL.  Plan Discharge plan remains appropriate;Frequency remains appropriate    Co-evaluation                 AM-PAC OT "6 Clicks" Daily Activity     Outcome Measure   Help from another person eating meals?: None Help from another person taking care of personal grooming?: A Little Help from another person toileting, which includes using toliet, bedpan, or urinal?: A Lot Help from another person bathing (including washing, rinsing, drying)?: A Lot Help from another person to put on and taking off regular upper body clothing?: A Little Help from another person to put on and taking off regular lower body clothing?: A Little 6 Click Score: 17    End of Session Equipment Utilized During Treatment: Gait belt;Rolling walker  OT Visit Diagnosis: Unsteadiness on feet  (R26.81);Muscle weakness (generalized) (M62.81)   Activity Tolerance Patient tolerated treatment well   Patient Left in bed;with call bell/phone within reach;with bed alarm set   Nurse Communication Mobility status        Time: 1354-1415 OT Time Calculation (min): 21 min  Charges: OT General Charges $OT Visit: 1 Visit OT Treatments $Therapeutic Activity: 8-22 mins  Tumeka Chimenti H. OTR/L Supplemental OT, Department of rehab services [phone]   Nirvaan Frett R H. 09/22/2020, 2:32 PM

## 2020-09-22 NOTE — Progress Notes (Signed)
PROGRESS NOTE   Virginia Young  RJJ:[id number] DOB: [birth year] DOA: 09/15/2020 PCP: Sharion Balloon, FNP  Brief Narrative:  80 year old white female-lives alone at home P A. fib CHADS2 score >4/Eliquis Cardiomyopathy HF R EF EF 20%-->HF improved EF this admission 55-60% Prior multiple urinary infections-prior transobturator sling surgery at [facility] for prolapse BMI 34 HTN  Recent Pseudomonas UTI Rx Macrobid/Augmentin  Admit 3/25-sudden onset "dizzy"-fall forward hit head right arm injury  Hospital-Problem based course  Syncope 2/2 autonomic dysfunction MRI echo   [-] Orthostatics positive-on midodrine 5 3 times daily Component of peripheral vertigo with right ear hypofunction superimposed on bilateral posterior BPPV PT assessment appreciated-habituation exercises performed Will need continued management at skilled facility Right sided weakness Will need SNF placement Left upper extremity numbness circumscribed to palm of hand Per daughter-this is a chronic issue-- requires outpatient work-up EMG etc. Worsening hyponatremia-likely SIADH from severe nausea which arose from use of Augmentin Nausea improved-ate 60% lunch-no vomit-- Zofran however around-the-clock Trend of sodium nadir 123-fluid restricted-urine sodium 70  Suspect T toast potomania/reset osmostat in an 80 year old Fluid restrict 1200 cc, salt tablet cut back 3/31 Recurrent urinary infections (Pseudomonas) treated with Cipro most recently She may need to follow-up with her urogynecologist who performed her sling surgery in 2016 Outpatient consideration suppressive ABX therapy if she has more than 3 episodes in a year--risks and benefits per urologist Chronic atrial fibrillation CHADS2 score >4 Continue Eliquis  metoprolol cut back dose to 12.5 3 times daily instead of 25 twice daily to promote less orthostasis All other antihypertensives from prior PTA (Lasix) have been held Depression Continue Seroquel  200 at bedtime, Namenda 5 twice daily, Depakote 250, Cymbalta 30 twice daily Colonic mass OP follow-up Right arm abrasion Continue wound care "bacitracin bid order to reflect covering with Xeroform and a few turns of kerlex."   DVT prophylaxis: Eliquis Code Status: Full Family Communication: Called and discussed with [name] [phone] last on 3/31 Disposition:  Status is: Inpatient  Remains inpatient appropriate because:Hemodynamically unstable, Ongoing active pain requiring inpatient pain management, Altered mental status, Unsafe d/c plan and IV treatments appropriate due to intensity of illness or inability to take PO   Dispo: The patient is from: Home              [facility] if sodium improves and if she is keeping down p.o.              Patient currently is medically stable to d/c.   Difficult to place patient No  Consultants:     Procedures:   Significant studies: Right knee x-ray showed no fractures. Right hip x-ray showed no fracture or dislocation. Right humerus x-ray showed heterotropic ossification versus fracture of uncertain chronicity. CT of the head showed no acute findings. 09/16/2020 MRI of the brain showed no acute findings atrophic and chronic small vessel disease. 09/16/2020 MRI of the head showed no significant stenosis.  Antibiotics: None  Microbiology data: Urine culture on 09/15/2018 showed Pseudomonas sensitive to Cipro  Antimicrobials:     Subjective:  Patient much improved not using oxygen eating more no chest pain no fever Tolerating bed to chair mobility but still feels quite weak  Objective: Vitals:   09/21/20 2004 09/21/20 2353 09/22/20 0409 09/22/20 0810  BP: (!) 152/64 131/81 (!) 142/73 133/64  Pulse: (!) 53 73 71 69  Resp: 20 18 20 17   Temp: 98 F (36.7 C) 98.9 F (37.2 C)  98.3 F (36.8 C) 97.8 F (36.6 C)  TempSrc:    Oral  SpO2: 97% 95% 94% 99%   Weight:      Height:        Intake/Output Summary (Last 24 hours) at 09/22/2020 1052 Last data filed at 09/22/2020 0800 Gross per 24 hour  Intake 720 ml  Output 350 ml  Net 370 ml   Filed Weights   09/15/20 2009 09/16/20 0138  Weight: 100.7 kg 98.2 kg    Examination:  EOMI NCAT no focal deficit and is coherent Power is grossly 5/5 in upper and lower extremities reflexes and sensory deferred Chest is clear no added sound Abdomen is soft no rebound no guarding Neurologically is intact with no focal deficit as above She has some mild swelling and hematomas over her upper and lower extremities nothing significant however She has no edema   Data Reviewed: personally reviewed   CBC    Component Value Date/Time   WBC 10.4 09/19/2020 1153   RBC 4.90 09/19/2020 1153   HGB 14.9 09/19/2020 1153   HGB 15.2 09/12/2020 1317   HCT 42.6 09/19/2020 1153   HCT 45.8 09/12/2020 1317   PLT 217 09/19/2020 1153   PLT 294 09/12/2020 1317   MCV 86.9 09/19/2020 1153   MCV 90 09/12/2020 1317   MCH 30.4 09/19/2020 1153   MCHC 35.0 09/19/2020 1153   RDW 13.2 09/19/2020 1153   RDW 12.7 09/12/2020 1317   LYMPHSABS 2.0 09/19/2020 1153   LYMPHSABS 2.4 09/12/2020 1317   MONOABS 1.1 (H) 09/19/2020 1153   EOSABS 0.2 09/19/2020 1153   EOSABS 0.3 09/12/2020 1317   BASOSABS 0.1 09/19/2020 1153   BASOSABS 0.1 09/12/2020 1317   CMP Latest Ref Rng & Units 09/22/2020 09/21/2020 09/20/2020  Glucose 70 - 99 mg/dL 86 94 96  BUN 8 - 23 mg/dL 10 8 5(L)  Creatinine 0.44 - 1.00 mg/dL 0.78 0.87 0.68  Sodium 135 - 145 mmol/L 126(L) 125(L) 123(L)  Potassium 3.5 - 5.1 mmol/L 4.7 4.3 3.8  Chloride 98 - 111 mmol/L 93(L) 90(L) 90(L)  CO2 22 - 32 mmol/L 26 32 28  Calcium 8.9 - 10.3 mg/dL 8.2(L) 8.4(L) 8.3(L)  Total Protein 6.5 - 8.1 g/dL 4.9(L) 5.2(L) -  Total Bilirubin 0.3 - 1.2 mg/dL 1.1 0.9 -  Alkaline Phos 38 - 126 U/L 65 62 -  AST 15 - 41 U/L 25 20 -  ALT 0 - 44 U/L 18 18 -     Radiology Studies: No  results found.   Scheduled Meds: . apixaban  5 mg Oral Q12H  . bacitracin  1 application Topical BID  . divalproex  250 mg Oral Daily  . DULoxetine  30 mg Oral Daily  . [START ON 09/30/2020] DULoxetine  30 mg Oral BID  . linaclotide  72 mcg Oral QAC breakfast  . memantine  5 mg Oral BID  . metoprolol tartrate  12.5 mg Oral TID  . midodrine  5 mg Oral TID WC  . ondansetron  4 mg Oral Q8H  . pantoprazole  40 mg Oral BID  . QUEtiapine  25 mg Oral QHS  . sodium chloride  1 g Oral Daily  . Tdap  0.5 mL Intramuscular Once   Continuous Infusions:   LOS: 6 days   Time spent: 68  Nita Sells, MD [facility] To contact the attending provider between 7A-7P or the covering provider during after hours 7P-7A, please log into the web site [email or web address] and access using  universal [place] password for that web site. If you do not have the password, please call the hospital operator.  09/22/2020, 10:52 AM

## 2020-09-22 NOTE — Progress Notes (Addendum)
Physical Therapy Treatment/Vestibular Treatment Patient Details Name: Virginia Young MRN: [id number] DOB: 12-23-1940 Today's Date: 09/22/2020    History of Present Illness 80 y/o female presented to ED on 3/25 due to lightheadedness and syncopal episode. Head CT and x-rays were all negative. PMH: Afib on Eliquis, HTN, mild dementia, CHF, GERD, HLD, MS, recurrent UTIs    PT Comments    Pt now has negative bil posterior canal dix hallpike tests, so no further canalith repositioning required today.  She does still have reports of dizziness, low grade, near constant and continues to show signs of R hypofunction.  X1 seated gaze stability exercise handout given and reviewed, however, pt has a hard time sustaining the head shaking due to poor attention.  PT was able to manually assist, but it will be difficult for pt to be able to do frequently enough to help.  It was confirmed that the SNF for rehab that she is looking at does have a vestibular therapist, so she should get some skilled follow up.  PT will continue to follow acutely for safe mobility progression. [employer] HEP: [id number]  Follow Up Recommendations  SNF (hopefully one that has a vestibular PT or OT)     Equipment Recommendations  Rolling walker with 5" wheels    Recommendations for Other Services       Precautions / Restrictions Precautions Precautions: Fall;Other (comment) Precaution Comments: watch BP, vertiginous and nauseated    Mobility  Bed Mobility Overal bed mobility: Needs Assistance Bed Mobility: Supine to Sit;Sit to Supine     Supine to sit: Min assist Sit to supine: Min assist   General bed mobility comments: Min hand held assist to come up to sitting Has to sit a moment before continuing to "get my head straight".  Reports dizziness EOB, but able to rest and continue.    Transfers Overall transfer level: Needs assistance Equipment used: Rolling walker (2 wheeled) Transfers: Sit to/from [employer] Sit to Stand: Min assist Stand pivot transfers: Min assist       General transfer comment: Pt needed much less assist with standing mobility today compared to yesterday using the assistive device (RW) helps.  Ambulation/Gait                 Stairs             Wheelchair Mobility    Modified Rankin (Stroke Patients Only)       Balance Overall balance assessment: Needs assistance Sitting-balance support: Feet supported;Bilateral upper extremity supported Sitting balance-Leahy Scale: Fair Sitting balance - Comments: continues to have a very minimal sway in initial sitting EOB.  Compensated by bil UE support on bed in sitting.  Pt reports dizziness, but does not have the posterior preference she had yesterday.   Standing balance support: Bilateral upper extremity supported Standing balance-Leahy Scale: Poor Standing balance comment: needs external support from the walker in standing and min assist from PT             09/22/20 1240  Oculomotor Exam  Gaze-induced  Left beating nystagmus with L gaze  Smooth Pursuits Saccades  Positional Testing  Dix-Hallpike Dix-Hallpike Right;Dix-Hallpike Left  Dix-Hallpike Right  Dix-Hallpike Right Duration 0  Dix-Hallpike Right Symptoms No nystagmus  Dix-Hallpike Left  Dix-Hallpike Left Duration 0  Dix-Hallpike Left Symptoms No nystagmus                    Cognition Arousal/Alertness: Awake/alert Behavior During Therapy: [facility] for tasks  assessed/performed Overall Cognitive Status: History of cognitive impairments - at baseline                                 General Comments: baseline dementia, seems fairly high functioning.      Exercises Other Exercises Other Exercises: Gave and reviewed x 1 seated vertical and horizontal gaze stability exercises.  Pt has difficulty with continuous head shaking likely d/t poor sustained attention.  PT manually assisted with the shake both  vertical and horizontal which may limit how much she can do independently (recommendations by the neuro section of [facility] is to be able to work up to 20 mins (non-consecutive) gaze stability exercising per day.  This will be difficult for her to do unassisted.    General Comments        Pertinent Vitals/Pain Pain Assessment: No/denies pain    Home Living                      Prior Function            PT Goals (current goals can now be found in the care plan section) Acute Rehab PT Goals Patient Stated Goal: to go to rehab Progress towards PT goals: Progressing toward goals    Frequency    Min 3X/week      PT Plan Current plan remains appropriate    Co-evaluation              AM-PAC PT "6 Clicks" Mobility   Outcome Measure  Help needed turning from your back to your side while in a flat bed without using bedrails?: A Little Help needed moving from lying on your back to sitting on the side of a flat bed without using bedrails?: A Little Help needed moving to and from a bed to a chair (including a wheelchair)?: A Little Help needed standing up from a chair using your arms (e.g., wheelchair or bedside chair)?: A Little Help needed to walk in hospital room?: A Little Help needed climbing 3-5 steps with a railing? : A Lot 6 Click Score: 17    End of Session   Activity Tolerance: Other (comment) (limited by nausea.) Patient left: in chair;with call bell/phone within reach;with chair alarm set Nurse Communication: Other (comment) (needs nausea meds) PT Visit Diagnosis: Unsteadiness on feet (R26.81);Muscle weakness (generalized) (M62.81);History of falling (Z91.81)     Time: 8250-0370 PT Time Calculation (min) (ACUTE ONLY): 23 min  Charges:  $Therapeutic Activity: 23-37 mins                     Verdene Lennert, PT, DPT  Acute Rehabilitation [phone] pager [phone]) [phone] office

## 2020-09-23 DIAGNOSIS — G35 Multiple sclerosis: Secondary | ICD-10-CM | POA: Diagnosis not present

## 2020-09-23 DIAGNOSIS — H8110 Benign paroxysmal vertigo, unspecified ear: Secondary | ICD-10-CM | POA: Diagnosis not present

## 2020-09-23 DIAGNOSIS — R404 Transient alteration of awareness: Secondary | ICD-10-CM | POA: Diagnosis not present

## 2020-09-23 DIAGNOSIS — E669 Obesity, unspecified: Secondary | ICD-10-CM | POA: Diagnosis not present

## 2020-09-23 DIAGNOSIS — Z741 Need for assistance with personal care: Secondary | ICD-10-CM | POA: Diagnosis not present

## 2020-09-23 DIAGNOSIS — E559 Vitamin D deficiency, unspecified: Secondary | ICD-10-CM | POA: Diagnosis not present

## 2020-09-23 DIAGNOSIS — Z7901 Long term (current) use of anticoagulants: Secondary | ICD-10-CM | POA: Diagnosis not present

## 2020-09-23 DIAGNOSIS — R2689 Other abnormalities of gait and mobility: Secondary | ICD-10-CM | POA: Diagnosis not present

## 2020-09-23 DIAGNOSIS — R42 Dizziness and giddiness: Secondary | ICD-10-CM | POA: Diagnosis not present

## 2020-09-23 DIAGNOSIS — R41841 Cognitive communication deficit: Secondary | ICD-10-CM | POA: Diagnosis not present

## 2020-09-23 DIAGNOSIS — R200 Anesthesia of skin: Secondary | ICD-10-CM | POA: Diagnosis not present

## 2020-09-23 DIAGNOSIS — N3281 Overactive bladder: Secondary | ICD-10-CM | POA: Diagnosis not present

## 2020-09-23 DIAGNOSIS — M810 Age-related osteoporosis without current pathological fracture: Secondary | ICD-10-CM | POA: Diagnosis not present

## 2020-09-23 DIAGNOSIS — R600 Localized edema: Secondary | ICD-10-CM | POA: Diagnosis not present

## 2020-09-23 DIAGNOSIS — H8101 Meniere's disease, right ear: Secondary | ICD-10-CM | POA: Diagnosis not present

## 2020-09-23 DIAGNOSIS — I739 Peripheral vascular disease, unspecified: Secondary | ICD-10-CM | POA: Diagnosis not present

## 2020-09-23 DIAGNOSIS — G8929 Other chronic pain: Secondary | ICD-10-CM | POA: Diagnosis not present

## 2020-09-23 DIAGNOSIS — I5022 Chronic systolic (congestive) heart failure: Secondary | ICD-10-CM | POA: Diagnosis not present

## 2020-09-23 DIAGNOSIS — R0989 Other specified symptoms and signs involving the circulatory and respiratory systems: Secondary | ICD-10-CM | POA: Diagnosis not present

## 2020-09-23 DIAGNOSIS — I160 Hypertensive urgency: Secondary | ICD-10-CM | POA: Diagnosis not present

## 2020-09-23 DIAGNOSIS — M2550 Pain in unspecified joint: Secondary | ICD-10-CM | POA: Diagnosis not present

## 2020-09-23 DIAGNOSIS — Z9181 History of falling: Secondary | ICD-10-CM | POA: Diagnosis not present

## 2020-09-23 DIAGNOSIS — H8113 Benign paroxysmal vertigo, bilateral: Secondary | ICD-10-CM | POA: Diagnosis not present

## 2020-09-23 DIAGNOSIS — M6281 Muscle weakness (generalized): Secondary | ICD-10-CM | POA: Diagnosis not present

## 2020-09-23 DIAGNOSIS — R55 Syncope and collapse: Secondary | ICD-10-CM | POA: Diagnosis not present

## 2020-09-23 DIAGNOSIS — R5381 Other malaise: Secondary | ICD-10-CM | POA: Diagnosis not present

## 2020-09-23 DIAGNOSIS — I110 Hypertensive heart disease with heart failure: Secondary | ICD-10-CM | POA: Diagnosis not present

## 2020-09-23 DIAGNOSIS — E871 Hypo-osmolality and hyponatremia: Secondary | ICD-10-CM | POA: Diagnosis not present

## 2020-09-23 DIAGNOSIS — R4182 Altered mental status, unspecified: Secondary | ICD-10-CM | POA: Diagnosis not present

## 2020-09-23 DIAGNOSIS — Z87440 Personal history of urinary (tract) infections: Secondary | ICD-10-CM | POA: Diagnosis not present

## 2020-09-23 DIAGNOSIS — B372 Candidiasis of skin and nail: Secondary | ICD-10-CM | POA: Diagnosis not present

## 2020-09-23 DIAGNOSIS — I10 Essential (primary) hypertension: Secondary | ICD-10-CM | POA: Diagnosis not present

## 2020-09-23 DIAGNOSIS — K219 Gastro-esophageal reflux disease without esophagitis: Secondary | ICD-10-CM | POA: Diagnosis not present

## 2020-09-23 DIAGNOSIS — Z7409 Other reduced mobility: Secondary | ICD-10-CM | POA: Diagnosis not present

## 2020-09-23 DIAGNOSIS — M1990 Unspecified osteoarthritis, unspecified site: Secondary | ICD-10-CM | POA: Diagnosis not present

## 2020-09-23 DIAGNOSIS — J189 Pneumonia, unspecified organism: Secondary | ICD-10-CM | POA: Diagnosis not present

## 2020-09-23 DIAGNOSIS — F0390 Unspecified dementia without behavioral disturbance: Secondary | ICD-10-CM | POA: Diagnosis not present

## 2020-09-23 DIAGNOSIS — K5900 Constipation, unspecified: Secondary | ICD-10-CM | POA: Diagnosis not present

## 2020-09-23 DIAGNOSIS — H269 Unspecified cataract: Secondary | ICD-10-CM | POA: Diagnosis not present

## 2020-09-23 DIAGNOSIS — N390 Urinary tract infection, site not specified: Secondary | ICD-10-CM | POA: Diagnosis not present

## 2020-09-23 DIAGNOSIS — R32 Unspecified urinary incontinence: Secondary | ICD-10-CM | POA: Diagnosis not present

## 2020-09-23 DIAGNOSIS — G459 Transient cerebral ischemic attack, unspecified: Secondary | ICD-10-CM | POA: Diagnosis not present

## 2020-09-23 DIAGNOSIS — R0602 Shortness of breath: Secondary | ICD-10-CM | POA: Diagnosis not present

## 2020-09-23 DIAGNOSIS — R601 Generalized edema: Secondary | ICD-10-CM | POA: Diagnosis not present

## 2020-09-23 DIAGNOSIS — S41111D Laceration without foreign body of right upper arm, subsequent encounter: Secondary | ICD-10-CM | POA: Diagnosis not present

## 2020-09-23 DIAGNOSIS — I4891 Unspecified atrial fibrillation: Secondary | ICD-10-CM | POA: Diagnosis not present

## 2020-09-23 DIAGNOSIS — F321 Major depressive disorder, single episode, moderate: Secondary | ICD-10-CM | POA: Diagnosis not present

## 2020-09-23 DIAGNOSIS — Z7401 Bed confinement status: Secondary | ICD-10-CM | POA: Diagnosis not present

## 2020-09-23 DIAGNOSIS — E782 Mixed hyperlipidemia: Secondary | ICD-10-CM | POA: Diagnosis not present

## 2020-09-23 LAB — COMPREHENSIVE METABOLIC PANEL
ALT: 17 U/L (ref 0–44)
AST: 18 U/L (ref 15–41)
Albumin: 2.6 g/dL — ABNORMAL LOW (ref 3.5–5.0)
Alkaline Phosphatase: 66 U/L (ref 38–126)
Anion gap: 6 (ref 5–15)
BUN: 10 mg/dL (ref 8–23)
CO2: 27 mmol/L (ref 22–32)
Calcium: 8.3 mg/dL — ABNORMAL LOW (ref 8.9–10.3)
Chloride: 94 mmol/L — ABNORMAL LOW (ref 98–111)
Creatinine, Ser: 0.82 mg/dL (ref 0.44–1.00)
GFR, Estimated: >60 mL/min (ref >60)
Glucose, Bld: 104 mg/dL — ABNORMAL HIGH (ref 70–99)
Potassium: 4.2 mmol/L (ref 3.5–5.1)
Sodium: 127 mmol/L — ABNORMAL LOW (ref 135–145)
Total Bilirubin: 0.8 mg/dL (ref 0.3–1.2)
Total Protein: 5.1 g/dL — ABNORMAL LOW (ref 6.5–8.1)

## 2020-09-23 MED ORDER — DULOXETINE HCL 30 MG PO CPEP: 30.0000 mg | ORAL_CAPSULE | Freq: Every day | ORAL | 0 refills | Status: DC

## 2020-09-23 MED ORDER — DICLOFENAC SODIUM 1 % EX GEL: 4.0000 g | Freq: Four times a day (QID) | CUTANEOUS | 0 refills | Status: DC

## 2020-09-23 MED ORDER — METOPROLOL TARTRATE 25 MG PO TABS: 12.5000 mg | ORAL_TABLET | Freq: Three times a day (TID) | ORAL | Status: DC

## 2020-09-23 MED ORDER — PANTOPRAZOLE SODIUM 40 MG PO TBEC: 40.0000 mg | DELAYED_RELEASE_TABLET | Freq: Two times a day (BID) | ORAL | Status: DC

## 2020-09-23 MED ORDER — MEMANTINE HCL 5 MG PO TABS: 5.0000 mg | ORAL_TABLET | Freq: Two times a day (BID) | ORAL | 0 refills | Status: DC

## 2020-09-23 MED ORDER — MIDODRINE HCL 5 MG PO TABS: 5.0000 mg | ORAL_TABLET | Freq: Three times a day (TID) | ORAL | Status: DC

## 2020-09-23 MED ORDER — DIVALPROEX SODIUM ER 250 MG PO TB24: 250.0000 mg | ORAL_TABLET | Freq: Every day | ORAL | 0 refills | Status: AC

## 2020-09-23 MED ORDER — QUETIAPINE FUMARATE 200 MG PO TABS: 200.0000 mg | ORAL_TABLET | Freq: Every day | ORAL | 1 refills | Status: DC

## 2020-09-23 MED ORDER — TRAMADOL HCL 50 MG PO TABS: 50.0000 mg | ORAL_TABLET | Freq: Two times a day (BID) | ORAL | 0 refills | Status: DC | PRN

## 2020-09-23 NOTE — Progress Notes (Addendum)
Called [facility] for report and was sent to voicemail. This RN will try again in 5 minutes.  RN attempted report again and spoke with Maudie Mercury. Rn left number for nurse to call back.

## 2020-09-23 NOTE — TOC Transition Note (Signed)
Transition of Care [facility]) - CM/SW Discharge Note   Patient Details  Name: ANGELY DIETZ MRN: [id number] Date of Birth: 1940-12-01  Transition of Care [facility] CM/SW Contact:  Coralee Pesa, [place] Phone Number: 09/23/2020, 10:27 AM   Clinical Narrative:    Pt to be transported to [facility]) by [facility].  Nurse to call report to [phone] Rm# [phone]   Final next level of care: Skilled Nursing Facility Barriers to Discharge: Barriers Resolved   Patient Goals and CMS Choice   CMS Medicare.gov Compare Post Acute Care list provided to:: Patient Choice offered to / list presented to : [place]  Discharge Placement              Patient chooses bed at: [facility] Patient to be transferred to facility by: [facility] Name of family member notified: Pam Patient and family notified of of transfer: 09/23/20  Discharge Plan and Services In-house Referral: Clinical Social Work Discharge Planning Services: [employer] Consult Post Acute Care Choice: [place]                               Social Determinants of Health (SDOH) Interventions     Readmission Risk Interventions No flowsheet data found.

## 2020-09-23 NOTE — Discharge Summary (Addendum)
Physician Discharge Summary  Virginia Young VZD:[id number] DOB: 04-29-1941 DOA: 09/15/2020  PCP: Sharion Balloon, FNP  Admit date: 09/15/2020 Discharge date: 09/23/2020  Time spent: 36 minutes  Recommendations for Outpatient Follow-up:  1. Recommend outpatient Chem-12 CBC 3 days at nursing facility-has reset osmole stat hyponatremia and will need more solute in diet would discharge on a regular diet until this reliably resolves-if it does not would consider alternative to Cymbalta for depression as this can sometimes cause hyponatremia 2. See change in meds as per below 3. Recommend stockings if patient can tolerate the same for dysautonomia 4. Absolutely necessary that patient gets vestibular rehab in the outpatient setting given multiple components to her dizziness and nausea 5. May need to change her Zofran temporarily to scheduled dependent on her symptoms-she was tolerating full meals on discharge but still had nausea 6. Please refer back to [facility] if she has recurrent urinary infections-Will require consideration for suppressive antibiotic therapy and discussion with her urogynecologist if this continues 7. Please consider discussion with patient about undetermined colonic pathology on CT scan as may need colonoscopy if goals are concordant with the same as she is 79  Discharge Diagnoses:  MAIN problem for hospitalization   BPPV bilateral right side predominant + orthostasis and dysautonomia  Please see below for itemized issues addressed in HOpsital- refer to other progress notes for clarity if needed  Discharge Condition: Improved  Diet recommendation: Regular  Filed Weights   09/15/20 2009 09/16/20 0138  Weight: 100.7 kg 98.2 kg    History of present illness:  80 year old white female-lives alone at home P A. fib CHADS2 score >4/Eliquis Cardiomyopathy HF R EF EF 20%-->HF improved EF this admission 55-60% Prior multiple urinary infections-prior transobturator sling  surgery at [facility] for prolapse BMI 34 HTN  Recent Pseudomonas UTI Rx Macrobid/Augmentin  Admit 3/25-sudden onset "dizzy"-fall forward hit head right arm injury  Hospital Course:  Syncope 2/2 autonomic dysfunction MRI echo   [-] Orthostatics positive-on midodrine 5 3 times daily Component of peripheral vertigo with right ear hypofunction superimposed on bilateral posterior BPPV PT assessment appreciated-habituation exercises performed Will need continued management at skilled facility Right sided weakness Will need SNF placement Left upper extremity numbness circumscribed to palm of hand Per daughter-this is a chronic issue-- requires outpatient work-up EMG etc.-I have added on discharge because of likely bony arthritis of wrist/fingers on exam diclofenac cream that can be placed topically and this can be substituted with capsaicin as per discretion of nursing home NP/physician Worsening hyponatremia-likely SIADH from severe nausea which arose from use of Augmentin Nausea improved now tolerating close to 90% of meals-no vomit-- Zofran however around-the-clock for short period of time Trend of sodium nadir 123-fluid restricted-urine sodium 70  Suspect T toast potomania/reset osmostat in an 80 year old Fluid restrict 1200 cc, salt tablet cut back 3/31 and discontinued on discharge Requires labs in 2 to 3 days Would continue few fluid restriction at facility Recurrent urinary infections (Pseudomonas) treated with Cipro most recently She may need to follow-up with her urogynecologist who performed her sling surgery in 2016 Outpatient consideration suppressive ABX therapy if she has more than 3 episodes in a year--risks and benefits per urologist Chronic atrial fibrillation CHADS2 score >4 Continue Eliquis  metoprolol cut back dose to 12.5 3 times daily instead of 25 twice daily to promote less orthostasis All other antihypertensives from prior PTA (Lasix) have been  held Depression Continue Seroquel 200 at bedtime, Namenda 5 twice daily, Depakote 250, Cymbalta  30 = daily Colonic mass OP follow-up Right arm abrasion Continue wound care "bacitracin bid order to reflect covering with Xeroform and a few turns of kerlex."  Procedures: Multiple Consultations:  Multiple  Discharge Exam: Vitals:   09/23/20 0400 09/23/20 0736  BP: (!) 145/78 (!) 157/76  Pulse: 78 67  Resp:  16  Temp: (!) 97.5 F (36.4 C) 97.8 F (36.6 C)  SpO2: 90% 92%    Subj on day of d/c   Sitting up ate almost all of her breakfast except the breakfast sausage No chest pain no fever Passing stool Still has bilateral arm/hand pain she does have hematomas over both arms and legs These are not changed from prior She seems happier and not wearing her oxygen at this time  General Exam on discharge  EOMI NCAT no focal deficit V4-M0 holosystolic murmur Chest clear no added sound no rales no rhonchi Abdomen soft slight tenderness in right lower quadrant Discomfort to wrist/fingers on handgrip likely indicative of her arthritis ROM intact lower extremities Rest of sensory deferred Psych euthymic  Discharge Instructions   Discharge Instructions    Diet - low sodium heart healthy   Complete by: As directed    Increase activity slowly   Complete by: As directed    No wound care   Complete by: As directed      Allergies as of 09/23/2020      Reactions   Bactrim [sulfamethoxazole-trimethoprim]    Mouth sores   Lipitor [atorvastatin Calcium]    myalgia   Lisinopril    cough      Medication List    STOP taking these medications   amoxicillin-clavulanate 500-125 MG tablet Commonly known as: AUGMENTIN   [employer] D3 25 MCG (1000 UT) capsule Generic drug: Cholecalciferol   furosemide 20 MG tablet Commonly known as: LASIX   ketoconazole 2 % cream Commonly known as: NIZORAL   losartan 25 MG tablet Commonly known as: COZAAR   nystatin powder Commonly known as:  MYCOSTATIN/NYSTOP   Select Disposable Underwear Lg Misc     TAKE these medications   diclofenac Sodium 1 % Gel Commonly known as: Voltaren Apply 4 g topically 4 (four) times daily. What changed:   how much to take  when to take this  reasons to take this   divalproex 250 MG 24 hr tablet Commonly known as: DEPAKOTE ER Take 1 tablet (250 mg total) by mouth daily.   DULoxetine 30 MG capsule Commonly known as: CYMBALTA Take 1 capsule (30 mg total) by mouth daily.   Eliquis 5 MG Tabs tablet Generic drug: apixaban Take 1 tablet (5 mg total) by mouth every 12 (twelve) hours.   linaclotide 72 MCG capsule Commonly known as: Linzess Take 1 capsule (72 mcg total) by mouth daily before breakfast.   memantine 5 MG tablet Commonly known as: NAMENDA Take 1 tablet (5 mg total) by mouth 2 (two) times daily.   metoprolol tartrate 25 MG tablet Commonly known as: LOPRESSOR Take 0.5 tablets (12.5 mg total) by mouth 3 (three) times daily. What changed:   how much to take  when to take this   midodrine 5 MG tablet Commonly known as: PROAMATINE Take 1 tablet (5 mg total) by mouth 3 (three) times daily with meals.   multivitamin tablet Take 1 tablet by mouth daily.   ondansetron 4 MG tablet Commonly known as: Zofran Take 1 tablet (4 mg total) by mouth every 8 (eight) hours as needed for nausea or vomiting.  OXYGEN Inhale into the lungs. 2 liters PRN - anytime when needed ( cardio rx'd)   pantoprazole 40 MG tablet Commonly known as: PROTONIX Take 1 tablet (40 mg total) by mouth 2 (two) times daily.   QUEtiapine 200 MG tablet Commonly known as: SEROquel Take 1 tablet (200 mg total) by mouth at bedtime. What changed: when to take this   traMADol 50 MG tablet Commonly known as: ULTRAM Take 1-2 tablets (50-100 mg total) by mouth every 12 (twelve) hours as needed.      Allergies  Allergen Reactions  . Bactrim [Sulfamethoxazole-Trimethoprim]     Mouth sores  . Lipitor  [Atorvastatin Calcium]     myalgia  . Lisinopril     cough    Contact information for after-discharge care    Destination    [facility] .   Service: Skilled Nursing Contact information: [address] [place] [phone]                   The results of significant diagnostics from this hospitalization (including imaging, microbiology, ancillary and laboratory) are listed below for reference.    Significant Diagnostic Studies: DG Abd 1 View  Result Date: 09/07/2020 CLINICAL DATA:  Nausea vomiting constipation. EXAM: ABDOMEN - 1 VIEW COMPARISON:  Abdominal radiograph March 18, 2017. FINDINGS: Stool noted in the colon with gas visualized in the rectum. No gaseous lead dilated loops of small bowel. Spondylosis with levoconvex curvature of the spine. IMPRESSION: No evidence of bowel obstruction. Electronically Signed   By: Dahlia Bailiff MD   On: 09/07/2020 11:51   CT HEAD WO CONTRAST  Result Date: 09/15/2020 CLINICAL DATA:  Neuro deficit, stroke suspected, right-sided numbness. EXAM: CT HEAD WITHOUT CONTRAST TECHNIQUE: Contiguous axial images were obtained from the base of the skull through the vertex without intravenous contrast. COMPARISON:  MRI brain February 27, 2018 and head CT July 25, 2016 FINDINGS: Brain: No evidence of acute large vascular territory infarction, hemorrhage, hydrocephalus, extra-axial collection or mass lesion/mass effect. Age related global parenchymal volume loss with ex vacuo dilatation of ventricular system, not significantly changed from prior. Similar mild to moderate burden of chronic small vessel white matter ischemic disease. Vascular: No hyperdense vessel. Atherosclerotic calcifications of the intracranial portions of the internal carotid arteries. Skull: Normal. Negative for fracture or focal lesion. Sinuses/Orbits: Paranasal sinuses are predominantly clear. Orbits are  unremarkable. Other: Streak artifact from dental hardware. Cerumen in the left external auditory canal. IMPRESSION: 1. No acute intracranial abnormality. 2. Similar age-related global parenchymal volume loss and chronic small vessel white matter ischemic disease. Electronically Signed   By: Dahlia Bailiff MD   On: 09/15/2020 20:58   MR ANGIO HEAD WO CONTRAST  Result Date: 09/16/2020 CLINICAL DATA:  Syncopal episode EXAM: MR HEAD WITHOUT CONTRAST MR CIRCLE OF WILLIS WITHOUT CONTRAST MRA OF THE NECK WITHOUT AND WITH CONTRAST TECHNIQUE: Multiplanar, multiecho pulse sequences of the brain, circle of willis and surrounding structures were obtained without intravenous contrast. Angiographic images of the neck were obtained using MRA technique without and with intravenous contrast. CONTRAST:  75mL GADAVIST GADOBUTROL 1 MMOL/ML IV SOLN COMPARISON:  Head CT from yesterday and brain MRI 02/27/2018 FINDINGS: MR HEAD FINDINGS Brain: No acute infarction, hemorrhage, hydrocephalus, extra-axial collection or mass lesion. Chronic small vessel ischemia in the deep cerebral white matter. Chronic lacune at the left thalamus. Generalized atrophy. Vascular: See below.  Normal flow voids Skull and upper cervical spine: Normal  marrow signal. Degenerative facet spurring. Sinuses/Orbits: Bilateral cataract resection MR CIRCLE OF WILLIS FINDINGS Mild left vertebral artery dominance. Dominant right PICA and left AICA. No significant anatomic finding in the anterior circulation. The carotid, vertebral, and basilar arteries are smooth and widely patent. Atheromatous irregularity and narrowing primarily affecting the posterior cerebral arteries and most notably affecting the left P2 segment and upper branch left PCA. No carotid branch occlusion or flow limiting stenosis. Negative for aneurysm. MRA NECK FINDINGS Incidental subcutaneous lipoma in the left paramedian posterior neck measuring 4 cm. By time-of-flight technique there is antegrade  flow in both carotid and vertebral arteries. Normal arch with 3 vessel branching. Carotid and vertebral arteries show no ulceration, stenosis, or beading. IMPRESSION: Brain MRI: 1. No emergent finding. 2. Atrophy and chronic small vessel ischemia. Intracranial MRA: No emergent finding. Atherosclerosis most notably affecting the posterior cerebral arteries with high-grade narrowing at the left P2 and P4 segments. Neck MRA: No emergent finding. No stenosis or ulceration of the major vessels. Electronically Signed   By: Monte Fantasia M.D.   On: 09/16/2020 07:27   MR ANGIO NECK W WO CONTRAST  Result Date: 09/16/2020 CLINICAL DATA:  Syncopal episode EXAM: MR HEAD WITHOUT CONTRAST MR CIRCLE OF WILLIS WITHOUT CONTRAST MRA OF THE NECK WITHOUT AND WITH CONTRAST TECHNIQUE: Multiplanar, multiecho pulse sequences of the brain, circle of willis and surrounding structures were obtained without intravenous contrast. Angiographic images of the neck were obtained using MRA technique without and with intravenous contrast. CONTRAST:  62mL GADAVIST GADOBUTROL 1 MMOL/ML IV SOLN COMPARISON:  Head CT from yesterday and brain MRI 02/27/2018 FINDINGS: MR HEAD FINDINGS Brain: No acute infarction, hemorrhage, hydrocephalus, extra-axial collection or mass lesion. Chronic small vessel ischemia in the deep cerebral white matter. Chronic lacune at the left thalamus. Generalized atrophy. Vascular: See below.  Normal flow voids Skull and upper cervical spine: Normal marrow signal. Degenerative facet spurring. Sinuses/Orbits: Bilateral cataract resection MR CIRCLE OF WILLIS FINDINGS Mild left vertebral artery dominance. Dominant right PICA and left AICA. No significant anatomic finding in the anterior circulation. The carotid, vertebral, and basilar arteries are smooth and widely patent. Atheromatous irregularity and narrowing primarily affecting the posterior cerebral arteries and most notably affecting the left P2 segment and upper branch  left PCA. No carotid branch occlusion or flow limiting stenosis. Negative for aneurysm. MRA NECK FINDINGS Incidental subcutaneous lipoma in the left paramedian posterior neck measuring 4 cm. By time-of-flight technique there is antegrade flow in both carotid and vertebral arteries. Normal arch with 3 vessel branching. Carotid and vertebral arteries show no ulceration, stenosis, or beading. IMPRESSION: Brain MRI: 1. No emergent finding. 2. Atrophy and chronic small vessel ischemia. Intracranial MRA: No emergent finding. Atherosclerosis most notably affecting the posterior cerebral arteries with high-grade narrowing at the left P2 and P4 segments. Neck MRA: No emergent finding. No stenosis or ulceration of the major vessels. Electronically Signed   By: Monte Fantasia M.D.   On: 09/16/2020 07:27   MR BRAIN WO CONTRAST  Result Date: 09/16/2020 CLINICAL DATA:  Syncopal episode EXAM: MR HEAD WITHOUT CONTRAST MR CIRCLE OF WILLIS WITHOUT CONTRAST MRA OF THE NECK WITHOUT AND WITH CONTRAST TECHNIQUE: Multiplanar, multiecho pulse sequences of the brain, circle of willis and surrounding structures were obtained without intravenous contrast. Angiographic images of the neck were obtained using MRA technique without and with intravenous contrast. CONTRAST:  47mL GADAVIST GADOBUTROL 1 MMOL/ML IV SOLN COMPARISON:  Head CT from yesterday and brain MRI 02/27/2018 FINDINGS: MR HEAD FINDINGS  Brain: No acute infarction, hemorrhage, hydrocephalus, extra-axial collection or mass lesion. Chronic small vessel ischemia in the deep cerebral white matter. Chronic lacune at the left thalamus. Generalized atrophy. Vascular: See below.  Normal flow voids Skull and upper cervical spine: Normal marrow signal. Degenerative facet spurring. Sinuses/Orbits: Bilateral cataract resection MR CIRCLE OF WILLIS FINDINGS Mild left vertebral artery dominance. Dominant right PICA and left AICA. No significant anatomic finding in the anterior circulation.  The carotid, vertebral, and basilar arteries are smooth and widely patent. Atheromatous irregularity and narrowing primarily affecting the posterior cerebral arteries and most notably affecting the left P2 segment and upper branch left PCA. No carotid branch occlusion or flow limiting stenosis. Negative for aneurysm. MRA NECK FINDINGS Incidental subcutaneous lipoma in the left paramedian posterior neck measuring 4 cm. By time-of-flight technique there is antegrade flow in both carotid and vertebral arteries. Normal arch with 3 vessel branching. Carotid and vertebral arteries show no ulceration, stenosis, or beading. IMPRESSION: Brain MRI: 1. No emergent finding. 2. Atrophy and chronic small vessel ischemia. Intracranial MRA: No emergent finding. Atherosclerosis most notably affecting the posterior cerebral arteries with high-grade narrowing at the left P2 and P4 segments. Neck MRA: No emergent finding. No stenosis or ulceration of the major vessels. Electronically Signed   By: Monte Fantasia M.D.   On: 09/16/2020 07:27   CT ABDOMEN PELVIS W CONTRAST  Result Date: 09/17/2020 CLINICAL DATA:  Abdominal pain EXAM: CT ABDOMEN AND PELVIS WITH CONTRAST TECHNIQUE: Multidetector CT imaging of the abdomen and pelvis was performed using the standard protocol following bolus administration of intravenous contrast. CONTRAST:  161mL OMNIPAQUE IOHEXOL 300 MG/ML SOLN, additional oral enteric contrast COMPARISON:  01/12/2020 FINDINGS: Lower chest: No acute abnormality. Cardiomegaly and coronary artery calcification. Trace pericardial effusion, unchanged. Dependent bibasilar scarring and or atelectasis. Hepatobiliary: No solid liver abnormality is seen. No gallstones, gallbladder wall thickening, or biliary dilatation. Pancreas: Unremarkable. No pancreatic ductal dilatation or surrounding inflammatory changes. Spleen: Normal in size without significant abnormality. Adrenals/Urinary Tract: Adrenal glands are unremarkable. There  are numerous small cortical defects of the bilateral kidneys. Kidneys are otherwise normal, without renal calculi, solid lesion, or hydronephrosis. Bladder is unremarkable. Stomach/Bowel: Stomach is within normal limits. There is masslike thickening of the cecal base adjacent to the appendiceal ostium, not clearly appreciated on prior examination (series 6, image 28). No evidence of bowel wall thickening, distention, or inflammatory changes. Vascular/Lymphatic: Aortic atherosclerosis. No enlarged abdominal or pelvic lymph nodes. Reproductive: Fluid is again seen in the endometrial cavity, although reduced in volume compared to prior examination. Calcified uterine fibroids. Other: No abdominal wall hernia or abnormality. No abdominopelvic ascites. Musculoskeletal: No acute or significant osseous findings. IMPRESSION: 1. No acute CT findings of the abdomen or pelvis to explain pain. 2. There is masslike thickening of the cecal base adjacent to the appendiceal ostium, not clearly appreciated on prior examination. This finding is concerning for malignancy. Recommend colonoscopy for further evaluation on a nonemergent basis. 3. Fluid is again seen in the endometrial cavity, although reduced in volume compared to prior examination. This remains abnormal in the postmenopausal setting and should be evaluated by ultrasound on a nonemergent basis if not previously assessed. 4. There are numerous small cortical defects of the bilateral kidneys, generally consistent with prior infectious, obstructive, or ischemic insult. 5. Coronary artery disease. Aortic Atherosclerosis (ICD10-I70.0). Electronically Signed   By: Eddie Candle M.D.   On: 09/17/2020 17:48   DG Knee Complete 4 Views Right  Result Date: 09/15/2020 CLINICAL DATA:  Pain after fall. EXAM: RIGHT KNEE - COMPLETE 4+ VIEW COMPARISON:  None. FINDINGS: No evidence of fracture, dislocation, or joint effusion. Tricompartment degenerative change of the knee most advanced  in the medial compartment where it appears moderate to severe. Extensive vascular calcifications. IMPRESSION: 1. No acute osseous abnormality. 2. Tricompartment degenerative change of the knee, most advanced in the medial compartment. Electronically Signed   By: Dahlia Bailiff MD   On: 09/15/2020 21:24   DG Humerus Right  Result Date: 09/15/2020 CLINICAL DATA:  Arm pain after fall today. EXAM: RIGHT HUMERUS - 2+ VIEW COMPARISON:  Radiograph December 23, 2017 and August 05, 2018 FINDINGS: Reverse right total shoulder arthroplasty. There are well corticated osseous fragments along the inferior aspect of the humeral head which were not definitely visualized on chest radiograph August 05, 2018. There is no evidence of image shaft or distal humeral fracture. Degenerative change at the [facility] joint with inferior acromial spurring. Soft tissues are unremarkable. IMPRESSION: Well corticated osseous fragments along the inferior aspect of the humeral head which were not definitely visualized on chest radiograph August 05, 2018. Findings which may represent heterotopic ossification versus fracture fragments of uncertain chronicity. Recommend correlation with point tenderness and possible dedicated shoulder radiographs. Electronically Signed   By: Dahlia Bailiff MD   On: 09/15/2020 21:17   ECHOCARDIOGRAM COMPLETE  Result Date: 09/16/2020    ECHOCARDIOGRAM REPORT   Patient Name:   HARRIETTA INCORVAIA Date of Exam: 09/16/2020 Medical Rec #:  [id number]          Height:       66.0 in Accession #:    [id number]         Weight:       216.5 lb Date of Birth:  [birth year]          BSA:          2.068 m Patient Age:    35 years           BP:           147/83 mmHg Patient Gender: F                  HR:           69 bpm. Exam Location:  Inpatient Procedure: 2D Echo, Cardiac Doppler, Color Doppler and Intracardiac            Opacification Agent Indications:    Stroke I63.9  History:        Patient has prior history of Echocardiogram  examinations, most                 recent 08/13/2018. Cardiomyopathy and CHF, Arrythmias:Atrial                 Fibrillation; Risk Factors:Non-Smoker.  Sonographer:    Vickie Epley RDCS Referring Phys: [id number] OLADAPO ADEFESO IMPRESSIONS  1. Pt in atrial fibrillation during the study.  2. Left ventricular ejection fraction, by estimation, is 55 to 60%. The left ventricle has normal function. The left ventricle has no regional wall motion abnormalities. Left ventricular diastolic function could not be evaluated.  3. Right ventricular systolic function is normal. The right ventricular size is normal.  4. The mitral valve is normal in structure. No evidence of mitral valve regurgitation. No evidence of mitral stenosis.  5. The aortic valve is tricuspid. Aortic valve regurgitation is trivial. Mild aortic valve sclerosis is present, with no evidence of aortic valve stenosis.  6. Aortic dilatation noted.  There is borderline dilatation of the aortic root, measuring 38 mm.  7. The inferior vena cava is normal in size with greater than 50% respiratory variability, suggesting right atrial pressure of 3 mmHg. FINDINGS  Left Ventricle: Left ventricular ejection fraction, by estimation, is 55 to 60%. The left ventricle has normal function. The left ventricle has no regional wall motion abnormalities. Definity contrast agent was given IV to delineate the left ventricular  endocardial borders. The left ventricular internal cavity size was normal in size. There is no left ventricular hypertrophy. Left ventricular diastolic function could not be evaluated due to atrial fibrillation. Left ventricular diastolic function could  not be evaluated. Right Ventricle: The right ventricular size is normal.Right ventricular systolic function is normal. Left Atrium: Left atrial size was normal in size. Right Atrium: Right atrial size was normal in size. Pericardium: Trivial pericardial effusion is present. Mitral Valve: The mitral valve is normal  in structure. No evidence of mitral valve regurgitation. No evidence of mitral valve stenosis. Tricuspid Valve: The tricuspid valve is normal in structure. Tricuspid valve regurgitation is trivial. No evidence of tricuspid stenosis. Aortic Valve: The aortic valve is tricuspid. Aortic valve regurgitation is trivial. Mild aortic valve sclerosis is present, with no evidence of aortic valve stenosis. Pulmonic Valve: The pulmonic valve was not well visualized. Pulmonic valve regurgitation is not visualized. No evidence of pulmonic stenosis. Aorta: Aortic dilatation noted. There is borderline dilatation of the aortic root, measuring 38 mm. Venous: The inferior vena cava is normal in size with greater than 50% respiratory variability, suggesting right atrial pressure of 3 mmHg.  Additional Comments: Pt in atrial fibrillation during the study.  LEFT VENTRICLE PLAX 2D LVIDd:         4.40 cm LVIDs:         3.70 cm LV PW:         0.90 cm LV IVS:        0.90 cm LVOT diam:     2.20 cm LV SV:         38 LV SV Index:   18 LVOT Area:     3.80 cm  LV Volumes (MOD) LV vol d, MOD A2C: 78.8 ml LV vol d, MOD A4C: 128.0 ml LV vol s, MOD A2C: 41.2 ml LV vol s, MOD A4C: 54.8 ml LV SV MOD A2C:     37.6 ml LV SV MOD A4C:     128.0 ml LV SV MOD BP:      58.7 ml RIGHT VENTRICLE RV S prime:     9.03 cm/s TAPSE (M-mode): 1.4 cm LEFT ATRIUM             Index       RIGHT ATRIUM           Index LA diam:        3.00 cm 1.45 cm/m  RA Area:     21.70 cm LA Vol (A2C):   65.5 ml 31.67 ml/m RA Volume:   61.60 ml  29.78 ml/m LA Vol (A4C):   45.9 ml 22.19 ml/m LA Biplane Vol: 56.9 ml 27.51 ml/m  AORTIC VALVE LVOT Vmax:   53.80 cm/s LVOT Vmean:  38.500 cm/s LVOT VTI:    0.100 m  AORTA Ao Root diam: 3.80 cm Ao Asc diam:  3.80 cm  SHUNTS Systemic VTI:  0.10 m Systemic Diam: 2.20 cm Kirk Ruths MD Electronically signed by Kirk Ruths MD Signature Date/Time: 09/16/2020/1:32:52 PM    Final  DG Hip Unilat W or Wo Pelvis 2-3 Views Right  Result  Date: 09/15/2020 CLINICAL DATA:  Pain after fall. EXAM: DG HIP (WITH OR WITHOUT PELVIS) 2-3V RIGHT COMPARISON:  Radiograph June 05, 2017. FINDINGS: There is no evidence of hip fracture or dislocation. Mild degenerative change of the hips lower lumbar spine and pubic symphysis . IMPRESSION: No fracture or dislocation of the right hip. If there is high clinical suspicion for occult fracture or the patient refuses to weightbear, consider further evaluation with MRI or CT. Electronically Signed   By: Dahlia Bailiff MD   On: 09/15/2020 21:24    Microbiology: Recent Results (from the past 240 hour(s))  Resp Panel by RT-PCR (Flu A&B, Covid) Nasopharyngeal Swab     Status: None   Collection Time: 09/15/20  9:00 PM   Specimen: Nasopharyngeal Swab; Nasopharyngeal(NP) swabs in vial transport medium  Result Value Ref Range Status   SARS Coronavirus 2 by RT PCR NEGATIVE NEGATIVE Final    Comment: (NOTE) SARS-CoV-2 target nucleic acids are NOT DETECTED.  The SARS-CoV-2 RNA is generally detectable in upper respiratory specimens during the acute phase of infection. The lowest concentration of SARS-CoV-2 viral copies this assay can detect is 138 copies/mL. A negative result does not preclude SARS-Cov-2 infection and should not be used as the sole basis for treatment or other patient management decisions. A negative result may occur with  improper specimen collection/handling, submission of specimen other than nasopharyngeal swab, presence of viral mutation(s) within the areas targeted by this assay, and inadequate number of viral copies(<138 copies/mL). A negative result must be combined with clinical observations, patient history, and epidemiological information. The expected result is Negative.  Fact Sheet for Patients:  EntrepreneurPulse.com.au  Fact Sheet for Healthcare Providers:  IncredibleEmployment.be  This test is no t yet approved or cleared by the  [place] FDA and  has been authorized for detection and/or diagnosis of SARS-CoV-2 by FDA under an Emergency Use Authorization (EUA). This EUA will remain  in effect (meaning this test can be used) for the duration of the COVID-19 declaration under Section 564(b)(1) of the Act, 21 U.S.C.section 360bbb-3(b)(1), unless the authorization is terminated  or revoked sooner.       Influenza A by PCR NEGATIVE NEGATIVE Final   Influenza B by PCR NEGATIVE NEGATIVE Final    Comment: (NOTE) The Xpert Xpress SARS-CoV-2/FLU/RSV plus assay is intended as an aid in the diagnosis of influenza from Nasopharyngeal swab specimens and should not be used as a sole basis for treatment. Nasal washings and aspirates are unacceptable for Xpert Xpress SARS-CoV-2/FLU/RSV testing.  Fact Sheet for Patients: EntrepreneurPulse.com.au  Fact Sheet for Healthcare Providers: IncredibleEmployment.be  This test is not yet approved or cleared by the [place] FDA and has been authorized for detection and/or diagnosis of SARS-CoV-2 by FDA under an Emergency Use Authorization (EUA). This EUA will remain in effect (meaning this test can be used) for the duration of the COVID-19 declaration under Section 564(b)(1) of the Act, 21 U.S.C. section 360bbb-3(b)(1), unless the authorization is terminated or revoked.  Performed at [facility], [address], [place], [place] [address]   SARS CORONAVIRUS 2 (TAT 6-24 HRS) Nasopharyngeal Nasopharyngeal Swab     Status: None   Collection Time: 09/18/20  3:12 PM   Specimen: Nasopharyngeal Swab  Result Value Ref Range Status   SARS Coronavirus 2 NEGATIVE NEGATIVE Final    Comment: (NOTE) SARS-CoV-2 target nucleic acids are NOT DETECTED.  The SARS-CoV-2 RNA is generally  detectable in upper and lower respiratory specimens during the acute phase of infection. Negative results do not preclude SARS-CoV-2 infection, do not rule  out co-infections with other pathogens, and should not be used as the sole basis for treatment or other patient management decisions. Negative results must be combined with clinical observations, patient history, and epidemiological information. The expected result is Negative.  Fact Sheet for Patients: SugarRoll.be  Fact Sheet for Healthcare Providers: https://www.woods-mathews.com/  This test is not yet approved or cleared by the [place] FDA and  has been authorized for detection and/or diagnosis of SARS-CoV-2 by FDA under an Emergency Use Authorization (EUA). This EUA will remain  in effect (meaning this test can be used) for the duration of the COVID-19 declaration under Se ction 564(b)(1) of the Act, 21 U.S.C. section 360bbb-3(b)(1), unless the authorization is terminated or revoked sooner.  Performed at [facility], [place] [address], [place], [place] [address]   SARS CORONAVIRUS 2 (TAT 6-24 HRS) Nasopharyngeal Nasopharyngeal Swab     Status: None   Collection Time: 09/22/20  1:30 PM   Specimen: Nasopharyngeal Swab  Result Value Ref Range Status   SARS Coronavirus 2 NEGATIVE NEGATIVE Final    Comment: (NOTE) SARS-CoV-2 target nucleic acids are NOT DETECTED.  The SARS-CoV-2 RNA is generally detectable in upper and lower respiratory specimens during the acute phase of infection. Negative results do not preclude SARS-CoV-2 infection, do not rule out co-infections with other pathogens, and should not be used as the sole basis for treatment or other patient management decisions. Negative results must be combined with clinical observations, patient history, and epidemiological information. The expected result is Negative.  Fact Sheet for Patients: SugarRoll.be  Fact Sheet for Healthcare Providers: https://www.woods-mathews.com/  This test is not yet approved or cleared by the  [place] FDA and  has been authorized for detection and/or diagnosis of SARS-CoV-2 by FDA under an Emergency Use Authorization (EUA). This EUA will remain  in effect (meaning this test can be used) for the duration of the COVID-19 declaration under Se ction 564(b)(1) of the Act, 21 U.S.C. section 360bbb-3(b)(1), unless the authorization is terminated or revoked sooner.  Performed at [facility], [place] [address] [place], [place] [address]      Labs: Basic Metabolic Panel: Recent Labs  Lab 09/19/20 0307 09/20/20 0431 09/21/20 0343 09/22/20 0402 09/23/20 0045  NA 127* 123* 125* 126* 127*  K 4.2 3.8 4.3 4.7 4.2  CL 92* 90* 90* 93* 94*  CO2 30 28 32 26 27  GLUCOSE 96 96 94 86 104*  BUN 5* 5* 8 10 10   CREATININE 0.75 0.68 0.87 0.78 0.82  CALCIUM 8.7* 8.3* 8.4* 8.2* 8.3*   Liver Function Tests: Recent Labs  Lab 09/21/20 0343 09/22/20 0402 09/23/20 0045  AST 20 25 18   ALT 18 18 17   ALKPHOS 62 65 66  BILITOT 0.9 1.1 0.8  PROT 5.2* 4.9* 5.1*  ALBUMIN 2.6* 2.5* 2.6*   No results for input(s): LIPASE, AMYLASE in the last 168 hours. No results for input(s): AMMONIA in the last 168 hours. CBC: Recent Labs  Lab 09/19/20 1153  WBC 10.4  NEUTROABS 6.8  HGB 14.9  HCT 42.6  MCV 86.9  PLT 217   Cardiac Enzymes: No results for input(s): CKTOTAL, CKMB, CKMBINDEX, TROPONINI in the last 168 hours. BNP: BNP (last 3 results) No results for input(s): BNP in the last 8760 hours.  ProBNP (last 3 results) No results for input(s): PROBNP  in the last 8760 hours.  CBG: No results for input(s): GLUCAP in the last 168 hours.     Signed:  Nita Sells MD   [facility] 09/23/2020, 9:31 AM

## 2020-09-27 DIAGNOSIS — R55 Syncope and collapse: Secondary | ICD-10-CM | POA: Diagnosis not present

## 2020-09-27 DIAGNOSIS — K5900 Constipation, unspecified: Secondary | ICD-10-CM | POA: Diagnosis not present

## 2020-09-27 DIAGNOSIS — R42 Dizziness and giddiness: Secondary | ICD-10-CM | POA: Diagnosis not present

## 2020-09-27 DIAGNOSIS — G8929 Other chronic pain: Secondary | ICD-10-CM | POA: Diagnosis not present

## 2020-09-27 DIAGNOSIS — E871 Hypo-osmolality and hyponatremia: Secondary | ICD-10-CM | POA: Diagnosis not present

## 2020-09-27 DIAGNOSIS — M1990 Unspecified osteoarthritis, unspecified site: Secondary | ICD-10-CM | POA: Diagnosis not present

## 2020-09-27 DIAGNOSIS — N390 Urinary tract infection, site not specified: Secondary | ICD-10-CM | POA: Diagnosis not present

## 2020-09-27 DIAGNOSIS — K219 Gastro-esophageal reflux disease without esophagitis: Secondary | ICD-10-CM | POA: Diagnosis not present

## 2020-09-27 DIAGNOSIS — H8110 Benign paroxysmal vertigo, unspecified ear: Secondary | ICD-10-CM | POA: Diagnosis not present

## 2020-09-28 DIAGNOSIS — N390 Urinary tract infection, site not specified: Secondary | ICD-10-CM | POA: Diagnosis not present

## 2020-09-28 DIAGNOSIS — I4891 Unspecified atrial fibrillation: Secondary | ICD-10-CM | POA: Diagnosis not present

## 2020-09-28 DIAGNOSIS — R42 Dizziness and giddiness: Secondary | ICD-10-CM | POA: Diagnosis not present

## 2020-09-28 DIAGNOSIS — K5900 Constipation, unspecified: Secondary | ICD-10-CM | POA: Diagnosis not present

## 2020-09-28 DIAGNOSIS — R5381 Other malaise: Secondary | ICD-10-CM | POA: Diagnosis not present

## 2020-09-28 DIAGNOSIS — R55 Syncope and collapse: Secondary | ICD-10-CM | POA: Diagnosis not present

## 2020-09-28 DIAGNOSIS — I10 Essential (primary) hypertension: Secondary | ICD-10-CM | POA: Diagnosis not present

## 2020-09-28 DIAGNOSIS — H8110 Benign paroxysmal vertigo, unspecified ear: Secondary | ICD-10-CM | POA: Diagnosis not present

## 2020-09-28 DIAGNOSIS — I5022 Chronic systolic (congestive) heart failure: Secondary | ICD-10-CM | POA: Diagnosis not present

## 2020-10-02 DIAGNOSIS — R600 Localized edema: Secondary | ICD-10-CM | POA: Diagnosis not present

## 2020-10-02 DIAGNOSIS — R42 Dizziness and giddiness: Secondary | ICD-10-CM | POA: Diagnosis not present

## 2020-10-02 DIAGNOSIS — R200 Anesthesia of skin: Secondary | ICD-10-CM | POA: Diagnosis not present

## 2020-10-02 DIAGNOSIS — K5900 Constipation, unspecified: Secondary | ICD-10-CM | POA: Diagnosis not present

## 2020-10-02 DIAGNOSIS — I5022 Chronic systolic (congestive) heart failure: Secondary | ICD-10-CM | POA: Diagnosis not present

## 2020-10-02 DIAGNOSIS — R5381 Other malaise: Secondary | ICD-10-CM | POA: Diagnosis not present

## 2020-10-02 DIAGNOSIS — R55 Syncope and collapse: Secondary | ICD-10-CM | POA: Diagnosis not present

## 2020-10-02 DIAGNOSIS — I10 Essential (primary) hypertension: Secondary | ICD-10-CM | POA: Diagnosis not present

## 2020-10-04 DIAGNOSIS — H8101 Meniere's disease, right ear: Secondary | ICD-10-CM | POA: Diagnosis not present

## 2020-10-04 DIAGNOSIS — R42 Dizziness and giddiness: Secondary | ICD-10-CM | POA: Diagnosis not present

## 2020-10-04 DIAGNOSIS — N390 Urinary tract infection, site not specified: Secondary | ICD-10-CM | POA: Diagnosis not present

## 2020-10-04 DIAGNOSIS — G8929 Other chronic pain: Secondary | ICD-10-CM | POA: Diagnosis not present

## 2020-10-04 DIAGNOSIS — R600 Localized edema: Secondary | ICD-10-CM | POA: Diagnosis not present

## 2020-10-04 DIAGNOSIS — R200 Anesthesia of skin: Secondary | ICD-10-CM | POA: Diagnosis not present

## 2020-10-04 DIAGNOSIS — K5900 Constipation, unspecified: Secondary | ICD-10-CM | POA: Diagnosis not present

## 2020-10-04 DIAGNOSIS — M1990 Unspecified osteoarthritis, unspecified site: Secondary | ICD-10-CM | POA: Diagnosis not present

## 2020-10-04 DIAGNOSIS — R55 Syncope and collapse: Secondary | ICD-10-CM | POA: Diagnosis not present

## 2020-10-09 DIAGNOSIS — R600 Localized edema: Secondary | ICD-10-CM | POA: Diagnosis not present

## 2020-10-09 DIAGNOSIS — I5022 Chronic systolic (congestive) heart failure: Secondary | ICD-10-CM | POA: Diagnosis not present

## 2020-10-09 DIAGNOSIS — N390 Urinary tract infection, site not specified: Secondary | ICD-10-CM | POA: Diagnosis not present

## 2020-10-09 DIAGNOSIS — R5381 Other malaise: Secondary | ICD-10-CM | POA: Diagnosis not present

## 2020-10-09 DIAGNOSIS — E871 Hypo-osmolality and hyponatremia: Secondary | ICD-10-CM | POA: Diagnosis not present

## 2020-10-09 DIAGNOSIS — I4891 Unspecified atrial fibrillation: Secondary | ICD-10-CM | POA: Diagnosis not present

## 2020-10-09 DIAGNOSIS — R42 Dizziness and giddiness: Secondary | ICD-10-CM | POA: Diagnosis not present

## 2020-10-09 DIAGNOSIS — I10 Essential (primary) hypertension: Secondary | ICD-10-CM | POA: Diagnosis not present

## 2020-10-09 DIAGNOSIS — B372 Candidiasis of skin and nail: Secondary | ICD-10-CM | POA: Diagnosis not present

## 2020-10-11 DIAGNOSIS — R0602 Shortness of breath: Secondary | ICD-10-CM | POA: Diagnosis not present

## 2020-10-11 DIAGNOSIS — B372 Candidiasis of skin and nail: Secondary | ICD-10-CM | POA: Diagnosis not present

## 2020-10-11 DIAGNOSIS — R32 Unspecified urinary incontinence: Secondary | ICD-10-CM | POA: Diagnosis not present

## 2020-10-11 DIAGNOSIS — R5381 Other malaise: Secondary | ICD-10-CM | POA: Diagnosis not present

## 2020-10-11 DIAGNOSIS — I5022 Chronic systolic (congestive) heart failure: Secondary | ICD-10-CM | POA: Diagnosis not present

## 2020-10-11 DIAGNOSIS — E871 Hypo-osmolality and hyponatremia: Secondary | ICD-10-CM | POA: Diagnosis not present

## 2020-10-11 DIAGNOSIS — G8929 Other chronic pain: Secondary | ICD-10-CM | POA: Diagnosis not present

## 2020-10-11 DIAGNOSIS — R600 Localized edema: Secondary | ICD-10-CM | POA: Diagnosis not present

## 2020-10-11 DIAGNOSIS — M1990 Unspecified osteoarthritis, unspecified site: Secondary | ICD-10-CM | POA: Diagnosis not present

## 2020-10-11 DIAGNOSIS — I10 Essential (primary) hypertension: Secondary | ICD-10-CM | POA: Diagnosis not present

## 2020-10-16 DIAGNOSIS — B372 Candidiasis of skin and nail: Secondary | ICD-10-CM | POA: Diagnosis not present

## 2020-10-16 DIAGNOSIS — R32 Unspecified urinary incontinence: Secondary | ICD-10-CM | POA: Diagnosis not present

## 2020-10-16 DIAGNOSIS — I10 Essential (primary) hypertension: Secondary | ICD-10-CM | POA: Diagnosis not present

## 2020-10-16 DIAGNOSIS — K5900 Constipation, unspecified: Secondary | ICD-10-CM | POA: Diagnosis not present

## 2020-10-16 DIAGNOSIS — R5381 Other malaise: Secondary | ICD-10-CM | POA: Diagnosis not present

## 2020-10-16 DIAGNOSIS — R600 Localized edema: Secondary | ICD-10-CM | POA: Diagnosis not present

## 2020-10-16 DIAGNOSIS — I5022 Chronic systolic (congestive) heart failure: Secondary | ICD-10-CM | POA: Diagnosis not present

## 2020-10-16 DIAGNOSIS — J189 Pneumonia, unspecified organism: Secondary | ICD-10-CM | POA: Diagnosis not present

## 2020-10-16 DIAGNOSIS — R55 Syncope and collapse: Secondary | ICD-10-CM | POA: Diagnosis not present

## 2020-10-16 DIAGNOSIS — R0989 Other specified symptoms and signs involving the circulatory and respiratory systems: Secondary | ICD-10-CM | POA: Diagnosis not present

## 2020-10-16 DIAGNOSIS — R200 Anesthesia of skin: Secondary | ICD-10-CM | POA: Diagnosis not present

## 2020-10-18 DIAGNOSIS — R200 Anesthesia of skin: Secondary | ICD-10-CM | POA: Diagnosis not present

## 2020-10-18 DIAGNOSIS — R600 Localized edema: Secondary | ICD-10-CM | POA: Diagnosis not present

## 2020-10-18 DIAGNOSIS — N390 Urinary tract infection, site not specified: Secondary | ICD-10-CM | POA: Diagnosis not present

## 2020-10-18 DIAGNOSIS — H8110 Benign paroxysmal vertigo, unspecified ear: Secondary | ICD-10-CM | POA: Diagnosis not present

## 2020-10-18 DIAGNOSIS — I5022 Chronic systolic (congestive) heart failure: Secondary | ICD-10-CM | POA: Diagnosis not present

## 2020-10-18 DIAGNOSIS — J189 Pneumonia, unspecified organism: Secondary | ICD-10-CM | POA: Diagnosis not present

## 2020-10-18 DIAGNOSIS — B372 Candidiasis of skin and nail: Secondary | ICD-10-CM | POA: Diagnosis not present

## 2020-10-18 DIAGNOSIS — R0989 Other specified symptoms and signs involving the circulatory and respiratory systems: Secondary | ICD-10-CM | POA: Diagnosis not present

## 2020-10-18 DIAGNOSIS — K5900 Constipation, unspecified: Secondary | ICD-10-CM | POA: Diagnosis not present

## 2020-10-18 DIAGNOSIS — R32 Unspecified urinary incontinence: Secondary | ICD-10-CM | POA: Diagnosis not present

## 2020-10-18 DIAGNOSIS — R55 Syncope and collapse: Secondary | ICD-10-CM | POA: Diagnosis not present

## 2020-10-26 ENCOUNTER — Other Ambulatory Visit: Payer: Self-pay

## 2020-10-30 DIAGNOSIS — R55 Syncope and collapse: Secondary | ICD-10-CM | POA: Diagnosis not present

## 2020-10-30 DIAGNOSIS — I5022 Chronic systolic (congestive) heart failure: Secondary | ICD-10-CM | POA: Diagnosis not present

## 2020-10-30 DIAGNOSIS — M1990 Unspecified osteoarthritis, unspecified site: Secondary | ICD-10-CM | POA: Diagnosis not present

## 2020-10-30 DIAGNOSIS — K5900 Constipation, unspecified: Secondary | ICD-10-CM | POA: Diagnosis not present

## 2020-10-30 DIAGNOSIS — R200 Anesthesia of skin: Secondary | ICD-10-CM | POA: Diagnosis not present

## 2020-10-30 DIAGNOSIS — R32 Unspecified urinary incontinence: Secondary | ICD-10-CM | POA: Diagnosis not present

## 2020-10-30 DIAGNOSIS — K219 Gastro-esophageal reflux disease without esophagitis: Secondary | ICD-10-CM | POA: Diagnosis not present

## 2020-10-30 DIAGNOSIS — J189 Pneumonia, unspecified organism: Secondary | ICD-10-CM | POA: Diagnosis not present

## 2020-10-30 DIAGNOSIS — H8110 Benign paroxysmal vertigo, unspecified ear: Secondary | ICD-10-CM | POA: Diagnosis not present

## 2020-10-30 DIAGNOSIS — R600 Localized edema: Secondary | ICD-10-CM | POA: Diagnosis not present

## 2020-11-01 ENCOUNTER — Ambulatory Visit: Payer: Medicare Other | Admitting: Cardiology

## 2020-11-01 DIAGNOSIS — F32A Depression, unspecified: Secondary | ICD-10-CM | POA: Diagnosis not present

## 2020-11-01 DIAGNOSIS — I4891 Unspecified atrial fibrillation: Secondary | ICD-10-CM | POA: Diagnosis not present

## 2020-11-01 DIAGNOSIS — F0280 Dementia in other diseases classified elsewhere without behavioral disturbance: Secondary | ICD-10-CM | POA: Diagnosis not present

## 2020-11-01 DIAGNOSIS — R55 Syncope and collapse: Secondary | ICD-10-CM | POA: Diagnosis not present

## 2020-11-01 DIAGNOSIS — M810 Age-related osteoporosis without current pathological fracture: Secondary | ICD-10-CM | POA: Diagnosis not present

## 2020-11-01 DIAGNOSIS — I10 Essential (primary) hypertension: Secondary | ICD-10-CM | POA: Diagnosis not present

## 2020-11-01 DIAGNOSIS — G35 Multiple sclerosis: Secondary | ICD-10-CM | POA: Diagnosis not present

## 2020-11-01 DIAGNOSIS — H8113 Benign paroxysmal vertigo, bilateral: Secondary | ICD-10-CM | POA: Diagnosis not present

## 2020-11-01 DIAGNOSIS — W01198S Fall on same level from slipping, tripping and stumbling with subsequent striking against other object, sequela: Secondary | ICD-10-CM | POA: Diagnosis not present

## 2020-11-01 DIAGNOSIS — Z7901 Long term (current) use of anticoagulants: Secondary | ICD-10-CM | POA: Diagnosis not present

## 2020-11-01 DIAGNOSIS — I110 Hypertensive heart disease with heart failure: Secondary | ICD-10-CM | POA: Diagnosis not present

## 2020-11-01 DIAGNOSIS — Z87440 Personal history of urinary (tract) infections: Secondary | ICD-10-CM | POA: Diagnosis not present

## 2020-11-01 DIAGNOSIS — K5900 Constipation, unspecified: Secondary | ICD-10-CM | POA: Diagnosis not present

## 2020-11-01 DIAGNOSIS — I5022 Chronic systolic (congestive) heart failure: Secondary | ICD-10-CM | POA: Diagnosis not present

## 2020-11-01 DIAGNOSIS — N390 Urinary tract infection, site not specified: Secondary | ICD-10-CM | POA: Diagnosis not present

## 2020-11-01 DIAGNOSIS — G8929 Other chronic pain: Secondary | ICD-10-CM | POA: Diagnosis not present

## 2020-11-01 DIAGNOSIS — R42 Dizziness and giddiness: Secondary | ICD-10-CM | POA: Diagnosis not present

## 2020-11-01 DIAGNOSIS — Z9181 History of falling: Secondary | ICD-10-CM | POA: Diagnosis not present

## 2020-11-01 DIAGNOSIS — R600 Localized edema: Secondary | ICD-10-CM | POA: Diagnosis not present

## 2020-11-01 DIAGNOSIS — H8110 Benign paroxysmal vertigo, unspecified ear: Secondary | ICD-10-CM | POA: Diagnosis not present

## 2020-11-01 DIAGNOSIS — M1990 Unspecified osteoarthritis, unspecified site: Secondary | ICD-10-CM | POA: Diagnosis not present

## 2020-11-02 DIAGNOSIS — Z79899 Other long term (current) drug therapy: Secondary | ICD-10-CM | POA: Diagnosis not present

## 2020-11-02 DIAGNOSIS — I5021 Acute systolic (congestive) heart failure: Secondary | ICD-10-CM | POA: Diagnosis not present

## 2020-11-02 DIAGNOSIS — E559 Vitamin D deficiency, unspecified: Secondary | ICD-10-CM | POA: Diagnosis not present

## 2020-11-02 DIAGNOSIS — E119 Type 2 diabetes mellitus without complications: Secondary | ICD-10-CM | POA: Diagnosis not present

## 2020-11-02 DIAGNOSIS — E7849 Other hyperlipidemia: Secondary | ICD-10-CM | POA: Diagnosis not present

## 2020-11-02 DIAGNOSIS — D518 Other vitamin B12 deficiency anemias: Secondary | ICD-10-CM | POA: Diagnosis not present

## 2020-11-02 DIAGNOSIS — E038 Other specified hypothyroidism: Secondary | ICD-10-CM | POA: Diagnosis not present

## 2020-11-06 ENCOUNTER — Encounter: Payer: Self-pay | Admitting: Family Medicine

## 2020-11-06 ENCOUNTER — Ambulatory Visit (INDEPENDENT_AMBULATORY_CARE_PROVIDER_SITE_OTHER): Payer: Medicare Other | Admitting: Family Medicine

## 2020-11-06 VITALS — BP 147/88 | HR 93 | Ht 66.0 in | Wt 223.0 lb

## 2020-11-06 DIAGNOSIS — R42 Dizziness and giddiness: Secondary | ICD-10-CM

## 2020-11-06 DIAGNOSIS — R531 Weakness: Secondary | ICD-10-CM | POA: Diagnosis not present

## 2020-11-06 DIAGNOSIS — R569 Unspecified convulsions: Secondary | ICD-10-CM | POA: Diagnosis not present

## 2020-11-06 DIAGNOSIS — F0391 Unspecified dementia with behavioral disturbance: Secondary | ICD-10-CM | POA: Diagnosis not present

## 2020-11-06 NOTE — Patient Instructions (Signed)
Below is our plan:  We will continue to monitor memory. Stay physically and mentally active. Consider increasing memantine to 10mg  twice daily as directed by PCP. Consider PT/OT outpatient for right lower extremity weakness.   Please make sure you are staying well hydrated. I recommend 50-60 ounces daily. Well balanced diet and regular exercise encouraged. Consistent sleep schedule with 6-8 hours recommended.   Please continue follow up with care team as directed.   Follow up with me in 6 months   You may receive a survey regarding today's visit. I encourage you to leave honest feed back as I do use this information to improve patient care. Thank you for seeing me today!      Management of Memory Problems   There are some general things you can do to help manage your memory problems.  Your memory may not in fact recover, but by using techniques and strategies you will be able to manage your memory difficulties better.   1)  Establish a routine. ? Try to establish and then stick to a regular routine.  By doing this, you will get used to what to expect and you will reduce the need to rely on your memory.  Also, try to do things at the same time of day, such as taking your medication or checking your calendar first thing in the morning. ? Think about think that you can do as a part of a regular routine and make a list.  Then enter them into a daily planner to remind you.  This will help you establish a routine.   2)  Organize your environment. ? Organize your environment so that it is uncluttered.  Decrease visual stimulation.  Place everyday items such as keys or cell phone in the same place every day (ie.  Basket next to front door) ? Use post it notes with a brief message to yourself (ie. Turn off light, lock the door) ? Use labels to indicate where things go (ie. Which cupboards are for food, dishes, etc.) ? Keep a notepad and pen by the telephone to take messages   3)  Memory  Aids ? A diary or journal/notebook/daily planner ? Making a list (shopping list, chore list, to do list that needs to be done) ? Using an alarm as a reminder (kitchen timer or cell phone alarm) ? Using cell phone to store information (Notes, Calendar, Reminders) ? Calendar/White board placed in a prominent position ? Post-it notes   In order for memory aids to be useful, you need to have good habits.  It's no good remembering to make a note in your journal if you don't remember to look in it.  Try setting aside a certain time of day to look in journal.   4)  Improving mood and managing fatigue. 1. There may be other factors that contribute to memory difficulties.  Factors, such as anxiety, depression and tiredness can affect memory.  Regular gentle exercise can help improve your mood and give you more energy.  Simple relaxation techniques may help relieve symptoms of anxiety  Try to get back to completing activities or hobbies you enjoyed doing in the past.  Learn to pace yourself through activities to decrease fatigue.  Find out about some local support groups where you can share experiences with others.  Try and achieve 7-8 hours of sleep at night.   Stroke Prevention Some medical conditions and lifestyle choices can lead to a higher risk for a stroke. You can  help to prevent a stroke by eating healthy foods and exercising. It also helps to not smoke and to manage any health problems you may have. How can this condition affect me? A stroke is an emergency. It should be treated right away. A stroke can lead to brain damage or threaten your life. There is a better chance of surviving and getting better after a stroke if you get medical help right away. What can increase my risk? The following medical conditions may increase your risk of a stroke:  Diseases of the heart and blood vessels (cardiovascular disease).  High blood pressure (hypertension).  Diabetes.  High  cholesterol.  Sickle cell disease.  Problems with blood clotting.  Being very overweight.  Sleeping problems (obstructivesleep apnea). Other risk factors include:  Being older than age 69.  A history of blood clots, stroke, or mini-stroke (TIA).  Race, ethnic background, or a family history of stroke.  Smoking or using tobacco products.  Taking birth control pills, especially if you smoke.  Heavy alcohol and drug use.  Not being active. What actions can I take to prevent this? Manage your health conditions  High cholesterol. ? Eat a healthy diet. If this is not enough to manage your cholesterol, you may need to take medicines. ? Take medicines as told by your doctor.  High blood pressure. ? Try to keep your blood pressure below 130/80. ? If your blood pressure cannot be managed through a healthy diet and regular exercise, you may need to take medicines. ? Take medicines as told by your doctor. ? Ask your doctor if you should check your blood pressure at home. ? Have your blood pressure checked every year.  Diabetes. ? Eat a healthy diet and get regular exercise. If your blood sugar (glucose) cannot be managed through diet and exercise, you may need to take medicines. ? Take medicines as told by your doctor.  Talk to your doctor about getting checked for sleeping problems. Signs of a problem can include: ? Snoring a lot. ? Feeling very tired.  Make sure that you manage any other conditions you have. Nutrition  Follow instructions from your doctor about what to eat or drink. You may be told to: ? Eat and drink fewer calories each day. ? Limit how much salt (sodium) you use to 1,500 milligrams (mg) each day. ? Use only healthy fats for cooking, such as olive oil, canola oil, and sunflower oil. ? Eat healthy foods. To do this:  Choose foods that are high in fiber. These include whole grains, and fresh fruits and vegetables.  Eat at least 5 servings of fruits and  vegetables a day. Try to fill one-half of your plate with fruits and vegetables at each meal.  Choose low-fat (lean) proteins. These include low-fat cuts of meat, chicken without skin, fish, tofu, beans, and nuts.  Eat low-fat dairy products. ? Avoid foods that:  Are high in salt.  Have saturated fat.  Have trans fat.  Have cholesterol.  Are processed or pre-made. ? Count how many carbohydrates you eat and drink each day.   Lifestyle  If you drink alcohol: ? Limit how much you have to:  0-1 drink a day for women who are not pregnant.  0-2 drinks a day for men. ? Know how much alcohol is in your drink. In the [place] one drink equals one 12 oz bottle of beer (375mL), one 5 oz glass of wine (137mL), or one 1 oz glass of hard liquor (  14mL).  Do not smoke or use any products that have nicotine or tobacco. If you need help quitting, ask your doctor.  Avoid secondhand smoke.  Do not use drugs. Activity  Try to stay at a healthy weight.  Get at least 30 minutes of exercise on most days, such as: ? Fast walking. ? Biking. ? Swimming.   Medicines  Take over-the-counter and prescription medicines only as told by your doctor.  Avoid taking birth control pills. Talk to your doctor about the risks of taking birth control pills if: ? You are over 56 years old. ? You smoke. ? You get very bad headaches. ? You have had a blood clot. Where to find more information  American Stroke Association: www.strokeassociation.org Get help right away if:  You or a loved one has any signs of a stroke. "BE FAST" is an easy way to remember the warning signs: ? B - Balance. Dizziness, sudden trouble walking, or loss of balance. ? E - Eyes. Trouble seeing or a change in how you see. ? F - Face. Sudden weakness or loss of feeling of the face. The face or eyelid may droop on one side. ? A - Arms. Weakness or loss of feeling in an arm. This happens all of a sudden and most often on one side of  the body. ? S - Speech. Sudden trouble speaking, slurred speech, or trouble understanding what people say. ? T - Time. Time to call emergency services. Write down what time symptoms started.  You or a loved one has other signs of a stroke, such as: ? A sudden, very bad headache with no known cause. ? Feeling like you may vomit (nausea). ? Vomiting. ? A seizure. These symptoms may be an emergency. Get help right away. Call your local emergency services (911 in the [place]  Do not wait to see if the symptoms will go away.  Do not drive yourself to the hospital. Summary  You can help to prevent a stroke by eating healthy, exercising, and not smoking. It also helps to manage any health problems you have.  Do not smoke or use any products that contain nicotine or tobacco.  Get help right away if you or a loved one has any signs of a stroke. This information is not intended to replace advice given to you by your health care provider. Make sure you discuss any questions you have with your health care provider. Document Revised: 01/10/2020 Document Reviewed: 01/10/2020 Elsevier Patient Education  [place].
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
## 2020-11-08 DIAGNOSIS — E559 Vitamin D deficiency, unspecified: Secondary | ICD-10-CM | POA: Diagnosis not present

## 2020-11-08 DIAGNOSIS — Z79899 Other long term (current) drug therapy: Secondary | ICD-10-CM | POA: Diagnosis not present

## 2020-11-08 DIAGNOSIS — I5022 Chronic systolic (congestive) heart failure: Secondary | ICD-10-CM | POA: Diagnosis not present

## 2020-11-16 ENCOUNTER — Other Ambulatory Visit: Payer: Self-pay | Admitting: Family

## 2020-11-16 DIAGNOSIS — I480 Paroxysmal atrial fibrillation: Secondary | ICD-10-CM

## 2020-11-16 DIAGNOSIS — R4189 Other symptoms and signs involving cognitive functions and awareness: Secondary | ICD-10-CM | POA: Diagnosis not present

## 2020-11-16 DIAGNOSIS — F0390 Unspecified dementia without behavioral disturbance: Secondary | ICD-10-CM | POA: Diagnosis not present

## 2020-11-16 DIAGNOSIS — F329 Major depressive disorder, single episode, unspecified: Secondary | ICD-10-CM | POA: Diagnosis not present

## 2020-11-16 DIAGNOSIS — F5102 Adjustment insomnia: Secondary | ICD-10-CM | POA: Diagnosis not present

## 2020-11-21 DIAGNOSIS — M810 Age-related osteoporosis without current pathological fracture: Secondary | ICD-10-CM | POA: Diagnosis not present

## 2020-11-21 DIAGNOSIS — I110 Hypertensive heart disease with heart failure: Secondary | ICD-10-CM | POA: Diagnosis not present

## 2020-11-21 DIAGNOSIS — G8929 Other chronic pain: Secondary | ICD-10-CM | POA: Diagnosis not present

## 2020-11-21 DIAGNOSIS — G35 Multiple sclerosis: Secondary | ICD-10-CM | POA: Diagnosis not present

## 2020-11-21 DIAGNOSIS — H8113 Benign paroxysmal vertigo, bilateral: Secondary | ICD-10-CM | POA: Diagnosis not present

## 2020-11-21 DIAGNOSIS — I5022 Chronic systolic (congestive) heart failure: Secondary | ICD-10-CM | POA: Diagnosis not present

## 2020-11-21 DIAGNOSIS — F0280 Dementia in other diseases classified elsewhere without behavioral disturbance: Secondary | ICD-10-CM | POA: Diagnosis not present

## 2020-11-21 DIAGNOSIS — I4891 Unspecified atrial fibrillation: Secondary | ICD-10-CM | POA: Diagnosis not present

## 2020-11-23 ENCOUNTER — Encounter: Payer: Self-pay | Admitting: *Deleted

## 2020-11-29 DIAGNOSIS — F418 Other specified anxiety disorders: Secondary | ICD-10-CM | POA: Diagnosis not present

## 2020-11-29 DIAGNOSIS — H8110 Benign paroxysmal vertigo, unspecified ear: Secondary | ICD-10-CM | POA: Diagnosis not present

## 2020-11-29 DIAGNOSIS — I5022 Chronic systolic (congestive) heart failure: Secondary | ICD-10-CM | POA: Diagnosis not present

## 2020-11-29 DIAGNOSIS — I10 Essential (primary) hypertension: Secondary | ICD-10-CM | POA: Diagnosis not present

## 2020-11-29 DIAGNOSIS — I4891 Unspecified atrial fibrillation: Secondary | ICD-10-CM | POA: Diagnosis not present

## 2020-11-29 DIAGNOSIS — F0390 Unspecified dementia without behavioral disturbance: Secondary | ICD-10-CM | POA: Diagnosis not present

## 2020-11-29 DIAGNOSIS — K6389 Other specified diseases of intestine: Secondary | ICD-10-CM | POA: Diagnosis not present

## 2020-11-29 DIAGNOSIS — G8929 Other chronic pain: Secondary | ICD-10-CM | POA: Diagnosis not present

## 2020-11-29 DIAGNOSIS — R5381 Other malaise: Secondary | ICD-10-CM | POA: Diagnosis not present

## 2020-11-29 DIAGNOSIS — K219 Gastro-esophageal reflux disease without esophagitis: Secondary | ICD-10-CM | POA: Diagnosis not present

## 2020-11-29 DIAGNOSIS — M1990 Unspecified osteoarthritis, unspecified site: Secondary | ICD-10-CM | POA: Diagnosis not present

## 2020-12-01 DIAGNOSIS — W01198S Fall on same level from slipping, tripping and stumbling with subsequent striking against other object, sequela: Secondary | ICD-10-CM | POA: Diagnosis not present

## 2020-12-01 DIAGNOSIS — I5022 Chronic systolic (congestive) heart failure: Secondary | ICD-10-CM | POA: Diagnosis not present

## 2020-12-01 DIAGNOSIS — Z9181 History of falling: Secondary | ICD-10-CM | POA: Diagnosis not present

## 2020-12-01 DIAGNOSIS — M1990 Unspecified osteoarthritis, unspecified site: Secondary | ICD-10-CM | POA: Diagnosis not present

## 2020-12-01 DIAGNOSIS — M810 Age-related osteoporosis without current pathological fracture: Secondary | ICD-10-CM | POA: Diagnosis not present

## 2020-12-01 DIAGNOSIS — G35 Multiple sclerosis: Secondary | ICD-10-CM | POA: Diagnosis not present

## 2020-12-01 DIAGNOSIS — Z87440 Personal history of urinary (tract) infections: Secondary | ICD-10-CM | POA: Diagnosis not present

## 2020-12-01 DIAGNOSIS — H8113 Benign paroxysmal vertigo, bilateral: Secondary | ICD-10-CM | POA: Diagnosis not present

## 2020-12-01 DIAGNOSIS — I4891 Unspecified atrial fibrillation: Secondary | ICD-10-CM | POA: Diagnosis not present

## 2020-12-01 DIAGNOSIS — I110 Hypertensive heart disease with heart failure: Secondary | ICD-10-CM | POA: Diagnosis not present

## 2020-12-01 DIAGNOSIS — G8929 Other chronic pain: Secondary | ICD-10-CM | POA: Diagnosis not present

## 2020-12-01 DIAGNOSIS — Z7901 Long term (current) use of anticoagulants: Secondary | ICD-10-CM | POA: Diagnosis not present

## 2020-12-01 DIAGNOSIS — F32A Depression, unspecified: Secondary | ICD-10-CM | POA: Diagnosis not present

## 2020-12-01 DIAGNOSIS — F0280 Dementia in other diseases classified elsewhere without behavioral disturbance: Secondary | ICD-10-CM | POA: Diagnosis not present

## 2020-12-08 ENCOUNTER — Ambulatory Visit: Payer: Medicare Other | Admitting: Family

## 2020-12-10 ENCOUNTER — Other Ambulatory Visit: Payer: Self-pay | Admitting: Family

## 2020-12-10 DIAGNOSIS — E559 Vitamin D deficiency, unspecified: Secondary | ICD-10-CM

## 2020-12-11 DIAGNOSIS — F4320 Adjustment disorder, unspecified: Secondary | ICD-10-CM | POA: Diagnosis not present

## 2020-12-21 DIAGNOSIS — F329 Major depressive disorder, single episode, unspecified: Secondary | ICD-10-CM | POA: Diagnosis not present

## 2020-12-21 DIAGNOSIS — I5020 Unspecified systolic (congestive) heart failure: Secondary | ICD-10-CM | POA: Diagnosis not present

## 2020-12-21 DIAGNOSIS — R4189 Other symptoms and signs involving cognitive functions and awareness: Secondary | ICD-10-CM | POA: Diagnosis not present

## 2020-12-21 DIAGNOSIS — F0390 Unspecified dementia without behavioral disturbance: Secondary | ICD-10-CM | POA: Diagnosis not present

## 2020-12-21 DIAGNOSIS — F5102 Adjustment insomnia: Secondary | ICD-10-CM | POA: Diagnosis not present

## 2020-12-26 DIAGNOSIS — R0989 Other specified symptoms and signs involving the circulatory and respiratory systems: Secondary | ICD-10-CM | POA: Diagnosis not present

## 2020-12-26 DIAGNOSIS — K219 Gastro-esophageal reflux disease without esophagitis: Secondary | ICD-10-CM | POA: Diagnosis not present

## 2020-12-26 DIAGNOSIS — R600 Localized edema: Secondary | ICD-10-CM | POA: Diagnosis not present

## 2020-12-26 DIAGNOSIS — H8110 Benign paroxysmal vertigo, unspecified ear: Secondary | ICD-10-CM | POA: Diagnosis not present

## 2020-12-26 DIAGNOSIS — R5381 Other malaise: Secondary | ICD-10-CM | POA: Diagnosis not present

## 2020-12-26 DIAGNOSIS — I5022 Chronic systolic (congestive) heart failure: Secondary | ICD-10-CM | POA: Diagnosis not present

## 2020-12-26 DIAGNOSIS — M1990 Unspecified osteoarthritis, unspecified site: Secondary | ICD-10-CM | POA: Diagnosis not present

## 2020-12-26 DIAGNOSIS — F418 Other specified anxiety disorders: Secondary | ICD-10-CM | POA: Diagnosis not present

## 2020-12-26 DIAGNOSIS — I4891 Unspecified atrial fibrillation: Secondary | ICD-10-CM | POA: Diagnosis not present

## 2020-12-26 DIAGNOSIS — K5900 Constipation, unspecified: Secondary | ICD-10-CM | POA: Diagnosis not present

## 2020-12-26 NOTE — Progress Notes (Deleted)
Cardiology Office Note   Date:  12/26/2020   ID:  Virginia Young, DOB 30-Oct-1940, MRN [id number]  PCP:  Bonnita Nasuti, MD  Cardiologist:   Minus Breeding, MD  No chief complaint on file.     History of Present Illness: Virginia Young is a 80 y.o. female who presents for follow up of atrial fibrillation and nonischemic cardiomyopathy.  She has a mildly reduced ejection fraction of 40% in 04/2016.  She has had orthostatic hypotension.  She had PFTs. This demonstrated a diffusion defect.   She has also been treated with Lasix.    Since I last saw her she was in the hospital with syncope.  This was thought to be orthostatic.  She had hyponatremia and was thought to have SIADH.    ***   she has had increased lower extremity swelling.  She has chronic dyspnea.  She moves with a walker because of balance problems.  It sounds like she is relatively sedentary.  She does not really seem to watch her salt.  She is not describing new PND or orthopnea.  She is not having any new palpitations, presyncope or syncope.  She has had no weight gain or edema.    Past Medical History:  Diagnosis Date   Allergy    Atrial fibrillation ([place])    coumadin   Cataract    Chest pain 03/2012   a. Lex MV 10/13:  EF 64%, dist ant and apical defect sugg of soft tissue atten, no ischemia   Chronic systolic heart failure (HCC)    Depression    GERD (gastroesophageal reflux disease)    HLD (hyperlipidemia)    Mild mitral regurgitation    MS (multiple sclerosis) (HCC)    NICM (nonischemic cardiomyopathy) ([place])    a. neg CLite in 2003;  b. EF 40-45% in past;   c.  Echo 12/11: EF 35-40%, mild MR, mild LAE, mild RAE, small pericardial effusion    Osteoarthritis    Osteoporosis    Stasis ulcer (HCC)    Vaginal prolapse without uterine prolapse    Varicose veins     Past Surgical History:  Procedure Laterality Date   ANTERIOR AND POSTERIOR VAGINAL REPAIR W/ SACROSPINOUS LIGAMENT SUSPENSION  2016    [facility]   BLADDER SURGERY     CARPAL TUNNEL RELEASE Right 08/22/2017   Procedure: CARPAL TUNNEL RELEASE;  Surgeon: [name], MD;  Location: [facility];  Service: Orthopedics;  Laterality: Right;   cataract surgery Bilateral    EYE SURGERY     INGUINAL HERNIA REPAIR     right   LIPOMA EXCISION     h/o removed from upper back x 2   REVERSE SHOULDER ARTHROPLASTY Right 08/22/2017   Procedure: REVERSE SHOULDER ARTHROPLASTY;  Surgeon: [name], MD;  Location: [facility];  Service: Orthopedics;  Laterality: Right;     Current Outpatient Medications  Medication Sig Dispense Refill   Cranberry-Vitamin C-Inulin (UTI-STAT) LIQD Take 30 mLs by mouth 2 (two) times daily.     diclofenac Sodium (VOLTAREN) 1 % GEL Apply 4 g topically 4 (four) times daily. 4 g 0   divalproex (DEPAKOTE ER) 250 MG 24 hr tablet Take 1 tablet (250 mg total) by mouth daily. 90 tablet 0   DULoxetine (CYMBALTA) 30 MG capsule Take 1 capsule (30 mg total) by mouth daily. 20 capsule 0   ELIQUIS 5 MG TABS tablet TAKE 1 TABLET BY MOUTH EVERY 12 HOURS 180 tablet 0  linaclotide (LINZESS) 72 MCG capsule Take 1 capsule (72 mcg total) by mouth daily before breakfast. 90 capsule 1   memantine (NAMENDA) 5 MG tablet Take 1 tablet (5 mg total) by mouth 2 (two) times daily. 30 tablet 0   metoprolol tartrate (LOPRESSOR) 25 MG tablet Take 0.5 tablets (12.5 mg total) by mouth 3 (three) times daily. 20 tablet    Multiple Vitamin (MULTIVITAMIN) tablet Take 1 tablet by mouth daily.     ondansetron (ZOFRAN) 4 MG tablet Take 1 tablet (4 mg total) by mouth every 8 (eight) hours as needed for nausea or vomiting. 30 tablet 1   OXYGEN Inhale into the lungs. 2 liters PRN - anytime when needed ( cardio rx'd)     pantoprazole (PROTONIX) 40 MG tablet Take 1 tablet (40 mg total) by mouth 2 (two) times daily.     QUEtiapine (SEROQUEL) 50 MG tablet Take 50 mg by mouth 2 (two) times daily.     traMADol (ULTRAM) 50 MG tablet Take 50 mg by mouth every 12 (twelve) hours  as needed.     No current facility-administered medications for this visit.    Allergies:   Bactrim [sulfamethoxazole-trimethoprim], Lipitor [atorvastatin calcium], and Lisinopril    ROS:  Please see the history of present illness.   Otherwise, review of systems are positive for ***.   Young other systems are reviewed and negative.    PHYSICAL EXAM: VS:  There were no vitals taken for this visit. , BMI There is no height or weight on file to calculate BMI. GENERAL:  Well appearing NECK:  No jugular venous distention, waveform within normal limits, carotid upstroke brisk and symmetric, no bruits, no thyromegaly LUNGS:  Clear to auscultation bilaterally CHEST:  Unremarkable HEART:  PMI not displaced or sustained,S1 and S2 within normal limits, no S3, no S4, no clicks, no rubs, *** murmurs ABD:  Flat, positive bowel sounds normal in frequency in pitch, no bruits, no rebound, no guarding, no midline pulsatile mass, no hepatomegaly, no splenomegaly EXT:  2 plus pulses throughout, no edema, no cyanosis no clubbing    ***GENERAL:  Well appearing NECK:  No jugular venous distention, waveform within normal limits, carotid upstroke brisk and symmetric, no bruits, no thyromegaly LUNGS:  Clear to auscultation bilaterally CHEST:  Unremarkable HEART:  PMI not displaced or sustained,S1 and S2 within normal limits, no S3,  no clicks, no rubs, no murmurs ABD:  Flat, positive bowel sounds normal in frequency in pitch, no bruits, no rebound, no guarding, no midline pulsatile mass, no hepatomegaly, no splenomegaly EXT:  2 plus pulses throughout, moderate left greater than right leg edema, no cyanosis no clubbing   EKG:  EKG is *** ordered today. Atrial fibrillation, rate ***, axis within normal limits, intervals within normal limits, low voltage in the limb in chest leads, poor anterior R wave progression  Recent Labs: 09/16/2020: Magnesium 2.1 09/19/2020: Hemoglobin 14.9; Platelets 217; TSH  3.391 09/23/2020: ALT 17; BUN 10; Creatinine, Ser 0.82; Potassium 4.2; Sodium 127    Lipid Panel    Component Value Date/Time   CHOL 152 09/16/2020 0450   CHOL 196 05/15/2015 0827   TRIG 78 09/16/2020 0450   HDL 40 (L) 09/16/2020 0450   HDL 73 05/15/2015 0827   CHOLHDL 3.8 09/16/2020 0450   VLDL 16 09/16/2020 0450   LDLCALC 96 09/16/2020 0450   LDLCALC 108 (H) 05/15/2015 0827      Wt Readings from Last 3 Encounters:  11/06/20 223 lb (101.2 kg)  09/16/20 216 lb 7.9 oz (98.2 kg)  09/12/20 220 lb (99.8 kg)     Other studies Reviewed: Additional studies/ records that were reviewed today include: *** Review of the above records demonstrates:  Please see elsewhere in the note.     ASSESSMENT AND PLAN:  NICM:    ***  She has an increased volume today.  I am going to give her an extra 3 days of 20 mg Lasix.  She has not tolerated med titration in the past.  I think she would do just as well with salt restriction and we talked at length about this.  We talked about keeping her feet elevated.  I will check a basic metabolic profile in 2 weeks.    Atrial fib:    [name] has a CHA2DS2 - VASc score of 3.  ***   She does not really notice this rhythm.  I will check a CBC as its been since July.  Orthostatic hypotension:  ***    Current medicines are reviewed at length with the patient today.  The patient does not have concerns regarding medicines.  The following changes have been made: As above  Labs/ tests ordered today include:    No orders of the defined types were placed in this encounter.    Disposition:   FU with 4 months.   Signed, Minus Breeding, MD  12/26/2020 9:06 PM    [facility]

## 2020-12-27 ENCOUNTER — Ambulatory Visit: Payer: Medicare Other | Admitting: Cardiology

## 2020-12-27 DIAGNOSIS — F4320 Adjustment disorder, unspecified: Secondary | ICD-10-CM | POA: Diagnosis not present

## 2020-12-29 ENCOUNTER — Ambulatory Visit: Payer: Medicare Other | Admitting: Family

## 2021-01-04 DIAGNOSIS — L03113 Cellulitis of right upper limb: Secondary | ICD-10-CM | POA: Diagnosis not present

## 2021-01-04 DIAGNOSIS — S41111A Laceration without foreign body of right upper arm, initial encounter: Secondary | ICD-10-CM | POA: Diagnosis not present

## 2021-01-04 DIAGNOSIS — R609 Edema, unspecified: Secondary | ICD-10-CM | POA: Diagnosis not present

## 2021-01-04 DIAGNOSIS — L03115 Cellulitis of right lower limb: Secondary | ICD-10-CM | POA: Diagnosis not present

## 2021-01-18 DIAGNOSIS — R011 Cardiac murmur, unspecified: Secondary | ICD-10-CM | POA: Diagnosis not present

## 2021-01-18 DIAGNOSIS — R4189 Other symptoms and signs involving cognitive functions and awareness: Secondary | ICD-10-CM | POA: Diagnosis not present

## 2021-01-18 DIAGNOSIS — F329 Major depressive disorder, single episode, unspecified: Secondary | ICD-10-CM | POA: Diagnosis not present

## 2021-01-18 DIAGNOSIS — F0390 Unspecified dementia without behavioral disturbance: Secondary | ICD-10-CM | POA: Diagnosis not present

## 2021-01-18 DIAGNOSIS — F5102 Adjustment insomnia: Secondary | ICD-10-CM | POA: Diagnosis not present

## 2021-01-19 DIAGNOSIS — F4320 Adjustment disorder, unspecified: Secondary | ICD-10-CM | POA: Diagnosis not present

## 2021-01-23 DIAGNOSIS — R5381 Other malaise: Secondary | ICD-10-CM | POA: Diagnosis not present

## 2021-01-23 DIAGNOSIS — I10 Essential (primary) hypertension: Secondary | ICD-10-CM | POA: Diagnosis not present

## 2021-01-23 DIAGNOSIS — K219 Gastro-esophageal reflux disease without esophagitis: Secondary | ICD-10-CM | POA: Diagnosis not present

## 2021-01-23 DIAGNOSIS — R600 Localized edema: Secondary | ICD-10-CM | POA: Diagnosis not present

## 2021-01-23 DIAGNOSIS — F0390 Unspecified dementia without behavioral disturbance: Secondary | ICD-10-CM | POA: Diagnosis not present

## 2021-01-23 DIAGNOSIS — I5022 Chronic systolic (congestive) heart failure: Secondary | ICD-10-CM | POA: Diagnosis not present

## 2021-01-23 DIAGNOSIS — I4891 Unspecified atrial fibrillation: Secondary | ICD-10-CM | POA: Diagnosis not present

## 2021-01-23 DIAGNOSIS — H8110 Benign paroxysmal vertigo, unspecified ear: Secondary | ICD-10-CM | POA: Diagnosis not present

## 2021-01-23 DIAGNOSIS — K5900 Constipation, unspecified: Secondary | ICD-10-CM | POA: Diagnosis not present

## 2021-01-31 ENCOUNTER — Telehealth: Payer: Self-pay | Admitting: Family Medicine

## 2021-01-31 NOTE — Telephone Encounter (Signed)
Amy- is this something you can provide? I was reviewing last note and was not sure based on that

## 2021-01-31 NOTE — Telephone Encounter (Signed)
Pt's daughter states that [facility](which is the facility where pt lives on the assisted living side) is asking for a letter from a provider(s) verifying that pt is in her right mind and is aware of her surroundings.  Please call daughter to discuss this further.

## 2021-01-31 NOTE — Telephone Encounter (Signed)
LVM returning daughter's call.

## 2021-02-01 DIAGNOSIS — L299 Pruritus, unspecified: Secondary | ICD-10-CM | POA: Diagnosis not present

## 2021-02-01 DIAGNOSIS — R203 Hyperesthesia: Secondary | ICD-10-CM | POA: Diagnosis not present

## 2021-02-01 DIAGNOSIS — L209 Atopic dermatitis, unspecified: Secondary | ICD-10-CM | POA: Diagnosis not present

## 2021-02-05 NOTE — Telephone Encounter (Signed)
Called and spoke w/ patient's daughter. She states facility was asking to have letter on file stating she is aware of self, surroundings nad within her right mind. Advised last visit showed MMSE 22/30. Does show some impairments. She states she has gone off some meds since last visit and has improved. She has next follow up 05/09/21. I offered to schedule sooner appointment to re-evaluate her. She is going to speak with facility first to see what is needed. If sooner appt needed, she will call back to r/s appt for sooner one. Nothing further needed at this time.

## 2021-02-07 DIAGNOSIS — M1990 Unspecified osteoarthritis, unspecified site: Secondary | ICD-10-CM | POA: Diagnosis not present

## 2021-02-07 DIAGNOSIS — I160 Hypertensive urgency: Secondary | ICD-10-CM | POA: Diagnosis not present

## 2021-02-07 DIAGNOSIS — I5022 Chronic systolic (congestive) heart failure: Secondary | ICD-10-CM | POA: Diagnosis not present

## 2021-02-07 DIAGNOSIS — E038 Other specified hypothyroidism: Secondary | ICD-10-CM | POA: Diagnosis not present

## 2021-02-07 DIAGNOSIS — I5021 Acute systolic (congestive) heart failure: Secondary | ICD-10-CM | POA: Diagnosis not present

## 2021-02-07 DIAGNOSIS — I4891 Unspecified atrial fibrillation: Secondary | ICD-10-CM | POA: Diagnosis not present

## 2021-02-07 DIAGNOSIS — E119 Type 2 diabetes mellitus without complications: Secondary | ICD-10-CM | POA: Diagnosis not present

## 2021-02-07 DIAGNOSIS — I10 Essential (primary) hypertension: Secondary | ICD-10-CM | POA: Diagnosis not present

## 2021-02-07 DIAGNOSIS — E559 Vitamin D deficiency, unspecified: Secondary | ICD-10-CM | POA: Diagnosis not present

## 2021-02-07 DIAGNOSIS — D518 Other vitamin B12 deficiency anemias: Secondary | ICD-10-CM | POA: Diagnosis not present

## 2021-02-07 DIAGNOSIS — F0390 Unspecified dementia without behavioral disturbance: Secondary | ICD-10-CM | POA: Diagnosis not present

## 2021-02-09 DIAGNOSIS — I5020 Unspecified systolic (congestive) heart failure: Secondary | ICD-10-CM | POA: Diagnosis not present

## 2021-02-14 DIAGNOSIS — F4320 Adjustment disorder, unspecified: Secondary | ICD-10-CM | POA: Diagnosis not present

## 2021-02-15 DIAGNOSIS — R4189 Other symptoms and signs involving cognitive functions and awareness: Secondary | ICD-10-CM | POA: Diagnosis not present

## 2021-02-15 DIAGNOSIS — F0390 Unspecified dementia without behavioral disturbance: Secondary | ICD-10-CM | POA: Diagnosis not present

## 2021-02-15 DIAGNOSIS — F329 Major depressive disorder, single episode, unspecified: Secondary | ICD-10-CM | POA: Diagnosis not present

## 2021-02-15 DIAGNOSIS — F5102 Adjustment insomnia: Secondary | ICD-10-CM | POA: Diagnosis not present

## 2021-02-19 DIAGNOSIS — I4891 Unspecified atrial fibrillation: Secondary | ICD-10-CM | POA: Diagnosis not present

## 2021-02-19 DIAGNOSIS — I5022 Chronic systolic (congestive) heart failure: Secondary | ICD-10-CM | POA: Diagnosis not present

## 2021-02-19 DIAGNOSIS — I739 Peripheral vascular disease, unspecified: Secondary | ICD-10-CM | POA: Diagnosis not present

## 2021-02-19 DIAGNOSIS — I10 Essential (primary) hypertension: Secondary | ICD-10-CM | POA: Diagnosis not present

## 2021-02-19 DIAGNOSIS — K219 Gastro-esophageal reflux disease without esophagitis: Secondary | ICD-10-CM | POA: Diagnosis not present

## 2021-02-19 DIAGNOSIS — G8929 Other chronic pain: Secondary | ICD-10-CM | POA: Diagnosis not present

## 2021-02-19 DIAGNOSIS — R600 Localized edema: Secondary | ICD-10-CM | POA: Diagnosis not present

## 2021-02-23 DIAGNOSIS — I5020 Unspecified systolic (congestive) heart failure: Secondary | ICD-10-CM | POA: Diagnosis not present

## 2021-02-28 DIAGNOSIS — F4320 Adjustment disorder, unspecified: Secondary | ICD-10-CM | POA: Diagnosis not present

## 2021-03-12 DIAGNOSIS — I5022 Chronic systolic (congestive) heart failure: Secondary | ICD-10-CM | POA: Diagnosis not present

## 2021-03-12 DIAGNOSIS — I160 Hypertensive urgency: Secondary | ICD-10-CM | POA: Diagnosis not present

## 2021-03-12 DIAGNOSIS — E119 Type 2 diabetes mellitus without complications: Secondary | ICD-10-CM | POA: Diagnosis not present

## 2021-03-12 DIAGNOSIS — M1990 Unspecified osteoarthritis, unspecified site: Secondary | ICD-10-CM | POA: Diagnosis not present

## 2021-03-12 DIAGNOSIS — I10 Essential (primary) hypertension: Secondary | ICD-10-CM | POA: Diagnosis not present

## 2021-03-12 DIAGNOSIS — E559 Vitamin D deficiency, unspecified: Secondary | ICD-10-CM | POA: Diagnosis not present

## 2021-03-12 DIAGNOSIS — I4891 Unspecified atrial fibrillation: Secondary | ICD-10-CM | POA: Diagnosis not present

## 2021-03-12 DIAGNOSIS — I5021 Acute systolic (congestive) heart failure: Secondary | ICD-10-CM | POA: Diagnosis not present

## 2021-03-12 DIAGNOSIS — E038 Other specified hypothyroidism: Secondary | ICD-10-CM | POA: Diagnosis not present

## 2021-03-12 DIAGNOSIS — D518 Other vitamin B12 deficiency anemias: Secondary | ICD-10-CM | POA: Diagnosis not present

## 2021-03-12 DIAGNOSIS — F0390 Unspecified dementia without behavioral disturbance: Secondary | ICD-10-CM | POA: Diagnosis not present

## 2021-03-15 DIAGNOSIS — R4189 Other symptoms and signs involving cognitive functions and awareness: Secondary | ICD-10-CM | POA: Diagnosis not present

## 2021-03-15 DIAGNOSIS — F5102 Adjustment insomnia: Secondary | ICD-10-CM | POA: Diagnosis not present

## 2021-03-15 DIAGNOSIS — F0390 Unspecified dementia without behavioral disturbance: Secondary | ICD-10-CM | POA: Diagnosis not present

## 2021-03-15 DIAGNOSIS — F329 Major depressive disorder, single episode, unspecified: Secondary | ICD-10-CM | POA: Diagnosis not present

## 2021-03-20 DIAGNOSIS — G8929 Other chronic pain: Secondary | ICD-10-CM | POA: Diagnosis not present

## 2021-03-20 DIAGNOSIS — F0390 Unspecified dementia without behavioral disturbance: Secondary | ICD-10-CM | POA: Diagnosis not present

## 2021-03-20 DIAGNOSIS — R5381 Other malaise: Secondary | ICD-10-CM | POA: Diagnosis not present

## 2021-03-20 DIAGNOSIS — K219 Gastro-esophageal reflux disease without esophagitis: Secondary | ICD-10-CM | POA: Diagnosis not present

## 2021-03-20 DIAGNOSIS — H8110 Benign paroxysmal vertigo, unspecified ear: Secondary | ICD-10-CM | POA: Diagnosis not present

## 2021-03-20 DIAGNOSIS — R600 Localized edema: Secondary | ICD-10-CM | POA: Diagnosis not present

## 2021-03-20 DIAGNOSIS — K5900 Constipation, unspecified: Secondary | ICD-10-CM | POA: Diagnosis not present

## 2021-03-20 DIAGNOSIS — I4891 Unspecified atrial fibrillation: Secondary | ICD-10-CM | POA: Diagnosis not present

## 2021-03-20 DIAGNOSIS — I5022 Chronic systolic (congestive) heart failure: Secondary | ICD-10-CM | POA: Diagnosis not present

## 2021-03-20 DIAGNOSIS — M1990 Unspecified osteoarthritis, unspecified site: Secondary | ICD-10-CM | POA: Diagnosis not present

## 2021-03-21 DIAGNOSIS — F4320 Adjustment disorder, unspecified: Secondary | ICD-10-CM | POA: Diagnosis not present

## 2021-03-22 DIAGNOSIS — E782 Mixed hyperlipidemia: Secondary | ICD-10-CM | POA: Diagnosis not present

## 2021-03-22 DIAGNOSIS — I4891 Unspecified atrial fibrillation: Secondary | ICD-10-CM | POA: Diagnosis not present

## 2021-03-22 DIAGNOSIS — I739 Peripheral vascular disease, unspecified: Secondary | ICD-10-CM | POA: Diagnosis not present

## 2021-03-22 DIAGNOSIS — E559 Vitamin D deficiency, unspecified: Secondary | ICD-10-CM | POA: Diagnosis not present

## 2021-03-27 DIAGNOSIS — D518 Other vitamin B12 deficiency anemias: Secondary | ICD-10-CM | POA: Diagnosis not present

## 2021-03-27 DIAGNOSIS — I10 Essential (primary) hypertension: Secondary | ICD-10-CM | POA: Diagnosis not present

## 2021-03-27 DIAGNOSIS — E7849 Other hyperlipidemia: Secondary | ICD-10-CM | POA: Diagnosis not present

## 2021-03-27 DIAGNOSIS — E119 Type 2 diabetes mellitus without complications: Secondary | ICD-10-CM | POA: Diagnosis not present

## 2021-03-27 DIAGNOSIS — Z79899 Other long term (current) drug therapy: Secondary | ICD-10-CM | POA: Diagnosis not present

## 2021-03-27 DIAGNOSIS — E559 Vitamin D deficiency, unspecified: Secondary | ICD-10-CM | POA: Diagnosis not present

## 2021-03-27 DIAGNOSIS — E038 Other specified hypothyroidism: Secondary | ICD-10-CM | POA: Diagnosis not present

## 2021-04-02 DIAGNOSIS — R600 Localized edema: Secondary | ICD-10-CM | POA: Diagnosis not present

## 2021-04-02 DIAGNOSIS — E1159 Type 2 diabetes mellitus with other circulatory complications: Secondary | ICD-10-CM | POA: Diagnosis not present

## 2021-04-02 DIAGNOSIS — L603 Nail dystrophy: Secondary | ICD-10-CM | POA: Diagnosis not present

## 2021-04-04 DIAGNOSIS — I5021 Acute systolic (congestive) heart failure: Secondary | ICD-10-CM | POA: Diagnosis not present

## 2021-04-04 DIAGNOSIS — E119 Type 2 diabetes mellitus without complications: Secondary | ICD-10-CM | POA: Diagnosis not present

## 2021-04-04 DIAGNOSIS — E559 Vitamin D deficiency, unspecified: Secondary | ICD-10-CM | POA: Diagnosis not present

## 2021-04-04 DIAGNOSIS — D518 Other vitamin B12 deficiency anemias: Secondary | ICD-10-CM | POA: Diagnosis not present

## 2021-04-04 DIAGNOSIS — I10 Essential (primary) hypertension: Secondary | ICD-10-CM | POA: Diagnosis not present

## 2021-04-04 DIAGNOSIS — E038 Other specified hypothyroidism: Secondary | ICD-10-CM | POA: Diagnosis not present

## 2021-04-04 DIAGNOSIS — I160 Hypertensive urgency: Secondary | ICD-10-CM | POA: Diagnosis not present

## 2021-04-04 DIAGNOSIS — M1990 Unspecified osteoarthritis, unspecified site: Secondary | ICD-10-CM | POA: Diagnosis not present

## 2021-04-04 DIAGNOSIS — E781 Pure hyperglyceridemia: Secondary | ICD-10-CM | POA: Diagnosis not present

## 2021-04-04 DIAGNOSIS — I5022 Chronic systolic (congestive) heart failure: Secondary | ICD-10-CM | POA: Diagnosis not present

## 2021-04-04 DIAGNOSIS — I4891 Unspecified atrial fibrillation: Secondary | ICD-10-CM | POA: Diagnosis not present

## 2021-04-04 DIAGNOSIS — F4320 Adjustment disorder, unspecified: Secondary | ICD-10-CM | POA: Diagnosis not present

## 2021-04-04 DIAGNOSIS — F0390 Unspecified dementia without behavioral disturbance: Secondary | ICD-10-CM | POA: Diagnosis not present

## 2021-04-10 NOTE — Progress Notes (Signed)
Cardiology Office Note   Date:  04/11/2021   ID:  Virginia Young, DOB 1940-07-28, MRN [id number]  PCP:  Bonnita Nasuti, MD  Cardiologist:   Minus Breeding, MD  Chief Complaint  Patient presents with   Dizziness       History of Present Illness: Virginia Young is a 80 y.o. female who presents for follow up of atrial fibrillation and nonischemic cardiomyopathy.  She has a mildly reduced ejection fraction of 40% in 04/2016.  She has had orthostatic hypotension.  She was last in the hospital in March with orthostatic hypotension.  I reviewed these records for this visit.    She has been treated with midodrine and her Coreg was changed to tid 12.5 instead of 25 mg bid.  She presents for follow up.    She lives at nursing home and she walks with a walker.  She says she gets to the dining hall.  She does get dizzy on standing.  She sometimes has to sit down.  However, she has not had any further syncope or presyncope.  Her history is made less reliable by her memory disorder.  She denies any chest pressure, neck or arm discomfort.  She does not notice her atrial fibrillation.  She has not had any new shortness of breath, PND or orthopnea.  She sleeps with her head a little bit elevated she reports has a little bit of dyspnea but this has been baseline.  I do see an echocardiogram from March which suggested an EF of 55 to 60%.  Of note on reviewing her hospital records when she was there in May she was sent home with carvedilol and midodrine.  I do not see the midodrine on her list.  I currently see her on metoprolol.  Past Medical History:  Diagnosis Date   Allergy    Atrial fibrillation ([place])    coumadin   Cataract    Chest pain 03/2012   a. Lex MV 10/13:  EF 64%, dist ant and apical defect sugg of soft tissue atten, no ischemia   Chronic systolic heart failure (HCC)    Depression    GERD (gastroesophageal reflux disease)    HLD (hyperlipidemia)    Mild mitral regurgitation     MS (multiple sclerosis) (HCC)    NICM (nonischemic cardiomyopathy) ([place])    a. neg CLite in 2003;  b. EF 40-45% in past;   c.  Echo 12/11: EF 35-40%, mild MR, mild LAE, mild RAE, small pericardial effusion    Osteoarthritis    Osteoporosis    Stasis ulcer (HCC)    Vaginal prolapse without uterine prolapse    Varicose veins     Past Surgical History:  Procedure Laterality Date   ANTERIOR AND POSTERIOR VAGINAL REPAIR W/ SACROSPINOUS LIGAMENT SUSPENSION  2016   [facility]   BLADDER SURGERY     CARPAL TUNNEL RELEASE Right 08/22/2017   Procedure: CARPAL TUNNEL RELEASE;  Surgeon: [name], MD;  Location: [facility];  Service: Orthopedics;  Laterality: Right;   cataract surgery Bilateral    EYE SURGERY     INGUINAL HERNIA REPAIR     right   LIPOMA EXCISION     h/o removed from upper back x 2   REVERSE SHOULDER ARTHROPLASTY Right 08/22/2017   Procedure: REVERSE SHOULDER ARTHROPLASTY;  Surgeon: [name], MD;  Location: [facility];  Service: Orthopedics;  Laterality: Right;     Current Outpatient Medications  Medication Sig Dispense Refill  Cholecalciferol (D3-1000 PO) Take 1,000 Units by mouth daily.     Cranberry-Vitamin C-Inulin (UTI-STAT) LIQD Take 30 mLs by mouth 2 (two) times daily.     divalproex (DEPAKOTE ER) 250 MG 24 hr tablet Take 1 tablet (250 mg total) by mouth daily. 90 tablet 0   DULoxetine (CYMBALTA) 30 MG capsule Take 1 capsule (30 mg total) by mouth daily. 20 capsule 0   ELIQUIS 5 MG TABS tablet TAKE 1 TABLET BY MOUTH EVERY 12 HOURS 180 tablet 0   furosemide (LASIX) 20 MG tablet Take 20 mg by mouth daily.     hydrOXYzine (ATARAX/VISTARIL) 25 MG tablet Take 25 mg by mouth 3 (three) times daily as needed.     linaclotide (LINZESS) 72 MCG capsule Take 1 capsule (72 mcg total) by mouth daily before breakfast. 90 capsule 1   memantine (NAMENDA) 5 MG tablet Take 1 tablet (5 mg total) by mouth 2 (two) times daily. 30 tablet 0   metFORMIN (GLUCOPHAGE) 500 MG tablet Take 500 mg by  mouth daily.     metoprolol tartrate (LOPRESSOR) 25 MG tablet Take 0.5 tablets (12.5 mg total) by mouth 3 (three) times daily. 20 tablet    midodrine (PROAMATINE) 2.5 MG tablet Take 1 tablet (2.5 mg total) by mouth 3 (three) times daily with meals. 270 tablet 3   Multiple Vitamin (MULTIVITAMIN) tablet Take 1 tablet by mouth daily.     pantoprazole (PROTONIX) 40 MG tablet Take 1 tablet (40 mg total) by mouth 2 (two) times daily.     QUEtiapine (SEROQUEL) 50 MG tablet Take 50 mg by mouth 2 (two) times daily.     diclofenac Sodium (VOLTAREN) 1 % GEL Apply 4 g topically 4 (four) times daily. (Patient not taking: Reported on 04/11/2021) 4 g 0   ondansetron (ZOFRAN) 4 MG tablet Take 1 tablet (4 mg total) by mouth every 8 (eight) hours as needed for nausea or vomiting. (Patient not taking: Reported on 04/11/2021) 30 tablet 1   OXYGEN Inhale into the lungs. 2 liters PRN - anytime when needed ( cardio rx'd)     traMADol (ULTRAM) 50 MG tablet Take 50 mg by mouth every 12 (twelve) hours as needed. (Patient not taking: Reported on 04/11/2021)     No current facility-administered medications for this visit.    Allergies:   Bactrim [sulfamethoxazole-trimethoprim], Lipitor [atorvastatin calcium], and Lisinopril    ROS:  Please see the history of present illness.   Otherwise, review of systems are positive for none.   All other systems are reviewed and negative.    PHYSICAL EXAM: VS:  BP 120/80   Pulse 84  , BMI There is no height or weight on file to calculate BMI. GENERAL: Mildly frail appearing NECK:  No jugular venous distention, waveform within normal limits, carotid upstroke brisk and symmetric, no bruits, no thyromegaly LUNGS:  Clear to auscultation bilaterally CHEST:  Unremarkable HEART:  PMI not displaced or sustained,S1 and S2 within normal limits, no S3, no clicks, no rubs, no murmurs, irregular  ABD:  Flat, positive bowel sounds normal in frequency in pitch, no bruits, no rebound, no  guarding, no midline pulsatile mass, no hepatomegaly, no splenomegaly EXT:  2 plus pulses throughout, trace  edema, no cyanosis no clubbing   EKG:  EKG is  ordered today. Atrial fibrillation, rate 84, axis within normal limits, intervals within normal limits, low voltage in the limb in chest leads, poor anterior R wave progression  Recent Labs: 09/16/2020: Magnesium 2.1 09/19/2020: Hemoglobin  14.9; Platelets 217; TSH 3.391 09/23/2020: ALT 17; BUN 10; Creatinine, Ser 0.82; Potassium 4.2; Sodium 127    Lipid Panel    Component Value Date/Time   CHOL 152 09/16/2020 0450   CHOL 196 05/15/2015 0827   TRIG 78 09/16/2020 0450   HDL 40 (L) 09/16/2020 0450   HDL 73 05/15/2015 0827   CHOLHDL 3.8 09/16/2020 0450   VLDL 16 09/16/2020 0450   LDLCALC 96 09/16/2020 0450   LDLCALC 108 (H) 05/15/2015 0827      Wt Readings from Last 3 Encounters:  11/06/20 223 lb (101.2 kg)  09/16/20 216 lb 7.9 oz (98.2 kg)  09/12/20 220 lb (99.8 kg)     Other studies Reviewed: Additional studies/ records that were reviewed today include: Hospital records Review of the above records demonstrates:  Please see elsewhere in the note.     ASSESSMENT AND PLAN:  NICM:    Her EF is normal on the echo as above.  She tolerates a very low-dose of medication as listed and I will make that change.   Atrial fib:    [name] has a CHA2DS2 - VASc score of 3.  She tolerates anticoagulation and seems to have good rate control.  No change in therapy.   Orthostatic hypotension: I tried to look to this to see why she might not still be on midodrine given this documentation and the complaint.  She probably does have some supine hypertension.  However, I am going to suggest and write for midodrine 2.5 mg 3 times daily and send this note to her provider at her nursing home.   Current medicines are reviewed at length with the patient today.  The patient does not have concerns regarding medicines.  The following  changes have been made: As above  Labs/ tests ordered today include:   None  Orders Placed This Encounter  Procedures   EKG 12-Lead      Disposition:   FU with 6 months.   Signed, Minus Breeding, MD  04/11/2021 2:28 PM    [facility]

## 2021-04-11 ENCOUNTER — Ambulatory Visit (INDEPENDENT_AMBULATORY_CARE_PROVIDER_SITE_OTHER): Payer: Medicare Other | Admitting: Cardiology

## 2021-04-11 ENCOUNTER — Encounter: Payer: Self-pay | Admitting: Cardiology

## 2021-04-11 ENCOUNTER — Other Ambulatory Visit: Payer: Self-pay

## 2021-04-11 VITALS — BP 120/80 | HR 84

## 2021-04-11 DIAGNOSIS — I428 Other cardiomyopathies: Secondary | ICD-10-CM | POA: Diagnosis not present

## 2021-04-11 DIAGNOSIS — I482 Chronic atrial fibrillation, unspecified: Secondary | ICD-10-CM

## 2021-04-11 MED ORDER — MIDODRINE HCL 2.5 MG PO TABS: 2.5000 mg | ORAL_TABLET | Freq: Three times a day (TID) | ORAL | 3 refills | Status: DC

## 2021-04-11 NOTE — Patient Instructions (Signed)
Medication Instructions:  Please start Midodrine 2.5 mg (1) tablet by mouth three times a day. (AM, 12N and 2 PM) Continue all other medications as listed.  *If you need a refill on your cardiac medications before your next appointment, please call your pharmacy*  Follow-Up: At [facility], you and your health needs are our priority.  As part of our continuing mission to provide you with exceptional heart care, we have created designated Provider Care Teams.  These Care Teams include your primary Cardiologist (physician) and Advanced Practice Providers (APPs -  Physician Assistants and Nurse Practitioners) who all work together to provide you with the care you need, when you need it.  We recommend signing up for the patient portal called "MyChart".  Sign up information is provided on this After Visit Summary.  MyChart is used to connect with patients for Virtual Visits (Telemedicine).  Patients are able to view lab/test results, encounter notes, upcoming appointments, etc.  Non-urgent messages can be sent to your provider as well.   To learn more about what you can do with MyChart, go to [email or web address].    Your next appointment:   6 month(s)  The format for your next appointment:   In Person  Provider:   Minus Breeding, MD   Thank you for choosing [facility]!!

## 2021-04-17 DIAGNOSIS — K219 Gastro-esophageal reflux disease without esophagitis: Secondary | ICD-10-CM | POA: Diagnosis not present

## 2021-04-17 DIAGNOSIS — E781 Pure hyperglyceridemia: Secondary | ICD-10-CM | POA: Diagnosis not present

## 2021-04-17 DIAGNOSIS — R600 Localized edema: Secondary | ICD-10-CM | POA: Diagnosis not present

## 2021-04-17 DIAGNOSIS — I4891 Unspecified atrial fibrillation: Secondary | ICD-10-CM | POA: Diagnosis not present

## 2021-04-17 DIAGNOSIS — I5022 Chronic systolic (congestive) heart failure: Secondary | ICD-10-CM | POA: Diagnosis not present

## 2021-04-17 DIAGNOSIS — I10 Essential (primary) hypertension: Secondary | ICD-10-CM | POA: Diagnosis not present

## 2021-04-17 DIAGNOSIS — I739 Peripheral vascular disease, unspecified: Secondary | ICD-10-CM | POA: Diagnosis not present

## 2021-04-17 DIAGNOSIS — E559 Vitamin D deficiency, unspecified: Secondary | ICD-10-CM | POA: Diagnosis not present

## 2021-04-17 DIAGNOSIS — R0602 Shortness of breath: Secondary | ICD-10-CM | POA: Diagnosis not present

## 2021-04-18 DIAGNOSIS — I5020 Unspecified systolic (congestive) heart failure: Secondary | ICD-10-CM | POA: Diagnosis not present

## 2021-04-19 DIAGNOSIS — F0390 Unspecified dementia without behavioral disturbance: Secondary | ICD-10-CM | POA: Diagnosis not present

## 2021-04-19 DIAGNOSIS — F329 Major depressive disorder, single episode, unspecified: Secondary | ICD-10-CM | POA: Diagnosis not present

## 2021-04-19 DIAGNOSIS — R4189 Other symptoms and signs involving cognitive functions and awareness: Secondary | ICD-10-CM | POA: Diagnosis not present

## 2021-04-19 DIAGNOSIS — F5102 Adjustment insomnia: Secondary | ICD-10-CM | POA: Diagnosis not present

## 2021-04-21 DIAGNOSIS — I4891 Unspecified atrial fibrillation: Secondary | ICD-10-CM | POA: Diagnosis not present

## 2021-04-21 DIAGNOSIS — I739 Peripheral vascular disease, unspecified: Secondary | ICD-10-CM | POA: Diagnosis not present

## 2021-04-21 DIAGNOSIS — F4320 Adjustment disorder, unspecified: Secondary | ICD-10-CM | POA: Diagnosis not present

## 2021-04-21 DIAGNOSIS — E559 Vitamin D deficiency, unspecified: Secondary | ICD-10-CM | POA: Diagnosis not present

## 2021-04-21 DIAGNOSIS — E782 Mixed hyperlipidemia: Secondary | ICD-10-CM | POA: Diagnosis not present

## 2021-04-26 DIAGNOSIS — F4320 Adjustment disorder, unspecified: Secondary | ICD-10-CM | POA: Diagnosis not present

## 2021-05-04 DIAGNOSIS — I5021 Acute systolic (congestive) heart failure: Secondary | ICD-10-CM | POA: Diagnosis not present

## 2021-05-04 DIAGNOSIS — I160 Hypertensive urgency: Secondary | ICD-10-CM | POA: Diagnosis not present

## 2021-05-04 DIAGNOSIS — D518 Other vitamin B12 deficiency anemias: Secondary | ICD-10-CM | POA: Diagnosis not present

## 2021-05-04 DIAGNOSIS — E559 Vitamin D deficiency, unspecified: Secondary | ICD-10-CM | POA: Diagnosis not present

## 2021-05-04 DIAGNOSIS — E119 Type 2 diabetes mellitus without complications: Secondary | ICD-10-CM | POA: Diagnosis not present

## 2021-05-04 DIAGNOSIS — F0390 Unspecified dementia without behavioral disturbance: Secondary | ICD-10-CM | POA: Diagnosis not present

## 2021-05-04 DIAGNOSIS — E038 Other specified hypothyroidism: Secondary | ICD-10-CM | POA: Diagnosis not present

## 2021-05-04 DIAGNOSIS — M1990 Unspecified osteoarthritis, unspecified site: Secondary | ICD-10-CM | POA: Diagnosis not present

## 2021-05-04 DIAGNOSIS — I4891 Unspecified atrial fibrillation: Secondary | ICD-10-CM | POA: Diagnosis not present

## 2021-05-04 DIAGNOSIS — I10 Essential (primary) hypertension: Secondary | ICD-10-CM | POA: Diagnosis not present

## 2021-05-04 DIAGNOSIS — E781 Pure hyperglyceridemia: Secondary | ICD-10-CM | POA: Diagnosis not present

## 2021-05-04 DIAGNOSIS — I5022 Chronic systolic (congestive) heart failure: Secondary | ICD-10-CM | POA: Diagnosis not present

## 2021-05-06 DIAGNOSIS — E782 Mixed hyperlipidemia: Secondary | ICD-10-CM | POA: Diagnosis not present

## 2021-05-06 DIAGNOSIS — E559 Vitamin D deficiency, unspecified: Secondary | ICD-10-CM | POA: Diagnosis not present

## 2021-05-06 DIAGNOSIS — I739 Peripheral vascular disease, unspecified: Secondary | ICD-10-CM | POA: Diagnosis not present

## 2021-05-06 DIAGNOSIS — I4891 Unspecified atrial fibrillation: Secondary | ICD-10-CM | POA: Diagnosis not present
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
## 2021-05-08 NOTE — Patient Instructions (Incomplete)

## 2021-05-09 ENCOUNTER — Ambulatory Visit: Payer: Medicare Other | Admitting: Family Medicine

## 2021-05-10 DIAGNOSIS — F4320 Adjustment disorder, unspecified: Secondary | ICD-10-CM | POA: Diagnosis not present

## 2021-05-22 DIAGNOSIS — I10 Essential (primary) hypertension: Secondary | ICD-10-CM | POA: Diagnosis not present

## 2021-05-23 DIAGNOSIS — F0390 Unspecified dementia without behavioral disturbance: Secondary | ICD-10-CM | POA: Diagnosis not present

## 2021-05-23 DIAGNOSIS — F5102 Adjustment insomnia: Secondary | ICD-10-CM | POA: Diagnosis not present

## 2021-05-23 DIAGNOSIS — R4189 Other symptoms and signs involving cognitive functions and awareness: Secondary | ICD-10-CM | POA: Diagnosis not present

## 2021-05-23 DIAGNOSIS — F329 Major depressive disorder, single episode, unspecified: Secondary | ICD-10-CM | POA: Diagnosis not present

## 2021-05-24 DIAGNOSIS — F4320 Adjustment disorder, unspecified: Secondary | ICD-10-CM | POA: Diagnosis not present

## 2021-05-29 DIAGNOSIS — R0609 Other forms of dyspnea: Secondary | ICD-10-CM | POA: Diagnosis not present

## 2021-05-29 DIAGNOSIS — I10 Essential (primary) hypertension: Secondary | ICD-10-CM | POA: Diagnosis not present

## 2021-05-29 DIAGNOSIS — R600 Localized edema: Secondary | ICD-10-CM | POA: Diagnosis not present

## 2021-05-29 DIAGNOSIS — F0390 Unspecified dementia without behavioral disturbance: Secondary | ICD-10-CM | POA: Diagnosis not present

## 2021-05-29 DIAGNOSIS — K219 Gastro-esophageal reflux disease without esophagitis: Secondary | ICD-10-CM | POA: Diagnosis not present

## 2021-05-29 DIAGNOSIS — I4891 Unspecified atrial fibrillation: Secondary | ICD-10-CM | POA: Diagnosis not present

## 2021-05-29 DIAGNOSIS — R059 Cough, unspecified: Secondary | ICD-10-CM | POA: Diagnosis not present

## 2021-05-29 DIAGNOSIS — I5022 Chronic systolic (congestive) heart failure: Secondary | ICD-10-CM | POA: Diagnosis not present

## 2021-05-29 DIAGNOSIS — R0602 Shortness of breath: Secondary | ICD-10-CM | POA: Diagnosis not present

## 2021-05-30 DIAGNOSIS — R059 Cough, unspecified: Secondary | ICD-10-CM | POA: Diagnosis not present

## 2021-05-30 DIAGNOSIS — J189 Pneumonia, unspecified organism: Secondary | ICD-10-CM | POA: Diagnosis not present

## 2021-05-30 DIAGNOSIS — R0609 Other forms of dyspnea: Secondary | ICD-10-CM | POA: Diagnosis not present

## 2021-05-30 DIAGNOSIS — Z79899 Other long term (current) drug therapy: Secondary | ICD-10-CM | POA: Diagnosis not present

## 2021-05-30 DIAGNOSIS — I509 Heart failure, unspecified: Secondary | ICD-10-CM | POA: Diagnosis not present

## 2021-06-01 DIAGNOSIS — E781 Pure hyperglyceridemia: Secondary | ICD-10-CM | POA: Diagnosis not present

## 2021-06-01 DIAGNOSIS — D518 Other vitamin B12 deficiency anemias: Secondary | ICD-10-CM | POA: Diagnosis not present

## 2021-06-01 DIAGNOSIS — I10 Essential (primary) hypertension: Secondary | ICD-10-CM | POA: Diagnosis not present

## 2021-06-01 DIAGNOSIS — I4891 Unspecified atrial fibrillation: Secondary | ICD-10-CM | POA: Diagnosis not present

## 2021-06-01 DIAGNOSIS — I5022 Chronic systolic (congestive) heart failure: Secondary | ICD-10-CM | POA: Diagnosis not present

## 2021-06-01 DIAGNOSIS — M1990 Unspecified osteoarthritis, unspecified site: Secondary | ICD-10-CM | POA: Diagnosis not present

## 2021-06-01 DIAGNOSIS — F0390 Unspecified dementia without behavioral disturbance: Secondary | ICD-10-CM | POA: Diagnosis not present

## 2021-06-01 DIAGNOSIS — E119 Type 2 diabetes mellitus without complications: Secondary | ICD-10-CM | POA: Diagnosis not present

## 2021-06-01 DIAGNOSIS — I160 Hypertensive urgency: Secondary | ICD-10-CM | POA: Diagnosis not present

## 2021-06-01 DIAGNOSIS — I5021 Acute systolic (congestive) heart failure: Secondary | ICD-10-CM | POA: Diagnosis not present

## 2021-06-01 DIAGNOSIS — E559 Vitamin D deficiency, unspecified: Secondary | ICD-10-CM | POA: Diagnosis not present

## 2021-06-01 DIAGNOSIS — E038 Other specified hypothyroidism: Secondary | ICD-10-CM | POA: Diagnosis not present

## 2021-06-03 DIAGNOSIS — I739 Peripheral vascular disease, unspecified: Secondary | ICD-10-CM | POA: Diagnosis not present

## 2021-06-03 DIAGNOSIS — I4891 Unspecified atrial fibrillation: Secondary | ICD-10-CM | POA: Diagnosis not present

## 2021-06-03 DIAGNOSIS — E782 Mixed hyperlipidemia: Secondary | ICD-10-CM | POA: Diagnosis not present

## 2021-06-03 DIAGNOSIS — E559 Vitamin D deficiency, unspecified: Secondary | ICD-10-CM | POA: Diagnosis not present

## 2021-06-08 DIAGNOSIS — Z79899 Other long term (current) drug therapy: Secondary | ICD-10-CM | POA: Diagnosis not present

## 2021-06-08 DIAGNOSIS — E7849 Other hyperlipidemia: Secondary | ICD-10-CM | POA: Diagnosis not present

## 2021-06-08 DIAGNOSIS — E559 Vitamin D deficiency, unspecified: Secondary | ICD-10-CM | POA: Diagnosis not present

## 2021-06-08 DIAGNOSIS — D518 Other vitamin B12 deficiency anemias: Secondary | ICD-10-CM | POA: Diagnosis not present

## 2021-06-08 DIAGNOSIS — E119 Type 2 diabetes mellitus without complications: Secondary | ICD-10-CM | POA: Diagnosis not present

## 2021-06-08 DIAGNOSIS — E038 Other specified hypothyroidism: Secondary | ICD-10-CM | POA: Diagnosis not present

## 2021-06-14 DIAGNOSIS — F329 Major depressive disorder, single episode, unspecified: Secondary | ICD-10-CM | POA: Diagnosis not present

## 2021-06-14 DIAGNOSIS — F5102 Adjustment insomnia: Secondary | ICD-10-CM | POA: Diagnosis not present

## 2021-06-14 DIAGNOSIS — R4189 Other symptoms and signs involving cognitive functions and awareness: Secondary | ICD-10-CM | POA: Diagnosis not present

## 2021-06-14 DIAGNOSIS — F0390 Unspecified dementia without behavioral disturbance: Secondary | ICD-10-CM | POA: Diagnosis not present

## 2021-06-18 DIAGNOSIS — Z23 Encounter for immunization: Secondary | ICD-10-CM | POA: Diagnosis not present

## 2021-06-19 DIAGNOSIS — F4320 Adjustment disorder, unspecified: Secondary | ICD-10-CM | POA: Diagnosis not present

## 2021-06-19 DIAGNOSIS — I10 Essential (primary) hypertension: Secondary | ICD-10-CM | POA: Diagnosis not present

## 2021-06-21 DIAGNOSIS — I951 Orthostatic hypotension: Secondary | ICD-10-CM | POA: Diagnosis not present

## 2021-06-21 DIAGNOSIS — H8110 Benign paroxysmal vertigo, unspecified ear: Secondary | ICD-10-CM | POA: Diagnosis not present

## 2021-06-21 DIAGNOSIS — R296 Repeated falls: Secondary | ICD-10-CM | POA: Diagnosis not present

## 2021-06-21 DIAGNOSIS — K219 Gastro-esophageal reflux disease without esophagitis: Secondary | ICD-10-CM | POA: Diagnosis not present

## 2021-06-21 DIAGNOSIS — R112 Nausea with vomiting, unspecified: Secondary | ICD-10-CM | POA: Diagnosis not present

## 2021-06-22 ENCOUNTER — Emergency Department (HOSPITAL_COMMUNITY): Payer: Medicare Other

## 2021-06-22 ENCOUNTER — Other Ambulatory Visit: Payer: Self-pay

## 2021-06-22 ENCOUNTER — Emergency Department (HOSPITAL_COMMUNITY)
Admission: EM | Admit: 2021-06-22 | Discharge: 2021-06-23 | Disposition: A | Discharge status: Skilled Nursing Facility | Payer: Medicare Other | Attending: Emergency Medicine | Admitting: Emergency Medicine

## 2021-06-22 ENCOUNTER — Encounter (HOSPITAL_COMMUNITY): Payer: Self-pay | Admitting: *Deleted

## 2021-06-22 DIAGNOSIS — R55 Syncope and collapse: Secondary | ICD-10-CM | POA: Insufficient documentation

## 2021-06-22 DIAGNOSIS — M25461 Effusion, right knee: Secondary | ICD-10-CM | POA: Diagnosis not present

## 2021-06-22 DIAGNOSIS — J181 Lobar pneumonia, unspecified organism: Secondary | ICD-10-CM | POA: Insufficient documentation

## 2021-06-22 DIAGNOSIS — W19XXXA Unspecified fall, initial encounter: Secondary | ICD-10-CM | POA: Insufficient documentation

## 2021-06-22 DIAGNOSIS — R0602 Shortness of breath: Secondary | ICD-10-CM

## 2021-06-22 DIAGNOSIS — M8588 Other specified disorders of bone density and structure, other site: Secondary | ICD-10-CM | POA: Diagnosis not present

## 2021-06-22 DIAGNOSIS — M2578 Osteophyte, vertebrae: Secondary | ICD-10-CM | POA: Diagnosis not present

## 2021-06-22 DIAGNOSIS — Z7901 Long term (current) use of anticoagulants: Secondary | ICD-10-CM | POA: Insufficient documentation

## 2021-06-22 DIAGNOSIS — Z743 Need for continuous supervision: Secondary | ICD-10-CM | POA: Diagnosis not present

## 2021-06-22 DIAGNOSIS — R0902 Hypoxemia: Secondary | ICD-10-CM | POA: Diagnosis not present

## 2021-06-22 DIAGNOSIS — R1031 Right lower quadrant pain: Secondary | ICD-10-CM | POA: Diagnosis not present

## 2021-06-22 DIAGNOSIS — R1084 Generalized abdominal pain: Secondary | ICD-10-CM | POA: Diagnosis not present

## 2021-06-22 DIAGNOSIS — I10 Essential (primary) hypertension: Secondary | ICD-10-CM | POA: Diagnosis not present

## 2021-06-22 DIAGNOSIS — J189 Pneumonia, unspecified organism: Secondary | ICD-10-CM

## 2021-06-22 DIAGNOSIS — R1030 Lower abdominal pain, unspecified: Secondary | ICD-10-CM | POA: Diagnosis not present

## 2021-06-22 DIAGNOSIS — M1711 Unilateral primary osteoarthritis, right knee: Secondary | ICD-10-CM | POA: Diagnosis not present

## 2021-06-22 LAB — CBC WITH DIFFERENTIAL/PLATELET
Abs Immature Granulocytes: 0.03 10*3/uL (ref 0.00–0.07)
Basophils Absolute: 0.1 10*3/uL (ref 0.0–0.1)
Basophils Relative: 1 %
Eosinophils Absolute: 0.1 10*3/uL (ref 0.0–0.5)
Eosinophils Relative: 1 %
HCT: 43.3 % (ref 36.0–46.0)
Hemoglobin: 14.6 g/dL (ref 12.0–15.0)
Immature Granulocytes: 0 %
Lymphocytes Relative: 19 %
Lymphs Abs: 2.3 10*3/uL (ref 0.7–4.0)
MCH: 30.4 pg (ref 26.0–34.0)
MCHC: 33.7 g/dL (ref 30.0–36.0)
MCV: 90.2 fL (ref 80.0–100.0)
Monocytes Absolute: 1 10*3/uL (ref 0.1–1.0)
Monocytes Relative: 9 %
Neutro Abs: 8.3 10*3/uL — ABNORMAL HIGH (ref 1.7–7.7)
Neutrophils Relative %: 70 %
Platelets: 174 10*3/uL (ref 150–400)
RBC: 4.8 MIL/uL (ref 3.87–5.11)
RDW: 14.1 % (ref 11.5–15.5)
WBC: 11.8 10*3/uL — ABNORMAL HIGH (ref 4.0–10.5)
nRBC: 0 % (ref 0.0–0.2)

## 2021-06-22 LAB — BASIC METABOLIC PANEL
Anion gap: 7 (ref 5–15)
BUN: 14 mg/dL (ref 8–23)
CO2: 27 mmol/L (ref 22–32)
Calcium: 8.6 mg/dL — ABNORMAL LOW (ref 8.9–10.3)
Chloride: 101 mmol/L (ref 98–111)
Creatinine, Ser: 0.87 mg/dL (ref 0.44–1.00)
GFR, Estimated: >60 mL/min (ref >60)
Glucose, Bld: 139 mg/dL — ABNORMAL HIGH (ref 70–99)
Potassium: 3.6 mmol/L (ref 3.5–5.1)
Sodium: 135 mmol/L (ref 135–145)

## 2021-06-22 MED ORDER — HYDROCODONE-ACETAMINOPHEN 5-325 MG PO TABS
1.0000 | ORAL_TABLET | Freq: Once | ORAL | Status: AC
  Administered 2021-06-22: 21:00:00 1 via ORAL
  Filled 2021-06-22: qty 1

## 2021-06-22 MED ORDER — OXYCODONE-ACETAMINOPHEN 5-325 MG PO TABS: 1.0000 | ORAL_TABLET | Freq: Three times a day (TID) | ORAL | 0 refills | Status: AC | PRN

## 2021-06-22 MED ORDER — ONDANSETRON HCL 4 MG PO TABS: 4.0000 mg | ORAL_TABLET | Freq: Four times a day (QID) | ORAL | 0 refills | Status: DC

## 2021-06-22 MED ORDER — IOHEXOL 300 MG/ML  SOLN
100.0000 mL | Freq: Once | INTRAMUSCULAR | Status: AC | PRN
  Administered 2021-06-22: 12:00:00 100 mL via INTRAVENOUS

## 2021-06-22 MED ORDER — OXYCODONE-ACETAMINOPHEN 5-325 MG PO TABS
1.0000 | ORAL_TABLET | Freq: Once | ORAL | Status: AC
  Administered 2021-06-22: 14:00:00 1 via ORAL
  Filled 2021-06-22: qty 1

## 2021-06-22 MED ORDER — ONDANSETRON 4 MG PO TBDP
4.0000 mg | ORAL_TABLET | Freq: Once | ORAL | Status: AC
  Administered 2021-06-22: 14:00:00 4 mg via ORAL
  Filled 2021-06-22: qty 1

## 2021-06-22 MED ORDER — MOXIFLOXACIN HCL 400 MG PO TABS: 400.0000 mg | ORAL_TABLET | Freq: Every day | ORAL | 0 refills | Status: AC

## 2021-06-22 NOTE — ED Notes (Signed)
Daughter Pam spoke with this nurse regarding her mother being in the ER still and not taken back to the nursing home.  This nurse explained to [place] that her mother is here until EMS can transport her back to the facility.  Pt has been given a tray and continues to wait on ride back to nursing facility.

## 2021-06-22 NOTE — ED Notes (Signed)
Pt provided meal tray

## 2021-06-22 NOTE — ED Notes (Signed)
Patient transported to CT 

## 2021-06-22 NOTE — ED Notes (Signed)
Pt given pain meds and zofran.

## 2021-06-22 NOTE — Discharge Instructions (Signed)
Imaging reveals that she has a left lower lobe pneumonia, of starting on antibiotics please take as prescribed.  I have also given her Zofran please as needed for nausea.  Likely patient's abdominal pain is a muscular strain of given her pain medications please take as prescribed.  I have given you a short course of narcotics please take as prescribed.  This medication can make you drowsy do not consume alcohol or operate heavy machinery when taking this medication.  This medication has Tylenol in it so be aware that you do not overdose Tylenol.  She can have up to 4g daily.  Please follow-up with your PCP in 1 week's time for repeat chest x-ray.  Come back to the emergency department if you develop chest pain, shortness of breath, severe abdominal pain, uncontrolled nausea, vomiting, diarrhea.

## 2021-06-22 NOTE — ED Provider Notes (Signed)
[facility] Provider Note   CSN: [id number] Arrival date & time: 06/22/21  1021     History Chief Complaint  Patient presents with   Abdominal Pain    Virginia Young is a 80 y.o. female.  HPI  Patient with medical history including A. fib currently on Eliquis, hyperlipidemia, cardiomyopathy, presents to the emergency department with chief complaint of right lower quadrant pain.  She is coming from the nursing facility.  She states that yesterday after she ate lunch she stood up started to feel as if she is going to pass out she grabbed a hold of something and then swung and hit her side on the table.  She denies ever losing conscious, denies hitting her head, she is currently on anticoagulant.  She states that she felt fine after that but this morning she started to have severe right lower quadrant pain, pain is consistent, is worsened with movement, does not radiate, no associated nausea or vomiting, constipation, diarrhea, denie melena or hematochezia.  She does endorse that she had 1 episode of vomiting yesterday but has not vomited since.  Patient states prior to blacking out she did not have any chest pain, shortness of breath, denies any headaches, change in vision, paresthesias or weakness upper lower extremities.  Patient states that she has a history of autonomic hypotension and this is normal for her.  Daughter is at bedside able to validate the story.  She states that she is acting her normal self has no other complaints.  Past Medical History:  Diagnosis Date   Allergy    Atrial fibrillation ([place])    coumadin   Cataract    Chest pain 03/2012   a. Lex MV 10/13:  EF 64%, dist ant and apical defect sugg of soft tissue atten, no ischemia   Chronic systolic heart failure (HCC)    Depression    GERD (gastroesophageal reflux disease)    HLD (hyperlipidemia)    Mild mitral regurgitation    MS (multiple sclerosis) (HCC)    NICM (nonischemic  cardiomyopathy) ([place])    a. neg CLite in 2003;  b. EF 40-45% in past;   c.  Echo 12/11: EF 35-40%, mild MR, mild LAE, mild RAE, small pericardial effusion    Osteoarthritis    Osteoporosis    Stasis ulcer ([place])    Vaginal prolapse without uterine prolapse    Varicose veins     Patient Active Problem List   Diagnosis Date Noted   Syncope 09/16/2020   Hypokalemia 09/16/2020   Hypoalbuminemia 09/16/2020   Abrasion of right arm 09/16/2020   Right sided weakness 09/16/2020   Accidental fall 09/16/2020   Syncope and collapse 09/16/2020   Acute CVA (cerebrovascular accident) ([place]) 09/15/2020   Pyometra due to chronic inflammatory disease of uterus 02/18/2020   Postmenopausal 08/11/2019   Vitamin D deficiency 02/17/2019   Constipation 02/17/2019   Witnessed seizure-like activity ([place]) 09/32/3557   Chronic systolic HF (heart failure) ([place]) 10/08/2018   Educated about COVID-19 virus infection 10/08/2018   Dementia ([place]) 02/10/2018   S/P shoulder replacement, right 08/22/2017   Essential hypertension 02/19/2016   Voiding dysfunction 10/07/2014   SUI (stress urinary incontinence, female) 10/07/2014   Osteoporosis with fracture 04/06/2014   DOE (dyspnea on exertion) 32/20/2542   Helicobacter positive gastritis 06/21/2013   Orthostatic hypotension 06/10/2013   Recurrent UTI 05/06/2013   Neck mass 02/19/2013   Right groin mass 11/08/2011   Low back pain 11/08/2011   Lipoma of  neck 06/21/2011   OVERACTIVE BLADDER 08/21/2010   Long term current use of anticoagulant 07/25/2010   Osteoarthritis 01/18/2010   KNEE PAIN 01/18/2010   FOOT PAIN 01/18/2010   Mixed hyperlipidemia 04/20/2009   GERD 04/17/2009   MITRAL REGURGITATION, MILD 08/30/2008   NICM (nonischemic cardiomyopathy) ([place]) 08/30/2008   Depression, major, single episode, moderate (HCC) 04/10/2007   DISEASE, MITRAL VALVE NEC/NOS 04/10/2007   Atrial fibrillation ([place]) 04/10/2007   STASIS ULCER 04/10/2007   VENOUS  INSUFFICIENCY, LEGS 04/10/2007    Past Surgical History:  Procedure Laterality Date   ANTERIOR AND POSTERIOR VAGINAL REPAIR W/ SACROSPINOUS LIGAMENT SUSPENSION  2016   [facility]   BLADDER SURGERY     CARPAL TUNNEL RELEASE Right 08/22/2017   Procedure: CARPAL TUNNEL RELEASE;  Surgeon: [name], MD;  Location: [facility];  Service: Orthopedics;  Laterality: Right;   cataract surgery Bilateral    EYE SURGERY     INGUINAL HERNIA REPAIR     right   LIPOMA EXCISION     h/o removed from upper back x 2   REVERSE SHOULDER ARTHROPLASTY Right 08/22/2017   Procedure: REVERSE SHOULDER ARTHROPLASTY;  Surgeon: [name], MD;  Location: [facility];  Service: Orthopedics;  Laterality: Right;     OB History     Gravida      Para      Term      Preterm      AB      Living  3      SAB      IAB      Ectopic      Multiple      Live Births              Family History  Problem Relation Age of Onset   Diabetes Father 43       diabetes and syncope   Heart attack Father    Heart attack Sister    Heart attack Brother        all 5 brothers had MIs   Stroke Brother    Cancer Sister        bone   Diabetes Sister    Cancer Sister    Diabetes Sister    Heart attack Brother    Dementia Mother    Dementia Brother    CAD Other    Diabetes Daughter    Colon cancer Neg Hx    Neuropathy Neg Hx     Social History   Tobacco Use   Smoking status: Never   Smokeless tobacco: Never  Vaping Use   Vaping Use: Never used  Substance Use Topics   Alcohol use: No   Drug use: No    Home Medications Prior to Admission medications   Medication Sig Start Date End Date Taking? Authorizing Provider  moxifloxacin (AVELOX) 400 MG tablet Take 1 tablet (400 mg total) by mouth daily at 8 pm for 5 days. 06/22/21 06/27/21 Yes Marcello Fennel, PA-C  ondansetron (ZOFRAN) 4 MG tablet Take 1 tablet (4 mg total) by mouth every 6 (six) hours. 06/22/21  Yes Marcello Fennel, PA-C   oxyCODONE-acetaminophen (PERCOCET/ROXICET) 5-325 MG tablet Take 1 tablet by mouth every 8 (eight) hours as needed for up to 3 days for severe pain. 06/22/21 06/25/21 Yes Marcello Fennel, PA-C  Cholecalciferol (D3-1000 PO) Take 1,000 Units by mouth daily.    [provider]  Cranberry-Vitamin C-Inulin (UTI-STAT) LIQD Take 30 mLs by mouth 2 (two) times daily.  [provider]  diclofenac Sodium (VOLTAREN) 1 % GEL Apply 4 g topically 4 (four) times daily. Patient not taking: Reported on 04/11/2021 09/23/20   Nita Sells, MD  divalproex (DEPAKOTE ER) 250 MG 24 hr tablet Take 1 tablet (250 mg total) by mouth daily. 09/23/20   Nita Sells, MD  DULoxetine (CYMBALTA) 30 MG capsule Take 1 capsule (30 mg total) by mouth daily. 09/23/20   Nita Sells, MD  ELIQUIS 5 MG TABS tablet TAKE 1 TABLET BY MOUTH EVERY 12 HOURS 11/16/20   Evelina Dun A, FNP  furosemide (LASIX) 20 MG tablet Take 20 mg by mouth daily.    [provider]  hydrOXYzine (ATARAX/VISTARIL) 25 MG tablet Take 25 mg by mouth 3 (three) times daily as needed.    [provider]  linaclotide Rolan Lipa) 72 MCG capsule Take 1 capsule (72 mcg total) by mouth daily before breakfast. 01/19/20   Evelina Dun A, FNP  memantine (NAMENDA) 5 MG tablet Take 1 tablet (5 mg total) by mouth 2 (two) times daily. 09/23/20   Nita Sells, MD  metFORMIN (GLUCOPHAGE) 500 MG tablet Take 500 mg by mouth daily.    [provider]  metoprolol tartrate (LOPRESSOR) 25 MG tablet Take 0.5 tablets (12.5 mg total) by mouth 3 (three) times daily. 09/23/20   Nita Sells, MD  midodrine (PROAMATINE) 2.5 MG tablet Take 1 tablet (2.5 mg total) by mouth 3 (three) times daily with meals. 04/11/21   Minus Breeding, MD  Multiple Vitamin (MULTIVITAMIN) tablet Take 1 tablet by mouth daily.    [provider]  OXYGEN Inhale into the lungs. 2 liters PRN - anytime when needed ( cardio rx'd)     [provider]  pantoprazole (PROTONIX) 40 MG tablet Take 1 tablet (40 mg total) by mouth 2 (two) times daily. 09/23/20   Nita Sells, MD  QUEtiapine (SEROQUEL) 50 MG tablet Take 50 mg by mouth 2 (two) times daily.    [provider]  traMADol (ULTRAM) 50 MG tablet Take 50 mg by mouth every 12 (twelve) hours as needed. Patient not taking: Reported on 04/11/2021    [provider]    Allergies    Bactrim [sulfamethoxazole-trimethoprim], Lipitor [atorvastatin calcium], and Lisinopril  Review of Systems   Review of Systems  Constitutional:  Negative for chills and fever.  HENT:  Negative for congestion.   Respiratory:  Negative for shortness of breath.   Cardiovascular:  Negative for chest pain.  Gastrointestinal:  Positive for abdominal pain. Negative for blood in stool, diarrhea, nausea and vomiting.  Genitourinary:  Negative for enuresis.  Musculoskeletal:  Negative for back pain.  Skin:  Negative for rash.  Neurological:  Negative for dizziness and weakness.  Hematological:  Does not bruise/bleed easily.   Physical Exam Updated Vital Signs BP 138/89    Pulse 89    Temp 97.8 F (36.6 C) (Oral)    Resp 17    Ht 5\' 6"  (1.676 m)    Wt 101.2 kg    SpO2 90%    BMI 36.01 kg/m   Physical Exam Vitals and nursing note reviewed.  Constitutional:      General: She is not in acute distress.    Appearance: She is not ill-appearing.  HENT:     Head: Normocephalic and atraumatic.     Comments: No deformity to head present, no raccoon eyes or battle sign noted.    Nose: No congestion.  Eyes:     Extraocular Movements: Extraocular movements intact.  Conjunctiva/sclera: Conjunctivae normal.  Cardiovascular:     Rate and Rhythm: Normal rate and regular rhythm.     Pulses: Normal pulses.     Heart sounds: No murmur heard.   No friction rub. No gallop.  Pulmonary:     Effort: No respiratory distress.     Breath sounds: No wheezing, rhonchi or rales.   Chest:     Chest wall: No tenderness.  Abdominal:     Palpations: Abdomen is soft.     Tenderness: There is abdominal tenderness. There is no right CVA tenderness or left CVA tenderness.     Comments: Abdomen was visualized there is no ecchymosis present, no other gross abnormality seen on the abdomen, abdomen is nondistended normal bowel sounds, dull to percussion, she has noted right lower quadrant tenderness, there is no guarding, rebound, peritoneal sign.  No CVA tenderness.  Musculoskeletal:     Comments: Patient is moving all 4 extremities without difficulty, she does have slight right knee pain, there is no deformities noted.  Neurovascular fully intact.  Spine was palpated and slight tender to palpation along her lumbar spine no step-off noted.  Pelvis was fully intact no leg shortening no internal or external rotation present.  Skin:    General: Skin is warm and dry.  Neurological:     Mental Status: She is alert.     Comments: No facial asymmetry, no difficult word finding, able follow two-step commands, no unilateral weakness present.  Psychiatric:        Mood and Affect: Mood normal.    ED Results / Procedures / Treatments   Labs (all labs ordered are listed, but only abnormal results are displayed) Labs Reviewed  CBC WITH DIFFERENTIAL/PLATELET - Abnormal; Notable for the following components:      Result Value   WBC 11.8 (*)    Neutro Abs 8.3 (*)    All other components within normal limits  BASIC METABOLIC PANEL - Abnormal; Notable for the following components:   Glucose, Bld 139 (*)    Calcium 8.6 (*)    All other components within normal limits    EKG None  Radiology DG Chest 1 View  Result Date: 06/22/2021 CLINICAL DATA:  Shortness of breath EXAM: CHEST  1 VIEW COMPARISON:  08/05/2018 FINDINGS: Transverse diameter of heart is slightly increased. There are no signs of alveolar pulmonary edema. There are small patchy infiltrates in the lower lung fields,  more so on the left side. There is air density under the right hemidiaphragm possibly due to interposition of colon between the liver and right hemidiaphragm. Similar finding was seen in the previous examination. There is previous arthroplasty in the right shoulder. IMPRESSION: There are small patchy infiltrates in the lower lung fields, more so on the left side suggesting scarring or atelectasis/pneumonia. Possible interposition of colon between liver and right hemidiaphragm. Electronically Signed   By: Elmer Picker M.D.   On: 06/22/2021 12:17   CT ABDOMEN PELVIS W CONTRAST  Result Date: 06/22/2021 CLINICAL DATA:  Right lower quadrant pain EXAM: CT ABDOMEN AND PELVIS WITH CONTRAST TECHNIQUE: Multidetector CT imaging of the abdomen and pelvis was performed using the standard protocol following bolus administration of intravenous contrast. CONTRAST:  138mL OMNIPAQUE IOHEXOL 300 MG/ML  SOLN COMPARISON:  September 18, 1998 FINDINGS: Lower chest: Consolidation lateral left lower lobe is identified. Mild patchy atelectasis of posterior lung bases are noted. There is a small pericardial effusion. Hepatobiliary: There is a cyst in the right lobe liver unchanged.  The liver is otherwise normal. The gallbladder and biliary tree are normal. Pancreas: Unremarkable. No pancreatic ductal dilatation or surrounding inflammatory changes. Spleen: Calcified granuloma is identified within the spleen. The spleen is otherwise unremarkable. Adrenals/Urinary Tract: Adrenal glands are unremarkable. Kidneys are without renal calculi, focal lesion, or hydronephrosis. Scarring of bilateral renal upper pole are noted. Bladder is unremarkable. Stomach/Bowel: Stomach is within normal limits. Appendix appears normal. No evidence of bowel wall thickening, distention, or inflammatory changes. Vascular/Lymphatic: Aortic atherosclerosis. No enlarged abdominal or pelvic lymph nodes. Reproductive: Calcified uterine fibroids are identified in the  uterus. The bilateral adnexa are normal. Other: Small umbilical herniation of mesenteric fat is noted. Musculoskeletal: Degenerative joint changes of the spine are noted. IMPRESSION: 1. No acute abnormality identified in the abdomen and pelvis. The appendix is normal. 2. Left lower lobe pneumonia. 3. Aortic atherosclerosis. Aortic Atherosclerosis (ICD10-I70.0). Electronically Signed   By: Abelardo Diesel M.D.   On: 06/22/2021 12:16   CT L-SPINE NO CHARGE  Result Date: 06/22/2021 CLINICAL DATA:  Fall after syncopal episode EXAM: CT LUMBAR SPINE WITHOUT CONTRAST TECHNIQUE: Multidetector CT imaging of the lumbar spine was performed without intravenous contrast administration. Multiplanar CT image reconstructions were also generated. COMPARISON:  No prior CT of the lumbar spine, correlation is made with CT abdomen pelvis 09/17/2020 FINDINGS: Segmentation: 5 lumbar type vertebrae. Lumbosacral assimilation joints bilaterally L5-S1. Alignment: Scoliosis, with levocurvature of the upper lumbar spine and compensatory dextrocurvature of the lower lumbar spine. Approximately 14 mm left lateral listhesis of L3 on L4. Trace retrolisthesis L1 on L2, L2 on L3, and L4 on L5, which appears degenerative and unchanged compared to 09/17/2020. Vertebrae: Osteopenia. No acute fracture or focal pathologic process. Paraspinal and other soft tissues: Please see same-day CT abdomen pelvis. Disc levels: T12-L1: Disc height loss with vacuum disc phenomenon and disc calcifications. Facet arthropathy. No spinal canal stenosis. Moderate right neural foraminal narrowing. L1-L2: Disc height loss with vacuum disc phenomenon, trace retrolisthesis, and disc osteophyte complex. No spinal canal stenosis. Right-greater-than-left facet arthropathy. Mild right neural foraminal narrowing. L2-L3: Disc height loss with vacuum disc phenomenon, trace retrolisthesis, and disc osteophyte complex. Facet arthropathy. Mild spinal canal stenosis. Mild right neural  foraminal narrowing. L3-L4: Disc height loss with vacuum disc phenomenon and disc osteophyte complex. Severe facet arthropathy. Moderate spinal canal stenosis. No neural foraminal narrowing. L4-L5: Vacuum disc phenomenon, disc height loss, and disc osteophyte complex, with trace retrolisthesis. Left-greater-than-right facet arthropathy. Mild spinal canal stenosis. Moderate greater than right neural foraminal narrowing. L5-S1: Disc height loss with vacuum disc phenomenon and disc osteophyte complex. Facet arthropathy. No spinal canal stenosis. Moderate left neural foraminal narrowing. IMPRESSION: 1. No acute fracture or traumatic listhesis in the lumbar spine. 2. Scoliosis with multilevel degenerative changes, as described above, worst at L3-L4, where there is 14 mm of left lateral listhesis and moderate spinal canal stenosis. Electronically Signed   By: Merilyn Baba M.D.   On: 06/22/2021 12:32   DG Knee Complete 4 Views Right  Result Date: 06/22/2021 CLINICAL DATA:  Pain right knee, recent fall EXAM: RIGHT KNEE - COMPLETE 4+ VIEW COMPARISON:  09/15/2020 FINDINGS: No recent fracture or dislocation is seen. Severe degenerative changes are noted with joint space narrowing and bony spurs, more so in the medial and patellofemoral compartments. There is moderate effusion in the suprapatellar bursa. Extensive vascular calcifications are seen. IMPRESSION: No recent fracture or dislocation is seen. Severe degenerative changes are noted in the right knee. There is moderate effusion in the suprapatellar bursa.  Electronically Signed   By: Elmer Picker M.D.   On: 06/22/2021 12:14    Procedures Procedures   Medications Ordered in ED Medications  oxyCODONE-acetaminophen (PERCOCET/ROXICET) 5-325 MG per tablet 1 tablet (has no administration in time range)  ondansetron (ZOFRAN-ODT) disintegrating tablet 4 mg (has no administration in time range)  iohexol (OMNIPAQUE) 300 MG/ML solution 100 mL (100 mLs  Intravenous Contrast Given 06/22/21 1135)    ED Course  I have reviewed the triage vital signs and the nursing notes.  Pertinent labs & imaging results that were available during my care of the patient were reviewed by me and considered in my medical decision making (see chart for details).    MDM Rules/Calculators/A&P                         Initial impression-presents with a fall.  Alert, no acute stress, vital signs reassuring.  Due to being on Eliquis I am concerned for internal bleed, will obtain CT imaging of the abdomen, add on x-ray of knee and reassess.  Work-up-CBC shows leukocytosis 11.8, BMP shows glucose of 139 calcium 8.6 chest x-ray reveals small patchy infiltrates in the lower lung field more than left suggestive of scarring/pneumonia.  CT abdomen pelvis reveals no acute abnormalities of the pelvis, left lower lobe pneumonia.  CT L-spine reveals no acute abnormalities, does show worsening degenerative disc disease, worse at L3-L4.  EGD negative for acute findings.   Rule out- low suspicion for intracranial head bleed as patient denies loss of conscious, denies actually hitting her head, she does not endorse headaches, paresthesia/weakness in the upper and lower extremities, no focal deficits present on my exam.,  No evidence of trauma noted on my exam.  Low suspicion for spinal cord abnormality or spinal fracture spine was palpated was nontender to palpation, patient has full range of motion in the upper and lower extremities imaging negative for acute findings..  Low suspicion for pneumothorax as lung sounds are clear bilaterally, x-ray is negative for acute findings.  Low suspicion for intra-abdominal trauma as abdomen CT imaging is negative for acute findings.   Plan-  Right lower stomach pain-likely muscular strain, will provide with pain medications, follow-up with PCP as needed. Left lower lobe pneumonia-patient was on doxycycline beginning the month, she had an adverse  reaction to Augmentin  will start her on moxifloxacin, will also start her on Zofran to help with her nausea.  Due to her advanced age and worsening pneumonia feel fluoroquinolone provides more benefit then risks.  Gave strict return precautions.  Vital signs have remained stable, no indication for hospital admission.  Patient discussed with attending and they agreed with assessment and plan.  Patient given at home care as well strict return precautions.  Patient verbalized that they understood agreed to said plan.     Final Clinical Impression(s) / ED Diagnoses Final diagnoses:  Fall, initial encounter  Community acquired pneumonia of left lower lobe of lung    Rx / DC Orders ED Discharge Orders          Ordered    oxyCODONE-acetaminophen (PERCOCET/ROXICET) 5-325 MG tablet  Every 8 hours PRN        06/22/21 1335    moxifloxacin (AVELOX) 400 MG tablet  Daily        06/22/21 1335    ondansetron (ZOFRAN) 4 MG tablet  Every 6 hours        06/22/21 1335  Marcello Fennel, PA-C 67/70/34 [id number]    Campbell Stall P, DO 48/18/59 [id number]

## 2021-06-22 NOTE — ED Notes (Signed)
Left message at [place] at [facility]

## 2021-06-22 NOTE — ED Triage Notes (Signed)
Pt brought in by [facility] from [facility] with c/o RLQ abdominal pain with movement. Pt encountered a fall yesterday morning after syncopal episode and then later complained of the abdominal pain.

## 2021-06-22 NOTE — ED Notes (Signed)
Report given to Juliann Pulse at [facility] facility where pt resides.

## 2021-06-23 NOTE — ED Notes (Signed)
Pt given breakfast tray

## 2021-06-23 NOTE — ED Notes (Signed)
Pam called to check on pt and requested a call when pt is finally picked up.

## 2021-06-23 NOTE — ED Notes (Signed)
Pt O2 sat 87% on RA. Placed pt on 2L [place] O2 sat now at 93%

## 2021-06-23 NOTE — ED Notes (Signed)
Daughter decided to take pt back to facility. [facility] aware pt is on her way back.

## 2021-06-25 DIAGNOSIS — G35 Multiple sclerosis: Secondary | ICD-10-CM | POA: Diagnosis not present

## 2021-06-25 DIAGNOSIS — M6281 Muscle weakness (generalized): Secondary | ICD-10-CM | POA: Diagnosis not present

## 2021-06-25 DIAGNOSIS — R262 Difficulty in walking, not elsewhere classified: Secondary | ICD-10-CM | POA: Diagnosis not present

## 2021-06-25 DIAGNOSIS — I4819 Other persistent atrial fibrillation: Secondary | ICD-10-CM | POA: Diagnosis not present

## 2021-06-25 DIAGNOSIS — R2681 Unsteadiness on feet: Secondary | ICD-10-CM | POA: Diagnosis not present

## 2021-06-25 DIAGNOSIS — R55 Syncope and collapse: Secondary | ICD-10-CM | POA: Diagnosis not present

## 2021-06-27 DIAGNOSIS — I5022 Chronic systolic (congestive) heart failure: Secondary | ICD-10-CM | POA: Diagnosis not present

## 2021-06-27 DIAGNOSIS — M6281 Muscle weakness (generalized): Secondary | ICD-10-CM | POA: Diagnosis not present

## 2021-06-27 DIAGNOSIS — E038 Other specified hypothyroidism: Secondary | ICD-10-CM | POA: Diagnosis not present

## 2021-06-27 DIAGNOSIS — R2681 Unsteadiness on feet: Secondary | ICD-10-CM | POA: Diagnosis not present

## 2021-06-27 DIAGNOSIS — D518 Other vitamin B12 deficiency anemias: Secondary | ICD-10-CM | POA: Diagnosis not present

## 2021-06-27 DIAGNOSIS — I160 Hypertensive urgency: Secondary | ICD-10-CM | POA: Diagnosis not present

## 2021-06-27 DIAGNOSIS — I5021 Acute systolic (congestive) heart failure: Secondary | ICD-10-CM | POA: Diagnosis not present

## 2021-06-27 DIAGNOSIS — E559 Vitamin D deficiency, unspecified: Secondary | ICD-10-CM | POA: Diagnosis not present

## 2021-06-27 DIAGNOSIS — G35 Multiple sclerosis: Secondary | ICD-10-CM | POA: Diagnosis not present

## 2021-06-27 DIAGNOSIS — I4819 Other persistent atrial fibrillation: Secondary | ICD-10-CM | POA: Diagnosis not present

## 2021-06-27 DIAGNOSIS — R262 Difficulty in walking, not elsewhere classified: Secondary | ICD-10-CM | POA: Diagnosis not present

## 2021-06-27 DIAGNOSIS — R55 Syncope and collapse: Secondary | ICD-10-CM | POA: Diagnosis not present

## 2021-06-27 DIAGNOSIS — I4891 Unspecified atrial fibrillation: Secondary | ICD-10-CM | POA: Diagnosis not present

## 2021-06-27 DIAGNOSIS — E119 Type 2 diabetes mellitus without complications: Secondary | ICD-10-CM | POA: Diagnosis not present

## 2021-06-27 DIAGNOSIS — F0390 Unspecified dementia without behavioral disturbance: Secondary | ICD-10-CM | POA: Diagnosis not present

## 2021-06-27 DIAGNOSIS — I10 Essential (primary) hypertension: Secondary | ICD-10-CM | POA: Diagnosis not present

## 2021-06-27 DIAGNOSIS — M1990 Unspecified osteoarthritis, unspecified site: Secondary | ICD-10-CM | POA: Diagnosis not present

## 2021-06-27 DIAGNOSIS — E781 Pure hyperglyceridemia: Secondary | ICD-10-CM | POA: Diagnosis not present

## 2021-07-06 DIAGNOSIS — G35 Multiple sclerosis: Secondary | ICD-10-CM | POA: Diagnosis not present

## 2021-07-06 DIAGNOSIS — M6281 Muscle weakness (generalized): Secondary | ICD-10-CM | POA: Diagnosis not present

## 2021-07-06 DIAGNOSIS — R262 Difficulty in walking, not elsewhere classified: Secondary | ICD-10-CM | POA: Diagnosis not present

## 2021-07-06 DIAGNOSIS — R2681 Unsteadiness on feet: Secondary | ICD-10-CM | POA: Diagnosis not present

## 2021-07-06 DIAGNOSIS — R55 Syncope and collapse: Secondary | ICD-10-CM | POA: Diagnosis not present

## 2021-07-06 DIAGNOSIS — I4819 Other persistent atrial fibrillation: Secondary | ICD-10-CM | POA: Diagnosis not present

## 2021-07-09 DIAGNOSIS — M6281 Muscle weakness (generalized): Secondary | ICD-10-CM | POA: Diagnosis not present

## 2021-07-09 DIAGNOSIS — R55 Syncope and collapse: Secondary | ICD-10-CM | POA: Diagnosis not present

## 2021-07-09 DIAGNOSIS — R2681 Unsteadiness on feet: Secondary | ICD-10-CM | POA: Diagnosis not present

## 2021-07-09 DIAGNOSIS — U071 COVID-19: Secondary | ICD-10-CM | POA: Diagnosis not present

## 2021-07-09 DIAGNOSIS — R262 Difficulty in walking, not elsewhere classified: Secondary | ICD-10-CM | POA: Diagnosis not present

## 2021-07-09 DIAGNOSIS — I4819 Other persistent atrial fibrillation: Secondary | ICD-10-CM | POA: Diagnosis not present

## 2021-07-09 DIAGNOSIS — Z20828 Contact with and (suspected) exposure to other viral communicable diseases: Secondary | ICD-10-CM | POA: Diagnosis not present

## 2021-07-09 DIAGNOSIS — G35 Multiple sclerosis: Secondary | ICD-10-CM | POA: Diagnosis not present

## 2021-07-11 DIAGNOSIS — G35 Multiple sclerosis: Secondary | ICD-10-CM | POA: Diagnosis not present

## 2021-07-11 DIAGNOSIS — I4819 Other persistent atrial fibrillation: Secondary | ICD-10-CM | POA: Diagnosis not present

## 2021-07-11 DIAGNOSIS — R55 Syncope and collapse: Secondary | ICD-10-CM | POA: Diagnosis not present

## 2021-07-11 DIAGNOSIS — R2681 Unsteadiness on feet: Secondary | ICD-10-CM | POA: Diagnosis not present

## 2021-07-11 DIAGNOSIS — M6281 Muscle weakness (generalized): Secondary | ICD-10-CM | POA: Diagnosis not present

## 2021-07-11 DIAGNOSIS — R262 Difficulty in walking, not elsewhere classified: Secondary | ICD-10-CM | POA: Diagnosis not present

## 2021-07-12 DIAGNOSIS — G8929 Other chronic pain: Secondary | ICD-10-CM | POA: Diagnosis not present

## 2021-07-12 DIAGNOSIS — E559 Vitamin D deficiency, unspecified: Secondary | ICD-10-CM | POA: Diagnosis not present

## 2021-07-12 DIAGNOSIS — I4891 Unspecified atrial fibrillation: Secondary | ICD-10-CM | POA: Diagnosis not present

## 2021-07-12 DIAGNOSIS — R0609 Other forms of dyspnea: Secondary | ICD-10-CM | POA: Diagnosis not present

## 2021-07-12 DIAGNOSIS — I5022 Chronic systolic (congestive) heart failure: Secondary | ICD-10-CM | POA: Diagnosis not present

## 2021-07-12 DIAGNOSIS — E781 Pure hyperglyceridemia: Secondary | ICD-10-CM | POA: Diagnosis not present

## 2021-07-12 DIAGNOSIS — E1159 Type 2 diabetes mellitus with other circulatory complications: Secondary | ICD-10-CM | POA: Diagnosis not present

## 2021-07-12 DIAGNOSIS — L603 Nail dystrophy: Secondary | ICD-10-CM | POA: Diagnosis not present

## 2021-07-12 DIAGNOSIS — K5900 Constipation, unspecified: Secondary | ICD-10-CM | POA: Diagnosis not present

## 2021-07-12 DIAGNOSIS — M1990 Unspecified osteoarthritis, unspecified site: Secondary | ICD-10-CM | POA: Diagnosis not present

## 2021-07-12 DIAGNOSIS — R42 Dizziness and giddiness: Secondary | ICD-10-CM | POA: Diagnosis not present

## 2021-07-12 DIAGNOSIS — R600 Localized edema: Secondary | ICD-10-CM | POA: Diagnosis not present

## 2021-07-12 DIAGNOSIS — H8110 Benign paroxysmal vertigo, unspecified ear: Secondary | ICD-10-CM | POA: Diagnosis not present

## 2021-07-12 DIAGNOSIS — R55 Syncope and collapse: Secondary | ICD-10-CM | POA: Diagnosis not present

## 2021-07-13 DIAGNOSIS — R55 Syncope and collapse: Secondary | ICD-10-CM | POA: Diagnosis not present

## 2021-07-13 DIAGNOSIS — F5102 Adjustment insomnia: Secondary | ICD-10-CM | POA: Diagnosis not present

## 2021-07-13 DIAGNOSIS — M6281 Muscle weakness (generalized): Secondary | ICD-10-CM | POA: Diagnosis not present

## 2021-07-13 DIAGNOSIS — R4189 Other symptoms and signs involving cognitive functions and awareness: Secondary | ICD-10-CM | POA: Diagnosis not present

## 2021-07-13 DIAGNOSIS — R2681 Unsteadiness on feet: Secondary | ICD-10-CM | POA: Diagnosis not present

## 2021-07-13 DIAGNOSIS — F329 Major depressive disorder, single episode, unspecified: Secondary | ICD-10-CM | POA: Diagnosis not present

## 2021-07-13 DIAGNOSIS — I4819 Other persistent atrial fibrillation: Secondary | ICD-10-CM | POA: Diagnosis not present

## 2021-07-13 DIAGNOSIS — F0390 Unspecified dementia without behavioral disturbance: Secondary | ICD-10-CM | POA: Diagnosis not present

## 2021-07-13 DIAGNOSIS — R262 Difficulty in walking, not elsewhere classified: Secondary | ICD-10-CM | POA: Diagnosis not present

## 2021-07-13 DIAGNOSIS — G35 Multiple sclerosis: Secondary | ICD-10-CM | POA: Diagnosis not present

## 2021-07-16 DIAGNOSIS — E7849 Other hyperlipidemia: Secondary | ICD-10-CM | POA: Diagnosis not present

## 2021-07-16 DIAGNOSIS — U071 COVID-19: Secondary | ICD-10-CM | POA: Diagnosis not present

## 2021-07-16 DIAGNOSIS — Z79899 Other long term (current) drug therapy: Secondary | ICD-10-CM | POA: Diagnosis not present

## 2021-07-16 DIAGNOSIS — E038 Other specified hypothyroidism: Secondary | ICD-10-CM | POA: Diagnosis not present

## 2021-07-16 DIAGNOSIS — D518 Other vitamin B12 deficiency anemias: Secondary | ICD-10-CM | POA: Diagnosis not present

## 2021-07-16 DIAGNOSIS — E559 Vitamin D deficiency, unspecified: Secondary | ICD-10-CM | POA: Diagnosis not present

## 2021-07-16 DIAGNOSIS — R262 Difficulty in walking, not elsewhere classified: Secondary | ICD-10-CM | POA: Diagnosis not present

## 2021-07-16 DIAGNOSIS — R2681 Unsteadiness on feet: Secondary | ICD-10-CM | POA: Diagnosis not present

## 2021-07-16 DIAGNOSIS — G35 Multiple sclerosis: Secondary | ICD-10-CM | POA: Diagnosis not present

## 2021-07-16 DIAGNOSIS — M6281 Muscle weakness (generalized): Secondary | ICD-10-CM | POA: Diagnosis not present

## 2021-07-16 DIAGNOSIS — R55 Syncope and collapse: Secondary | ICD-10-CM | POA: Diagnosis not present

## 2021-07-16 DIAGNOSIS — Z20828 Contact with and (suspected) exposure to other viral communicable diseases: Secondary | ICD-10-CM | POA: Diagnosis not present

## 2021-07-16 DIAGNOSIS — E119 Type 2 diabetes mellitus without complications: Secondary | ICD-10-CM | POA: Diagnosis not present

## 2021-07-16 DIAGNOSIS — I4819 Other persistent atrial fibrillation: Secondary | ICD-10-CM | POA: Diagnosis not present

## 2021-07-18 DIAGNOSIS — R55 Syncope and collapse: Secondary | ICD-10-CM | POA: Diagnosis not present

## 2021-07-18 DIAGNOSIS — G35 Multiple sclerosis: Secondary | ICD-10-CM | POA: Diagnosis not present

## 2021-07-18 DIAGNOSIS — R262 Difficulty in walking, not elsewhere classified: Secondary | ICD-10-CM | POA: Diagnosis not present

## 2021-07-18 DIAGNOSIS — I4819 Other persistent atrial fibrillation: Secondary | ICD-10-CM | POA: Diagnosis not present

## 2021-07-18 DIAGNOSIS — R2681 Unsteadiness on feet: Secondary | ICD-10-CM | POA: Diagnosis not present

## 2021-07-18 DIAGNOSIS — M6281 Muscle weakness (generalized): Secondary | ICD-10-CM | POA: Diagnosis not present

## 2021-07-18 DIAGNOSIS — F4320 Adjustment disorder, unspecified: Secondary | ICD-10-CM | POA: Diagnosis not present

## 2021-07-20 DIAGNOSIS — I10 Essential (primary) hypertension: Secondary | ICD-10-CM | POA: Diagnosis not present

## 2021-07-20 DIAGNOSIS — R262 Difficulty in walking, not elsewhere classified: Secondary | ICD-10-CM | POA: Diagnosis not present

## 2021-07-20 DIAGNOSIS — M6281 Muscle weakness (generalized): Secondary | ICD-10-CM | POA: Diagnosis not present

## 2021-07-20 DIAGNOSIS — R55 Syncope and collapse: Secondary | ICD-10-CM | POA: Diagnosis not present

## 2021-07-20 DIAGNOSIS — R2681 Unsteadiness on feet: Secondary | ICD-10-CM | POA: Diagnosis not present

## 2021-07-20 DIAGNOSIS — I4819 Other persistent atrial fibrillation: Secondary | ICD-10-CM | POA: Diagnosis not present

## 2021-07-20 DIAGNOSIS — G35 Multiple sclerosis: Secondary | ICD-10-CM | POA: Diagnosis not present

## 2021-07-23 DIAGNOSIS — I4819 Other persistent atrial fibrillation: Secondary | ICD-10-CM | POA: Diagnosis not present

## 2021-07-23 DIAGNOSIS — R2681 Unsteadiness on feet: Secondary | ICD-10-CM | POA: Diagnosis not present

## 2021-07-23 DIAGNOSIS — R55 Syncope and collapse: Secondary | ICD-10-CM | POA: Diagnosis not present

## 2021-07-23 DIAGNOSIS — R262 Difficulty in walking, not elsewhere classified: Secondary | ICD-10-CM | POA: Diagnosis not present

## 2021-07-23 DIAGNOSIS — M6281 Muscle weakness (generalized): Secondary | ICD-10-CM | POA: Diagnosis not present

## 2021-07-23 DIAGNOSIS — G35 Multiple sclerosis: Secondary | ICD-10-CM | POA: Diagnosis not present

## 2021-07-25 DIAGNOSIS — M6281 Muscle weakness (generalized): Secondary | ICD-10-CM | POA: Diagnosis not present

## 2021-07-25 DIAGNOSIS — R262 Difficulty in walking, not elsewhere classified: Secondary | ICD-10-CM | POA: Diagnosis not present

## 2021-07-25 DIAGNOSIS — R2681 Unsteadiness on feet: Secondary | ICD-10-CM | POA: Diagnosis not present

## 2021-07-25 DIAGNOSIS — R55 Syncope and collapse: Secondary | ICD-10-CM | POA: Diagnosis not present

## 2021-07-25 DIAGNOSIS — G35 Multiple sclerosis: Secondary | ICD-10-CM | POA: Diagnosis not present

## 2021-07-25 DIAGNOSIS — I4819 Other persistent atrial fibrillation: Secondary | ICD-10-CM | POA: Diagnosis not present

## 2021-07-27 DIAGNOSIS — E559 Vitamin D deficiency, unspecified: Secondary | ICD-10-CM | POA: Diagnosis not present

## 2021-07-27 DIAGNOSIS — I5021 Acute systolic (congestive) heart failure: Secondary | ICD-10-CM | POA: Diagnosis not present

## 2021-07-27 DIAGNOSIS — D518 Other vitamin B12 deficiency anemias: Secondary | ICD-10-CM | POA: Diagnosis not present

## 2021-07-27 DIAGNOSIS — I10 Essential (primary) hypertension: Secondary | ICD-10-CM | POA: Diagnosis not present

## 2021-07-27 DIAGNOSIS — E038 Other specified hypothyroidism: Secondary | ICD-10-CM | POA: Diagnosis not present

## 2021-07-27 DIAGNOSIS — G35 Multiple sclerosis: Secondary | ICD-10-CM | POA: Diagnosis not present

## 2021-07-27 DIAGNOSIS — R55 Syncope and collapse: Secondary | ICD-10-CM | POA: Diagnosis not present

## 2021-07-27 DIAGNOSIS — M1990 Unspecified osteoarthritis, unspecified site: Secondary | ICD-10-CM | POA: Diagnosis not present

## 2021-07-27 DIAGNOSIS — R2681 Unsteadiness on feet: Secondary | ICD-10-CM | POA: Diagnosis not present

## 2021-07-27 DIAGNOSIS — I5022 Chronic systolic (congestive) heart failure: Secondary | ICD-10-CM | POA: Diagnosis not present

## 2021-07-27 DIAGNOSIS — E119 Type 2 diabetes mellitus without complications: Secondary | ICD-10-CM | POA: Diagnosis not present

## 2021-07-27 DIAGNOSIS — R262 Difficulty in walking, not elsewhere classified: Secondary | ICD-10-CM | POA: Diagnosis not present

## 2021-07-27 DIAGNOSIS — F0390 Unspecified dementia without behavioral disturbance: Secondary | ICD-10-CM | POA: Diagnosis not present

## 2021-07-27 DIAGNOSIS — I4891 Unspecified atrial fibrillation: Secondary | ICD-10-CM | POA: Diagnosis not present

## 2021-07-27 DIAGNOSIS — I4819 Other persistent atrial fibrillation: Secondary | ICD-10-CM | POA: Diagnosis not present

## 2021-07-27 DIAGNOSIS — M6281 Muscle weakness (generalized): Secondary | ICD-10-CM | POA: Diagnosis not present

## 2021-07-27 DIAGNOSIS — I160 Hypertensive urgency: Secondary | ICD-10-CM | POA: Diagnosis not present

## 2021-07-27 DIAGNOSIS — E781 Pure hyperglyceridemia: Secondary | ICD-10-CM | POA: Diagnosis not present

## 2021-07-31 DIAGNOSIS — M6281 Muscle weakness (generalized): Secondary | ICD-10-CM | POA: Diagnosis not present

## 2021-07-31 DIAGNOSIS — I4819 Other persistent atrial fibrillation: Secondary | ICD-10-CM | POA: Diagnosis not present

## 2021-07-31 DIAGNOSIS — G35 Multiple sclerosis: Secondary | ICD-10-CM | POA: Diagnosis not present

## 2021-07-31 DIAGNOSIS — R2681 Unsteadiness on feet: Secondary | ICD-10-CM | POA: Diagnosis not present

## 2021-07-31 DIAGNOSIS — R262 Difficulty in walking, not elsewhere classified: Secondary | ICD-10-CM | POA: Diagnosis not present

## 2021-07-31 DIAGNOSIS — R55 Syncope and collapse: Secondary | ICD-10-CM | POA: Diagnosis not present

## 2021-08-02 DIAGNOSIS — F329 Major depressive disorder, single episode, unspecified: Secondary | ICD-10-CM | POA: Diagnosis not present

## 2021-08-02 DIAGNOSIS — R262 Difficulty in walking, not elsewhere classified: Secondary | ICD-10-CM | POA: Diagnosis not present

## 2021-08-02 DIAGNOSIS — R55 Syncope and collapse: Secondary | ICD-10-CM | POA: Diagnosis not present

## 2021-08-02 DIAGNOSIS — G35 Multiple sclerosis: Secondary | ICD-10-CM | POA: Diagnosis not present

## 2021-08-02 DIAGNOSIS — F5102 Adjustment insomnia: Secondary | ICD-10-CM | POA: Diagnosis not present

## 2021-08-02 DIAGNOSIS — R2681 Unsteadiness on feet: Secondary | ICD-10-CM | POA: Diagnosis not present

## 2021-08-02 DIAGNOSIS — R4189 Other symptoms and signs involving cognitive functions and awareness: Secondary | ICD-10-CM | POA: Diagnosis not present

## 2021-08-02 DIAGNOSIS — M6281 Muscle weakness (generalized): Secondary | ICD-10-CM | POA: Diagnosis not present

## 2021-08-02 DIAGNOSIS — I4819 Other persistent atrial fibrillation: Secondary | ICD-10-CM | POA: Diagnosis not present

## 2021-08-03 DIAGNOSIS — M6281 Muscle weakness (generalized): Secondary | ICD-10-CM | POA: Diagnosis not present

## 2021-08-03 DIAGNOSIS — G35 Multiple sclerosis: Secondary | ICD-10-CM | POA: Diagnosis not present

## 2021-08-03 DIAGNOSIS — R2681 Unsteadiness on feet: Secondary | ICD-10-CM | POA: Diagnosis not present

## 2021-08-03 DIAGNOSIS — R262 Difficulty in walking, not elsewhere classified: Secondary | ICD-10-CM | POA: Diagnosis not present

## 2021-08-03 DIAGNOSIS — I4819 Other persistent atrial fibrillation: Secondary | ICD-10-CM | POA: Diagnosis not present

## 2021-08-03 DIAGNOSIS — R55 Syncope and collapse: Secondary | ICD-10-CM | POA: Diagnosis not present

## 2021-08-08 DIAGNOSIS — F4320 Adjustment disorder, unspecified: Secondary | ICD-10-CM | POA: Diagnosis not present

## 2021-08-08 DIAGNOSIS — I4819 Other persistent atrial fibrillation: Secondary | ICD-10-CM | POA: Diagnosis not present

## 2021-08-08 DIAGNOSIS — G35 Multiple sclerosis: Secondary | ICD-10-CM | POA: Diagnosis not present

## 2021-08-08 DIAGNOSIS — M6281 Muscle weakness (generalized): Secondary | ICD-10-CM | POA: Diagnosis not present

## 2021-08-08 DIAGNOSIS — R55 Syncope and collapse: Secondary | ICD-10-CM | POA: Diagnosis not present

## 2021-08-08 DIAGNOSIS — R2681 Unsteadiness on feet: Secondary | ICD-10-CM | POA: Diagnosis not present

## 2021-08-08 DIAGNOSIS — R262 Difficulty in walking, not elsewhere classified: Secondary | ICD-10-CM | POA: Diagnosis not present

## 2021-08-09 DIAGNOSIS — I5022 Chronic systolic (congestive) heart failure: Secondary | ICD-10-CM | POA: Diagnosis not present

## 2021-08-09 DIAGNOSIS — E781 Pure hyperglyceridemia: Secondary | ICD-10-CM | POA: Diagnosis not present

## 2021-08-09 DIAGNOSIS — K219 Gastro-esophageal reflux disease without esophagitis: Secondary | ICD-10-CM | POA: Diagnosis not present

## 2021-08-09 DIAGNOSIS — R42 Dizziness and giddiness: Secondary | ICD-10-CM | POA: Diagnosis not present

## 2021-08-09 DIAGNOSIS — H8110 Benign paroxysmal vertigo, unspecified ear: Secondary | ICD-10-CM | POA: Diagnosis not present

## 2021-08-09 DIAGNOSIS — F0390 Unspecified dementia without behavioral disturbance: Secondary | ICD-10-CM | POA: Diagnosis not present

## 2021-08-09 DIAGNOSIS — I129 Hypertensive chronic kidney disease with stage 1 through stage 4 chronic kidney disease, or unspecified chronic kidney disease: Secondary | ICD-10-CM | POA: Diagnosis not present

## 2021-08-09 DIAGNOSIS — F418 Other specified anxiety disorders: Secondary | ICD-10-CM | POA: Diagnosis not present

## 2021-08-09 DIAGNOSIS — N1831 Chronic kidney disease, stage 3a: Secondary | ICD-10-CM | POA: Diagnosis not present

## 2021-08-09 DIAGNOSIS — N183 Chronic kidney disease, stage 3 unspecified: Secondary | ICD-10-CM | POA: Diagnosis not present

## 2021-08-09 DIAGNOSIS — I4891 Unspecified atrial fibrillation: Secondary | ICD-10-CM | POA: Diagnosis not present

## 2021-08-09 DIAGNOSIS — E785 Hyperlipidemia, unspecified: Secondary | ICD-10-CM | POA: Diagnosis not present

## 2021-08-10 DIAGNOSIS — G35 Multiple sclerosis: Secondary | ICD-10-CM | POA: Diagnosis not present

## 2021-08-10 DIAGNOSIS — R55 Syncope and collapse: Secondary | ICD-10-CM | POA: Diagnosis not present

## 2021-08-10 DIAGNOSIS — I4819 Other persistent atrial fibrillation: Secondary | ICD-10-CM | POA: Diagnosis not present

## 2021-08-10 DIAGNOSIS — R262 Difficulty in walking, not elsewhere classified: Secondary | ICD-10-CM | POA: Diagnosis not present

## 2021-08-10 DIAGNOSIS — R2681 Unsteadiness on feet: Secondary | ICD-10-CM | POA: Diagnosis not present

## 2021-08-10 DIAGNOSIS — M6281 Muscle weakness (generalized): Secondary | ICD-10-CM | POA: Diagnosis not present

## 2021-08-13 DIAGNOSIS — I4819 Other persistent atrial fibrillation: Secondary | ICD-10-CM | POA: Diagnosis not present

## 2021-08-13 DIAGNOSIS — R262 Difficulty in walking, not elsewhere classified: Secondary | ICD-10-CM | POA: Diagnosis not present

## 2021-08-13 DIAGNOSIS — M6281 Muscle weakness (generalized): Secondary | ICD-10-CM | POA: Diagnosis not present

## 2021-08-13 DIAGNOSIS — G35 Multiple sclerosis: Secondary | ICD-10-CM | POA: Diagnosis not present

## 2021-08-13 DIAGNOSIS — R2681 Unsteadiness on feet: Secondary | ICD-10-CM | POA: Diagnosis not present

## 2021-08-13 DIAGNOSIS — R55 Syncope and collapse: Secondary | ICD-10-CM | POA: Diagnosis not present

## 2021-08-15 DIAGNOSIS — R262 Difficulty in walking, not elsewhere classified: Secondary | ICD-10-CM | POA: Diagnosis not present

## 2021-08-15 DIAGNOSIS — M6281 Muscle weakness (generalized): Secondary | ICD-10-CM | POA: Diagnosis not present

## 2021-08-15 DIAGNOSIS — R2681 Unsteadiness on feet: Secondary | ICD-10-CM | POA: Diagnosis not present

## 2021-08-15 DIAGNOSIS — I4819 Other persistent atrial fibrillation: Secondary | ICD-10-CM | POA: Diagnosis not present

## 2021-08-15 DIAGNOSIS — R55 Syncope and collapse: Secondary | ICD-10-CM | POA: Diagnosis not present

## 2021-08-15 DIAGNOSIS — G35 Multiple sclerosis: Secondary | ICD-10-CM | POA: Diagnosis not present

## 2021-08-17 DIAGNOSIS — M6281 Muscle weakness (generalized): Secondary | ICD-10-CM | POA: Diagnosis not present

## 2021-08-17 DIAGNOSIS — I4819 Other persistent atrial fibrillation: Secondary | ICD-10-CM | POA: Diagnosis not present

## 2021-08-17 DIAGNOSIS — R55 Syncope and collapse: Secondary | ICD-10-CM | POA: Diagnosis not present

## 2021-08-17 DIAGNOSIS — R262 Difficulty in walking, not elsewhere classified: Secondary | ICD-10-CM | POA: Diagnosis not present

## 2021-08-17 DIAGNOSIS — R2681 Unsteadiness on feet: Secondary | ICD-10-CM | POA: Diagnosis not present

## 2021-08-17 DIAGNOSIS — G35 Multiple sclerosis: Secondary | ICD-10-CM | POA: Diagnosis not present

## 2021-08-20 DIAGNOSIS — M6281 Muscle weakness (generalized): Secondary | ICD-10-CM | POA: Diagnosis not present

## 2021-08-20 DIAGNOSIS — G35 Multiple sclerosis: Secondary | ICD-10-CM | POA: Diagnosis not present

## 2021-08-20 DIAGNOSIS — R2681 Unsteadiness on feet: Secondary | ICD-10-CM | POA: Diagnosis not present

## 2021-08-20 DIAGNOSIS — R262 Difficulty in walking, not elsewhere classified: Secondary | ICD-10-CM | POA: Diagnosis not present

## 2021-08-20 DIAGNOSIS — I10 Essential (primary) hypertension: Secondary | ICD-10-CM | POA: Diagnosis not present

## 2021-08-20 DIAGNOSIS — I4819 Other persistent atrial fibrillation: Secondary | ICD-10-CM | POA: Diagnosis not present

## 2021-08-20 DIAGNOSIS — R55 Syncope and collapse: Secondary | ICD-10-CM | POA: Diagnosis not present

## 2021-08-22 DIAGNOSIS — M6281 Muscle weakness (generalized): Secondary | ICD-10-CM | POA: Diagnosis not present

## 2021-08-22 DIAGNOSIS — R55 Syncope and collapse: Secondary | ICD-10-CM | POA: Diagnosis not present

## 2021-08-22 DIAGNOSIS — G35 Multiple sclerosis: Secondary | ICD-10-CM | POA: Diagnosis not present

## 2021-08-22 DIAGNOSIS — I4819 Other persistent atrial fibrillation: Secondary | ICD-10-CM | POA: Diagnosis not present

## 2021-08-22 DIAGNOSIS — R262 Difficulty in walking, not elsewhere classified: Secondary | ICD-10-CM | POA: Diagnosis not present

## 2021-08-22 DIAGNOSIS — R2681 Unsteadiness on feet: Secondary | ICD-10-CM | POA: Diagnosis not present

## 2021-08-24 DIAGNOSIS — R2681 Unsteadiness on feet: Secondary | ICD-10-CM | POA: Diagnosis not present

## 2021-08-24 DIAGNOSIS — I4819 Other persistent atrial fibrillation: Secondary | ICD-10-CM | POA: Diagnosis not present

## 2021-08-24 DIAGNOSIS — D518 Other vitamin B12 deficiency anemias: Secondary | ICD-10-CM | POA: Diagnosis not present

## 2021-08-24 DIAGNOSIS — R55 Syncope and collapse: Secondary | ICD-10-CM | POA: Diagnosis not present

## 2021-08-24 DIAGNOSIS — F0390 Unspecified dementia without behavioral disturbance: Secondary | ICD-10-CM | POA: Diagnosis not present

## 2021-08-24 DIAGNOSIS — I5021 Acute systolic (congestive) heart failure: Secondary | ICD-10-CM | POA: Diagnosis not present

## 2021-08-24 DIAGNOSIS — I5022 Chronic systolic (congestive) heart failure: Secondary | ICD-10-CM | POA: Diagnosis not present

## 2021-08-24 DIAGNOSIS — G35 Multiple sclerosis: Secondary | ICD-10-CM | POA: Diagnosis not present

## 2021-08-24 DIAGNOSIS — E781 Pure hyperglyceridemia: Secondary | ICD-10-CM | POA: Diagnosis not present

## 2021-08-24 DIAGNOSIS — I160 Hypertensive urgency: Secondary | ICD-10-CM | POA: Diagnosis not present

## 2021-08-24 DIAGNOSIS — E038 Other specified hypothyroidism: Secondary | ICD-10-CM | POA: Diagnosis not present

## 2021-08-24 DIAGNOSIS — E559 Vitamin D deficiency, unspecified: Secondary | ICD-10-CM | POA: Diagnosis not present

## 2021-08-24 DIAGNOSIS — R262 Difficulty in walking, not elsewhere classified: Secondary | ICD-10-CM | POA: Diagnosis not present

## 2021-08-24 DIAGNOSIS — M1990 Unspecified osteoarthritis, unspecified site: Secondary | ICD-10-CM | POA: Diagnosis not present

## 2021-08-24 DIAGNOSIS — M6281 Muscle weakness (generalized): Secondary | ICD-10-CM | POA: Diagnosis not present

## 2021-08-24 DIAGNOSIS — I10 Essential (primary) hypertension: Secondary | ICD-10-CM | POA: Diagnosis not present

## 2021-08-24 DIAGNOSIS — I4891 Unspecified atrial fibrillation: Secondary | ICD-10-CM | POA: Diagnosis not present

## 2021-08-24 DIAGNOSIS — E119 Type 2 diabetes mellitus without complications: Secondary | ICD-10-CM | POA: Diagnosis not present

## 2021-08-27 DIAGNOSIS — I4819 Other persistent atrial fibrillation: Secondary | ICD-10-CM | POA: Diagnosis not present

## 2021-08-27 DIAGNOSIS — M6281 Muscle weakness (generalized): Secondary | ICD-10-CM | POA: Diagnosis not present

## 2021-08-27 DIAGNOSIS — R2681 Unsteadiness on feet: Secondary | ICD-10-CM | POA: Diagnosis not present

## 2021-08-27 DIAGNOSIS — G35 Multiple sclerosis: Secondary | ICD-10-CM | POA: Diagnosis not present

## 2021-08-27 DIAGNOSIS — R262 Difficulty in walking, not elsewhere classified: Secondary | ICD-10-CM | POA: Diagnosis not present

## 2021-08-27 DIAGNOSIS — R55 Syncope and collapse: Secondary | ICD-10-CM | POA: Diagnosis not present

## 2021-08-29 DIAGNOSIS — R2681 Unsteadiness on feet: Secondary | ICD-10-CM | POA: Diagnosis not present

## 2021-08-29 DIAGNOSIS — F4320 Adjustment disorder, unspecified: Secondary | ICD-10-CM | POA: Diagnosis not present

## 2021-08-29 DIAGNOSIS — G35 Multiple sclerosis: Secondary | ICD-10-CM | POA: Diagnosis not present

## 2021-08-29 DIAGNOSIS — R55 Syncope and collapse: Secondary | ICD-10-CM | POA: Diagnosis not present

## 2021-08-29 DIAGNOSIS — I4819 Other persistent atrial fibrillation: Secondary | ICD-10-CM | POA: Diagnosis not present

## 2021-08-29 DIAGNOSIS — R262 Difficulty in walking, not elsewhere classified: Secondary | ICD-10-CM | POA: Diagnosis not present

## 2021-08-29 DIAGNOSIS — M6281 Muscle weakness (generalized): Secondary | ICD-10-CM | POA: Diagnosis not present

## 2021-08-30 DIAGNOSIS — F5102 Adjustment insomnia: Secondary | ICD-10-CM | POA: Diagnosis not present

## 2021-08-30 DIAGNOSIS — R4189 Other symptoms and signs involving cognitive functions and awareness: Secondary | ICD-10-CM | POA: Diagnosis not present

## 2021-08-30 DIAGNOSIS — F329 Major depressive disorder, single episode, unspecified: Secondary | ICD-10-CM | POA: Diagnosis not present

## 2021-08-31 DIAGNOSIS — G35 Multiple sclerosis: Secondary | ICD-10-CM | POA: Diagnosis not present

## 2021-08-31 DIAGNOSIS — I4819 Other persistent atrial fibrillation: Secondary | ICD-10-CM | POA: Diagnosis not present

## 2021-08-31 DIAGNOSIS — R262 Difficulty in walking, not elsewhere classified: Secondary | ICD-10-CM | POA: Diagnosis not present

## 2021-08-31 DIAGNOSIS — R2681 Unsteadiness on feet: Secondary | ICD-10-CM | POA: Diagnosis not present

## 2021-08-31 DIAGNOSIS — R55 Syncope and collapse: Secondary | ICD-10-CM | POA: Diagnosis not present

## 2021-08-31 DIAGNOSIS — M6281 Muscle weakness (generalized): Secondary | ICD-10-CM | POA: Diagnosis not present

## 2021-09-03 DIAGNOSIS — R2681 Unsteadiness on feet: Secondary | ICD-10-CM | POA: Diagnosis not present

## 2021-09-03 DIAGNOSIS — G35 Multiple sclerosis: Secondary | ICD-10-CM | POA: Diagnosis not present

## 2021-09-03 DIAGNOSIS — I4819 Other persistent atrial fibrillation: Secondary | ICD-10-CM | POA: Diagnosis not present

## 2021-09-03 DIAGNOSIS — R262 Difficulty in walking, not elsewhere classified: Secondary | ICD-10-CM | POA: Diagnosis not present

## 2021-09-03 DIAGNOSIS — R55 Syncope and collapse: Secondary | ICD-10-CM | POA: Diagnosis not present

## 2021-09-03 DIAGNOSIS — M6281 Muscle weakness (generalized): Secondary | ICD-10-CM | POA: Diagnosis not present

## 2021-09-05 DIAGNOSIS — R2681 Unsteadiness on feet: Secondary | ICD-10-CM | POA: Diagnosis not present

## 2021-09-05 DIAGNOSIS — R262 Difficulty in walking, not elsewhere classified: Secondary | ICD-10-CM | POA: Diagnosis not present

## 2021-09-05 DIAGNOSIS — G35 Multiple sclerosis: Secondary | ICD-10-CM | POA: Diagnosis not present

## 2021-09-05 DIAGNOSIS — M6281 Muscle weakness (generalized): Secondary | ICD-10-CM | POA: Diagnosis not present

## 2021-09-05 DIAGNOSIS — R55 Syncope and collapse: Secondary | ICD-10-CM | POA: Diagnosis not present

## 2021-09-05 DIAGNOSIS — I4819 Other persistent atrial fibrillation: Secondary | ICD-10-CM | POA: Diagnosis not present

## 2021-09-06 DIAGNOSIS — I5022 Chronic systolic (congestive) heart failure: Secondary | ICD-10-CM | POA: Diagnosis not present

## 2021-09-06 DIAGNOSIS — R600 Localized edema: Secondary | ICD-10-CM | POA: Diagnosis not present

## 2021-09-06 DIAGNOSIS — H8110 Benign paroxysmal vertigo, unspecified ear: Secondary | ICD-10-CM | POA: Diagnosis not present

## 2021-09-06 DIAGNOSIS — G8929 Other chronic pain: Secondary | ICD-10-CM | POA: Diagnosis not present

## 2021-09-06 DIAGNOSIS — R42 Dizziness and giddiness: Secondary | ICD-10-CM | POA: Diagnosis not present

## 2021-09-06 DIAGNOSIS — R55 Syncope and collapse: Secondary | ICD-10-CM | POA: Diagnosis not present

## 2021-09-06 DIAGNOSIS — I4891 Unspecified atrial fibrillation: Secondary | ICD-10-CM | POA: Diagnosis not present

## 2021-09-06 DIAGNOSIS — K5900 Constipation, unspecified: Secondary | ICD-10-CM | POA: Diagnosis not present

## 2021-09-06 DIAGNOSIS — E559 Vitamin D deficiency, unspecified: Secondary | ICD-10-CM | POA: Diagnosis not present

## 2021-09-07 DIAGNOSIS — G35 Multiple sclerosis: Secondary | ICD-10-CM | POA: Diagnosis not present

## 2021-09-07 DIAGNOSIS — R262 Difficulty in walking, not elsewhere classified: Secondary | ICD-10-CM | POA: Diagnosis not present

## 2021-09-07 DIAGNOSIS — I4819 Other persistent atrial fibrillation: Secondary | ICD-10-CM | POA: Diagnosis not present

## 2021-09-07 DIAGNOSIS — R2681 Unsteadiness on feet: Secondary | ICD-10-CM | POA: Diagnosis not present

## 2021-09-07 DIAGNOSIS — R55 Syncope and collapse: Secondary | ICD-10-CM | POA: Diagnosis not present

## 2021-09-07 DIAGNOSIS — M6281 Muscle weakness (generalized): Secondary | ICD-10-CM | POA: Diagnosis not present

## 2021-09-10 DIAGNOSIS — R55 Syncope and collapse: Secondary | ICD-10-CM | POA: Diagnosis not present

## 2021-09-10 DIAGNOSIS — R2681 Unsteadiness on feet: Secondary | ICD-10-CM | POA: Diagnosis not present

## 2021-09-10 DIAGNOSIS — G35 Multiple sclerosis: Secondary | ICD-10-CM | POA: Diagnosis not present

## 2021-09-10 DIAGNOSIS — M6281 Muscle weakness (generalized): Secondary | ICD-10-CM | POA: Diagnosis not present

## 2021-09-10 DIAGNOSIS — I4819 Other persistent atrial fibrillation: Secondary | ICD-10-CM | POA: Diagnosis not present

## 2021-09-10 DIAGNOSIS — F4320 Adjustment disorder, unspecified: Secondary | ICD-10-CM | POA: Diagnosis not present

## 2021-09-10 DIAGNOSIS — R262 Difficulty in walking, not elsewhere classified: Secondary | ICD-10-CM | POA: Diagnosis not present

## 2021-09-14 DIAGNOSIS — R262 Difficulty in walking, not elsewhere classified: Secondary | ICD-10-CM | POA: Diagnosis not present

## 2021-09-14 DIAGNOSIS — R2681 Unsteadiness on feet: Secondary | ICD-10-CM | POA: Diagnosis not present

## 2021-09-14 DIAGNOSIS — I4819 Other persistent atrial fibrillation: Secondary | ICD-10-CM | POA: Diagnosis not present

## 2021-09-14 DIAGNOSIS — G35 Multiple sclerosis: Secondary | ICD-10-CM | POA: Diagnosis not present

## 2021-09-14 DIAGNOSIS — M6281 Muscle weakness (generalized): Secondary | ICD-10-CM | POA: Diagnosis not present

## 2021-09-14 DIAGNOSIS — R55 Syncope and collapse: Secondary | ICD-10-CM | POA: Diagnosis not present

## 2021-09-17 DIAGNOSIS — M6281 Muscle weakness (generalized): Secondary | ICD-10-CM | POA: Diagnosis not present

## 2021-09-17 DIAGNOSIS — R55 Syncope and collapse: Secondary | ICD-10-CM | POA: Diagnosis not present

## 2021-09-17 DIAGNOSIS — I4819 Other persistent atrial fibrillation: Secondary | ICD-10-CM | POA: Diagnosis not present

## 2021-09-17 DIAGNOSIS — R2681 Unsteadiness on feet: Secondary | ICD-10-CM | POA: Diagnosis not present

## 2021-09-17 DIAGNOSIS — I10 Essential (primary) hypertension: Secondary | ICD-10-CM | POA: Diagnosis not present

## 2021-09-17 DIAGNOSIS — G35 Multiple sclerosis: Secondary | ICD-10-CM | POA: Diagnosis not present

## 2021-09-17 DIAGNOSIS — R262 Difficulty in walking, not elsewhere classified: Secondary | ICD-10-CM | POA: Diagnosis not present

## 2021-09-19 DIAGNOSIS — G35 Multiple sclerosis: Secondary | ICD-10-CM | POA: Diagnosis not present

## 2021-09-19 DIAGNOSIS — I4819 Other persistent atrial fibrillation: Secondary | ICD-10-CM | POA: Diagnosis not present

## 2021-09-19 DIAGNOSIS — R262 Difficulty in walking, not elsewhere classified: Secondary | ICD-10-CM | POA: Diagnosis not present

## 2021-09-19 DIAGNOSIS — R2681 Unsteadiness on feet: Secondary | ICD-10-CM | POA: Diagnosis not present

## 2021-09-19 DIAGNOSIS — R55 Syncope and collapse: Secondary | ICD-10-CM | POA: Diagnosis not present

## 2021-09-19 DIAGNOSIS — M6281 Muscle weakness (generalized): Secondary | ICD-10-CM | POA: Diagnosis not present

## 2021-09-21 DIAGNOSIS — I4819 Other persistent atrial fibrillation: Secondary | ICD-10-CM | POA: Diagnosis not present

## 2021-09-21 DIAGNOSIS — R2681 Unsteadiness on feet: Secondary | ICD-10-CM | POA: Diagnosis not present

## 2021-09-21 DIAGNOSIS — R262 Difficulty in walking, not elsewhere classified: Secondary | ICD-10-CM | POA: Diagnosis not present

## 2021-09-21 DIAGNOSIS — G35 Multiple sclerosis: Secondary | ICD-10-CM | POA: Diagnosis not present

## 2021-09-21 DIAGNOSIS — M6281 Muscle weakness (generalized): Secondary | ICD-10-CM | POA: Diagnosis not present

## 2021-09-21 DIAGNOSIS — R55 Syncope and collapse: Secondary | ICD-10-CM | POA: Diagnosis not present

## 2021-09-24 DIAGNOSIS — R2681 Unsteadiness on feet: Secondary | ICD-10-CM | POA: Diagnosis not present

## 2021-09-24 DIAGNOSIS — G35 Multiple sclerosis: Secondary | ICD-10-CM | POA: Diagnosis not present

## 2021-09-24 DIAGNOSIS — R262 Difficulty in walking, not elsewhere classified: Secondary | ICD-10-CM | POA: Diagnosis not present

## 2021-09-24 DIAGNOSIS — I4819 Other persistent atrial fibrillation: Secondary | ICD-10-CM | POA: Diagnosis not present

## 2021-09-24 DIAGNOSIS — R55 Syncope and collapse: Secondary | ICD-10-CM | POA: Diagnosis not present

## 2021-09-24 DIAGNOSIS — M6281 Muscle weakness (generalized): Secondary | ICD-10-CM | POA: Diagnosis not present

## 2021-09-26 DIAGNOSIS — E559 Vitamin D deficiency, unspecified: Secondary | ICD-10-CM | POA: Diagnosis not present

## 2021-09-26 DIAGNOSIS — E119 Type 2 diabetes mellitus without complications: Secondary | ICD-10-CM | POA: Diagnosis not present

## 2021-09-26 DIAGNOSIS — I160 Hypertensive urgency: Secondary | ICD-10-CM | POA: Diagnosis not present

## 2021-09-26 DIAGNOSIS — F0390 Unspecified dementia without behavioral disturbance: Secondary | ICD-10-CM | POA: Diagnosis not present

## 2021-09-26 DIAGNOSIS — M1990 Unspecified osteoarthritis, unspecified site: Secondary | ICD-10-CM | POA: Diagnosis not present

## 2021-09-26 DIAGNOSIS — E038 Other specified hypothyroidism: Secondary | ICD-10-CM | POA: Diagnosis not present

## 2021-09-26 DIAGNOSIS — E781 Pure hyperglyceridemia: Secondary | ICD-10-CM | POA: Diagnosis not present

## 2021-09-26 DIAGNOSIS — I10 Essential (primary) hypertension: Secondary | ICD-10-CM | POA: Diagnosis not present

## 2021-09-26 DIAGNOSIS — M6281 Muscle weakness (generalized): Secondary | ICD-10-CM | POA: Diagnosis not present

## 2021-09-26 DIAGNOSIS — R2681 Unsteadiness on feet: Secondary | ICD-10-CM | POA: Diagnosis not present

## 2021-09-26 DIAGNOSIS — I5021 Acute systolic (congestive) heart failure: Secondary | ICD-10-CM | POA: Diagnosis not present

## 2021-09-26 DIAGNOSIS — G35 Multiple sclerosis: Secondary | ICD-10-CM | POA: Diagnosis not present

## 2021-09-26 DIAGNOSIS — R262 Difficulty in walking, not elsewhere classified: Secondary | ICD-10-CM | POA: Diagnosis not present

## 2021-09-26 DIAGNOSIS — R55 Syncope and collapse: Secondary | ICD-10-CM | POA: Diagnosis not present

## 2021-09-26 DIAGNOSIS — I4891 Unspecified atrial fibrillation: Secondary | ICD-10-CM | POA: Diagnosis not present

## 2021-09-26 DIAGNOSIS — I5022 Chronic systolic (congestive) heart failure: Secondary | ICD-10-CM | POA: Diagnosis not present

## 2021-09-26 DIAGNOSIS — I4819 Other persistent atrial fibrillation: Secondary | ICD-10-CM | POA: Diagnosis not present

## 2021-09-26 DIAGNOSIS — F4320 Adjustment disorder, unspecified: Secondary | ICD-10-CM | POA: Diagnosis not present

## 2021-09-26 DIAGNOSIS — D518 Other vitamin B12 deficiency anemias: Secondary | ICD-10-CM | POA: Diagnosis not present

## 2021-09-28 DIAGNOSIS — M6281 Muscle weakness (generalized): Secondary | ICD-10-CM | POA: Diagnosis not present

## 2021-09-28 DIAGNOSIS — R55 Syncope and collapse: Secondary | ICD-10-CM | POA: Diagnosis not present

## 2021-09-28 DIAGNOSIS — I4819 Other persistent atrial fibrillation: Secondary | ICD-10-CM | POA: Diagnosis not present

## 2021-09-28 DIAGNOSIS — R2681 Unsteadiness on feet: Secondary | ICD-10-CM | POA: Diagnosis not present

## 2021-09-28 DIAGNOSIS — G35 Multiple sclerosis: Secondary | ICD-10-CM | POA: Diagnosis not present

## 2021-09-28 DIAGNOSIS — R262 Difficulty in walking, not elsewhere classified: Secondary | ICD-10-CM | POA: Diagnosis not present

## 2021-10-01 DIAGNOSIS — R2681 Unsteadiness on feet: Secondary | ICD-10-CM | POA: Diagnosis not present

## 2021-10-01 DIAGNOSIS — R55 Syncope and collapse: Secondary | ICD-10-CM | POA: Diagnosis not present

## 2021-10-01 DIAGNOSIS — G35 Multiple sclerosis: Secondary | ICD-10-CM | POA: Diagnosis not present

## 2021-10-01 DIAGNOSIS — I4819 Other persistent atrial fibrillation: Secondary | ICD-10-CM | POA: Diagnosis not present

## 2021-10-01 DIAGNOSIS — R262 Difficulty in walking, not elsewhere classified: Secondary | ICD-10-CM | POA: Diagnosis not present

## 2021-10-01 DIAGNOSIS — M6281 Muscle weakness (generalized): Secondary | ICD-10-CM | POA: Diagnosis not present

## 2021-10-03 DIAGNOSIS — I4819 Other persistent atrial fibrillation: Secondary | ICD-10-CM | POA: Diagnosis not present

## 2021-10-03 DIAGNOSIS — M6281 Muscle weakness (generalized): Secondary | ICD-10-CM | POA: Diagnosis not present

## 2021-10-03 DIAGNOSIS — G35 Multiple sclerosis: Secondary | ICD-10-CM | POA: Diagnosis not present

## 2021-10-03 DIAGNOSIS — R2681 Unsteadiness on feet: Secondary | ICD-10-CM | POA: Diagnosis not present

## 2021-10-03 DIAGNOSIS — R55 Syncope and collapse: Secondary | ICD-10-CM | POA: Diagnosis not present

## 2021-10-03 DIAGNOSIS — E1159 Type 2 diabetes mellitus with other circulatory complications: Secondary | ICD-10-CM | POA: Diagnosis not present

## 2021-10-03 DIAGNOSIS — R262 Difficulty in walking, not elsewhere classified: Secondary | ICD-10-CM | POA: Diagnosis not present

## 2021-10-10 DIAGNOSIS — R55 Syncope and collapse: Secondary | ICD-10-CM | POA: Diagnosis not present

## 2021-10-10 DIAGNOSIS — R2681 Unsteadiness on feet: Secondary | ICD-10-CM | POA: Diagnosis not present

## 2021-10-10 DIAGNOSIS — E559 Vitamin D deficiency, unspecified: Secondary | ICD-10-CM | POA: Diagnosis not present

## 2021-10-10 DIAGNOSIS — Z79899 Other long term (current) drug therapy: Secondary | ICD-10-CM | POA: Diagnosis not present

## 2021-10-10 DIAGNOSIS — E7849 Other hyperlipidemia: Secondary | ICD-10-CM | POA: Diagnosis not present

## 2021-10-10 DIAGNOSIS — M6281 Muscle weakness (generalized): Secondary | ICD-10-CM | POA: Diagnosis not present

## 2021-10-10 DIAGNOSIS — I4819 Other persistent atrial fibrillation: Secondary | ICD-10-CM | POA: Diagnosis not present

## 2021-10-10 DIAGNOSIS — G35 Multiple sclerosis: Secondary | ICD-10-CM | POA: Diagnosis not present

## 2021-10-10 DIAGNOSIS — R262 Difficulty in walking, not elsewhere classified: Secondary | ICD-10-CM | POA: Diagnosis not present

## 2021-10-10 DIAGNOSIS — E119 Type 2 diabetes mellitus without complications: Secondary | ICD-10-CM | POA: Diagnosis not present

## 2021-10-10 DIAGNOSIS — E038 Other specified hypothyroidism: Secondary | ICD-10-CM | POA: Diagnosis not present

## 2021-10-10 DIAGNOSIS — D518 Other vitamin B12 deficiency anemias: Secondary | ICD-10-CM | POA: Diagnosis not present

## 2021-10-11 DIAGNOSIS — E782 Mixed hyperlipidemia: Secondary | ICD-10-CM | POA: Diagnosis not present

## 2021-10-11 DIAGNOSIS — N183 Chronic kidney disease, stage 3 unspecified: Secondary | ICD-10-CM | POA: Diagnosis not present

## 2021-10-11 DIAGNOSIS — K219 Gastro-esophageal reflux disease without esophagitis: Secondary | ICD-10-CM | POA: Diagnosis not present

## 2021-10-11 DIAGNOSIS — K5900 Constipation, unspecified: Secondary | ICD-10-CM | POA: Diagnosis not present

## 2021-10-11 DIAGNOSIS — R5381 Other malaise: Secondary | ICD-10-CM | POA: Diagnosis not present

## 2021-10-11 DIAGNOSIS — H8110 Benign paroxysmal vertigo, unspecified ear: Secondary | ICD-10-CM | POA: Diagnosis not present

## 2021-10-11 DIAGNOSIS — E785 Hyperlipidemia, unspecified: Secondary | ICD-10-CM | POA: Diagnosis not present

## 2021-10-11 DIAGNOSIS — E781 Pure hyperglyceridemia: Secondary | ICD-10-CM | POA: Diagnosis not present

## 2021-10-11 DIAGNOSIS — R600 Localized edema: Secondary | ICD-10-CM | POA: Diagnosis not present

## 2021-10-11 DIAGNOSIS — N1831 Chronic kidney disease, stage 3a: Secondary | ICD-10-CM | POA: Diagnosis not present

## 2021-10-11 DIAGNOSIS — I129 Hypertensive chronic kidney disease with stage 1 through stage 4 chronic kidney disease, or unspecified chronic kidney disease: Secondary | ICD-10-CM | POA: Diagnosis not present

## 2021-10-11 DIAGNOSIS — I4891 Unspecified atrial fibrillation: Secondary | ICD-10-CM | POA: Diagnosis not present

## 2021-10-12 DIAGNOSIS — M6281 Muscle weakness (generalized): Secondary | ICD-10-CM | POA: Diagnosis not present

## 2021-10-12 DIAGNOSIS — I4819 Other persistent atrial fibrillation: Secondary | ICD-10-CM | POA: Diagnosis not present

## 2021-10-12 DIAGNOSIS — R55 Syncope and collapse: Secondary | ICD-10-CM | POA: Diagnosis not present

## 2021-10-12 DIAGNOSIS — R2681 Unsteadiness on feet: Secondary | ICD-10-CM | POA: Diagnosis not present

## 2021-10-12 DIAGNOSIS — R262 Difficulty in walking, not elsewhere classified: Secondary | ICD-10-CM | POA: Diagnosis not present

## 2021-10-12 DIAGNOSIS — G35 Multiple sclerosis: Secondary | ICD-10-CM | POA: Diagnosis not present

## 2021-10-15 DIAGNOSIS — M6281 Muscle weakness (generalized): Secondary | ICD-10-CM | POA: Diagnosis not present

## 2021-10-15 DIAGNOSIS — R2681 Unsteadiness on feet: Secondary | ICD-10-CM | POA: Diagnosis not present

## 2021-10-15 DIAGNOSIS — I4819 Other persistent atrial fibrillation: Secondary | ICD-10-CM | POA: Diagnosis not present

## 2021-10-15 DIAGNOSIS — R262 Difficulty in walking, not elsewhere classified: Secondary | ICD-10-CM | POA: Diagnosis not present

## 2021-10-15 DIAGNOSIS — G35 Multiple sclerosis: Secondary | ICD-10-CM | POA: Diagnosis not present

## 2021-10-15 DIAGNOSIS — R55 Syncope and collapse: Secondary | ICD-10-CM | POA: Diagnosis not present

## 2021-10-16 NOTE — Progress Notes (Signed)
?  ?Cardiology Office Note ? ? ?Date:  10/17/2021  ? ?ID:  Virginia Young, DOB 1941/02/09, MRN [id number] ? ?PCP:  Bonnita Nasuti, MD  ?Cardiologist:   Minus Breeding, MD ? ?Chief Complaint  ?Patient presents with  ? Atrial Fibrillation  ? ? ? ?  ?History of Present Illness: ?Virginia Young is a 81 y.o. female who presents for follow up of atrial fibrillation and nonischemic cardiomyopathy.  She has a mildly reduced ejection fraction of 40% in 04/2016.  She has had orthostatic hypotension.  She was last in the hospital in March 2022 with orthostatic hypotension.  I reviewed these records for this visit.    She has been treated with midodrine and her Coreg was changed to tid 12.5 instead of 25 mg bid.  She presents for follow up.   ? ?She lives in a retirement community where she walks 3 times a day to go to the dining all.  She walks slowly with her walker.  If she gets lightheaded she sits down.  There is a little confusion and she mention passing out but talking to the facility employee who is with her there is been no recent syncope.  I did see a fall back in December and I reviewed these records from the emergency department.  It was clear that this was a mechanical fall.  She does have a history of orthostasis is above but she has not had any recurrent severe episodes related to this.  She does not feel her atrial fibrillation.  She does not have any palpitations, presyncope or syncope.  He does not have any chest pressure, neck or arm discomfort. ? ? ?Past Medical History:  ?Diagnosis Date  ? Allergy   ? Atrial fibrillation ([place])   ? coumadin  ? Cataract   ? Chest pain 03/2012  ? a. Lex MV 10/13:  EF 64%, dist ant and apical defect sugg of soft tissue atten, no ischemia  ? Chronic systolic heart failure ([place])   ? Depression   ? GERD (gastroesophageal reflux disease)   ? HLD (hyperlipidemia)   ? Mild mitral regurgitation   ? MS (multiple sclerosis) ([place])   ? NICM (nonischemic cardiomyopathy) ([place])   ? a.  neg CLite in 2003;  b. EF 40-45% in past;   c.  Echo 12/11: EF 35-40%, mild MR, mild LAE, mild RAE, small pericardial effusion   ? Osteoarthritis   ? Osteoporosis   ? Stasis ulcer ([place])   ? Vaginal prolapse without uterine prolapse   ? Varicose veins   ? ? ?Past Surgical History:  ?Procedure Laterality Date  ? ANTERIOR AND POSTERIOR VAGINAL REPAIR W/ SACROSPINOUS LIGAMENT SUSPENSION  2016  ? [facility]  ? BLADDER SURGERY    ? CARPAL TUNNEL RELEASE Right 08/22/2017  ? Procedure: CARPAL TUNNEL RELEASE;  Surgeon: [name], MD;  Location: [facility];  Service: Orthopedics;  Laterality: Right;  ? cataract surgery Bilateral   ? EYE SURGERY    ? INGUINAL HERNIA REPAIR    ? right  ? LIPOMA EXCISION    ? h/o removed from upper back x 2  ? REVERSE SHOULDER ARTHROPLASTY Right 08/22/2017  ? Procedure: REVERSE SHOULDER ARTHROPLASTY;  Surgeon: [name], MD;  Location: [facility];  Service: Orthopedics;  Laterality: Right;  ? ? ? ?Current Outpatient Medications  ?Medication Sig Dispense Refill  ? allopurinol (ZYLOPRIM) 100 MG tablet Take 100 mg by mouth daily.    ? divalproex (DEPAKOTE ER) 250 MG 24  hr tablet Take 1 tablet (250 mg total) by mouth daily. 90 tablet 0  ? docusate sodium (COLACE) 100 MG capsule Take 100 mg by mouth daily.    ? DULoxetine (CYMBALTA) 30 MG capsule Take 1 capsule (30 mg total) by mouth daily. 20 capsule 0  ? ELIQUIS 5 MG TABS tablet TAKE 1 TABLET BY MOUTH EVERY 12 HOURS 180 tablet 0  ? fenofibrate (TRICOR) 48 MG tablet Take 48 mg by mouth daily.    ? furosemide (LASIX) 40 MG tablet Take 40 mg by mouth daily.    ? linaclotide (LINZESS) 72 MCG capsule Take 1 capsule (72 mcg total) by mouth daily before breakfast. 90 capsule 1  ? memantine (NAMENDA) 5 MG tablet Take 1 tablet (5 mg total) by mouth 2 (two) times daily. 30 tablet 0  ? metFORMIN (GLUCOPHAGE) 500 MG tablet Take 500 mg by mouth daily.    ? metoprolol tartrate (LOPRESSOR) 25 MG tablet Take 0.5 tablets (12.5 mg total) by mouth 3 (three) times daily. 20  tablet   ? midodrine (PROAMATINE) 2.5 MG tablet Take 1 tablet (2.5 mg total) by mouth 3 (three) times daily with meals. 270 tablet 3  ? Multiple Vitamin (MULTIVITAMIN) tablet Take 1 tablet by mouth daily.    ? pantoprazole (PROTONIX) 40 MG tablet Take 1 tablet (40 mg total) by mouth 2 (two) times daily.    ? potassium chloride SA (KLOR-CON M) 20 MEQ tablet Take 20 mEq by mouth daily.    ? QUEtiapine (SEROQUEL) 50 MG tablet Take 50 mg by mouth 2 (two) times daily.    ? rosuvastatin (CRESTOR) 20 MG tablet Take 20 mg by mouth daily.    ? Cholecalciferol (D3-1000 PO) Take 1,000 Units by mouth daily. (Patient not taking: Reported on 10/17/2021)    ? diclofenac Sodium (VOLTAREN) 1 % GEL Apply 4 g topically 4 (four) times daily. (Patient not taking: Reported on 95/28/4132) 4 g 0  ? folic acid (FOLVITE) 1 MG tablet Take 1 mg by mouth daily. (Patient not taking: Reported on 10/17/2021)    ? hydrOXYzine (ATARAX) 25 MG tablet Take 25 mg by mouth 3 (three) times daily as needed. (Patient not taking: Reported on 10/17/2021)    ? ondansetron (ZOFRAN) 4 MG tablet Take 1 tablet (4 mg total) by mouth every 6 (six) hours. (Patient not taking: Reported on 10/17/2021) 12 tablet 0  ? OXYGEN Inhale into the lungs. 2 liters PRN - anytime when needed ( cardio rx'd)    ? traMADol (ULTRAM) 50 MG tablet Take 50 mg by mouth every 12 (twelve) hours as needed. (Patient not taking: Reported on 04/11/2021)    ? ?No current facility-administered medications for this visit.  ? ? ?Allergies:   Bactrim [sulfamethoxazole-trimethoprim], Lipitor [atorvastatin calcium], and Lisinopril  ? ? ?ROS:  Please see the history of present illness.   Otherwise, review of systems are positive for none.   All other systems are reviewed and negative.  ? ? ?PHYSICAL EXAM: ?VS:  BP 96/60   Pulse 75   Ht '5\' 6"'$  (1.676 m)   Wt 212 lb (96.2 kg)   BMI 34.22 kg/m?  , BMI Body mass index is 34.22 kg/m?. ?GENERAL:  Well appearing ?NECK:  No jugular venous distention, waveform  within normal limits, carotid upstroke brisk and symmetric, no bruits, no thyromegaly ?LUNGS:  Clear to auscultation bilaterally ?CHEST:  Unremarkable ?HEART:  PMI not displaced or sustained,S1 and S2 within normal limits, no S3,  no clicks, no rubs,  no murmurs, irregular ?ABD:  Flat, positive bowel sounds normal in frequency in pitch, no bruits, no rebound, no guarding, no midline pulsatile mass, no hepatomegaly, no splenomegaly ?EXT:  2 plus pulses throughout, no edema, no cyanosis no clubbing ? ? ?EKG:  EKG is  ordered today. ?Atrial fibrillation, rate 75, axis within normal limits, intervals within normal limits, low voltage in the limb in chest leads, poor anterior R wave progression.  Premature ventricular contractions ? ?Recent Labs: ?06/22/2021: BUN 14; Creatinine, Ser 0.87; Hemoglobin 14.6; Platelets 174; Potassium 3.6; Sodium 135  ? ? ?Lipid Panel ?   ?Component Value Date/Time  ? CHOL 152 09/16/2020 0450  ? CHOL 196 05/15/2015 0827  ? TRIG 78 09/16/2020 0450  ? HDL 40 (L) 09/16/2020 0450  ? HDL 73 05/15/2015 0827  ? CHOLHDL 3.8 09/16/2020 0450  ? VLDL 16 09/16/2020 0450  ? [place] 96 09/16/2020 0450  ? [place] 108 (H) 05/15/2015 0827  ? ?  ? ?Wt Readings from Last 3 Encounters:  ?10/17/21 212 lb (96.2 kg)  ?06/22/21 223 lb 1.7 oz (101.2 kg)  ?11/06/20 223 lb (101.2 kg)  ?  ? ?Other studies Reviewed: ?Additional studies/ records that were reviewed today include: Labs ?Review of the above records demonstrates:  Please see elsewhere in the note.   ? ? ?ASSESSMENT AND PLAN: ? ?NICM: Her ejection fraction was normal on the most recent echocardiogram.  Given this no change in therapy. ? ?Atrial fib:    [name] has a CHA2DS2 - VASc score of 4.  She does have chronic atrial fibrillation which tolerates anticoagulation.  She is on the appropriate dose.  She is up-to-date with blood work adding CBC and creatinine in December.  No change in therapy.  I reviewed the labs.  ? ?Orthostatic hypotension: I  reviewed the labs from her most recent emergency room visit.  She has had no syncope or obvious symptomatic orthostasis.   I talked with her and her caregiver about this and she does not move quickly and can

## 2021-10-17 ENCOUNTER — Ambulatory Visit (INDEPENDENT_AMBULATORY_CARE_PROVIDER_SITE_OTHER): Payer: Medicare Other | Admitting: Cardiology

## 2021-10-17 ENCOUNTER — Encounter: Payer: Self-pay | Admitting: Cardiology

## 2021-10-17 VITALS — BP 96/60 | HR 75 | Ht 66.0 in | Wt 212.0 lb

## 2021-10-17 DIAGNOSIS — M6281 Muscle weakness (generalized): Secondary | ICD-10-CM | POA: Diagnosis not present

## 2021-10-17 DIAGNOSIS — I4819 Other persistent atrial fibrillation: Secondary | ICD-10-CM | POA: Diagnosis not present

## 2021-10-17 DIAGNOSIS — I428 Other cardiomyopathies: Secondary | ICD-10-CM | POA: Diagnosis not present

## 2021-10-17 DIAGNOSIS — I4821 Permanent atrial fibrillation: Secondary | ICD-10-CM

## 2021-10-17 DIAGNOSIS — I951 Orthostatic hypotension: Secondary | ICD-10-CM | POA: Diagnosis not present

## 2021-10-17 DIAGNOSIS — R262 Difficulty in walking, not elsewhere classified: Secondary | ICD-10-CM | POA: Diagnosis not present

## 2021-10-17 DIAGNOSIS — R55 Syncope and collapse: Secondary | ICD-10-CM | POA: Diagnosis not present

## 2021-10-17 DIAGNOSIS — G35 Multiple sclerosis: Secondary | ICD-10-CM | POA: Diagnosis not present

## 2021-10-17 DIAGNOSIS — R2681 Unsteadiness on feet: Secondary | ICD-10-CM | POA: Diagnosis not present

## 2021-10-17 NOTE — Patient Instructions (Signed)

## 2021-10-18 DIAGNOSIS — F329 Major depressive disorder, single episode, unspecified: Secondary | ICD-10-CM | POA: Diagnosis not present

## 2021-10-18 DIAGNOSIS — F5102 Adjustment insomnia: Secondary | ICD-10-CM | POA: Diagnosis not present

## 2021-10-18 DIAGNOSIS — R4189 Other symptoms and signs involving cognitive functions and awareness: Secondary | ICD-10-CM | POA: Diagnosis not present

## 2021-10-18 DIAGNOSIS — I10 Essential (primary) hypertension: Secondary | ICD-10-CM | POA: Diagnosis not present

## 2021-10-19 DIAGNOSIS — R2681 Unsteadiness on feet: Secondary | ICD-10-CM | POA: Diagnosis not present

## 2021-10-19 DIAGNOSIS — M6281 Muscle weakness (generalized): Secondary | ICD-10-CM | POA: Diagnosis not present

## 2021-10-19 DIAGNOSIS — I4819 Other persistent atrial fibrillation: Secondary | ICD-10-CM | POA: Diagnosis not present

## 2021-10-19 DIAGNOSIS — R262 Difficulty in walking, not elsewhere classified: Secondary | ICD-10-CM | POA: Diagnosis not present

## 2021-10-19 DIAGNOSIS — R55 Syncope and collapse: Secondary | ICD-10-CM | POA: Diagnosis not present

## 2021-10-19 DIAGNOSIS — G35 Multiple sclerosis: Secondary | ICD-10-CM | POA: Diagnosis not present

## 2021-10-22 DIAGNOSIS — R262 Difficulty in walking, not elsewhere classified: Secondary | ICD-10-CM | POA: Diagnosis not present

## 2021-10-22 DIAGNOSIS — R2681 Unsteadiness on feet: Secondary | ICD-10-CM | POA: Diagnosis not present

## 2021-10-22 DIAGNOSIS — I4819 Other persistent atrial fibrillation: Secondary | ICD-10-CM | POA: Diagnosis not present

## 2021-10-22 DIAGNOSIS — R55 Syncope and collapse: Secondary | ICD-10-CM | POA: Diagnosis not present

## 2021-10-22 DIAGNOSIS — M6281 Muscle weakness (generalized): Secondary | ICD-10-CM | POA: Diagnosis not present

## 2021-10-22 DIAGNOSIS — G35 Multiple sclerosis: Secondary | ICD-10-CM | POA: Diagnosis not present

## 2021-10-24 DIAGNOSIS — R2681 Unsteadiness on feet: Secondary | ICD-10-CM | POA: Diagnosis not present

## 2021-10-24 DIAGNOSIS — M6281 Muscle weakness (generalized): Secondary | ICD-10-CM | POA: Diagnosis not present

## 2021-10-24 DIAGNOSIS — R55 Syncope and collapse: Secondary | ICD-10-CM | POA: Diagnosis not present

## 2021-10-24 DIAGNOSIS — G35 Multiple sclerosis: Secondary | ICD-10-CM | POA: Diagnosis not present

## 2021-10-24 DIAGNOSIS — R262 Difficulty in walking, not elsewhere classified: Secondary | ICD-10-CM | POA: Diagnosis not present

## 2021-10-24 DIAGNOSIS — I4819 Other persistent atrial fibrillation: Secondary | ICD-10-CM | POA: Diagnosis not present

## 2021-10-26 DIAGNOSIS — M6281 Muscle weakness (generalized): Secondary | ICD-10-CM | POA: Diagnosis not present

## 2021-10-26 DIAGNOSIS — R55 Syncope and collapse: Secondary | ICD-10-CM | POA: Diagnosis not present

## 2021-10-26 DIAGNOSIS — I4819 Other persistent atrial fibrillation: Secondary | ICD-10-CM | POA: Diagnosis not present

## 2021-10-26 DIAGNOSIS — G35 Multiple sclerosis: Secondary | ICD-10-CM | POA: Diagnosis not present

## 2021-10-26 DIAGNOSIS — R262 Difficulty in walking, not elsewhere classified: Secondary | ICD-10-CM | POA: Diagnosis not present

## 2021-10-26 DIAGNOSIS — R2681 Unsteadiness on feet: Secondary | ICD-10-CM | POA: Diagnosis not present

## 2021-10-29 DIAGNOSIS — R2681 Unsteadiness on feet: Secondary | ICD-10-CM | POA: Diagnosis not present

## 2021-10-29 DIAGNOSIS — R262 Difficulty in walking, not elsewhere classified: Secondary | ICD-10-CM | POA: Diagnosis not present

## 2021-10-29 DIAGNOSIS — R55 Syncope and collapse: Secondary | ICD-10-CM | POA: Diagnosis not present

## 2021-10-29 DIAGNOSIS — I4819 Other persistent atrial fibrillation: Secondary | ICD-10-CM | POA: Diagnosis not present

## 2021-10-29 DIAGNOSIS — G35 Multiple sclerosis: Secondary | ICD-10-CM | POA: Diagnosis not present

## 2021-10-29 DIAGNOSIS — M6281 Muscle weakness (generalized): Secondary | ICD-10-CM | POA: Diagnosis not present

## 2021-10-31 DIAGNOSIS — R262 Difficulty in walking, not elsewhere classified: Secondary | ICD-10-CM | POA: Diagnosis not present

## 2021-10-31 DIAGNOSIS — M6281 Muscle weakness (generalized): Secondary | ICD-10-CM | POA: Diagnosis not present

## 2021-10-31 DIAGNOSIS — G35 Multiple sclerosis: Secondary | ICD-10-CM | POA: Diagnosis not present

## 2021-10-31 DIAGNOSIS — R2681 Unsteadiness on feet: Secondary | ICD-10-CM | POA: Diagnosis not present

## 2021-10-31 DIAGNOSIS — R55 Syncope and collapse: Secondary | ICD-10-CM | POA: Diagnosis not present

## 2021-10-31 DIAGNOSIS — I4819 Other persistent atrial fibrillation: Secondary | ICD-10-CM | POA: Diagnosis not present

## 2021-11-01 DIAGNOSIS — F329 Major depressive disorder, single episode, unspecified: Secondary | ICD-10-CM | POA: Diagnosis not present

## 2021-11-01 DIAGNOSIS — R4189 Other symptoms and signs involving cognitive functions and awareness: Secondary | ICD-10-CM | POA: Diagnosis not present

## 2021-11-01 DIAGNOSIS — F5102 Adjustment insomnia: Secondary | ICD-10-CM | POA: Diagnosis not present

## 2021-11-02 DIAGNOSIS — I5022 Chronic systolic (congestive) heart failure: Secondary | ICD-10-CM | POA: Diagnosis not present

## 2021-11-02 DIAGNOSIS — F0390 Unspecified dementia without behavioral disturbance: Secondary | ICD-10-CM | POA: Diagnosis not present

## 2021-11-02 DIAGNOSIS — I4891 Unspecified atrial fibrillation: Secondary | ICD-10-CM | POA: Diagnosis not present

## 2021-11-02 DIAGNOSIS — R2681 Unsteadiness on feet: Secondary | ICD-10-CM | POA: Diagnosis not present

## 2021-11-02 DIAGNOSIS — I10 Essential (primary) hypertension: Secondary | ICD-10-CM | POA: Diagnosis not present

## 2021-11-02 DIAGNOSIS — R262 Difficulty in walking, not elsewhere classified: Secondary | ICD-10-CM | POA: Diagnosis not present

## 2021-11-02 DIAGNOSIS — I5021 Acute systolic (congestive) heart failure: Secondary | ICD-10-CM | POA: Diagnosis not present

## 2021-11-02 DIAGNOSIS — E119 Type 2 diabetes mellitus without complications: Secondary | ICD-10-CM | POA: Diagnosis not present

## 2021-11-02 DIAGNOSIS — D518 Other vitamin B12 deficiency anemias: Secondary | ICD-10-CM | POA: Diagnosis not present

## 2021-11-02 DIAGNOSIS — M1990 Unspecified osteoarthritis, unspecified site: Secondary | ICD-10-CM | POA: Diagnosis not present

## 2021-11-02 DIAGNOSIS — I4819 Other persistent atrial fibrillation: Secondary | ICD-10-CM | POA: Diagnosis not present

## 2021-11-02 DIAGNOSIS — E038 Other specified hypothyroidism: Secondary | ICD-10-CM | POA: Diagnosis not present

## 2021-11-02 DIAGNOSIS — E781 Pure hyperglyceridemia: Secondary | ICD-10-CM | POA: Diagnosis not present

## 2021-11-02 DIAGNOSIS — E559 Vitamin D deficiency, unspecified: Secondary | ICD-10-CM | POA: Diagnosis not present

## 2021-11-02 DIAGNOSIS — G35 Multiple sclerosis: Secondary | ICD-10-CM | POA: Diagnosis not present

## 2021-11-02 DIAGNOSIS — I160 Hypertensive urgency: Secondary | ICD-10-CM | POA: Diagnosis not present

## 2021-11-02 DIAGNOSIS — R55 Syncope and collapse: Secondary | ICD-10-CM | POA: Diagnosis not present

## 2021-11-02 DIAGNOSIS — M6281 Muscle weakness (generalized): Secondary | ICD-10-CM | POA: Diagnosis not present

## 2021-11-06 DIAGNOSIS — I129 Hypertensive chronic kidney disease with stage 1 through stage 4 chronic kidney disease, or unspecified chronic kidney disease: Secondary | ICD-10-CM | POA: Diagnosis not present

## 2021-11-06 DIAGNOSIS — E782 Mixed hyperlipidemia: Secondary | ICD-10-CM | POA: Diagnosis not present

## 2021-11-06 DIAGNOSIS — F0390 Unspecified dementia without behavioral disturbance: Secondary | ICD-10-CM | POA: Diagnosis not present

## 2021-11-06 DIAGNOSIS — I4891 Unspecified atrial fibrillation: Secondary | ICD-10-CM | POA: Diagnosis not present

## 2021-11-06 DIAGNOSIS — E785 Hyperlipidemia, unspecified: Secondary | ICD-10-CM | POA: Diagnosis not present

## 2021-11-06 DIAGNOSIS — I5022 Chronic systolic (congestive) heart failure: Secondary | ICD-10-CM | POA: Diagnosis not present

## 2021-11-06 DIAGNOSIS — K219 Gastro-esophageal reflux disease without esophagitis: Secondary | ICD-10-CM | POA: Diagnosis not present

## 2021-11-06 DIAGNOSIS — E559 Vitamin D deficiency, unspecified: Secondary | ICD-10-CM | POA: Diagnosis not present

## 2021-11-06 DIAGNOSIS — I10 Essential (primary) hypertension: Secondary | ICD-10-CM | POA: Diagnosis not present

## 2021-11-07 DIAGNOSIS — R55 Syncope and collapse: Secondary | ICD-10-CM | POA: Diagnosis not present

## 2021-11-07 DIAGNOSIS — M6281 Muscle weakness (generalized): Secondary | ICD-10-CM | POA: Diagnosis not present

## 2021-11-07 DIAGNOSIS — R2681 Unsteadiness on feet: Secondary | ICD-10-CM | POA: Diagnosis not present

## 2021-11-07 DIAGNOSIS — R262 Difficulty in walking, not elsewhere classified: Secondary | ICD-10-CM | POA: Diagnosis not present

## 2021-11-07 DIAGNOSIS — F4320 Adjustment disorder, unspecified: Secondary | ICD-10-CM | POA: Diagnosis not present

## 2021-11-07 DIAGNOSIS — G35 Multiple sclerosis: Secondary | ICD-10-CM | POA: Diagnosis not present

## 2021-11-07 DIAGNOSIS — I4819 Other persistent atrial fibrillation: Secondary | ICD-10-CM | POA: Diagnosis not present

## 2021-11-09 DIAGNOSIS — R55 Syncope and collapse: Secondary | ICD-10-CM | POA: Diagnosis not present

## 2021-11-09 DIAGNOSIS — I4819 Other persistent atrial fibrillation: Secondary | ICD-10-CM | POA: Diagnosis not present

## 2021-11-09 DIAGNOSIS — R262 Difficulty in walking, not elsewhere classified: Secondary | ICD-10-CM | POA: Diagnosis not present

## 2021-11-09 DIAGNOSIS — G35 Multiple sclerosis: Secondary | ICD-10-CM | POA: Diagnosis not present

## 2021-11-09 DIAGNOSIS — M6281 Muscle weakness (generalized): Secondary | ICD-10-CM | POA: Diagnosis not present

## 2021-11-09 DIAGNOSIS — R2681 Unsteadiness on feet: Secondary | ICD-10-CM | POA: Diagnosis not present

## 2021-11-12 DIAGNOSIS — M6281 Muscle weakness (generalized): Secondary | ICD-10-CM | POA: Diagnosis not present

## 2021-11-12 DIAGNOSIS — R55 Syncope and collapse: Secondary | ICD-10-CM | POA: Diagnosis not present

## 2021-11-12 DIAGNOSIS — G35 Multiple sclerosis: Secondary | ICD-10-CM | POA: Diagnosis not present

## 2021-11-12 DIAGNOSIS — R262 Difficulty in walking, not elsewhere classified: Secondary | ICD-10-CM | POA: Diagnosis not present

## 2021-11-12 DIAGNOSIS — I4819 Other persistent atrial fibrillation: Secondary | ICD-10-CM | POA: Diagnosis not present

## 2021-11-12 DIAGNOSIS — R2681 Unsteadiness on feet: Secondary | ICD-10-CM | POA: Diagnosis not present

## 2021-11-14 DIAGNOSIS — R262 Difficulty in walking, not elsewhere classified: Secondary | ICD-10-CM | POA: Diagnosis not present

## 2021-11-14 DIAGNOSIS — R55 Syncope and collapse: Secondary | ICD-10-CM | POA: Diagnosis not present

## 2021-11-14 DIAGNOSIS — G35 Multiple sclerosis: Secondary | ICD-10-CM | POA: Diagnosis not present

## 2021-11-14 DIAGNOSIS — R2681 Unsteadiness on feet: Secondary | ICD-10-CM | POA: Diagnosis not present

## 2021-11-14 DIAGNOSIS — M6281 Muscle weakness (generalized): Secondary | ICD-10-CM | POA: Diagnosis not present

## 2021-11-14 DIAGNOSIS — I4819 Other persistent atrial fibrillation: Secondary | ICD-10-CM | POA: Diagnosis not present

## 2021-11-17 DIAGNOSIS — I10 Essential (primary) hypertension: Secondary | ICD-10-CM | POA: Diagnosis not present

## 2021-11-20 DIAGNOSIS — Z9181 History of falling: Secondary | ICD-10-CM | POA: Diagnosis not present

## 2021-11-20 DIAGNOSIS — M6281 Muscle weakness (generalized): Secondary | ICD-10-CM | POA: Diagnosis not present

## 2021-11-20 DIAGNOSIS — R279 Unspecified lack of coordination: Secondary | ICD-10-CM | POA: Diagnosis not present

## 2021-11-22 DIAGNOSIS — M6281 Muscle weakness (generalized): Secondary | ICD-10-CM | POA: Diagnosis not present

## 2021-11-22 DIAGNOSIS — Z9181 History of falling: Secondary | ICD-10-CM | POA: Diagnosis not present

## 2021-11-22 DIAGNOSIS — R279 Unspecified lack of coordination: Secondary | ICD-10-CM | POA: Diagnosis not present

## 2021-11-23 DIAGNOSIS — F4320 Adjustment disorder, unspecified: Secondary | ICD-10-CM | POA: Diagnosis not present

## 2021-11-27 DIAGNOSIS — M6281 Muscle weakness (generalized): Secondary | ICD-10-CM | POA: Diagnosis not present

## 2021-11-27 DIAGNOSIS — R279 Unspecified lack of coordination: Secondary | ICD-10-CM | POA: Diagnosis not present

## 2021-11-27 DIAGNOSIS — Z9181 History of falling: Secondary | ICD-10-CM | POA: Diagnosis not present

## 2021-11-29 DIAGNOSIS — M6281 Muscle weakness (generalized): Secondary | ICD-10-CM | POA: Diagnosis not present

## 2021-11-29 DIAGNOSIS — R279 Unspecified lack of coordination: Secondary | ICD-10-CM | POA: Diagnosis not present

## 2021-11-29 DIAGNOSIS — Z9181 History of falling: Secondary | ICD-10-CM | POA: Diagnosis not present

## 2021-12-01 DIAGNOSIS — Z9181 History of falling: Secondary | ICD-10-CM | POA: Diagnosis not present

## 2021-12-01 DIAGNOSIS — M6281 Muscle weakness (generalized): Secondary | ICD-10-CM | POA: Diagnosis not present

## 2021-12-01 DIAGNOSIS — R279 Unspecified lack of coordination: Secondary | ICD-10-CM | POA: Diagnosis not present

## 2021-12-04 DIAGNOSIS — R279 Unspecified lack of coordination: Secondary | ICD-10-CM | POA: Diagnosis not present

## 2021-12-04 DIAGNOSIS — G8929 Other chronic pain: Secondary | ICD-10-CM | POA: Diagnosis not present

## 2021-12-04 DIAGNOSIS — E782 Mixed hyperlipidemia: Secondary | ICD-10-CM | POA: Diagnosis not present

## 2021-12-04 DIAGNOSIS — Z9181 History of falling: Secondary | ICD-10-CM | POA: Diagnosis not present

## 2021-12-04 DIAGNOSIS — M6281 Muscle weakness (generalized): Secondary | ICD-10-CM | POA: Diagnosis not present

## 2021-12-04 DIAGNOSIS — I10 Essential (primary) hypertension: Secondary | ICD-10-CM | POA: Diagnosis not present

## 2021-12-04 DIAGNOSIS — K219 Gastro-esophageal reflux disease without esophagitis: Secondary | ICD-10-CM | POA: Diagnosis not present

## 2021-12-04 DIAGNOSIS — M1990 Unspecified osteoarthritis, unspecified site: Secondary | ICD-10-CM | POA: Diagnosis not present

## 2021-12-04 DIAGNOSIS — F0390 Unspecified dementia without behavioral disturbance: Secondary | ICD-10-CM | POA: Diagnosis not present

## 2021-12-04 DIAGNOSIS — H8110 Benign paroxysmal vertigo, unspecified ear: Secondary | ICD-10-CM | POA: Diagnosis not present

## 2021-12-04 DIAGNOSIS — E559 Vitamin D deficiency, unspecified: Secondary | ICD-10-CM | POA: Diagnosis not present

## 2021-12-04 DIAGNOSIS — I5022 Chronic systolic (congestive) heart failure: Secondary | ICD-10-CM | POA: Diagnosis not present

## 2021-12-04 DIAGNOSIS — I4891 Unspecified atrial fibrillation: Secondary | ICD-10-CM | POA: Diagnosis not present

## 2021-12-06 DIAGNOSIS — R279 Unspecified lack of coordination: Secondary | ICD-10-CM | POA: Diagnosis not present

## 2021-12-06 DIAGNOSIS — M6281 Muscle weakness (generalized): Secondary | ICD-10-CM | POA: Diagnosis not present

## 2021-12-06 DIAGNOSIS — Z9181 History of falling: Secondary | ICD-10-CM | POA: Diagnosis not present

## 2021-12-08 DIAGNOSIS — Z9181 History of falling: Secondary | ICD-10-CM | POA: Diagnosis not present

## 2021-12-08 DIAGNOSIS — R279 Unspecified lack of coordination: Secondary | ICD-10-CM | POA: Diagnosis not present

## 2021-12-08 DIAGNOSIS — M6281 Muscle weakness (generalized): Secondary | ICD-10-CM | POA: Diagnosis not present

## 2021-12-11 DIAGNOSIS — R279 Unspecified lack of coordination: Secondary | ICD-10-CM | POA: Diagnosis not present

## 2021-12-11 DIAGNOSIS — Z9181 History of falling: Secondary | ICD-10-CM | POA: Diagnosis not present

## 2021-12-11 DIAGNOSIS — M6281 Muscle weakness (generalized): Secondary | ICD-10-CM | POA: Diagnosis not present

## 2021-12-13 DIAGNOSIS — Z9181 History of falling: Secondary | ICD-10-CM | POA: Diagnosis not present

## 2021-12-13 DIAGNOSIS — M6281 Muscle weakness (generalized): Secondary | ICD-10-CM | POA: Diagnosis not present

## 2021-12-13 DIAGNOSIS — R279 Unspecified lack of coordination: Secondary | ICD-10-CM | POA: Diagnosis not present

## 2021-12-14 DIAGNOSIS — M6281 Muscle weakness (generalized): Secondary | ICD-10-CM | POA: Diagnosis not present

## 2021-12-14 DIAGNOSIS — I4819 Other persistent atrial fibrillation: Secondary | ICD-10-CM | POA: Diagnosis not present

## 2021-12-14 DIAGNOSIS — R2681 Unsteadiness on feet: Secondary | ICD-10-CM | POA: Diagnosis not present

## 2021-12-14 DIAGNOSIS — R262 Difficulty in walking, not elsewhere classified: Secondary | ICD-10-CM | POA: Diagnosis not present

## 2021-12-14 DIAGNOSIS — R55 Syncope and collapse: Secondary | ICD-10-CM | POA: Diagnosis not present

## 2021-12-14 DIAGNOSIS — G35 Multiple sclerosis: Secondary | ICD-10-CM | POA: Diagnosis not present

## 2021-12-15 DIAGNOSIS — M6281 Muscle weakness (generalized): Secondary | ICD-10-CM | POA: Diagnosis not present

## 2021-12-15 DIAGNOSIS — Z9181 History of falling: Secondary | ICD-10-CM | POA: Diagnosis not present

## 2021-12-15 DIAGNOSIS — R279 Unspecified lack of coordination: Secondary | ICD-10-CM | POA: Diagnosis not present

## 2021-12-17 DIAGNOSIS — I4819 Other persistent atrial fibrillation: Secondary | ICD-10-CM | POA: Diagnosis not present

## 2021-12-17 DIAGNOSIS — F0390 Unspecified dementia without behavioral disturbance: Secondary | ICD-10-CM | POA: Diagnosis not present

## 2021-12-17 DIAGNOSIS — E038 Other specified hypothyroidism: Secondary | ICD-10-CM | POA: Diagnosis not present

## 2021-12-17 DIAGNOSIS — I4891 Unspecified atrial fibrillation: Secondary | ICD-10-CM | POA: Diagnosis not present

## 2021-12-17 DIAGNOSIS — R55 Syncope and collapse: Secondary | ICD-10-CM | POA: Diagnosis not present

## 2021-12-17 DIAGNOSIS — I160 Hypertensive urgency: Secondary | ICD-10-CM | POA: Diagnosis not present

## 2021-12-17 DIAGNOSIS — M6281 Muscle weakness (generalized): Secondary | ICD-10-CM | POA: Diagnosis not present

## 2021-12-17 DIAGNOSIS — I10 Essential (primary) hypertension: Secondary | ICD-10-CM | POA: Diagnosis not present

## 2021-12-17 DIAGNOSIS — E119 Type 2 diabetes mellitus without complications: Secondary | ICD-10-CM | POA: Diagnosis not present

## 2021-12-17 DIAGNOSIS — M1990 Unspecified osteoarthritis, unspecified site: Secondary | ICD-10-CM | POA: Diagnosis not present

## 2021-12-17 DIAGNOSIS — R262 Difficulty in walking, not elsewhere classified: Secondary | ICD-10-CM | POA: Diagnosis not present

## 2021-12-17 DIAGNOSIS — E781 Pure hyperglyceridemia: Secondary | ICD-10-CM | POA: Diagnosis not present

## 2021-12-17 DIAGNOSIS — R2681 Unsteadiness on feet: Secondary | ICD-10-CM | POA: Diagnosis not present

## 2021-12-17 DIAGNOSIS — I5021 Acute systolic (congestive) heart failure: Secondary | ICD-10-CM | POA: Diagnosis not present

## 2021-12-17 DIAGNOSIS — D518 Other vitamin B12 deficiency anemias: Secondary | ICD-10-CM | POA: Diagnosis not present

## 2021-12-17 DIAGNOSIS — I5022 Chronic systolic (congestive) heart failure: Secondary | ICD-10-CM | POA: Diagnosis not present

## 2021-12-17 DIAGNOSIS — E559 Vitamin D deficiency, unspecified: Secondary | ICD-10-CM | POA: Diagnosis not present

## 2021-12-17 DIAGNOSIS — G35 Multiple sclerosis: Secondary | ICD-10-CM | POA: Diagnosis not present

## 2021-12-18 DIAGNOSIS — R279 Unspecified lack of coordination: Secondary | ICD-10-CM | POA: Diagnosis not present

## 2021-12-18 DIAGNOSIS — Z9181 History of falling: Secondary | ICD-10-CM | POA: Diagnosis not present

## 2021-12-18 DIAGNOSIS — M6281 Muscle weakness (generalized): Secondary | ICD-10-CM | POA: Diagnosis not present

## 2021-12-18 DIAGNOSIS — I10 Essential (primary) hypertension: Secondary | ICD-10-CM | POA: Diagnosis not present

## 2021-12-19 DIAGNOSIS — R55 Syncope and collapse: Secondary | ICD-10-CM | POA: Diagnosis not present

## 2021-12-19 DIAGNOSIS — M6281 Muscle weakness (generalized): Secondary | ICD-10-CM | POA: Diagnosis not present

## 2021-12-19 DIAGNOSIS — R262 Difficulty in walking, not elsewhere classified: Secondary | ICD-10-CM | POA: Diagnosis not present

## 2021-12-19 DIAGNOSIS — I4819 Other persistent atrial fibrillation: Secondary | ICD-10-CM | POA: Diagnosis not present

## 2021-12-19 DIAGNOSIS — G35 Multiple sclerosis: Secondary | ICD-10-CM | POA: Diagnosis not present

## 2021-12-19 DIAGNOSIS — R2681 Unsteadiness on feet: Secondary | ICD-10-CM | POA: Diagnosis not present

## 2021-12-20 DIAGNOSIS — Z9181 History of falling: Secondary | ICD-10-CM | POA: Diagnosis not present

## 2021-12-20 DIAGNOSIS — M6281 Muscle weakness (generalized): Secondary | ICD-10-CM | POA: Diagnosis not present

## 2021-12-20 DIAGNOSIS — R279 Unspecified lack of coordination: Secondary | ICD-10-CM | POA: Diagnosis not present

## 2021-12-21 DIAGNOSIS — R262 Difficulty in walking, not elsewhere classified: Secondary | ICD-10-CM | POA: Diagnosis not present

## 2021-12-21 DIAGNOSIS — R2681 Unsteadiness on feet: Secondary | ICD-10-CM | POA: Diagnosis not present

## 2021-12-21 DIAGNOSIS — G35 Multiple sclerosis: Secondary | ICD-10-CM | POA: Diagnosis not present

## 2021-12-21 DIAGNOSIS — R55 Syncope and collapse: Secondary | ICD-10-CM | POA: Diagnosis not present

## 2021-12-21 DIAGNOSIS — M6281 Muscle weakness (generalized): Secondary | ICD-10-CM | POA: Diagnosis not present

## 2021-12-21 DIAGNOSIS — I4819 Other persistent atrial fibrillation: Secondary | ICD-10-CM | POA: Diagnosis not present

## 2021-12-22 DIAGNOSIS — M6281 Muscle weakness (generalized): Secondary | ICD-10-CM | POA: Diagnosis not present

## 2021-12-22 DIAGNOSIS — Z9181 History of falling: Secondary | ICD-10-CM | POA: Diagnosis not present

## 2021-12-22 DIAGNOSIS — R279 Unspecified lack of coordination: Secondary | ICD-10-CM | POA: Diagnosis not present

## 2021-12-24 DIAGNOSIS — G35 Multiple sclerosis: Secondary | ICD-10-CM | POA: Diagnosis not present

## 2021-12-24 DIAGNOSIS — I4819 Other persistent atrial fibrillation: Secondary | ICD-10-CM | POA: Diagnosis not present

## 2021-12-24 DIAGNOSIS — R262 Difficulty in walking, not elsewhere classified: Secondary | ICD-10-CM | POA: Diagnosis not present

## 2021-12-24 DIAGNOSIS — R2681 Unsteadiness on feet: Secondary | ICD-10-CM | POA: Diagnosis not present

## 2021-12-24 DIAGNOSIS — R55 Syncope and collapse: Secondary | ICD-10-CM | POA: Diagnosis not present

## 2021-12-24 DIAGNOSIS — M6281 Muscle weakness (generalized): Secondary | ICD-10-CM | POA: Diagnosis not present

## 2021-12-25 DIAGNOSIS — M6281 Muscle weakness (generalized): Secondary | ICD-10-CM | POA: Diagnosis not present

## 2021-12-25 DIAGNOSIS — Z9181 History of falling: Secondary | ICD-10-CM | POA: Diagnosis not present

## 2021-12-25 DIAGNOSIS — R279 Unspecified lack of coordination: Secondary | ICD-10-CM | POA: Diagnosis not present

## 2021-12-26 DIAGNOSIS — R2681 Unsteadiness on feet: Secondary | ICD-10-CM | POA: Diagnosis not present

## 2021-12-26 DIAGNOSIS — R55 Syncope and collapse: Secondary | ICD-10-CM | POA: Diagnosis not present

## 2021-12-26 DIAGNOSIS — G35 Multiple sclerosis: Secondary | ICD-10-CM | POA: Diagnosis not present

## 2021-12-26 DIAGNOSIS — I4819 Other persistent atrial fibrillation: Secondary | ICD-10-CM | POA: Diagnosis not present

## 2021-12-26 DIAGNOSIS — M6281 Muscle weakness (generalized): Secondary | ICD-10-CM | POA: Diagnosis not present

## 2021-12-26 DIAGNOSIS — R262 Difficulty in walking, not elsewhere classified: Secondary | ICD-10-CM | POA: Diagnosis not present

## 2021-12-27 DIAGNOSIS — R279 Unspecified lack of coordination: Secondary | ICD-10-CM | POA: Diagnosis not present

## 2021-12-27 DIAGNOSIS — M6281 Muscle weakness (generalized): Secondary | ICD-10-CM | POA: Diagnosis not present

## 2021-12-27 DIAGNOSIS — Z9181 History of falling: Secondary | ICD-10-CM | POA: Diagnosis not present

## 2021-12-28 DIAGNOSIS — G35 Multiple sclerosis: Secondary | ICD-10-CM | POA: Diagnosis not present

## 2021-12-28 DIAGNOSIS — R55 Syncope and collapse: Secondary | ICD-10-CM | POA: Diagnosis not present

## 2021-12-28 DIAGNOSIS — I4819 Other persistent atrial fibrillation: Secondary | ICD-10-CM | POA: Diagnosis not present

## 2021-12-28 DIAGNOSIS — R2681 Unsteadiness on feet: Secondary | ICD-10-CM | POA: Diagnosis not present

## 2021-12-28 DIAGNOSIS — F4320 Adjustment disorder, unspecified: Secondary | ICD-10-CM | POA: Diagnosis not present

## 2021-12-28 DIAGNOSIS — R262 Difficulty in walking, not elsewhere classified: Secondary | ICD-10-CM | POA: Diagnosis not present

## 2021-12-28 DIAGNOSIS — M6281 Muscle weakness (generalized): Secondary | ICD-10-CM | POA: Diagnosis not present

## 2021-12-29 DIAGNOSIS — M6281 Muscle weakness (generalized): Secondary | ICD-10-CM | POA: Diagnosis not present

## 2021-12-29 DIAGNOSIS — Z9181 History of falling: Secondary | ICD-10-CM | POA: Diagnosis not present

## 2021-12-29 DIAGNOSIS — R279 Unspecified lack of coordination: Secondary | ICD-10-CM | POA: Diagnosis not present

## 2021-12-31 DIAGNOSIS — M6281 Muscle weakness (generalized): Secondary | ICD-10-CM | POA: Diagnosis not present

## 2021-12-31 DIAGNOSIS — R279 Unspecified lack of coordination: Secondary | ICD-10-CM | POA: Diagnosis not present

## 2021-12-31 DIAGNOSIS — Z9181 History of falling: Secondary | ICD-10-CM | POA: Diagnosis not present

## 2022-01-02 DIAGNOSIS — Z9181 History of falling: Secondary | ICD-10-CM | POA: Diagnosis not present

## 2022-01-02 DIAGNOSIS — R55 Syncope and collapse: Secondary | ICD-10-CM | POA: Diagnosis not present

## 2022-01-02 DIAGNOSIS — R2681 Unsteadiness on feet: Secondary | ICD-10-CM | POA: Diagnosis not present

## 2022-01-02 DIAGNOSIS — G35 Multiple sclerosis: Secondary | ICD-10-CM | POA: Diagnosis not present

## 2022-01-02 DIAGNOSIS — I4819 Other persistent atrial fibrillation: Secondary | ICD-10-CM | POA: Diagnosis not present

## 2022-01-02 DIAGNOSIS — R279 Unspecified lack of coordination: Secondary | ICD-10-CM | POA: Diagnosis not present

## 2022-01-02 DIAGNOSIS — R262 Difficulty in walking, not elsewhere classified: Secondary | ICD-10-CM | POA: Diagnosis not present

## 2022-01-02 DIAGNOSIS — M6281 Muscle weakness (generalized): Secondary | ICD-10-CM | POA: Diagnosis not present

## 2022-01-03 DIAGNOSIS — F329 Major depressive disorder, single episode, unspecified: Secondary | ICD-10-CM | POA: Diagnosis not present

## 2022-01-03 DIAGNOSIS — R4189 Other symptoms and signs involving cognitive functions and awareness: Secondary | ICD-10-CM | POA: Diagnosis not present

## 2022-01-03 DIAGNOSIS — F5102 Adjustment insomnia: Secondary | ICD-10-CM | POA: Diagnosis not present

## 2022-01-04 DIAGNOSIS — M1990 Unspecified osteoarthritis, unspecified site: Secondary | ICD-10-CM | POA: Diagnosis not present

## 2022-01-04 DIAGNOSIS — R262 Difficulty in walking, not elsewhere classified: Secondary | ICD-10-CM | POA: Diagnosis not present

## 2022-01-04 DIAGNOSIS — I160 Hypertensive urgency: Secondary | ICD-10-CM | POA: Diagnosis not present

## 2022-01-04 DIAGNOSIS — M6281 Muscle weakness (generalized): Secondary | ICD-10-CM | POA: Diagnosis not present

## 2022-01-04 DIAGNOSIS — G35 Multiple sclerosis: Secondary | ICD-10-CM | POA: Diagnosis not present

## 2022-01-04 DIAGNOSIS — Z9181 History of falling: Secondary | ICD-10-CM | POA: Diagnosis not present

## 2022-01-04 DIAGNOSIS — E781 Pure hyperglyceridemia: Secondary | ICD-10-CM | POA: Diagnosis not present

## 2022-01-04 DIAGNOSIS — D518 Other vitamin B12 deficiency anemias: Secondary | ICD-10-CM | POA: Diagnosis not present

## 2022-01-04 DIAGNOSIS — E559 Vitamin D deficiency, unspecified: Secondary | ICD-10-CM | POA: Diagnosis not present

## 2022-01-04 DIAGNOSIS — E038 Other specified hypothyroidism: Secondary | ICD-10-CM | POA: Diagnosis not present

## 2022-01-04 DIAGNOSIS — I4819 Other persistent atrial fibrillation: Secondary | ICD-10-CM | POA: Diagnosis not present

## 2022-01-04 DIAGNOSIS — R2681 Unsteadiness on feet: Secondary | ICD-10-CM | POA: Diagnosis not present

## 2022-01-04 DIAGNOSIS — I5021 Acute systolic (congestive) heart failure: Secondary | ICD-10-CM | POA: Diagnosis not present

## 2022-01-04 DIAGNOSIS — R279 Unspecified lack of coordination: Secondary | ICD-10-CM | POA: Diagnosis not present

## 2022-01-04 DIAGNOSIS — I4891 Unspecified atrial fibrillation: Secondary | ICD-10-CM | POA: Diagnosis not present

## 2022-01-04 DIAGNOSIS — I10 Essential (primary) hypertension: Secondary | ICD-10-CM | POA: Diagnosis not present

## 2022-01-04 DIAGNOSIS — F0390 Unspecified dementia without behavioral disturbance: Secondary | ICD-10-CM | POA: Diagnosis not present

## 2022-01-04 DIAGNOSIS — I5022 Chronic systolic (congestive) heart failure: Secondary | ICD-10-CM | POA: Diagnosis not present

## 2022-01-04 DIAGNOSIS — E119 Type 2 diabetes mellitus without complications: Secondary | ICD-10-CM | POA: Diagnosis not present

## 2022-01-04 DIAGNOSIS — R55 Syncope and collapse: Secondary | ICD-10-CM | POA: Diagnosis not present

## 2022-01-07 DIAGNOSIS — Z9181 History of falling: Secondary | ICD-10-CM | POA: Diagnosis not present

## 2022-01-07 DIAGNOSIS — M6281 Muscle weakness (generalized): Secondary | ICD-10-CM | POA: Diagnosis not present

## 2022-01-07 DIAGNOSIS — R2681 Unsteadiness on feet: Secondary | ICD-10-CM | POA: Diagnosis not present

## 2022-01-07 DIAGNOSIS — R279 Unspecified lack of coordination: Secondary | ICD-10-CM | POA: Diagnosis not present

## 2022-01-07 DIAGNOSIS — G35 Multiple sclerosis: Secondary | ICD-10-CM | POA: Diagnosis not present

## 2022-01-07 DIAGNOSIS — R262 Difficulty in walking, not elsewhere classified: Secondary | ICD-10-CM | POA: Diagnosis not present

## 2022-01-07 DIAGNOSIS — I4819 Other persistent atrial fibrillation: Secondary | ICD-10-CM | POA: Diagnosis not present

## 2022-01-07 DIAGNOSIS — R55 Syncope and collapse: Secondary | ICD-10-CM | POA: Diagnosis not present

## 2022-01-07 DIAGNOSIS — F4320 Adjustment disorder, unspecified: Secondary | ICD-10-CM | POA: Diagnosis not present

## 2022-01-08 DIAGNOSIS — R300 Dysuria: Secondary | ICD-10-CM | POA: Diagnosis not present

## 2022-01-08 DIAGNOSIS — I10 Essential (primary) hypertension: Secondary | ICD-10-CM | POA: Diagnosis not present

## 2022-01-08 DIAGNOSIS — I4891 Unspecified atrial fibrillation: Secondary | ICD-10-CM | POA: Diagnosis not present

## 2022-01-08 DIAGNOSIS — F0390 Unspecified dementia without behavioral disturbance: Secondary | ICD-10-CM | POA: Diagnosis not present

## 2022-01-08 DIAGNOSIS — I5022 Chronic systolic (congestive) heart failure: Secondary | ICD-10-CM | POA: Diagnosis not present

## 2022-01-08 DIAGNOSIS — I739 Peripheral vascular disease, unspecified: Secondary | ICD-10-CM | POA: Diagnosis not present

## 2022-01-08 DIAGNOSIS — H8110 Benign paroxysmal vertigo, unspecified ear: Secondary | ICD-10-CM | POA: Diagnosis not present

## 2022-01-08 DIAGNOSIS — E559 Vitamin D deficiency, unspecified: Secondary | ICD-10-CM | POA: Diagnosis not present

## 2022-01-09 DIAGNOSIS — R55 Syncope and collapse: Secondary | ICD-10-CM | POA: Diagnosis not present

## 2022-01-09 DIAGNOSIS — R300 Dysuria: Secondary | ICD-10-CM | POA: Diagnosis not present

## 2022-01-09 DIAGNOSIS — R279 Unspecified lack of coordination: Secondary | ICD-10-CM | POA: Diagnosis not present

## 2022-01-09 DIAGNOSIS — I4819 Other persistent atrial fibrillation: Secondary | ICD-10-CM | POA: Diagnosis not present

## 2022-01-09 DIAGNOSIS — M6281 Muscle weakness (generalized): Secondary | ICD-10-CM | POA: Diagnosis not present

## 2022-01-09 DIAGNOSIS — R262 Difficulty in walking, not elsewhere classified: Secondary | ICD-10-CM | POA: Diagnosis not present

## 2022-01-09 DIAGNOSIS — Z9181 History of falling: Secondary | ICD-10-CM | POA: Diagnosis not present

## 2022-01-09 DIAGNOSIS — R2681 Unsteadiness on feet: Secondary | ICD-10-CM | POA: Diagnosis not present

## 2022-01-09 DIAGNOSIS — G35 Multiple sclerosis: Secondary | ICD-10-CM | POA: Diagnosis not present

## 2022-01-11 DIAGNOSIS — R262 Difficulty in walking, not elsewhere classified: Secondary | ICD-10-CM | POA: Diagnosis not present

## 2022-01-11 DIAGNOSIS — M6281 Muscle weakness (generalized): Secondary | ICD-10-CM | POA: Diagnosis not present

## 2022-01-11 DIAGNOSIS — I4819 Other persistent atrial fibrillation: Secondary | ICD-10-CM | POA: Diagnosis not present

## 2022-01-11 DIAGNOSIS — R55 Syncope and collapse: Secondary | ICD-10-CM | POA: Diagnosis not present

## 2022-01-11 DIAGNOSIS — R279 Unspecified lack of coordination: Secondary | ICD-10-CM | POA: Diagnosis not present

## 2022-01-11 DIAGNOSIS — R2681 Unsteadiness on feet: Secondary | ICD-10-CM | POA: Diagnosis not present

## 2022-01-11 DIAGNOSIS — Z9181 History of falling: Secondary | ICD-10-CM | POA: Diagnosis not present

## 2022-01-11 DIAGNOSIS — G35 Multiple sclerosis: Secondary | ICD-10-CM | POA: Diagnosis not present

## 2022-01-14 DIAGNOSIS — N390 Urinary tract infection, site not specified: Secondary | ICD-10-CM | POA: Diagnosis not present

## 2022-01-14 DIAGNOSIS — R2681 Unsteadiness on feet: Secondary | ICD-10-CM | POA: Diagnosis not present

## 2022-01-14 DIAGNOSIS — R262 Difficulty in walking, not elsewhere classified: Secondary | ICD-10-CM | POA: Diagnosis not present

## 2022-01-14 DIAGNOSIS — M6281 Muscle weakness (generalized): Secondary | ICD-10-CM | POA: Diagnosis not present

## 2022-01-14 DIAGNOSIS — I4819 Other persistent atrial fibrillation: Secondary | ICD-10-CM | POA: Diagnosis not present

## 2022-01-14 DIAGNOSIS — G35 Multiple sclerosis: Secondary | ICD-10-CM | POA: Diagnosis not present

## 2022-01-14 DIAGNOSIS — R55 Syncope and collapse: Secondary | ICD-10-CM | POA: Diagnosis not present

## 2022-01-15 DIAGNOSIS — R279 Unspecified lack of coordination: Secondary | ICD-10-CM | POA: Diagnosis not present

## 2022-01-15 DIAGNOSIS — Z9181 History of falling: Secondary | ICD-10-CM | POA: Diagnosis not present

## 2022-01-15 DIAGNOSIS — M6281 Muscle weakness (generalized): Secondary | ICD-10-CM | POA: Diagnosis not present

## 2022-01-16 DIAGNOSIS — R55 Syncope and collapse: Secondary | ICD-10-CM | POA: Diagnosis not present

## 2022-01-16 DIAGNOSIS — R2681 Unsteadiness on feet: Secondary | ICD-10-CM | POA: Diagnosis not present

## 2022-01-16 DIAGNOSIS — R262 Difficulty in walking, not elsewhere classified: Secondary | ICD-10-CM | POA: Diagnosis not present

## 2022-01-16 DIAGNOSIS — M6281 Muscle weakness (generalized): Secondary | ICD-10-CM | POA: Diagnosis not present

## 2022-01-16 DIAGNOSIS — G35 Multiple sclerosis: Secondary | ICD-10-CM | POA: Diagnosis not present

## 2022-01-16 DIAGNOSIS — I4819 Other persistent atrial fibrillation: Secondary | ICD-10-CM | POA: Diagnosis not present

## 2022-01-17 DIAGNOSIS — R279 Unspecified lack of coordination: Secondary | ICD-10-CM | POA: Diagnosis not present

## 2022-01-17 DIAGNOSIS — M6281 Muscle weakness (generalized): Secondary | ICD-10-CM | POA: Diagnosis not present

## 2022-01-17 DIAGNOSIS — Z9181 History of falling: Secondary | ICD-10-CM | POA: Diagnosis not present

## 2022-01-17 DIAGNOSIS — I10 Essential (primary) hypertension: Secondary | ICD-10-CM | POA: Diagnosis not present

## 2022-01-18 DIAGNOSIS — M6281 Muscle weakness (generalized): Secondary | ICD-10-CM | POA: Diagnosis not present

## 2022-01-18 DIAGNOSIS — R55 Syncope and collapse: Secondary | ICD-10-CM | POA: Diagnosis not present

## 2022-01-18 DIAGNOSIS — G35 Multiple sclerosis: Secondary | ICD-10-CM | POA: Diagnosis not present

## 2022-01-18 DIAGNOSIS — R262 Difficulty in walking, not elsewhere classified: Secondary | ICD-10-CM | POA: Diagnosis not present

## 2022-01-18 DIAGNOSIS — I4819 Other persistent atrial fibrillation: Secondary | ICD-10-CM | POA: Diagnosis not present

## 2022-01-18 DIAGNOSIS — R2681 Unsteadiness on feet: Secondary | ICD-10-CM | POA: Diagnosis not present

## 2022-01-21 DIAGNOSIS — G35 Multiple sclerosis: Secondary | ICD-10-CM | POA: Diagnosis not present

## 2022-01-21 DIAGNOSIS — R2681 Unsteadiness on feet: Secondary | ICD-10-CM | POA: Diagnosis not present

## 2022-01-21 DIAGNOSIS — M6281 Muscle weakness (generalized): Secondary | ICD-10-CM | POA: Diagnosis not present

## 2022-01-21 DIAGNOSIS — R55 Syncope and collapse: Secondary | ICD-10-CM | POA: Diagnosis not present

## 2022-01-21 DIAGNOSIS — I4819 Other persistent atrial fibrillation: Secondary | ICD-10-CM | POA: Diagnosis not present

## 2022-01-21 DIAGNOSIS — R262 Difficulty in walking, not elsewhere classified: Secondary | ICD-10-CM | POA: Diagnosis not present

## 2022-01-22 DIAGNOSIS — R279 Unspecified lack of coordination: Secondary | ICD-10-CM | POA: Diagnosis not present

## 2022-01-22 DIAGNOSIS — M6281 Muscle weakness (generalized): Secondary | ICD-10-CM | POA: Diagnosis not present

## 2022-01-22 DIAGNOSIS — R011 Cardiac murmur, unspecified: Secondary | ICD-10-CM | POA: Diagnosis not present

## 2022-01-22 DIAGNOSIS — Z9181 History of falling: Secondary | ICD-10-CM | POA: Diagnosis not present

## 2022-01-24 DIAGNOSIS — F4320 Adjustment disorder, unspecified: Secondary | ICD-10-CM | POA: Diagnosis not present

## 2022-01-24 DIAGNOSIS — M6281 Muscle weakness (generalized): Secondary | ICD-10-CM | POA: Diagnosis not present

## 2022-01-24 DIAGNOSIS — R279 Unspecified lack of coordination: Secondary | ICD-10-CM | POA: Diagnosis not present

## 2022-01-24 DIAGNOSIS — Z9181 History of falling: Secondary | ICD-10-CM | POA: Diagnosis not present

## 2022-01-25 DIAGNOSIS — I4819 Other persistent atrial fibrillation: Secondary | ICD-10-CM | POA: Diagnosis not present

## 2022-01-25 DIAGNOSIS — R262 Difficulty in walking, not elsewhere classified: Secondary | ICD-10-CM | POA: Diagnosis not present

## 2022-01-25 DIAGNOSIS — G35 Multiple sclerosis: Secondary | ICD-10-CM | POA: Diagnosis not present

## 2022-01-25 DIAGNOSIS — R2681 Unsteadiness on feet: Secondary | ICD-10-CM | POA: Diagnosis not present

## 2022-01-25 DIAGNOSIS — R55 Syncope and collapse: Secondary | ICD-10-CM | POA: Diagnosis not present

## 2022-01-25 DIAGNOSIS — M6281 Muscle weakness (generalized): Secondary | ICD-10-CM | POA: Diagnosis not present

## 2022-01-26 DIAGNOSIS — Z9181 History of falling: Secondary | ICD-10-CM | POA: Diagnosis not present

## 2022-01-26 DIAGNOSIS — R279 Unspecified lack of coordination: Secondary | ICD-10-CM | POA: Diagnosis not present

## 2022-01-26 DIAGNOSIS — M6281 Muscle weakness (generalized): Secondary | ICD-10-CM | POA: Diagnosis not present

## 2022-01-28 DIAGNOSIS — R55 Syncope and collapse: Secondary | ICD-10-CM | POA: Diagnosis not present

## 2022-01-28 DIAGNOSIS — R2681 Unsteadiness on feet: Secondary | ICD-10-CM | POA: Diagnosis not present

## 2022-01-28 DIAGNOSIS — R262 Difficulty in walking, not elsewhere classified: Secondary | ICD-10-CM | POA: Diagnosis not present

## 2022-01-28 DIAGNOSIS — I4819 Other persistent atrial fibrillation: Secondary | ICD-10-CM | POA: Diagnosis not present

## 2022-01-28 DIAGNOSIS — G35 Multiple sclerosis: Secondary | ICD-10-CM | POA: Diagnosis not present

## 2022-01-28 DIAGNOSIS — M6281 Muscle weakness (generalized): Secondary | ICD-10-CM | POA: Diagnosis not present

## 2022-01-29 DIAGNOSIS — R0989 Other specified symptoms and signs involving the circulatory and respiratory systems: Secondary | ICD-10-CM | POA: Diagnosis not present

## 2022-01-29 DIAGNOSIS — M6281 Muscle weakness (generalized): Secondary | ICD-10-CM | POA: Diagnosis not present

## 2022-01-29 DIAGNOSIS — Z9181 History of falling: Secondary | ICD-10-CM | POA: Diagnosis not present

## 2022-01-29 DIAGNOSIS — I6523 Occlusion and stenosis of bilateral carotid arteries: Secondary | ICD-10-CM | POA: Diagnosis not present

## 2022-01-29 DIAGNOSIS — R279 Unspecified lack of coordination: Secondary | ICD-10-CM | POA: Diagnosis not present

## 2022-01-30 DIAGNOSIS — D518 Other vitamin B12 deficiency anemias: Secondary | ICD-10-CM | POA: Diagnosis not present

## 2022-01-30 DIAGNOSIS — I160 Hypertensive urgency: Secondary | ICD-10-CM | POA: Diagnosis not present

## 2022-01-30 DIAGNOSIS — I5021 Acute systolic (congestive) heart failure: Secondary | ICD-10-CM | POA: Diagnosis not present

## 2022-01-30 DIAGNOSIS — M6281 Muscle weakness (generalized): Secondary | ICD-10-CM | POA: Diagnosis not present

## 2022-01-30 DIAGNOSIS — I5022 Chronic systolic (congestive) heart failure: Secondary | ICD-10-CM | POA: Diagnosis not present

## 2022-01-30 DIAGNOSIS — F0390 Unspecified dementia without behavioral disturbance: Secondary | ICD-10-CM | POA: Diagnosis not present

## 2022-01-30 DIAGNOSIS — E038 Other specified hypothyroidism: Secondary | ICD-10-CM | POA: Diagnosis not present

## 2022-01-30 DIAGNOSIS — I4891 Unspecified atrial fibrillation: Secondary | ICD-10-CM | POA: Diagnosis not present

## 2022-01-30 DIAGNOSIS — E559 Vitamin D deficiency, unspecified: Secondary | ICD-10-CM | POA: Diagnosis not present

## 2022-01-30 DIAGNOSIS — M1990 Unspecified osteoarthritis, unspecified site: Secondary | ICD-10-CM | POA: Diagnosis not present

## 2022-01-30 DIAGNOSIS — E119 Type 2 diabetes mellitus without complications: Secondary | ICD-10-CM | POA: Diagnosis not present

## 2022-01-30 DIAGNOSIS — E781 Pure hyperglyceridemia: Secondary | ICD-10-CM | POA: Diagnosis not present

## 2022-01-30 DIAGNOSIS — R2681 Unsteadiness on feet: Secondary | ICD-10-CM | POA: Diagnosis not present

## 2022-01-30 DIAGNOSIS — I10 Essential (primary) hypertension: Secondary | ICD-10-CM | POA: Diagnosis not present

## 2022-01-30 DIAGNOSIS — I4819 Other persistent atrial fibrillation: Secondary | ICD-10-CM | POA: Diagnosis not present

## 2022-01-30 DIAGNOSIS — R262 Difficulty in walking, not elsewhere classified: Secondary | ICD-10-CM | POA: Diagnosis not present

## 2022-01-30 DIAGNOSIS — G35 Multiple sclerosis: Secondary | ICD-10-CM | POA: Diagnosis not present

## 2022-01-30 DIAGNOSIS — R55 Syncope and collapse: Secondary | ICD-10-CM | POA: Diagnosis not present

## 2022-01-31 DIAGNOSIS — M6281 Muscle weakness (generalized): Secondary | ICD-10-CM | POA: Diagnosis not present

## 2022-01-31 DIAGNOSIS — R279 Unspecified lack of coordination: Secondary | ICD-10-CM | POA: Diagnosis not present

## 2022-01-31 DIAGNOSIS — Z9181 History of falling: Secondary | ICD-10-CM | POA: Diagnosis not present

## 2022-02-01 DIAGNOSIS — R55 Syncope and collapse: Secondary | ICD-10-CM | POA: Diagnosis not present

## 2022-02-01 DIAGNOSIS — R262 Difficulty in walking, not elsewhere classified: Secondary | ICD-10-CM | POA: Diagnosis not present

## 2022-02-01 DIAGNOSIS — G35 Multiple sclerosis: Secondary | ICD-10-CM | POA: Diagnosis not present

## 2022-02-01 DIAGNOSIS — I4819 Other persistent atrial fibrillation: Secondary | ICD-10-CM | POA: Diagnosis not present

## 2022-02-01 DIAGNOSIS — M6281 Muscle weakness (generalized): Secondary | ICD-10-CM | POA: Diagnosis not present

## 2022-02-01 DIAGNOSIS — R2681 Unsteadiness on feet: Secondary | ICD-10-CM | POA: Diagnosis not present

## 2022-02-04 DIAGNOSIS — G35 Multiple sclerosis: Secondary | ICD-10-CM | POA: Diagnosis not present

## 2022-02-04 DIAGNOSIS — R55 Syncope and collapse: Secondary | ICD-10-CM | POA: Diagnosis not present

## 2022-02-04 DIAGNOSIS — R262 Difficulty in walking, not elsewhere classified: Secondary | ICD-10-CM | POA: Diagnosis not present

## 2022-02-04 DIAGNOSIS — R2681 Unsteadiness on feet: Secondary | ICD-10-CM | POA: Diagnosis not present

## 2022-02-04 DIAGNOSIS — M6281 Muscle weakness (generalized): Secondary | ICD-10-CM | POA: Diagnosis not present

## 2022-02-04 DIAGNOSIS — I4819 Other persistent atrial fibrillation: Secondary | ICD-10-CM | POA: Diagnosis not present

## 2022-02-06 DIAGNOSIS — R2681 Unsteadiness on feet: Secondary | ICD-10-CM | POA: Diagnosis not present

## 2022-02-06 DIAGNOSIS — I77811 Abdominal aortic ectasia: Secondary | ICD-10-CM | POA: Diagnosis not present

## 2022-02-06 DIAGNOSIS — R55 Syncope and collapse: Secondary | ICD-10-CM | POA: Diagnosis not present

## 2022-02-06 DIAGNOSIS — M6281 Muscle weakness (generalized): Secondary | ICD-10-CM | POA: Diagnosis not present

## 2022-02-06 DIAGNOSIS — I4819 Other persistent atrial fibrillation: Secondary | ICD-10-CM | POA: Diagnosis not present

## 2022-02-06 DIAGNOSIS — G35 Multiple sclerosis: Secondary | ICD-10-CM | POA: Diagnosis not present

## 2022-02-06 DIAGNOSIS — R262 Difficulty in walking, not elsewhere classified: Secondary | ICD-10-CM | POA: Diagnosis not present

## 2022-02-07 DIAGNOSIS — R279 Unspecified lack of coordination: Secondary | ICD-10-CM | POA: Diagnosis not present

## 2022-02-07 DIAGNOSIS — Z9181 History of falling: Secondary | ICD-10-CM | POA: Diagnosis not present

## 2022-02-07 DIAGNOSIS — M6281 Muscle weakness (generalized): Secondary | ICD-10-CM | POA: Diagnosis not present

## 2022-02-08 DIAGNOSIS — I4819 Other persistent atrial fibrillation: Secondary | ICD-10-CM | POA: Diagnosis not present

## 2022-02-08 DIAGNOSIS — R2681 Unsteadiness on feet: Secondary | ICD-10-CM | POA: Diagnosis not present

## 2022-02-08 DIAGNOSIS — M6281 Muscle weakness (generalized): Secondary | ICD-10-CM | POA: Diagnosis not present

## 2022-02-08 DIAGNOSIS — R55 Syncope and collapse: Secondary | ICD-10-CM | POA: Diagnosis not present

## 2022-02-08 DIAGNOSIS — G35 Multiple sclerosis: Secondary | ICD-10-CM | POA: Diagnosis not present

## 2022-02-08 DIAGNOSIS — R262 Difficulty in walking, not elsewhere classified: Secondary | ICD-10-CM | POA: Diagnosis not present

## 2022-02-09 DIAGNOSIS — Z9181 History of falling: Secondary | ICD-10-CM | POA: Diagnosis not present

## 2022-02-09 DIAGNOSIS — R279 Unspecified lack of coordination: Secondary | ICD-10-CM | POA: Diagnosis not present

## 2022-02-09 DIAGNOSIS — M6281 Muscle weakness (generalized): Secondary | ICD-10-CM | POA: Diagnosis not present

## 2022-02-11 DIAGNOSIS — R2681 Unsteadiness on feet: Secondary | ICD-10-CM | POA: Diagnosis not present

## 2022-02-11 DIAGNOSIS — G35 Multiple sclerosis: Secondary | ICD-10-CM | POA: Diagnosis not present

## 2022-02-11 DIAGNOSIS — M6281 Muscle weakness (generalized): Secondary | ICD-10-CM | POA: Diagnosis not present

## 2022-02-11 DIAGNOSIS — E042 Nontoxic multinodular goiter: Secondary | ICD-10-CM | POA: Diagnosis not present

## 2022-02-11 DIAGNOSIS — I4819 Other persistent atrial fibrillation: Secondary | ICD-10-CM | POA: Diagnosis not present

## 2022-02-11 DIAGNOSIS — R55 Syncope and collapse: Secondary | ICD-10-CM | POA: Diagnosis not present

## 2022-02-11 DIAGNOSIS — R262 Difficulty in walking, not elsewhere classified: Secondary | ICD-10-CM | POA: Diagnosis not present

## 2022-02-11 DIAGNOSIS — R221 Localized swelling, mass and lump, neck: Secondary | ICD-10-CM | POA: Diagnosis not present

## 2022-02-12 DIAGNOSIS — F0390 Unspecified dementia without behavioral disturbance: Secondary | ICD-10-CM | POA: Diagnosis not present

## 2022-02-12 DIAGNOSIS — E559 Vitamin D deficiency, unspecified: Secondary | ICD-10-CM | POA: Diagnosis not present

## 2022-02-12 DIAGNOSIS — I4891 Unspecified atrial fibrillation: Secondary | ICD-10-CM | POA: Diagnosis not present

## 2022-02-12 DIAGNOSIS — M6281 Muscle weakness (generalized): Secondary | ICD-10-CM | POA: Diagnosis not present

## 2022-02-12 DIAGNOSIS — R279 Unspecified lack of coordination: Secondary | ICD-10-CM | POA: Diagnosis not present

## 2022-02-12 DIAGNOSIS — M1990 Unspecified osteoarthritis, unspecified site: Secondary | ICD-10-CM | POA: Diagnosis not present

## 2022-02-12 DIAGNOSIS — E782 Mixed hyperlipidemia: Secondary | ICD-10-CM | POA: Diagnosis not present

## 2022-02-12 DIAGNOSIS — Z9181 History of falling: Secondary | ICD-10-CM | POA: Diagnosis not present

## 2022-02-12 DIAGNOSIS — I5022 Chronic systolic (congestive) heart failure: Secondary | ICD-10-CM | POA: Diagnosis not present

## 2022-02-12 DIAGNOSIS — I739 Peripheral vascular disease, unspecified: Secondary | ICD-10-CM | POA: Diagnosis not present

## 2022-02-14 DIAGNOSIS — Z9181 History of falling: Secondary | ICD-10-CM | POA: Diagnosis not present

## 2022-02-14 DIAGNOSIS — R279 Unspecified lack of coordination: Secondary | ICD-10-CM | POA: Diagnosis not present

## 2022-02-14 DIAGNOSIS — M6281 Muscle weakness (generalized): Secondary | ICD-10-CM | POA: Diagnosis not present

## 2022-02-16 DIAGNOSIS — R279 Unspecified lack of coordination: Secondary | ICD-10-CM | POA: Diagnosis not present

## 2022-02-16 DIAGNOSIS — Z9181 History of falling: Secondary | ICD-10-CM | POA: Diagnosis not present

## 2022-02-16 DIAGNOSIS — M6281 Muscle weakness (generalized): Secondary | ICD-10-CM | POA: Diagnosis not present

## 2022-02-17 DIAGNOSIS — I10 Essential (primary) hypertension: Secondary | ICD-10-CM | POA: Diagnosis not present

## 2022-02-19 DIAGNOSIS — R2241 Localized swelling, mass and lump, right lower limb: Secondary | ICD-10-CM | POA: Diagnosis not present

## 2022-02-20 DIAGNOSIS — R262 Difficulty in walking, not elsewhere classified: Secondary | ICD-10-CM | POA: Diagnosis not present

## 2022-02-20 DIAGNOSIS — R2681 Unsteadiness on feet: Secondary | ICD-10-CM | POA: Diagnosis not present

## 2022-02-20 DIAGNOSIS — R55 Syncope and collapse: Secondary | ICD-10-CM | POA: Diagnosis not present

## 2022-02-20 DIAGNOSIS — I4819 Other persistent atrial fibrillation: Secondary | ICD-10-CM | POA: Diagnosis not present

## 2022-02-20 DIAGNOSIS — M6281 Muscle weakness (generalized): Secondary | ICD-10-CM | POA: Diagnosis not present

## 2022-02-20 DIAGNOSIS — G35 Multiple sclerosis: Secondary | ICD-10-CM | POA: Diagnosis not present

## 2022-02-21 DIAGNOSIS — M6281 Muscle weakness (generalized): Secondary | ICD-10-CM | POA: Diagnosis not present

## 2022-02-21 DIAGNOSIS — R279 Unspecified lack of coordination: Secondary | ICD-10-CM | POA: Diagnosis not present

## 2022-02-21 DIAGNOSIS — Z9181 History of falling: Secondary | ICD-10-CM | POA: Diagnosis not present

## 2022-02-21 DIAGNOSIS — I70223 Atherosclerosis of native arteries of extremities with rest pain, bilateral legs: Secondary | ICD-10-CM | POA: Diagnosis not present

## 2022-02-23 DIAGNOSIS — R279 Unspecified lack of coordination: Secondary | ICD-10-CM | POA: Diagnosis not present

## 2022-02-23 DIAGNOSIS — M6281 Muscle weakness (generalized): Secondary | ICD-10-CM | POA: Diagnosis not present

## 2022-02-23 DIAGNOSIS — Z9181 History of falling: Secondary | ICD-10-CM | POA: Diagnosis not present

## 2022-02-25 DIAGNOSIS — M6281 Muscle weakness (generalized): Secondary | ICD-10-CM | POA: Diagnosis not present

## 2022-02-25 DIAGNOSIS — Z9181 History of falling: Secondary | ICD-10-CM | POA: Diagnosis not present

## 2022-02-25 DIAGNOSIS — R279 Unspecified lack of coordination: Secondary | ICD-10-CM | POA: Diagnosis not present

## 2022-02-26 DIAGNOSIS — R590 Localized enlarged lymph nodes: Secondary | ICD-10-CM | POA: Diagnosis not present

## 2022-02-28 DIAGNOSIS — M1990 Unspecified osteoarthritis, unspecified site: Secondary | ICD-10-CM | POA: Diagnosis not present

## 2022-02-28 DIAGNOSIS — I160 Hypertensive urgency: Secondary | ICD-10-CM | POA: Diagnosis not present

## 2022-02-28 DIAGNOSIS — F0390 Unspecified dementia without behavioral disturbance: Secondary | ICD-10-CM | POA: Diagnosis not present

## 2022-02-28 DIAGNOSIS — E038 Other specified hypothyroidism: Secondary | ICD-10-CM | POA: Diagnosis not present

## 2022-02-28 DIAGNOSIS — D518 Other vitamin B12 deficiency anemias: Secondary | ICD-10-CM | POA: Diagnosis not present

## 2022-02-28 DIAGNOSIS — E559 Vitamin D deficiency, unspecified: Secondary | ICD-10-CM | POA: Diagnosis not present

## 2022-02-28 DIAGNOSIS — E781 Pure hyperglyceridemia: Secondary | ICD-10-CM | POA: Diagnosis not present

## 2022-02-28 DIAGNOSIS — E119 Type 2 diabetes mellitus without complications: Secondary | ICD-10-CM | POA: Diagnosis not present

## 2022-02-28 DIAGNOSIS — I5022 Chronic systolic (congestive) heart failure: Secondary | ICD-10-CM | POA: Diagnosis not present

## 2022-02-28 DIAGNOSIS — I4891 Unspecified atrial fibrillation: Secondary | ICD-10-CM | POA: Diagnosis not present

## 2022-02-28 DIAGNOSIS — I10 Essential (primary) hypertension: Secondary | ICD-10-CM | POA: Diagnosis not present

## 2022-02-28 DIAGNOSIS — I5021 Acute systolic (congestive) heart failure: Secondary | ICD-10-CM | POA: Diagnosis not present

## 2022-03-02 DIAGNOSIS — M6281 Muscle weakness (generalized): Secondary | ICD-10-CM | POA: Diagnosis not present

## 2022-03-02 DIAGNOSIS — Z9181 History of falling: Secondary | ICD-10-CM | POA: Diagnosis not present

## 2022-03-02 DIAGNOSIS — R279 Unspecified lack of coordination: Secondary | ICD-10-CM | POA: Diagnosis not present

## 2022-03-04 DIAGNOSIS — E119 Type 2 diabetes mellitus without complications: Secondary | ICD-10-CM | POA: Diagnosis not present

## 2022-03-04 DIAGNOSIS — E038 Other specified hypothyroidism: Secondary | ICD-10-CM | POA: Diagnosis not present

## 2022-03-04 DIAGNOSIS — Z79899 Other long term (current) drug therapy: Secondary | ICD-10-CM | POA: Diagnosis not present

## 2022-03-04 DIAGNOSIS — E559 Vitamin D deficiency, unspecified: Secondary | ICD-10-CM | POA: Diagnosis not present

## 2022-03-04 DIAGNOSIS — E782 Mixed hyperlipidemia: Secondary | ICD-10-CM | POA: Diagnosis not present

## 2022-03-04 DIAGNOSIS — D518 Other vitamin B12 deficiency anemias: Secondary | ICD-10-CM | POA: Diagnosis not present

## 2022-03-06 DIAGNOSIS — R279 Unspecified lack of coordination: Secondary | ICD-10-CM | POA: Diagnosis not present

## 2022-03-06 DIAGNOSIS — Z9181 History of falling: Secondary | ICD-10-CM | POA: Diagnosis not present

## 2022-03-06 DIAGNOSIS — M6281 Muscle weakness (generalized): Secondary | ICD-10-CM | POA: Diagnosis not present

## 2022-03-08 DIAGNOSIS — R279 Unspecified lack of coordination: Secondary | ICD-10-CM | POA: Diagnosis not present

## 2022-03-08 DIAGNOSIS — M6281 Muscle weakness (generalized): Secondary | ICD-10-CM | POA: Diagnosis not present

## 2022-03-08 DIAGNOSIS — Z9181 History of falling: Secondary | ICD-10-CM | POA: Diagnosis not present

## 2022-03-13 ENCOUNTER — Encounter: Payer: Self-pay | Admitting: *Deleted

## 2022-03-13 ENCOUNTER — Ambulatory Visit: Payer: Self-pay | Admitting: *Deleted

## 2022-03-13 DIAGNOSIS — R279 Unspecified lack of coordination: Secondary | ICD-10-CM | POA: Diagnosis not present

## 2022-03-13 DIAGNOSIS — M6281 Muscle weakness (generalized): Secondary | ICD-10-CM | POA: Diagnosis not present

## 2022-03-13 DIAGNOSIS — Z9181 History of falling: Secondary | ICD-10-CM | POA: Diagnosis not present

## 2022-03-13 NOTE — Patient Outreach (Signed)
  Care Coordination   Initial Visit Note   03/13/2022  Name: Virginia Young MRN: [id number] DOB: [birth year]  Virginia Young is a 81 y.o. year old female who sees Virginia Young, Virginia Charters, MD for primary care. I spoke with daughter, Virginia Young by phone today.  What matters to the patients health and wellness today?  No Interventions Identified.   SDOH assessments and interventions completed:  Yes.  SDOH Interventions Today    Flowsheet Row Most Recent Value  SDOH Interventions   Food Insecurity Interventions Intervention Not Indicated, Other (Comment)  [Verified by Daughter, Virginia Young]  Housing Interventions Intervention Not Indicated, Other (Comment)  [Verified by Daughter, Virginia Young]  Utilities Interventions Intervention Not Indicated, Other (Comment)  [Verified by Daughter, Virginia Young]  Alcohol Usage Interventions Intervention Not Indicated (Score <7), Other (Comment)  [Verified by Daughter, Virginia Young]  Financial Strain Interventions Intervention Not Indicated, Other (Comment)  [Verified by Daughter, Virginia Young]  Physical Activity Interventions Intervention Not Indicated, Other (Comments)  [Verified by Daughter, Virginia Young]  Stress Interventions Intervention Not Indicated, Other (Comment)  [Verified by Daughter, Virginia Young]  Social Connections Interventions Intervention Not Indicated, Other (Comment)  [Verified by Daughter, Virginia Young]        Care Coordination Interventions Activated:  Yes.   Care Coordination Interventions:  Yes, provided.   Follow up plan: No further intervention required.   Encounter Outcome:  Pt. Visit Completed.   Nat Christen, BSW, MSW, LCSW  Licensed Education officer, [facility]  Mailing [place] [address] [place], [place] [address] Physical Address-[address] [place], [place] [address] Toll Free Main # [phone] Fax # [phone] Cell #  [phone] [email or web address]

## 2022-03-13 NOTE — Patient Instructions (Signed)
Visit Information  Thank you for taking time to visit with me today. Please don't hesitate to contact me if I can be of assistance to you.   Please call the care guide team at [phone] if you need to cancel or reschedule your appointment.   If you are experiencing a Mental Health or Behavioral Health Crisis or need someone to talk to, please call the Suicide and Crisis Lifeline: 988 call the [place] National Suicide Prevention Lifeline: 1-800-273-8255 or TTY: 1-800-799-4 TTY (1-800-799-4889) to talk to a trained counselor call 1-800-273-TALK (toll free, 24 hour hotline) go to [facility] [address], [place] ([phone]) call the [place] Crisis Line: 800-939-9988 call 911  Patient verbalizes understanding of instructions and care plan provided today and agrees to view in MyChart. Active MyChart status and patient understanding of how to access instructions and care plan via MyChart confirmed with patient.     No further follow up required.  Roderick Calo, BSW, MSW, LCSW  Licensed Clinical Social Worker  [facility]  Mailing Address-[address], [place], [place] [address] Physical Address-[address], [place], [place] [address] Toll Free Main # [phone] Fax # [phone] Cell # [phone] [email or web address]            

## 2022-03-19 DIAGNOSIS — I739 Peripheral vascular disease, unspecified: Secondary | ICD-10-CM | POA: Diagnosis not present

## 2022-03-19 DIAGNOSIS — G8929 Other chronic pain: Secondary | ICD-10-CM | POA: Diagnosis not present

## 2022-03-19 DIAGNOSIS — K219 Gastro-esophageal reflux disease without esophagitis: Secondary | ICD-10-CM | POA: Diagnosis not present

## 2022-03-19 DIAGNOSIS — I10 Essential (primary) hypertension: Secondary | ICD-10-CM | POA: Diagnosis not present

## 2022-03-19 DIAGNOSIS — I4891 Unspecified atrial fibrillation: Secondary | ICD-10-CM | POA: Diagnosis not present

## 2022-03-19 DIAGNOSIS — R7303 Prediabetes: Secondary | ICD-10-CM | POA: Diagnosis not present

## 2022-03-19 DIAGNOSIS — I129 Hypertensive chronic kidney disease with stage 1 through stage 4 chronic kidney disease, or unspecified chronic kidney disease: Secondary | ICD-10-CM | POA: Diagnosis not present

## 2022-03-19 DIAGNOSIS — E782 Mixed hyperlipidemia: Secondary | ICD-10-CM | POA: Diagnosis not present

## 2022-03-19 DIAGNOSIS — I5022 Chronic systolic (congestive) heart failure: Secondary | ICD-10-CM | POA: Diagnosis not present

## 2022-03-20 DIAGNOSIS — I10 Essential (primary) hypertension: Secondary | ICD-10-CM | POA: Diagnosis not present

## 2022-04-01 DIAGNOSIS — F0390 Unspecified dementia without behavioral disturbance: Secondary | ICD-10-CM | POA: Diagnosis not present

## 2022-04-01 DIAGNOSIS — E038 Other specified hypothyroidism: Secondary | ICD-10-CM | POA: Diagnosis not present

## 2022-04-01 DIAGNOSIS — E781 Pure hyperglyceridemia: Secondary | ICD-10-CM | POA: Diagnosis not present

## 2022-04-01 DIAGNOSIS — I4891 Unspecified atrial fibrillation: Secondary | ICD-10-CM | POA: Diagnosis not present

## 2022-04-01 DIAGNOSIS — D518 Other vitamin B12 deficiency anemias: Secondary | ICD-10-CM | POA: Diagnosis not present

## 2022-04-01 DIAGNOSIS — I5021 Acute systolic (congestive) heart failure: Secondary | ICD-10-CM | POA: Diagnosis not present

## 2022-04-01 DIAGNOSIS — I5022 Chronic systolic (congestive) heart failure: Secondary | ICD-10-CM | POA: Diagnosis not present

## 2022-04-01 DIAGNOSIS — I10 Essential (primary) hypertension: Secondary | ICD-10-CM | POA: Diagnosis not present

## 2022-04-01 DIAGNOSIS — E119 Type 2 diabetes mellitus without complications: Secondary | ICD-10-CM | POA: Diagnosis not present

## 2022-04-01 DIAGNOSIS — I160 Hypertensive urgency: Secondary | ICD-10-CM | POA: Diagnosis not present

## 2022-04-01 DIAGNOSIS — M1990 Unspecified osteoarthritis, unspecified site: Secondary | ICD-10-CM | POA: Diagnosis not present

## 2022-04-01 DIAGNOSIS — E559 Vitamin D deficiency, unspecified: Secondary | ICD-10-CM | POA: Diagnosis not present

## 2022-04-04 DIAGNOSIS — R4189 Other symptoms and signs involving cognitive functions and awareness: Secondary | ICD-10-CM | POA: Diagnosis not present

## 2022-04-04 DIAGNOSIS — F329 Major depressive disorder, single episode, unspecified: Secondary | ICD-10-CM | POA: Diagnosis not present

## 2022-04-04 DIAGNOSIS — F5102 Adjustment insomnia: Secondary | ICD-10-CM | POA: Diagnosis not present

## 2022-04-19 DIAGNOSIS — I10 Essential (primary) hypertension: Secondary | ICD-10-CM | POA: Diagnosis not present

## 2022-04-27 DIAGNOSIS — E119 Type 2 diabetes mellitus without complications: Secondary | ICD-10-CM | POA: Diagnosis not present

## 2022-04-27 DIAGNOSIS — I5022 Chronic systolic (congestive) heart failure: Secondary | ICD-10-CM | POA: Diagnosis not present

## 2022-04-27 DIAGNOSIS — I5021 Acute systolic (congestive) heart failure: Secondary | ICD-10-CM | POA: Diagnosis not present

## 2022-04-27 DIAGNOSIS — E781 Pure hyperglyceridemia: Secondary | ICD-10-CM | POA: Diagnosis not present

## 2022-04-27 DIAGNOSIS — I160 Hypertensive urgency: Secondary | ICD-10-CM | POA: Diagnosis not present

## 2022-04-27 DIAGNOSIS — M1990 Unspecified osteoarthritis, unspecified site: Secondary | ICD-10-CM | POA: Diagnosis not present

## 2022-04-27 DIAGNOSIS — I4891 Unspecified atrial fibrillation: Secondary | ICD-10-CM | POA: Diagnosis not present

## 2022-04-27 DIAGNOSIS — F0390 Unspecified dementia without behavioral disturbance: Secondary | ICD-10-CM | POA: Diagnosis not present

## 2022-04-27 DIAGNOSIS — I10 Essential (primary) hypertension: Secondary | ICD-10-CM | POA: Diagnosis not present

## 2022-04-27 DIAGNOSIS — E559 Vitamin D deficiency, unspecified: Secondary | ICD-10-CM | POA: Diagnosis not present

## 2022-04-27 DIAGNOSIS — E038 Other specified hypothyroidism: Secondary | ICD-10-CM | POA: Diagnosis not present

## 2022-04-27 DIAGNOSIS — D518 Other vitamin B12 deficiency anemias: Secondary | ICD-10-CM | POA: Diagnosis not present

## 2022-04-30 DIAGNOSIS — M1990 Unspecified osteoarthritis, unspecified site: Secondary | ICD-10-CM | POA: Diagnosis not present

## 2022-04-30 DIAGNOSIS — G8929 Other chronic pain: Secondary | ICD-10-CM | POA: Diagnosis not present

## 2022-04-30 DIAGNOSIS — K219 Gastro-esophageal reflux disease without esophagitis: Secondary | ICD-10-CM | POA: Diagnosis not present

## 2022-04-30 DIAGNOSIS — F0390 Unspecified dementia without behavioral disturbance: Secondary | ICD-10-CM | POA: Diagnosis not present

## 2022-04-30 DIAGNOSIS — I5022 Chronic systolic (congestive) heart failure: Secondary | ICD-10-CM | POA: Diagnosis not present

## 2022-04-30 DIAGNOSIS — R7303 Prediabetes: Secondary | ICD-10-CM | POA: Diagnosis not present

## 2022-04-30 DIAGNOSIS — I10 Essential (primary) hypertension: Secondary | ICD-10-CM | POA: Diagnosis not present

## 2022-04-30 DIAGNOSIS — I4891 Unspecified atrial fibrillation: Secondary | ICD-10-CM | POA: Diagnosis not present

## 2022-05-02 DIAGNOSIS — E663 Overweight: Secondary | ICD-10-CM | POA: Diagnosis not present

## 2022-05-02 DIAGNOSIS — R4189 Other symptoms and signs involving cognitive functions and awareness: Secondary | ICD-10-CM | POA: Diagnosis not present

## 2022-05-02 DIAGNOSIS — F5102 Adjustment insomnia: Secondary | ICD-10-CM | POA: Diagnosis not present

## 2022-05-02 DIAGNOSIS — F329 Major depressive disorder, single episode, unspecified: Secondary | ICD-10-CM | POA: Diagnosis not present

## 2022-05-15 DIAGNOSIS — Z23 Encounter for immunization: Secondary | ICD-10-CM | POA: Diagnosis not present

## 2022-05-20 DIAGNOSIS — I10 Essential (primary) hypertension: Secondary | ICD-10-CM | POA: Diagnosis not present

## 2022-05-28 DIAGNOSIS — K219 Gastro-esophageal reflux disease without esophagitis: Secondary | ICD-10-CM | POA: Diagnosis not present

## 2022-05-28 DIAGNOSIS — I129 Hypertensive chronic kidney disease with stage 1 through stage 4 chronic kidney disease, or unspecified chronic kidney disease: Secondary | ICD-10-CM | POA: Diagnosis not present

## 2022-05-28 DIAGNOSIS — I10 Essential (primary) hypertension: Secondary | ICD-10-CM | POA: Diagnosis not present

## 2022-05-28 DIAGNOSIS — I5022 Chronic systolic (congestive) heart failure: Secondary | ICD-10-CM | POA: Diagnosis not present

## 2022-05-28 DIAGNOSIS — I4891 Unspecified atrial fibrillation: Secondary | ICD-10-CM | POA: Diagnosis not present

## 2022-05-28 DIAGNOSIS — F0390 Unspecified dementia without behavioral disturbance: Secondary | ICD-10-CM | POA: Diagnosis not present

## 2022-05-28 DIAGNOSIS — E782 Mixed hyperlipidemia: Secondary | ICD-10-CM | POA: Diagnosis not present

## 2022-05-28 DIAGNOSIS — I739 Peripheral vascular disease, unspecified: Secondary | ICD-10-CM | POA: Diagnosis not present

## 2022-05-28 DIAGNOSIS — F418 Other specified anxiety disorders: Secondary | ICD-10-CM | POA: Diagnosis not present

## 2022-06-06 DIAGNOSIS — F5102 Adjustment insomnia: Secondary | ICD-10-CM | POA: Diagnosis not present

## 2022-06-06 DIAGNOSIS — R4189 Other symptoms and signs involving cognitive functions and awareness: Secondary | ICD-10-CM | POA: Diagnosis not present

## 2022-06-06 DIAGNOSIS — F329 Major depressive disorder, single episode, unspecified: Secondary | ICD-10-CM | POA: Diagnosis not present

## 2022-06-06 DIAGNOSIS — E663 Overweight: Secondary | ICD-10-CM | POA: Diagnosis not present

## 2022-06-13 DIAGNOSIS — E119 Type 2 diabetes mellitus without complications: Secondary | ICD-10-CM | POA: Diagnosis not present

## 2022-06-13 DIAGNOSIS — E782 Mixed hyperlipidemia: Secondary | ICD-10-CM | POA: Diagnosis not present

## 2022-06-13 DIAGNOSIS — E038 Other specified hypothyroidism: Secondary | ICD-10-CM | POA: Diagnosis not present

## 2022-06-13 DIAGNOSIS — Z79899 Other long term (current) drug therapy: Secondary | ICD-10-CM | POA: Diagnosis not present

## 2022-06-13 DIAGNOSIS — D518 Other vitamin B12 deficiency anemias: Secondary | ICD-10-CM | POA: Diagnosis not present

## 2022-06-13 DIAGNOSIS — F4320 Adjustment disorder, unspecified: Secondary | ICD-10-CM | POA: Diagnosis not present

## 2022-06-13 DIAGNOSIS — F321 Major depressive disorder, single episode, moderate: Secondary | ICD-10-CM | POA: Diagnosis not present

## 2022-06-14 DIAGNOSIS — F418 Other specified anxiety disorders: Secondary | ICD-10-CM | POA: Diagnosis not present

## 2022-06-18 DIAGNOSIS — I10 Essential (primary) hypertension: Secondary | ICD-10-CM | POA: Diagnosis not present

## 2022-06-18 DIAGNOSIS — E559 Vitamin D deficiency, unspecified: Secondary | ICD-10-CM | POA: Diagnosis not present

## 2022-06-18 DIAGNOSIS — I4891 Unspecified atrial fibrillation: Secondary | ICD-10-CM | POA: Diagnosis not present

## 2022-06-18 DIAGNOSIS — E119 Type 2 diabetes mellitus without complications: Secondary | ICD-10-CM | POA: Diagnosis not present

## 2022-06-18 DIAGNOSIS — F0390 Unspecified dementia without behavioral disturbance: Secondary | ICD-10-CM | POA: Diagnosis not present

## 2022-06-18 DIAGNOSIS — I5022 Chronic systolic (congestive) heart failure: Secondary | ICD-10-CM | POA: Diagnosis not present

## 2022-06-18 DIAGNOSIS — D518 Other vitamin B12 deficiency anemias: Secondary | ICD-10-CM | POA: Diagnosis not present

## 2022-06-18 DIAGNOSIS — E781 Pure hyperglyceridemia: Secondary | ICD-10-CM | POA: Diagnosis not present

## 2022-06-18 DIAGNOSIS — I5021 Acute systolic (congestive) heart failure: Secondary | ICD-10-CM | POA: Diagnosis not present

## 2022-06-18 DIAGNOSIS — M1990 Unspecified osteoarthritis, unspecified site: Secondary | ICD-10-CM | POA: Diagnosis not present

## 2022-06-18 DIAGNOSIS — E038 Other specified hypothyroidism: Secondary | ICD-10-CM | POA: Diagnosis not present

## 2022-06-18 DIAGNOSIS — I160 Hypertensive urgency: Secondary | ICD-10-CM | POA: Diagnosis not present

## 2022-06-19 DIAGNOSIS — I10 Essential (primary) hypertension: Secondary | ICD-10-CM | POA: Diagnosis not present

## 2022-09-30 IMAGING — CT CT L SPINE W/O CM
3 series · 11 of 35 positions shown, 13 images · IV contrast (Omnipaque or Isovue)
Comparison: No prior CT of the lumbar spine, correlation is made
with CT abdomen pelvis 09/17/2020

CLINICAL DATA: Fall after syncopal episode

EXAM:
CT LUMBAR SPINE WITHOUT CONTRAST
TECHNIQUE: Multidetector CT imaging of the lumbar spine was performed without
intravenous contrast administration. Multiplanar CT image
reconstructions were also generated.

[Series 3: axial soft tissue · axial · 0.36mm/px · z∈[+774,+1004]mm · 3 of 187 slices shown, 4 images]
[im 43/187  soft-tissue]
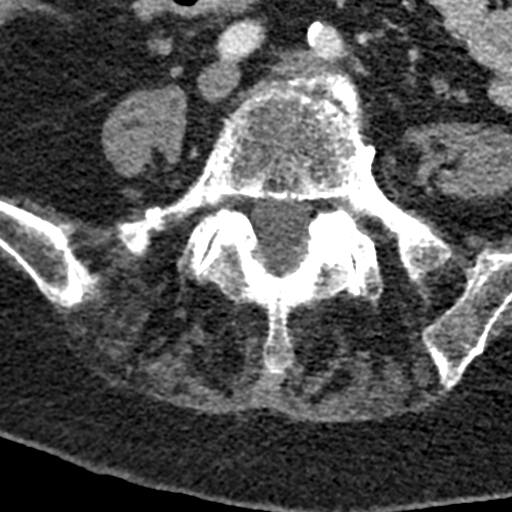
[im 43/187  bone]
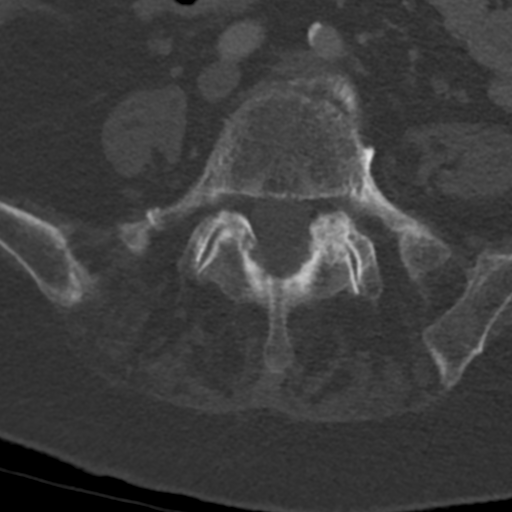
[im 101/187  bone]
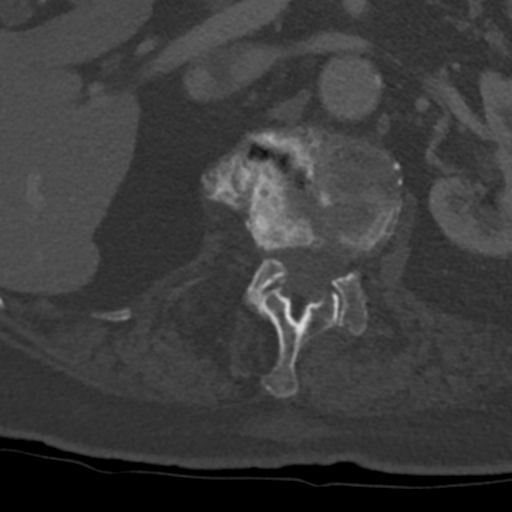
[im 158/187  bone]
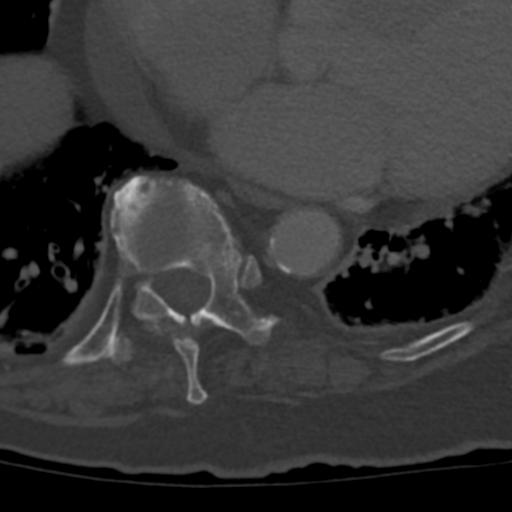

[Series 4: sag bone · sagittal · 0.36mm/px · 5 of 104 slices shown, 6 images]
[im 35/104  bone]
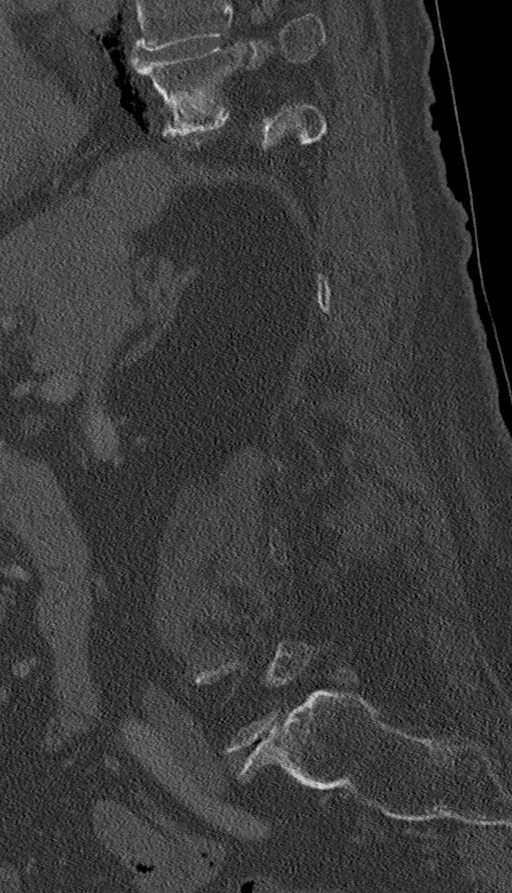
[im 43/104  bone]
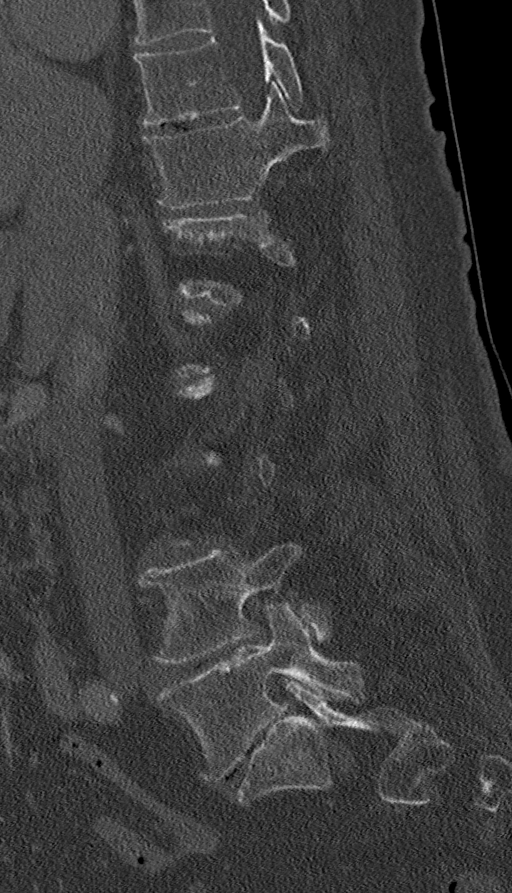
[im 52/104  soft-tissue]
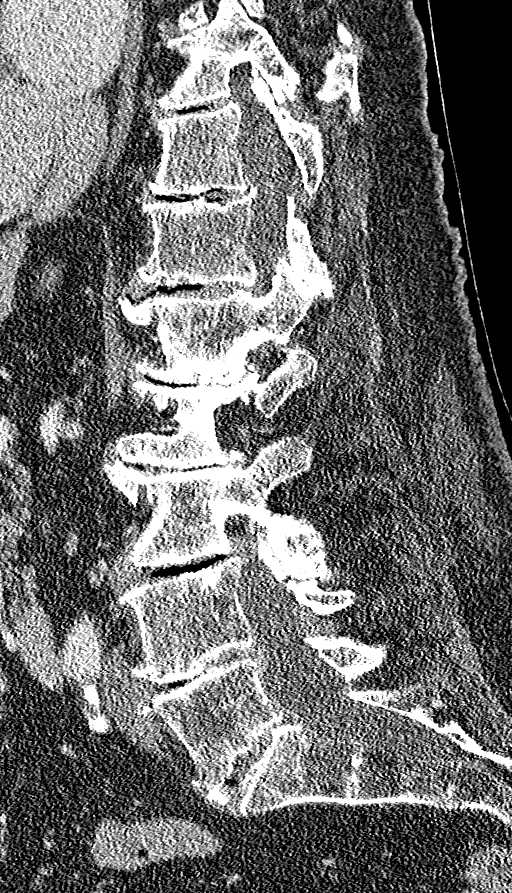
[im 52/104  bone]
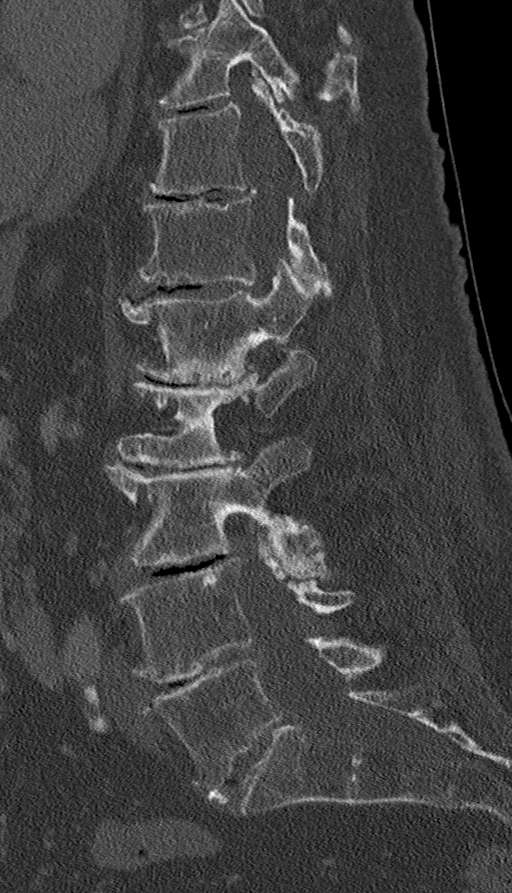
[im 61/104  bone]
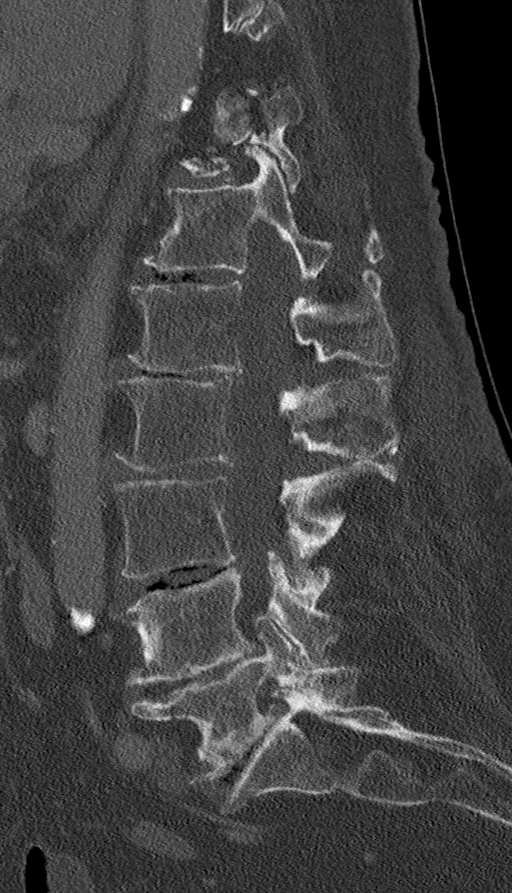
[im 69/104  bone]
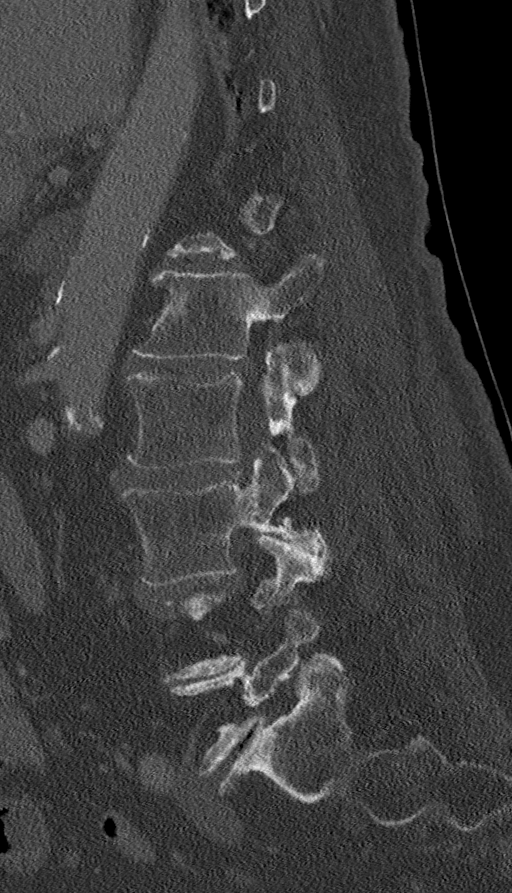

[Series 5: cor bone · coronal · 0.40mm/px · 3 of 92 slices shown]
[im 19/92  bone]
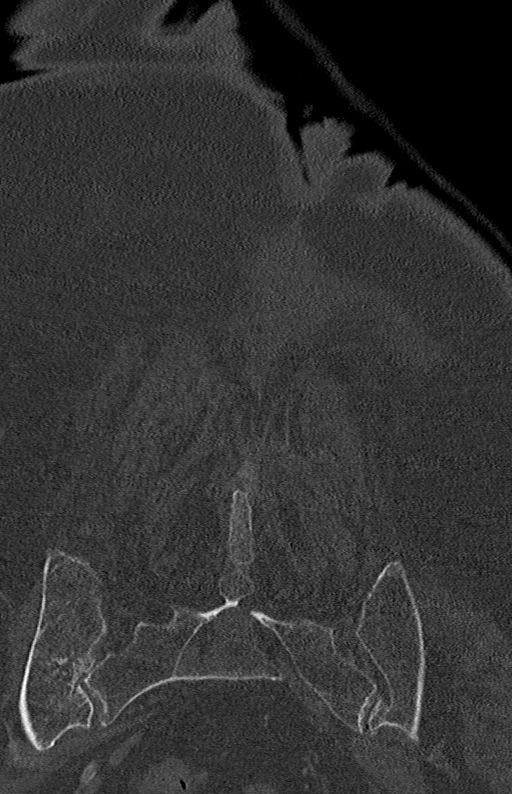
[im 37/92  bone]
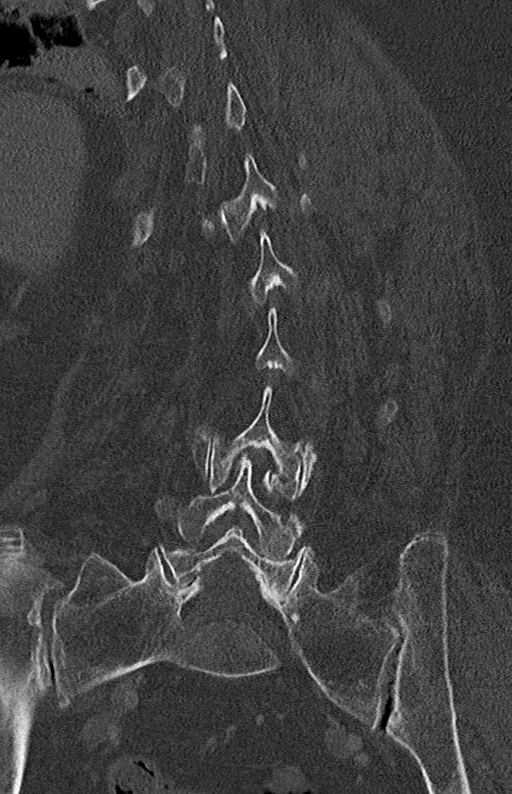
[im 55/92  bone]
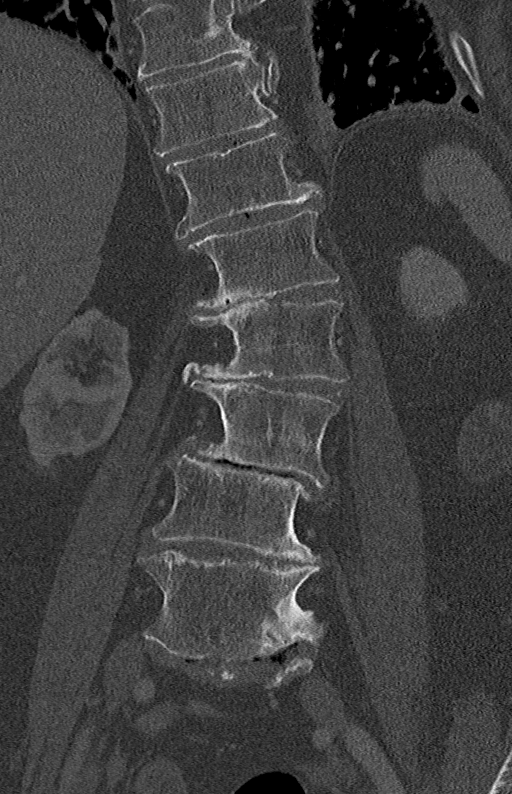

[11 of 35 positions shown; findings below may reference images not displayed]

FINDINGS: Segmentation: 5 lumbar type vertebrae. Lumbosacral assimilation
joints bilaterally L5-S1.

Alignment: Scoliosis, with levocurvature of the upper lumbar spine
and compensatory dextrocurvature of the lower lumbar spine.
Approximately 14 mm left lateral listhesis of L3 on L4. Trace
retrolisthesis L1 on L2, L2 on L3, and L4 on L5, which appears
degenerative and unchanged compared to 09/17/2020.

Vertebrae: Osteopenia. No acute fracture or focal pathologic
process.

Paraspinal and other soft tissues: Please see same-day CT abdomen
pelvis.

Disc levels:

T12-L1: Disc height loss with vacuum disc phenomenon and disc
calcifications. Facet arthropathy. No spinal canal stenosis.
Moderate right neural foraminal narrowing.

L1-L2: Disc height loss with vacuum disc phenomenon, trace
retrolisthesis, and disc osteophyte complex. No spinal canal
stenosis. Right-greater-than-left facet arthropathy. Mild right
neural foraminal narrowing.

L2-L3: Disc height loss with vacuum disc phenomenon, trace
retrolisthesis, and disc osteophyte complex. Facet arthropathy. Mild
spinal canal stenosis. Mild right neural foraminal narrowing.

L3-L4: Disc height loss with vacuum disc phenomenon and disc
osteophyte complex. Severe facet arthropathy. Moderate spinal canal
stenosis. No neural foraminal narrowing.

L4-L5: Vacuum disc phenomenon, disc height loss, and disc osteophyte
complex, with trace retrolisthesis. Left-greater-than-right facet
arthropathy. Mild spinal canal stenosis. Moderate greater than right
neural foraminal narrowing.

L5-S1: Disc height loss with vacuum disc phenomenon and disc
osteophyte complex. Facet arthropathy. No spinal canal stenosis.
Moderate left neural foraminal narrowing.
IMPRESSION: 1. No acute fracture or traumatic listhesis in the lumbar spine.
2. Scoliosis with multilevel degenerative changes, as described
above, worst at L3-L4, where there is 14 mm of left lateral
listhesis and moderate spinal canal stenosis.

## 2022-09-30 IMAGING — CR DG KNEE COMPLETE 4+V*R*
4 series · 4 of 4 positions shown · non-contrast
Comparison: 09/15/2020

CLINICAL DATA: Pain right knee, recent fall

EXAM:
RIGHT KNEE - COMPLETE 4+ VIEW

[x knee lat right (1 of 4)]
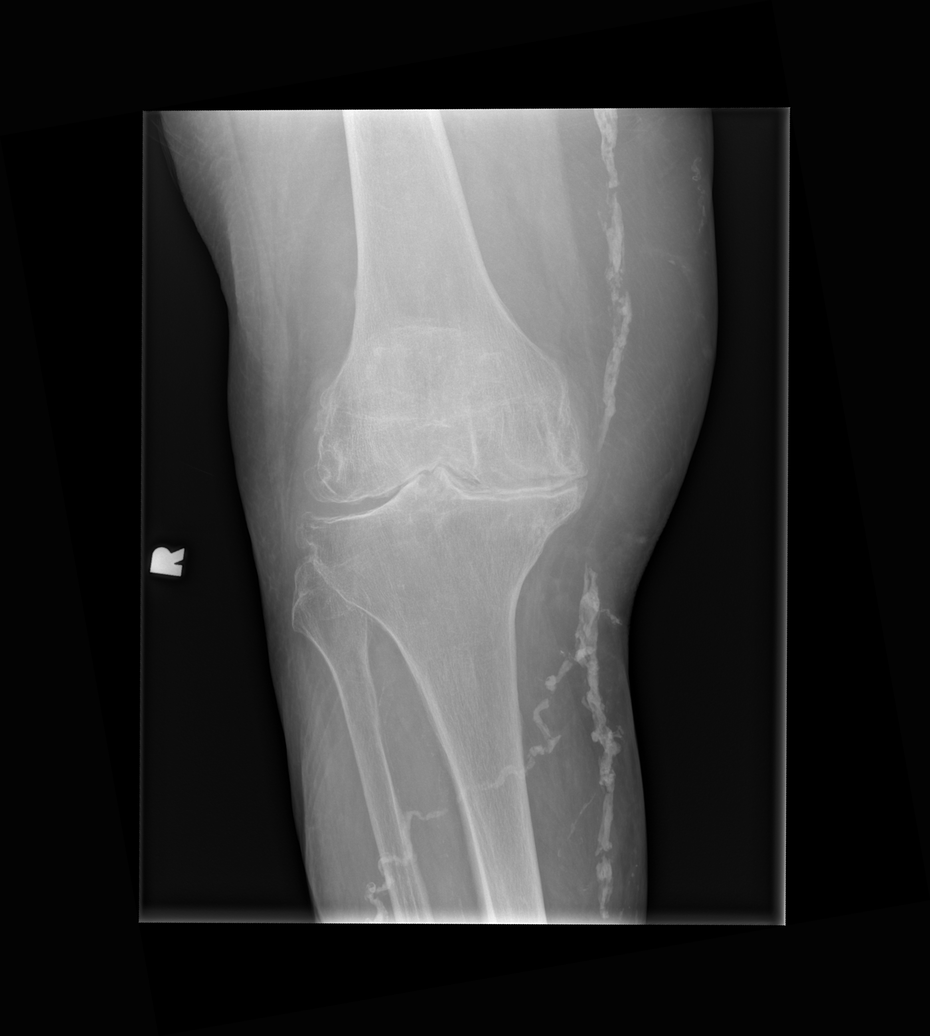

[x knee lat right (2 of 4)]
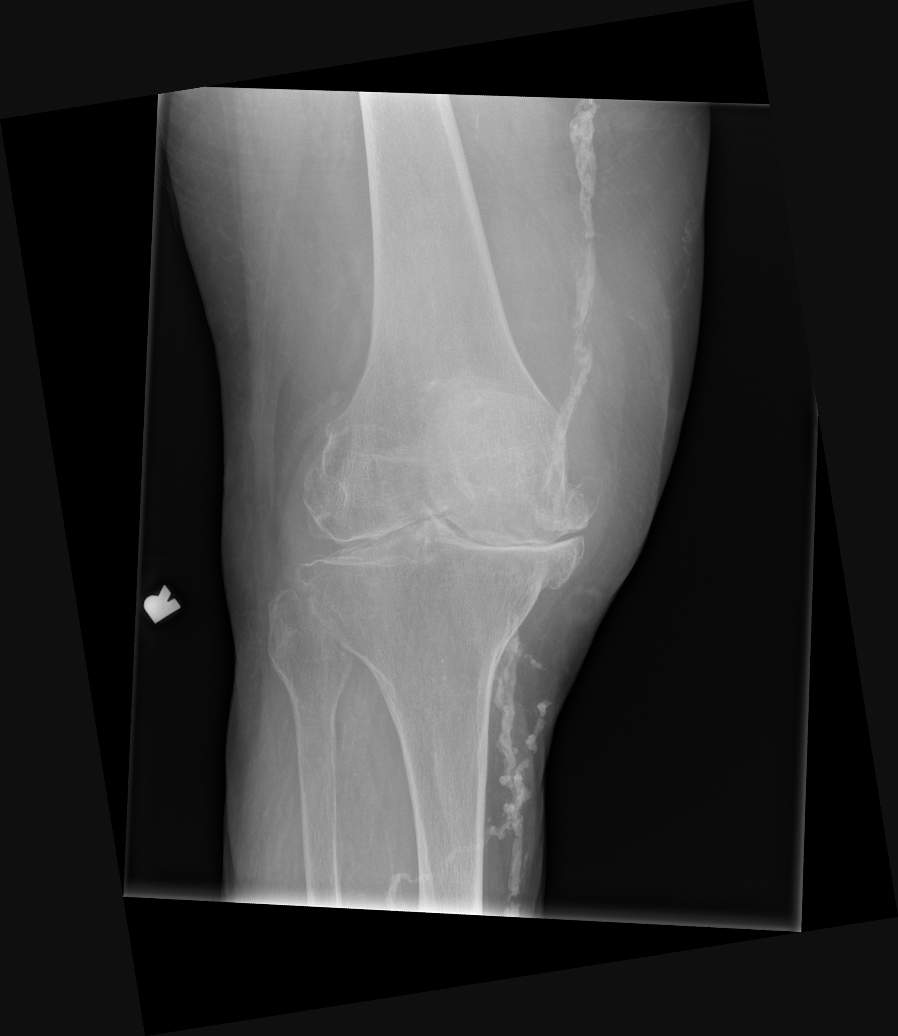

[x knee lat right (3 of 4)]
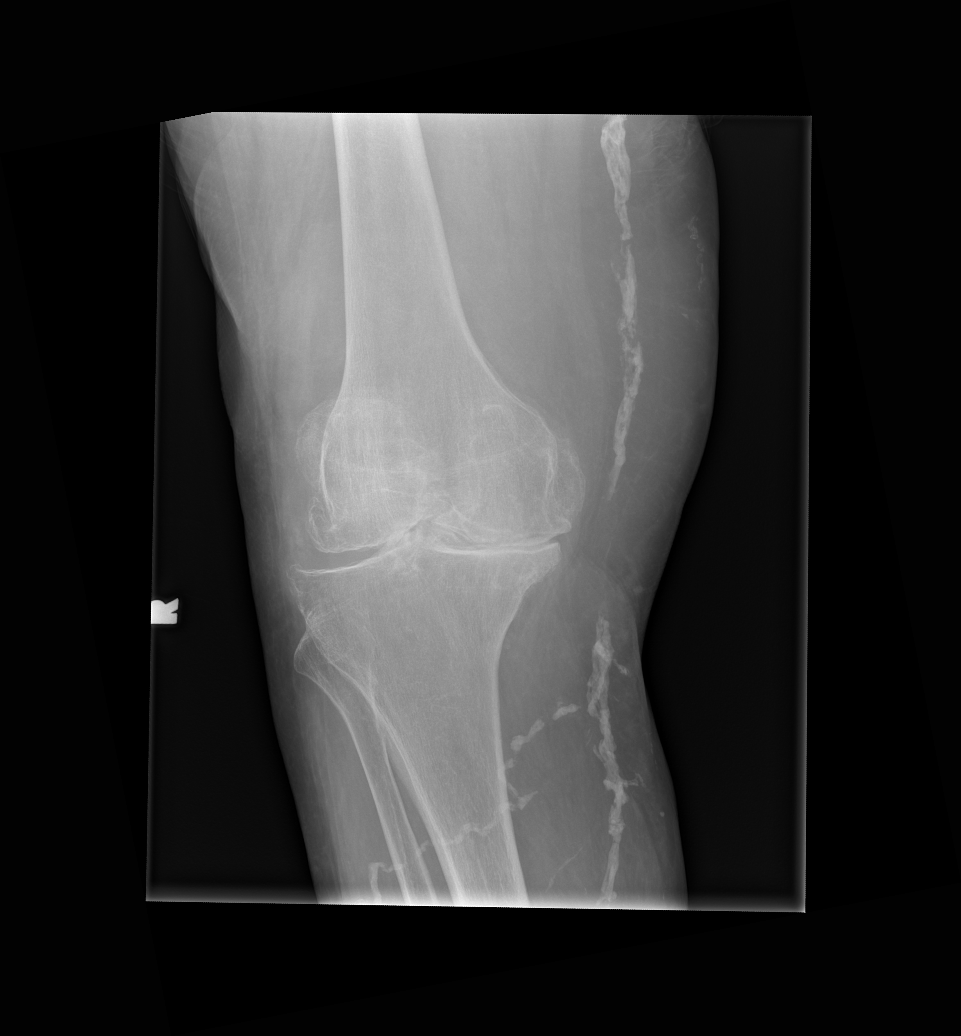

[x knee lat right (4 of 4)]
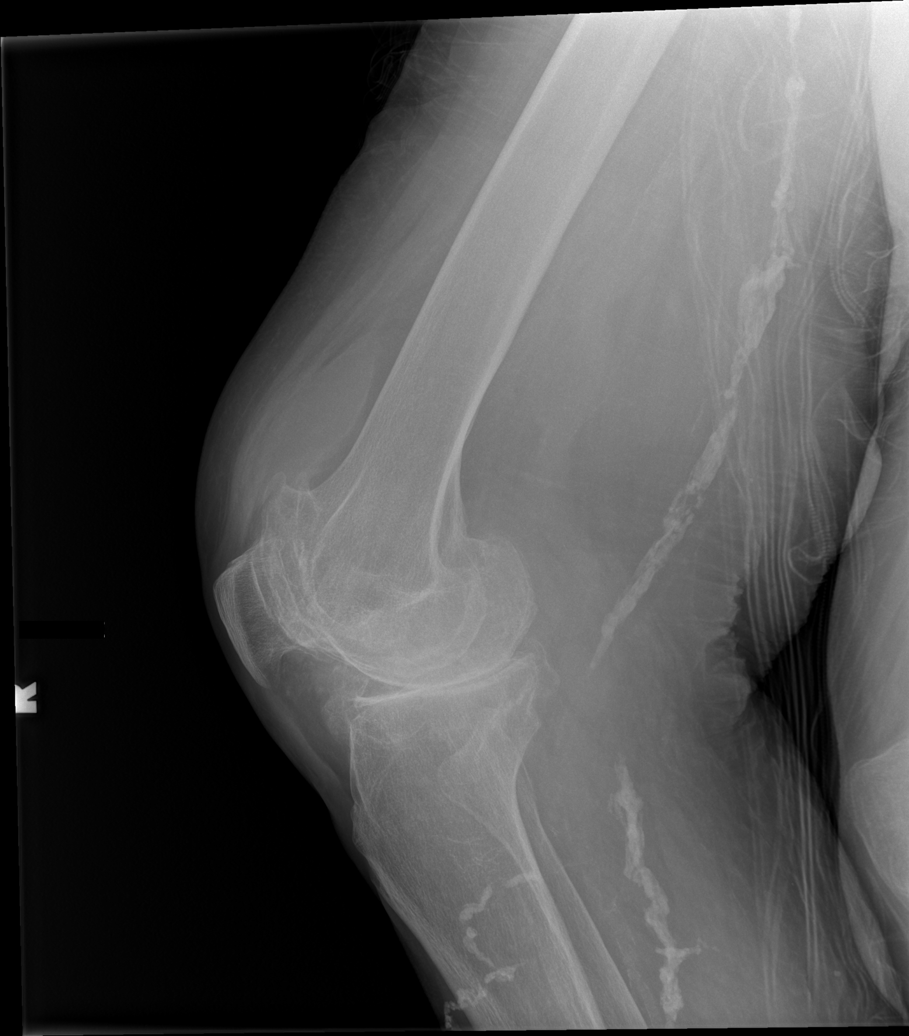

[4 of 4 positions shown; findings below may reference images not displayed]

FINDINGS: No recent fracture or dislocation is seen. Severe degenerative
changes are noted with joint space narrowing and bony spurs, more so
in the medial and patellofemoral compartments. There is moderate
effusion in the suprapatellar bursa. Extensive vascular
calcifications are seen.
IMPRESSION: No recent fracture or dislocation is seen. Severe degenerative
changes are noted in the right knee. There is moderate effusion in
the suprapatellar bursa.

## 2023-04-01 ENCOUNTER — Emergency Department (HOSPITAL_COMMUNITY): Payer: Medicare Other

## 2023-04-01 ENCOUNTER — Other Ambulatory Visit: Payer: Self-pay

## 2023-04-01 ENCOUNTER — Inpatient Hospital Stay (HOSPITAL_COMMUNITY)
Admission: EM | Admit: 2023-04-01 | Discharge: 2023-04-04 | DRG: 193 | Disposition: A | Discharge status: Nursing Home (Includes ICF) | Payer: Medicare Other | Source: Skilled Nursing Facility | Attending: Family Medicine | Admitting: Family Medicine

## 2023-04-01 DIAGNOSIS — E785 Hyperlipidemia, unspecified: Secondary | ICD-10-CM | POA: Diagnosis present

## 2023-04-01 DIAGNOSIS — Z1152 Encounter for screening for COVID-19: Secondary | ICD-10-CM

## 2023-04-01 DIAGNOSIS — Z7901 Long term (current) use of anticoagulants: Secondary | ICD-10-CM | POA: Diagnosis not present

## 2023-04-01 DIAGNOSIS — Z79899 Other long term (current) drug therapy: Secondary | ICD-10-CM | POA: Diagnosis not present

## 2023-04-01 DIAGNOSIS — I5022 Chronic systolic (congestive) heart failure: Secondary | ICD-10-CM | POA: Diagnosis present

## 2023-04-01 DIAGNOSIS — G35 Multiple sclerosis: Secondary | ICD-10-CM | POA: Diagnosis present

## 2023-04-01 DIAGNOSIS — Z882 Allergy status to sulfonamides status: Secondary | ICD-10-CM | POA: Diagnosis not present

## 2023-04-01 DIAGNOSIS — J189 Pneumonia, unspecified organism: Principal | ICD-10-CM | POA: Diagnosis present

## 2023-04-01 DIAGNOSIS — F32A Depression, unspecified: Secondary | ICD-10-CM | POA: Diagnosis present

## 2023-04-01 DIAGNOSIS — R7989 Other specified abnormal findings of blood chemistry: Secondary | ICD-10-CM | POA: Diagnosis present

## 2023-04-01 DIAGNOSIS — Z833 Family history of diabetes mellitus: Secondary | ICD-10-CM

## 2023-04-01 DIAGNOSIS — M810 Age-related osteoporosis without current pathological fracture: Secondary | ICD-10-CM | POA: Diagnosis present

## 2023-04-01 DIAGNOSIS — J9601 Acute respiratory failure with hypoxia: Principal | ICD-10-CM

## 2023-04-01 DIAGNOSIS — I428 Other cardiomyopathies: Secondary | ICD-10-CM | POA: Diagnosis present

## 2023-04-01 DIAGNOSIS — I482 Chronic atrial fibrillation, unspecified: Secondary | ICD-10-CM | POA: Diagnosis present

## 2023-04-01 DIAGNOSIS — Z7984 Long term (current) use of oral hypoglycemic drugs: Secondary | ICD-10-CM

## 2023-04-01 DIAGNOSIS — Z823 Family history of stroke: Secondary | ICD-10-CM | POA: Diagnosis not present

## 2023-04-01 DIAGNOSIS — Z888 Allergy status to other drugs, medicaments and biological substances status: Secondary | ICD-10-CM

## 2023-04-01 DIAGNOSIS — M1990 Unspecified osteoarthritis, unspecified site: Secondary | ICD-10-CM | POA: Diagnosis present

## 2023-04-01 DIAGNOSIS — R0602 Shortness of breath: Secondary | ICD-10-CM | POA: Diagnosis present

## 2023-04-01 DIAGNOSIS — I4821 Permanent atrial fibrillation: Secondary | ICD-10-CM | POA: Diagnosis not present

## 2023-04-01 DIAGNOSIS — K219 Gastro-esophageal reflux disease without esophagitis: Secondary | ICD-10-CM | POA: Diagnosis present

## 2023-04-01 DIAGNOSIS — E871 Hypo-osmolality and hyponatremia: Secondary | ICD-10-CM

## 2023-04-01 DIAGNOSIS — N179 Acute kidney failure, unspecified: Secondary | ICD-10-CM | POA: Diagnosis not present

## 2023-04-01 DIAGNOSIS — J9621 Acute and chronic respiratory failure with hypoxia: Secondary | ICD-10-CM | POA: Diagnosis present

## 2023-04-01 DIAGNOSIS — J440 Chronic obstructive pulmonary disease with acute lower respiratory infection: Secondary | ICD-10-CM | POA: Diagnosis present

## 2023-04-01 DIAGNOSIS — I4891 Unspecified atrial fibrillation: Secondary | ICD-10-CM | POA: Diagnosis present

## 2023-04-01 DIAGNOSIS — Z8249 Family history of ischemic heart disease and other diseases of the circulatory system: Secondary | ICD-10-CM | POA: Diagnosis not present

## 2023-04-01 LAB — COMPREHENSIVE METABOLIC PANEL
ALT: 13 U/L (ref 0–44)
AST: 20 U/L (ref 15–41)
Albumin: 3 g/dL — ABNORMAL LOW (ref 3.5–5.0)
Alkaline Phosphatase: 63 U/L (ref 38–126)
Anion gap: 10 (ref 5–15)
BUN: 12 mg/dL (ref 8–23)
CO2: 28 mmol/L (ref 22–32)
Calcium: 8.3 mg/dL — ABNORMAL LOW (ref 8.9–10.3)
Chloride: 90 mmol/L — ABNORMAL LOW (ref 98–111)
Creatinine, Ser: 1.08 mg/dL — ABNORMAL HIGH (ref 0.44–1.00)
GFR, Estimated: 51 mL/min — ABNORMAL LOW (ref >60)
Glucose, Bld: 123 mg/dL — ABNORMAL HIGH (ref 70–99)
Potassium: 4 mmol/L (ref 3.5–5.1)
Sodium: 128 mmol/L — ABNORMAL LOW (ref 135–145)
Total Bilirubin: 0.6 mg/dL (ref 0.3–1.2)
Total Protein: 6.6 g/dL (ref 6.5–8.1)

## 2023-04-01 LAB — BRAIN NATRIURETIC PEPTIDE: B Natriuretic Peptide: 204 pg/mL — ABNORMAL HIGH (ref 0.0–100.0)

## 2023-04-01 LAB — BLOOD GAS, VENOUS
Acid-Base Excess: 7.9 mmol/L — ABNORMAL HIGH (ref 0.0–2.0)
Bicarbonate: 34.2 mmol/L — ABNORMAL HIGH (ref 20.0–28.0)
Drawn by: 4237
O2 Saturation: 25.9 %
Patient temperature: 36.8
pCO2, Ven: 54 mm[Hg] (ref 44–60)
pH, Ven: 7.41 (ref 7.25–7.43)
pO2, Ven: <31 mm[Hg] — CL (ref 32–45)

## 2023-04-01 LAB — CBC WITH DIFFERENTIAL/PLATELET
Abs Immature Granulocytes: 0.1 10*3/uL — ABNORMAL HIGH (ref 0.00–0.07)
Basophils Absolute: 0.1 10*3/uL (ref 0.0–0.1)
Basophils Relative: 0 %
Eosinophils Absolute: 0.3 10*3/uL (ref 0.0–0.5)
Eosinophils Relative: 2 %
HCT: 37.7 % (ref 36.0–46.0)
Hemoglobin: 12.3 g/dL (ref 12.0–15.0)
Immature Granulocytes: 1 %
Lymphocytes Relative: 20 %
Lymphs Abs: 2.7 10*3/uL (ref 0.7–4.0)
MCH: 28.5 pg (ref 26.0–34.0)
MCHC: 32.6 g/dL (ref 30.0–36.0)
MCV: 87.5 fL (ref 80.0–100.0)
Monocytes Absolute: 1.7 10*3/uL — ABNORMAL HIGH (ref 0.1–1.0)
Monocytes Relative: 13 %
Neutro Abs: 8.5 10*3/uL — ABNORMAL HIGH (ref 1.7–7.7)
Neutrophils Relative %: 64 %
Platelets: 204 10*3/uL (ref 150–400)
RBC: 4.31 MIL/uL (ref 3.87–5.11)
RDW: 14 % (ref 11.5–15.5)
WBC: 13.4 10*3/uL — ABNORMAL HIGH (ref 4.0–10.5)
nRBC: 0 % (ref 0.0–0.2)

## 2023-04-01 LAB — SARS CORONAVIRUS 2 BY RT PCR: SARS Coronavirus 2 by RT PCR: NEGATIVE

## 2023-04-01 LAB — OSMOLALITY: Osmolality: 289 mosm/kg (ref 275–295)

## 2023-04-01 MED ORDER — PANTOPRAZOLE SODIUM 40 MG PO TBEC
40.0000 mg | DELAYED_RELEASE_TABLET | Freq: Two times a day (BID) | ORAL | Status: DC
  Administered 2023-04-01 – 2023-04-04 (×6): 40 mg via ORAL
  Filled 2023-04-01 (×6): qty 1

## 2023-04-01 MED ORDER — ACETAMINOPHEN 650 MG RE SUPP: 650.0000 mg | Freq: Four times a day (QID) | RECTAL | Status: DC | PRN

## 2023-04-01 MED ORDER — SODIUM CHLORIDE 0.9 % IV SOLN
500.0000 mg | INTRAVENOUS | Status: AC
  Administered 2023-04-02 – 2023-04-03 (×2): 500 mg via INTRAVENOUS
  Filled 2023-04-01 (×2): qty 5

## 2023-04-01 MED ORDER — APIXABAN 5 MG PO TABS
5.0000 mg | ORAL_TABLET | Freq: Two times a day (BID) | ORAL | Status: DC
  Administered 2023-04-01 – 2023-04-04 (×6): 5 mg via ORAL
  Filled 2023-04-01 (×6): qty 1

## 2023-04-01 MED ORDER — ALLOPURINOL 100 MG PO TABS
100.0000 mg | ORAL_TABLET | Freq: Every day | ORAL | Status: DC
  Administered 2023-04-02 – 2023-04-04 (×3): 100 mg via ORAL
  Filled 2023-04-01 (×3): qty 1

## 2023-04-01 MED ORDER — METOPROLOL TARTRATE 25 MG PO TABS
12.5000 mg | ORAL_TABLET | Freq: Three times a day (TID) | ORAL | Status: DC
  Administered 2023-04-01 – 2023-04-04 (×8): 12.5 mg via ORAL
  Filled 2023-04-01 (×8): qty 1

## 2023-04-01 MED ORDER — POLYETHYLENE GLYCOL 3350 17 G PO PACK: 17.0000 g | PACK | Freq: Every day | ORAL | Status: DC | PRN

## 2023-04-01 MED ORDER — ROSUVASTATIN CALCIUM 20 MG PO TABS
20.0000 mg | ORAL_TABLET | Freq: Every day | ORAL | Status: DC
  Administered 2023-04-02 – 2023-04-04 (×3): 20 mg via ORAL
  Filled 2023-04-01 (×3): qty 1

## 2023-04-01 MED ORDER — SODIUM CHLORIDE 0.9% FLUSH
3.0000 mL | Freq: Two times a day (BID) | INTRAVENOUS | Status: DC
  Administered 2023-04-01 – 2023-04-04 (×6): 3 mL via INTRAVENOUS

## 2023-04-01 MED ORDER — IPRATROPIUM-ALBUTEROL 0.5-2.5 (3) MG/3ML IN SOLN
3.0000 mL | Freq: Once | RESPIRATORY_TRACT | Status: AC
  Administered 2023-04-01: 3 mL via RESPIRATORY_TRACT

## 2023-04-01 MED ORDER — VITAMIN D 25 MCG (1000 UNIT) PO TABS
1000.0000 [IU] | ORAL_TABLET | Freq: Every day | ORAL | Status: DC
  Administered 2023-04-02 – 2023-04-04 (×3): 1000 [IU] via ORAL
  Filled 2023-04-01 (×3): qty 1

## 2023-04-01 MED ORDER — DIVALPROEX SODIUM ER 250 MG PO TB24
250.0000 mg | ORAL_TABLET | Freq: Every day | ORAL | Status: DC
  Administered 2023-04-02 – 2023-04-03 (×2): 250 mg via ORAL
  Filled 2023-04-01 (×6): qty 1

## 2023-04-01 MED ORDER — IPRATROPIUM-ALBUTEROL 0.5-2.5 (3) MG/3ML IN SOLN
3.0000 mL | Freq: Three times a day (TID) | RESPIRATORY_TRACT | Status: DC
  Administered 2023-04-01: 3 mL via RESPIRATORY_TRACT
  Filled 2023-04-01: qty 3

## 2023-04-01 MED ORDER — ACETAMINOPHEN 325 MG PO TABS: 650.0000 mg | ORAL_TABLET | Freq: Four times a day (QID) | ORAL | Status: DC | PRN

## 2023-04-01 MED ORDER — ACETYLCYSTEINE 20 % IN SOLN
4.0000 mL | Freq: Three times a day (TID) | RESPIRATORY_TRACT | Status: DC
  Administered 2023-04-02 – 2023-04-04 (×7): 4 mL via RESPIRATORY_TRACT
  Filled 2023-04-01 (×8): qty 4

## 2023-04-01 MED ORDER — ALBUTEROL SULFATE (2.5 MG/3ML) 0.083% IN NEBU: 2.5000 mg | INHALATION_SOLUTION | Freq: Four times a day (QID) | RESPIRATORY_TRACT | Status: DC | PRN

## 2023-04-01 MED ORDER — ENOXAPARIN SODIUM 40 MG/0.4ML IJ SOSY: 40.0000 mg | PREFILLED_SYRINGE | INTRAMUSCULAR | Status: DC

## 2023-04-01 MED ORDER — METHYLPREDNISOLONE SODIUM SUCC 125 MG IJ SOLR
125.0000 mg | Freq: Once | INTRAMUSCULAR | Status: AC
  Administered 2023-04-01: 125 mg via INTRAVENOUS
  Filled 2023-04-01: qty 2

## 2023-04-01 MED ORDER — LACTATED RINGERS IV BOLUS
500.0000 mL | Freq: Once | INTRAVENOUS | Status: AC
  Administered 2023-04-01: 500 mL via INTRAVENOUS

## 2023-04-01 MED ORDER — LINACLOTIDE 72 MCG PO CAPS
72.0000 ug | ORAL_CAPSULE | Freq: Every day | ORAL | Status: DC
  Administered 2023-04-02 – 2023-04-04 (×2): 72 ug via ORAL
  Filled 2023-04-01 (×6): qty 1

## 2023-04-01 MED ORDER — ALBUTEROL SULFATE HFA 108 (90 BASE) MCG/ACT IN AERS: 2.0000 | INHALATION_SPRAY | RESPIRATORY_TRACT | Status: DC | PRN

## 2023-04-01 MED ORDER — ALBUTEROL SULFATE (2.5 MG/3ML) 0.083% IN NEBU
2.5000 mg | INHALATION_SOLUTION | Freq: Three times a day (TID) | RESPIRATORY_TRACT | Status: DC
  Administered 2023-04-02 – 2023-04-03 (×4): 2.5 mg via RESPIRATORY_TRACT
  Filled 2023-04-01 (×4): qty 3

## 2023-04-01 MED ORDER — DULOXETINE HCL 30 MG PO CPEP
30.0000 mg | ORAL_CAPSULE | Freq: Every day | ORAL | Status: DC
  Administered 2023-04-02 – 2023-04-04 (×3): 30 mg via ORAL
  Filled 2023-04-01 (×3): qty 1

## 2023-04-01 MED ORDER — ROSUVASTATIN CALCIUM 20 MG PO TABS: 20.0000 mg | ORAL_TABLET | Freq: Every day | ORAL | Status: DC

## 2023-04-01 MED ORDER — FENOFIBRATE 54 MG PO TABS
54.0000 mg | ORAL_TABLET | Freq: Every day | ORAL | Status: DC
  Administered 2023-04-02 – 2023-04-04 (×3): 54 mg via ORAL
  Filled 2023-04-01 (×6): qty 1

## 2023-04-01 MED ORDER — SODIUM CHLORIDE 0.9 % IV SOLN
2.0000 g | Freq: Once | INTRAVENOUS | Status: AC
  Administered 2023-04-01: 2 g via INTRAVENOUS
  Filled 2023-04-01: qty 20

## 2023-04-01 MED ORDER — METHYLPREDNISOLONE SODIUM SUCC 125 MG IJ SOLR
60.0000 mg | Freq: Once | INTRAMUSCULAR | Status: AC
  Administered 2023-04-02: 60 mg via INTRAVENOUS
  Filled 2023-04-01: qty 2

## 2023-04-01 MED ORDER — QUETIAPINE FUMARATE 25 MG PO TABS
50.0000 mg | ORAL_TABLET | Freq: Two times a day (BID) | ORAL | Status: DC
  Administered 2023-04-01 – 2023-04-04 (×6): 50 mg via ORAL
  Filled 2023-04-01 (×6): qty 2

## 2023-04-01 MED ORDER — IPRATROPIUM-ALBUTEROL 0.5-2.5 (3) MG/3ML IN SOLN: 3.0000 mL | Freq: Three times a day (TID) | RESPIRATORY_TRACT | Status: DC

## 2023-04-01 MED ORDER — SODIUM CHLORIDE 0.9 % IV SOLN
2.0000 g | INTRAVENOUS | Status: DC
  Administered 2023-04-02 – 2023-04-04 (×3): 2 g via INTRAVENOUS
  Filled 2023-04-01 (×3): qty 20

## 2023-04-01 MED ORDER — MIDODRINE HCL 5 MG PO TABS
2.5000 mg | ORAL_TABLET | Freq: Three times a day (TID) | ORAL | Status: DC
  Administered 2023-04-02 – 2023-04-04 (×8): 2.5 mg via ORAL
  Filled 2023-04-01 (×8): qty 1

## 2023-04-01 MED ORDER — GUAIFENESIN-DM 100-10 MG/5ML PO SYRP: 5.0000 mL | ORAL_SOLUTION | ORAL | Status: DC | PRN

## 2023-04-01 MED ORDER — SODIUM CHLORIDE 0.9 % IV SOLN
500.0000 mg | INTRAVENOUS | Status: DC
  Administered 2023-04-01: 500 mg via INTRAVENOUS
  Filled 2023-04-01: qty 5

## 2023-04-01 NOTE — Assessment & Plan Note (Addendum)
7 Days of cough and minimal clear sputum now culminating in shortness of breath and noted hypoxemia with chest x-ray finding of infiltrate.  I reviewed images and this is clearly seen.  I will check a viral panel, continue treatment with ceftriaxone and azithromycin started in the ER.  Patient did not receive blood culture drawn prior to first dose of antibiotic.  Given lack of fever, non toxic state, I think cultures at this stage would be nonproductive.  Of is a major complaint for the patient therefore I have ordered Mucomyst nebulization as well as cough medication for patient orally.  ER report advised that the patient was having wheezing on initial evaluation, patient received methylprednisolone in the ER 125 mg.  I will continue with inhaled bronchodilator therapy and 60 mg prednisone daily.  However note patient does not have diagnosis of COPD and in general I would favor that if patient shows excellent clinical improvement as she has been doing thus far, we can probably stop the steroids

## 2023-04-01 NOTE — ED Triage Notes (Signed)
Pt arrived vvia [facility] from [facility] for c/o SOB x 1 week. Audible wheezing heard when talking. RA pt is 88%-91%. 2 lpm nasal cannula applied. Pt states she has been congested and coughing. Pt verbalized yellow phelm.

## 2023-04-01 NOTE — Assessment & Plan Note (Addendum)
likely represents a very mild AKI due to hypoxemia.  Patient is tolerating p.o. diet.  We will check urinalysis sodium creatinine.  Trend. 500 cc LR bolus. Resume lasix once acute illness resolved.

## 2023-04-01 NOTE — ED Notes (Signed)
Daughter notified of pt update and daughter requesting update phone calls due to she is POA. Nurse advised daughter to give AP a copy of her POA.

## 2023-04-01 NOTE — Assessment & Plan Note (Signed)
Is chronic, moderate, asymptomatic.  Patient is pending cortisol TSH and osmolality.

## 2023-04-01 NOTE — ED Provider Notes (Signed)
[place] EMERGENCY DEPARTMENT AT [facility] Provider Note  CSN: [id number] Arrival date & time: 04/01/23 1204  Chief Complaint(s) Shortness of Breath  HPI Virginia Young is a 82 y.o. female history of atrial fibrillation on Eliquis, CHF, hyperlipidemia presenting from assisted living facility with shortness of breath.  Reports symptoms for 1 week.  Reports associated cough with yellow phlegm.  She also reports some mild runny nose and sore throat.  Some chills, has not noticed fevers.  No chest pain, back pain, abdominal pain, nausea or vomiting, leg swelling.  Does not use any inhalers or oxygen at home.  Unknown sick contacts.   Past Medical History Past Medical History:  Diagnosis Date   Allergy    Atrial fibrillation (HCC)    coumadin   Cataract    Chest pain 03/2012   a. Lex MV 10/13:  EF 64%, dist ant and apical defect sugg of soft tissue atten, no ischemia   Chronic systolic heart failure (HCC)    Depression    GERD (gastroesophageal reflux disease)    HLD (hyperlipidemia)    Mild mitral regurgitation    MS (multiple sclerosis) (HCC)    NICM (nonischemic cardiomyopathy) (HCC)    a. neg CLite in 2003;  b. EF 40-45% in past;   c.  Echo 12/11: EF 35-40%, mild MR, mild LAE, mild RAE, small pericardial effusion    Osteoarthritis    Osteoporosis    Stasis ulcer (HCC)    Vaginal prolapse without uterine prolapse    Varicose veins    Patient Active Problem List   Diagnosis Date Noted   Pneumonia 04/01/2023   Syncope 09/16/2020   Hypokalemia 09/16/2020   Hypoalbuminemia 09/16/2020   Abrasion of right arm 09/16/2020   Right sided weakness 09/16/2020   Accidental fall 09/16/2020   Syncope and collapse 09/16/2020   Acute CVA (cerebrovascular accident) (HCC) 09/15/2020   Pyometra due to chronic inflammatory disease of uterus 02/18/2020   Postmenopausal 08/11/2019   Vitamin D deficiency 02/17/2019   Constipation 02/17/2019   Witnessed seizure-like activity  (HCC) 02/03/2019   Chronic systolic HF (heart failure) (HCC) 10/08/2018   Educated about COVID-19 virus infection 10/08/2018   Dementia (HCC) 02/10/2018   S/P shoulder replacement, right 08/22/2017   Essential hypertension 02/19/2016   Voiding dysfunction 10/07/2014   SUI (stress urinary incontinence, female) 10/07/2014   Pathological fracture due to osteoporosis 04/06/2014   DOE (dyspnea on exertion) 07/23/2013   Helicobacter positive gastritis 06/21/2013   Orthostatic hypotension 06/10/2013   Recurrent UTI 05/06/2013   Neck mass 02/19/2013   Right groin mass 11/08/2011   Low back pain 11/08/2011   Lipoma of neck 06/21/2011   OVERACTIVE BLADDER 08/21/2010   Long term current use of anticoagulant 07/25/2010   Osteoarthritis 01/18/2010   KNEE PAIN 01/18/2010   FOOT PAIN 01/18/2010   Mixed hyperlipidemia 04/20/2009   GERD 04/17/2009   MITRAL REGURGITATION, MILD 08/30/2008   NICM (nonischemic cardiomyopathy) (HCC) 08/30/2008   Depression, major, single episode, moderate (HCC) 04/10/2007   DISEASE, MITRAL VALVE NEC/NOS 04/10/2007   Atrial fibrillation (HCC) 04/10/2007   STASIS ULCER 04/10/2007   VENOUS INSUFFICIENCY, LEGS 04/10/2007   Home Medication(s) Prior to Admission medications   Medication Sig Start Date End Date Taking? Authorizing Provider  allopurinol (ZYLOPRIM) 100 MG tablet Take 100 mg by mouth daily.    [provider]  Cholecalciferol (D3-1000 PO) Take 1,000 Units by mouth daily. Patient not taking: Reported on 10/17/2021    [provider]  diclofenac Sodium (VOLTAREN) 1 % GEL Apply 4 g topically 4 (four) times daily. Patient not taking: Reported on 04/11/2021 09/23/20   Rhetta Mura, MD  divalproex (DEPAKOTE ER) 250 MG 24 hr tablet Take 1 tablet (250 mg total) by mouth daily. 09/23/20   Rhetta Mura, MD  docusate sodium (COLACE) 100 MG capsule Take 100 mg by mouth daily.    [provider]  DULoxetine (CYMBALTA) 30 MG capsule  Take 1 capsule (30 mg total) by mouth daily. 09/23/20   Rhetta Mura, MD  ELIQUIS 5 MG TABS tablet TAKE 1 TABLET BY MOUTH EVERY 12 HOURS 11/16/20   Hawks, Neysa Bonito A, FNP  fenofibrate (TRICOR) 48 MG tablet Take 48 mg by mouth daily. 09/25/21   [provider]  folic acid (FOLVITE) 1 MG tablet Take 1 mg by mouth daily. Patient not taking: Reported on 10/17/2021    [provider]  furosemide (LASIX) 40 MG tablet Take 40 mg by mouth daily. 09/25/21   [provider]  hydrOXYzine (ATARAX) 25 MG tablet Take 25 mg by mouth 3 (three) times daily as needed. Patient not taking: Reported on 10/17/2021    [provider]  linaclotide Karlene Einstein) 72 MCG capsule Take 1 capsule (72 mcg total) by mouth daily before breakfast. 01/19/20   Junie Spencer, FNP  memantine (NAMENDA) 5 MG tablet Take 1 tablet (5 mg total) by mouth 2 (two) times daily. 09/23/20   Rhetta Mura, MD  metFORMIN (GLUCOPHAGE) 500 MG tablet Take 500 mg by mouth daily.    [provider]  metoprolol tartrate (LOPRESSOR) 25 MG tablet Take 0.5 tablets (12.5 mg total) by mouth 3 (three) times daily. 09/23/20   Rhetta Mura, MD  midodrine (PROAMATINE) 2.5 MG tablet Take 1 tablet (2.5 mg total) by mouth 3 (three) times daily with meals. 04/11/21   Rollene Rotunda, MD  Multiple Vitamin (MULTIVITAMIN) tablet Take 1 tablet by mouth daily.    [provider]  ondansetron (ZOFRAN) 4 MG tablet Take 1 tablet (4 mg total) by mouth every 6 (six) hours. Patient not taking: Reported on 10/17/2021 06/22/21   Carroll Sage, PA-C  OXYGEN Inhale into the lungs. 2 liters PRN - anytime when needed ( cardio rx'd)    [provider]  pantoprazole (PROTONIX) 40 MG tablet Take 1 tablet (40 mg total) by mouth 2 (two) times daily. 09/23/20   Rhetta Mura, MD  potassium chloride SA (KLOR-CON M) 20 MEQ tablet Take 20 mEq by mouth daily. 09/25/21   [provider]  QUEtiapine  (SEROQUEL) 50 MG tablet Take 50 mg by mouth 2 (two) times daily.    [provider]  rosuvastatin (CRESTOR) 20 MG tablet Take 20 mg by mouth daily. 09/25/21   [provider]  traMADol (ULTRAM) 50 MG tablet Take 50 mg by mouth every 12 (twelve) hours as needed. Patient not taking: Reported on 04/11/2021    [provider]  Past Surgical History Past Surgical History:  Procedure Laterality Date   ANTERIOR AND POSTERIOR VAGINAL REPAIR W/ SACROSPINOUS LIGAMENT SUSPENSION  2016   [facility]   BLADDER SURGERY     CARPAL TUNNEL RELEASE Right 08/22/2017   Procedure: CARPAL TUNNEL RELEASE;  Surgeon: Beverely Low, MD;  Location: [facility] OR;  Service: Orthopedics;  Laterality: Right;   cataract surgery Bilateral    EYE SURGERY     INGUINAL HERNIA REPAIR     right   LIPOMA EXCISION     h/o removed from upper back x 2   REVERSE SHOULDER ARTHROPLASTY Right 08/22/2017   Procedure: REVERSE SHOULDER ARTHROPLASTY;  Surgeon: Beverely Low, MD;  Location[facility];  Service: Orthopedics;  Laterality: Right;   Family History Family History  Problem Relation Age of Onset   Diabetes Father 13       diabetes and syncope   Heart attack Father    Heart attack Sister    Heart attack Brother        all 5 brothers had MIs   Stroke Brother    Cancer Sister        bone   Diabetes Sister    Cancer Sister    Diabetes Sister    Heart attack Brother    Dementia Mother    Dementia Brother    CAD Other    Diabetes Daughter    Colon cancer Neg Hx    Neuropathy Neg Hx     Social History Social History   Tobacco Use   Smoking status: Never    Passive exposure: Never   Smokeless tobacco: Never   Tobacco comments:    Verified by Daughter, Dimas [place]  Vaping Use   Vaping status: Never Used  Substance Use Topics   Alcohol use: No   Drug use: No    Allergies Bactrim [sulfamethoxazole-trimethoprim], Lipitor [atorvastatin calcium], and Lisinopril  Review of Systems Review of Systems  All other systems reviewed and are negative.   Physical Exam Vital Signs  I have reviewed the triage vital signs BP 118/66   Pulse 90   Temp 98.2 F (36.8 C) (Oral)   Resp 19   Ht 5\' 11"  (1.803 m)   Wt 110.1 kg   SpO2 94%   BMI 33.86 kg/m  Physical Exam Vitals and nursing note reviewed.  Constitutional:      General: She is not in acute distress.    Appearance: She is well-developed.  HENT:     Head: Normocephalic and atraumatic.     Mouth/Throat:     Mouth: Mucous membranes are moist.  Eyes:     Pupils: Pupils are equal, round, and reactive to light.  Cardiovascular:     Rate and Rhythm: Normal rate and regular rhythm.     Heart sounds: No murmur heard. Pulmonary:     Effort: Tachypnea and accessory muscle usage present. No respiratory distress.     Breath sounds: Examination of the right-upper field reveals wheezing. Examination of the left-upper field reveals wheezing. Examination of the right-middle field reveals wheezing. Examination of the left-middle field reveals wheezing. Examination of the right-lower field reveals wheezing and rhonchi. Examination of the left-lower field reveals wheezing and rhonchi. Wheezing and rhonchi present.  Abdominal:     General: Abdomen is flat.     Palpations: Abdomen is soft.     Tenderness: There is no abdominal tenderness.  Musculoskeletal:        General: No tenderness.     Right lower  leg: No edema.     Left lower leg: No edema.  Skin:    General: Skin is warm and dry.  Neurological:     General: No focal deficit present.     Mental Status: She is alert. Mental status is at baseline.  Psychiatric:        Mood and Affect: Mood normal.        Behavior: Behavior normal.     ED Results and Treatments Labs (all labs ordered are listed, but only abnormal results are displayed) Labs  Reviewed  COMPREHENSIVE METABOLIC PANEL - Abnormal; Notable for the following components:      Result Value   Sodium 128 (*)    Chloride 90 (*)    Glucose, Bld 123 (*)    Creatinine, Ser 1.08 (*)    Calcium 8.3 (*)    Albumin 3.0 (*)    GFR, Estimated 51 (*)    All other components within normal limits  CBC WITH DIFFERENTIAL/PLATELET - Abnormal; Notable for the following components:   WBC 13.4 (*)    Neutro Abs 8.5 (*)    Monocytes Absolute 1.7 (*)    Abs Immature Granulocytes 0.10 (*)    All other components within normal limits  BLOOD GAS, VENOUS - Abnormal; Notable for the following components:   pO2, Ven <31 (*)    Bicarbonate 34.2 (*)    Acid-Base Excess 7.9 (*)    All other components within normal limits  BRAIN NATRIURETIC PEPTIDE - Abnormal; Notable for the following components:   B Natriuretic Peptide 204.0 (*)    All other components within normal limits  SARS CORONAVIRUS 2 BY RT PCR  CBC  CREATININE, SERUM  OSMOLALITY  SODIUM, URINE, RANDOM                                                                                                                          Radiology DG Chest Port 1 View  Result Date: 04/01/2023 CLINICAL DATA:  Shortness of breath EXAM: PORTABLE CHEST 1 VIEW COMPARISON:  06/22/2021 FINDINGS: Film is rotated to the left. Slightly enlarged cardiopericardial silhouette with calcified and tortuous aorta. Lung base parenchymal opacities are seen, left-greater-than-right. Acute infiltrates possible. Recommend follow-up. No pneumothorax or edema. Overlapping cardiac leads. Right shoulder reverse arthroplasty. Chronic interstitial lung changes. Curvature and degenerative changes of the spine. IMPRESSION: Rotated radiograph with enlarged heart. Lung base parenchymal opacities are seen, left greater than right. Infiltrate is possible. Electronically Signed   By: Karen Kays M.D.   On: 04/01/2023 14:45    Pertinent labs & imaging results that were available  during my care of the patient were reviewed by me and considered in my medical decision making (see MDM for details).  Medications Ordered in ED Medications  azithromycin (ZITHROMAX) 500 mg in sodium chloride 0.9 % 250 mL IVPB (has no administration in time range)  cefTRIAXone (ROCEPHIN) 2 g in sodium chloride 0.9 % 100 mL IVPB (has no administration in  time range)  methylPREDNISolone sodium succinate (SOLU-MEDROL) 125 mg/2 mL injection 60 mg (has no administration in time range)  ipratropium-albuterol (DUONEB) 0.5-2.5 (3) MG/3ML nebulizer solution 3 mL (has no administration in time range)  enoxaparin (LOVENOX) injection 40 mg (has no administration in time range)  acetaminophen (TYLENOL) tablet 650 mg (has no administration in time range)    Or  acetaminophen (TYLENOL) suppository 650 mg (has no administration in time range)  polyethylene glycol (MIRALAX / GLYCOLAX) packet 17 g (has no administration in time range)  sodium chloride flush (NS) 0.9 % injection 3 mL (has no administration in time range)  ipratropium-albuterol (DUONEB) 0.5-2.5 (3) MG/3ML nebulizer solution 3 mL (3 mLs Nebulization Given 04/01/23 1414)  methylPREDNISolone sodium succinate (SOLU-MEDROL) 125 mg/2 mL injection 125 mg (125 mg Intravenous Given 04/01/23 1412)  cefTRIAXone (ROCEPHIN) 2 g in sodium chloride 0.9 % 100 mL IVPB (0 g Intravenous Stopped 04/01/23 1545)                                                                                                                                     Procedures .Critical Care  Performed by: Lonell Grandchild, MD Authorized by: Lonell Grandchild, MD   Critical care provider statement:    Critical care time (minutes):  30   Critical care was necessary to treat or prevent imminent or life-threatening deterioration of the following conditions:  Respiratory failure   Critical care was time spent personally by me on the following activities:  Development of treatment plan with  patient or surrogate, discussions with consultants, evaluation of patient's response to treatment, examination of patient, ordering and review of laboratory studies, ordering and review of radiographic studies, ordering and performing treatments and interventions, pulse oximetry, re-evaluation of patient's condition and review of old charts   (including critical care time)  Medical Decision Making / ED Course   MDM:  82 year old female presenting to the emergency department shortness of breath.  Patient overall well-appearing but has some mild increased work of breathing.  She has diffuse wheezing in all lung fields.  Patient denies history of COPD but given wheezing will treat for COPD as possible component with DuoNeb and steroid.  Given productive phlegm will check chest x-ray to evaluate for underlying pneumonia.  Patient is hypoxic.  She will likely require admission.  Doubt PE, no chest pain, patient is on chronic anticoagulation.  Patient does not appear clinically volume overloaded on exam.  Doubt pneumothorax.  Will reassess.  Anticipate admission given hypoxia.  Clinical Course as of 04/01/23 1601  Tue Apr 01, 2023  1444 COVID-negative labs notable for mild hyponatremia, leukocytosis, low pO2 and VBG without signs of severe CO2 retention.  BNP is slightly elevated but only very mildly.  She does feel better after receiving breathing treatment and steroids.  On my chest x-ray interpretation patient has some right lower lobe changes concerning for possible pneumonia.  Will  discuss with the hospitalist for admission. [WS]  1558 Patient admitted to Dr Maryjean Ka for pneumonia  [WS]    Clinical Course User Index [WS] Lonell Grandchild, MD     Additional history obtained: -Additional history obtained from ems -External records from outside source obtained and reviewed including: Chart review including previous notes, labs, imaging, consultation notes including previous notes    Lab  Tests: -I ordered, reviewed, and interpreted labs.   The pertinent results include:   Labs Reviewed  COMPREHENSIVE METABOLIC PANEL - Abnormal; Notable for the following components:      Result Value   Sodium 128 (*)    Chloride 90 (*)    Glucose, Bld 123 (*)    Creatinine, Ser 1.08 (*)    Calcium 8.3 (*)    Albumin 3.0 (*)    GFR, Estimated 51 (*)    All other components within normal limits  CBC WITH DIFFERENTIAL/PLATELET - Abnormal; Notable for the following components:   WBC 13.4 (*)    Neutro Abs 8.5 (*)    Monocytes Absolute 1.7 (*)    Abs Immature Granulocytes 0.10 (*)    All other components within normal limits  BLOOD GAS, VENOUS - Abnormal; Notable for the following components:   pO2, Ven <31 (*)    Bicarbonate 34.2 (*)    Acid-Base Excess 7.9 (*)    All other components within normal limits  BRAIN NATRIURETIC PEPTIDE - Abnormal; Notable for the following components:   B Natriuretic Peptide 204.0 (*)    All other components within normal limits  SARS CORONAVIRUS 2 BY RT PCR  CBC  CREATININE, SERUM  OSMOLALITY  SODIUM, URINE, RANDOM    Notable for see ED course for interpretations  EKG   EKG Interpretation Date/Time:  Tuesday April 01 2023 12:33:04 EDT Ventricular Rate:  91 PR Interval:    QRS Duration:  90 QT Interval:  353 QTC Calculation: 435 R Axis:   -35  Text Interpretation: Atrial fibrillation Left axis deviation Low voltage, precordial leads Consider anterior infarct Borderline repolarization abnormality Confirmed by Alvino Blood ([id number]) on 04/01/2023 1:36:48 PM         Imaging Studies ordered: I ordered imaging studies including CXR On my interpretation imaging demonstrates pneumonia  I independently visualized and interpreted imaging. I agree with the radiologist interpretation   Medicines ordered and prescription drug management: Meds ordered this encounter  Medications   DISCONTD: albuterol (VENTOLIN HFA) 108 (90 Base) MCG/ACT  inhaler 2 puff   ipratropium-albuterol (DUONEB) 0.5-2.5 (3) MG/3ML nebulizer solution 3 mL   methylPREDNISolone sodium succinate (SOLU-MEDROL) 125 mg/2 mL injection 125 mg   cefTRIAXone (ROCEPHIN) 2 g in sodium chloride 0.9 % 100 mL IVPB    Order Specific Question:   Antibiotic Indication:    Answer:   CAP   DISCONTD: azithromycin (ZITHROMAX) 500 mg in sodium chloride 0.9 % 250 mL IVPB    Order Specific Question:   Antibiotic Indication:    Answer:   CAP   azithromycin (ZITHROMAX) 500 mg in sodium chloride 0.9 % 250 mL IVPB    Order Specific Question:   Antibiotic Indication:    Answer:   CAP   cefTRIAXone (ROCEPHIN) 2 g in sodium chloride 0.9 % 100 mL IVPB    Order Specific Question:   Antibiotic Indication:    Answer:   CAP   methylPREDNISolone sodium succinate (SOLU-MEDROL) 125 mg/2 mL injection 60 mg   ipratropium-albuterol (DUONEB) 0.5-2.5 (3) MG/3ML nebulizer solution 3 mL  enoxaparin (LOVENOX) injection 40 mg   OR Linked Order Group    acetaminophen (TYLENOL) tablet 650 mg    acetaminophen (TYLENOL) suppository 650 mg   polyethylene glycol (MIRALAX / GLYCOLAX) packet 17 g   sodium chloride flush (NS) 0.9 % injection 3 mL    -I have reviewed the patients home medicines and have made adjustments as needed   Consultations Obtained: I requested consultation with the hospitalist,  and discussed lab and imaging findings as well as pertinent plan - they recommend: admission   Cardiac Monitoring: The patient was maintained on a cardiac monitor.  I personally viewed and interpreted the cardiac monitored which showed an underlying rhythm of: NSR  Social Determinants of Health:  Diagnosis or treatment significantly limited by social determinants of health: lives alone   Reevaluation: After the interventions noted above, I reevaluated the patient and found that their symptoms have improved  Co morbidities that complicate the patient evaluation  Past Medical History:  Diagnosis  Date   Allergy    Atrial fibrillation (HCC)    coumadin   Cataract    Chest pain 03/2012   a. Lex MV 10/13:  EF 64%, dist ant and apical defect sugg of soft tissue atten, no ischemia   Chronic systolic heart failure (HCC)    Depression    GERD (gastroesophageal reflux disease)    HLD (hyperlipidemia)    Mild mitral regurgitation    MS (multiple sclerosis) (HCC)    NICM (nonischemic cardiomyopathy) (HCC)    a. neg CLite in 2003;  b. EF 40-45% in past;   c.  Echo 12/11: EF 35-40%, mild MR, mild LAE, mild RAE, small pericardial effusion    Osteoarthritis    Osteoporosis    Stasis ulcer (HCC)    Vaginal prolapse without uterine prolapse    Varicose veins       Dispostion: Disposition decision including need for hospitalization was considered, and patient admitted to the hospital.    Final Clinical Impression(s) / ED Diagnoses Final diagnoses:  Acute hypoxic respiratory failure (HCC)  Community acquired pneumonia of right lower lobe of lung     This chart was dictated using voice recognition software.  Despite best efforts to proofread,  errors can occur which can change the documentation meaning.    Lonell Grandchild, MD 04/01/23 [phone]

## 2023-04-01 NOTE — Assessment & Plan Note (Signed)
Chronic diagnosis. Rate controlld.

## 2023-04-01 NOTE — H&P (Addendum)
History and Physical    Patient: Virginia Young:[id number] DOB: 07/05/1940 DOA: 04/01/2023 DOS: the patient was seen and examined on 04/01/2023 PCP: Galvin Proffer, MD  Patient coming from: SNF  Chief Complaint:  Chief Complaint  Patient presents with   Shortness of Breath   HPI: Virginia Young is a 82 y.o. female with medical history significant of had arthritis for which patient manages walking with walker.  Patient does not typically use supplementary oxygen at home.  Patient was in her usual state of health till about 7 days ago when she reports new onset of cough with scant clear sputum production.  Patient did not have any fever chest pain or leg swelling.  However patient has been feeling short of breath for the last 48 hours but especially today.  Prompting her to be brought to [facility], ER by EMS.  Report obtained indicates that the patient was hypoxic in the 80s with EMS.  Patient was stabilized on 2 L/min of supplementary oxygen on which she has been since then.  Patient does not report any complaints at this time besides the bothersome cough.  Which is producing very scant sputum.  Patient has been able to tolerate diet. Review of Systems: As mentioned in the history of present illness. All other systems reviewed and are negative. Past Medical History:  Diagnosis Date   Allergy    Atrial fibrillation (HCC)    coumadin   Cataract    Chest pain 03/2012   a. Lex MV 10/13:  EF 64%, dist ant and apical defect sugg of soft tissue atten, no ischemia   Chronic systolic heart failure (HCC)    Depression    GERD (gastroesophageal reflux disease)    HLD (hyperlipidemia)    Mild mitral regurgitation    MS (multiple sclerosis) (HCC)    NICM (nonischemic cardiomyopathy) (HCC)    a. neg CLite in 2003;  b. EF 40-45% in past;   c.  Echo 12/11: EF 35-40%, mild MR, mild LAE, mild RAE, small pericardial effusion    Osteoarthritis    Osteoporosis    Stasis ulcer (HCC)     Vaginal prolapse without uterine prolapse    Varicose veins    Past Surgical History:  Procedure Laterality Date   ANTERIOR AND POSTERIOR VAGINAL REPAIR W/ SACROSPINOUS LIGAMENT SUSPENSION  2016   [facility]   BLADDER SURGERY     CARPAL TUNNEL RELEASE Right 08/22/2017   Procedure: CARPAL TUNNEL RELEASE;  Surgeon: Beverely Low, MD;  Location: [facility] OR;  Service: Orthopedics;  Laterality: Right;   cataract surgery Bilateral    EYE SURGERY     INGUINAL HERNIA REPAIR     right   LIPOMA EXCISION     h/o removed from upper back x 2   REVERSE SHOULDER ARTHROPLASTY Right 08/22/2017   Procedure: REVERSE SHOULDER ARTHROPLASTY;  Surgeon: Beverely Low, MD;  Location: [facility] OR;  Service: Orthopedics;  Laterality: Right;   Social History:  reports that she has never smoked. She has never been exposed to tobacco smoke. She has never used smokeless tobacco. She reports that she does not drink alcohol and does not use drugs.  Allergies  Allergen Reactions   Bactrim [Sulfamethoxazole-Trimethoprim]     Mouth sores   Lipitor [Atorvastatin Calcium]     myalgia   Lisinopril     cough    Family History  Problem Relation Age of Onset   Diabetes Father 52       diabetes and  syncope   Heart attack Father    Heart attack Sister    Heart attack Brother        all 5 brothers had MIs   Stroke Brother    Cancer Sister        bone   Diabetes Sister    Cancer Sister    Diabetes Sister    Heart attack Brother    Dementia Mother    Dementia Brother    CAD Other    Diabetes Daughter    Colon cancer Neg Hx    Neuropathy Neg Hx     Prior to Admission medications   Medication Sig Start Date End Date Taking? Authorizing Provider  allopurinol (ZYLOPRIM) 100 MG tablet Take 100 mg by mouth daily.    [provider]  Cholecalciferol (D3-1000 PO) Take 1,000 Units by mouth daily. Patient not taking: Reported on 10/17/2021    [provider]  diclofenac Sodium (VOLTAREN) 1 % GEL Apply 4 g topically 4  (four) times daily. Patient not taking: Reported on 04/11/2021 09/23/20   Rhetta Mura, MD  divalproex (DEPAKOTE ER) 250 MG 24 hr tablet Take 1 tablet (250 mg total) by mouth daily. 09/23/20   Rhetta Mura, MD  docusate sodium (COLACE) 100 MG capsule Take 100 mg by mouth daily.    [provider]  DULoxetine (CYMBALTA) 30 MG capsule Take 1 capsule (30 mg total) by mouth daily. 09/23/20   Rhetta Mura, MD  ELIQUIS 5 MG TABS tablet TAKE 1 TABLET BY MOUTH EVERY 12 HOURS 11/16/20   Hawks, Neysa Bonito A, FNP  fenofibrate (TRICOR) 48 MG tablet Take 48 mg by mouth daily. 09/25/21   [provider]  folic acid (FOLVITE) 1 MG tablet Take 1 mg by mouth daily. Patient not taking: Reported on 10/17/2021    [provider]  furosemide (LASIX) 40 MG tablet Take 40 mg by mouth daily. 09/25/21   [provider]  hydrOXYzine (ATARAX) 25 MG tablet Take 25 mg by mouth 3 (three) times daily as needed. Patient not taking: Reported on 10/17/2021    [provider]  linaclotide Karlene Einstein) 72 MCG capsule Take 1 capsule (72 mcg total) by mouth daily before breakfast. 01/19/20   Junie Spencer, FNP  memantine (NAMENDA) 5 MG tablet Take 1 tablet (5 mg total) by mouth 2 (two) times daily. 09/23/20   Rhetta Mura, MD  metFORMIN (GLUCOPHAGE) 500 MG tablet Take 500 mg by mouth daily.    [provider]  metoprolol tartrate (LOPRESSOR) 25 MG tablet Take 0.5 tablets (12.5 mg total) by mouth 3 (three) times daily. 09/23/20   Rhetta Mura, MD  midodrine (PROAMATINE) 2.5 MG tablet Take 1 tablet (2.5 mg total) by mouth 3 (three) times daily with meals. 04/11/21   Rollene Rotunda, MD  Multiple Vitamin (MULTIVITAMIN) tablet Take 1 tablet by mouth daily.    [provider]  ondansetron (ZOFRAN) 4 MG tablet Take 1 tablet (4 mg total) by mouth every 6 (six) hours. Patient not taking: Reported on 10/17/2021 06/22/21   Carroll Sage, PA-C  OXYGEN Inhale  into the lungs. 2 liters PRN - anytime when needed ( cardio rx'd)    [provider]  pantoprazole (PROTONIX) 40 MG tablet Take 1 tablet (40 mg total) by mouth 2 (two) times daily. 09/23/20   Rhetta Mura, MD  potassium chloride SA (KLOR-CON M) 20 MEQ tablet Take 20 mEq by mouth daily. 09/25/21   [provider]  QUEtiapine (SEROQUEL) 50 MG  tablet Take 50 mg by mouth 2 (two) times daily.    [provider]  rosuvastatin (CRESTOR) 20 MG tablet Take 20 mg by mouth daily. 09/25/21   [provider]  traMADol (ULTRAM) 50 MG tablet Take 50 mg by mouth every 12 (twelve) hours as needed. Patient not taking: Reported on 04/11/2021    [provider]    Physical Exam: Vitals:   04/01/23 1545 04/01/23 1621 04/01/23 1637 04/01/23 2010  BP: 112/61  (!) 144/84   Pulse: 97 99 (!) 106   Resp: 19 18 20    Temp: 98 F (36.7 C)  97.6 F (36.4 C)   TempSrc:   Oral   SpO2: 94% 93% 95% 95%  Weight:   108 kg   Height:   5\' 11"  (1.803 m)    General: Patient is alert and awake and giving a fairly coherent account of her symptoms.  Does not appear to be distressed Respiratory exam: Bilateral air entry vesicular Cardiovascular exam S1-S2 normal Abdomen all quadrants are soft nontender Extremities warm without edema No focal motor deficit, however patient has right knee discomfort and hip discomfort during range of motion testing. No concern of septic arthritis. Data Reviewed:  Labs on Admission:  Results for orders placed or performed during the hospital encounter of 04/01/23 (from the past 24 hour(s))  SARS Coronavirus 2 by RT PCR (hospital order, performed in [facility] hospital lab) *cepheid single result test* Anterior Nasal Swab     Status: None   Collection Time: 04/01/23 12:30 PM   Specimen: Anterior Nasal Swab  Result Value Ref Range   SARS Coronavirus 2 by RT PCR NEGATIVE NEGATIVE  Comprehensive metabolic panel     Status: Abnormal   Collection  Time: 04/01/23  1:53 PM  Result Value Ref Range   Sodium 128 (L) 135 - 145 mmol/L   Potassium 4.0 3.5 - 5.1 mmol/L   Chloride 90 (L) 98 - 111 mmol/L   CO2 28 22 - 32 mmol/L   Glucose, Bld 123 (H) 70 - 99 mg/dL   BUN 12 8 - 23 mg/dL   Creatinine, Ser 1.61 (H) 0.44 - 1.00 mg/dL   Calcium 8.3 (L) 8.9 - 10.3 mg/dL   Total Protein 6.6 6.5 - 8.1 g/dL   Albumin 3.0 (L) 3.5 - 5.0 g/dL   AST 20 15 - 41 U/L   ALT 13 0 - 44 U/L   Alkaline Phosphatase 63 38 - 126 U/L   Total Bilirubin 0.6 0.3 - 1.2 mg/dL   GFR, Estimated 51 (L) >60 mL/min   Anion gap 10 5 - 15  CBC with Differential     Status: Abnormal   Collection Time: 04/01/23  1:53 PM  Result Value Ref Range   WBC 13.4 (H) 4.0 - 10.5 K/uL   RBC 4.31 3.87 - 5.11 MIL/uL   Hemoglobin 12.3 12.0 - 15.0 g/dL   HCT 09.6 04.5 - 40.9 %   MCV 87.5 80.0 - 100.0 fL   MCH 28.5 26.0 - 34.0 pg   MCHC 32.6 30.0 - 36.0 g/dL   RDW 81.1 91.4 - 78.2 %   Platelets 204 150 - 400 K/uL   nRBC 0.0 0.0 - 0.2 %   Neutrophils Relative % 64 %   Neutro Abs 8.5 (H) 1.7 - 7.7 K/uL   Lymphocytes Relative 20 %   Lymphs Abs 2.7 0.7 - 4.0 K/uL   Monocytes Relative 13 %   Monocytes Absolute 1.7 (H) 0.1 - 1.0  K/uL   Eosinophils Relative 2 %   Eosinophils Absolute 0.3 0.0 - 0.5 K/uL   Basophils Relative 0 %   Basophils Absolute 0.1 0.0 - 0.1 K/uL   Immature Granulocytes 1 %   Abs Immature Granulocytes 0.10 (H) 0.00 - 0.07 K/uL  Blood gas, venous     Status: Abnormal   Collection Time: 04/01/23  1:53 PM  Result Value Ref Range   pH, Ven 7.41 7.25 - 7.43   pCO2, Ven 54 44 - 60 mmHg   pO2, Ven <31 (LL) 32 - 45 mmHg   Bicarbonate 34.2 (H) 20.0 - 28.0 mmol/L   Acid-Base Excess 7.9 (H) 0.0 - 2.0 mmol/L   O2 Saturation 25.9 %   Patient temperature 36.8    Collection site LEFT ANTECUBITAL    Drawn by [id number]   Brain natriuretic peptide     Status: Abnormal   Collection Time: 04/01/23  1:53 PM  Result Value Ref Range   B Natriuretic Peptide 204.0 (H) 0.0 - 100.0  pg/mL   Basic Metabolic Panel: Recent Labs  Lab 04/01/23 1353  NA 128*  K 4.0  CL 90*  CO2 28  GLUCOSE 123*  BUN 12  CREATININE 1.08*  CALCIUM 8.3*   Liver Function Tests: Recent Labs  Lab 04/01/23 1353  AST 20  ALT 13  ALKPHOS 63  BILITOT 0.6  PROT 6.6  ALBUMIN 3.0*   No results for input(s): "LIPASE", "AMYLASE" in the last 168 hours. No results for input(s): "AMMONIA" in the last 168 hours. CBC: Recent Labs  Lab 04/01/23 1353  WBC 13.4*  NEUTROABS 8.5*  HGB 12.3  HCT 37.7  MCV 87.5  PLT 204   Cardiac Enzymes: No results for input(s): "CKTOTAL", "CKMB", "CKMBINDEX", "TROPONINIHS" in the last 168 hours.  BNP (last 3 results) No results for input(s): "PROBNP" in the last 8760 hours. CBG: No results for input(s): "GLUCAP" in the last 168 hours.  Radiological Exams on Admission:  DG Chest Port 1 View  Result Date: 04/01/2023 CLINICAL DATA:  Shortness of breath EXAM: PORTABLE CHEST 1 VIEW COMPARISON:  06/22/2021 FINDINGS: Film is rotated to the left. Slightly enlarged cardiopericardial silhouette with calcified and tortuous aorta. Lung base parenchymal opacities are seen, left-greater-than-right. Acute infiltrates possible. Recommend follow-up. No pneumothorax or edema. Overlapping cardiac leads. Right shoulder reverse arthroplasty. Chronic interstitial lung changes. Curvature and degenerative changes of the spine. IMPRESSION: Rotated radiograph with enlarged heart. Lung base parenchymal opacities are seen, left greater than right. Infiltrate is possible. Electronically Signed   By: Karen Kays M.D.   On: 04/01/2023 14:45    EKG: Independently reviewed. Afib.   Assessment and Plan: * Pneumonia 7 Days of cough and minimal clear sputum now culminating in shortness of breath and noted hypoxemia with chest x-ray finding of infiltrate.  I reviewed images and this is clearly seen.  I will check a viral panel, continue treatment with ceftriaxone and azithromycin  started in the ER.  Patient did not receive blood culture drawn prior to first dose of antibiotic.  Given lack of fever, non toxic state, I think cultures at this stage would be nonproductive.  Of is a major complaint for the patient therefore I have ordered Mucomyst nebulization as well as cough medication for patient orally.  ER report advised that the patient was having wheezing on initial evaluation, patient received methylprednisolone in the ER 125 mg.  I will continue with inhaled bronchodilator therapy and 60 mg prednisone daily.  However note patient  does not have diagnosis of COPD and in general I would favor that if patient shows excellent clinical improvement as she has been doing thus far, we can probably stop the steroids  Elevated serum creatinine likely represents a very mild AKI due to hypoxemia.  Patient is tolerating p.o. diet.  We will check urinalysis sodium creatinine.  Trend. 500 cc LR bolus.  Hyponatremia Is chronic, moderate, asymptomatic.  Patient is pending cortisol TSH and osmolality.  Atrial fibrillation (HCC) Chronic diagnosis. Rate controlld.   Pharmacy rview of home meds pending   Advance Care Planning:   Code Status: Full Code   Consults: none  Family Communication: per patient.  Severity of Illness: The appropriate patient status for this patient is INPATIENT. Inpatient status is judged to be reasonable and necessary in order to provide the required intensity of service to ensure the patient's safety. The patient's presenting symptoms, physical exam findings, and initial radiographic and laboratory data in the context of their chronic comorbidities is felt to place them at high risk for further clinical deterioration. Furthermore, it is not anticipated that the patient will be medically stable for discharge from the hospital within 2 midnights of admission.   * I certify that at the point of admission it is my clinical judgment that the patient will require  inpatient hospital care spanning beyond 2 midnights from the point of admission due to high intensity of service, high risk for further deterioration and high frequency of surveillance required.*  Author: Nolberto Hanlon, MD 04/01/2023 9:01 PM  For on call review [email or web address].

## 2023-04-01 NOTE — Plan of Care (Signed)

## 2023-04-02 DIAGNOSIS — I4821 Permanent atrial fibrillation: Secondary | ICD-10-CM | POA: Diagnosis not present

## 2023-04-02 DIAGNOSIS — E871 Hypo-osmolality and hyponatremia: Secondary | ICD-10-CM | POA: Diagnosis not present

## 2023-04-02 DIAGNOSIS — N179 Acute kidney failure, unspecified: Secondary | ICD-10-CM | POA: Diagnosis not present

## 2023-04-02 DIAGNOSIS — J189 Pneumonia, unspecified organism: Secondary | ICD-10-CM | POA: Diagnosis not present

## 2023-04-02 LAB — URINALYSIS, COMPLETE (UACMP) WITH MICROSCOPIC
Bilirubin Urine: NEGATIVE
Glucose, UA: NEGATIVE mg/dL
Hgb urine dipstick: NEGATIVE
Ketones, ur: NEGATIVE mg/dL
Nitrite: NEGATIVE
Protein, ur: NEGATIVE mg/dL
Specific Gravity, Urine: 1.011 (ref 1.005–1.030)
pH: 6 (ref 5.0–8.0)

## 2023-04-02 LAB — CREATININE, URINE, RANDOM: Creatinine, Urine: 45 mg/dL

## 2023-04-02 LAB — BASIC METABOLIC PANEL
Anion gap: 9 (ref 5–15)
BUN: 12 mg/dL (ref 8–23)
CO2: 29 mmol/L (ref 22–32)
Calcium: 8.3 mg/dL — ABNORMAL LOW (ref 8.9–10.3)
Chloride: 89 mmol/L — ABNORMAL LOW (ref 98–111)
Creatinine, Ser: 0.95 mg/dL (ref 0.44–1.00)
GFR, Estimated: 59 mL/min — ABNORMAL LOW (ref >60)
Glucose, Bld: 149 mg/dL — ABNORMAL HIGH (ref 70–99)
Potassium: 4.3 mmol/L (ref 3.5–5.1)
Sodium: 127 mmol/L — ABNORMAL LOW (ref 135–145)

## 2023-04-02 LAB — CBC
HCT: 36.4 % (ref 36.0–46.0)
Hemoglobin: 12 g/dL (ref 12.0–15.0)
MCH: 28.4 pg (ref 26.0–34.0)
MCHC: 33 g/dL (ref 30.0–36.0)
MCV: 86.3 fL (ref 80.0–100.0)
Platelets: 219 10*3/uL (ref 150–400)
RBC: 4.22 MIL/uL (ref 3.87–5.11)
RDW: 13.9 % (ref 11.5–15.5)
WBC: 11.4 10*3/uL — ABNORMAL HIGH (ref 4.0–10.5)
nRBC: 0 % (ref 0.0–0.2)

## 2023-04-02 LAB — PROTIME-INR
INR: 1.5 — ABNORMAL HIGH (ref 0.8–1.2)
Prothrombin Time: 18.8 s — ABNORMAL HIGH (ref 11.4–15.2)

## 2023-04-02 LAB — TSH: TSH: 0.437 u[IU]/mL (ref 0.350–4.500)

## 2023-04-02 LAB — APTT: aPTT: 42 s — ABNORMAL HIGH (ref 24–36)

## 2023-04-02 LAB — CORTISOL: Cortisol, Plasma: 3.7 ug/dL

## 2023-04-02 LAB — SODIUM, URINE, RANDOM: Sodium, Ur: 38 mmol/L

## 2023-04-02 MED ORDER — ORAL CARE MOUTH RINSE: 15.0000 mL | OROMUCOSAL | Status: DC | PRN

## 2023-04-02 MED ORDER — PREDNISONE 20 MG PO TABS
40.0000 mg | ORAL_TABLET | Freq: Every day | ORAL | Status: DC
  Administered 2023-04-03 – 2023-04-04 (×2): 40 mg via ORAL
  Filled 2023-04-02 (×2): qty 2

## 2023-04-02 NOTE — Progress Notes (Signed)
PROGRESS NOTE   Virginia Young  QIO:[id number] DOB: 1941-03-20 DOA: 04/01/2023 PCP: Galvin Proffer, MD   Chief Complaint  Patient presents with   Shortness of Breath   Level of care: Med-Surg  Brief Admission History:   82 y.o. female with medical history significant of had arthritis for which patient manages walking with walker.  Patient does not typically use supplementary oxygen at home.  Patient was in her usual state of health till about 7 days ago when she reports new onset of cough with scant clear sputum production.  Patient did not have any fever chest pain or leg swelling.  However patient has been feeling short of breath for the last 48 hours but especially today.  Prompting her to be brought to [facility], ER by EMS.  Report obtained indicates that the patient was hypoxic in the 80s with EMS.  Patient was stabilized on 2 L/min of supplementary oxygen on which she has been since then.   Patient does not report any complaints at this time besides the bothersome cough.  Which is producing very scant sputum.   Assessment and Plan:  Community Acquired Pneumonia - continue IV ceftriaxone and azithromycin - continue supportive measures - continue steroids and bronchodilators - continue mucolytics and cough suppressants - wean to room air as able  - start to ambulate - encourage incentive spirometry   AKI  - prerenal - resolved after IV fluid hydration   Hyponatremia - chronic, moderate, asymptomatic. Cortisol, TSH, within normal limits.  Chronic Atrial fibrillation  - Rate controlled. - fully anticoagulated with apixaban  DVT prophylaxis: apixaban Code Status:  Full  Family Communication:  Disposition: return to ALF when medically stabilized   Consultants:   Procedures:   Antimicrobials:   Ceftriaxone/azithromycin 10/8>>  Subjective: Pt reports fatigue and malaise.  Pt reports cough and chest congestion.    Objective: Vitals:   04/02/23 0727 04/02/23  1300 04/02/23 1414 04/02/23 1443  BP:    (!) 150/91  Pulse:    83  Resp:    20  Temp:      TempSrc:      SpO2: 96% 97% 97% 95%  Weight:      Height:        Intake/Output Summary (Last 24 hours) at 04/02/2023 1527 Last data filed at 04/02/2023 1500 Gross per 24 hour  Intake 2274.67 ml  Output 1150 ml  Net 1124.67 ml   Filed Weights   04/01/23 1243 04/01/23 1637 04/02/23 0342  Weight: 110.1 kg 108 kg 106.5 kg   Examination:  General exam: Appears calm and comfortable  Respiratory system: diminished BS LLL Cardiovascular system: normal S1 & S2 heard. No JVD, murmurs, rubs, gallops or clicks. No pedal edema. Gastrointestinal system: Abdomen is nondistended, soft and nontender. No organomegaly or masses felt. Normal bowel sounds heard. Central nervous system: Alert and oriented. No focal neurological deficits. Extremities: Symmetric 5 x 5 power. Skin: No rashes, lesions or ulcers. Psychiatry: Judgement and insight appear normal. Mood & affect appropriate.   Data Reviewed: I have personally reviewed following labs and imaging studies  CBC: Recent Labs  Lab 04/01/23 1353 04/02/23 0451  WBC 13.4* 11.4*  NEUTROABS 8.5*  --   HGB 12.3 12.0  HCT 37.7 36.4  MCV 87.5 86.3  PLT 204 219    Basic Metabolic Panel: Recent Labs  Lab 04/01/23 1353 04/02/23 0451  NA 128* 127*  K 4.0 4.3  CL 90* 89*  CO2 28 29  GLUCOSE  123* 149*  BUN 12 12  CREATININE 1.08* 0.95  CALCIUM 8.3* 8.3*    CBG: No results for input(s): "GLUCAP" in the last 168 hours.  Recent Results (from the past 240 hour(s))  SARS Coronavirus 2 by RT PCR (hospital order, performed in [facility] hospital lab) *cepheid single result test* Anterior Nasal Swab     Status: None   Collection Time: 04/01/23 12:30 PM   Specimen: Anterior Nasal Swab  Result Value Ref Range Status   SARS Coronavirus 2 by RT PCR NEGATIVE NEGATIVE Final    Comment: (NOTE) SARS-CoV-2 target nucleic acids are NOT DETECTED.  The  SARS-CoV-2 RNA is generally detectable in upper and lower respiratory specimens during the acute phase of infection. The lowest concentration of SARS-CoV-2 viral copies this assay can detect is 250 copies / mL. A negative result does not preclude SARS-CoV-2 infection and should not be used as the sole basis for treatment or other patient management decisions.  A negative result may occur with improper specimen collection / handling, submission of specimen other than nasopharyngeal swab, presence of viral mutation(s) within the areas targeted by this assay, and inadequate number of viral copies (<250 copies / mL). A negative result must be combined with clinical observations, patient history, and epidemiological information.  Fact Sheet for Patients:   RoadLapTop.co.za  Fact Sheet for Healthcare Providers: http://kim-miller.com/  This test is not yet approved or  cleared by the [place] FDA and has been authorized for detection and/or diagnosis of SARS-CoV-2 by FDA under an Emergency Use Authorization (EUA).  This EUA will remain in effect (meaning this test can be used) for the duration of the COVID-19 declaration under Section 564(b)(1) of the Act, 21 U.S.C. section 360bbb-3(b)(1), unless the authorization is terminated or revoked sooner.  Performed at [facility], [address], [place], [place] [address]      Radiology Studies: [facility] Chest [facility] 1 View  Result Date: 04/01/2023 CLINICAL DATA:  Shortness of breath EXAM: PORTABLE CHEST 1 VIEW COMPARISON:  06/22/2021 FINDINGS: Film is rotated to the left. Slightly enlarged cardiopericardial silhouette with calcified and tortuous aorta. Lung base parenchymal opacities are seen, left-greater-than-right. Acute infiltrates possible. Recommend follow-up. No pneumothorax or edema. Overlapping cardiac leads. Right shoulder reverse arthroplasty. Chronic interstitial lung changes. Curvature and  degenerative changes of the spine. IMPRESSION: Rotated radiograph with enlarged heart. Lung base parenchymal opacities are seen, left greater than right. Infiltrate is possible. Electronically Signed   By: Karen Kays M.D.   On: 04/01/2023 14:45    Scheduled Meds:  acetylcysteine  4 mL Nebulization TID   albuterol  2.5 mg Nebulization TID   allopurinol  100 mg Oral Daily   apixaban  5 mg Oral Q12H   cholecalciferol  1,000 Units Oral Daily   divalproex  250 mg Oral Daily   DULoxetine  30 mg Oral Daily   fenofibrate  54 mg Oral Daily   linaclotide  72 mcg Oral QAC breakfast   metoprolol tartrate  12.5 mg Oral TID   midodrine  2.5 mg Oral TID WC   pantoprazole  40 mg Oral BID   QUEtiapine  50 mg Oral BID   rosuvastatin  20 mg Oral Daily   sodium chloride flush  3 mL Intravenous Q12H   Continuous Infusions:  azithromycin 500 mg (04/02/23 1444)   cefTRIAXone (ROCEPHIN)  IV 2 g (04/02/23 1341)     LOS: 1 day   Time spent: 51 mins  Virginia Myhre Laural Benes, MD How to  contact the [facility] Attending or Consulting provider 7A - 7P or covering provider during after hours 7P -7A, for this patient?  Check the care team in [facility] and look for a) attending/consulting [facility] provider listed and b) the [facility] team listed Log into [email or web address] to find provider on call.  Locate the [facility] provider you are looking for under [facility] and page to a number that you can be directly reached. If you still have difficulty reaching the provider, please page the [facility] (Director on Call) for the Hospitalists listed on amion for assistance.  04/02/2023, 3:27 PM

## 2023-04-02 NOTE — TOC Initial Note (Signed)
Transition of Care [facility]) - Initial/Assessment Note    Patient Details  Name: Virginia Young MRN: [id number] Date of Birth: [birth year]  Transition of Care [facility]) CM/SW Contact:    Karn Cassis, LCSW Phone Number: 04/02/2023, 9:15 AM  Clinical Narrative:  Pt admitted due to pneumonia. Pt reports she has been a resident at [facility] [place] for 2-3 years. She plans to return at d/c. Per Olegario Messier at facility, pt requires limited assist with ADLs and ambulates with a walker. No home health prior to admission. Okay to return and no assessment needed. TOC will follow.                  Expected Discharge Plan: Assisted Living Barriers to Discharge: Continued Medical Work up   Patient Goals and CMS Choice Patient states their goals for this hospitalization and ongoing recovery are:: return to ALF   Choice offered to / list presented to : Patient  ownership interest in [facility].provided to::  (n/a)    Expected Discharge Plan and Services In-house Referral: Clinical Social Work     Living arrangements for the past 2 months: Assisted Living Facility                                      Prior Living Arrangements/Services Living arrangements for the past 2 months: Assisted Living Facility Lives with:: Facility Resident Patient language and need for interpreter reviewed:: Yes Do you feel safe going back to the place where you live?: Yes      Need for Family Participation in Patient Care: No (Comment)   Current home services: DME (walker) Criminal Activity/Legal Involvement Pertinent to Current Situation/Hospitalization: No - Comment as needed  Activities of Daily Living   ADL Screening (condition at time of admission) Independently performs ADLs?: Yes (appropriate for developmental age) Is the patient deaf or have difficulty hearing?: No Does the patient have difficulty seeing, even when wearing glasses/contacts?: No Does the patient  have difficulty concentrating, remembering, or making decisions?: No  Permission Sought/Granted   Permission granted to share information with : Yes, Verbal Permission Granted     Permission granted to share info w AGENCY: [employer]  Permission granted to share info w Relationship: ALF     Emotional Assessment     Affect (typically observed): Appropriate Orientation: : Oriented to Self, Oriented to Place, Oriented to  Time, Oriented to Situation Alcohol / Substance Use: Not Applicable Psych Involvement: No (comment)  Admission diagnosis:  Pneumonia [J18.9] Community acquired pneumonia of right lower lobe of lung [J18.9] Acute hypoxic respiratory failure (HCC) [J96.01] Patient Active Problem List   Diagnosis Date Noted   Pneumonia 04/01/2023   Hyponatremia 04/01/2023   Elevated serum creatinine 04/01/2023   Syncope 09/16/2020   Hypokalemia 09/16/2020   Hypoalbuminemia 09/16/2020   Abrasion of right arm 09/16/2020   Right sided weakness 09/16/2020   Accidental fall 09/16/2020   Syncope and collapse 09/16/2020   Acute CVA (cerebrovascular accident) (HCC) 09/15/2020   Pyometra due to chronic inflammatory disease of uterus 02/18/2020   Postmenopausal 08/11/2019   Vitamin D deficiency 02/17/2019   Constipation 02/17/2019   Witnessed seizure-like activity (HCC) 02/03/2019   Chronic systolic HF (heart failure) (HCC) 10/08/2018   Educated about COVID-19 virus infection 10/08/2018   Dementia (HCC) 02/10/2018   S/P shoulder replacement, right 08/22/2017   Essential hypertension 02/19/2016   Voiding dysfunction  10/07/2014   SUI (stress urinary incontinence, female) 10/07/2014   Pathological fracture due to osteoporosis 04/06/2014   DOE (dyspnea on exertion) 07/23/2013   Helicobacter positive gastritis 06/21/2013   Orthostatic hypotension 06/10/2013   Recurrent UTI 05/06/2013   Neck mass 02/19/2013   Right groin mass 11/08/2011   Low back pain 11/08/2011   Lipoma of neck  06/21/2011   OVERACTIVE BLADDER 08/21/2010   Long term current use of anticoagulant 07/25/2010   Osteoarthritis 01/18/2010   KNEE PAIN 01/18/2010   FOOT PAIN 01/18/2010   Mixed hyperlipidemia 04/20/2009   GERD 04/17/2009   MITRAL REGURGITATION, MILD 08/30/2008   NICM (nonischemic cardiomyopathy) (HCC) 08/30/2008   Depression, major, single episode, moderate (HCC) 04/10/2007   DISEASE, MITRAL VALVE NEC/NOS 04/10/2007   Atrial fibrillation (HCC) 04/10/2007   STASIS ULCER 04/10/2007   VENOUS INSUFFICIENCY, LEGS 04/10/2007   PCP:  Galvin Proffer, MD Pharmacy:   [phone] - [place] [place] - [address] [place] [address] Phone: [phone] Fax: [phone]  [facility] [place] [place] [address] [place] [address] Phone: [phone] Fax: [phone]     Social Determinants of Health (SDOH) Social History: SDOH Screenings   Food Insecurity: No Food Insecurity (04/02/2023)  Housing: Low Risk  (04/02/2023)  Transportation Needs: No Transportation Needs (04/02/2023)  Utilities: Not At Risk (04/02/2023)  Alcohol Screen: Low Risk  (03/13/2022)  Depression (PHQ2-9): Low Risk  (03/13/2022)  Financial Resource Strain: Low Risk  (03/13/2022)  Physical Activity: Inactive (03/13/2022)  Social Connections: Moderately Isolated (03/13/2022)  Stress: No Stress Concern Present (03/13/2022)  Tobacco Use: Low Risk  (03/13/2022)   SDOH Interventions:     Readmission Risk Interventions     No data to display

## 2023-04-02 NOTE — Plan of Care (Signed)
  Problem: Education: Goal: Knowledge of General Education information will improve Description Including pain rating scale, medication(s)/side effects and non-pharmacologic comfort measures Outcome: Progressing   

## 2023-04-02 NOTE — Hospital Course (Signed)
82 y.o. female with medical history significant of had arthritis for which patient manages walking with walker.  Patient does not typically use supplementary oxygen at home.  Patient was in her usual state of health till about 7 days ago when she reports new onset of cough with scant clear sputum production.  Patient did not have any fever chest pain or leg swelling.  However patient has been feeling short of breath for the last 48 hours but especially today.  Prompting her to be brought to [facility], ER by EMS.  Report obtained indicates that the patient was hypoxic in the 80s with EMS.  Patient was stabilized on 2 L/min of supplementary oxygen on which she has been since then.   Patient does not report any complaints at this time besides the bothersome cough.  Which is producing very scant sputum.

## 2023-04-02 NOTE — Progress Notes (Signed)
Mobility Specialist Progress Note:    04/02/23 1120  Mobility  Activity Stood at bedside (Stood at edge of chair)  Level of Restaurant manager, fast food guard assist, Advertising account planner wheel walker  Range of Motion/Exercises Active;All extremities  Activity Response Tolerated well  Mobility Referral Yes  $Mobility charge 1 Mobility  Mobility Specialist Start Time (ACUTE ONLY) 1120  Mobility Specialist Stop Time (ACUTE ONLY) 1130  Mobility Specialist Time Calculation (min) (ACUTE ONLY) 10 min   Pt received in chair, agreeable to mobility. Required CGA to stand with RW. Tolerated well, deferred ambulation d/t necessary peri care. Returned pt to chair, notified NT. All needs met.   Lawerance Bach Mobility Specialist Please contact via Special educational needs teacher or  Rehab office at [phone]

## 2023-04-03 ENCOUNTER — Encounter (HOSPITAL_COMMUNITY): Payer: Self-pay | Admitting: Internal Medicine

## 2023-04-03 DIAGNOSIS — E871 Hypo-osmolality and hyponatremia: Secondary | ICD-10-CM | POA: Diagnosis not present

## 2023-04-03 DIAGNOSIS — J189 Pneumonia, unspecified organism: Secondary | ICD-10-CM | POA: Diagnosis not present

## 2023-04-03 DIAGNOSIS — I4821 Permanent atrial fibrillation: Secondary | ICD-10-CM | POA: Diagnosis not present

## 2023-04-03 LAB — CBC WITH DIFFERENTIAL/PLATELET
Abs Immature Granulocytes: 0.1 10*3/uL — ABNORMAL HIGH (ref 0.00–0.07)
Basophils Absolute: 0.1 10*3/uL (ref 0.0–0.1)
Basophils Relative: 0 %
Eosinophils Absolute: 0 10*3/uL (ref 0.0–0.5)
Eosinophils Relative: 0 %
HCT: 37.1 % (ref 36.0–46.0)
Hemoglobin: 12.2 g/dL (ref 12.0–15.0)
Immature Granulocytes: 1 %
Lymphocytes Relative: 15 %
Lymphs Abs: 2.5 10*3/uL (ref 0.7–4.0)
MCH: 28.4 pg (ref 26.0–34.0)
MCHC: 32.9 g/dL (ref 30.0–36.0)
MCV: 86.5 fL (ref 80.0–100.0)
Monocytes Absolute: 1.5 10*3/uL — ABNORMAL HIGH (ref 0.1–1.0)
Monocytes Relative: 10 %
Neutro Abs: 12 10*3/uL — ABNORMAL HIGH (ref 1.7–7.7)
Neutrophils Relative %: 74 %
Platelets: 222 10*3/uL (ref 150–400)
RBC: 4.29 MIL/uL (ref 3.87–5.11)
RDW: 14 % (ref 11.5–15.5)
WBC: 16.1 10*3/uL — ABNORMAL HIGH (ref 4.0–10.5)
nRBC: 0 % (ref 0.0–0.2)

## 2023-04-03 LAB — BASIC METABOLIC PANEL
Anion gap: 9 (ref 5–15)
BUN: 16 mg/dL (ref 8–23)
CO2: 29 mmol/L (ref 22–32)
Calcium: 8.4 mg/dL — ABNORMAL LOW (ref 8.9–10.3)
Chloride: 90 mmol/L — ABNORMAL LOW (ref 98–111)
Creatinine, Ser: 0.83 mg/dL (ref 0.44–1.00)
GFR, Estimated: >60 mL/min (ref >60)
Glucose, Bld: 120 mg/dL — ABNORMAL HIGH (ref 70–99)
Potassium: 4.3 mmol/L (ref 3.5–5.1)
Sodium: 128 mmol/L — ABNORMAL LOW (ref 135–145)

## 2023-04-03 MED ORDER — IPRATROPIUM-ALBUTEROL 0.5-2.5 (3) MG/3ML IN SOLN
3.0000 mL | Freq: Four times a day (QID) | RESPIRATORY_TRACT | Status: DC
  Administered 2023-04-03 (×2): 3 mL via RESPIRATORY_TRACT
  Filled 2023-04-03 (×2): qty 3

## 2023-04-03 MED ORDER — ALBUTEROL SULFATE (2.5 MG/3ML) 0.083% IN NEBU: 2.5000 mg | INHALATION_SOLUTION | Freq: Four times a day (QID) | RESPIRATORY_TRACT | Status: DC | PRN

## 2023-04-03 MED ORDER — DEXTROMETHORPHAN POLISTIREX ER 30 MG/5ML PO SUER: 30.0000 mg | Freq: Two times a day (BID) | ORAL | Status: DC | PRN

## 2023-04-03 MED ORDER — GUAIFENESIN ER 600 MG PO TB12
600.0000 mg | ORAL_TABLET | Freq: Two times a day (BID) | ORAL | Status: DC
  Administered 2023-04-03 – 2023-04-04 (×3): 600 mg via ORAL
  Filled 2023-04-03 (×3): qty 1

## 2023-04-03 MED ORDER — ONDANSETRON HCL 4 MG/2ML IJ SOLN
4.0000 mg | Freq: Four times a day (QID) | INTRAMUSCULAR | Status: DC | PRN
  Administered 2023-04-03 – 2023-04-04 (×2): 4 mg via INTRAVENOUS
  Filled 2023-04-03 (×2): qty 2

## 2023-04-03 MED ORDER — IPRATROPIUM-ALBUTEROL 0.5-2.5 (3) MG/3ML IN SOLN
3.0000 mL | Freq: Three times a day (TID) | RESPIRATORY_TRACT | Status: DC
  Administered 2023-04-04: 3 mL via RESPIRATORY_TRACT
  Filled 2023-04-03 (×2): qty 3

## 2023-04-03 NOTE — Plan of Care (Signed)

## 2023-04-03 NOTE — Progress Notes (Signed)
PROGRESS NOTE   Virginia Young  ZOX:[id number] DOB: 08-28-1940 DOA: 04/01/2023 PCP: Galvin Proffer, MD   Chief Complaint  Patient presents with   Shortness of Breath   Level of care: Med-Surg  Brief Admission History:   82 y.o. female with medical history significant of had arthritis for which patient manages walking with walker.  Patient does not typically use supplementary oxygen at home.  Patient was in her usual state of health till about 7 days ago when she reports new onset of cough with scant clear sputum production.  Patient did not have any fever chest pain or leg swelling.  However patient has been feeling short of breath for the last 48 hours but especially today.  Prompting her to be brought to [facility], ER by EMS.  Report obtained indicates that the patient was hypoxic in the 80s with EMS.  Patient was stabilized on 2 L/min of supplementary oxygen on which she has been since then.   Patient does not report any complaints at this time besides the bothersome cough.  Which is producing very scant sputum.   Assessment and Plan:  Community Acquired Pneumonia - continue IV ceftriaxone and azithromycin - continue supportive measures - continue steroids and bronchodilators - continue mucolytics and cough suppressants - wean to room air as able  - start to ambulate - encourage incentive spirometry   AKI  - prerenal - resolved after IV fluid hydration   Hyponatremia - chronic, moderate, asymptomatic. Cortisol, TSH, within normal limits.  Chronic Atrial fibrillation  - Rate controlled. - fully anticoagulated with apixaban  DVT prophylaxis: apixaban Code Status:  Full  Family Communication:  Disposition: return to ALF when medically stabilized   Consultants:   Procedures:   Antimicrobials:   Ceftriaxone/azithromycin 10/8>>  Subjective: Still having cough and chest congestion but less wheezing.     Objective: Vitals:   04/02/23 2130 04/03/23 0557 04/03/23  0758 04/03/23 1240  BP: (!) 147/59 (!) 150/88  (!) 145/94  Pulse: 89 79  82  Resp: 20 20  18   Temp: 98.3 F (36.8 C) 97.6 F (36.4 C)  98.7 F (37.1 C)  TempSrc:    Oral  SpO2: 97% 98% 98% 96%  Weight:  106.9 kg    Height:        Intake/Output Summary (Last 24 hours) at 04/03/2023 1253 Last data filed at 04/03/2023 0500 Gross per 24 hour  Intake 956.76 ml  Output 1600 ml  Net -643.24 ml   Filed Weights   04/01/23 1637 04/02/23 0342 04/03/23 0557  Weight: 108 kg 106.5 kg 106.9 kg   Examination:  General exam: Appears calm and comfortable  Respiratory system: wheezing heard RLL Cardiovascular system: normal S1 & S2 heard. No JVD, murmurs, rubs, gallops or clicks. No pedal edema. Gastrointestinal system: Abdomen is nondistended, soft and nontender. No organomegaly or masses felt. Normal bowel sounds heard. Central nervous system: Alert and oriented. No focal neurological deficits. Extremities: Symmetric 5 x 5 power. Skin: No rashes, lesions or ulcers. Psychiatry: Judgement and insight appear normal. Mood & affect appropriate.   Data Reviewed: I have personally reviewed following labs and imaging studies  CBC: Recent Labs  Lab 04/01/23 1353 04/02/23 0451 04/03/23 0828  WBC 13.4* 11.4* 16.1*  NEUTROABS 8.5*  --  12.0*  HGB 12.3 12.0 12.2  HCT 37.7 36.4 37.1  MCV 87.5 86.3 86.5  PLT 204 219 222    Basic Metabolic Panel: Recent Labs  Lab 04/01/23 1353 04/02/23 0451  04/03/23 0828  NA 128* 127* 128*  K 4.0 4.3 4.3  CL 90* 89* 90*  CO2 28 29 29   GLUCOSE 123* 149* 120*  BUN 12 12 16   CREATININE 1.08* 0.95 0.83  CALCIUM 8.3* 8.3* 8.4*    CBG: No results for input(s): "GLUCAP" in the last 168 hours.  Recent Results (from the past 240 hour(s))  SARS Coronavirus 2 by RT PCR (hospital order, performed in [facility] hospital lab) *cepheid single result test* Anterior Nasal Swab     Status: None   Collection Time: 04/01/23 12:30 PM   Specimen: Anterior Nasal  Swab  Result Value Ref Range Status   SARS Coronavirus 2 by RT PCR NEGATIVE NEGATIVE Final    Comment: (NOTE) SARS-CoV-2 target nucleic acids are NOT DETECTED.  The SARS-CoV-2 RNA is generally detectable in upper and lower respiratory specimens during the acute phase of infection. The lowest concentration of SARS-CoV-2 viral copies this assay can detect is 250 copies / mL. A negative result does not preclude SARS-CoV-2 infection and should not be used as the sole basis for treatment or other patient management decisions.  A negative result may occur with improper specimen collection / handling, submission of specimen other than nasopharyngeal swab, presence of viral mutation(s) within the areas targeted by this assay, and inadequate number of viral copies (<250 copies / mL). A negative result must be combined with clinical observations, patient history, and epidemiological information.  Fact Sheet for Patients:   RoadLapTop.co.za  Fact Sheet for Healthcare Providers: http://kim-miller.com/  This test is not yet approved or  cleared by the [place] FDA and has been authorized for detection and/or diagnosis of SARS-CoV-2 by FDA under an Emergency Use Authorization (EUA).  This EUA will remain in effect (meaning this test can be used) for the duration of the COVID-19 declaration under Section 564(b)(1) of the Act, 21 U.S.C. section 360bbb-3(b)(1), unless the authorization is terminated or revoked sooner.  Performed at [facility], [address] [place], [place] [address]      Radiology Studies: [facility] 1 View  Result Date: 04/01/2023 CLINICAL DATA:  Shortness of breath EXAM: PORTABLE CHEST 1 VIEW COMPARISON:  06/22/2021 FINDINGS: Film is rotated to the left. Slightly enlarged cardiopericardial silhouette with calcified and tortuous aorta. Lung base parenchymal opacities are seen, left-greater-than-right. Acute infiltrates  possible. Recommend follow-up. No pneumothorax or edema. Overlapping cardiac leads. Right shoulder reverse arthroplasty. Chronic interstitial lung changes. Curvature and degenerative changes of the spine. IMPRESSION: Rotated radiograph with enlarged heart. Lung base parenchymal opacities are seen, left greater than right. Infiltrate is possible. Electronically Signed   By: Karen Kays M.D.   On: 04/01/2023 14:45    Scheduled Meds:  acetylcysteine  4 mL Nebulization TID   allopurinol  100 mg Oral Daily   apixaban  5 mg Oral Q12H   cholecalciferol  1,000 Units Oral Daily   divalproex  250 mg Oral Daily   DULoxetine  30 mg Oral Daily   fenofibrate  54 mg Oral Daily   guaiFENesin  600 mg Oral BID   ipratropium-albuterol  3 mL Nebulization Q6H   linaclotide  72 mcg Oral QAC breakfast   metoprolol tartrate  12.5 mg Oral TID   midodrine  2.5 mg Oral TID WC   pantoprazole  40 mg Oral BID   predniSONE  40 mg Oral Q breakfast   QUEtiapine  50 mg Oral BID   rosuvastatin  20 mg Oral Daily   sodium chloride  flush  3 mL Intravenous Q12H   Continuous Infusions:  azithromycin Stopped (04/02/23 1620)   cefTRIAXone (ROCEPHIN)  IV Stopped (04/02/23 1416)     LOS: 2 days   Time spent: 44 mins  Brittnay Pigman Laural Benes, MD How to contact the [facility] Attending or Consulting provider 7A - 7P or covering provider during after hours 7P -7A, for this patient?  Check the care team in [facility] and look for a) attending/consulting TRH provider listed and b) the [facility] team listed Log into [email or web address] to find provider on call.  Locate the [facility] provider you are looking for under [facility] and page to a number that you can be directly reached. If you still have difficulty reaching the provider, please page the [facility] (Director on Call) for the Hospitalists listed on amion for assistance.  04/03/2023, 12:53 PM

## 2023-04-03 NOTE — Plan of Care (Signed)

## 2023-04-04 DIAGNOSIS — J189 Pneumonia, unspecified organism: Secondary | ICD-10-CM | POA: Diagnosis not present

## 2023-04-04 DIAGNOSIS — I4821 Permanent atrial fibrillation: Secondary | ICD-10-CM | POA: Diagnosis not present

## 2023-04-04 DIAGNOSIS — E871 Hypo-osmolality and hyponatremia: Secondary | ICD-10-CM | POA: Diagnosis not present

## 2023-04-04 MED ORDER — IPRATROPIUM-ALBUTEROL 0.5-2.5 (3) MG/3ML IN SOLN: 3.0000 mL | Freq: Three times a day (TID) | RESPIRATORY_TRACT | 0 refills | Status: DC

## 2023-04-04 MED ORDER — DEXTROMETHORPHAN POLISTIREX ER 30 MG/5ML PO SUER: 30.0000 mg | Freq: Two times a day (BID) | ORAL | 0 refills | Status: DC | PRN

## 2023-04-04 MED ORDER — POLYETHYLENE GLYCOL 3350 17 G PO PACK: 17.0000 g | PACK | Freq: Every day | ORAL | Status: DC | PRN

## 2023-04-04 MED ORDER — PREDNISONE 20 MG PO TABS: 40.0000 mg | ORAL_TABLET | Freq: Every day | ORAL | 0 refills | Status: AC

## 2023-04-04 MED ORDER — DOXYCYCLINE HYCLATE 100 MG PO CAPS: 100.0000 mg | ORAL_CAPSULE | Freq: Two times a day (BID) | ORAL | 0 refills | Status: AC

## 2023-04-04 MED ORDER — GUAIFENESIN ER 600 MG PO TB12: 600.0000 mg | ORAL_TABLET | Freq: Two times a day (BID) | ORAL | 0 refills | Status: AC

## 2023-04-04 MED ORDER — FOLIC ACID 1 MG PO TABS
1.0000 mg | ORAL_TABLET | Freq: Every day | ORAL | Status: DC
  Administered 2023-04-04: 1 mg via ORAL
  Filled 2023-04-04: qty 1

## 2023-04-04 NOTE — Care Management Important Message (Signed)
Important Message  Patient Details  Name: Virginia Young MRN: [id number] Date of Birth: 1941/05/24   Important Message Given:  Yes - Medicare IM     Corey Harold 04/04/2023, 12:05 PM

## 2023-04-04 NOTE — Discharge Summary (Addendum)
Physician Discharge Summary  Virginia Young:[id number] DOB: 10/19/1940 DOA: 04/01/2023  PCP: Galvin Proffer, MD  Admit date: 04/01/2023 Discharge date: 04/04/2023  Disposition: ALF  Recommendations for Outpatient Follow-up:  Follow up with PCP in 1 weeks  Home Health:  PT  Discharge Condition: STABLE   CODE STATUS: FULL DIET: resume previous home diet    Brief Hospitalization Summary: Please see all hospital notes, images, labs for full details of the hospitalization. Admission provider HPI:   82 y.o. female with medical history significant of had arthritis for which patient manages walking with walker.  Patient does not typically use supplementary oxygen at home.  Patient was in her usual state of health till about 7 days ago when she reports new onset of cough with scant clear sputum production.  Patient did not have any fever chest pain or leg swelling.  However patient has been feeling short of breath for the last 48 hours but especially today.  Prompting her to be brought to [facility], ER by EMS.  Report obtained indicates that the patient was hypoxic in the 80s with EMS.  Patient was stabilized on 2 L/min of supplementary oxygen on which she has been since then.   Patient does not report any complaints at this time besides the bothersome cough.  Which is producing very scant sputum.  Hospital Course by problem   Community Acquired Pneumonia - pt was treated with IV ceftriaxone and azithromycin - continue supportive measures - continue steroids and bronchodilators - continue mucolytics and cough suppressants - wean to room air as able in the outpatient setting - start to ambulate with home health PT  - encouraged incentive spirometry     Hyponatremia - chronic, moderate, asymptomatic. Cortisol, TSH, within normal limits.   Chronic Atrial fibrillation  - Rate controlled. - fully anticoagulated with apixaban  Acute on chronic respiratory failure with hypoxia  - pt  will discharge on home oxygen and possible NIV if insurance approves Ordering AVAPS-AE for Acute on chronic respiratory failure  along w/ COPD. The addition of the auto-titrating feature is required for this patient to decrease the PaCO2 more efficiently and rapidly due to the severity of his condition. Volume Ventilation is indicated. Ordering NIV with AVAPS-AE for use during hours of sleep as well as during waking hours when symptomatic. Ordering NIV with AVAPS-AE as opposed to BiPAP S/T or BiPAP-AVAPS as patient requires advanced pressure ranges to achieve consistent and reliable volumes. As COPD is not a nocturnal disorder, but rather one that affects ventilation at all hours of the day, this patient would benefit from a ventilatory device with portability and the option to use for exertional distress during the day. BiPAP and RAD devices cannot provide these necessary features.   Discharge Diagnoses:  Principal Problem:   Pneumonia Active Problems:   Atrial fibrillation (HCC)   Hyponatremia   Elevated serum creatinine   Discharge Instructions:  Allergies as of 04/04/2023       Reactions   Bactrim [sulfamethoxazole-trimethoprim]    Mouth sores   Hydrocortisone Other (See Comments)   Unknown, no reaction listed on MAR   Lipitor [atorvastatin Calcium]    myalgia   Lisinopril    cough        Medication List     TAKE these medications    allopurinol 100 MG tablet Commonly known as: ZYLOPRIM Take 100 mg by mouth daily.   dextromethorphan 30 MG/5ML liquid Commonly known as: DELSYM Take 5 mLs (30 mg  total) by mouth 2 (two) times daily as needed for cough.   divalproex 250 MG 24 hr tablet Commonly known as: DEPAKOTE ER Take 1 tablet (250 mg total) by mouth daily.   docusate sodium 100 MG capsule Commonly known as: COLACE Take 100 mg by mouth daily.   doxycycline 100 MG capsule Commonly known as: VIBRAMYCIN Take 1 capsule (100 mg total) by mouth 2 (two) times daily for  5 days.   DULoxetine 30 MG capsule Commonly known as: CYMBALTA Take 1 capsule (30 mg total) by mouth daily.   Eliquis 5 MG Tabs tablet Generic drug: apixaban TAKE 1 TABLET BY MOUTH EVERY 12 HOURS   fenofibrate 48 MG tablet Commonly known as: TRICOR Take 48 mg by mouth daily.   folic acid 1 MG tablet Commonly known as: FOLVITE Take 1 mg by mouth daily.   guaiFENesin 600 MG 12 hr tablet Commonly known as: MUCINEX Take 1 tablet (600 mg total) by mouth 2 (two) times daily for 5 days.   ipratropium-albuterol 0.5-2.5 (3) MG/3ML Soln Commonly known as: DUONEB Take 3 mLs by nebulization 3 (three) times daily.   OXYGEN Inhale into the lungs. 2 liters PRN - anytime when needed ( cardio rx'd)   polyethylene glycol 17 g packet Commonly known as: MIRALAX / GLYCOLAX Take 17 g by mouth daily as needed for mild constipation.   predniSONE 20 MG tablet Commonly known as: DELTASONE Take 2 tablets (40 mg total) by mouth daily with breakfast for 5 days. Start taking on: April 05, 2023               Durable Medical Equipment  (From admission, onward)           Start     Ordered   04/04/23 1131  For home use only DME oxygen  Once       Question Answer Comment  Length of Need Lifetime   Mode or (Route) Nasal cannula   Liters per Minute 2   Frequency Continuous (stationary and portable oxygen unit needed)   Oxygen conserving device Yes   Oxygen delivery system Gas      04/04/23 1130   04/04/23 1130  For home use only DME Nebulizer machine  Once       Question Answer Comment  Patient needs a nebulizer to treat with the following condition COPD (chronic obstructive pulmonary disease) (HCC)   Length of Need Lifetime      04/04/23 1129            Follow-up Information     Hague, Myrene Galas, MD. Schedule an appointment as soon as possible for a visit in 1 week(s).   Specialty: Internal Medicine Why: Hospital Follow Up Contact information: [address] [place] [address] [phone]                Allergies  Allergen Reactions   Bactrim [Sulfamethoxazole-Trimethoprim]     Mouth sores   Hydrocortisone Other (See Comments)    Unknown, no reaction listed on MAR   Lipitor [Atorvastatin Calcium]     myalgia   Lisinopril     cough   Allergies as of 04/04/2023       Reactions   Bactrim [sulfamethoxazole-trimethoprim]    Mouth sores   Hydrocortisone Other (See Comments)   Unknown, no reaction listed on MAR   Lipitor [atorvastatin Calcium]    myalgia   Lisinopril    cough        Medication List  TAKE these medications    allopurinol 100 MG tablet Commonly known as: ZYLOPRIM Take 100 mg by mouth daily.   dextromethorphan 30 MG/5ML liquid Commonly known as: DELSYM Take 5 mLs (30 mg total) by mouth 2 (two) times daily as needed for cough.   divalproex 250 MG 24 hr tablet Commonly known as: DEPAKOTE ER Take 1 tablet (250 mg total) by mouth daily.   docusate sodium 100 MG capsule Commonly known as: COLACE Take 100 mg by mouth daily.   doxycycline 100 MG capsule Commonly known as: VIBRAMYCIN Take 1 capsule (100 mg total) by mouth 2 (two) times daily for 5 days.   DULoxetine 30 MG capsule Commonly known as: CYMBALTA Take 1 capsule (30 mg total) by mouth daily.   Eliquis 5 MG Tabs tablet Generic drug: apixaban TAKE 1 TABLET BY MOUTH EVERY 12 HOURS   fenofibrate 48 MG tablet Commonly known as: TRICOR Take 48 mg by mouth daily.   folic acid 1 MG tablet Commonly known as: FOLVITE Take 1 mg by mouth daily.   guaiFENesin 600 MG 12 hr tablet Commonly known as: MUCINEX Take 1 tablet (600 mg total) by mouth 2 (two) times daily for 5 days.   ipratropium-albuterol 0.5-2.5 (3) MG/3ML Soln Commonly known as: DUONEB Take 3 mLs by nebulization 3 (three) times daily.   OXYGEN Inhale into the lungs. 2 liters PRN - anytime when needed ( cardio rx'd)   polyethylene glycol 17 g packet Commonly known  as: MIRALAX / GLYCOLAX Take 17 g by mouth daily as needed for mild constipation.   predniSONE 20 MG tablet Commonly known as: DELTASONE Take 2 tablets (40 mg total) by mouth daily with breakfast for 5 days. Start taking on: April 05, 2023               Durable Medical Equipment  (From admission, onward)           Start     Ordered   04/04/23 1131  For home use only DME oxygen  Once       Question Answer Comment  Length of Need Lifetime   Mode or (Route) Nasal cannula   Liters per Minute 2   Frequency Continuous (stationary and portable oxygen unit needed)   Oxygen conserving device Yes   Oxygen delivery system Gas      04/04/23 1130   04/04/23 1130  For home use only DME Nebulizer machine  Once       Question Answer Comment  Patient needs a nebulizer to treat with the following condition COPD (chronic obstructive pulmonary disease) (HCC)   Length of Need Lifetime      04/04/23 1129            Procedures/Studies: DG Chest Port 1 View  Result Date: 04/01/2023 CLINICAL DATA:  Shortness of breath EXAM: PORTABLE CHEST 1 VIEW COMPARISON:  06/22/2021 FINDINGS: Film is rotated to the left. Slightly enlarged cardiopericardial silhouette with calcified and tortuous aorta. Lung base parenchymal opacities are seen, left-greater-than-right. Acute infiltrates possible. Recommend follow-up. No pneumothorax or edema. Overlapping cardiac leads. Right shoulder reverse arthroplasty. Chronic interstitial lung changes. Curvature and degenerative changes of the spine. IMPRESSION: Rotated radiograph with enlarged heart. Lung base parenchymal opacities are seen, left greater than right. Infiltrate is possible. Electronically Signed   By: Karen Kays M.D.   On: 04/01/2023 14:45     Subjective: No specific complaints, worked well with PT this morning.    Discharge Exam: Vitals:   04/04/23 0350  04/04/23 1050  BP: (!) 143/80   Pulse: 98   Resp: 20   Temp: 98.4 F (36.9 C)    SpO2: 92% 90%   Vitals:   04/03/23 2110 04/03/23 2138 04/04/23 0350 04/04/23 1050  BP: (!) 153/85 (!) 146/75 (!) 143/80   Pulse: 87 89 98   Resp: 20 20 20    Temp: 98 F (36.7 C) 98.9 F (37.2 C) 98.4 F (36.9 C)   TempSrc: Oral  Oral   SpO2: 92% 92% 92% 90%  Weight:   106.8 kg   Height:        General: Pt is alert, awake, not in acute distress Cardiovascular: RRR, S1/S2 +, no rubs, no gallops Respiratory: rare bibasilar expiratory wheezing, no rhonchi Abdominal: Soft, NT, ND, bowel sounds + Extremities: no edema, no cyanosis   The results of significant diagnostics from this hospitalization (including imaging, microbiology, ancillary and laboratory) are listed below for reference.     Microbiology: Recent Results (from the past 240 hour(s))  SARS Coronavirus 2 by RT PCR (hospital order, performed in [facility] hospital lab) *cepheid single result test* Anterior Nasal Swab     Status: None   Collection Time: 04/01/23 12:30 PM   Specimen: Anterior Nasal Swab  Result Value Ref Range Status   SARS Coronavirus 2 by RT PCR NEGATIVE NEGATIVE Final    Comment: (NOTE) SARS-CoV-2 target nucleic acids are NOT DETECTED.  The SARS-CoV-2 RNA is generally detectable in upper and lower respiratory specimens during the acute phase of infection. The lowest concentration of SARS-CoV-2 viral copies this assay can detect is 250 copies / mL. A negative result does not preclude SARS-CoV-2 infection and should not be used as the sole basis for treatment or other patient management decisions.  A negative result may occur with improper specimen collection / handling, submission of specimen other than nasopharyngeal swab, presence of viral mutation(s) within the areas targeted by this assay, and inadequate number of viral copies (<250 copies / mL). A negative result must be combined with clinical observations, patient history, and epidemiological information.  Fact Sheet for Patients:    RoadLapTop.co.za  Fact Sheet for Healthcare Providers: http://kim-miller.com/  This test is not yet approved or  cleared by the [place] FDA and has been authorized for detection and/or diagnosis of SARS-CoV-2 by FDA under an Emergency Use Authorization (EUA).  This EUA will remain in effect (meaning this test can be used) for the duration of the COVID-19 declaration under Section 564(b)(1) of the Act, 21 U.S.C. section 360bbb-3(b)(1), unless the authorization is terminated or revoked sooner.  Performed at [facility], [address], [place], [place] [address]      Labs: BNP (last 3 results) Recent Labs    04/01/23 1353  BNP 204.0*   Basic Metabolic Panel: Recent Labs  Lab 04/01/23 1353 04/02/23 0451 04/03/23 0828  NA 128* 127* 128*  K 4.0 4.3 4.3  CL 90* 89* 90*  CO2 28 29 29   GLUCOSE 123* 149* 120*  BUN 12 12 16   CREATININE 1.08* 0.95 0.83  CALCIUM 8.3* 8.3* 8.4*   Liver Function Tests: Recent Labs  Lab 04/01/23 1353  AST 20  ALT 13  ALKPHOS 63  BILITOT 0.6  PROT 6.6  ALBUMIN 3.0*   No results for input(s): "LIPASE", "AMYLASE" in the last 168 hours. No results for input(s): "AMMONIA" in the last 168 hours. CBC: Recent Labs  Lab 04/01/23 1353 04/02/23 0451 04/03/23 0828  WBC 13.4* 11.4* 16.1*  NEUTROABS  8.5*  --  12.0*  HGB 12.3 12.0 12.2  HCT 37.7 36.4 37.1  MCV 87.5 86.3 86.5  PLT 204 219 222   Cardiac Enzymes: No results for input(s): "CKTOTAL", "CKMB", "CKMBINDEX", "TROPONINI" in the last 168 hours. BNP: Invalid input(s): "POCBNP" CBG: No results for input(s): "GLUCAP" in the last 168 hours. D-Dimer No results for input(s): "DDIMER" in the last 72 hours. Hgb A1c No results for input(s): "HGBA1C" in the last 72 hours. Lipid Profile No results for input(s): "CHOL", "HDL", "LDLCALC", "TRIG", "CHOLHDL", "LDLDIRECT" in the last 72 hours. Thyroid function studies Recent Labs     04/02/23 0451  TSH 0.437   Anemia work up No results for input(s): "VITAMINB12", "FOLATE", "FERRITIN", "TIBC", "IRON", "RETICCTPCT" in the last 72 hours. Urinalysis    Component Value Date/Time   COLORURINE YELLOW 04/01/2023 1858   APPEARANCEUR CLEAR 04/01/2023 1858   APPEARANCEUR Cloudy (A) 09/12/2020 1321   LABSPEC 1.011 04/01/2023 1858   PHURINE 6.0 04/01/2023 1858   GLUCOSEU NEGATIVE 04/01/2023 1858   GLUCOSEU NEGATIVE 07/23/2013 1401   HGBUR NEGATIVE 04/01/2023 1858   BILIRUBINUR NEGATIVE 04/01/2023 1858   BILIRUBINUR Negative 09/12/2020 1321   KETONESUR NEGATIVE 04/01/2023 1858   PROTEINUR NEGATIVE 04/01/2023 1858   UROBILINOGEN 0.2 07/23/2013 1401   NITRITE NEGATIVE 04/01/2023 1858   LEUKOCYTESUR LARGE (A) 04/01/2023 1858   Sepsis Labs Recent Labs  Lab 04/01/23 1353 04/02/23 0451 04/03/23 0828  WBC 13.4* 11.4* 16.1*   Microbiology Recent Results (from the past 240 hour(s))  SARS Coronavirus 2 by RT PCR (hospital order, performed in [facility] hospital lab) *cepheid single result test* Anterior Nasal Swab     Status: None   Collection Time: 04/01/23 12:30 PM   Specimen: Anterior Nasal Swab  Result Value Ref Range Status   SARS Coronavirus 2 by RT PCR NEGATIVE NEGATIVE Final    Comment: (NOTE) SARS-CoV-2 target nucleic acids are NOT DETECTED.  The SARS-CoV-2 RNA is generally detectable in upper and lower respiratory specimens during the acute phase of infection. The lowest concentration of SARS-CoV-2 viral copies this assay can detect is 250 copies / mL. A negative result does not preclude SARS-CoV-2 infection and should not be used as the sole basis for treatment or other patient management decisions.  A negative result may occur with improper specimen collection / handling, submission of specimen other than nasopharyngeal swab, presence of viral mutation(s) within the areas targeted by this assay, and inadequate number of viral copies (<250 copies / mL). A  negative result must be combined with clinical observations, patient history, and epidemiological information.  Fact Sheet for Patients:   RoadLapTop.co.za  Fact Sheet for Healthcare Providers: http://kim-miller.com/  This test is not yet approved or  cleared by the [place] FDA and has been authorized for detection and/or diagnosis of SARS-CoV-2 by FDA under an Emergency Use Authorization (EUA).  This EUA will remain in effect (meaning this test can be used) for the duration of the COVID-19 declaration under Section 564(b)(1) of the Act, 21 U.S.C. section 360bbb-3(b)(1), unless the authorization is terminated or revoked sooner.  Performed at [facility], [address] [place], [place] [address]    Time coordinating discharge:  46 mins  SIGNED:  Standley Dakins, MD  [facility] 04/04/2023, 11:40 AM How to contact the [facility] Attending or Consulting provider 7A - 7P or covering provider during after hours 7P -7A, for this patient?  Check the care team in [facility] and look for a) attending/consulting [facility] provider listed  and b) the [facility] team listed Log into [email or web address] and use [place]'s universal password to access. If you do not have the password, please contact the hospital operator. Locate the [facility] provider you are looking for under [facility] and page to a number that you can be directly reached. If you still have difficulty reaching the provider, please page the [facility] (Director on Call) for the Hospitalists listed on amion for assistance.

## 2023-04-04 NOTE — Discharge Instructions (Signed)
IMPORTANT INFORMATION: PAY CLOSE ATTENTION   PHYSICIAN DISCHARGE INSTRUCTIONS  Follow with Primary care provider  Hague, Myrene Galas, MD  and other consultants as instructed by your Hospitalist Physician  SEEK MEDICAL CARE OR RETURN TO EMERGENCY ROOM IF SYMPTOMS COME BACK, WORSEN OR NEW PROBLEM DEVELOPS   Please note: You were cared for by a hospitalist during your hospital stay. Every effort will be made to forward records to your primary care provider.  You can request that your primary care provider send for your hospital records if they have not received them.  Once you are discharged, your primary care physician will handle any further medical issues. Please note that NO REFILLS for any discharge medications will be authorized once you are discharged, as it is imperative that you return to your primary care physician (or establish a relationship with a primary care physician if you do not have one) for your post hospital discharge needs so that they can reassess your need for medications and monitor your lab values.  Please get a complete blood count and chemistry panel checked by your Primary MD at your next visit, and again as instructed by your Primary MD.  Get Medicines reviewed and adjusted: Please take all your medications with you for your next visit with your Primary MD  Laboratory/radiological data: Please request your Primary MD to go over all hospital tests and procedure/radiological results at the follow up, please ask your primary care provider to get all Hospital records sent to his/her office.  In some cases, they will be blood work, cultures and biopsy results pending at the time of your discharge. Please request that your primary care provider follow up on these results.  If you are diabetic, please bring your blood sugar readings with you to your follow up appointment with primary care.    Please call and make your follow up appointments as soon as possible.    Also Note  the following: If you experience worsening of your admission symptoms, develop shortness of breath, life threatening emergency, suicidal or homicidal thoughts you must seek medical attention immediately by calling 911 or calling your MD immediately  if symptoms less severe.  You must read complete instructions/literature along with all the possible adverse reactions/side effects for all the Medicines you take and that have been prescribed to you. Take any new Medicines after you have completely understood and accpet all the possible adverse reactions/side effects.   Do not drive when taking Pain medications or sleeping medications (Benzodiazepines)  Do not take more than prescribed Pain, Sleep and Anxiety Medications. It is not advisable to combine anxiety,sleep and pain medications without talking with your primary care practitioner  Special Instructions: If you have smoked or chewed Tobacco  in the last 2 yrs please stop smoking, stop any regular Alcohol  and or any Recreational drug use.  Wear Seat belts while driving.  Do not drive if taking any narcotic, mind altering or controlled substances or recreational drugs or alcohol.

## 2023-04-04 NOTE — Plan of Care (Signed)
  Problem: Education: Goal: Knowledge of General Education information will improve Description Including pain rating scale, medication(s)/side effects and non-pharmacologic comfort measures Outcome: Progressing   Problem: Health Behavior/Discharge Planning: Goal: Ability to manage health-related needs will improve Outcome: Progressing   

## 2023-04-04 NOTE — [place] FL2 (Signed)
[place] MEDICAID FL2 LEVEL OF CARE FORM     IDENTIFICATION  Patient Name: Virginia Young Birthdate: [birth year] Sex: female Admission Date (Current Location): 04/01/2023  [facility] and [place] Number:  [employer] and Address:  [facility],  [address], [place] [address]      Provider Number: [id number]  Attending Physician Name and Address:  Cleora Fleet, MD  Relative Name and Phone Number:       Current Level of Care: Hospital Recommended Level of Care: Assisted Living Facility Prior Approval Number:    Date Approved/Denied:   PASRR Number:    Discharge Plan: Other (Comment) (ALF)    Current Diagnoses: Patient Active Problem List   Diagnosis Date Noted   Pneumonia 04/01/2023   Hyponatremia 04/01/2023   Elevated serum creatinine 04/01/2023   Syncope 09/16/2020   Hypokalemia 09/16/2020   Hypoalbuminemia 09/16/2020   Abrasion of right arm 09/16/2020   Right sided weakness 09/16/2020   Accidental fall 09/16/2020   Syncope and collapse 09/16/2020   Acute CVA (cerebrovascular accident) (HCC) 09/15/2020   Pyometra due to chronic inflammatory disease of uterus 02/18/2020   Postmenopausal 08/11/2019   Vitamin D deficiency 02/17/2019   Constipation 02/17/2019   Witnessed seizure-like activity (HCC) 02/03/2019   Chronic systolic HF (heart failure) (HCC) 10/08/2018   Educated about COVID-19 virus infection 10/08/2018   Dementia (HCC) 02/10/2018   S/P shoulder replacement, right 08/22/2017   Essential hypertension 02/19/2016   Voiding dysfunction 10/07/2014   SUI (stress urinary incontinence, female) 10/07/2014   Pathological fracture due to osteoporosis 04/06/2014   DOE (dyspnea on exertion) 07/23/2013   Helicobacter positive gastritis 06/21/2013   Orthostatic hypotension 06/10/2013   Recurrent UTI 05/06/2013   Neck mass 02/19/2013   Right groin mass 11/08/2011   Low back pain 11/08/2011   Lipoma of neck 06/21/2011    OVERACTIVE BLADDER 08/21/2010   Long term current use of anticoagulant 07/25/2010   Osteoarthritis 01/18/2010   KNEE PAIN 01/18/2010   FOOT PAIN 01/18/2010   Mixed hyperlipidemia 04/20/2009   GERD 04/17/2009   MITRAL REGURGITATION, MILD 08/30/2008   NICM (nonischemic cardiomyopathy) (HCC) 08/30/2008   Depression, major, single episode, moderate (HCC) 04/10/2007   DISEASE, MITRAL VALVE NEC/NOS 04/10/2007   Atrial fibrillation (HCC) 04/10/2007   STASIS ULCER 04/10/2007   VENOUS INSUFFICIENCY, LEGS 04/10/2007    Orientation RESPIRATION BLADDER Height & Weight     Self, Time, Situation, Place  O2 (2L) Continent Weight: 235 lb 7.2 oz (106.8 kg) Height:  5\' 11"  (180.3 cm)  BEHAVIORAL SYMPTOMS/MOOD NEUROLOGICAL BOWEL NUTRITION STATUS      Continent Diet (Regular)  AMBULATORY STATUS COMMUNICATION OF NEEDS Skin   Limited Assist Verbally Other (Comment) (Redness to groin/perineum)                       Personal Care Assistance Level of Assistance  Bathing, Feeding, Dressing Bathing Assistance: Limited assistance Feeding assistance: Limited assistance Dressing Assistance: Limited assistance     Functional Limitations Info  Sight, Hearing, Speech Sight Info: Impaired Hearing Info: Impaired Speech Info: Adequate    SPECIAL CARE FACTORS FREQUENCY  PT (By licensed PT)     PT Frequency: 3x weekly              Contractures      Additional Factors Info  Code Status, Allergies, Psychotropic Code Status Info: Full code Allergies Info: Bactrim (sulfamethoxazole-trimethoprim), Lipitor (atorvastatin Calcium), Lisinopril Psychotropic Info: Depakote, Cymbalta, Seroquel  Current Medications (04/04/2023):  This is the current hospital active medication list Current Facility-Administered Medications  Medication Dose Route Frequency Provider Last Rate Last Admin   acetaminophen (TYLENOL) tablet 650 mg  650 mg Oral Q6H PRN Nolberto Hanlon, MD       Or   acetaminophen  (TYLENOL) suppository 650 mg  650 mg Rectal Q6H PRN Nolberto Hanlon, MD       acetylcysteine (MUCOMYST) 20 % nebulizer / oral solution 4 mL  4 mL Nebulization TID Nolberto Hanlon, MD   4 mL at 04/04/23 0827   albuterol (PROVENTIL) (2.5 MG/3ML) 0.083% nebulizer solution 2.5 mg  2.5 mg Nebulization Q6H PRN Johnson, Clanford L, MD       allopurinol (ZYLOPRIM) tablet 100 mg  100 mg Oral Daily Nolberto Hanlon, MD   100 mg at 04/04/23 1000   apixaban (ELIQUIS) tablet 5 mg  5 mg Oral Q12H Nolberto Hanlon, MD   5 mg at 04/04/23 1000   cefTRIAXone (ROCEPHIN) 2 g in sodium chloride 0.9 % 100 mL IVPB  2 g Intravenous Q24H Nolberto Hanlon, MD 200 mL/hr at 04/03/23 1407 2 g at 04/03/23 1407   cholecalciferol (VITAMIN D3) 25 MCG (1000 UNIT) tablet 1,000 Units  1,000 Units Oral Daily Nolberto Hanlon, MD   1,000 Units at 04/04/23 1000   dextromethorphan (DELSYM) 30 MG/5ML liquid 30 mg  30 mg Oral BID PRN Laural Benes, Clanford L, MD       divalproex (DEPAKOTE ER) 24 hr tablet 250 mg  250 mg Oral Daily Nolberto Hanlon, MD   250 mg at 04/03/23 5284   DULoxetine (CYMBALTA) DR capsule 30 mg  30 mg Oral Daily Nolberto Hanlon, MD   30 mg at 04/04/23 1000   fenofibrate tablet 54 mg  54 mg Oral Daily Nolberto Hanlon, MD   54 mg at 04/04/23 1000   folic acid (FOLVITE) tablet 1 mg  1 mg Oral Daily Johnson, Clanford L, MD       guaiFENesin (MUCINEX) 12 hr tablet 600 mg  600 mg Oral BID Johnson, Clanford L, MD   600 mg at 04/04/23 1000   ipratropium-albuterol (DUONEB) 0.5-2.5 (3) MG/3ML nebulizer solution 3 mL  3 mL Nebulization TID Laural Benes, Clanford L, MD   3 mL at 04/04/23 0827   linaclotide (LINZESS) capsule 72 mcg  72 mcg Oral QAC breakfast Nolberto Hanlon, MD   72 mcg at 04/04/23 0800   metoprolol tartrate (LOPRESSOR) tablet 12.5 mg  12.5 mg Oral TID Nolberto Hanlon, MD   12.5 mg at 04/04/23 1000   midodrine (PROAMATINE) tablet 2.5 mg  2.5 mg Oral TID WC Nolberto Hanlon, MD   2.5 mg at 04/04/23 0800   ondansetron (ZOFRAN) injection 4 mg  4 mg Intravenous Q6H PRN Laural Benes,  Clanford L, MD   4 mg at 04/04/23 0455   Oral care mouth rinse  15 mL Mouth Rinse PRN Johnson, Clanford L, MD       pantoprazole (PROTONIX) EC tablet 40 mg  40 mg Oral BID Nolberto Hanlon, MD   40 mg at 04/04/23 1000   polyethylene glycol (MIRALAX / GLYCOLAX) packet 17 g  17 g Oral Daily PRN Nolberto Hanlon, MD       predniSONE (DELTASONE) tablet 40 mg  40 mg Oral Q breakfast Johnson, Clanford L, MD   40 mg at 04/04/23 0800   QUEtiapine (SEROQUEL) tablet 50 mg  50 mg Oral BID Nolberto Hanlon, MD   50 mg at 04/04/23 1000   rosuvastatin (CRESTOR) tablet  20 mg  20 mg Oral Daily Nolberto Hanlon, MD   20 mg at 04/04/23 1000   sodium chloride flush (NS) 0.9 % injection 3 mL  3 mL Intravenous Q12H Nolberto Hanlon, MD   3 mL at 04/03/23 2111     Discharge Medications: allopurinol 100 MG tablet Commonly known as: ZYLOPRIM Take 100 mg by mouth daily.    dextromethorphan 30 MG/5ML liquid Commonly known as: DELSYM Take 5 mLs (30 mg total) by mouth 2 (two) times daily as needed for cough.    divalproex 250 MG 24 hr tablet Commonly known as: DEPAKOTE ER Take 1 tablet (250 mg total) by mouth daily.    docusate sodium 100 MG capsule Commonly known as: COLACE Take 100 mg by mouth daily.    doxycycline 100 MG capsule Commonly known as: VIBRAMYCIN Take 1 capsule (100 mg total) by mouth 2 (two) times daily for 5 days.    DULoxetine 30 MG capsule Commonly known as: CYMBALTA Take 1 capsule (30 mg total) by mouth daily.    Eliquis 5 MG Tabs tablet Generic drug: apixaban TAKE 1 TABLET BY MOUTH EVERY 12 HOURS    fenofibrate 48 MG tablet Commonly known as: TRICOR Take 48 mg by mouth daily.    folic acid 1 MG tablet Commonly known as: FOLVITE Take 1 mg by mouth daily.    guaiFENesin 600 MG 12 hr tablet Commonly known as: MUCINEX Take 1 tablet (600 mg total) by mouth 2 (two) times daily for 5 days.    ipratropium-albuterol 0.5-2.5 (3) MG/3ML Soln Commonly known as: DUONEB Take 3 mLs by nebulization 3 (three)  times daily.    OXYGEN Inhale into the lungs. 2 liters PRN - anytime when needed ( cardio rx'd)    polyethylene glycol 17 g packet Commonly known as: MIRALAX / GLYCOLAX Take 17 g by mouth daily as needed for mild constipation.    predniSONE 20 MG tablet Commonly known as: DELTASONE Take 2 tablets (40 mg total) by mouth daily with breakfast for 5 days. Start taking on: April 05, 2023      Relevant Imaging Results:  Relevant Lab Results:   Additional Information HHPT with [facility]  Reubin Milan, Gasper Lloyd, LCSW

## 2023-04-04 NOTE — Progress Notes (Signed)
SATURATION QUALIFICATIONS: (This note is used to comply with regulatory documentation for home oxygen)  Patient Saturations on Room Air at Rest = 88%  Patient Saturations on Room Air while Ambulating = 86%  Patient Saturations on 2 Liters of oxygen while Ambulating = 98%  Please briefly explain why patient needs home oxygen: pt desat when ambulating on RA

## 2023-04-04 NOTE — TOC Transition Note (Addendum)
Transition of Care [facility]) - CM/SW Discharge Note   Patient Details  Name: Virginia Young MRN: [id number] Date of Birth: 19-Oct-1940  Transition of Care [facility]) CM/SW Contact:  Karn Cassis, LCSW Phone Number: 04/04/2023, 1:30 PM   Clinical Narrative: Pt d/c today back to [facility]. Pt's daughter notified. Discussed PT recommendation for HHPT. No preference on agency. Referred and accepted by Maralyn Sago with [facility] aware of d/c today. Pt will need nebulizer and home O2. No preference on agency. Referred to Marylene Land with [facility] who will deliver to room. Will complete FL2 and send to ALF with d/c summary.       Final next level of care: Assisted Living Barriers to Discharge: Barriers Resolved   Patient Goals and CMS Choice   Choice offered to / list presented to : Patient  Discharge Placement                  Patient to be transferred to facility by: ALF transport Name of family member notified: daughter Patient and family notified of of transfer: 04/04/23  Discharge Plan and Services Additional resources added to the After Visit Summary for   In-house Referral: Clinical Social Work              DME Arranged: Community education officer, Oxygen DME Agency: Other - Comment ([facility]) Date DME Agency Contacted: 04/04/23 Time DME Agency Contacted: 1329 Representative spoke with at DME Agency: Marylene Land HH Arranged: PT HH Agency: Other - See comment [facility]) Date HH Agency Contacted: 04/04/23 Time HH Agency Contacted: 1329 Representative spoke with at [facility] Agency: Maralyn Sago  Social Determinants of Health (SDOH) Interventions SDOH Screenings   Food Insecurity: No Food Insecurity (04/02/2023)  Housing: Low Risk  (04/02/2023)  Transportation Needs: No Transportation Needs (04/02/2023)  Utilities: Not At Risk (04/02/2023)  Alcohol Screen: Low Risk  (03/13/2022)  Depression (PHQ2-9): Low Risk  (03/13/2022)  Financial Resource Strain: Low Risk  (03/13/2022)  Physical Activity:  Inactive (03/13/2022)  Social Connections: Moderately Isolated (03/13/2022)  Stress: No Stress Concern Present (03/13/2022)  Tobacco Use: Low Risk  (04/03/2023)     Readmission Risk Interventions     No data to display

## 2023-04-04 NOTE — Plan of Care (Signed)
  Problem: Acute Rehab PT Goals(only PT should resolve) Goal: Pt Will Go Supine/Side To Sit Outcome: Progressing Flowsheets (Taken 04/04/2023 1106) Pt will go Supine/Side to Sit: with contact guard assist Goal: Patient Will Transfer Sit To/From Stand Outcome: Progressing Flowsheets (Taken 04/04/2023 1106) Patient will transfer sit to/from stand: with minimal assist Goal: Pt Will Transfer Bed To Chair/Chair To Bed Outcome: Progressing Flowsheets (Taken 04/04/2023 1106) Pt will Transfer Bed to Chair/Chair to Bed: with min assist Goal: Pt Will Ambulate Outcome: Progressing Flowsheets (Taken 04/04/2023 1106) Pt will Ambulate:  25 feet  with minimal assist  with rolling walker

## 2023-04-04 NOTE — Evaluation (Signed)
Physical Therapy Evaluation Patient Details Name: Virginia Young MRN: [id number] DOB: 1940-07-17 Today's Date: 04/04/2023  History of Present Illness  Virginia Young is a 82 y.o. female with medical history significant of had arthritis for which patient manages walking with walker.  Patient does not typically use supplementary oxygen at home.  Patient was in her usual state of health till about 7 days ago when she reports new onset of cough with scant clear sputum production.  Patient did not have any fever chest pain or leg swelling.  However patient has been feeling short of breath for the last 48 hours but especially today.  Prompting her to be brought to [facility], ER by EMS.  Report obtained indicates that the patient was hypoxic in the 80s with EMS.  Patient was stabilized on 2 L/min of supplementary oxygen on which she has been since then.     Patient does not report any complaints at this time besides the bothersome cough.  Which is producing very scant sputum.    Clinical Impression  On therapist arrival; patient is lying in bed with HOB elevated with her breakfast tray.  She is a poor historian but she is agreeable to physical therapy assessment and is able to follow commands.  Patient performs supine to sit with HOB elevated and needs min A and use of bed rail to pull her trunk fully to sitting due to leg and trunk weakness.  Patient needs mod assist for initial boost up to standing with RW.  She demonstrates flexed trunk and shuffling gait pattern due to leg and trunk weakness with taking a few steps over to the chair.  Once sitting in the chair; patient O2 saturation is at 90% on 2 L of O2.  patient left in chair with chair alarm set with call button in reach and nursing notified of mobility status.  Patient will benefit from continued skilled therapy services during the remainder of her hospital stay and at the next recommended venue of care to address deficits and promote return to  optimal function.             If plan is discharge home, recommend the following: A little help with walking and/or transfers;A little help with bathing/dressing/bathroom;Assist for transportation;Assistance with cooking/housework   Can travel by private vehicle        Equipment Recommendations None recommended by PT  Recommendations for Other Services       Functional Status Assessment Patient has had a recent decline in their functional status and demonstrates the ability to make significant improvements in function in a reasonable and predictable amount of time.     Precautions / Restrictions Precautions Precautions: Fall Restrictions Weight Bearing Restrictions: No      Mobility  Bed Mobility Overal bed mobility: Needs Assistance Bed Mobility: Supine to Sit     Supine to sit: HOB elevated, Used rails, Min assist     General bed mobility comments: min to mod A to bring trunk fully upright and assist with bringing legs around for feet to be fully on the floor Patient Response: Cooperative  Transfers Overall transfer level: Needs assistance Equipment used: Rolling walker (2 wheels) Transfers: Sit to/from Stand, Bed to chair/wheelchair/BSC Sit to Stand: Mod assist   Step pivot transfers: Min assist            Ambulation/Gait Ambulation/Gait assistance: Min assist Gait Distance (Feet): 2 Feet Assistive device: Rolling walker (2 wheels) Gait Pattern/deviations: Shuffle, Trunk flexed  General Gait Details: decreased  Stairs            Wheelchair Mobility     Tilt Bed Tilt Bed Patient Response: Cooperative  Modified Rankin (Stroke Patients Only)       Balance Overall balance assessment: Needs assistance Sitting-balance support: Bilateral upper extremity supported, Feet supported Sitting balance-Leahy Scale: Fair Sitting balance - Comments: fair to good sitting balance on the edge of the bed; tends to sit with trunk flexed    Standing balance support: Bilateral upper extremity supported, During functional activity, Reliant on assistive device for balance Standing balance-Leahy Scale: Fair Standing balance comment: fair standing balance with RW; leans heavily on her arms due to leg weakness                             Pertinent Vitals/Pain Pain Assessment Pain Assessment: Faces Faces Pain Scale: Hurts a little bit Pain Location: stomach sore "from coughing" Pain Descriptors / Indicators: Sore Pain Intervention(s): Limited activity within patient's tolerance, Monitored during session, Repositioned    Home Living Family/patient expects to be discharged to:: Skilled nursing facility                   Additional Comments: unsure if it is SNF or AL per patient description; she is not a good historian; unable to name SNF    Prior Function Prior Level of Function : Needs assist                     Extremity/Trunk Assessment   Upper Extremity Assessment Upper Extremity Assessment: Generalized weakness    Lower Extremity Assessment Lower Extremity Assessment: Generalized weakness    Cervical / Trunk Assessment Cervical / Trunk Assessment: Kyphotic  Communication   Communication Communication: No apparent difficulties  Cognition Arousal: Alert Behavior During Therapy: WFL for tasks assessed/performed Overall Cognitive Status: No family/caregiver present to determine baseline cognitive functioning                                 General Comments: poor historian but can follow commands        General Comments      Exercises     Assessment/Plan    PT Assessment Patient needs continued PT services  PT Problem List Decreased strength;Decreased activity tolerance;Decreased balance;Decreased mobility       PT Treatment Interventions Gait training;Functional mobility training;Therapeutic activities;Therapeutic exercise;Balance training;Patient/family  education    PT Goals (Current goals can be found in the Care Plan section)  Acute Rehab PT Goals Patient Stated Goal: return to assisted living PT Goal Formulation: With patient Time For Goal Achievement: 04/18/23 Potential to Achieve Goals: Good    Frequency Min 2X/week     Co-evaluation               AM-PAC PT "6 Clicks" Mobility  Outcome Measure Help needed turning from your back to your side while in a flat bed without using bedrails?: A Little Help needed moving from lying on your back to sitting on the side of a flat bed without using bedrails?: A Little Help needed moving to and from a bed to a chair (including a wheelchair)?: A Little Help needed standing up from a chair using your arms (e.g., wheelchair or bedside chair)?: A Little Help needed to walk in hospital room?: A Lot Help needed climbing 3-5 steps with a  railing? : A Lot 6 Click Score: 16    End of Session Equipment Utilized During Treatment: Oxygen Activity Tolerance: Patient tolerated treatment well Patient left: in chair;with call bell/phone within reach;with chair alarm set Nurse Communication: Mobility status PT Visit Diagnosis: Unsteadiness on feet (R26.81);Other abnormalities of gait and mobility (R26.89);Muscle weakness (generalized) (M62.81)    Time: 4401-0272 PT Time Calculation (min) (ACUTE ONLY): 16 min   Charges:   PT Evaluation $PT Eval Low Complexity: 1 Low   PT General Charges $$ ACUTE PT VISIT: 1 Visit         11:05 AM, 04/04/23 Zaineb Nowaczyk Small Laniesha Das MPT [facility] [place] [phone] Ph:[phone]

## 2023-12-27 ENCOUNTER — Emergency Department (HOSPITAL_COMMUNITY)

## 2023-12-27 ENCOUNTER — Inpatient Hospital Stay (HOSPITAL_COMMUNITY)
Admission: EM | Admit: 2023-12-27 | Discharge: 2023-12-29 | DRG: 193 | Disposition: A | Discharge status: Home Health | Source: Skilled Nursing Facility | Attending: Internal Medicine | Admitting: Internal Medicine

## 2023-12-27 ENCOUNTER — Other Ambulatory Visit: Payer: Self-pay

## 2023-12-27 DIAGNOSIS — Z7901 Long term (current) use of anticoagulants: Secondary | ICD-10-CM | POA: Diagnosis not present

## 2023-12-27 DIAGNOSIS — J189 Pneumonia, unspecified organism: Secondary | ICD-10-CM | POA: Diagnosis present

## 2023-12-27 DIAGNOSIS — Z809 Family history of malignant neoplasm, unspecified: Secondary | ICD-10-CM

## 2023-12-27 DIAGNOSIS — G35 Multiple sclerosis: Secondary | ICD-10-CM | POA: Diagnosis present

## 2023-12-27 DIAGNOSIS — Z8249 Family history of ischemic heart disease and other diseases of the circulatory system: Secondary | ICD-10-CM | POA: Diagnosis not present

## 2023-12-27 DIAGNOSIS — Z823 Family history of stroke: Secondary | ICD-10-CM

## 2023-12-27 DIAGNOSIS — Z881 Allergy status to other antibiotic agents status: Secondary | ICD-10-CM

## 2023-12-27 DIAGNOSIS — J40 Bronchitis, not specified as acute or chronic: Secondary | ICD-10-CM | POA: Diagnosis present

## 2023-12-27 DIAGNOSIS — W010XXA Fall on same level from slipping, tripping and stumbling without subsequent striking against object, initial encounter: Secondary | ICD-10-CM | POA: Diagnosis present

## 2023-12-27 DIAGNOSIS — E785 Hyperlipidemia, unspecified: Secondary | ICD-10-CM

## 2023-12-27 DIAGNOSIS — E119 Type 2 diabetes mellitus without complications: Secondary | ICD-10-CM | POA: Diagnosis present

## 2023-12-27 DIAGNOSIS — E871 Hypo-osmolality and hyponatremia: Secondary | ICD-10-CM | POA: Diagnosis present

## 2023-12-27 DIAGNOSIS — I428 Other cardiomyopathies: Secondary | ICD-10-CM | POA: Diagnosis present

## 2023-12-27 DIAGNOSIS — S0990XA Unspecified injury of head, initial encounter: Principal | ICD-10-CM

## 2023-12-27 DIAGNOSIS — E876 Hypokalemia: Secondary | ICD-10-CM | POA: Diagnosis not present

## 2023-12-27 DIAGNOSIS — Z6833 Body mass index (BMI) 33.0-33.9, adult: Secondary | ICD-10-CM | POA: Diagnosis not present

## 2023-12-27 DIAGNOSIS — Z79899 Other long term (current) drug therapy: Secondary | ICD-10-CM | POA: Diagnosis not present

## 2023-12-27 DIAGNOSIS — I5022 Chronic systolic (congestive) heart failure: Secondary | ICD-10-CM | POA: Diagnosis present

## 2023-12-27 DIAGNOSIS — E66811 Obesity, class 1: Secondary | ICD-10-CM | POA: Diagnosis present

## 2023-12-27 DIAGNOSIS — Z833 Family history of diabetes mellitus: Secondary | ICD-10-CM

## 2023-12-27 DIAGNOSIS — E878 Other disorders of electrolyte and fluid balance, not elsewhere classified: Secondary | ICD-10-CM | POA: Diagnosis present

## 2023-12-27 DIAGNOSIS — M109 Gout, unspecified: Secondary | ICD-10-CM | POA: Diagnosis present

## 2023-12-27 DIAGNOSIS — I480 Paroxysmal atrial fibrillation: Secondary | ICD-10-CM | POA: Diagnosis present

## 2023-12-27 DIAGNOSIS — Z820 Family history of epilepsy and other diseases of the nervous system: Secondary | ICD-10-CM

## 2023-12-27 DIAGNOSIS — J9621 Acute and chronic respiratory failure with hypoxia: Secondary | ICD-10-CM | POA: Diagnosis present

## 2023-12-27 DIAGNOSIS — Z96611 Presence of right artificial shoulder joint: Secondary | ICD-10-CM | POA: Diagnosis present

## 2023-12-27 DIAGNOSIS — I340 Nonrheumatic mitral (valve) insufficiency: Secondary | ICD-10-CM | POA: Diagnosis present

## 2023-12-27 DIAGNOSIS — S7001XA Contusion of right hip, initial encounter: Secondary | ICD-10-CM

## 2023-12-27 DIAGNOSIS — J9601 Acute respiratory failure with hypoxia: Secondary | ICD-10-CM

## 2023-12-27 DIAGNOSIS — E7800 Pure hypercholesterolemia, unspecified: Secondary | ICD-10-CM | POA: Diagnosis present

## 2023-12-27 DIAGNOSIS — M810 Age-related osteoporosis without current pathological fracture: Secondary | ICD-10-CM | POA: Diagnosis present

## 2023-12-27 DIAGNOSIS — Z888 Allergy status to other drugs, medicaments and biological substances status: Secondary | ICD-10-CM

## 2023-12-27 DIAGNOSIS — Z9981 Dependence on supplemental oxygen: Secondary | ICD-10-CM | POA: Diagnosis not present

## 2023-12-27 DIAGNOSIS — M1990 Unspecified osteoarthritis, unspecified site: Secondary | ICD-10-CM | POA: Diagnosis present

## 2023-12-27 LAB — COMPREHENSIVE METABOLIC PANEL WITH GFR
ALT: 13 U/L (ref 0–44)
AST: 18 U/L (ref 15–41)
Albumin: 3 g/dL — ABNORMAL LOW (ref 3.5–5.0)
Alkaline Phosphatase: 85 U/L (ref 38–126)
Anion gap: 9 (ref 5–15)
BUN: 14 mg/dL (ref 8–23)
CO2: 29 mmol/L (ref 22–32)
Calcium: 8.5 mg/dL — ABNORMAL LOW (ref 8.9–10.3)
Chloride: 94 mmol/L — ABNORMAL LOW (ref 98–111)
Creatinine, Ser: 0.89 mg/dL (ref 0.44–1.00)
GFR, Estimated: >60 mL/min (ref >60)
Glucose, Bld: 127 mg/dL — ABNORMAL HIGH (ref 70–99)
Potassium: 3.7 mmol/L (ref 3.5–5.1)
Sodium: 132 mmol/L — ABNORMAL LOW (ref 135–145)
Total Bilirubin: 0.7 mg/dL (ref 0.0–1.2)
Total Protein: 6.3 g/dL — ABNORMAL LOW (ref 6.5–8.1)

## 2023-12-27 LAB — CBC WITH DIFFERENTIAL/PLATELET
Abs Immature Granulocytes: 0.04 K/uL (ref 0.00–0.07)
Basophils Absolute: 0.1 K/uL (ref 0.0–0.1)
Basophils Relative: 1 %
Eosinophils Absolute: 0.5 K/uL (ref 0.0–0.5)
Eosinophils Relative: 6 %
HCT: 38.8 % (ref 36.0–46.0)
Hemoglobin: 12.8 g/dL (ref 12.0–15.0)
Immature Granulocytes: 0 %
Lymphocytes Relative: 19 %
Lymphs Abs: 1.8 K/uL (ref 0.7–4.0)
MCH: 28.9 pg (ref 26.0–34.0)
MCHC: 33 g/dL (ref 30.0–36.0)
MCV: 87.6 fL (ref 80.0–100.0)
Monocytes Absolute: 1 K/uL (ref 0.1–1.0)
Monocytes Relative: 11 %
Neutro Abs: 5.8 K/uL (ref 1.7–7.7)
Neutrophils Relative %: 63 %
Platelets: 257 K/uL (ref 150–400)
RBC: 4.43 MIL/uL (ref 3.87–5.11)
RDW: 14.2 % (ref 11.5–15.5)
WBC: 9.2 K/uL (ref 4.0–10.5)
nRBC: 0 % (ref 0.0–0.2)

## 2023-12-27 MED ORDER — FENOFIBRATE 54 MG PO TABS
54.0000 mg | ORAL_TABLET | Freq: Every day | ORAL | Status: DC
  Administered 2023-12-28 – 2023-12-29 (×2): 54 mg via ORAL
  Filled 2023-12-27 (×4): qty 1

## 2023-12-27 MED ORDER — SODIUM CHLORIDE 0.9 % IV SOLN: INTRAVENOUS | Status: DC

## 2023-12-27 MED ORDER — DIVALPROEX SODIUM ER 250 MG PO TB24
250.0000 mg | ORAL_TABLET | Freq: Every day | ORAL | Status: DC
  Administered 2023-12-28 – 2023-12-29 (×2): 250 mg via ORAL
  Filled 2023-12-27 (×4): qty 1

## 2023-12-27 MED ORDER — ACETAMINOPHEN 325 MG PO TABS: 650.0000 mg | ORAL_TABLET | Freq: Four times a day (QID) | ORAL | Status: DC | PRN

## 2023-12-27 MED ORDER — SODIUM CHLORIDE 0.9 % IV SOLN
2.0000 g | INTRAVENOUS | Status: DC
  Administered 2023-12-27 – 2023-12-28 (×2): 2 g via INTRAVENOUS
  Filled 2023-12-27 (×2): qty 20

## 2023-12-27 MED ORDER — ACETAMINOPHEN 325 MG PO TABS
650.0000 mg | ORAL_TABLET | Freq: Once | ORAL | Status: DC
Start: 2023-12-27 — End: 2023-12-27
  Filled 2023-12-27: qty 2

## 2023-12-27 MED ORDER — SODIUM CHLORIDE 0.9 % IV SOLN
500.0000 mg | INTRAVENOUS | Status: DC
  Administered 2023-12-27 – 2023-12-28 (×2): 500 mg via INTRAVENOUS
  Filled 2023-12-27 (×2): qty 5

## 2023-12-27 MED ORDER — POLYETHYLENE GLYCOL 3350 17 G PO PACK: 17.0000 g | PACK | Freq: Every day | ORAL | Status: DC | PRN

## 2023-12-27 MED ORDER — AZITHROMYCIN 250 MG PO TABS: 500.0000 mg | ORAL_TABLET | Freq: Once | ORAL | Status: DC

## 2023-12-27 MED ORDER — DULOXETINE HCL 60 MG PO CPEP
60.0000 mg | ORAL_CAPSULE | Freq: Every day | ORAL | Status: DC
  Administered 2023-12-28 – 2023-12-29 (×2): 60 mg via ORAL
  Filled 2023-12-27 (×3): qty 1

## 2023-12-27 MED ORDER — HYDROCODONE-ACETAMINOPHEN 5-325 MG PO TABS
1.0000 | ORAL_TABLET | Freq: Once | ORAL | Status: AC
  Administered 2023-12-27: 1 via ORAL
  Filled 2023-12-27: qty 1

## 2023-12-27 MED ORDER — FENTANYL CITRATE PF 50 MCG/ML IJ SOSY
50.0000 ug | PREFILLED_SYRINGE | Freq: Once | INTRAMUSCULAR | Status: AC
  Administered 2023-12-27: 50 ug via INTRAMUSCULAR
  Filled 2023-12-27: qty 1

## 2023-12-27 MED ORDER — FOLIC ACID 1 MG PO TABS
1.0000 mg | ORAL_TABLET | Freq: Every day | ORAL | Status: DC
  Administered 2023-12-28 – 2023-12-29 (×2): 1 mg via ORAL
  Filled 2023-12-27 (×3): qty 1

## 2023-12-27 MED ORDER — ALLOPURINOL 100 MG PO TABS
100.0000 mg | ORAL_TABLET | Freq: Every day | ORAL | Status: DC
  Administered 2023-12-28 – 2023-12-29 (×2): 100 mg via ORAL
  Filled 2023-12-27 (×3): qty 1

## 2023-12-27 MED ORDER — IPRATROPIUM-ALBUTEROL 0.5-2.5 (3) MG/3ML IN SOLN
3.0000 mL | Freq: Three times a day (TID) | RESPIRATORY_TRACT | Status: DC
  Administered 2023-12-27: 3 mL via RESPIRATORY_TRACT
  Filled 2023-12-27 (×2): qty 3

## 2023-12-27 MED ORDER — IPRATROPIUM-ALBUTEROL 0.5-2.5 (3) MG/3ML IN SOLN: 3.0000 mL | Freq: Once | RESPIRATORY_TRACT | Status: DC

## 2023-12-27 MED ORDER — LIDOCAINE 5 % EX PTCH
1.0000 | MEDICATED_PATCH | CUTANEOUS | Status: DC
  Administered 2023-12-27 – 2023-12-28 (×2): 1 via TRANSDERMAL
  Filled 2023-12-27 (×2): qty 1

## 2023-12-27 MED ORDER — ONDANSETRON HCL 4 MG/2ML IJ SOLN: 4.0000 mg | Freq: Four times a day (QID) | INTRAMUSCULAR | Status: DC | PRN

## 2023-12-27 MED ORDER — ACETAMINOPHEN 650 MG RE SUPP: 650.0000 mg | Freq: Four times a day (QID) | RECTAL | Status: DC | PRN

## 2023-12-27 MED ORDER — APIXABAN 5 MG PO TABS
5.0000 mg | ORAL_TABLET | Freq: Two times a day (BID) | ORAL | Status: DC
  Administered 2023-12-27 – 2023-12-29 (×4): 5 mg via ORAL
  Filled 2023-12-27 (×5): qty 1

## 2023-12-27 MED ORDER — SODIUM CHLORIDE 0.9 % IV SOLN: 1.0000 g | Freq: Once | INTRAVENOUS | Status: DC

## 2023-12-27 MED ORDER — DOCUSATE SODIUM 100 MG PO CAPS
100.0000 mg | ORAL_CAPSULE | Freq: Every day | ORAL | Status: DC
  Administered 2023-12-28 – 2023-12-29 (×2): 100 mg via ORAL
  Filled 2023-12-27 (×3): qty 1

## 2023-12-27 MED ORDER — ONDANSETRON HCL 4 MG PO TABS: 4.0000 mg | ORAL_TABLET | Freq: Four times a day (QID) | ORAL | Status: DC | PRN

## 2023-12-27 MED ORDER — TRAZODONE HCL 50 MG PO TABS
25.0000 mg | ORAL_TABLET | Freq: Every evening | ORAL | Status: DC | PRN
  Administered 2023-12-27: 25 mg via ORAL
  Filled 2023-12-27: qty 1

## 2023-12-27 NOTE — ED Notes (Signed)
 This tech ambulated pt and oxygen  dropped to 72, pt walked 10 feet.

## 2023-12-27 NOTE — ED Notes (Addendum)
 Pt able to stand at beside with two person assist, very unsteady, patient c/o dizziness and sob, O2 on 3L [place] is 85%. pt returned to the bed without ambulating

## 2023-12-27 NOTE — ED Triage Notes (Signed)
 Pt presented to ED via [facility]. EMS called out due to pt fall going to bathroom. Denies LOC. Pt on blood thinners. Main complaint of hip and back pain with minor shortness of breath. EMS placed pt on 3 lpm o2 [place].

## 2023-12-27 NOTE — ED Notes (Signed)
 Patient transported to CT

## 2023-12-27 NOTE — ED Notes (Signed)
 O2 bumped up to 4 L [place] after attempting to get pt up, pt now at 96% on 4L

## 2023-12-27 NOTE — ED Provider Notes (Addendum)
 [place] EMERGENCY DEPARTMENT AT [facility] Provider Note   CSN: [id number] Arrival date & time: 12/27/23  1544     Patient presents with: Virginia Young is a 82 y.o. female.  History of high cholesterol, A-fib on Eliquis , nonischemic cardiomyopathy, and chronic respiratory failure on home oxygen .  Presents the ER today for evaluation of a fall.  She states she was walking the bathroom, bent over to pick up a pair of pants up and was trying to grab onto a doorknob to help her maintain balance but missed the doorknob and fell to her right side, striking the right side of her head and right hip on the ground.  She is having pain in the low back, right hip right knee and pain to the right occipital area.  She denies loss of consciousness.  He has she is noted to be on Eliquis .  No numbness tingling or weakness.  She is not having any dizziness, chest pain or shortness of breath.    Fall       Prior to Admission medications   Medication Sig Start Date End Date Taking? Authorizing Provider  allopurinol  (ZYLOPRIM ) 100 MG tablet Take 100 mg by mouth daily.    [provider]  dextromethorphan  (DELSYM ) 30 MG/5ML liquid Take 5 mLs (30 mg total) by mouth 2 (two) times daily as needed for cough. 04/04/23   Johnson, Clanford L, MD  divalproex  (DEPAKOTE  ER) 250 MG 24 hr tablet Take 1 tablet (250 mg total) by mouth daily. 09/23/20   Samtani, Jai-Gurmukh, MD  docusate sodium  (COLACE) 100 MG capsule Take 100 mg by mouth daily.    [provider]  DULoxetine  (CYMBALTA ) 30 MG capsule Take 1 capsule (30 mg total) by mouth daily. 09/23/20   Samtani, Jai-Gurmukh, MD  ELIQUIS  5 MG TABS tablet TAKE 1 TABLET BY MOUTH EVERY 12 HOURS 11/16/20   Hawks, Bari A, FNP  fenofibrate  (TRICOR ) 48 MG tablet Take 48 mg by mouth daily. 09/25/21   [provider]  folic acid  (FOLVITE ) 1 MG tablet Take 1 mg by mouth daily.    [provider]  ipratropium-albuterol   (DUONEB) 0.5-2.5 (3) MG/3ML SOLN Take 3 mLs by nebulization 3 (three) times daily. 04/04/23   Vicci Afton CROME, MD  OXYGEN  Inhale into the lungs. 2 liters PRN - anytime when needed ( cardio rx'd)    [provider]  polyethylene glycol (MIRALAX  / GLYCOLAX ) 17 g packet Take 17 g by mouth daily as needed for mild constipation. 04/04/23   Vicci Afton CROME, MD    Allergies: Bactrim  [sulfamethoxazole -trimethoprim ], Hydrocortisone, Lipitor [atorvastatin  calcium ], and Lisinopril     Review of Systems  Updated Vital Signs BP (!) 156/71 (BP Location: Right Arm)   Pulse 71   Temp 97.9 F (36.6 C) (Oral)   Resp 18   Ht 5' 11 (1.803 m)   Wt 106.8 kg   SpO2 97%   BMI 32.84 kg/m   Physical Exam Vitals and nursing note reviewed.  Constitutional:      General: She is not in acute distress.    Appearance: She is well-developed.  HENT:     Head: Normocephalic and atraumatic.  Eyes:     Conjunctiva/sclera: Conjunctivae normal.  Cardiovascular:     Rate and Rhythm: Normal rate and regular rhythm.     Heart sounds: No murmur heard. Pulmonary:     Effort: Pulmonary effort is normal. No respiratory distress.     Breath sounds: Normal  breath sounds.  Abdominal:     Palpations: Abdomen is soft.     Tenderness: There is no abdominal tenderness.  Musculoskeletal:        General: No swelling.     Cervical back: Neck supple.     Comments: Tenderness to right lumbar and right sacral area.  No deformity to the right hip.  Mild tenderness to right hip and right knee.  Normal distal pulses in the feet bilaterally.  Sensation intact in bilateral lower extremities.  Normal strength in bilateral lower extremities.  Skin:    General: Skin is warm and dry.     Capillary Refill: Capillary refill takes less than 2 seconds.  Neurological:     General: No focal deficit present.     Mental Status: She is alert and oriented to person, place, and time.     Cranial Nerves: No cranial nerve deficit.      Sensory: No sensory deficit.     Motor: No weakness.  Psychiatric:        Mood and Affect: Mood normal.     (all labs ordered are listed, but only abnormal results are displayed) Labs Reviewed - No data to display  EKG: None  Radiology: No results found.   Procedures   Medications Ordered in the ED  fentaNYL  (SUBLIMAZE ) injection 50 mcg (has no administration in time range)                                    Medical Decision Making This patient presents to the ED for concern of pain after fall, head injury on Eliquis , this involves an extensive number of treatment options, and is a complaint that carries with it a high risk of complications and morbidity.  The differential diagnosis includes sprain, strain, fracture, contusion, concussion, intracranial hemorrhage, other   Co morbidities that complicate the patient evaluation :   Long-term anticoagulant use, A-fib, chronic respiratory failure   Additional history obtained:  Additional history obtained from EMR, nursing home record External records from outside source obtained and reviewed including medication list, notes, labs     Imaging Studies ordered:  I ordered imaging studies including stray right hip and pelvis which shows no fracture or dislocation, x-ray right knee shows arthritis but no fracture or dislocation, x-ray lumbar spine shows degenerative changes but no acute osseous abnormality, CT head without intracranial hemorrhage or skull fracture, CT C-spine without acute abnormality I independently visualized and interpreted imaging within scope of identifying emergent findings  I agree with the radiologist interpretation   Problem List / ED Course / Critical interventions / Medication management  Fall-patient had mechanical fall today, she was bending over trying to pick up clothing from the ground and lost her balance falling down striking her right side of her head right hip and knee.  She states  she was able to ambulate some but had a lot of pain and could not walk very far.  She uses a walker at baseline.  She had no LOC.  No dizziness or chest pain or palpitations prior to the fall.  She was given fentanyl  in the ER for pain control and had improvement of her pain.  Imaging was all reassuring.  Plan to ambulate patient, if she is able to ambulate well discharged with close outpatient follow-up.  Patient was not able to ambulate, desaturated to 85% and was having a lot of pain .  CT of spine and pelvis to rule out occult fractures and these did not show any acute fractures.  Her pain is not well-controlled but she still gets extremely short of breath on standing at her 2 L and desaturated to mid 70s and became extremely dyspneic.  Chest x-ray shows a right costophrenic angle infiltrate, patient does report that she has had a cough but she has no leukocytosis or fever.  Will treat for community-acquired pneumonia and plan on admission for acute on chronic hypoxic respiratory failure.  He is now on 4 L resting comfortably I ordered medication including fentanyl  for pain  Reevaluation of the patient after these medicines showed that the patient improved I have reviewed the patients home medicines and have made adjustments as needed   Social Determinants of Health:  Patient resides in assisted living  Consults:  Discussed with Dr. Lawence, [facility] hospitalist for admission     Amount and/or Complexity of Data Reviewed Labs: ordered. Radiology: ordered.  Risk OTC drugs. Prescription drug management. Decision regarding hospitalization.        Final diagnoses:  None    ED Discharge Orders     None          Suellen Sherran DELENA DEVONNA 12/27/23 2146    Suellen Sherran DELENA DEVONNA 12/27/23 2147    Franklyn Sid SAILOR, MD 12/27/23 [phone]

## 2023-12-28 DIAGNOSIS — I480 Paroxysmal atrial fibrillation: Secondary | ICD-10-CM | POA: Insufficient documentation

## 2023-12-28 DIAGNOSIS — E119 Type 2 diabetes mellitus without complications: Secondary | ICD-10-CM

## 2023-12-28 DIAGNOSIS — M109 Gout, unspecified: Secondary | ICD-10-CM | POA: Insufficient documentation

## 2023-12-28 DIAGNOSIS — J9601 Acute respiratory failure with hypoxia: Secondary | ICD-10-CM

## 2023-12-28 DIAGNOSIS — E785 Hyperlipidemia, unspecified: Secondary | ICD-10-CM

## 2023-12-28 DIAGNOSIS — J189 Pneumonia, unspecified organism: Secondary | ICD-10-CM | POA: Diagnosis not present

## 2023-12-28 LAB — HEMOGLOBIN A1C
Hgb A1c MFr Bld: 6.7 % — ABNORMAL HIGH (ref 4.8–5.6)
Mean Plasma Glucose: 145.59 mg/dL

## 2023-12-28 LAB — BASIC METABOLIC PANEL WITH GFR
Anion gap: 9 (ref 5–15)
BUN: 12 mg/dL (ref 8–23)
CO2: 29 mmol/L (ref 22–32)
Calcium: 8.3 mg/dL — ABNORMAL LOW (ref 8.9–10.3)
Chloride: 96 mmol/L — ABNORMAL LOW (ref 98–111)
Creatinine, Ser: 0.81 mg/dL (ref 0.44–1.00)
GFR, Estimated: >60 mL/min (ref >60)
Glucose, Bld: 104 mg/dL — ABNORMAL HIGH (ref 70–99)
Potassium: 3.4 mmol/L — ABNORMAL LOW (ref 3.5–5.1)
Sodium: 134 mmol/L — ABNORMAL LOW (ref 135–145)

## 2023-12-28 LAB — CBC
HCT: 39.9 % (ref 36.0–46.0)
Hemoglobin: 12.7 g/dL (ref 12.0–15.0)
MCH: 27.9 pg (ref 26.0–34.0)
MCHC: 31.8 g/dL (ref 30.0–36.0)
MCV: 87.5 fL (ref 80.0–100.0)
Platelets: 257 K/uL (ref 150–400)
RBC: 4.56 MIL/uL (ref 3.87–5.11)
RDW: 14.3 % (ref 11.5–15.5)
WBC: 8.4 K/uL (ref 4.0–10.5)
nRBC: 0 % (ref 0.0–0.2)

## 2023-12-28 LAB — GLUCOSE, CAPILLARY
Glucose-Capillary: 107 mg/dL — ABNORMAL HIGH (ref 70–99)
Glucose-Capillary: 181 mg/dL — ABNORMAL HIGH (ref 70–99)
Glucose-Capillary: 92 mg/dL (ref 70–99)
Glucose-Capillary: 129 mg/dL — ABNORMAL HIGH (ref 70–99)

## 2023-12-28 LAB — PROCALCITONIN: Procalcitonin: <0.1 ng/mL

## 2023-12-28 MED ORDER — IPRATROPIUM-ALBUTEROL 0.5-2.5 (3) MG/3ML IN SOLN
3.0000 mL | Freq: Four times a day (QID) | RESPIRATORY_TRACT | Status: DC | PRN
  Administered 2023-12-28 – 2023-12-29 (×2): 3 mL via RESPIRATORY_TRACT
  Filled 2023-12-28 (×2): qty 3

## 2023-12-28 MED ORDER — POTASSIUM CHLORIDE CRYS ER 20 MEQ PO TBCR
40.0000 meq | EXTENDED_RELEASE_TABLET | Freq: Two times a day (BID) | ORAL | Status: AC
  Administered 2023-12-28 (×2): 40 meq via ORAL
  Filled 2023-12-28 (×2): qty 2

## 2023-12-28 MED ORDER — INSULIN ASPART 100 UNIT/ML IJ SOLN: 0.0000 [IU] | Freq: Every day | INTRAMUSCULAR | Status: DC

## 2023-12-28 MED ORDER — INSULIN ASPART 100 UNIT/ML IJ SOLN
0.0000 [IU] | Freq: Three times a day (TID) | INTRAMUSCULAR | Status: DC
  Administered 2023-12-28: 3 [IU] via SUBCUTANEOUS
  Administered 2023-12-29: 5 [IU] via SUBCUTANEOUS

## 2023-12-28 MED ORDER — DM-GUAIFENESIN ER 30-600 MG PO TB12
1.0000 | ORAL_TABLET | Freq: Two times a day (BID) | ORAL | Status: DC
  Administered 2023-12-28 – 2023-12-29 (×3): 1 via ORAL
  Filled 2023-12-28 (×4): qty 1

## 2023-12-28 NOTE — H&P (Addendum)
 [place]   PATIENT NAME: Virginia Young    MR#:  [id number]  DATE OF BIRTH:  02-27-1941  DATE OF ADMISSION:  12/27/2023  PRIMARY CARE PHYSICIAN: Pia Kerney SQUIBB, MD   Patient is coming from: Home  REQUESTING/REFERRING PHYSICIAN: Suellen Cantor, PA-C  CHIEF COMPLAINT:   Chief Complaint  Patient presents with   Fall    HISTORY OF PRESENT ILLNESS:  ADLER ALTON is a 83 y.o. female with medical history significant for atrial fibrillation on Coumadin , chronic systolic CHF, type 2 diabetes mellitus, GERD, depression, anemia and MS as well as an ICM, osteoporosis and osteoarthritis, presented to the emergency room with acute onset of fall with subsequent occipital head injury without presyncope or syncope.  She has been having cough productive of yellow sputum as well as dyspnea and wheezing over the last few days.  No fever or chills.  No nausea or vomiting or abdominal pain.  No dysuria, oliguria or hematuria or flank pain.  She does not wear oxygen  at home per her report.  ED Course: When she came to the ER, BP was 156/71 with oximetry 97% on 2 L of O2 by nasal cannula.  Labs revealed hyponatremia and hypochloremia and calcium  8.5.  Total bili of 6.3 albumin 3.  CBC was normal. EKG as reviewed by me : None Imaging: 1. Right costophrenic angle nodule like airspace opacity. Followup PA and lateral chest X-ray is recommended in 3-4 weeks following therapy to ensure resolution. 2. Aortic Atherosclerosis (ICD10-I70.0) and Emphysema (ICD10-J43.9).  The patient was given 0.5 mg of IV Dilaudid and 4 mg of IV Zofran .  She will be admitted to a medical telemetry bed for further evaluation and management.  PAST MEDICAL HISTORY:   Past Medical History:  Diagnosis Date   Allergy     Atrial fibrillation (HCC)    coumadin    Cataract    Chest pain 03/2012   a. Lex MV 10/13:  EF 64%, dist ant and apical defect sugg of soft tissue atten, no ischemia   Chronic systolic heart  failure (HCC)    Depression    GERD (gastroesophageal reflux disease)    HLD (hyperlipidemia)    Mild mitral regurgitation    MS (multiple sclerosis) (HCC)    NICM (nonischemic cardiomyopathy) (HCC)    a. neg CLite in 2003;  b. EF 40-45% in past;   c.  Echo 12/11: EF 35-40%, mild MR, mild LAE, mild RAE, small pericardial effusion    Osteoarthritis    Osteoporosis    Stasis ulcer (HCC)    Vaginal prolapse without uterine prolapse    Varicose veins     PAST SURGICAL HISTORY:   Past Surgical History:  Procedure Laterality Date   ANTERIOR AND POSTERIOR VAGINAL REPAIR W/ SACROSPINOUS LIGAMENT SUSPENSION  2016   [facility]   BLADDER SURGERY     CARPAL TUNNEL RELEASE Right 08/22/2017   Procedure: CARPAL TUNNEL RELEASE;  Surgeon: Kay Kemps, MD;  Location: [facility] OR;  Service: Orthopedics;  Laterality: Right;   cataract surgery Bilateral    EYE SURGERY     INGUINAL HERNIA REPAIR     right   LIPOMA EXCISION     h/o removed from upper back x 2   REVERSE SHOULDER ARTHROPLASTY Right 08/22/2017   Procedure: REVERSE SHOULDER ARTHROPLASTY;  Surgeon: Kay Kemps, MD;  Location: [facility] OR;  Service: Orthopedics;  Laterality: Right;    SOCIAL HISTORY:   Social History   Tobacco Use   Smoking  status: Never    Passive exposure: Never   Smokeless tobacco: Never   Tobacco comments:    Verified by Daughter, [place]  Substance Use Topics   Alcohol use: No    FAMILY HISTORY:   Family History  Problem Relation Age of Onset   Diabetes Father 26       diabetes and syncope   Heart attack Father    Heart attack Sister    Heart attack Brother        all 5 brothers had MIs   Stroke Brother    Cancer Sister        bone   Diabetes Sister    Cancer Sister    Diabetes Sister    Heart attack Brother    Dementia Mother    Dementia Brother    CAD Other    Diabetes Daughter    Colon cancer Neg Hx    Neuropathy Neg Hx     DRUG ALLERGIES:   Allergies  Allergen Reactions   Bactrim   [Sulfamethoxazole -Trimethoprim ]     Mouth sores   Hydrocortisone Other (See Comments)    Unknown, no reaction listed on MAR   Lipitor [Atorvastatin  Calcium ]     myalgia   Lisinopril      cough    REVIEW OF SYSTEMS:   ROS As per history of present illness. All pertinent systems were reviewed above. Constitutional, HEENT, cardiovascular, respiratory, GI, GU, musculoskeletal, neuro, psychiatric, endocrine, integumentary and hematologic systems were reviewed and are otherwise negative/unremarkable except for positive findings mentioned above in the HPI.   MEDICATIONS AT HOME:   Prior to Admission medications   Medication Sig Start Date End Date Taking? Authorizing Provider  allopurinol  (ZYLOPRIM ) 100 MG tablet Take 100 mg by mouth daily.    [provider]  dextromethorphan  (DELSYM ) 30 MG/5ML liquid Take 5 mLs (30 mg total) by mouth 2 (two) times daily as needed for cough. 04/04/23   Johnson, Clanford L, MD  divalproex  (DEPAKOTE  ER) 250 MG 24 hr tablet Take 1 tablet (250 mg total) by mouth daily. 09/23/20   Samtani, Jai-Gurmukh, MD  docusate sodium  (COLACE) 100 MG capsule Take 100 mg by mouth daily.    [provider]  DULoxetine  (CYMBALTA ) 30 MG capsule Take 1 capsule (30 mg total) by mouth daily. 09/23/20   Samtani, Jai-Gurmukh, MD  ELIQUIS  5 MG TABS tablet TAKE 1 TABLET BY MOUTH EVERY 12 HOURS 11/16/20   Hawks, Bari A, FNP  fenofibrate  (TRICOR ) 48 MG tablet Take 48 mg by mouth daily. 09/25/21   [provider]  folic acid  (FOLVITE ) 1 MG tablet Take 1 mg by mouth daily.    [provider]  ipratropium-albuterol  (DUONEB) 0.5-2.5 (3) MG/3ML SOLN Take 3 mLs by nebulization 3 (three) times daily. 04/04/23   Vicci Afton CROME, MD  OXYGEN  Inhale into the lungs. 2 liters PRN - anytime when needed ( cardio rx'd)    [provider]  polyethylene glycol (MIRALAX  / GLYCOLAX ) 17 g packet Take 17 g by mouth daily as needed for mild constipation. 04/04/23    Johnson, Clanford L, MD      VITAL SIGNS:  Blood pressure 115/83, pulse 76, temperature (!) 97.3 F (36.3 C), temperature source Oral, resp. rate 20, height 5' 11 (1.803 m), weight 108.5 kg, SpO2 96%.  PHYSICAL EXAMINATION:  Physical Exam  GENERAL:  83 y.o.-year-old patient lying in the bed with no acute distress.  EYES: Pupils equal, round, reactive to light and accommodation. No scleral icterus.  Extraocular muscles intact.  HEENT: Head atraumatic, normocephalic. Oropharynx and nasopharynx clear.  NECK:  Supple, no jugular venous distention. No thyroid  enlargement, no tenderness.  LUNGS: Diminished bibasilar breath sounds with right basal crackles.  CARDIOVASCULAR: Regular rate and rhythm, S1, S2 normal. No murmurs, rubs, or gallops.  ABDOMEN: Soft, nondistended, nontender. Bowel sounds present. No organomegaly or mass.  EXTREMITIES: No pedal edema, cyanosis, or clubbing.  NEUROLOGIC: Cranial nerves II through XII are intact. Muscle strength 5/5 in all extremities. Sensation intact. Gait not checked.  PSYCHIATRIC: The patient is alert and oriented x 3.  Normal affect and good eye contact. SKIN: No obvious rash, lesion, or ulcer.  Musculoskeletal: 4 view knee x-ray revealed severe: Tricompartment arthritis with small knee effusion with no acute osseous abnormality. Levoscoliosis with advanced multilevel degenerative changes. No definite acute osseous abnormality.  The patient was given 0.5 mg of IV Dilaudid and 4 mg of IV Zofran .  She will be admitted to medical telemetry bed for further evaluation and management. LABORATORY PANEL:   CBC Recent Labs  Lab 12/27/23 1921  WBC 9.2  HGB 12.8  HCT 38.8  PLT 257   ------------------------------------------------------------------------------------------------------------------  Chemistries  Recent Labs  Lab 12/27/23 1921  NA 132*  K 3.7  CL 94*  CO2 29  GLUCOSE 127*  BUN 14  CREATININE 0.89  CALCIUM  8.5*  AST 18  ALT 13   ALKPHOS 85  BILITOT 0.7   ------------------------------------------------------------------------------------------------------------------  Cardiac Enzymes No results for input(s): TROPONINI in the last 168 hours. ------------------------------------------------------------------------------------------------------------------  RADIOLOGY:  DG Chest Portable 1 View Result Date: 12/27/2023 CLINICAL DATA:  hypoxia EXAM: PORTABLE CHEST 1 VIEW COMPARISON:  Chest x-ray 04/01/2023, CT C-spine 12/27/2023 FINDINGS: The heart and mediastinal contours are unchanged. Atherosclerotic plaque. Right costophrenic angle nodule like airspace opacity. No focal consolidation. Chronic coarsened interstitial markings with no overt pulmonary edema. No pleural effusion. No pneumothorax. No acute osseous abnormality.  Right shoulder arthroplasty. IMPRESSION: 1. Right costophrenic angle nodule like airspace opacity. Followup PA and lateral chest X-ray is recommended in 3-4 weeks following therapy to ensure resolution. 2. Aortic Atherosclerosis (ICD10-I70.0) and Emphysema (ICD10-J43.9). Electronically Signed   By: Morgane  Naveau M.D.   On: 12/27/2023 20:01   CT PELVIS WO CONTRAST Result Date: 12/27/2023 CLINICAL DATA:  Hip trauma, fracture suspected, xray done.  Fall EXAM: CT PELVIS WITHOUT CONTRAST TECHNIQUE: Multidetector CT imaging of the pelvis was performed following the standard protocol without intravenous contrast. RADIATION DOSE REDUCTION: This exam was performed according to the departmental dose-optimization program which includes automated exposure control, adjustment of the mA and/or kV according to patient size and/or use of iterative reconstruction technique. COMPARISON:  CT lumbar spine 12/27/2023 FINDINGS: Urinary Tract:  No abnormality visualized. Bowel: Colonic diverticulosis. Unremarkable visualized pelvic bowel loops. Vascular/Lymphatic: No pathologically enlarged lymph nodes. No significant vascular  abnormality seen. Reproductive: Calcified uterine fibroids. No mass or other significant abnormality Other: No intraperitoneal free fluid. No intraperitoneal free gas. No organized fluid collection. Musculoskeletal: No acute displaced fracture or dislocation of either hips. No acute displaced fracture or diastasis of the bones of the pelvis. Densely sclerotic lesion of the right sacral al is chronic and likely represents a bone island. No severe degenerative changes. Diffusely decreased bone density. IMPRESSION: 1. Negative for acute traumatic injury. 2. Diffusely decreased bone density. Electronically Signed   By: Morgane  Naveau M.D.   On: 12/27/2023 20:00   CT Lumbar Spine Wo Contrast Result Date: 12/27/2023 CLINICAL DATA:  Back trauma, no prior imaging (Age >=  16y) pt fall going to bathroom. Denies LOC. Pt on blood thinners. Main complaint of hip and back pain with minor shortness of breath. EMS placed pt on 3 lpm o2 [place] EXAM: CT LUMBAR SPINE WITHOUT CONTRAST TECHNIQUE: Multidetector CT imaging of the lumbar spine was performed without intravenous contrast administration. Multiplanar CT image reconstructions were also generated. RADIATION DOSE REDUCTION: This exam was performed according to the departmental dose-optimization program which includes automated exposure control, adjustment of the mA and/or kV according to patient size and/or use of iterative reconstruction technique. COMPARISON:  CT abdomen pelvis and CT lumbar spine 06/22/2021 FINDINGS: Segmentation: 5 lumbar type vertebrae. Alignment: Levoscoliosis of the thoracolumbar spine centered at the L2 level. 6 mm rightward translocation of the L4 vertebral body in relation to the L3. Vertebrae: Multilevel severe degenerative changes of the spine. No associated severe osseous neural foraminal or central canal stenosis. No acute fracture or focal pathologic process. Paraspinal and other soft tissues: Negative. Disc levels: Multilevel intervertebral disc  space vacuum phenomenon. Other: Atherosclerotic plaque.  Colonic diverticulosis. IMPRESSION: 1. No acute displaced fracture or traumatic listhesis of the lumbar spine. 2.  Aortic Atherosclerosis (ICD10-I70.0). Electronically Signed   By: Morgane  Naveau M.D.   On: 12/27/2023 19:58   DG Lumbar Spine Complete Result Date: 12/27/2023 CLINICAL DATA:  Fall with back pain EXAM: LUMBAR SPINE - COMPLETE 4+ VIEW COMPARISON:  CT 06/22/2021 FINDINGS: Levoscoliosis. Vertebral body heights are maintained. Advanced multilevel degenerative changes with diffuse disc space narrowing and osteophyte. Multilevel facet degenerative changes. Aortic atherosclerosis IMPRESSION: Levoscoliosis with advanced multilevel degenerative changes. No definite acute osseous abnormality Electronically Signed   By: Luke Bun M.D.   On: 12/27/2023 17:28   DG Knee Complete 4 Views Right Result Date: 12/27/2023 CLINICAL DATA:  Fall with knee pain EXAM: RIGHT KNEE - COMPLETE 4+ VIEW COMPARISON:  06/22/2021 FINDINGS: No definitive acute fracture or malalignment. Severe tricompartment arthritis with bone on bone contact medial and patellofemoral joint spaces. Small knee effusion. Extensive vascular calcifications. IMPRESSION: No definitive acute osseous abnormality. Severe tricompartment arthritis. Small knee effusion. Electronically Signed   By: Luke Bun M.D.   On: 12/27/2023 17:27   DG Hip Unilat With Pelvis 2-3 Views Right Result Date: 12/27/2023 CLINICAL DATA:  Fall, right hip pain EXAM: DG HIP (WITH OR WITHOUT PELVIS) 2-3V RIGHT COMPARISON:  None Available. FINDINGS: Symmetric degenerative changes in the hips with joint space narrowing and spurring. No acute bony abnormality. Specifically, no fracture, subluxation, or dislocation. IMPRESSION: No acute bony abnormality. Electronically Signed   By: Franky Crease M.D.   On: 12/27/2023 17:25   CT Cervical Spine Wo Contrast Result Date: 12/27/2023 CLINICAL DATA:  Neck trauma. Fall with head  injury. On blood thinners. EXAM: CT CERVICAL SPINE WITHOUT CONTRAST TECHNIQUE: Multidetector CT imaging of the cervical spine was performed without intravenous contrast. Multiplanar CT image reconstructions were also generated. RADIATION DOSE REDUCTION: This exam was performed according to the departmental dose-optimization program which includes automated exposure control, adjustment of the mA and/or kV according to patient size and/or use of iterative reconstruction technique. COMPARISON:  Cervical spine radiographs 08/15/2020. Cervical MRI 07/25/2016. FINDINGS: Alignment: Reversal of the usual cervical lordosis, similar to prior imaging. Minimal stepwise anterolisthesis at C3-4, C4-5 and C5-6. Skull base and vertebrae: No evidence of acute cervical spine fracture or traumatic subluxation. Soft tissues and spinal canal: No prevertebral fluid or swelling. No visible canal hematoma. Disc levels: Multilevel spondylosis with disc space narrowing, endplate osteophytes and uncinate spurring, most advanced at  C5-6 and C6-7. There is multilevel facet arthropathy. No large disc herniation or high-grade foraminal narrowing demonstrated. There is multilevel foraminal narrowing which appears worst on the right at C3-4 and C4-5 and on the left at C5-6. Upper chest: Emphysematous changes at the lung apices. Other: Bilateral carotid atherosclerosis. Previous right shoulder reverse arthroplasty. IMPRESSION: 1. No evidence of acute cervical spine fracture, traumatic subluxation or static signs of instability. 2. Multilevel cervical spondylosis as described. Electronically Signed   By: Elsie Perone M.D.   On: 12/27/2023 17:18   CT Head Wo Contrast Result Date: 12/27/2023 CLINICAL DATA:  Head trauma, minor (Age >= 65y).  Fall. EXAM: CT HEAD WITHOUT CONTRAST TECHNIQUE: Contiguous axial images were obtained from the base of the skull through the vertex without intravenous contrast. RADIATION DOSE REDUCTION: This exam was  performed according to the departmental dose-optimization program which includes automated exposure control, adjustment of the mA and/or kV according to patient size and/or use of iterative reconstruction technique. COMPARISON:  09/15/2020 FINDINGS: Brain: There is atrophy and chronic small vessel disease changes. Old left thalamic lacunar infarct. No acute intracranial abnormality. Specifically, no hemorrhage, hydrocephalus, mass lesion, acute infarction, or significant intracranial injury. Vascular: No hyperdense vessel or unexpected calcification. Skull: No acute calvarial abnormality. Sinuses/Orbits: No acute findings Other: None IMPRESSION: Atrophy, chronic microvascular disease. No acute intracranial abnormality. Old left thalamic lacunar infarct. Electronically Signed   By: Franky Crease M.D.   On: 12/27/2023 17:16      IMPRESSION AND PLAN:  Assessment and Plan: * CAP (community acquired pneumonia) - The patient will be admitted to a medical telemetry bed. - Will continue antibiotic therapy with IV Rocephin  and Zithromax . - Mucolytic therapy be provided as well as duo nebs q.i.d. and q.4 hours p.r.n. - We will follow blood cultures.   Acute respiratory failure with hypoxia (HCC) - This is clearly secondary to #1. - O2 protocol will be followed.  Dyslipidemia - Will continue statin therapy as well as Tricor  and serum bicarbonate.  Gout - We will continue allopurinol .  Type 2 diabetes mellitus without complications (HCC) - The patient will be placed on supplemental coverage with NovoLog . - Will hold off metformin.  Paroxysmal atrial fibrillation (HCC) - Will continue p.o. Lopressor  as well as Eliquis     DVT prophylaxis: Eliquis . Advanced Care Planning:  Code Status: full code.  Family Communication:  The plan of care was discussed in details with the patient (and family). I answered all questions. The patient agreed to proceed with the above mentioned plan. Further management  will depend upon hospital course. Disposition Plan: Back to previous home environment Consults called: none.  All the records are reviewed and case discussed with ED provider.  Status is: Inpatient  At the time of the admission, it appears that the appropriate admission status for this patient is inpatient.  This is judged to be reasonable and necessary in order to provide the required intensity of service to ensure the patient's safety given the presenting symptoms, physical exam findings and initial radiographic and laboratory data in the context of comorbid conditions.  The patient requires inpatient status due to high intensity of service, high risk of further deterioration and high frequency of surveillance required.  I certify that at the time of admission, it is my clinical judgment that the patient will require inpatient hospital care extending more than 2 midnights.  Dispo: The patient is from: Home              Anticipated d/c is to: Home              Patient currently is not medically stable to d/c.              Difficult to place patient: No  [place] DELENA Peaches M.D on 12/28/2023 at 4:10 AM  [facility]   From 7 PM-7 AM, contact night-coverage [email or web address]  CC: Primary care physician; Pia Kerney SQUIBB, MD

## 2023-12-28 NOTE — Progress Notes (Signed)
   12/28/23 1937  TOC Brief Assessment  Insurance and Status Reviewed  Patient has primary care physician Yes  Home environment has been reviewed ALF  Prior level of function: Assisted  Prior/Current Home Services No current home services  Social Drivers of Health Review SDOH reviewed no interventions necessary  Readmission risk has been reviewed Yes  Transition of care needs no transition of care needs at this time   Pt from [facility]. Pt has previously used [employer] home oxygen  but did not need it long-term. TOC to follow.

## 2023-12-28 NOTE — Plan of Care (Signed)

## 2023-12-28 NOTE — Assessment & Plan Note (Addendum)
-   Will continue statin therapy as well as Tricor  and serum bicarbonate.

## 2023-12-28 NOTE — Assessment & Plan Note (Signed)
-   This is clearly secondary to #1. - O2 protocol will be followed.  

## 2023-12-28 NOTE — Plan of Care (Signed)
   Problem: Health Behavior/Discharge Planning: Goal: Ability to manage health-related needs will improve Outcome: Progressing

## 2023-12-28 NOTE — TOC Initial Note (Signed)
 Transition of Care [facility]) - Initial/Assessment Note    Patient Details  Name: Virginia Young MRN: [id number] Date of Birth: [birth year]  Transition of Care [facility]) CM/SW Contact:    Nena LITTIE Coffee, RN Phone Number: 12/28/2023, 7:42 PM  Clinical Narrative:                 Pt from [facility]. Pt has previously used [employer] home oxygen  but did not need it long-term. TOC to follow.   Expected Discharge Plan: Assisted Living Barriers to Discharge: Continued Medical Work up   Patient Goals and CMS Choice Patient states their goals for this hospitalization and ongoing recovery are:: Return home to [facility]          Expected Discharge Plan and Services In-house Referral: Clinical Social Work Discharge Planning Services: CM Consult   Living arrangements for the past 2 months: Assisted Living Facility                                      Prior Living Arrangements/Services Living arrangements for the past 2 months: Assisted Living Facility Lives with:: Facility Resident Patient language and need for interpreter reviewed:: Yes Do you feel safe going back to the place where you live?: Yes      Need for Family Participation in Patient Care: Yes (Comment) Care giver support system in place?: Yes (comment) Current home services: DME (walker) Criminal Activity/Legal Involvement Pertinent to Current Situation/Hospitalization: No - Comment as needed  Activities of Daily Living   ADL Screening (condition at time of admission) Independently performs ADLs?: Yes (appropriate for developmental age) Is the patient deaf or have difficulty hearing?: No Does the patient have difficulty seeing, even when wearing glasses/contacts?: No Does the patient have difficulty concentrating, remembering, or making decisions?: No  Permission Sought/Granted                  Emotional Assessment Appearance:: Appears stated age Attitude/Demeanor/Rapport: Engaged Affect  (typically observed): Appropriate Orientation: : Oriented to Self, Oriented to Place, Oriented to Situation Alcohol / Substance Use: Not Applicable Psych Involvement: No (comment)  Admission diagnosis:  CAP (community acquired pneumonia) [J18.9] Contusion of right hip, initial encounter [S70.01XA] Closed head injury, initial encounter [S09.90XA] Pneumonia of right lower lobe due to infectious organism [J18.9] Acute on chronic hypoxic respiratory failure (HCC) [J96.21] Patient Active Problem List   Diagnosis Date Noted   Acute respiratory failure with hypoxia (HCC) 12/28/2023   Dyslipidemia 12/28/2023   Paroxysmal atrial fibrillation (HCC) 12/28/2023   Type 2 diabetes mellitus without complications (HCC) 12/28/2023   Gout 12/28/2023   CAP (community acquired pneumonia) 12/27/2023   Pneumonia 04/01/2023   Hyponatremia 04/01/2023   Elevated serum creatinine 04/01/2023   Syncope 09/16/2020   Hypokalemia 09/16/2020   Hypoalbuminemia 09/16/2020   Abrasion of right arm 09/16/2020   Right sided weakness 09/16/2020   Accidental fall 09/16/2020   Syncope and collapse 09/16/2020   Acute CVA (cerebrovascular accident) (HCC) 09/15/2020   Pyometra due to chronic inflammatory disease of uterus 02/18/2020   Postmenopausal 08/11/2019   Vitamin D  deficiency 02/17/2019   Constipation 02/17/2019   Witnessed seizure-like activity (HCC) 02/03/2019   Chronic systolic HF (heart failure) (HCC) 10/08/2018   Educated about COVID-19 virus infection 10/08/2018   Dementia (HCC) 02/10/2018   S/P shoulder replacement, right 08/22/2017   Essential hypertension 02/19/2016   Voiding dysfunction 10/07/2014   SUI (  stress urinary incontinence, female) 10/07/2014   Pathological fracture due to osteoporosis 04/06/2014   DOE (dyspnea on exertion) 07/23/2013   Helicobacter positive gastritis 06/21/2013   Orthostatic hypotension 06/10/2013   Recurrent UTI 05/06/2013   Neck mass 02/19/2013   Right groin mass  11/08/2011   Low back pain 11/08/2011   Lipoma of neck 06/21/2011   OVERACTIVE BLADDER 08/21/2010   Long term current use of anticoagulant 07/25/2010   Osteoarthritis 01/18/2010   KNEE PAIN 01/18/2010   FOOT PAIN 01/18/2010   Mixed hyperlipidemia 04/20/2009   GERD 04/17/2009   MITRAL REGURGITATION, MILD 08/30/2008   NICM (nonischemic cardiomyopathy) (HCC) 08/30/2008   Depression, major, single episode, moderate (HCC) 04/10/2007   DISEASE, MITRAL VALVE NEC/NOS 04/10/2007   Atrial fibrillation (HCC) 04/10/2007   STASIS ULCER 04/10/2007   VENOUS INSUFFICIENCY, LEGS 04/10/2007   PCP:  Pia Kerney SQUIBB, MD Pharmacy:   [facility] [phone] - [place], [place] - [address] [place] [address] Phone: [phone] Fax: [phone]  [facility] [place], [place] - [place] [address] [place] [address] Phone: [phone] Fax: [phone]     Social Drivers of Health (SDOH) Social History: SDOH Screenings   Food Insecurity: No Food Insecurity (12/27/2023)  Housing: Low Risk  (12/27/2023)  Transportation Needs: No Transportation Needs (12/27/2023)  Utilities: Not At Risk (12/27/2023)  Alcohol Screen: Low Risk  (03/13/2022)  Depression (PHQ2-9): Low Risk  (03/13/2022)  Financial Resource Strain: Low Risk  (03/13/2022)  Physical Activity: Inactive (03/13/2022)  Social Connections: Moderately Isolated (12/27/2023)  Stress: No Stress Concern Present (03/13/2022)  Tobacco Use: Low Risk  (04/03/2023)   SDOH Interventions:     Readmission Risk Interventions    12/28/2023    7:37 PM  Readmission Risk Prevention Plan  Post Dischage Appt Complete  Medication Screening Complete  Transportation Screening Complete

## 2023-12-28 NOTE — Progress Notes (Signed)
 Daughter Virginia Young called and stated she was patients decision maker, as patient has dementia.  Phone number is in chart.

## 2023-12-28 NOTE — Progress Notes (Signed)
 PROGRESS NOTE    Virginia Young  FMW:[id number] DOB: 01/07/1941 DOA: 12/27/2023 PCP: Pia Kerney SQUIBB, MD   Brief Narrative:    Virginia Young is a 83 y.o. female with medical history significant for atrial fibrillation on Eliquis , chronic systolic CHF, type 2 diabetes mellitus, GERD, depression, anemia and MS as well as an ICM, osteoporosis and osteoarthritis, presented to the emergency room with acute onset of fall with subsequent occipital head injury without presyncope or syncope.  She has been having cough productive of yellow sputum as well as dyspnea and wheezing over the last few days.  She has been admitted for treatment of community-acquired pneumonia.  Assessment & Plan:   Principal Problem:   CAP (community acquired pneumonia) Active Problems:   Acute respiratory failure with hypoxia (HCC)   Dyslipidemia   Paroxysmal atrial fibrillation (HCC)   Type 2 diabetes mellitus without complications (HCC)   Gout  Assessment and Plan:   Acute hypoxemic respiratory failure secondary to CAP (community acquired pneumonia) - The patient will be admitted to a medical telemetry bed. - Will continue antibiotic therapy with IV Rocephin  and Zithromax . - Mucolytic therapy be provided as well as duo nebs q.i.d. and q.4 hours p.r.n.  Mild hypokalemia -Replete and reevaluate   Dyslipidemia - Will continue statin therapy as well as Tricor  and serum bicarbonate.   Gout - We will continue allopurinol .   Type 2 diabetes mellitus without complications (HCC) - The patient will be placed on supplemental coverage with NovoLog . - Will hold off metformin.   Paroxysmal atrial fibrillation (HCC) - Will continue p.o. Lopressor  as well as Eliquis   Obesity, class I - BMI 33.36  DVT prophylaxis:apixaban  Code Status: Full Family Communication: None at bedside Disposition Plan:  Status is: Inpatient Remains inpatient appropriate because: Need for IV antibiotics.   Consultants:   None  Procedures:  None  Antimicrobials:  Anti-infectives (From admission, onward)    Start     Dose/Rate Route Frequency Ordered Stop   12/27/23 2215  cefTRIAXone  (ROCEPHIN ) 2 g in sodium chloride  0.9 % 100 mL IVPB        2 g 200 mL/hr over 30 Minutes Intravenous Every 24 hours 12/27/23 2206 01/01/24 2214   12/27/23 2215  azithromycin  (ZITHROMAX ) 500 mg in sodium chloride  0.9 % 250 mL IVPB        500 mg 250 mL/hr over 60 Minutes Intravenous Every 24 hours 12/27/23 2206 01/01/24 2214   12/27/23 2145  cefTRIAXone  (ROCEPHIN ) 1 g in sodium chloride  0.9 % 100 mL IVPB  Status:  Discontinued        1 g 200 mL/hr over 30 Minutes Intravenous  Once 12/27/23 2133 12/27/23 2211   12/27/23 2145  azithromycin  (ZITHROMAX ) tablet 500 mg  Status:  Discontinued        500 mg Oral  Once 12/27/23 2133 12/27/23 2211       Subjective: Patient seen and evaluated today with no new acute complaints or concerns. No acute concerns or events noted overnight.  She seems to be coughing less and continues to remain on nasal cannula oxygen .  Objective: Vitals:   12/28/23 0454 12/28/23 0555 12/28/23 0945 12/28/23 1057  BP: (!) 170/73 (!) 156/88  (!) 143/89  Pulse: 71 74  83  Resp: 16 17  18   Temp: 97.8 F (36.6 C) 97.9 F (36.6 C)  98.2 F (36.8 C)  TempSrc:    Oral  SpO2: 94%  100% 100%  Weight:  Height:        Intake/Output Summary (Last 24 hours) at 12/28/2023 1114 Last data filed at 12/28/2023 0900 Gross per 24 hour  Intake 954.67 ml  Output --  Net 954.67 ml   Filed Weights   12/27/23 1604 12/27/23 2300  Weight: 106.8 kg 108.5 kg    Examination:  General exam: Appears calm and comfortable  Respiratory system: Clear to auscultation. Respiratory effort normal.  2.5 L nasal cannula Cardiovascular system: S1 & S2 heard, RRR.  Gastrointestinal system: Abdomen is soft Central nervous system: Alert and awake Extremities: No edema Skin: No significant lesions noted Psychiatry: Flat  affect.    Data Reviewed: I have personally reviewed following labs and imaging studies  CBC: Recent Labs  Lab 12/27/23 1921 12/28/23 0410  WBC 9.2 8.4  NEUTROABS 5.8  --   HGB 12.8 12.7  HCT 38.8 39.9  MCV 87.6 87.5  PLT 257 257   Basic Metabolic Panel: Recent Labs  Lab 12/27/23 1921 12/28/23 0410  NA 132* 134*  K 3.7 3.4*  CL 94* 96*  CO2 29 29  GLUCOSE 127* 104*  BUN 14 12  CREATININE 0.89 0.81  CALCIUM  8.5* 8.3*   GFR: Estimated Creatinine Clearance: 71.4 mL/min (by C-G formula based on SCr of 0.81 mg/dL). Liver Function Tests: Recent Labs  Lab 12/27/23 1921  AST 18  ALT 13  ALKPHOS 85  BILITOT 0.7  PROT 6.3*  ALBUMIN 3.0*   No results for input(s): LIPASE, AMYLASE in the last 168 hours. No results for input(s): AMMONIA in the last 168 hours. Coagulation Profile: No results for input(s): INR, PROTIME in the last 168 hours. Cardiac Enzymes: No results for input(s): CKTOTAL, CKMB, CKMBINDEX, TROPONINI in the last 168 hours. BNP (last 3 results) No results for input(s): PROBNP in the last 8760 hours. HbA1C: No results for input(s): HGBA1C in the last 72 hours. CBG: Recent Labs  Lab 12/28/23 0711 12/28/23 1100  GLUCAP 107* 181*   Lipid Profile: No results for input(s): CHOL, HDL, LDLCALC, TRIG, CHOLHDL, LDLDIRECT in the last 72 hours. Thyroid  Function Tests: No results for input(s): TSH, T4TOTAL, FREET4, T3FREE, THYROIDAB in the last 72 hours. Anemia Panel: No results for input(s): VITAMINB12, FOLATE, FERRITIN, TIBC, IRON, RETICCTPCT in the last 72 hours. Sepsis Labs: No results for input(s): PROCALCITON, LATICACIDVEN in the last 168 hours.  No results found for this or any previous visit (from the past 240 hours).       Radiology Studies: DG Chest Portable 1 View Result Date: 12/27/2023 CLINICAL DATA:  hypoxia EXAM: PORTABLE CHEST 1 VIEW COMPARISON:  Chest x-ray 04/01/2023, CT  C-spine 12/27/2023 FINDINGS: The heart and mediastinal contours are unchanged. Atherosclerotic plaque. Right costophrenic angle nodule like airspace opacity. No focal consolidation. Chronic coarsened interstitial markings with no overt pulmonary edema. No pleural effusion. No pneumothorax. No acute osseous abnormality.  Right shoulder arthroplasty. IMPRESSION: 1. Right costophrenic angle nodule like airspace opacity. Followup PA and lateral chest X-ray is recommended in 3-4 weeks following therapy to ensure resolution. 2. Aortic Atherosclerosis (ICD10-I70.0) and Emphysema (ICD10-J43.9). Electronically Signed   By: Morgane  Naveau M.D.   On: 12/27/2023 20:01   CT PELVIS WO CONTRAST Result Date: 12/27/2023 CLINICAL DATA:  Hip trauma, fracture suspected, xray done.  Fall EXAM: CT PELVIS WITHOUT CONTRAST TECHNIQUE: Multidetector CT imaging of the pelvis was performed following the standard protocol without intravenous contrast. RADIATION DOSE REDUCTION: This exam was performed according to the departmental dose-optimization program which includes automated exposure control, adjustment  of the mA and/or kV according to patient size and/or use of iterative reconstruction technique. COMPARISON:  CT lumbar spine 12/27/2023 FINDINGS: Urinary Tract:  No abnormality visualized. Bowel: Colonic diverticulosis. Unremarkable visualized pelvic bowel loops. Vascular/Lymphatic: No pathologically enlarged lymph nodes. No significant vascular abnormality seen. Reproductive: Calcified uterine fibroids. No mass or other significant abnormality Other: No intraperitoneal free fluid. No intraperitoneal free gas. No organized fluid collection. Musculoskeletal: No acute displaced fracture or dislocation of either hips. No acute displaced fracture or diastasis of the bones of the pelvis. Densely sclerotic lesion of the right sacral al is chronic and likely represents a bone island. No severe degenerative changes. Diffusely decreased bone  density. IMPRESSION: 1. Negative for acute traumatic injury. 2. Diffusely decreased bone density. Electronically Signed   By: Morgane  Naveau M.D.   On: 12/27/2023 20:00   CT Lumbar Spine Wo Contrast Result Date: 12/27/2023 CLINICAL DATA:  Back trauma, no prior imaging (Age >= 16y) pt fall going to bathroom. Denies LOC. Pt on blood thinners. Main complaint of hip and back pain with minor shortness of breath. EMS placed pt on 3 lpm o2 [place] EXAM: CT LUMBAR SPINE WITHOUT CONTRAST TECHNIQUE: Multidetector CT imaging of the lumbar spine was performed without intravenous contrast administration. Multiplanar CT image reconstructions were also generated. RADIATION DOSE REDUCTION: This exam was performed according to the departmental dose-optimization program which includes automated exposure control, adjustment of the mA and/or kV according to patient size and/or use of iterative reconstruction technique. COMPARISON:  CT abdomen pelvis and CT lumbar spine 06/22/2021 FINDINGS: Segmentation: 5 lumbar type vertebrae. Alignment: Levoscoliosis of the thoracolumbar spine centered at the L2 level. 6 mm rightward translocation of the L4 vertebral body in relation to the L3. Vertebrae: Multilevel severe degenerative changes of the spine. No associated severe osseous neural foraminal or central canal stenosis. No acute fracture or focal pathologic process. Paraspinal and other soft tissues: Negative. Disc levels: Multilevel intervertebral disc space vacuum phenomenon. Other: Atherosclerotic plaque.  Colonic diverticulosis. IMPRESSION: 1. No acute displaced fracture or traumatic listhesis of the lumbar spine. 2.  Aortic Atherosclerosis (ICD10-I70.0). Electronically Signed   By: Morgane  Naveau M.D.   On: 12/27/2023 19:58   DG Lumbar Spine Complete Result Date: 12/27/2023 CLINICAL DATA:  Fall with back pain EXAM: LUMBAR SPINE - COMPLETE 4+ VIEW COMPARISON:  CT 06/22/2021 FINDINGS: Levoscoliosis. Vertebral body heights are maintained.  Advanced multilevel degenerative changes with diffuse disc space narrowing and osteophyte. Multilevel facet degenerative changes. Aortic atherosclerosis IMPRESSION: Levoscoliosis with advanced multilevel degenerative changes. No definite acute osseous abnormality Electronically Signed   By: Luke Bun M.D.   On: 12/27/2023 17:28   DG Knee Complete 4 Views Right Result Date: 12/27/2023 CLINICAL DATA:  Fall with knee pain EXAM: RIGHT KNEE - COMPLETE 4+ VIEW COMPARISON:  06/22/2021 FINDINGS: No definitive acute fracture or malalignment. Severe tricompartment arthritis with bone on bone contact medial and patellofemoral joint spaces. Small knee effusion. Extensive vascular calcifications. IMPRESSION: No definitive acute osseous abnormality. Severe tricompartment arthritis. Small knee effusion. Electronically Signed   By: Luke Bun M.D.   On: 12/27/2023 17:27   DG Hip Unilat With Pelvis 2-3 Views Right Result Date: 12/27/2023 CLINICAL DATA:  Fall, right hip pain EXAM: DG HIP (WITH OR WITHOUT PELVIS) 2-3V RIGHT COMPARISON:  None Available. FINDINGS: Symmetric degenerative changes in the hips with joint space narrowing and spurring. No acute bony abnormality. Specifically, no fracture, subluxation, or dislocation. IMPRESSION: No acute bony abnormality. Electronically Signed   By: Franky  Dover M.D.   On: 12/27/2023 17:25   CT Cervical Spine Wo Contrast Result Date: 12/27/2023 CLINICAL DATA:  Neck trauma. Fall with head injury. On blood thinners. EXAM: CT CERVICAL SPINE WITHOUT CONTRAST TECHNIQUE: Multidetector CT imaging of the cervical spine was performed without intravenous contrast. Multiplanar CT image reconstructions were also generated. RADIATION DOSE REDUCTION: This exam was performed according to the departmental dose-optimization program which includes automated exposure control, adjustment of the mA and/or kV according to patient size and/or use of iterative reconstruction technique. COMPARISON:   Cervical spine radiographs 08/15/2020. Cervical MRI 07/25/2016. FINDINGS: Alignment: Reversal of the usual cervical lordosis, similar to prior imaging. Minimal stepwise anterolisthesis at C3-4, C4-5 and C5-6. Skull base and vertebrae: No evidence of acute cervical spine fracture or traumatic subluxation. Soft tissues and spinal canal: No prevertebral fluid or swelling. No visible canal hematoma. Disc levels: Multilevel spondylosis with disc space narrowing, endplate osteophytes and uncinate spurring, most advanced at C5-6 and C6-7. There is multilevel facet arthropathy. No large disc herniation or high-grade foraminal narrowing demonstrated. There is multilevel foraminal narrowing which appears worst on the right at C3-4 and C4-5 and on the left at C5-6. Upper chest: Emphysematous changes at the lung apices. Other: Bilateral carotid atherosclerosis. Previous right shoulder reverse arthroplasty. IMPRESSION: 1. No evidence of acute cervical spine fracture, traumatic subluxation or static signs of instability. 2. Multilevel cervical spondylosis as described. Electronically Signed   By: Elsie Perone M.D.   On: 12/27/2023 17:18   CT Head Wo Contrast Result Date: 12/27/2023 CLINICAL DATA:  Head trauma, minor (Age >= 65y).  Fall. EXAM: CT HEAD WITHOUT CONTRAST TECHNIQUE: Contiguous axial images were obtained from the base of the skull through the vertex without intravenous contrast. RADIATION DOSE REDUCTION: This exam was performed according to the departmental dose-optimization program which includes automated exposure control, adjustment of the mA and/or kV according to patient size and/or use of iterative reconstruction technique. COMPARISON:  09/15/2020 FINDINGS: Brain: There is atrophy and chronic small vessel disease changes. Old left thalamic lacunar infarct. No acute intracranial abnormality. Specifically, no hemorrhage, hydrocephalus, mass lesion, acute infarction, or significant intracranial injury.  Vascular: No hyperdense vessel or unexpected calcification. Skull: No acute calvarial abnormality. Sinuses/Orbits: No acute findings Other: None IMPRESSION: Atrophy, chronic microvascular disease. No acute intracranial abnormality. Old left thalamic lacunar infarct. Electronically Signed   By: Franky Crease M.D.   On: 12/27/2023 17:16        Scheduled Meds:  allopurinol   100 mg Oral Daily   apixaban   5 mg Oral Q12H   dextromethorphan -guaiFENesin   1 tablet Oral BID   divalproex   250 mg Oral Daily   docusate sodium   100 mg Oral Daily   DULoxetine   60 mg Oral Daily   fenofibrate   54 mg Oral Daily   folic acid   1 mg Oral Daily   insulin  aspart  0-15 Units Subcutaneous TID WC   insulin  aspart  0-5 Units Subcutaneous QHS   lidocaine   1 patch Transdermal Q24H   potassium chloride   40 mEq Oral BID   Continuous Infusions:  azithromycin  500 mg (12/27/23 2355)   cefTRIAXone  (ROCEPHIN )  IV 2 g (12/27/23 2316)     LOS: 1 day    Time spent: 55 minutes    Shakeyla Giebler D Maree, DO [facility]  If 7PM-7AM, please contact night-coverage [email or web address] 12/28/2023, 11:14 AM

## 2023-12-28 NOTE — Assessment & Plan Note (Signed)
-   Will continue p.o. Lopressor  as well as Eliquis

## 2023-12-28 NOTE — Assessment & Plan Note (Signed)
-   The patient will be placed on supplemental coverage with NovoLog. - Will hold off metformin.

## 2023-12-28 NOTE — Assessment & Plan Note (Signed)
-   The patient will be admitted to a medical telemetry bed. - Will continue antibiotic therapy with IV Rocephin and Zithromax. - Mucolytic therapy be provided as well as duo nebs q.i.d. and q.4 hours p.r.n. - We will follow blood cultures.

## 2023-12-28 NOTE — Progress Notes (Signed)
 Pt has been on room air since early this am and has tolerated well. Pt was up in recliner most of the day and was able to call appropriately for bathroom use. Occasional congested cough but non-productive. Pt has had no complaints of pain this shift.

## 2023-12-28 NOTE — Progress Notes (Signed)
 Pt assisted up to chair using FWW and standby assist x1. Pt is strong but unsteady on her feet. Tolerated movement well, denies any c/o pain. VSS.   Pt's SaO2 at 0950 was 100% on 2 lpm [place]. O2 reduced to 1lpm [place] and SaO2 recheck one hour later is 100%. O2 removed and explained to pt goal to wean off oxygen  today and will recheck SaO2 in 30 minutes. Advised pt to call if she begins to feel SOB or any difficulty breathing, pt states understanding.  Pt remains in recliner with chair alarm on for safety. Call bell and phone within reach.

## 2023-12-28 NOTE — Assessment & Plan Note (Signed)
 -  We will continue allopurinol

## 2023-12-28 NOTE — Progress Notes (Signed)
 Pt assisted up to bathroom for BM. Pt back to chair and c/o increased SOB. SaO2 94% on room air, breath sounds diminished bilaterally, resp rate 21/min. Duoneb administered.

## 2023-12-29 DIAGNOSIS — J189 Pneumonia, unspecified organism: Secondary | ICD-10-CM | POA: Diagnosis not present

## 2023-12-29 LAB — RESPIRATORY PANEL BY PCR

## 2023-12-29 LAB — BASIC METABOLIC PANEL WITH GFR
Anion gap: 10 (ref 5–15)
BUN: 8 mg/dL (ref 8–23)
CO2: 26 mmol/L (ref 22–32)
Calcium: 8.8 mg/dL — ABNORMAL LOW (ref 8.9–10.3)
Chloride: 98 mmol/L (ref 98–111)
Creatinine, Ser: 0.71 mg/dL (ref 0.44–1.00)
GFR, Estimated: >60 mL/min (ref >60)
Glucose, Bld: 106 mg/dL — ABNORMAL HIGH (ref 70–99)
Potassium: 4.2 mmol/L (ref 3.5–5.1)
Sodium: 134 mmol/L — ABNORMAL LOW (ref 135–145)

## 2023-12-29 LAB — CBC
HCT: 40.7 % (ref 36.0–46.0)
Hemoglobin: 13.4 g/dL (ref 12.0–15.0)
MCH: 28.5 pg (ref 26.0–34.0)
MCHC: 32.9 g/dL (ref 30.0–36.0)
MCV: 86.4 fL (ref 80.0–100.0)
Platelets: 279 K/uL (ref 150–400)
RBC: 4.71 MIL/uL (ref 3.87–5.11)
RDW: 14.1 % (ref 11.5–15.5)
WBC: 9 K/uL (ref 4.0–10.5)
nRBC: 0 % (ref 0.0–0.2)

## 2023-12-29 LAB — MAGNESIUM: Magnesium: 1.9 mg/dL (ref 1.7–2.4)

## 2023-12-29 LAB — GLUCOSE, CAPILLARY
Glucose-Capillary: 117 mg/dL — ABNORMAL HIGH (ref 70–99)
Glucose-Capillary: 246 mg/dL — ABNORMAL HIGH (ref 70–99)

## 2023-12-29 MED ORDER — CEFDINIR 300 MG PO CAPS: 300.0000 mg | ORAL_CAPSULE | Freq: Two times a day (BID) | ORAL | 0 refills | Status: AC

## 2023-12-29 MED ORDER — AZITHROMYCIN 500 MG PO TABS
500.0000 mg | ORAL_TABLET | Freq: Every day | ORAL | 0 refills | Status: AC
Start: 2023-12-29 — End: 2024-01-01

## 2023-12-29 NOTE — Plan of Care (Signed)
   Problem: Health Behavior/Discharge Planning: Goal: Ability to manage health-related needs will improve Outcome: Progressing

## 2023-12-29 NOTE — Evaluation (Signed)
 Physical Therapy Evaluation Patient Details Name: Virginia Young MRN: [id number] DOB: 1940/09/29 Today's Date: 12/29/2023  History of Present Illness  Virginia Young is a 83 y.o. female with medical history significant for atrial fibrillation on Coumadin , chronic systolic CHF, type 2 diabetes mellitus, GERD, depression, anemia and MS as well as an ICM, osteoporosis and osteoarthritis, presented to the emergency room with acute onset of fall with subsequent occipital head injury without presyncope or syncope.  She has been having cough productive of yellow sputum as well as dyspnea and wheezing over the last few days.  No fever or chills.  No nausea or vomiting or abdominal pain.  No dysuria, oliguria or hematuria or flank pain.  She does not wear oxygen  at home per her report.   Clinical Impression  Patient demonstrates fair/good return for getting into/out of bed with slightly labored movement, increased time for completing transfers, able to ambulate in room with slow labored movement without loss of balance using RW and limited mostly due to fatigue/weakness. Patient tolerated staying up in chair after therapy. PLAN:  Patient to be discharged home today and discharged from acute physical therapy to care of nursing for ambulation as tolerated for length of stay with recommendations stated below        If plan is discharge home, recommend the following: Help with stairs or ramp for entrance;Assistance with cooking/housework;A lot of help with walking and/or transfers;A lot of help with bathing/dressing/bathroom   Can travel by private vehicle        Equipment Recommendations None recommended by PT  Recommendations for Other Services       Functional Status Assessment Patient has had a recent decline in their functional status and demonstrates the ability to make significant improvements in function in a reasonable and predictable amount of time.     Precautions / Restrictions  Precautions Precautions: Fall Recall of Precautions/Restrictions: Intact Restrictions Weight Bearing Restrictions Per Provider Order: No      Mobility  Bed Mobility Overal bed mobility: Needs Assistance Bed Mobility: Supine to Sit, Sit to Supine     Supine to sit: Supervision Sit to supine: Supervision   General bed mobility comments: increased time,  labored movement    Transfers Overall transfer level: Needs assistance Equipment used: Rolling walker (2 wheels) Transfers: Sit to/from Stand, Bed to chair/wheelchair/BSC Sit to Stand: Supervision, Contact guard assist   Step pivot transfers: Supervision, Contact guard assist       General transfer comment: unsteady labored movement    Ambulation/Gait Ambulation/Gait assistance: Min assist Gait Distance (Feet): 20 Feet Assistive device: Rolling walker (2 wheels) Gait Pattern/deviations: Decreased step length - left, Decreased stance time - right, Decreased stride length Gait velocity: decreased     General Gait Details: slow labored movement without loss of balance, limited mostly due to c/o fatigue, generalized weakness  Stairs            Wheelchair Mobility     Tilt Bed    Modified Rankin (Stroke Patients Only)       Balance Overall balance assessment: Needs assistance Sitting-balance support: Feet supported, No upper extremity supported Sitting balance-Leahy Scale: Good Sitting balance - Comments: seated at EOB   Standing balance support: Reliant on assistive device for balance, During functional activity, Bilateral upper extremity supported Standing balance-Leahy Scale: Fair Standing balance comment: using RW  Pertinent Vitals/Pain Pain Assessment Pain Assessment: No/denies pain    Home Living Family/patient expects to be discharged to:: Assisted living                 Home Equipment: Rollator (4 wheels);BSC/3in1;Tub bench;Wheelchair -  power;Wheelchair - manual      Prior Function Prior Level of Function : Needs assist       Physical Assist : Mobility (physical) Mobility (physical): Bed mobility;Transfers;Gait;Stairs   Mobility Comments: Household ambulation using RW ADLs Comments: Assisted by ALF staff     Extremity/Trunk Assessment   Upper Extremity Assessment Upper Extremity Assessment: Overall WFL for tasks assessed    Lower Extremity Assessment Lower Extremity Assessment: Generalized weakness    Cervical / Trunk Assessment Cervical / Trunk Assessment: Normal  Communication   Communication Communication: No apparent difficulties    Cognition Arousal: Alert Behavior During Therapy: WFL for tasks assessed/performed, Anxious   PT - Cognitive impairments: No apparent impairments                         Following commands: Intact       Cueing Cueing Techniques: Verbal cues, Tactile cues     General Comments      Exercises     Assessment/Plan    PT Assessment All further PT needs can be met in the next venue of care  PT Problem List Decreased strength;Decreased activity tolerance;Decreased balance;Decreased mobility       PT Treatment Interventions      PT Goals (Current goals can be found in the Care Plan section)  Acute Rehab PT Goals Patient Stated Goal: return home with ALF staff to assist PT Goal Formulation: With patient Time For Goal Achievement: 12/29/23 Potential to Achieve Goals: Good    Frequency       Co-evaluation               AM-PAC PT 6 Clicks Mobility  Outcome Measure Help needed turning from your back to your side while in a flat bed without using bedrails?: None Help needed moving from lying on your back to sitting on the side of a flat bed without using bedrails?: A Little Help needed moving to and from a bed to a chair (including a wheelchair)?: A Little Help needed standing up from a chair using your arms (e.g., wheelchair or bedside  chair)?: A Little Help needed to walk in hospital room?: A Lot Help needed climbing 3-5 steps with a railing? : A Lot 6 Click Score: 17    End of Session   Activity Tolerance: Patient tolerated treatment well;Patient limited by fatigue Patient left: in chair;with call bell/phone within reach   PT Visit Diagnosis: Unsteadiness on feet (R26.81);Other abnormalities of gait and mobility (R26.89);Muscle weakness (generalized) (M62.81)    Time: 8980-8960 PT Time Calculation (min) (ACUTE ONLY): 20 min   Charges:   PT Evaluation $PT Eval Moderate Complexity: 1 Mod PT Treatments $Therapeutic Activity: 8-22 mins PT General Charges $$ ACUTE PT VISIT: 1 Visit         12:17 PM, 12/29/23 Lynwood Music, MPT Physical Therapist with [facility] 336 [phone] office [phone] mobile phone

## 2023-12-29 NOTE — Progress Notes (Signed)
   12/29/23 0842  Vitals  BP (!) 141/95  MAP (mmHg) 110  BP Location Left Arm  BP Method Automatic  Patient Position (if appropriate) Sitting  Pulse Rate (!) 111  Pulse Rate Source Dinamap  Resp (!) 24  Level of Consciousness  Level of Consciousness Alert  MEWS COLOR  MEWS Score Color Yellow  Oxygen  Therapy  SpO2 90 %  O2 Device Room Air  Pain Assessment  Pain Scale 0-10  Pain Score 0  MEWS Score  MEWS Temp 0  MEWS Systolic 0  MEWS Pulse 2  MEWS RR 1  MEWS LOC 0  MEWS Score 3   Pt called out c/o SOB. Arrived to room to find pt up in recliner, has finished eating breakfast. Facial grimacing and resp slightly labored. Pt states she is, just feeling so short winded!. RR 24, breath sounds clear and diminished bilaterally. SaO2 89-90% on room air. HR irregular 110-115. Skin warm and dry, color appropriate.  Duoneb administered for SOB.

## 2023-12-29 NOTE — Plan of Care (Signed)
  Problem: Education: Goal: Knowledge of General Education information will improve Description: Including pain rating scale, medication(s)/side effects and non-pharmacologic comfort measures Outcome: Adequate for Discharge   Problem: Health Behavior/Discharge Planning: Goal: Ability to manage health-related needs will improve Outcome: Adequate for Discharge   Problem: Clinical Measurements: Goal: Ability to maintain clinical measurements within normal limits will improve Outcome: Adequate for Discharge Goal: Will remain free from infection Outcome: Adequate for Discharge Goal: Diagnostic test results will improve Outcome: Adequate for Discharge Goal: Respiratory complications will improve Outcome: Adequate for Discharge Goal: Cardiovascular complication will be avoided Outcome: Adequate for Discharge   Problem: Activity: Goal: Risk for activity intolerance will decrease Outcome: Adequate for Discharge   Problem: Nutrition: Goal: Adequate nutrition will be maintained Outcome: Adequate for Discharge   Problem: Coping: Goal: Level of anxiety will decrease Outcome: Adequate for Discharge   Problem: Elimination: Goal: Will not experience complications related to bowel motility Outcome: Adequate for Discharge Goal: Will not experience complications related to urinary retention Outcome: Adequate for Discharge   Problem: Pain Managment: Goal: General experience of comfort will improve and/or be controlled Outcome: Adequate for Discharge   Problem: Safety: Goal: Ability to remain free from injury will improve Outcome: Adequate for Discharge   Problem: Skin Integrity: Goal: Risk for impaired skin integrity will decrease Outcome: Adequate for Discharge   Problem: Activity: Goal: Ability to tolerate increased activity will improve Outcome: Adequate for Discharge   Problem: Clinical Measurements: Goal: Ability to maintain a body temperature in the normal range will  improve Outcome: Adequate for Discharge   Problem: Respiratory: Goal: Ability to maintain adequate ventilation will improve Outcome: Adequate for Discharge Goal: Ability to maintain a clear airway will improve Outcome: Adequate for Discharge   Problem: Education: Goal: Ability to describe self-care measures that may prevent or decrease complications (Diabetes Survival Skills Education) will improve Outcome: Adequate for Discharge Goal: Individualized Educational Video(s) Outcome: Adequate for Discharge   Problem: Coping: Goal: Ability to adjust to condition or change in health will improve Outcome: Adequate for Discharge   Problem: Fluid Volume: Goal: Ability to maintain a balanced intake and output will improve Outcome: Adequate for Discharge   Problem: Health Behavior/Discharge Planning: Goal: Ability to identify and utilize available resources and services will improve Outcome: Adequate for Discharge Goal: Ability to manage health-related needs will improve Outcome: Adequate for Discharge   Problem: Metabolic: Goal: Ability to maintain appropriate glucose levels will improve Outcome: Adequate for Discharge   Problem: Nutritional: Goal: Maintenance of adequate nutrition will improve Outcome: Adequate for Discharge Goal: Progress toward achieving an optimal weight will improve Outcome: Adequate for Discharge   Problem: Skin Integrity: Goal: Risk for impaired skin integrity will decrease Outcome: Adequate for Discharge   Problem: Tissue Perfusion: Goal: Adequacy of tissue perfusion will improve Outcome: Adequate for Discharge

## 2023-12-29 NOTE — Progress Notes (Signed)
 SATURATION QUALIFICATIONS: (This note is used to comply with regulatory documentation for home oxygen )  Patient Saturations on Room Air at Rest = 94%  Patient Saturations on Room Air while Ambulating = 94%  Pt dyspneic with exertion but no drop in SaO2. MD Maree updated.

## 2023-12-29 NOTE — [place] FL2 (Signed)
 [place]  MEDICAID FL2 LEVEL OF CARE FORM     IDENTIFICATION  Patient Name: Virginia Young Birthdate: 06-Nov-1940 Sex: female Admission Date (Current Location): 12/27/2023  [facility] and [place] Number:  [employer] and Address:  [facility],  [address], [place] [address]      Provider Number: [id number]  Attending Physician Name and Address:  Maree Adron BIRCH, DO  Relative Name and Phone Number:  Raguel Lango (Daughter)  [phone] (Mobile)    Current Level of Care: Hospital Recommended Level of Care: Assisted Living Facility Prior Approval Number:    Date Approved/Denied:   PASRR Number:    Discharge Plan: Domiciliary (Rest home) [facility])    Current Diagnoses: Patient Active Problem List   Diagnosis Date Noted   Acute respiratory failure with hypoxia (HCC) 12/28/2023   Dyslipidemia 12/28/2023   Paroxysmal atrial fibrillation (HCC) 12/28/2023   Type 2 diabetes mellitus without complications (HCC) 12/28/2023   Gout 12/28/2023   CAP (community acquired pneumonia) 12/27/2023   Pneumonia 04/01/2023   Hyponatremia 04/01/2023   Elevated serum creatinine 04/01/2023   Syncope 09/16/2020   Hypokalemia 09/16/2020   Hypoalbuminemia 09/16/2020   Abrasion of right arm 09/16/2020   Right sided weakness 09/16/2020   Accidental fall 09/16/2020   Syncope and collapse 09/16/2020   Acute CVA (cerebrovascular accident) (HCC) 09/15/2020   Pyometra due to chronic inflammatory disease of uterus 02/18/2020   Postmenopausal 08/11/2019   Vitamin D  deficiency 02/17/2019   Constipation 02/17/2019   Witnessed seizure-like activity (HCC) 02/03/2019   Chronic systolic HF (heart failure) (HCC) 10/08/2018   Educated about COVID-19 virus infection 10/08/2018   Dementia (HCC) 02/10/2018   S/P shoulder replacement, right 08/22/2017   Essential hypertension 02/19/2016   Voiding dysfunction 10/07/2014   SUI (stress urinary incontinence, female)  10/07/2014   Pathological fracture due to osteoporosis 04/06/2014   DOE (dyspnea on exertion) 07/23/2013   Helicobacter positive gastritis 06/21/2013   Orthostatic hypotension 06/10/2013   Recurrent UTI 05/06/2013   Neck mass 02/19/2013   Right groin mass 11/08/2011   Low back pain 11/08/2011   Lipoma of neck 06/21/2011   OVERACTIVE BLADDER 08/21/2010   Long term current use of anticoagulant 07/25/2010   Osteoarthritis 01/18/2010   KNEE PAIN 01/18/2010   FOOT PAIN 01/18/2010   Mixed hyperlipidemia 04/20/2009   GERD 04/17/2009   MITRAL REGURGITATION, MILD 08/30/2008   NICM (nonischemic cardiomyopathy) (HCC) 08/30/2008   Depression, major, single episode, moderate (HCC) 04/10/2007   DISEASE, MITRAL VALVE NEC/NOS 04/10/2007   Atrial fibrillation (HCC) 04/10/2007   STASIS ULCER 04/10/2007   VENOUS INSUFFICIENCY, LEGS 04/10/2007    Orientation RESPIRATION BLADDER Height & Weight     Self  Normal Incontinent Weight: 108.5 kg Height:  5' 11 (180.3 cm)  BEHAVIORAL SYMPTOMS/MOOD NEUROLOGICAL BOWEL NUTRITION STATUS      Incontinent Diet  AMBULATORY STATUS COMMUNICATION OF NEEDS Skin   Extensive Assist Verbally Normal                       Personal Care Assistance Level of Assistance  Bathing, Feeding, Dressing Bathing Assistance: Maximum assistance Feeding assistance: Limited assistance Dressing Assistance: Limited assistance     Functional Limitations Info  Sight, Hearing, Speech Sight Info: Impaired Hearing Info: Impaired Speech Info: Adequate    SPECIAL CARE FACTORS FREQUENCY  PT (By licensed PT)     PT Frequency: 3 times a week  Contractures Contractures Info: Not present    Additional Factors Info  Allergies, Code Status Code Status Info: FULL Allergies Info: Bactrium, Hydrocortisone, Lipitor, Lisinopril            Current Medications (12/29/2023):  This is the current hospital active medication list Current Facility-Administered  Medications  Medication Dose Route Frequency Provider Last Rate Last Admin   acetaminophen  (TYLENOL ) tablet 650 mg  650 mg Oral Q6H PRN Mansy, Jan A, MD       Or   acetaminophen  (TYLENOL ) suppository 650 mg  650 mg Rectal Q6H PRN Mansy, Jan A, MD       allopurinol  (ZYLOPRIM ) tablet 100 mg  100 mg Oral Daily Mansy, Jan A, MD   100 mg at 12/28/23 1107   apixaban  (ELIQUIS ) tablet 5 mg  5 mg Oral Q12H Mansy, Jan A, MD   5 mg at 12/28/23 2251   azithromycin  (ZITHROMAX ) 500 mg in sodium chloride  0.9 % 250 mL IVPB  500 mg Intravenous Q24H Mansy, Jan A, MD 250 mL/hr at 12/28/23 2339 500 mg at 12/28/23 2339   cefTRIAXone  (ROCEPHIN ) 2 g in sodium chloride  0.9 % 100 mL IVPB  2 g Intravenous Q24H Mansy, Jan A, MD 200 mL/hr at 12/28/23 2251 2 g at 12/28/23 2251   dextromethorphan -guaiFENesin  (MUCINEX  DM) 30-600 MG per 12 hr tablet 1 tablet  1 tablet Oral BID Maree, Pratik D, DO   1 tablet at 12/28/23 2251   divalproex  (DEPAKOTE  ER) 24 hr tablet 250 mg  250 mg Oral Daily Mansy, Jan A, MD   250 mg at 12/28/23 1107   docusate sodium  (COLACE) capsule 100 mg  100 mg Oral Daily Mansy, Jan A, MD   100 mg at 12/28/23 1107   DULoxetine  (CYMBALTA ) DR capsule 60 mg  60 mg Oral Daily Mansy, Jan A, MD   60 mg at 12/28/23 1107   fenofibrate  tablet 54 mg  54 mg Oral Daily Mansy, Jan A, MD   54 mg at 12/28/23 1107   folic acid  (FOLVITE ) tablet 1 mg  1 mg Oral Daily Mansy, Jan A, MD   1 mg at 12/28/23 1107   insulin  aspart (novoLOG ) injection 0-15 Units  0-15 Units Subcutaneous TID WC Mansy, Jan A, MD   3 Units at 12/28/23 1332   insulin  aspart (novoLOG ) injection 0-5 Units  0-5 Units Subcutaneous QHS Mansy, Jan A, MD       ipratropium-albuterol  (DUONEB) 0.5-2.5 (3) MG/3ML nebulizer solution 3 mL  3 mL Nebulization Q6H PRN Maree, Pratik D, DO   3 mL at 12/29/23 0841   lidocaine  (LIDODERM ) 5 % 1 patch  1 patch Transdermal Q24H Beatty, Celeste A, PA-C   1 patch at 12/28/23 2251   ondansetron  (ZOFRAN ) tablet 4 mg  4 mg Oral Q6H PRN  Mansy, Jan A, MD       Or   ondansetron  (ZOFRAN ) injection 4 mg  4 mg Intravenous Q6H PRN Mansy, Jan A, MD       polyethylene glycol (MIRALAX  / GLYCOLAX ) packet 17 g  17 g Oral Daily PRN Mansy, Jan A, MD       traZODone  (DESYREL ) tablet 25 mg  25 mg Oral QHS PRN Mansy, Jan A, MD   25 mg at 12/27/23 2320     Discharge Medications: Allergies as of 12/29/2023       Reactions   Bactrim  [sulfamethoxazole -trimethoprim ]    Mouth sores   Hydrocortisone Other (See Comments)   Unknown, no reaction listed on [facility]  Lipitor [atorvastatin  Calcium ]    myalgia   Lisinopril     cough        Medication List     TAKE these medications    allopurinol  100 MG tablet Commonly known as: ZYLOPRIM  Take 100 mg by mouth daily.   azithromycin  500 MG tablet Commonly known as: Zithromax  Take 1 tablet (500 mg total) by mouth daily for 3 days.   cefdinir  300 MG capsule Commonly known as: OMNICEF  Take 1 capsule (300 mg total) by mouth 2 (two) times daily for 3 days.   dextromethorphan  30 MG/5ML liquid Commonly known as: DELSYM  Take 5 mLs (30 mg total) by mouth 2 (two) times daily as needed for cough.   divalproex  250 MG 24 hr tablet Commonly known as: DEPAKOTE  ER Take 1 tablet (250 mg total) by mouth daily.   docusate sodium  100 MG capsule Commonly known as: COLACE Take 100 mg by mouth daily.   DULoxetine  30 MG capsule Commonly known as: CYMBALTA  Take 1 capsule (30 mg total) by mouth daily.   Eliquis  5 MG Tabs tablet Generic drug: apixaban  TAKE 1 TABLET BY MOUTH EVERY 12 HOURS   fenofibrate  48 MG tablet Commonly known as: TRICOR  Take 48 mg by mouth daily.   folic acid  1 MG tablet Commonly known as: FOLVITE  Take 1 mg by mouth daily.   ipratropium-albuterol  0.5-2.5 (3) MG/3ML Soln Commonly known as: DUONEB Take 3 mLs by nebulization 3 (three) times daily.   OXYGEN  Inhale into the lungs. 2 liters PRN - anytime when needed ( cardio rx'd)   polyethylene glycol 17 g packet Commonly  known as: MIRALAX  / GLYCOLAX  Take 17 g by mouth daily as needed for mild constipation.         Relevant Imaging Results:  Relevant Lab Results:   Additional Information SS# [id number]     Home Health with [facility] Sharlyne Stabs, RN

## 2023-12-29 NOTE — TOC Transition Note (Signed)
 Transition of Care [facility]) - Discharge Note   Patient Details  Name: Virginia Young MRN: [id number] Date of Birth: [birth year]  [facility] of [facility] CM/SW Contact:  Sharlyne Stabs, RN Phone Number: 12/29/2023, 11:58 AM   Clinical Narrative:   Patient is medically ready to discharge back to [facility]. CM spoke with Nathanel to review FL2, DC summary and FL2 faxed. TOC will schedule [facility] wheelchair fleeta when RN is ready.  MD spoke with her daughter.  Nathanel requested HHPT with [facility], MD ordered. TOC referred to Sarah.     Final next level of care: Assisted Living Barriers to Discharge: Barriers Resolved   Patient Goals and CMS Choice Patient states their goals for this hospitalization and ongoing recovery are:: Return home to [facility]          Discharge Placement                Patient to be transferred to facility by: [facility]   Patient and family notified of of transfer: 12/29/23  Discharge Plan and Services Additional resources added to the After Visit Summary for   In-house Referral: Clinical Social Work Discharge Planning Services: CM Consult                      HH Arranged: PT HH Agency: Other - See comment Damita) Date HH Agency Contacted: 12/29/23 Time HH Agency Contacted: 1158 Representative spoke with at [facility] Agency: [facility]  Social Drivers of Health (SDOH) Interventions SDOH Screenings   Food Insecurity: No Food Insecurity (12/27/2023)  Housing: Low Risk  (12/27/2023)  Transportation Needs: No Transportation Needs (12/27/2023)  Utilities: Not At Risk (12/27/2023)  Alcohol Screen: Low Risk  (03/13/2022)  Depression (PHQ2-9): Low Risk  (03/13/2022)  Financial Resource Strain: Low Risk  (03/13/2022)  Physical Activity: Inactive (03/13/2022)  Social Connections: Moderately Isolated (12/27/2023)  Stress: No Stress Concern Present (03/13/2022)  Tobacco Use: Low Risk  (04/03/2023)     Readmission Risk Interventions    12/28/2023    7:37 PM   Readmission Risk Prevention Plan  Post Dischage Appt Complete  Medication Screening Complete  Transportation Screening Complete

## 2023-12-29 NOTE — Progress Notes (Signed)
 Pt discharged via WC to [facility] [employer] for trip back to [place]. AVS sent with patient. Attempted to call pt report but unable to reach anyone.

## 2023-12-29 NOTE — Discharge Summary (Signed)
 Physician Discharge Summary  Virginia Young FMW:[id number] DOB: October 11, 1940 DOA: 12/27/2023  PCP: Pia Kerney SQUIBB, MD  Admit date: 12/27/2023  Discharge date: 12/29/2023  Admitted From: ALF  Disposition: ALF  Recommendations for Outpatient Follow-up:  Follow up with PCP in 1-2 weeks Remain on azithromycin  and Omnicef  as prescribed for 3 more days to complete course of treatment for bronchitis/community-acquired pneumonia Repeat chest x-ray in 3-4 weeks to ensure resolution of the opacity on chest x-ray Continue other home medications as prior  Home Health: Yes with PT/OT  Equipment/Devices: None  Discharge Condition:Stable  CODE STATUS: Full  Diet recommendation: Heart Healthy/carb modified  Brief/Interim Summary: Virginia Young is a 83 y.o. female with medical history significant for atrial fibrillation on Eliquis , chronic systolic CHF, type 2 diabetes mellitus, GERD, depression, anemia and MS as well as an ICM, osteoporosis and osteoarthritis, presented to the emergency room with acute onset of fall with subsequent occipital head injury without presyncope or syncope.  She has been having cough productive of yellow sputum as well as dyspnea and wheezing over the last few days.  She has been admitted for treatment of community-acquired pneumonia.  She was also noted to have acute hypoxemic respiratory failure and required nasal cannula oxygen  supplementation which has now been weaned.  Further testing for viral illness has remained negative and she has responded well to IV antibiotics and can be switched to oral dosing at this point.  She has been assessed by PT with recommendations for physical therapy at ALF.  No other acute events or concerns noted and she is stable for discharge today.  Discharge Diagnoses:  Principal Problem:   CAP (community acquired pneumonia) Active Problems:   Acute respiratory failure with hypoxia (HCC)   Dyslipidemia   Paroxysmal atrial fibrillation  (HCC)   Type 2 diabetes mellitus without complications (HCC)   Gout  Principal discharge diagnoses: Acute hypoxemic respiratory failure secondary to community-acquired pneumonia/bronchitis.  Discharge Instructions  Discharge Instructions     Diet - low sodium heart healthy   Complete by: As directed    Increase activity slowly   Complete by: As directed       Allergies as of 12/29/2023       Reactions   Bactrim  [sulfamethoxazole -trimethoprim ]    Mouth sores   Hydrocortisone Other (See Comments)   Unknown, no reaction listed on MAR   Lipitor [atorvastatin  Calcium ]    myalgia   Lisinopril     cough        Medication List     TAKE these medications    allopurinol  100 MG tablet Commonly known as: ZYLOPRIM  Take 100 mg by mouth daily.   azithromycin  500 MG tablet Commonly known as: Zithromax  Take 1 tablet (500 mg total) by mouth daily for 3 days.   cefdinir  300 MG capsule Commonly known as: OMNICEF  Take 1 capsule (300 mg total) by mouth 2 (two) times daily for 3 days.   dextromethorphan  30 MG/5ML liquid Commonly known as: DELSYM  Take 5 mLs (30 mg total) by mouth 2 (two) times daily as needed for cough.   divalproex  250 MG 24 hr tablet Commonly known as: DEPAKOTE  ER Take 1 tablet (250 mg total) by mouth daily.   docusate sodium  100 MG capsule Commonly known as: COLACE Take 100 mg by mouth daily.   DULoxetine  30 MG capsule Commonly known as: CYMBALTA  Take 1 capsule (30 mg total) by mouth daily.   Eliquis  5 MG Tabs tablet Generic drug: apixaban  TAKE 1 TABLET BY  MOUTH EVERY 12 HOURS   fenofibrate  48 MG tablet Commonly known as: TRICOR  Take 48 mg by mouth daily.   folic acid  1 MG tablet Commonly known as: FOLVITE  Take 1 mg by mouth daily.   ipratropium-albuterol  0.5-2.5 (3) MG/3ML Soln Commonly known as: DUONEB Take 3 mLs by nebulization 3 (three) times daily.   OXYGEN  Inhale into the lungs. 2 liters PRN - anytime when needed ( cardio rx'd)    polyethylene glycol 17 g packet Commonly known as: MIRALAX  / GLYCOLAX  Take 17 g by mouth daily as needed for mild constipation.        Follow-up Information     Pia Kerney SQUIBB, MD. Schedule an appointment as soon as possible for a visit in 1 week(s).   Specialty: Internal Medicine Contact information: [address] [place] [address] [phone]                Allergies  Allergen Reactions   Bactrim  [Sulfamethoxazole -Trimethoprim ]     Mouth sores   Hydrocortisone Other (See Comments)    Unknown, no reaction listed on MAR   Lipitor [Atorvastatin  Calcium ]     myalgia   Lisinopril      cough    Consultations: None   Procedures/Studies: DG Chest Portable 1 View Result Date: 12/27/2023 CLINICAL DATA:  hypoxia EXAM: PORTABLE CHEST 1 VIEW COMPARISON:  Chest x-ray 04/01/2023, CT C-spine 12/27/2023 FINDINGS: The heart and mediastinal contours are unchanged. Atherosclerotic plaque. Right costophrenic angle nodule like airspace opacity. No focal consolidation. Chronic coarsened interstitial markings with no overt pulmonary edema. No pleural effusion. No pneumothorax. No acute osseous abnormality.  Right shoulder arthroplasty. IMPRESSION: 1. Right costophrenic angle nodule like airspace opacity. Followup PA and lateral chest X-ray is recommended in 3-4 weeks following therapy to ensure resolution. 2. Aortic Atherosclerosis (ICD10-I70.0) and Emphysema (ICD10-J43.9). Electronically Signed   By: Morgane  Naveau M.D.   On: 12/27/2023 20:01   CT PELVIS WO CONTRAST Result Date: 12/27/2023 CLINICAL DATA:  Hip trauma, fracture suspected, xray done.  Fall EXAM: CT PELVIS WITHOUT CONTRAST TECHNIQUE: Multidetector CT imaging of the pelvis was performed following the standard protocol without intravenous contrast. RADIATION DOSE REDUCTION: This exam was performed according to the departmental dose-optimization program which includes automated exposure control, adjustment of the mA  and/or kV according to patient size and/or use of iterative reconstruction technique. COMPARISON:  CT lumbar spine 12/27/2023 FINDINGS: Urinary Tract:  No abnormality visualized. Bowel: Colonic diverticulosis. Unremarkable visualized pelvic bowel loops. Vascular/Lymphatic: No pathologically enlarged lymph nodes. No significant vascular abnormality seen. Reproductive: Calcified uterine fibroids. No mass or other significant abnormality Other: No intraperitoneal free fluid. No intraperitoneal free gas. No organized fluid collection. Musculoskeletal: No acute displaced fracture or dislocation of either hips. No acute displaced fracture or diastasis of the bones of the pelvis. Densely sclerotic lesion of the right sacral al is chronic and likely represents a bone island. No severe degenerative changes. Diffusely decreased bone density. IMPRESSION: 1. Negative for acute traumatic injury. 2. Diffusely decreased bone density. Electronically Signed   By: Morgane  Naveau M.D.   On: 12/27/2023 20:00   CT Lumbar Spine Wo Contrast Result Date: 12/27/2023 CLINICAL DATA:  Back trauma, no prior imaging (Age >= 16y) pt fall going to bathroom. Denies LOC. Pt on blood thinners. Main complaint of hip and back pain with minor shortness of breath. EMS placed pt on 3 lpm o2 [facility] EXAM: CT LUMBAR SPINE WITHOUT CONTRAST TECHNIQUE: Multidetector CT imaging of the lumbar spine was performed without intravenous  contrast administration. Multiplanar CT image reconstructions were also generated. RADIATION DOSE REDUCTION: This exam was performed according to the departmental dose-optimization program which includes automated exposure control, adjustment of the mA and/or kV according to patient size and/or use of iterative reconstruction technique. COMPARISON:  CT abdomen pelvis and CT lumbar spine 06/22/2021 FINDINGS: Segmentation: 5 lumbar type vertebrae. Alignment: Levoscoliosis of the thoracolumbar spine centered at the L2 level. 6 mm rightward  translocation of the L4 vertebral body in relation to the L3. Vertebrae: Multilevel severe degenerative changes of the spine. No associated severe osseous neural foraminal or central canal stenosis. No acute fracture or focal pathologic process. Paraspinal and other soft tissues: Negative. Disc levels: Multilevel intervertebral disc space vacuum phenomenon. Other: Atherosclerotic plaque.  Colonic diverticulosis. IMPRESSION: 1. No acute displaced fracture or traumatic listhesis of the lumbar spine. 2.  Aortic Atherosclerosis (ICD10-I70.0). Electronically Signed   By: Morgane  Naveau M.D.   On: 12/27/2023 19:58   DG Lumbar Spine Complete Result Date: 12/27/2023 CLINICAL DATA:  Fall with back pain EXAM: LUMBAR SPINE - COMPLETE 4+ VIEW COMPARISON:  CT 06/22/2021 FINDINGS: Levoscoliosis. Vertebral body heights are maintained. Advanced multilevel degenerative changes with diffuse disc space narrowing and osteophyte. Multilevel facet degenerative changes. Aortic atherosclerosis IMPRESSION: Levoscoliosis with advanced multilevel degenerative changes. No definite acute osseous abnormality Electronically Signed   By: Luke Bun M.D.   On: 12/27/2023 17:28   DG Knee Complete 4 Views Right Result Date: 12/27/2023 CLINICAL DATA:  Fall with knee pain EXAM: RIGHT KNEE - COMPLETE 4+ VIEW COMPARISON:  06/22/2021 FINDINGS: No definitive acute fracture or malalignment. Severe tricompartment arthritis with bone on bone contact medial and patellofemoral joint spaces. Small knee effusion. Extensive vascular calcifications. IMPRESSION: No definitive acute osseous abnormality. Severe tricompartment arthritis. Small knee effusion. Electronically Signed   By: Luke Bun M.D.   On: 12/27/2023 17:27   DG Hip Unilat With Pelvis 2-3 Views Right Result Date: 12/27/2023 CLINICAL DATA:  Fall, right hip pain EXAM: DG HIP (WITH OR WITHOUT PELVIS) 2-3V RIGHT COMPARISON:  None Available. FINDINGS: Symmetric degenerative changes in the hips  with joint space narrowing and spurring. No acute bony abnormality. Specifically, no fracture, subluxation, or dislocation. IMPRESSION: No acute bony abnormality. Electronically Signed   By: Franky Crease M.D.   On: 12/27/2023 17:25   CT Cervical Spine Wo Contrast Result Date: 12/27/2023 CLINICAL DATA:  Neck trauma. Fall with head injury. On blood thinners. EXAM: CT CERVICAL SPINE WITHOUT CONTRAST TECHNIQUE: Multidetector CT imaging of the cervical spine was performed without intravenous contrast. Multiplanar CT image reconstructions were also generated. RADIATION DOSE REDUCTION: This exam was performed according to the departmental dose-optimization program which includes automated exposure control, adjustment of the mA and/or kV according to patient size and/or use of iterative reconstruction technique. COMPARISON:  Cervical spine radiographs 08/15/2020. Cervical MRI 07/25/2016. FINDINGS: Alignment: Reversal of the usual cervical lordosis, similar to prior imaging. Minimal stepwise anterolisthesis at C3-4, C4-5 and C5-6. Skull base and vertebrae: No evidence of acute cervical spine fracture or traumatic subluxation. Soft tissues and spinal canal: No prevertebral fluid or swelling. No visible canal hematoma. Disc levels: Multilevel spondylosis with disc space narrowing, endplate osteophytes and uncinate spurring, most advanced at C5-6 and C6-7. There is multilevel facet arthropathy. No large disc herniation or high-grade foraminal narrowing demonstrated. There is multilevel foraminal narrowing which appears worst on the right at C3-4 and C4-5 and on the left at C5-6. Upper chest: Emphysematous changes at the lung apices. Other: Bilateral carotid atherosclerosis.  Previous right shoulder reverse arthroplasty. IMPRESSION: 1. No evidence of acute cervical spine fracture, traumatic subluxation or static signs of instability. 2. Multilevel cervical spondylosis as described. Electronically Signed   By: Elsie Perone  M.D.   On: 12/27/2023 17:18   CT Head Wo Contrast Result Date: 12/27/2023 CLINICAL DATA:  Head trauma, minor (Age >= 65y).  Fall. EXAM: CT HEAD WITHOUT CONTRAST TECHNIQUE: Contiguous axial images were obtained from the base of the skull through the vertex without intravenous contrast. RADIATION DOSE REDUCTION: This exam was performed according to the departmental dose-optimization program which includes automated exposure control, adjustment of the mA and/or kV according to patient size and/or use of iterative reconstruction technique. COMPARISON:  09/15/2020 FINDINGS: Brain: There is atrophy and chronic small vessel disease changes. Old left thalamic lacunar infarct. No acute intracranial abnormality. Specifically, no hemorrhage, hydrocephalus, mass lesion, acute infarction, or significant intracranial injury. Vascular: No hyperdense vessel or unexpected calcification. Skull: No acute calvarial abnormality. Sinuses/Orbits: No acute findings Other: None IMPRESSION: Atrophy, chronic microvascular disease. No acute intracranial abnormality. Old left thalamic lacunar infarct. Electronically Signed   By: Franky Crease M.D.   On: 12/27/2023 17:16     Discharge Exam: Vitals:   12/29/23 0842 12/29/23 0900  BP: (!) 141/95   Pulse: (!) 111 (!) 110  Resp: (!) 24 (!) 21  Temp:    SpO2: 90% 93%   Vitals:   12/28/23 2359 12/29/23 0420 12/29/23 0842 12/29/23 0900  BP: (!) 168/72 (!) 176/67 (!) 141/95   Pulse: 94 86 (!) 111 (!) 110  Resp: 18 17 (!) 24 (!) 21  Temp: 97.8 F (36.6 C) 98.9 F (37.2 C)    TempSrc: Oral Oral    SpO2: 94% 90% 90% 93%  Weight:      Height:        General: Pt is alert, awake, not in acute distress Cardiovascular: RRR, S1/S2 +, no rubs, no gallops Respiratory: CTA bilaterally, no wheezing, no rhonchi Abdominal: Soft, NT, ND, bowel sounds + Extremities: no edema, no cyanosis    The results of significant diagnostics from this hospitalization (including imaging,  microbiology, ancillary and laboratory) are listed below for reference.     Microbiology: Recent Results (from the past 240 hours)  Respiratory (~20 pathogens) panel by PCR     Status: None   Collection Time: 12/28/23 11:17 AM   Specimen: Nasopharyngeal Swab; Respiratory  Result Value Ref Range Status   Adenovirus NOT DETECTED NOT DETECTED Final   Coronavirus 229E NOT DETECTED NOT DETECTED Final    Comment: (NOTE) The Coronavirus on the Respiratory Panel, DOES NOT test for the novel  Coronavirus (2019 nCoV)    Coronavirus HKU1 NOT DETECTED NOT DETECTED Final   Coronavirus NL63 NOT DETECTED NOT DETECTED Final   Coronavirus OC43 NOT DETECTED NOT DETECTED Final   Metapneumovirus NOT DETECTED NOT DETECTED Final   Rhinovirus / Enterovirus NOT DETECTED NOT DETECTED Final   Influenza A NOT DETECTED NOT DETECTED Final   Influenza B NOT DETECTED NOT DETECTED Final   Parainfluenza Virus 1 NOT DETECTED NOT DETECTED Final   Parainfluenza Virus 2 NOT DETECTED NOT DETECTED Final   Parainfluenza Virus 3 NOT DETECTED NOT DETECTED Final   Parainfluenza Virus 4 NOT DETECTED NOT DETECTED Final   Respiratory Syncytial Virus NOT DETECTED NOT DETECTED Final   Bordetella pertussis NOT DETECTED NOT DETECTED Final   Bordetella Parapertussis NOT DETECTED NOT DETECTED Final   Chlamydophila pneumoniae NOT DETECTED NOT DETECTED Final   Mycoplasma  pneumoniae NOT DETECTED NOT DETECTED Final    Comment: Performed at [facility], [address], [place], [place] [address]     Labs: BNP (last 3 results) Recent Labs    04/01/23 1353  BNP 204.0*   Basic Metabolic Panel: Recent Labs  Lab 12/27/23 1921 12/28/23 0410 12/29/23 0425  NA 132* 134* 134*  K 3.7 3.4* 4.2  CL 94* 96* 98  CO2 29 29 26   GLUCOSE 127* 104* 106*  BUN 14 12 8   CREATININE 0.89 0.81 0.71  CALCIUM  8.5* 8.3* 8.8*  MG  --   --  1.9   Liver Function Tests: Recent Labs  Lab 12/27/23 1921  AST 18  ALT 13  ALKPHOS 85   BILITOT 0.7  PROT 6.3*  ALBUMIN 3.0*   No results for input(s): LIPASE, AMYLASE in the last 168 hours. No results for input(s): AMMONIA in the last 168 hours. CBC: Recent Labs  Lab 12/27/23 1921 12/28/23 0410 12/29/23 0425  WBC 9.2 8.4 9.0  NEUTROABS 5.8  --   --   HGB 12.8 12.7 13.4  HCT 38.8 39.9 40.7  MCV 87.6 87.5 86.4  PLT 257 257 279   Cardiac Enzymes: No results for input(s): CKTOTAL, CKMB, CKMBINDEX, TROPONINI in the last 168 hours. BNP: Invalid input(s): POCBNP CBG: Recent Labs  Lab 12/28/23 1100 12/28/23 1632 12/28/23 2124 12/29/23 0712 12/29/23 1112  GLUCAP 181* 92 129* 117* 246*   D-Dimer No results for input(s): DDIMER in the last 72 hours. Hgb A1c Recent Labs    12/28/23 0410  HGBA1C 6.7*   Lipid Profile No results for input(s): CHOL, HDL, LDLCALC, TRIG, CHOLHDL, LDLDIRECT in the last 72 hours. Thyroid  function studies No results for input(s): TSH, T4TOTAL, T3FREE, THYROIDAB in the last 72 hours.  Invalid input(s): FREET3 Anemia work up No results for input(s): VITAMINB12, FOLATE, FERRITIN, TIBC, IRON, RETICCTPCT in the last 72 hours. Urinalysis    Component Value Date/Time   COLORURINE YELLOW 04/01/2023 1858   APPEARANCEUR CLEAR 04/01/2023 1858   APPEARANCEUR Cloudy (A) 09/12/2020 1321   LABSPEC 1.011 04/01/2023 1858   PHURINE 6.0 04/01/2023 1858   GLUCOSEU NEGATIVE 04/01/2023 1858   GLUCOSEU NEGATIVE 07/23/2013 1401   HGBUR NEGATIVE 04/01/2023 1858   BILIRUBINUR NEGATIVE 04/01/2023 1858   BILIRUBINUR Negative 09/12/2020 1321   KETONESUR NEGATIVE 04/01/2023 1858   PROTEINUR NEGATIVE 04/01/2023 1858   UROBILINOGEN 0.2 07/23/2013 1401   NITRITE NEGATIVE 04/01/2023 1858   LEUKOCYTESUR LARGE (A) 04/01/2023 1858   Sepsis Labs Recent Labs  Lab 12/27/23 1921 12/28/23 0410 12/29/23 0425  WBC 9.2 8.4 9.0   Microbiology Recent Results (from the past 240 hours)  Respiratory (~20  pathogens) panel by PCR     Status: None   Collection Time: 12/28/23 11:17 AM   Specimen: Nasopharyngeal Swab; Respiratory  Result Value Ref Range Status   Adenovirus NOT DETECTED NOT DETECTED Final   Coronavirus 229E NOT DETECTED NOT DETECTED Final    Comment: (NOTE) The Coronavirus on the Respiratory Panel, DOES NOT test for the novel  Coronavirus (2019 nCoV)    Coronavirus HKU1 NOT DETECTED NOT DETECTED Final   Coronavirus NL63 NOT DETECTED NOT DETECTED Final   Coronavirus OC43 NOT DETECTED NOT DETECTED Final   Metapneumovirus NOT DETECTED NOT DETECTED Final   Rhinovirus / Enterovirus NOT DETECTED NOT DETECTED Final   Influenza A NOT DETECTED NOT DETECTED Final   Influenza B NOT DETECTED NOT DETECTED Final   Parainfluenza Virus 1 NOT DETECTED NOT  DETECTED Final   Parainfluenza Virus 2 NOT DETECTED NOT DETECTED Final   Parainfluenza Virus 3 NOT DETECTED NOT DETECTED Final   Parainfluenza Virus 4 NOT DETECTED NOT DETECTED Final   Respiratory Syncytial Virus NOT DETECTED NOT DETECTED Final   Bordetella pertussis NOT DETECTED NOT DETECTED Final   Bordetella Parapertussis NOT DETECTED NOT DETECTED Final   Chlamydophila pneumoniae NOT DETECTED NOT DETECTED Final   Mycoplasma pneumoniae NOT DETECTED NOT DETECTED Final    Comment: Performed at [facility], [address], [place], [place] [address]     Time coordinating discharge: 35 minutes  SIGNED:   Adron JONETTA Fairly, DO [facility] 12/29/2023, 11:21 AM  If 7PM-7AM, please contact night-coverage [email or web address]

## 2023-12-29 NOTE — Progress Notes (Signed)
   12/29/23 0900  Vitals  Pulse Rate (!) 110  Pulse Rate Source Dinamap  Resp (!) 21  Level of Consciousness  Level of Consciousness Alert  Oxygen  Therapy  SpO2 93 %  O2 Device Room Air   Pt states breathing feels, much better. The air is moving a lot easier. Expiratory wheeze noted in right lower lung, more air movement auscultated bilaterally. Less dyspneic.

## 2023-12-29 NOTE — Plan of Care (Signed)
   Problem: Health Behavior/Discharge Planning: Goal: Ability to manage health-related needs will improve Outcome: Progressing   Problem: Clinical Measurements: Goal: Diagnostic test results will improve Outcome: Progressing

## 2024-01-13 ENCOUNTER — Inpatient Hospital Stay (HOSPITAL_COMMUNITY)
Admission: EM | Admit: 2024-01-13 | Discharge: 2024-01-17 | DRG: 640 | Disposition: A | Discharge status: Skilled Nursing Facility | Attending: Family Medicine | Admitting: Family Medicine

## 2024-01-13 ENCOUNTER — Other Ambulatory Visit: Payer: Self-pay

## 2024-01-13 ENCOUNTER — Emergency Department (HOSPITAL_COMMUNITY)

## 2024-01-13 ENCOUNTER — Encounter (HOSPITAL_COMMUNITY): Payer: Self-pay

## 2024-01-13 DIAGNOSIS — E876 Hypokalemia: Secondary | ICD-10-CM | POA: Diagnosis not present

## 2024-01-13 DIAGNOSIS — I480 Paroxysmal atrial fibrillation: Secondary | ICD-10-CM | POA: Diagnosis present

## 2024-01-13 DIAGNOSIS — Z7901 Long term (current) use of anticoagulants: Secondary | ICD-10-CM

## 2024-01-13 DIAGNOSIS — E785 Hyperlipidemia, unspecified: Secondary | ICD-10-CM | POA: Diagnosis present

## 2024-01-13 DIAGNOSIS — R0902 Hypoxemia: Secondary | ICD-10-CM

## 2024-01-13 DIAGNOSIS — K219 Gastro-esophageal reflux disease without esophagitis: Secondary | ICD-10-CM | POA: Diagnosis present

## 2024-01-13 DIAGNOSIS — Z833 Family history of diabetes mellitus: Secondary | ICD-10-CM

## 2024-01-13 DIAGNOSIS — Z888 Allergy status to other drugs, medicaments and biological substances status: Secondary | ICD-10-CM

## 2024-01-13 DIAGNOSIS — J9601 Acute respiratory failure with hypoxia: Secondary | ICD-10-CM | POA: Diagnosis present

## 2024-01-13 DIAGNOSIS — G35 Multiple sclerosis: Secondary | ICD-10-CM | POA: Diagnosis present

## 2024-01-13 DIAGNOSIS — W19XXXA Unspecified fall, initial encounter: Principal | ICD-10-CM | POA: Diagnosis present

## 2024-01-13 DIAGNOSIS — I110 Hypertensive heart disease with heart failure: Secondary | ICD-10-CM | POA: Diagnosis present

## 2024-01-13 DIAGNOSIS — Z96611 Presence of right artificial shoulder joint: Secondary | ICD-10-CM | POA: Diagnosis present

## 2024-01-13 DIAGNOSIS — E860 Dehydration: Principal | ICD-10-CM | POA: Diagnosis present

## 2024-01-13 DIAGNOSIS — Z79899 Other long term (current) drug therapy: Secondary | ICD-10-CM

## 2024-01-13 DIAGNOSIS — M1990 Unspecified osteoarthritis, unspecified site: Secondary | ICD-10-CM | POA: Diagnosis present

## 2024-01-13 DIAGNOSIS — Z823 Family history of stroke: Secondary | ICD-10-CM

## 2024-01-13 DIAGNOSIS — I428 Other cardiomyopathies: Secondary | ICD-10-CM | POA: Diagnosis present

## 2024-01-13 DIAGNOSIS — E871 Hypo-osmolality and hyponatremia: Secondary | ICD-10-CM | POA: Diagnosis present

## 2024-01-13 DIAGNOSIS — G9341 Metabolic encephalopathy: Secondary | ICD-10-CM | POA: Diagnosis present

## 2024-01-13 DIAGNOSIS — M810 Age-related osteoporosis without current pathological fracture: Secondary | ICD-10-CM | POA: Diagnosis present

## 2024-01-13 DIAGNOSIS — Z820 Family history of epilepsy and other diseases of the nervous system: Secondary | ICD-10-CM

## 2024-01-13 DIAGNOSIS — J449 Chronic obstructive pulmonary disease, unspecified: Secondary | ICD-10-CM | POA: Diagnosis present

## 2024-01-13 DIAGNOSIS — I5022 Chronic systolic (congestive) heart failure: Secondary | ICD-10-CM | POA: Diagnosis present

## 2024-01-13 DIAGNOSIS — Z882 Allergy status to sulfonamides status: Secondary | ICD-10-CM

## 2024-01-13 DIAGNOSIS — R296 Repeated falls: Secondary | ICD-10-CM | POA: Diagnosis present

## 2024-01-13 DIAGNOSIS — S0003XA Contusion of scalp, initial encounter: Secondary | ICD-10-CM | POA: Diagnosis present

## 2024-01-13 DIAGNOSIS — Z881 Allergy status to other antibiotic agents status: Secondary | ICD-10-CM

## 2024-01-13 DIAGNOSIS — W010XXA Fall on same level from slipping, tripping and stumbling without subsequent striking against object, initial encounter: Secondary | ICD-10-CM | POA: Diagnosis present

## 2024-01-13 DIAGNOSIS — F32A Depression, unspecified: Secondary | ICD-10-CM | POA: Diagnosis present

## 2024-01-13 DIAGNOSIS — I872 Venous insufficiency (chronic) (peripheral): Secondary | ICD-10-CM

## 2024-01-13 DIAGNOSIS — J439 Emphysema, unspecified: Secondary | ICD-10-CM | POA: Diagnosis present

## 2024-01-13 DIAGNOSIS — I340 Nonrheumatic mitral (valve) insufficiency: Secondary | ICD-10-CM | POA: Diagnosis present

## 2024-01-13 DIAGNOSIS — Z809 Family history of malignant neoplasm, unspecified: Secondary | ICD-10-CM

## 2024-01-13 DIAGNOSIS — Z8249 Family history of ischemic heart disease and other diseases of the circulatory system: Secondary | ICD-10-CM

## 2024-01-13 LAB — BASIC METABOLIC PANEL WITH GFR
Anion gap: 17 — ABNORMAL HIGH (ref 5–15)
BUN: 28 mg/dL — ABNORMAL HIGH (ref 8–23)
CO2: 32 mmol/L (ref 22–32)
Calcium: 8.8 mg/dL — ABNORMAL LOW (ref 8.9–10.3)
Chloride: 80 mmol/L — ABNORMAL LOW (ref 98–111)
Creatinine, Ser: 1.33 mg/dL — ABNORMAL HIGH (ref 0.44–1.00)
GFR, Estimated: 39 mL/min — ABNORMAL LOW (ref >60)
Glucose, Bld: 133 mg/dL — ABNORMAL HIGH (ref 70–99)
Potassium: 2.5 mmol/L — CL (ref 3.5–5.1)
Sodium: 129 mmol/L — ABNORMAL LOW (ref 135–145)

## 2024-01-13 LAB — CBC
HCT: 43.2 % (ref 36.0–46.0)
Hemoglobin: 14.4 g/dL (ref 12.0–15.0)
MCH: 28.2 pg (ref 26.0–34.0)
MCHC: 33.3 g/dL (ref 30.0–36.0)
MCV: 84.5 fL (ref 80.0–100.0)
Platelets: 318 K/uL (ref 150–400)
RBC: 5.11 MIL/uL (ref 3.87–5.11)
RDW: 13.7 % (ref 11.5–15.5)
WBC: 7.4 K/uL (ref 4.0–10.5)
nRBC: 0 % (ref 0.0–0.2)

## 2024-01-13 LAB — BRAIN NATRIURETIC PEPTIDE: B Natriuretic Peptide: 108 pg/mL — ABNORMAL HIGH (ref 0.0–100.0)

## 2024-01-13 MED ORDER — POTASSIUM CHLORIDE 10 MEQ/100ML IV SOLN
10.0000 meq | INTRAVENOUS | Status: AC
  Administered 2024-01-13 – 2024-01-14 (×3): 10 meq via INTRAVENOUS
  Filled 2024-01-13 (×3): qty 100

## 2024-01-13 MED ORDER — IPRATROPIUM-ALBUTEROL 0.5-2.5 (3) MG/3ML IN SOLN
3.0000 mL | Freq: Once | RESPIRATORY_TRACT | Status: AC
  Administered 2024-01-13: 3 mL via RESPIRATORY_TRACT
  Filled 2024-01-13: qty 3

## 2024-01-13 MED ORDER — LACTATED RINGERS IV BOLUS
1000.0000 mL | Freq: Once | INTRAVENOUS | Status: AC
  Administered 2024-01-13: 1000 mL via INTRAVENOUS

## 2024-01-13 NOTE — ED Triage Notes (Signed)
 Pt came in by [facility], pt fell and self reported hitting the back of her head on the metal bedframe. No LOC. Bruising on back of the head w/ controlled bleeding.

## 2024-01-13 NOTE — ED Notes (Addendum)
 Date and time results received: 01/13/24 1033  Test: Potassium Critical Value: 2.5  Name of Provider Notified: [place], MD

## 2024-01-13 NOTE — ED Provider Notes (Addendum)
 [place] [facility] DEPARTMENT AT [facility] Provider Note   CSN: [id number] Arrival date & time: 01/13/24  2050     Patient presents with: Virginia Young is a 83 y.o. female.    Fall  Patient presents fall.  Tripped and fell striking the back of her head.  Is on anticoagulation.  No other injury.  Denies loss consciousness.  Denies shortness of breath.   Patient states she is not on oxygen  at home.  Reviewing notes it appears she previously had been on it.  When questioned further about it she states that she stopped it.  Had been titrated off oxygen  with recent admission to hospital.     Past Medical History:  Diagnosis Date   Allergy     Atrial fibrillation [facility])    coumadin    Cataract    Chest pain 03/2012   a. Lex MV 10/13:  EF 64%, dist ant and apical defect sugg of soft tissue atten, no ischemia   Chronic systolic heart failure (HCC)    Depression    GERD (gastroesophageal reflux disease)    HLD (hyperlipidemia)    Mild mitral regurgitation    MS (multiple sclerosis) (HCC)    NICM (nonischemic cardiomyopathy) (HCC)    a. neg CLite in 2003;  b. EF 40-45% in past;   c.  Echo 12/11: EF 35-40%, mild MR, mild LAE, mild RAE, small pericardial effusion    Osteoarthritis    Osteoporosis    Stasis ulcer (HCC)    Vaginal prolapse without uterine prolapse    Varicose veins     Prior to Admission medications   Medication Sig Start Date End Date Taking? Authorizing Provider  acetaminophen  (TYLENOL ) 325 MG tablet Take 650 mg by mouth 3 (three) times daily. 12/11/23   [provider]  amoxicillin -clavulanate (AUGMENTIN ) 500-125 MG tablet Take 1 tablet by mouth 2 (two) times daily. 10 days supply 12/25/23   [provider]  diclofenac  Sodium (VOLTAREN ) 1 % GEL Apply 2 g topically 2 (two) times daily. 12/14/23   [provider]  divalproex  (DEPAKOTE  ER) 250 MG 24 hr tablet Take 1 tablet (250 mg total) by mouth daily. 09/23/20    Samtani, Jai-Gurmukh, MD  docusate sodium  (COLACE) 100 MG capsule Take 100 mg by mouth daily.    [provider]  DULoxetine  (CYMBALTA ) 60 MG capsule Take 60 mg by mouth daily. 12/21/23   [provider]  ELIQUIS  5 MG TABS tablet TAKE 1 TABLET BY MOUTH EVERY 12 HOURS 11/16/20   Hawks, Christy A, FNP  fenofibrate  (TRICOR ) 48 MG tablet Take 48 mg by mouth daily. 09/25/21   [provider]  folic acid  (FOLVITE ) 1 MG tablet Take 1 mg by mouth daily.    [provider]  furosemide  (LASIX ) 40 MG tablet Take 40 mg by mouth daily. 12/16/23   [provider]  ipratropium-albuterol  (DUONEB) 0.5-2.5 (3) MG/3ML SOLN Take 3 mLs by nebulization 3 (three) times daily. 04/04/23   Johnson, Clanford L, MD  memantine  (NAMENDA ) 5 MG tablet Take 5 mg by mouth 2 (two) times daily. 12/16/23   [provider]  metoprolol  tartrate (LOPRESSOR ) 25 MG tablet Take 12.5 mg by mouth 2 (two) times daily. 12/16/23   [provider]  midodrine  (PROAMATINE ) 2.5 MG tablet Take 2.5 mg by mouth 3 (three) times daily. 12/16/23   [provider]  Multiple Vitamin (MULTIVITAMIN) tablet Take 1 tablet by mouth daily.    [provider]  NYAMYC  powder Apply 1 Application topically. 12/09/23   [provider]  omeprazole  (PRILOSEC) 40 MG capsule Take 40 mg by mouth daily. 09/07/14   [provider]  OXYGEN  Inhale into the lungs. 2 liters PRN - anytime when needed ( cardio rx'd)    [provider]  pantoprazole  (PROTONIX ) 40 MG tablet Take 40 mg by mouth 2 (two) times daily. 12/16/23   [provider]  polyethylene glycol (MIRALAX  / GLYCOLAX ) 17 g packet Take 17 g by mouth daily as needed for mild constipation. 04/04/23   Johnson, Clanford L, MD  QUEtiapine  (SEROQUEL ) 50 MG tablet Take 50 mg by mouth 2 (two) times daily. 12/16/23   [provider]  traMADol  (ULTRAM ) 50 MG tablet Take 50 mg by mouth daily as needed. 12/19/23    [provider]    Allergies: Bactrim  [sulfamethoxazole -trimethoprim ], Hydrocortisone, Lipitor [atorvastatin  calcium ], and Lisinopril     Review of Systems  Updated Vital Signs BP 102/62   Pulse 67   Temp 97.8 F (36.6 C)   Resp 18   Ht 5' 11 (1.803 m)   Wt 119.3 kg   SpO2 90%   BMI 36.68 kg/m   Physical Exam Vitals and nursing note reviewed.  HENT:     Head:     Comments: Right sided occipital hematoma.  Does have small laceration. Eyes:     Extraocular Movements: Extraocular movements intact.  Chest:     Chest wall: No tenderness.  Abdominal:     Tenderness: There is no abdominal tenderness.  Musculoskeletal:     Cervical back: No tenderness.     Comments: No extremity tenderness  Neurological:     Mental Status: She is alert and oriented to person, place, and time.     (all labs ordered are listed, but only abnormal results are displayed) Labs Reviewed  BASIC METABOLIC PANEL WITH GFR - Abnormal; Notable for the following components:      Result Value   Sodium 129 (*)    Potassium 2.5 (*)    Chloride 80 (*)    Glucose, Bld 133 (*)    BUN 28 (*)    Creatinine, Ser 1.33 (*)    Calcium  8.8 (*)    GFR, Estimated 39 (*)    Anion gap 17 (*)    All other components within normal limits  CBC  BRAIN NATRIURETIC PEPTIDE    EKG: EKG Interpretation Date/Time:  Tuesday January 13 2024 21:53:01 EDT Ventricular Rate:  62 PR Interval:    QRS Duration:  103 QT Interval:  447 QTC Calculation: 454 R Axis:   -32  Text Interpretation: Atrial fibrillation Ventricular premature complex Inferior infarct, old Consider anterior infarct Confirmed by Patsey Lot [phone]) on 01/13/2024 10:26:28 PM  Radiology: DG Chest Portable 1 View Result Date: 01/13/2024 CLINICAL DATA:  Hypoxia EXAM: PORTABLE CHEST 1 VIEW COMPARISON:  12/27/2023 FINDINGS: Hyperinflation with emphysema. Bronchitic changes at the bases without consolidation or pleural effusion. Stable  cardiomediastinal silhouette with aortic atherosclerosis. Dextroscoliosis. IMPRESSION: Hyperinflation with emphysema and bronchitic changes at the bases. Electronically Signed   By: Luke Bun M.D.   On: 01/13/2024 22:29   CT Head Wo Contrast Result Date: 01/13/2024 CLINICAL DATA:  Head and neck trauma bruising EXAM: CT HEAD WITHOUT CONTRAST CT CERVICAL SPINE WITHOUT CONTRAST TECHNIQUE: Multidetector CT imaging of the head and cervical spine was performed following the standard protocol without intravenous contrast. Multiplanar CT image reconstructions of the cervical spine were also generated. RADIATION DOSE REDUCTION: This  exam was performed according to the departmental dose-optimization program which includes automated exposure control, adjustment of the mA and/or kV according to patient size and/or use of iterative reconstruction technique. COMPARISON:  CT brain and cervical spine 12/27/2023 FINDINGS: CT HEAD FINDINGS Brain: No acute territorial infarction, hemorrhage or intracranial mass. Moderate severe atrophy. Moderate chronic small vessel ischemic changes of the white matter. Stable ventricle size Vascular: No hyperdense vessels.  Carotid vascular calcification Skull: Normal. Negative for fracture or focal lesion. Sinuses/Orbits: No acute finding. Other: Small right posterior scalp hematoma CT CERVICAL SPINE FINDINGS Alignment: Trace anterolisthesis C3 on C4, C4 on C5 and C5 on C6. Facet alignment is normal Skull base and vertebrae: No acute fracture. No primary bone lesion or focal pathologic process. Soft tissues and spinal canal: No prevertebral fluid or swelling. No visible canal hematoma. Disc levels: Multilevel degenerative change. Advanced disc space narrowing C5-C6, C6-C7 and C7-T1. Hypertrophic facet degenerative changes at multiple levels with foraminal narrowing. Upper chest: Negative. Other: None IMPRESSION: 1. No CT evidence for acute intracranial abnormality. Atrophy and chronic small  vessel ischemic changes of the white matter. 2. Degenerative changes of the cervical spine. No acute osseous abnormality. Electronically Signed   By: Luke Bun M.D.   On: 01/13/2024 21:38   CT Cervical Spine Wo Contrast Result Date: 01/13/2024 CLINICAL DATA:  Head and neck trauma bruising EXAM: CT HEAD WITHOUT CONTRAST CT CERVICAL SPINE WITHOUT CONTRAST TECHNIQUE: Multidetector CT imaging of the head and cervical spine was performed following the standard protocol without intravenous contrast. Multiplanar CT image reconstructions of the cervical spine were also generated. RADIATION DOSE REDUCTION: This exam was performed according to the departmental dose-optimization program which includes automated exposure control, adjustment of the mA and/or kV according to patient size and/or use of iterative reconstruction technique. COMPARISON:  CT brain and cervical spine 12/27/2023 FINDINGS: CT HEAD FINDINGS Brain: No acute territorial infarction, hemorrhage or intracranial mass. Moderate severe atrophy. Moderate chronic small vessel ischemic changes of the white matter. Stable ventricle size Vascular: No hyperdense vessels.  Carotid vascular calcification Skull: Normal. Negative for fracture or focal lesion. Sinuses/Orbits: No acute finding. Other: Small right posterior scalp hematoma CT CERVICAL SPINE FINDINGS Alignment: Trace anterolisthesis C3 on C4, C4 on C5 and C5 on C6. Facet alignment is normal Skull base and vertebrae: No acute fracture. No primary bone lesion or focal pathologic process. Soft tissues and spinal canal: No prevertebral fluid or swelling. No visible canal hematoma. Disc levels: Multilevel degenerative change. Advanced disc space narrowing C5-C6, C6-C7 and C7-T1. Hypertrophic facet degenerative changes at multiple levels with foraminal narrowing. Upper chest: Negative. Other: None IMPRESSION: 1. No CT evidence for acute intracranial abnormality. Atrophy and chronic small vessel ischemic changes  of the white matter. 2. Degenerative changes of the cervical spine. No acute osseous abnormality. Electronically Signed   By: Luke Bun M.D.   On: 01/13/2024 21:38     Procedures   Medications Ordered in the ED  ipratropium-albuterol  (DUONEB) 0.5-2.5 (3) MG/3ML nebulizer solution 3 mL (has no administration in time range)                                    Medical Decision Making Amount and/or Complexity of Data Reviewed Labs: ordered. Radiology: ordered.  Risk Prescription drug management.   Patient with mechanical fall.  Hit head.  Does have mild hypoxia but does use as needed oxygen .  Lungs clear.  No cough or shortness of breath.  Will get head CT and cervical spine CT.  Head CT and cervical spine CT reassuring.  Hematoma on back does not appear to have laceration that needs closing.  However will desat into the upper 80s without her oxygen .  Reviewing notes it appears that with admission in October she been started on oxygen .  However was not on it with recent admission to hospital and was titrated off.  Patient states she stopped the oxygen  herself.  However with hypoxia may need again.  Will get x-ray now and basic blood work.   X-ray showed some bronchitis.  Does have hypokalemia.  Return of hyponatremia and worsening kidney function.  With overall picture I feel she would benefit from mission to the hospital.  Will discuss with hospitalist.       Final diagnoses:  Fall, initial encounter  Scalp hematoma, initial encounter  Hypoxia  Hypokalemia    ED Discharge Orders     None          Patsey Lot, MD 01/13/24 2209    Patsey Lot, MD 01/13/24 2248

## 2024-01-13 NOTE — ED Provider Notes (Signed)
  Provider Note MRN:  [id number]  Arrival date & time: 01/13/24    ED Course and Medical Decision Making  Assumed care of patient at sign-out or upon transfer.  Fall on thinners with no significant traumatic injuries, electrolyte disturbance and hypoxia noted, accepted by hospitalist service for admission.  Profoundly low potassium, providing repletion.  .Critical Care  Performed by: Theadore Ozell HERO, MD Authorized by: Theadore Ozell HERO, MD   Critical care provider statement:    Critical care time (minutes):  35   Critical care was necessary to treat or prevent imminent or life-threatening deterioration of the following conditions:  Metabolic crisis   Critical care was time spent personally by me on the following activities:  Development of treatment plan with patient or surrogate, discussions with consultants, evaluation of patient's response to treatment, examination of patient, ordering and review of laboratory studies, ordering and review of radiographic studies, ordering and performing treatments and interventions, pulse oximetry, re-evaluation of patient's condition and review of old charts   Final Clinical Impressions(s) / ED Diagnoses     ICD-10-CM   1. Fall, initial encounter  W19.XXXA     2. Scalp hematoma, initial encounter  S00.03XA     3. Hypoxia  R09.02     4. Hypokalemia  E87.6       ED Discharge Orders     None       Discharge Instructions   None     Ozell HERO. Theadore, MD [facility] Emergency Medicine [facility] [email or web address]    Theadore Ozell HERO, MD 01/13/24 [phone]

## 2024-01-14 DIAGNOSIS — W010XXA Fall on same level from slipping, tripping and stumbling without subsequent striking against object, initial encounter: Secondary | ICD-10-CM | POA: Diagnosis present

## 2024-01-14 DIAGNOSIS — Z7901 Long term (current) use of anticoagulants: Secondary | ICD-10-CM | POA: Diagnosis not present

## 2024-01-14 DIAGNOSIS — J9601 Acute respiratory failure with hypoxia: Secondary | ICD-10-CM

## 2024-01-14 DIAGNOSIS — K219 Gastro-esophageal reflux disease without esophagitis: Secondary | ICD-10-CM | POA: Diagnosis present

## 2024-01-14 DIAGNOSIS — I428 Other cardiomyopathies: Secondary | ICD-10-CM | POA: Diagnosis present

## 2024-01-14 DIAGNOSIS — E876 Hypokalemia: Secondary | ICD-10-CM | POA: Diagnosis present

## 2024-01-14 DIAGNOSIS — W19XXXA Unspecified fall, initial encounter: Secondary | ICD-10-CM | POA: Diagnosis present

## 2024-01-14 DIAGNOSIS — G35 Multiple sclerosis: Secondary | ICD-10-CM | POA: Diagnosis present

## 2024-01-14 DIAGNOSIS — I480 Paroxysmal atrial fibrillation: Secondary | ICD-10-CM | POA: Diagnosis present

## 2024-01-14 DIAGNOSIS — I5022 Chronic systolic (congestive) heart failure: Secondary | ICD-10-CM | POA: Diagnosis present

## 2024-01-14 DIAGNOSIS — Z96611 Presence of right artificial shoulder joint: Secondary | ICD-10-CM | POA: Diagnosis present

## 2024-01-14 DIAGNOSIS — E860 Dehydration: Secondary | ICD-10-CM | POA: Diagnosis present

## 2024-01-14 DIAGNOSIS — Z8249 Family history of ischemic heart disease and other diseases of the circulatory system: Secondary | ICD-10-CM | POA: Diagnosis not present

## 2024-01-14 DIAGNOSIS — J449 Chronic obstructive pulmonary disease, unspecified: Secondary | ICD-10-CM | POA: Diagnosis present

## 2024-01-14 DIAGNOSIS — G9341 Metabolic encephalopathy: Secondary | ICD-10-CM | POA: Diagnosis present

## 2024-01-14 DIAGNOSIS — W19XXXD Unspecified fall, subsequent encounter: Secondary | ICD-10-CM

## 2024-01-14 DIAGNOSIS — R296 Repeated falls: Secondary | ICD-10-CM | POA: Diagnosis present

## 2024-01-14 DIAGNOSIS — Z833 Family history of diabetes mellitus: Secondary | ICD-10-CM | POA: Diagnosis not present

## 2024-01-14 DIAGNOSIS — M810 Age-related osteoporosis without current pathological fracture: Secondary | ICD-10-CM | POA: Diagnosis present

## 2024-01-14 DIAGNOSIS — I340 Nonrheumatic mitral (valve) insufficiency: Secondary | ICD-10-CM | POA: Diagnosis present

## 2024-01-14 DIAGNOSIS — E785 Hyperlipidemia, unspecified: Secondary | ICD-10-CM | POA: Diagnosis present

## 2024-01-14 DIAGNOSIS — S0003XA Contusion of scalp, initial encounter: Secondary | ICD-10-CM | POA: Diagnosis present

## 2024-01-14 DIAGNOSIS — F32A Depression, unspecified: Secondary | ICD-10-CM | POA: Diagnosis present

## 2024-01-14 DIAGNOSIS — I110 Hypertensive heart disease with heart failure: Secondary | ICD-10-CM | POA: Diagnosis present

## 2024-01-14 DIAGNOSIS — Z823 Family history of stroke: Secondary | ICD-10-CM | POA: Diagnosis not present

## 2024-01-14 DIAGNOSIS — E871 Hypo-osmolality and hyponatremia: Secondary | ICD-10-CM | POA: Diagnosis present

## 2024-01-14 DIAGNOSIS — J439 Emphysema, unspecified: Secondary | ICD-10-CM | POA: Diagnosis present

## 2024-01-14 LAB — BASIC METABOLIC PANEL WITH GFR
Anion gap: 11 (ref 5–15)
BUN: 22 mg/dL (ref 8–23)
CO2: 37 mmol/L — ABNORMAL HIGH (ref 22–32)
Calcium: 8.6 mg/dL — ABNORMAL LOW (ref 8.9–10.3)
Chloride: 81 mmol/L — ABNORMAL LOW (ref 98–111)
Creatinine, Ser: 1.12 mg/dL — ABNORMAL HIGH (ref 0.44–1.00)
GFR, Estimated: 48 mL/min — ABNORMAL LOW (ref >60)
Glucose, Bld: 147 mg/dL — ABNORMAL HIGH (ref 70–99)
Potassium: 3 mmol/L — ABNORMAL LOW (ref 3.5–5.1)
Sodium: 129 mmol/L — ABNORMAL LOW (ref 135–145)

## 2024-01-14 MED ORDER — DULOXETINE HCL 60 MG PO CPEP
60.0000 mg | ORAL_CAPSULE | Freq: Every day | ORAL | Status: DC
  Administered 2024-01-14: 60 mg via ORAL
  Filled 2024-01-14: qty 1

## 2024-01-14 MED ORDER — METOPROLOL TARTRATE 25 MG PO TABS
12.5000 mg | ORAL_TABLET | Freq: Two times a day (BID) | ORAL | Status: DC
Start: 2024-01-14 — End: 2024-01-17
  Administered 2024-01-14 – 2024-01-17 (×7): 12.5 mg via ORAL
  Filled 2024-01-14 (×7): qty 1

## 2024-01-14 MED ORDER — PANTOPRAZOLE SODIUM 40 MG PO TBEC
40.0000 mg | DELAYED_RELEASE_TABLET | Freq: Every day | ORAL | Status: DC
  Administered 2024-01-14: 40 mg via ORAL
  Filled 2024-01-14 (×2): qty 1

## 2024-01-14 MED ORDER — MEMANTINE HCL 10 MG PO TABS
5.0000 mg | ORAL_TABLET | Freq: Two times a day (BID) | ORAL | Status: DC
  Administered 2024-01-14 – 2024-01-17 (×7): 5 mg via ORAL
  Filled 2024-01-14 (×7): qty 1

## 2024-01-14 MED ORDER — LACTATED RINGERS IV BOLUS: 1000.0000 mL | Freq: Once | INTRAVENOUS | Status: DC

## 2024-01-14 MED ORDER — MIDODRINE HCL 5 MG PO TABS
2.5000 mg | ORAL_TABLET | Freq: Three times a day (TID) | ORAL | Status: DC
  Administered 2024-01-14 – 2024-01-17 (×9): 2.5 mg via ORAL
  Filled 2024-01-14 (×9): qty 1

## 2024-01-14 MED ORDER — ONDANSETRON HCL 4 MG/2ML IJ SOLN: 4.0000 mg | Freq: Four times a day (QID) | INTRAMUSCULAR | Status: DC | PRN

## 2024-01-14 MED ORDER — DOCUSATE SODIUM 100 MG PO CAPS
100.0000 mg | ORAL_CAPSULE | Freq: Every day | ORAL | Status: DC
  Administered 2024-01-14 – 2024-01-17 (×4): 100 mg via ORAL
  Filled 2024-01-14 (×4): qty 1

## 2024-01-14 MED ORDER — OXYCODONE HCL 5 MG PO TABS: 5.0000 mg | ORAL_TABLET | ORAL | Status: DC | PRN

## 2024-01-14 MED ORDER — ACETAMINOPHEN 650 MG RE SUPP: 650.0000 mg | Freq: Four times a day (QID) | RECTAL | Status: DC | PRN

## 2024-01-14 MED ORDER — ACETAMINOPHEN 325 MG PO TABS
650.0000 mg | ORAL_TABLET | Freq: Four times a day (QID) | ORAL | Status: DC | PRN
Start: 2024-01-14 — End: 2024-01-17

## 2024-01-14 MED ORDER — MAGNESIUM SULFATE 2 GM/50ML IV SOLN
2.0000 g | Freq: Once | INTRAVENOUS | Status: AC
  Administered 2024-01-14: 2 g via INTRAVENOUS
  Filled 2024-01-14: qty 50

## 2024-01-14 MED ORDER — QUETIAPINE FUMARATE 25 MG PO TABS
50.0000 mg | ORAL_TABLET | Freq: Two times a day (BID) | ORAL | Status: DC
  Administered 2024-01-14 – 2024-01-17 (×7): 50 mg via ORAL
  Filled 2024-01-14 (×7): qty 2

## 2024-01-14 MED ORDER — ONDANSETRON HCL 4 MG PO TABS: 4.0000 mg | ORAL_TABLET | Freq: Four times a day (QID) | ORAL | Status: DC | PRN

## 2024-01-14 MED ORDER — DULOXETINE HCL 30 MG PO CPEP
30.0000 mg | ORAL_CAPSULE | Freq: Every day | ORAL | Status: DC
  Administered 2024-01-15 – 2024-01-17 (×3): 30 mg via ORAL
  Filled 2024-01-14 (×4): qty 1

## 2024-01-14 MED ORDER — POTASSIUM CHLORIDE 2 MEQ/ML IV SOLN
INTRAVENOUS | Status: AC
  Filled 2024-01-14: qty 1000

## 2024-01-14 MED ORDER — POTASSIUM CHLORIDE 10 MEQ/100ML IV SOLN
10.0000 meq | INTRAVENOUS | Status: AC
  Administered 2024-01-14 (×6): 10 meq via INTRAVENOUS
  Filled 2024-01-14 (×4): qty 100

## 2024-01-14 MED ORDER — DIVALPROEX SODIUM ER 250 MG PO TB24
250.0000 mg | ORAL_TABLET | Freq: Every day | ORAL | Status: DC
  Administered 2024-01-14 – 2024-01-17 (×4): 250 mg via ORAL
  Filled 2024-01-14 (×5): qty 1

## 2024-01-14 MED ORDER — APIXABAN 5 MG PO TABS
5.0000 mg | ORAL_TABLET | Freq: Two times a day (BID) | ORAL | Status: DC
  Administered 2024-01-14 – 2024-01-17 (×7): 5 mg via ORAL
  Filled 2024-01-14 (×7): qty 1

## 2024-01-14 MED ORDER — FENOFIBRATE 54 MG PO TABS
54.0000 mg | ORAL_TABLET | Freq: Every day | ORAL | Status: DC
  Administered 2024-01-14 – 2024-01-17 (×4): 54 mg via ORAL
  Filled 2024-01-14 (×5): qty 1

## 2024-01-14 NOTE — Plan of Care (Signed)
  Problem: Acute Rehab PT Goals(only PT should resolve) Goal: Pt Will Go Supine/Side To Sit Outcome: Progressing Flowsheets (Taken 01/14/2024 1434) Pt will go Supine/Side to Sit: with modified independence Goal: Patient Will Transfer Sit To/From Stand Outcome: Progressing Flowsheets (Taken 01/14/2024 1434) Patient will transfer sit to/from stand: with minimal assist Goal: Pt Will Transfer Bed To Chair/Chair To Bed Outcome: Progressing Flowsheets (Taken 01/14/2024 1434) Pt will Transfer Bed to Chair/Chair to Bed: with contact guard assist Goal: Pt Will Ambulate Outcome: Progressing Flowsheets (Taken 01/14/2024 1434) Pt will Ambulate:  25 feet  with rolling walker  with contact guard assist

## 2024-01-14 NOTE — Evaluation (Signed)
 Occupational Therapy Evaluation Patient Details Name: Virginia Young MRN: [id number] DOB: 1940/10/22 Today's Date: 01/14/2024   History of Present Illness   Virginia Young is a 83 y.o. female with medical history significant of A-fib on Eliquis , depression, GERD, multiple sclerosis, CHF who presenting to the department after a fall.  Patient was just admitted on 7/5.  During this presentation she was at her assisted living facility when she had a fall and hit her head.  She was found to have pneumonia and completed the course of antibiotics.  PT evaluated her at that time and she was discharged back to assisted living with physical therapy.  Today she presented to the ER due to similar findings.  She had a mechanical fall tripped and fell.  She landed on the backside of her head.  She denied any loss of consciousness or other symptoms.  She was reportedly on oxygen  during her prior presentations however was not taking at this time.  In the ER she was notably hypoxic satting in the 80s on room air.  Lab's were obtained on presentation which showed potassium 2.5, creatinine 1.3, sodium 129, WBC 7.4, hemoglobin 14.4, BNP 808, chest x-ray showed hyperinflation consistent with emphysema.  CT spine showed no evidence of fracture.  CT head showed no acute findings. (per MD)     Clinical Impressions Pt agreeable to OT and PT co-evaluation. Pt has a  history of falls at ALF. Pt reports a little assist for ADL's at baseline. Pt required supervision assist for bed mobility and min to mod A for transfer to chair with RW. Able to ambulate to the door with more min A and labored movement. Able to doff and don a sock seated in the recliner with set up assist. Pt seems to be at level of set up for seated ADL's but more assist needed for transfers and standing tasks. Pt left in the chair with call bell within reach. Pt will benefit from continued OT in the hospital and recommended venue below to increase  strength, balance, and endurance for safe ADL's.        If plan is discharge home, recommend the following:   A lot of help with walking and/or transfers;A little help with bathing/dressing/bathroom;Assistance with cooking/housework;Assist for transportation;Help with stairs or ramp for entrance     Functional Status Assessment   Patient has had a recent decline in their functional status and demonstrates the ability to make significant improvements in function in a reasonable and predictable amount of time.     Equipment Recommendations   None recommended by OT             Precautions/Restrictions   Precautions Precautions: Fall Recall of Precautions/Restrictions: Intact Restrictions Weight Bearing Restrictions Per Provider Order: No     Mobility Bed Mobility Overal bed mobility: Needs Assistance Bed Mobility: Supine to Sit     Supine to sit: Supervision, HOB elevated, Used rails     General bed mobility comments: Use of bed rail. HOB elevated.    Transfers Overall transfer level: Needs assistance Equipment used: Rolling walker (2 wheels) Transfers: Sit to/from Stand, Bed to chair/wheelchair/BSC Sit to Stand: Mod assist     Step pivot transfers: Min assist, Mod assist     General transfer comment: PT assisting pt to chair with RW. More mod A to boost from EOB and min for steps to chair.      Balance Overall balance assessment: Needs assistance Sitting-balance support: Feet supported, No upper  extremity supported Sitting balance-Leahy Scale: Good Sitting balance - Comments: seated at EOB   Standing balance support: Reliant on assistive device for balance, During functional activity, Bilateral upper extremity supported Standing balance-Leahy Scale: Fair Standing balance comment: using RW                           ADL either performed or assessed with clinical judgement   ADL Overall ADL's : Needs assistance/impaired      Grooming: Set up;Sitting   Upper Body Bathing: Set up;Sitting   Lower Body Bathing: Set up;Sitting/lateral leans   Upper Body Dressing : Set up;Sitting   Lower Body Dressing: Set up;Sitting/lateral leans   Toilet Transfer: Minimal assistance;Moderate assistance;Stand-pivot;Ambulation;Rolling walker (2 wheels) Toilet Transfer Details (indicate cue type and reason): EOB to chair and ambulation in the room. Toileting- Clothing Manipulation and Hygiene: Set up;Sitting/lateral lean;Contact guard assist       Functional mobility during ADLs: Minimal assistance;Rolling walker (2 wheels) General ADL Comments: Pt able to ambulate to door and back with RW.     Vision Baseline Vision/History: 1 Wears glasses Ability to See in Adequate Light: 1 Impaired Patient Visual Report: No change from baseline Vision Assessment?: No apparent visual deficits     Perception Perception: Not tested       Praxis Praxis: Not tested       Pertinent Vitals/Pain Pain Assessment Pain Assessment: No/denies pain     Extremity/Trunk Assessment Upper Extremity Assessment Upper Extremity Assessment: Generalized weakness   Lower Extremity Assessment Lower Extremity Assessment: Defer to PT evaluation   Cervical / Trunk Assessment Cervical / Trunk Assessment: Normal   Communication Communication Communication: Impaired Factors Affecting Communication: Hearing impaired   Cognition Arousal: Alert Behavior During Therapy: WFL for tasks assessed/performed, Anxious Cognition: No apparent impairments                               Following commands: Intact       Cueing  General Comments   Cueing Techniques: Verbal cues;Tactile cues                 Home Living Family/patient expects to be discharged to:: Assisted living                             Home Equipment: Rollator (4 wheels);BSC/3in1;Tub bench;Wheelchair - power;Wheelchair - manual          Prior  Functioning/Environment Prior Level of Function : Needs assist       Physical Assist : ADLs (physical)   ADLs (physical): Bathing;Dressing;Toileting;IADLs Mobility Comments: Household ambulation using rollator. ADLs Comments: Pt reports a little assist for bathing, dressing, and toileting by ALF staff.    OT Problem List: Decreased strength;Decreased range of motion;Decreased activity tolerance;Impaired balance (sitting and/or standing);Cardiopulmonary status limiting activity   OT Treatment/Interventions: Self-care/ADL training;Therapeutic exercise;Energy conservation;Therapeutic activities;Patient/family education;Balance training      OT Goals(Current goals can be found in the care plan section)   Acute Rehab OT Goals Patient Stated Goal: improve function OT Goal Formulation: With patient Time For Goal Achievement: 01/28/24 Potential to Achieve Goals: Good   OT Frequency:  Min 2X/week    Co-evaluation PT/OT/SLP Co-Evaluation/Treatment: Yes Reason for Co-Treatment: To address functional/ADL transfers   OT goals addressed during session: ADL's and self-care  End of Session Equipment Utilized During Treatment: Rolling walker (2 wheels);Gait belt;Oxygen   Activity Tolerance: Patient tolerated treatment well Patient left: in chair;with call bell/phone within reach  OT Visit Diagnosis: Unsteadiness on feet (R26.81);Other abnormalities of gait and mobility (R26.89);Muscle weakness (generalized) (M62.81);Repeated falls (R29.6);History of falling (Z91.81)                Time: 9164-9145 OT Time Calculation (min): 19 min Charges:  OT General Charges $OT Visit: 1 Visit OT Evaluation $OT Eval Low Complexity: 1 Low  Wymon Swaney OT, MOT  Jayson Person 01/14/2024, 9:30 AM

## 2024-01-14 NOTE — TOC Initial Note (Signed)
 Transition of Care [facility]) - Initial/Assessment Note    Patient Details  Name: Virginia Young MRN: [id number] Date of Birth: 11-16-1940  Transition of [facility]) CM/SW Contact:    Sharlyne Stabs, RN Phone Number: 01/14/2024, 10:57 AM  Clinical Narrative:          Patient readmitted another fall at [facility]. PT is recommending SNF. CM spoke with her daughter she is agreeable. Patient came off the pain medication. She felt like this would help, but patient is still falling and weak.  FL2 completed. Her first choice is [facility]. Kristin is reviewing the referral. TOC following.         Expected Discharge Plan: Skilled Nursing Facility Barriers to Discharge: Continued Medical Work up, SNF Pending bed offer   Patient Goals and CMS Choice Patient states their goals for this hospitalization and ongoing recovery are:: Agreeable to SNF CMS Medicare.gov Compare Post Acute Care list provided to:: Patient Choice offered to / list presented to : Patient [employer] ownership interest in [facility].provided to:: Patient    Expected Discharge Plan and Services   Discharge Planning Services: CM Consult   Living arrangements for the past 2 months: Assisted Living Facility                     Prior Living Arrangements/Services Living arrangements for the past 2 months: Assisted Living Facility Lives with:: Facility Resident Patient language and need for interpreter reviewed:: Yes        Need for Family Participation in Patient Care: Yes (Comment) Care giver support system in place?: Yes (comment) Current home services: DME Criminal Activity/Legal Involvement Pertinent to Current Situation/Hospitalization: No - Comment as needed  Activities of Daily Living   ADL Screening (condition at time of admission) Independently performs ADLs?: Yes (appropriate for developmental age) Is the patient deaf or have difficulty hearing?: No Does the patient have difficulty seeing,  even when wearing glasses/contacts?: No Does the patient have difficulty concentrating, remembering, or making decisions?: No  Permission Sought/Granted            Permission granted to share info w Relationship: Daughter     Emotional Assessment     Affect (typically observed): Accepting Orientation: : Oriented to Self, Oriented to Place, Oriented to Situation Alcohol / Substance Use: Not Applicable Psych Involvement: No (comment)  Admission diagnosis:  Hypokalemia [E87.6] Fall [W19.XXXA] Hypoxia [R09.02] Fall, initial encounter [id number]] Scalp hematoma, initial encounter [S00.03XA] Patient Active Problem List   Diagnosis Date Noted   Fall 01/14/2024   Acute respiratory failure with hypoxia (HCC) 12/28/2023   Dyslipidemia 12/28/2023   Paroxysmal atrial fibrillation (HCC) 12/28/2023   Type 2 diabetes mellitus without complications (HCC) 12/28/2023   Gout 12/28/2023   CAP (community acquired pneumonia) 12/27/2023   Pneumonia 04/01/2023   Hyponatremia 04/01/2023   Elevated serum creatinine 04/01/2023   Syncope 09/16/2020   Hypokalemia 09/16/2020   Hypoalbuminemia 09/16/2020   Abrasion of right arm 09/16/2020   Right sided weakness 09/16/2020   Accidental fall 09/16/2020   Syncope and collapse 09/16/2020   Acute CVA (cerebrovascular accident) (HCC) 09/15/2020   Pyometra due to chronic inflammatory disease of uterus 02/18/2020   Postmenopausal 08/11/2019   Vitamin D  deficiency 02/17/2019   Constipation 02/17/2019   Witnessed seizure-like activity (HCC) 02/03/2019   Chronic systolic HF (heart failure) (HCC) 10/08/2018   Educated about COVID-19 virus infection 10/08/2018   Dementia (HCC) 02/10/2018   S/P shoulder replacement, right 08/22/2017  Essential hypertension 02/19/2016   Voiding dysfunction 10/07/2014   SUI (stress urinary incontinence, female) 10/07/2014   Pathological fracture due to osteoporosis 04/06/2014   DOE (dyspnea on exertion) 07/23/2013    Helicobacter positive gastritis 06/21/2013   Orthostatic hypotension 06/10/2013   Recurrent UTI 05/06/2013   Neck mass 02/19/2013   Right groin mass 11/08/2011   Low back pain 11/08/2011   Lipoma of neck 06/21/2011   OVERACTIVE BLADDER 08/21/2010   Long term current use of anticoagulant 07/25/2010   Osteoarthritis 01/18/2010   KNEE PAIN 01/18/2010   FOOT PAIN 01/18/2010   Mixed hyperlipidemia 04/20/2009   GERD 04/17/2009   MITRAL REGURGITATION, MILD 08/30/2008   NICM (nonischemic cardiomyopathy) (HCC) 08/30/2008   Depression, major, single episode, moderate (HCC) 04/10/2007   DISEASE, MITRAL VALVE NEC/NOS 04/10/2007   Atrial fibrillation (HCC) 04/10/2007   STASIS ULCER 04/10/2007   VENOUS INSUFFICIENCY, LEGS 04/10/2007   PCP:  Pia Kerney SQUIBB, MD Pharmacy:   [facility] [phone] - [place], [place] - [address] [place] [address] Phone: [phone] Fax: [phone]  [facility] [place], [place] [address] [place] [address] Phone: [phone] Fax: [phone]     Social Drivers of Health (SDOH) Social History: SDOH Screenings   Food Insecurity: No Food Insecurity (01/14/2024)  Housing: Low Risk  (01/14/2024)  Transportation Needs: No Transportation Needs (01/14/2024)  Utilities: Not At Risk (01/14/2024)  Alcohol Screen: Low Risk  (03/13/2022)  Depression (PHQ2-9): Low Risk  (03/13/2022)  Financial Resource Strain: Low Risk  (03/13/2022)  Physical Activity: Inactive (03/13/2022)  Social Connections: Moderately Isolated (01/14/2024)  Stress: No Stress Concern Present (03/13/2022)  Tobacco Use: Low Risk  (01/13/2024)   SDOH Interventions:     Readmission Risk Interventions    01/14/2024   10:55 AM 12/28/2023    7:37 PM  Readmission Risk Prevention Plan  Post Dischage Appt  Complete  Medication Screening  Complete  Transportation Screening Complete Complete  PCP or Specialist  Appt within 3-5 Days Not Complete   HRI or Home Care Consult Complete   Social Work Consult for Recovery Care Planning/Counseling Complete   Palliative Care Screening Not Applicable   Medication Review Oceanographer) Complete

## 2024-01-14 NOTE — Plan of Care (Signed)

## 2024-01-14 NOTE — [place] FL2 (Signed)
 [place]  MEDICAID FL2 LEVEL OF CARE FORM     IDENTIFICATION  Patient Name: Virginia Young Birthdate: 05-09-1941 Sex: female Admission Date (Current Location): 01/13/2024  [facility] and [place] Number:  [employer] and Address:  [facility],  [address] [place] [address]      Provider Number: [id number]  Attending Physician Name and Address:  Willette Adriana LABOR, MD  Relative Name and Phone Number:  Raguel Lango (Daughter)  [phone] (Mobile)    Current Level of Care: Hospital Recommended Level of Care: Skilled Nursing Facility Prior Approval Number:    Date Approved/Denied:   PASRR Number: [id number]  Discharge Plan: SNF    Current Diagnoses: Patient Active Problem List   Diagnosis Date Noted   Fall 01/14/2024   Acute respiratory failure with hypoxia (HCC) 12/28/2023   Dyslipidemia 12/28/2023   Paroxysmal atrial fibrillation (HCC) 12/28/2023   Type 2 diabetes mellitus without complications (HCC) 12/28/2023   Gout 12/28/2023   CAP (community acquired pneumonia) 12/27/2023   Pneumonia 04/01/2023   Hyponatremia 04/01/2023   Elevated serum creatinine 04/01/2023   Syncope 09/16/2020   Hypokalemia 09/16/2020   Hypoalbuminemia 09/16/2020   Abrasion of right arm 09/16/2020   Right sided weakness 09/16/2020   Accidental fall 09/16/2020   Syncope and collapse 09/16/2020   Acute CVA (cerebrovascular accident) (HCC) 09/15/2020   Pyometra due to chronic inflammatory disease of uterus 02/18/2020   Postmenopausal 08/11/2019   Vitamin D  deficiency 02/17/2019   Constipation 02/17/2019   Witnessed seizure-like activity (HCC) 02/03/2019   Chronic systolic HF (heart failure) (HCC) 10/08/2018   Educated about COVID-19 virus infection 10/08/2018   Dementia (HCC) 02/10/2018   S/P shoulder replacement, right 08/22/2017   Essential hypertension 02/19/2016   Voiding dysfunction 10/07/2014   SUI (stress urinary incontinence, female) 10/07/2014    Pathological fracture due to osteoporosis 04/06/2014   DOE (dyspnea on exertion) 07/23/2013   Helicobacter positive gastritis 06/21/2013   Orthostatic hypotension 06/10/2013   Recurrent UTI 05/06/2013   Neck mass 02/19/2013   Right groin mass 11/08/2011   Low back pain 11/08/2011   Lipoma of neck 06/21/2011   OVERACTIVE BLADDER 08/21/2010   Long term current use of anticoagulant 07/25/2010   Osteoarthritis 01/18/2010   KNEE PAIN 01/18/2010   FOOT PAIN 01/18/2010   Mixed hyperlipidemia 04/20/2009   GERD 04/17/2009   MITRAL REGURGITATION, MILD 08/30/2008   NICM (nonischemic cardiomyopathy) (HCC) 08/30/2008   Depression, major, single episode, moderate (HCC) 04/10/2007   DISEASE, MITRAL VALVE NEC/NOS 04/10/2007   Atrial fibrillation (HCC) 04/10/2007   STASIS ULCER 04/10/2007   VENOUS INSUFFICIENCY, LEGS 04/10/2007    Orientation RESPIRATION BLADDER Height & Weight     Self  Normal Incontinent Weight: 104 kg Height:  5' 11 (180.3 cm)  BEHAVIORAL SYMPTOMS/MOOD NEUROLOGICAL BOWEL NUTRITION STATUS      Incontinent Diet (See DC summary)  AMBULATORY STATUS COMMUNICATION OF NEEDS Skin   Extensive Assist Verbally Normal                       Personal Care Assistance Level of Assistance  Bathing, Feeding, Dressing Bathing Assistance: Maximum assistance   Dressing Assistance: Maximum assistance     Functional Limitations Info  Sight, Hearing, Speech Sight Info: Impaired Hearing Info: Impaired Speech Info: Adequate    SPECIAL CARE FACTORS FREQUENCY  PT (By licensed PT)     PT Frequency: 5 Times a week  Contractures Contractures Info: Not present    Additional Factors Info  Code Status, Allergies Code Status Info: Full Allergies Info: Bactrium, Hydrocoritsone, Lipitor, Lisinopril            Current Medications (01/14/2024):  This is the current hospital active medication list Current Facility-Administered Medications  Medication Dose Route  Frequency Provider Last Rate Last Admin   acetaminophen  (TYLENOL ) tablet 650 mg  650 mg Oral Q6H PRN Dorrell, Lamar, MD       Or   acetaminophen  (TYLENOL ) suppository 650 mg  650 mg Rectal Q6H PRN Dena Lamar, MD       apixaban  (ELIQUIS ) tablet 5 mg  5 mg Oral Q12H Dena Lamar, MD   5 mg at 01/14/24 0802   divalproex  (DEPAKOTE  ER) 24 hr tablet 250 mg  250 mg Oral Daily Dorrell, Robert, MD       DULoxetine  (CYMBALTA ) DR capsule 60 mg  60 mg Oral Daily Dorrell, Robert, MD   60 mg at 01/14/24 0802   fenofibrate  tablet 54 mg  54 mg Oral Daily Dorrell, Robert, MD       lactated ringers  1,000 mL with potassium chloride  20 mEq infusion   Intravenous Continuous Shahmehdi, Seyed A, MD       magnesium  sulfate IVPB 2 g 50 mL  2 g Intravenous Once Shahmehdi, Seyed A, MD 50 mL/hr at 01/14/24 0931 2 g at 01/14/24 0931   memantine  (NAMENDA ) tablet 5 mg  5 mg Oral BID Dena Lamar, MD   5 mg at 01/14/24 0803   metoprolol  tartrate (LOPRESSOR ) tablet 12.5 mg  12.5 mg Oral BID Dorrell, Robert, MD   12.5 mg at 01/14/24 0801   midodrine  (PROAMATINE ) tablet 2.5 mg  2.5 mg Oral TID with meals Dena Lamar, MD   2.5 mg at 01/14/24 9197   ondansetron  (ZOFRAN ) tablet 4 mg  4 mg Oral Q6H PRN Dena Lamar, MD       Or   ondansetron  (ZOFRAN ) injection 4 mg  4 mg Intravenous Q6H PRN Dorrell, Robert, MD       oxyCODONE  (Oxy IR/ROXICODONE ) immediate release tablet 5 mg  5 mg Oral Q4H PRN Dorrell, Robert, MD       pantoprazole  (PROTONIX ) EC tablet 40 mg  40 mg Oral Daily Dorrell, Robert, MD   40 mg at 01/14/24 0802   potassium chloride  10 mEq in 100 mL IVPB  10 mEq Intravenous Q1 Hr x 6 Dorrell, Robert, MD 100 mL/hr at 01/14/24 0905 10 mEq at 01/14/24 0905   QUEtiapine  (SEROQUEL ) tablet 50 mg  50 mg Oral BID Dorrell, Robert, MD   50 mg at 01/14/24 0803     Discharge Medications: Please see discharge summary for a list of discharge medications.  Relevant Imaging Results:  Relevant Lab  Results:   Additional Information SS# [id number]  Sharlyne Stabs, RN

## 2024-01-14 NOTE — Hospital Course (Signed)
 Virginia Young is a 83 y.o. female with medical history significant of A-fib on Eliquis , depression, GERD, multiple sclerosis, CHF who presenting to the department after a fall.  Patient was just admitted on 7/5, from assisted living facility when she had a fall and hit her head.  She was found to have pneumonia and completed the course of antibiotics.  PT evaluated her at that time and she was discharged back to assisted living with physical therapy.   01/13/24-  Presented to the ER due to similar findings.  She had a mechanical fall tripped and fell.  She landed on the backside of her head.  She denied any loss of consciousness or other symptoms.  She was reportedly on oxygen  during her prior presentations however was not taking at this time.   ED:  she was notably hypoxic satting in the 80s on room air.  Lab's were obtained on presentation which showed potassium 2.5, creatinine 1.3, sodium 129, WBC 7.4, hemoglobin 14.4, BNP 108, chest x-ray showed hyperinflation consistent with emphysema.  CT spine showed no evidence of fracture.  CT head showed no acute findings.   Assessment/Plan Principal Problem:   Fall    Mechanical fall Acute metabolic encephalopathy  - Patient reportedly tripped at home -Patient poor historian and slightly altered - Potassium 2.5 on presentation   Replete potassium Status post IV fluids PT evaluation   Paroxysmal A-fib-continue Eliquis    Acute hypoxic respiratory failure-patient has emphysema on imaging is likely related to COPD.  Will continue oxygen  and wean as able no evidence of infection at this time   Mood disorder-continue home Depakote , Seroquel    Depression - continue Cymbalta    Hyperlipidemia - continue fibrate   Hypertension - continue metoprolol    GERD - continue pantoprazole 

## 2024-01-14 NOTE — Plan of Care (Signed)
  Problem: Acute Rehab OT Goals (only OT should resolve) Goal: Pt. Will Perform Grooming Flowsheets (Taken 01/14/2024 0933) Pt Will Perform Grooming:  with modified independence  standing Goal: Pt. Will Perform Lower Body Dressing Flowsheets (Taken 01/14/2024 0933) Pt Will Perform Lower Body Dressing: with modified independence Goal: Pt. Will Transfer To Toilet Flowsheets (Taken 01/14/2024 [id number]) Pt Will Transfer to Toilet:  with modified independence  ambulating Goal: Pt. Will Perform Toileting-Clothing Manipulation Flowsheets (Taken 01/14/2024 0933) Pt Will Perform Toileting - Clothing Manipulation and hygiene: with modified independence Goal: Pt/Caregiver Will Perform Home Exercise Program Flowsheets (Taken 01/14/2024 [id number]) Pt/caregiver will Perform Home Exercise Program:  Increased ROM  Increased strength  Both right and left upper extremity  Independently  Isauro Skelley OT, MOT

## 2024-01-14 NOTE — H&P (Signed)
 History and Physical    Virginia Young FMW:[id number] DOB: Jan 16, 1941 DOA: 01/13/2024  PCP: Pia Kerney SQUIBB, MD   Chief Complaint:  fall  HPI: Virginia Young is a 83 y.o. female with medical history significant of A-fib on Eliquis , depression, GERD, multiple sclerosis, CHF who presenting to the department after a fall.  Patient was just admitted on 7/5.  During this presentation she was at her assisted living facility when she had a fall and hit her head.  She was found to have pneumonia and completed the course of antibiotics.  PT evaluated her at that time and she was discharged back to assisted living with physical therapy.  Today she presented to the ER due to similar findings.  She had a mechanical fall tripped and fell.  She landed on the backside of her head.  She denied any loss of consciousness or other symptoms.  She was reportedly on oxygen  during her prior presentations however was not taking at this time.  In the ER she was notably hypoxic satting in the 80s on room air.  Lab's were obtained on presentation which showed potassium 2.5, creatinine 1.3, sodium 129, WBC 7.4, hemoglobin 14.4, BNP 808, chest x-ray showed hyperinflation consistent with emphysema.  CT spine showed no evidence of fracture.  CT head showed no acute findings.   Review of Systems: Review of Systems  Constitutional: Negative.  Negative for chills and fever.  HENT: Negative.    Eyes: Negative.   Respiratory: Negative.    Cardiovascular: Negative.   Gastrointestinal: Negative.   Genitourinary:  Negative for dysuria.  Musculoskeletal: Negative.   Skin: Negative.   Neurological: Negative.   Endo/Heme/Allergies: Negative.   Psychiatric/Behavioral:  Negative for depression.   All other systems reviewed and are negative.    As per HPI otherwise 10 point review of systems negative.   Allergies  Allergen Reactions   Bactrim  [Sulfamethoxazole -Trimethoprim ]     Mouth sores   Hydrocortisone Other (See  Comments)    Unknown, no reaction listed on MAR   Lipitor [Atorvastatin  Calcium ]     myalgia   Lisinopril      cough    Past Medical History:  Diagnosis Date   Allergy     Atrial fibrillation (HCC)    coumadin    Cataract    Chest pain 03/2012   a. Lex MV 10/13:  EF 64%, dist ant and apical defect sugg of soft tissue atten, no ischemia   Chronic systolic heart failure (HCC)    Depression    GERD (gastroesophageal reflux disease)    HLD (hyperlipidemia)    Mild mitral regurgitation    MS (multiple sclerosis) (HCC)    NICM (nonischemic cardiomyopathy) (HCC)    a. neg CLite in 2003;  b. EF 40-45% in past;   c.  Echo 12/11: EF 35-40%, mild MR, mild LAE, mild RAE, small pericardial effusion    Osteoarthritis    Osteoporosis    Stasis ulcer (HCC)    Vaginal prolapse without uterine prolapse    Varicose veins     Past Surgical History:  Procedure Laterality Date   ANTERIOR AND POSTERIOR VAGINAL REPAIR W/ SACROSPINOUS LIGAMENT SUSPENSION  2016   [facility]   BLADDER SURGERY     CARPAL TUNNEL RELEASE Right 08/22/2017   Procedure: CARPAL TUNNEL RELEASE;  Surgeon: Kay Kemps, MD;  Location: [facility] OR;  Service: Orthopedics;  Laterality: Right;   cataract surgery Bilateral    EYE SURGERY     INGUINAL HERNIA REPAIR  right   LIPOMA EXCISION     h/o removed from upper back x 2   REVERSE SHOULDER ARTHROPLASTY Right 08/22/2017   Procedure: REVERSE SHOULDER ARTHROPLASTY;  Surgeon: Kay Kemps, MD;  Location: [facility] OR;  Service: Orthopedics;  Laterality: Right;     reports that she has never smoked. She has never been exposed to tobacco smoke. She has never used smokeless tobacco. She reports that she does not drink alcohol and does not use drugs.  Family History  Problem Relation Age of Onset   Diabetes Father 38       diabetes and syncope   Heart attack Father    Heart attack Sister    Heart attack Brother        all 5 brothers had MIs   Stroke Brother    Cancer Sister        bone    Diabetes Sister    Cancer Sister    Diabetes Sister    Heart attack Brother    Dementia Mother    Dementia Brother    CAD Other    Diabetes Daughter    Colon cancer Neg Hx    Neuropathy Neg Hx     Prior to Admission medications   Medication Sig Start Date End Date Taking? Authorizing Provider  acetaminophen  (TYLENOL ) 325 MG tablet Take 650 mg by mouth 3 (three) times daily. 12/11/23   [provider]  amoxicillin -clavulanate (AUGMENTIN ) 500-125 MG tablet Take 1 tablet by mouth 2 (two) times daily. 10 days supply 12/25/23   [provider]  diclofenac  Sodium (VOLTAREN ) 1 % GEL Apply 2 g topically 2 (two) times daily. 12/14/23   [provider]  divalproex  (DEPAKOTE  ER) 250 MG 24 hr tablet Take 1 tablet (250 mg total) by mouth daily. 09/23/20   Samtani, Jai-Gurmukh, MD  docusate sodium  (COLACE) 100 MG capsule Take 100 mg by mouth daily.    [provider]  DULoxetine  (CYMBALTA ) 60 MG capsule Take 60 mg by mouth daily. 12/21/23   [provider]  ELIQUIS  5 MG TABS tablet TAKE 1 TABLET BY MOUTH EVERY 12 HOURS 11/16/20   Hawks, Christy A, FNP  fenofibrate  (TRICOR ) 48 MG tablet Take 48 mg by mouth daily. 09/25/21   [provider]  folic acid  (FOLVITE ) 1 MG tablet Take 1 mg by mouth daily.    [provider]  furosemide  (LASIX ) 40 MG tablet Take 40 mg by mouth daily. 12/16/23   [provider]  ipratropium-albuterol  (DUONEB) 0.5-2.5 (3) MG/3ML SOLN Take 3 mLs by nebulization 3 (three) times daily. 04/04/23   Johnson, Clanford L, MD  memantine  (NAMENDA ) 5 MG tablet Take 5 mg by mouth 2 (two) times daily. 12/16/23   [provider]  metoprolol  tartrate (LOPRESSOR ) 25 MG tablet Take 12.5 mg by mouth 2 (two) times daily. 12/16/23   [provider]  midodrine  (PROAMATINE ) 2.5 MG tablet Take 2.5 mg by mouth 3 (three) times daily. 12/16/23   [provider]  Multiple Vitamin (MULTIVITAMIN) tablet Take 1 tablet by  mouth daily.    [provider]  NYAMYC  powder Apply 1 Application topically. 12/09/23   [provider]  omeprazole  (PRILOSEC) 40 MG capsule Take 40 mg by mouth daily. 09/07/14   [provider]  OXYGEN  Inhale into the lungs. 2 liters PRN - anytime when needed ( cardio rx'd)    [provider]  pantoprazole  (PROTONIX ) 40 MG tablet Take 40 mg by mouth 2 (two) times daily. 12/16/23  [provider]  polyethylene glycol (MIRALAX  / GLYCOLAX ) 17 g packet Take 17 g by mouth daily as needed for mild constipation. 04/04/23   Johnson, Clanford L, MD  QUEtiapine  (SEROQUEL ) 50 MG tablet Take 50 mg by mouth 2 (two) times daily. 12/16/23   [provider]  traMADol  (ULTRAM ) 50 MG tablet Take 50 mg by mouth daily as needed. 12/19/23   [provider]    Physical Exam: Vitals:   01/13/24 2104 01/13/24 2145 01/13/24 2245 01/14/24 0133  BP: (!) 105/54 102/62 118/74 (!) 140/91  Pulse: 70 67 61 88  Resp: 16 18 16 18   Temp: 97.8 F (36.6 C)   (!) 97.5 F (36.4 C)  TempSrc:    Oral  SpO2: 91% 90% 95% 100%  Weight:      Height:       Physical Exam Constitutional:      Appearance: She is normal weight.  HENT:     Head: Normocephalic.     Nose: Nose normal.     Mouth/Throat:     Mouth: Mucous membranes are moist.     Pharynx: Oropharynx is clear.  Eyes:     Conjunctiva/sclera: Conjunctivae normal.     Pupils: Pupils are equal, round, and reactive to light.  Cardiovascular:     Rate and Rhythm: Normal rate and regular rhythm.     Pulses: Normal pulses.     Heart sounds: Normal heart sounds.  Pulmonary:     Effort: Pulmonary effort is normal.     Breath sounds: Normal breath sounds.  Abdominal:     General: Abdomen is flat. Bowel sounds are normal.  Musculoskeletal:        General: Normal range of motion.     Cervical back: Normal range of motion.  Skin:    General: Skin is warm.     Capillary Refill: Capillary refill takes less than  2 seconds.  Neurological:     General: No focal deficit present.     Mental Status: She is alert.  Psychiatric:        Mood and Affect: Mood normal.       Labs on Admission: I have personally reviewed the patients's labs and imaging studies.  Assessment/Plan Principal Problem:   Fall   # Mechanical fall # Acute metabolic encephalopathy - Patient reportedly tripped at home -Patient poor historian and slightly altered - Potassium 2.5 on presentation  Plan: Replete potassium Status post IV fluids PT evaluation  # Paroxysmal A-fib-continue Eliquis   # Acute hypoxic respiratory failure-patient has emphysema on imaging is likely related to COPD.  Will continue oxygen  and wean as able no evidence of infection at this time  # Mood disorder-continue home Depakote , Seroquel   # Depression-continue Cymbalta   # Hyperlipidemia-continue fibrate  # Hypertension-continue metoprolol   # GERD-continue pantoprazole    Admission status: Inpatient Telemetry  Certification: The appropriate patient status for this patient is INPATIENT. Inpatient status is judged to be reasonable and necessary in order to provide the required intensity of service to ensure the patient's safety. The patient's presenting symptoms, physical exam findings, and initial radiographic and laboratory data in the context of their chronic comorbidities is felt to place them at high risk for further clinical deterioration. Furthermore, it is not anticipated that the patient will be medically stable for discharge from the hospital within 2 midnights of admission.   * I certify that at the point of admission it is my clinical judgment that the patient will require inpatient hospital  care spanning beyond 2 midnights from the point of admission due to high intensity of service, high risk for further deterioration and high frequency of surveillance required.Virginia Lamar Dess MD [facility] If 7PM-7AM, please contact  night-coverage [email or web address]  01/14/2024, 5:13 AM

## 2024-01-14 NOTE — ED Notes (Signed)
 ED TO INPATIENT HANDOFF REPORT  ED Nurse Name and Phone #: [place] [phone]  S Name/Age/Gender Virginia Young 83 y.o. female Room/Bed: APA10/APA10  Code Status   Code Status: Prior  Home/SNF/Other Nursing Home Patient oriented to: self, place, time, and situation Is this baseline? Yes   Triage Complete: Triage complete  Chief Complaint Fall [W19.XXXA]  Triage Note Pt came in by [facility], pt fell and self reported hitting the back of her head on the metal bedframe. No LOC. Bruising on back of the head w/ controlled bleeding.    Allergies Allergies  Allergen Reactions   Bactrim  [Sulfamethoxazole -Trimethoprim ]     Mouth sores   Hydrocortisone Other (See Comments)    Unknown, no reaction listed on MAR   Lipitor [Atorvastatin  Calcium ]     myalgia   Lisinopril      cough    Level of Care/Admitting Diagnosis ED Disposition     ED Disposition  Admit   Condition  --   Comment  Hospital Area: [facility] [[id number]]  Level of Care: Telemetry [5]  Covid Evaluation: Asymptomatic - no recent exposure (last 10 days) testing not required  Diagnosis: Fall [290176]  Admitting Physician: DENA CHARLESTON [[id number]]  Attending Physician: DENA CHARLESTON [[id number]]  Certification:: I certify this patient will need inpatient services for at least 2 midnights  Expected Medical Readiness: 01/16/2024          B Medical/Surgery History Past Medical History:  Diagnosis Date   Allergy     Atrial fibrillation (HCC)    coumadin    Cataract    Chest pain 03/2012   a. Lex MV 10/13:  EF 64%, dist ant and apical defect sugg of soft tissue atten, no ischemia   Chronic systolic heart failure (HCC)    Depression    GERD (gastroesophageal reflux disease)    HLD (hyperlipidemia)    Mild mitral regurgitation    MS (multiple sclerosis) (HCC)    NICM (nonischemic cardiomyopathy) (HCC)    a. neg CLite in 2003;  b. EF 40-45% in past;   c.  Echo 12/11: EF 35-40%, mild MR, mild LAE,  mild RAE, small pericardial effusion    Osteoarthritis    Osteoporosis    Stasis ulcer (HCC)    Vaginal prolapse without uterine prolapse    Varicose veins    Past Surgical History:  Procedure Laterality Date   ANTERIOR AND POSTERIOR VAGINAL REPAIR W/ SACROSPINOUS LIGAMENT SUSPENSION  2016   [facility]   BLADDER SURGERY     CARPAL TUNNEL RELEASE Right 08/22/2017   Procedure: CARPAL TUNNEL RELEASE;  Surgeon: Kay Kemps, MD;  Location: [facility] OR;  Service: Orthopedics;  Laterality: Right;   cataract surgery Bilateral    EYE SURGERY     INGUINAL HERNIA REPAIR     right   LIPOMA EXCISION     h/o removed from upper back x 2   REVERSE SHOULDER ARTHROPLASTY Right 08/22/2017   Procedure: REVERSE SHOULDER ARTHROPLASTY;  Surgeon: Kay Kemps, MD;  Location: [facility] OR;  Service: Orthopedics;  Laterality: Right;     A IV Location/Drains/Wounds Patient Lines/Drains/Airways Status     Active Line/Drains/Airways     Name Placement date Placement time Site Days   Peripheral IV 01/13/24 20 G 1 Right Antecubital 01/13/24  2247  Antecubital  1            Intake/Output Last 24 hours No intake or output data in the 24 hours ending 01/14/24 0041  Labs/Imaging Results for orders placed or performed  during the hospital encounter of 01/13/24 (from the past 48 hours)  Basic metabolic panel     Status: Abnormal   Collection Time: 01/13/24  9:58 PM  Result Value Ref Range   Sodium 129 (L) 135 - 145 mmol/L   Potassium 2.5 (LL) 3.5 - 5.1 mmol/L    Comment: CRITICAL RESULT CALLED TO, READ BACK BY AND VERIFIED WITH PINELL,M ON 01/13/24 AT 2233 BY LOY,C   Chloride 80 (L) 98 - 111 mmol/L   CO2 32 22 - 32 mmol/L   Glucose, Bld 133 (H) 70 - 99 mg/dL    Comment: Glucose reference range applies only to samples taken after fasting for at least 8 hours.   BUN 28 (H) 8 - 23 mg/dL   Creatinine, Ser 8.66 (H) 0.44 - 1.00 mg/dL   Calcium  8.8 (L) 8.9 - 10.3 mg/dL   GFR, Estimated 39 (L) >60 mL/min    Comment:  (NOTE) Calculated using the CKD-EPI Creatinine Equation (2021)    Anion gap 17 (H) 5 - 15    Comment: Performed at [facility], [address], [place], [place] [address]  CBC     Status: None   Collection Time: 01/13/24  9:58 PM  Result Value Ref Range   WBC 7.4 4.0 - 10.5 K/uL   RBC 5.11 3.87 - 5.11 MIL/uL   Hemoglobin 14.4 12.0 - 15.0 g/dL   HCT 56.7 63.9 - 53.9 %   MCV 84.5 80.0 - 100.0 fL   MCH 28.2 26.0 - 34.0 pg   MCHC 33.3 30.0 - 36.0 g/dL   RDW 86.2 88.4 - 84.4 %   Platelets 318 150 - 400 K/uL   nRBC 0.0 0.0 - 0.2 %    Comment: Performed at [facility], [address], [place], [place] [address]  Brain natriuretic peptide     Status: Abnormal   Collection Time: 01/13/24  9:58 PM  Result Value Ref Range   B Natriuretic Peptide 108.0 (H) 0.0 - 100.0 pg/mL    Comment: Performed at [facility], [address] [place], [place] [address]   DG Chest Portable 1 View Result Date: 01/13/2024 CLINICAL DATA:  Hypoxia EXAM: PORTABLE CHEST 1 VIEW COMPARISON:  12/27/2023 FINDINGS: Hyperinflation with emphysema. Bronchitic changes at the bases without consolidation or pleural effusion. Stable cardiomediastinal silhouette with aortic atherosclerosis. Dextroscoliosis. IMPRESSION: Hyperinflation with emphysema and bronchitic changes at the bases. Electronically Signed   By: Luke Bun M.D.   On: 01/13/2024 22:29   CT Head Wo Contrast Result Date: 01/13/2024 CLINICAL DATA:  Head and neck trauma bruising EXAM: CT HEAD WITHOUT CONTRAST CT CERVICAL SPINE WITHOUT CONTRAST TECHNIQUE: Multidetector CT imaging of the head and cervical spine was performed following the standard protocol without intravenous contrast. Multiplanar CT image reconstructions of the cervical spine were also generated. RADIATION DOSE REDUCTION: This exam was performed according to the departmental dose-optimization program which includes automated exposure control, adjustment of the mA and/or kV according to patient size  and/or use of iterative reconstruction technique. COMPARISON:  CT brain and cervical spine 12/27/2023 FINDINGS: CT HEAD FINDINGS Brain: No acute territorial infarction, hemorrhage or intracranial mass. Moderate severe atrophy. Moderate chronic small vessel ischemic changes of the white matter. Stable ventricle size Vascular: No hyperdense vessels.  Carotid vascular calcification Skull: Normal. Negative for fracture or focal lesion. Sinuses/Orbits: No acute finding. Other: Small right posterior scalp hematoma CT CERVICAL SPINE FINDINGS Alignment: Trace anterolisthesis C3 on C4, C4 on C5 and C5 on C6. Facet alignment is  normal Skull base and vertebrae: No acute fracture. No primary bone lesion or focal pathologic process. Soft tissues and spinal canal: No prevertebral fluid or swelling. No visible canal hematoma. Disc levels: Multilevel degenerative change. Advanced disc space narrowing C5-C6, C6-C7 and C7-T1. Hypertrophic facet degenerative changes at multiple levels with foraminal narrowing. Upper chest: Negative. Other: None IMPRESSION: 1. No CT evidence for acute intracranial abnormality. Atrophy and chronic small vessel ischemic changes of the white matter. 2. Degenerative changes of the cervical spine. No acute osseous abnormality. Electronically Signed   By: Luke Bun M.D.   On: 01/13/2024 21:38   CT Cervical Spine Wo Contrast Result Date: 01/13/2024 CLINICAL DATA:  Head and neck trauma bruising EXAM: CT HEAD WITHOUT CONTRAST CT CERVICAL SPINE WITHOUT CONTRAST TECHNIQUE: Multidetector CT imaging of the head and cervical spine was performed following the standard protocol without intravenous contrast. Multiplanar CT image reconstructions of the cervical spine were also generated. RADIATION DOSE REDUCTION: This exam was performed according to the departmental dose-optimization program which includes automated exposure control, adjustment of the mA and/or kV according to patient size and/or use of iterative  reconstruction technique. COMPARISON:  CT brain and cervical spine 12/27/2023 FINDINGS: CT HEAD FINDINGS Brain: No acute territorial infarction, hemorrhage or intracranial mass. Moderate severe atrophy. Moderate chronic small vessel ischemic changes of the white matter. Stable ventricle size Vascular: No hyperdense vessels.  Carotid vascular calcification Skull: Normal. Negative for fracture or focal lesion. Sinuses/Orbits: No acute finding. Other: Small right posterior scalp hematoma CT CERVICAL SPINE FINDINGS Alignment: Trace anterolisthesis C3 on C4, C4 on C5 and C5 on C6. Facet alignment is normal Skull base and vertebrae: No acute fracture. No primary bone lesion or focal pathologic process. Soft tissues and spinal canal: No prevertebral fluid or swelling. No visible canal hematoma. Disc levels: Multilevel degenerative change. Advanced disc space narrowing C5-C6, C6-C7 and C7-T1. Hypertrophic facet degenerative changes at multiple levels with foraminal narrowing. Upper chest: Negative. Other: None IMPRESSION: 1. No CT evidence for acute intracranial abnormality. Atrophy and chronic small vessel ischemic changes of the white matter. 2. Degenerative changes of the cervical spine. No acute osseous abnormality. Electronically Signed   By: Luke Bun M.D.   On: 01/13/2024 21:38    Pending Labs Unresulted Labs (From admission, onward)    None       Vitals/Pain Today's Vitals   01/13/24 2059 01/13/24 2104 01/13/24 2145 01/13/24 2245  BP:  (!) 105/54 102/62 118/74  Pulse:  70 67 61  Resp:  16 18 16   Temp:  97.8 F (36.6 C)    SpO2:  91% 90% 95%  Weight: 119.3 kg     Height: 5' 11 (1.803 m)     PainSc:        Isolation Precautions No active isolations  Medications Medications  potassium chloride  10 mEq in 100 mL IVPB (10 mEq Intravenous New Bag/Given 01/13/24 2355)  ipratropium-albuterol  (DUONEB) 0.5-2.5 (3) MG/3ML nebulizer solution 3 mL (3 mLs Nebulization Given 01/13/24 2335)   lactated ringers  bolus 1,000 mL (1,000 mLs Intravenous New Bag/Given 01/13/24 2357)    Mobility walks with device     Focused Assessments    R Recommendations: See Admitting Provider Note  Report given to:   Additional Notes:

## 2024-01-14 NOTE — Progress Notes (Signed)
 PROGRESS NOTE    Patient: Virginia Young                            PCP: Pia Kerney SQUIBB, MD                    DOB: 10/09/1940            DOA: 01/13/2024 FMW:[id number]             DOS: 01/14/2024, 10:14 AM   LOS: 0 days   Date of Service: The patient was seen and examined on 01/14/2024  Subjective:   The patient was seen and examined this morning. Hemodynamically stable.  Satting 93% on 2 L of oxygen   No issues overnight .  Brief Narrative:   Virginia Young is a 83 y.o. female with medical history significant of A-fib on Eliquis , depression, GERD, multiple sclerosis, CHF who presenting to the department after a fall.  Patient was just admitted on 7/5, from assisted living facility when she had a fall and hit her head.  She was found to have pneumonia and completed the course of antibiotics.  PT evaluated her at that time and she was discharged back to assisted living with physical therapy.   01/13/24-  Presented to the ER due to similar findings.  She had a mechanical fall tripped and fell.  She landed on the backside of her head.  She denied any loss of consciousness or other symptoms.  She was reportedly on oxygen  during her prior presentations however was not taking at this time.   ED:  she was notably hypoxic satting in the 80s on room air.  Lab's were obtained on presentation which showed potassium 2.5, creatinine 1.3, sodium 129, WBC 7.4, hemoglobin 14.4, BNP 108, chest x-ray showed hyperinflation consistent with emphysema.  CT spine showed no evidence of fracture.  CT head showed no acute findings.   Assessment/Plan Principal Problem:   Falls    Mechanical falls - Recurrent falls-denied passing out, blacking out, no report of shaking or bowel or urine incontinence with these episodes  Acute metabolic encephalopathy -Per patient legs are giving out,  - Potassium 2.5 on presentation  Hypokalemia Replete potassium Status post IV fluids PT evaluation   Paroxysmal  A-fib-continue Eliquis    Acute hypoxic respiratory failure - Continue 2 L of oxygen , satting 93% -patient has emphysema on imaging is likely related to COPD.   - Will continue oxygen  and wean as able no evidence of infection at this time  -DuoNeb as needed   Mood disorder-continue home Depakote , Seroquel    Depression - continue Cymbalta    Hyperlipidemia - continue fibrate   Hypertension - continue metoprolol    GERD - continue pantoprazole   According to the beers criteria patient is on multiple medication that may cause her to fall Contributing to hyponatremia, hypokalemia-will try to minimize and reduce dose of blood meds  ----------------------------------------------------------------------------------------------------------------------------------------------- Nutritional status:  The patient's BMI is: Body mass index is 31.98 kg/m. I agree with the assessment and plan as outlined    ---------------------------------------------------------------------------------------------------------------------------------  DVT prophylaxis:  SCDs Start: 01/14/24 0511 apixaban  (ELIQUIS ) tablet 5 mg   Code Status:   Code Status: Full Code  Family Communication: No family member present at bedside-  -Advance care planning has been discussed.   Admission status:   Status is: Inpatient Remains inpatient appropriate because: Needing IV fluids   Disposition: From  - ALF  Planning for discharge in 1-2 days -- to SNF   Procedures:   No admission procedures for hospital encounter.   Antimicrobials:  Anti-infectives (From admission, onward)    None        Medication:   apixaban   5 mg Oral Q12H   divalproex   250 mg Oral Daily   DULoxetine   60 mg Oral Daily   fenofibrate   54 mg Oral Daily   memantine   5 mg Oral BID   metoprolol  tartrate  12.5 mg Oral BID   midodrine   2.5 mg Oral TID with meals   pantoprazole   40 mg Oral Daily   QUEtiapine   50 mg Oral BID     acetaminophen  **OR** acetaminophen , ondansetron  **OR** ondansetron  (ZOFRAN ) IV, oxyCODONE    Objective:   Vitals:   01/13/24 2145 01/13/24 2245 01/14/24 0133 01/14/24 0525  BP: 102/62 118/74 (!) 140/91 (!) 123/41  Pulse: 67 61 88 70  Resp: 18 16 18    Temp:   (!) 97.5 F (36.4 C) 97.6 F (36.4 C)  TempSrc:   Oral Oral  SpO2: 90% 95% 100% 93%  Weight:   104 kg   Height:   5' 11 (1.803 m)     Intake/Output Summary (Last 24 hours) at 01/14/2024 1014 Last data filed at 01/14/2024 0800 Gross per 24 hour  Intake 1541.41 ml  Output --  Net 1541.41 ml   Filed Weights   01/13/24 2059 01/14/24 0133  Weight: 119.3 kg 104 kg     Physical examination:   General:  AAO x 3,  cooperative, no distress;   HEENT:  Normocephalic, PERRL, otherwise with in Normal limits   Neuro:  CNII-XII intact. , normal motor and sensation, reflexes intact   Lungs:   Clear to auscultation BL, Respirations unlabored,  No wheezes / crackles  Cardio:    S1/S2, RRR, No murmure, No Rubs or Gallops   Abdomen:  Soft, non-tender, bowel sounds active all four quadrants, no guarding or peritoneal signs.  Muscular  skeletal:  Limited exam -global generalized weaknesses - in bed, able to move all 4 extremities,   2+ pulses,  symmetric, No pitting edema  Skin:  Dry, warm to touch, negative for any Rashes,  Wounds: Please see nursing documentation       ------------------------------------------------------------------------------------------------------------------------------------------    LABs:     Latest Ref Rng & Units 01/13/2024    9:58 PM 12/29/2023    4:25 AM 12/28/2023    4:10 AM  CBC  WBC 4.0 - 10.5 K/uL 7.4  9.0  8.4   Hemoglobin 12.0 - 15.0 g/dL 85.5  86.5  87.2   Hematocrit 36.0 - 46.0 % 43.2  40.7  39.9   Platelets 150 - 400 K/uL 318  279  257       Latest Ref Rng & Units 01/14/2024    8:09 AM 01/13/2024    9:58 PM 12/29/2023    4:25 AM  CMP  Glucose 70 - 99 mg/dL 852  866  893   BUN  8 - 23 mg/dL 22  28  8    Creatinine 0.44 - 1.00 mg/dL 8.87  8.66  9.28   Sodium 135 - 145 mmol/L 129  129  134   Potassium 3.5 - 5.1 mmol/L 3.0  2.5  4.2   Chloride 98 - 111 mmol/L 81  80  98   CO2 22 - 32 mmol/L 37  32  26   Calcium  8.9 - 10.3 mg/dL 8.6  8.8  8.8  Micro Results No results found for this or any previous visit (from the past 240 hours).  Radiology Reports DG Chest Portable 1 View Result Date: 01/13/2024 CLINICAL DATA:  Hypoxia EXAM: PORTABLE CHEST 1 VIEW COMPARISON:  12/27/2023 FINDINGS: Hyperinflation with emphysema. Bronchitic changes at the bases without consolidation or pleural effusion. Stable cardiomediastinal silhouette with aortic atherosclerosis. Dextroscoliosis. IMPRESSION: Hyperinflation with emphysema and bronchitic changes at the bases. Electronically Signed   By: Luke Bun M.D.   On: 01/13/2024 22:29   CT Head Wo Contrast Result Date: 01/13/2024 CLINICAL DATA:  Head and neck trauma bruising EXAM: CT HEAD WITHOUT CONTRAST CT CERVICAL SPINE WITHOUT CONTRAST TECHNIQUE: Multidetector CT imaging of the head and cervical spine was performed following the standard protocol without intravenous contrast. Multiplanar CT image reconstructions of the cervical spine were also generated. RADIATION DOSE REDUCTION: This exam was performed according to the departmental dose-optimization program which includes automated exposure control, adjustment of the mA and/or kV according to patient size and/or use of iterative reconstruction technique. COMPARISON:  CT brain and cervical spine 12/27/2023 FINDINGS: CT HEAD FINDINGS Brain: No acute territorial infarction, hemorrhage or intracranial mass. Moderate severe atrophy. Moderate chronic small vessel ischemic changes of the white matter. Stable ventricle size Vascular: No hyperdense vessels.  Carotid vascular calcification Skull: Normal. Negative for fracture or focal lesion. Sinuses/Orbits: No acute finding. Other: Small right  posterior scalp hematoma CT CERVICAL SPINE FINDINGS Alignment: Trace anterolisthesis C3 on C4, C4 on C5 and C5 on C6. Facet alignment is normal Skull base and vertebrae: No acute fracture. No primary bone lesion or focal pathologic process. Soft tissues and spinal canal: No prevertebral fluid or swelling. No visible canal hematoma. Disc levels: Multilevel degenerative change. Advanced disc space narrowing C5-C6, C6-C7 and C7-T1. Hypertrophic facet degenerative changes at multiple levels with foraminal narrowing. Upper chest: Negative. Other: None IMPRESSION: 1. No CT evidence for acute intracranial abnormality. Atrophy and chronic small vessel ischemic changes of the white matter. 2. Degenerative changes of the cervical spine. No acute osseous abnormality. Electronically Signed   By: Luke Bun M.D.   On: 01/13/2024 21:38   CT Cervical Spine Wo Contrast Result Date: 01/13/2024 CLINICAL DATA:  Head and neck trauma bruising EXAM: CT HEAD WITHOUT CONTRAST CT CERVICAL SPINE WITHOUT CONTRAST TECHNIQUE: Multidetector CT imaging of the head and cervical spine was performed following the standard protocol without intravenous contrast. Multiplanar CT image reconstructions of the cervical spine were also generated. RADIATION DOSE REDUCTION: This exam was performed according to the departmental dose-optimization program which includes automated exposure control, adjustment of the mA and/or kV according to patient size and/or use of iterative reconstruction technique. COMPARISON:  CT brain and cervical spine 12/27/2023 FINDINGS: CT HEAD FINDINGS Brain: No acute territorial infarction, hemorrhage or intracranial mass. Moderate severe atrophy. Moderate chronic small vessel ischemic changes of the white matter. Stable ventricle size Vascular: No hyperdense vessels.  Carotid vascular calcification Skull: Normal. Negative for fracture or focal lesion. Sinuses/Orbits: No acute finding. Other: Small right posterior scalp  hematoma CT CERVICAL SPINE FINDINGS Alignment: Trace anterolisthesis C3 on C4, C4 on C5 and C5 on C6. Facet alignment is normal Skull base and vertebrae: No acute fracture. No primary bone lesion or focal pathologic process. Soft tissues and spinal canal: No prevertebral fluid or swelling. No visible canal hematoma. Disc levels: Multilevel degenerative change. Advanced disc space narrowing C5-C6, C6-C7 and C7-T1. Hypertrophic facet degenerative changes at multiple levels with foraminal narrowing. Upper chest: Negative. Other: None IMPRESSION: 1. No CT  evidence for acute intracranial abnormality. Atrophy and chronic small vessel ischemic changes of the white matter. 2. Degenerative changes of the cervical spine. No acute osseous abnormality. Electronically Signed   By: Luke Bun M.D.   On: 01/13/2024 21:38    SIGNED: Adriana DELENA Grams, MD, FHM. FAAFP. [facility]ol[facility] - [facility] Time spent - 55 min.  In seeing, evaluating and examining the patient. Reviewing medical records, labs, drawn plan of care. [facility],  Pager (please use [email or web address] to page/ text) Please use Epic Secure Chat for non-urgent communication (7AM-7PM)  If 7PM-7AM, please contact night-coverage [email or web address], 01/14/2024, 10:14 AM

## 2024-01-14 NOTE — Evaluation (Signed)
 Physical Therapy Evaluation Patient Details Name: Virginia Young MRN: [id number] DOB: 18-Feb-1941 Today's Date: 01/14/2024  History of Present Illness  Virginia Young is a 83 y.o. female with medical history significant of A-fib on Eliquis , depression, GERD, multiple sclerosis, CHF who presenting to the department after a fall.  Patient was just admitted on 7/5.  During this presentation she was at her assisted living facility when she had a fall and hit her head.  She was found to have pneumonia and completed the course of antibiotics.  PT evaluated her at that time and she was discharged back to assisted living with physical therapy.  Today she presented to the ER due to similar findings.  She had a mechanical fall tripped and fell.  She landed on the backside of her head.  She denied any loss of consciousness or other symptoms.  She was reportedly on oxygen  during her prior presentations however was not taking at this time.  In the ER she was notably hypoxic satting in the 80s on room air.  Lab's were obtained on presentation which showed potassium 2.5, creatinine 1.3, sodium 129, WBC 7.4, hemoglobin 14.4, BNP 808, chest x-ray showed hyperinflation consistent with emphysema.  CT spine showed no evidence of fracture.  CT head showed no acute findings.   Clinical Impression  Patient presents for today's evaluation with supplemental O2 off with a saturation of 99%. Patient demonstrates slow labored movement for sitting up at bedside and ambulation in the room with fair/good balance and is limited mainly by fatigue. Patient O2 saturation 88% following exertion and given supplemental oxygen  at 2 LPM. She tolerated sitting in the chair at the end of the session- RN notified of status. Patient will benefit from continued skilled physical therapy in hospital and recommended venue below to increase strength, balance, endurance for safe ADLs and gait.       If plan is discharge home, recommend the  following: Help with stairs or ramp for entrance;Assistance with cooking/housework;A little help with bathing/dressing/bathroom;A little help with walking and/or transfers   Can travel by private vehicle        Equipment Recommendations None recommended by PT  Recommendations for Other Services       Functional Status Assessment Patient has had a recent decline in their functional status and demonstrates the ability to make significant improvements in function in a reasonable and predictable amount of time.     Precautions / Restrictions Precautions Precautions: Fall Recall of Precautions/Restrictions: Intact Restrictions Weight Bearing Restrictions Per Provider Order: No      Mobility  Bed Mobility Overal bed mobility: Needs Assistance Bed Mobility: Supine to Sit     Supine to sit: Supervision, HOB elevated, Used rails     General bed mobility comments: Use of bed rail. HOB elevated.    Transfers Overall transfer level: Needs assistance Equipment used: Rolling walker (2 wheels) Transfers: Sit to/from Stand, Bed to chair/wheelchair/BSC Sit to Stand: Mod assist   Step pivot transfers: Min assist       General transfer comment: PT assisting pt to chair with RW. More mod A to boost from EOB and min for steps to chair.    Ambulation/Gait Ambulation/Gait assistance: Min assist Gait Distance (Feet): 15 Feet Assistive device: Rolling walker (2 wheels) Gait Pattern/deviations: Decreased step length - left, Decreased stance time - right, Decreased stride length Gait velocity: decreased     General Gait Details: slow labored movement without loss of balance, limited mostly due to c/o  fatigue, generalized weakness  Stairs            Wheelchair Mobility     Tilt Bed    Modified Rankin (Stroke Patients Only)       Balance Overall balance assessment: Needs assistance Sitting-balance support: Feet supported, No upper extremity supported Sitting  balance-Leahy Scale: Good Sitting balance - Comments: seated at EOB   Standing balance support: Reliant on assistive device for balance, During functional activity, Bilateral upper extremity supported Standing balance-Leahy Scale: Fair Standing balance comment: using RW                             Pertinent Vitals/Pain Pain Assessment Pain Assessment: No/denies pain    Home Living Family/patient expects to be discharged to:: Assisted living                 Home Equipment: Rollator (4 wheels);BSC/3in1;Tub bench;Wheelchair - power;Wheelchair - manual      Prior Function Prior Level of Function : Needs assist       Physical Assist : Mobility (physical) Mobility (physical): Bed mobility;Transfers;Gait;Stairs ADLs (physical): Bathing;Dressing;Toileting;IADLs Mobility Comments: Household ambulation using rollator. ADLs Comments: Pt reports a little assist for bathing, dressing, and toileting by ALF staff.     Extremity/Trunk Assessment   Upper Extremity Assessment Upper Extremity Assessment: Defer to OT evaluation    Lower Extremity Assessment Lower Extremity Assessment: Generalized weakness    Cervical / Trunk Assessment Cervical / Trunk Assessment: Normal  Communication   Communication Communication: Impaired Factors Affecting Communication: Hearing impaired    Cognition Arousal: Alert Behavior During Therapy: WFL for tasks assessed/performed, Anxious   PT - Cognitive impairments: No apparent impairments                         Following commands: Intact       Cueing Cueing Techniques: Verbal cues, Tactile cues     General Comments      Exercises     Assessment/Plan    PT Assessment Patient needs continued PT services  PT Problem List Decreased strength;Decreased activity tolerance;Decreased balance;Decreased mobility       PT Treatment Interventions DME instruction;Gait training;Stair training;Functional mobility  training;Therapeutic activities;Balance training;Therapeutic exercise;Patient/family education    PT Goals (Current goals can be found in the Care Plan section)  Acute Rehab PT Goals Patient Stated Goal: return home with ALF staff to assist PT Goal Formulation: With patient Time For Goal Achievement: 01/28/24 Potential to Achieve Goals: Good    Frequency Min 3X/week     Co-evaluation PT/OT/SLP Co-Evaluation/Treatment: Yes Reason for Co-Treatment: To address functional/ADL transfers PT goals addressed during session: Mobility/safety with mobility;Balance;Proper use of DME         AM-PAC PT 6 Clicks Mobility  Outcome Measure Help needed turning from your back to your side while in a flat bed without using bedrails?: None Help needed moving from lying on your back to sitting on the side of a flat bed without using bedrails?: A Little Help needed moving to and from a bed to a chair (including a wheelchair)?: A Little Help needed standing up from a chair using your arms (e.g., wheelchair or bedside chair)?: A Little Help needed to walk in hospital room?: A Lot Help needed climbing 3-5 steps with a railing? : A Lot 6 Click Score: 17    End of Session Equipment Utilized During Treatment: Gait belt Activity Tolerance: Patient tolerated treatment well;Patient  limited by fatigue Patient left: in chair;with call bell/phone within reach Nurse Communication: Mobility status PT Visit Diagnosis: Unsteadiness on feet (R26.81);Other abnormalities of gait and mobility (R26.89);Muscle weakness (generalized) (M62.81)    Time: 9165-9141 PT Time Calculation (min) (ACUTE ONLY): 24 min   Charges:   PT Evaluation $PT Eval Moderate Complexity: 1 Mod PT Treatments $Therapeutic Activity: 23-37 mins PT General Charges $$ ACUTE PT VISIT: 1 Visit        2:32 PM, 01/14/24,  Onnie Como, SPT

## 2024-01-15 DIAGNOSIS — G9341 Metabolic encephalopathy: Secondary | ICD-10-CM | POA: Diagnosis not present

## 2024-01-15 DIAGNOSIS — W19XXXD Unspecified fall, subsequent encounter: Secondary | ICD-10-CM | POA: Diagnosis not present

## 2024-01-15 LAB — COMPREHENSIVE METABOLIC PANEL WITH GFR
ALT: 11 U/L (ref 0–44)
AST: 19 U/L (ref 15–41)
Albumin: 2.8 g/dL — ABNORMAL LOW (ref 3.5–5.0)
Alkaline Phosphatase: 173 U/L — ABNORMAL HIGH (ref 38–126)
Anion gap: 10 (ref 5–15)
BUN: 19 mg/dL (ref 8–23)
CO2: 32 mmol/L (ref 22–32)
Calcium: 8.4 mg/dL — ABNORMAL LOW (ref 8.9–10.3)
Chloride: 87 mmol/L — ABNORMAL LOW (ref 98–111)
Creatinine, Ser: 0.94 mg/dL (ref 0.44–1.00)
GFR, Estimated: 60 mL/min — ABNORMAL LOW (ref >60)
Glucose, Bld: 113 mg/dL — ABNORMAL HIGH (ref 70–99)
Potassium: 3.3 mmol/L — ABNORMAL LOW (ref 3.5–5.1)
Sodium: 129 mmol/L — ABNORMAL LOW (ref 135–145)
Total Bilirubin: 0.8 mg/dL (ref 0.0–1.2)
Total Protein: 5.8 g/dL — ABNORMAL LOW (ref 6.5–8.1)

## 2024-01-15 LAB — BRAIN NATRIURETIC PEPTIDE: B Natriuretic Peptide: 150 pg/mL — ABNORMAL HIGH (ref 0.0–100.0)

## 2024-01-15 MED ORDER — PANTOPRAZOLE SODIUM 40 MG PO TBEC: 40.0000 mg | DELAYED_RELEASE_TABLET | Freq: Every day | ORAL | Status: DC

## 2024-01-15 MED ORDER — POTASSIUM CHLORIDE CRYS ER 20 MEQ PO TBCR
40.0000 meq | EXTENDED_RELEASE_TABLET | Freq: Once | ORAL | Status: AC
  Administered 2024-01-15: 40 meq via ORAL
  Filled 2024-01-15: qty 2

## 2024-01-15 MED ORDER — SODIUM CHLORIDE 1 G PO TABS
1.0000 g | ORAL_TABLET | Freq: Two times a day (BID) | ORAL | Status: DC
  Administered 2024-01-15 (×2): 1 g via ORAL
  Filled 2024-01-15 (×2): qty 1

## 2024-01-15 MED ORDER — NYSTATIN 100000 UNIT/GM EX OINT
TOPICAL_OINTMENT | Freq: Two times a day (BID) | CUTANEOUS | Status: DC
  Administered 2024-01-15: 1 via TOPICAL
  Filled 2024-01-15 (×2): qty 15

## 2024-01-15 NOTE — TOC Progression Note (Signed)
 Transition of Care [facility]) - Progression Note    Patient Details  Name: Virginia Young MRN: [id number] Date of Birth: [birth year]  [facility] CM/SW Contact  Sharlyne Stabs, RN Phone Number: 01/15/2024, 9:51 AM  Clinical Narrative:   [facility] offered, Daughter accepted. Patient is not medically ready to discharge today, low sodium level and on 2L oxygen . Daughter and Josette at [facility] side update. DC planning for tomorrow. Daughter, Holley, will sign consent at [facility] today.     Expected Discharge Plan: Skilled Nursing Facility Barriers to Discharge: Continued Medical Work up, SNF Pending bed offer      Expected Discharge Plan and Services   Discharge Planning Services: CM Consult   Living arrangements for the past 2 months: Assisted Living Facility    Social Drivers of Health (SDOH) Interventions SDOH Screenings   Food Insecurity: No Food Insecurity (01/14/2024)  Housing: Low Risk  (01/14/2024)  Transportation Needs: No Transportation Needs (01/14/2024)  Utilities: Not At Risk (01/14/2024)  Alcohol Screen: Low Risk  (03/13/2022)  Depression (PHQ2-9): Low Risk  (03/13/2022)  Financial Resource Strain: Low Risk  (03/13/2022)  Physical Activity: Inactive (03/13/2022)  Social Connections: Moderately Isolated (01/14/2024)  Stress: No Stress Concern Present (03/13/2022)  Tobacco Use: Low Risk  (01/13/2024)    Readmission Risk Interventions    01/14/2024   10:55 AM 12/28/2023    7:37 PM  Readmission Risk Prevention Plan  Post Dischage Appt  Complete  Medication Screening  Complete  Transportation Screening Complete Complete  PCP or Specialist Appt within 3-5 Days Not Complete   HRI or Home Care Consult Complete   Social Work Consult for Recovery Care Planning/Counseling Complete   Palliative Care Screening Not Applicable   Medication Review Oceanographer) Complete

## 2024-01-15 NOTE — Progress Notes (Signed)
   01/14/24 1942  Assess: MEWS Score  Temp 97.6 F (36.4 C)  BP 133/77  MAP (mmHg) 94  Pulse Rate 74  ECG Heart Rate (!) 0  Resp 18  SpO2 98 %  O2 Device Nasal Cannula  O2 Flow Rate (L/min) 2 L/min  Assess: MEWS Score  MEWS Temp 0  MEWS Systolic 0  MEWS Pulse 2  MEWS RR 0  MEWS LOC 0  MEWS Score 2  MEWS Score Color Yellow  Assess: if the MEWS score is Yellow or Red  Were vital signs accurate and taken at a resting state? Yes  Does the patient meet 2 or more of the SIRS criteria? No  MEWS guidelines implemented  No, previously yellow, continue vital signs every 4 hours  Notify: Charge Nurse/RN  Name of Charge Nurse/RN Notified Patterson, RN  Assess: SIRS CRITERIA  SIRS Temperature  0  SIRS Respirations  0  SIRS Pulse 0  SIRS WBC 0  SIRS Score Sum  0   Pt's ECG Heart Rate is what caused her to turn yellow, although she was not asystole.

## 2024-01-15 NOTE — Progress Notes (Signed)
 PROGRESS NOTE    Patient: Virginia Young                            PCP: Pia Kerney SQUIBB, MD                    DOB: May 14, 1941            DOA: 01/13/2024 FMW:[id number]             DOS: 01/15/2024, 11:52 AM   LOS: 1 day   Date of Service: The patient was seen and examined on 01/15/2024  Subjective:   The patient was seen and examined this morning, stable laying in bed, comfortable awake alert Still requiring 2 L of oxygen , satting 96% Complains of generalized weakness point, denies any muscle cramping.  Noted this morning for sodium of 129, potassium 3.3 Tolerating p.o.   Brief Narrative:   Virginia Young is a 83 y.o. female with medical history significant of A-fib on Eliquis , depression, GERD, multiple sclerosis, CHF who presenting to the department after a fall.  Patient was just admitted on 7/5, from assisted living facility when she had a fall and hit her head.  She was found to have pneumonia and completed the course of antibiotics.  PT evaluated her at that time and she was discharged back to assisted living with physical therapy.   01/13/24-  Presented to the ER due to similar findings.  She had a mechanical fall tripped and fell.  She landed on the backside of her head.  She denied any loss of consciousness or other symptoms.  She was reportedly on oxygen  during her prior presentations however was not taking at this time.   ED:  she was notably hypoxic satting in the 80s on room air.  Lab's were obtained on presentation which showed potassium 2.5, creatinine 1.3, sodium 129, WBC 7.4, hemoglobin 14.4, BNP 108, chest x-ray showed hyperinflation consistent with emphysema.  CT spine showed no evidence of fracture.  CT head showed no acute findings.   Assessment/Plan Principal Problem: Recurrent falls    Mechanical falls -recurrent - Recurrent falls-denied passing out, blacking out, no report of shaking or bowel or urine incontinence with these episodes  -Consulted PT OT,  recommending SNF Fall precautions   Acute metabolic encephalopathy -Improved mentation (may be likely due to dehydration, electrolyte normality) -Per patient legs are giving out,  - Potassium 2.5 on presentation-  Hypokalemia -Persistent hypokalemia, 2.5, 3.0, 3.3-repleted -S/p IVF -Monitoring, checking magnesium  level   Paroxysmal A-fib-continue Eliquis    Acute hypoxic respiratory failure -Continues to improve but still requiring 2 L of oxygen , satting 96% -patient has emphysema on imaging is likely related to COPD.   - Will continue oxygen  and wean as able no evidence of infection at this time  -DuoNeb as needed   Mood disorder-continue home Depakote , Seroquel    Depression - continue Cymbalta    Hyperlipidemia - continue fibrate   Hypertension - continue metoprolol    GERD - continue pantoprazole   According to the beers criteria patient is on multiple medication that may cause her to fall Contributing to hyponatremia, hypokalemia-will try to minimize and reduce dose of blood meds  ----------------------------------------------------------------------------------------------------------------------------------------------- Nutritional status:  The patient's BMI is: Body mass index is 31.98 kg/m. I agree with the assessment and plan as outlined    ---------------------------------------------------------------------------------------------------------------------------------  DVT prophylaxis:  SCDs Start: 01/14/24 0511 apixaban  (ELIQUIS ) tablet 5 mg   Code Status:  Code Status: Full Code  Family Communication: No family member present at bedside-  -Advance care planning has been discussed.   Admission status:   Status is: Inpatient Remains inpatient appropriate because: Needing IV fluids   Disposition: From  - ALF            Planning for discharge in 1 days -- to SNF   Procedures:   No admission procedures for hospital encounter.   Antimicrobials:   Anti-infectives (From admission, onward)    None        Medication:   apixaban   5 mg Oral Q12H   divalproex   250 mg Oral Daily   docusate sodium   100 mg Oral Daily   DULoxetine   30 mg Oral Daily   fenofibrate   54 mg Oral Daily   memantine   5 mg Oral BID   metoprolol  tartrate  12.5 mg Oral BID   midodrine   2.5 mg Oral TID with meals   nystatin  ointment   Topical BID   [START ON 01/16/2024] pantoprazole   40 mg Oral Daily   potassium chloride   40 mEq Oral Once   QUEtiapine   50 mg Oral BID   sodium chloride   1 g Oral BID WC    acetaminophen  **OR** acetaminophen , ondansetron  **OR** ondansetron  (ZOFRAN ) IV, oxyCODONE    Objective:   Vitals:   01/14/24 1942 01/14/24 2127 01/14/24 2259 01/15/24 0409  BP: 133/77 (!) 153/66 (!) 153/66 (!) 143/64  Pulse: 74 77 77 60  Resp: 18     Temp: 97.6 F (36.4 C) 97.8 F (36.6 C)  (!) 97.3 F (36.3 C)  TempSrc: Oral Oral  Oral  SpO2: 98% 98%  96%  Weight:      Height:        Intake/Output Summary (Last 24 hours) at 01/15/2024 1152 Last data filed at 01/15/2024 0800 Gross per 24 hour  Intake 1200 ml  Output --  Net 1200 ml   Filed Weights   01/13/24 2059 01/14/24 0133  Weight: 119.3 kg 104 kg     Physical examination:    General:  AAO x 3,  cooperative, no distress;   HEENT:  Normocephalic, PERRL, otherwise with in Normal limits   Neuro:  CNII-XII intact. , normal motor and sensation, reflexes intact   Lungs:   Clear to auscultation BL, Respirations unlabored,  No wheezes / crackles  Cardio:    S1/S2, RRR, No murmure, No Rubs or Gallops   Abdomen:  Soft, non-tender, bowel sounds active all four quadrants, no guarding or peritoneal signs.  Muscular  skeletal:  Limited exam -global generalized weaknesses - in bed, able to move all 4 extremities,   2+ pulses,  symmetric, No pitting edema  Skin:  Dry, warm to touch, negative for any Rashes,  Wounds: Please see nursing documentation           ------------------------------------------------------------------------------------------------------------------------------------------    LABs:     Latest Ref Rng & Units 01/13/2024    9:58 PM 12/29/2023    4:25 AM 12/28/2023    4:10 AM  CBC  WBC 4.0 - 10.5 K/uL 7.4  9.0  8.4   Hemoglobin 12.0 - 15.0 g/dL 85.5  86.5  87.2   Hematocrit 36.0 - 46.0 % 43.2  40.7  39.9   Platelets 150 - 400 K/uL 318  279  257       Latest Ref Rng & Units 01/15/2024    4:06 AM 01/14/2024    8:09 AM 01/13/2024    9:58 PM  CMP  Glucose 70 - 99 mg/dL 886  852  866   BUN 8 - 23 mg/dL 19  22  28    Creatinine 0.44 - 1.00 mg/dL 9.05  8.87  8.66   Sodium 135 - 145 mmol/L 129  129  129   Potassium 3.5 - 5.1 mmol/L 3.3  3.0  2.5   Chloride 98 - 111 mmol/L 87  81  80   CO2 22 - 32 mmol/L 32  37  32   Calcium  8.9 - 10.3 mg/dL 8.4  8.6  8.8   Total Protein 6.5 - 8.1 g/dL 5.8     Total Bilirubin 0.0 - 1.2 mg/dL 0.8     Alkaline Phos 38 - 126 U/L 173     AST 15 - 41 U/L 19     ALT 0 - 44 U/L 11          Micro Results No results found for this or any previous visit (from the past 240 hours).  Radiology Reports No results found.   SIGNED: Adriana DELENA Grams, MD, FHM. FAAFP. Jolynn Pack - [facility] Time spent - 55 min.  In seeing, evaluating and examining the patient. Reviewing medical records, labs, drawn plan of care. [facility],  Pager (please use [email or web address] to page/ text) Please use Epic Secure Chat for non-urgent communication (7AM-7PM)  If 7PM-7AM, please contact night-coverage [email or web address], 01/15/2024, 11:52 AM

## 2024-01-15 NOTE — Plan of Care (Signed)
 ?  Problem: Clinical Measurements: ?Goal: Ability to maintain clinical measurements within normal limits will improve ?Outcome: Progressing ?  ?Problem: Clinical Measurements: ?Goal: Will remain free from infection ?Outcome: Progressing ?  ?Problem: Clinical Measurements: ?Goal: Respiratory complications will improve ?Outcome: Progressing ?  ?

## 2024-01-16 DIAGNOSIS — W19XXXD Unspecified fall, subsequent encounter: Secondary | ICD-10-CM | POA: Diagnosis not present

## 2024-01-16 DIAGNOSIS — J9601 Acute respiratory failure with hypoxia: Secondary | ICD-10-CM | POA: Diagnosis not present

## 2024-01-16 LAB — COMPREHENSIVE METABOLIC PANEL WITH GFR
ALT: 12 U/L (ref 0–44)
AST: 19 U/L (ref 15–41)
Albumin: 2.8 g/dL — ABNORMAL LOW (ref 3.5–5.0)
Alkaline Phosphatase: 174 U/L — ABNORMAL HIGH (ref 38–126)
Anion gap: 9 (ref 5–15)
BUN: 19 mg/dL (ref 8–23)
CO2: 31 mmol/L (ref 22–32)
Calcium: 9 mg/dL (ref 8.9–10.3)
Chloride: 89 mmol/L — ABNORMAL LOW (ref 98–111)
Creatinine, Ser: 0.8 mg/dL (ref 0.44–1.00)
GFR, Estimated: >60 mL/min (ref >60)
Glucose, Bld: 138 mg/dL — ABNORMAL HIGH (ref 70–99)
Potassium: 3.5 mmol/L (ref 3.5–5.1)
Sodium: 129 mmol/L — ABNORMAL LOW (ref 135–145)
Total Bilirubin: 0.4 mg/dL (ref 0.0–1.2)
Total Protein: 6 g/dL — ABNORMAL LOW (ref 6.5–8.1)

## 2024-01-16 LAB — BRAIN NATRIURETIC PEPTIDE: B Natriuretic Peptide: 179 pg/mL — ABNORMAL HIGH (ref 0.0–100.0)

## 2024-01-16 MED ORDER — DEMECLOCYCLINE HCL 150 MG PO TABS
150.0000 mg | ORAL_TABLET | Freq: Four times a day (QID) | ORAL | Status: AC
  Administered 2024-01-16 (×2): 150 mg via ORAL
  Filled 2024-01-16 (×2): qty 1

## 2024-01-16 MED ORDER — ALBUMIN HUMAN 25 % IV SOLN
25.0000 g | Freq: Once | INTRAVENOUS | Status: AC
  Administered 2024-01-16: 25 g via INTRAVENOUS
  Filled 2024-01-16: qty 100

## 2024-01-16 NOTE — Progress Notes (Signed)
 Virginia Young rm [address] [facility] Liaison Note:  This is a current GUIDE patient with [facility]. [facility] will continue to follow for discharge disposition. Please call with any questions or concerns. Hunter Seip, BSN, [employer] [phone]

## 2024-01-16 NOTE — Progress Notes (Addendum)
 Physical Therapy Treatment Patient Details Name: Virginia Young MRN: [id number] DOB: 02/13/1941 Today's Date: 01/16/2024   History of Present Illness Virginia Young is a 83 y.o. female with medical history significant of A-fib on Eliquis , depression, GERD, multiple sclerosis, CHF who presenting to the department after a fall.  Patient was just admitted on 7/5.  During this presentation she was at her assisted living facility when she had a fall and hit her head.  She was found to have pneumonia and completed the course of antibiotics.  PT evaluated her at that time and she was discharged back to assisted living with physical therapy.  Today she presented to the ER due to similar findings.  She had a mechanical fall tripped and fell.  She landed on the backside of her head.  She denied any loss of consciousness or other symptoms.  She was reportedly on oxygen  during her prior presentations however was not taking at this time.  In the ER she was notably hypoxic satting in the 80s on room air.  Lab's were obtained on presentation which showed potassium 2.5, creatinine 1.3, sodium 129, WBC 7.4, hemoglobin 14.4, BNP 808, chest x-ray showed hyperinflation consistent with emphysema.  CT spine showed no evidence of fracture.  CT head showed no acute findings.    PT Comments  Patient tolerated PT session well. Received pt supine in bed. Pt was mod I to get to EOB. Requesting to use [facility] as she had urgent urge to urinate. Close CGA for bed<>BSC with no AD.  Pt performed LE exercises while seated EOB, demonstrating good seated balance this date. Ambulation trial this date with pt requiring CGA/min A for safety. OT joins session during ambulation. Patient reports transient dizziness at end of gait trial. BP taken in sitting: 143/89.  Patient returned to recliner and left with OT to complete their session. Patient will benefit from continued skilled physical therapy acutely and in recommended venue in order to  continue progress towards goals and improve function.     If plan is discharge home, recommend the following: Help with stairs or ramp for entrance;Assistance with cooking/housework;A little help with bathing/dressing/bathroom;A little help with walking and/or transfers   Can travel by private vehicle        Equipment Recommendations  None recommended by PT    Recommendations for Other Services       Precautions / Restrictions Precautions Precautions: Fall Recall of Precautions/Restrictions: Intact Restrictions Weight Bearing Restrictions Per Provider Order: No     Mobility  Bed Mobility Overal bed mobility: Needs Assistance Bed Mobility: Supine to Sit     Supine to sit: Supervision, HOB elevated, Modified independent (Device/Increase time)     General bed mobility comments: HOB elevated as pt reports she typically has HOB elevated at ALF, mod I (inc time needed) but Sup for safety    Transfers Overall transfer level: Needs assistance Equipment used: Rolling walker (2 wheels) Transfers: Sit to/from Stand, Bed to chair/wheelchair/BSC Sit to Stand: Contact guard assist   Step pivot transfers: Contact guard assist       General transfer comment: CGA for safety as pt reports she feels off balance sometimes. No AD during bed<>BSC, but pt uses support of arm rests on BSC for support, CGA throughout    Ambulation/Gait Ambulation/Gait assistance: Contact guard assist, Min assist Gait Distance (Feet): 40 Feet Assistive device: Rolling walker (2 wheels) Gait Pattern/deviations: Decreased step length - left, Decreased stance time - right, Decreased stride length Gait  velocity: decreased     General Gait Details: min/CGA throughout, pt reporting she sometimes is off balance but no overt LOB during. slow labored movement during, inc time and multiple steps to complete turn   Optometrist     Tilt Bed    Modified Rankin (Stroke  Patients Only)       Balance Overall balance assessment: Needs assistance Sitting-balance support: Feet supported, No upper extremity supported Sitting balance-Leahy Scale: Good Sitting balance - Comments: seated at EOB   Standing balance support: Reliant on assistive device for balance, During functional activity, Bilateral upper extremity supported Standing balance-Leahy Scale: Fair Standing balance comment: using RW          Communication Communication Communication: Impaired Factors Affecting Communication: Hearing impaired  Cognition Arousal: Alert Behavior During Therapy: WFL for tasks assessed/performed, Anxious   PT - Cognitive impairments: No apparent impairments           Following commands: Intact      Cueing Cueing Techniques: Verbal cues, Tactile cues, Visual cues  Exercises General Exercises - Lower Extremity Long Arc Quad: AROM, Strengthening, Both, 10 reps, Seated Hip Flexion/Marching: AROM, Strengthening, Both, 10 reps, Seated Toe Raises: AROM, Strengthening, Both, 10 reps, Seated Heel Raises: AROM, Strengthening, Both, 10 reps, Seated    General Comments        Pertinent Vitals/Pain Pain Assessment Pain Assessment: No/denies pain    Home Living                          Prior Function            PT Goals (current goals can now be found in the care plan section) Acute Rehab PT Goals Patient Stated Goal: return home with ALF staff to assist PT Goal Formulation: With patient Time For Goal Achievement: 01/28/24 Potential to Achieve Goals: Good Progress towards PT goals: Progressing toward goals    Frequency    Min 3X/week      PT Plan      Co-evaluation PT/OT/SLP Co-Evaluation/Treatment: Yes Reason for Co-Treatment: To address functional/ADL transfers PT goals addressed during session: Mobility/safety with mobility;Balance;Proper use of DME OT goals addressed during session: ADL's and self-care      AM-PAC PT 6  Clicks Mobility   Outcome Measure  Help needed turning from your back to your side while in a flat bed without using bedrails?: None Help needed moving from lying on your back to sitting on the side of a flat bed without using bedrails?: A Little Help needed moving to and from a bed to a chair (including a wheelchair)?: A Little Help needed standing up from a chair using your arms (e.g., wheelchair or bedside chair)?: A Little Help needed to walk in hospital room?: A Lot Help needed climbing 3-5 steps with a railing? : A Lot 6 Click Score: 17    End of Session   Activity Tolerance: Patient tolerated treatment well;Patient limited by fatigue Patient left: in chair;Other (comment) (pt left with OT to finish treatment session)   PT Visit Diagnosis: Unsteadiness on feet (R26.81);Other abnormalities of gait and mobility (R26.89);Muscle weakness (generalized) (M62.81)     Time: 8976-8960 PT Time Calculation (min) (ACUTE ONLY): 16 min  Charges:    $Therapeutic Activity: 8-22 mins PT General Charges $$ ACUTE PT VISIT: 1 Visit  11:24 AM, 01/16/24 Rosaria Settler, PT, DPT [place] with [facility]

## 2024-01-16 NOTE — Plan of Care (Signed)
   Problem: Education: Goal: Knowledge of General Education information will improve Description: Including pain rating scale, medication(s)/side effects and non-pharmacologic comfort measures Outcome: Progressing   Problem: Clinical Measurements: Goal: Will remain free from infection Outcome: Progressing   Problem: Activity: Goal: Risk for activity intolerance will decrease Outcome: Progressing

## 2024-01-16 NOTE — Discharge Summary (Addendum)
 Progress note   Patient: Virginia Young MRN: [id number] DOB: [birth year]  Admit date:     01/13/2024  Discharge date: 01/16/24  Discharge Physician: Adriana DELENA Grams   PCP: Pia Kerney SQUIBB, MD    CMP and CBC in 1 week -results to PCP Follow-up with the PCP in 1-2 weeks Daily weight Continue to hold Lasix  and metolazone alone--till serum sodium improves, BP stabilizes  Discharge Diagnoses: Principal Problem:   Fall  Resolved Problems:   * No resolved hospital problems. *  Hospital Course: [name] is a 83 y.o. female with medical history significant of A-fib on Eliquis , depression, GERD, multiple sclerosis, CHF who presenting to the department after a fall.  Patient was just admitted on 7/5, from assisted living facility when she had a fall and hit her head.  She was found to have pneumonia and completed the course of antibiotics.  PT evaluated her at that time and she was discharged back to assisted living with physical therapy.   01/13/24-  Presented to the ER due to similar findings.  She had a mechanical fall tripped and fell.  She landed on the backside of her head.  She denied any loss of consciousness or other symptoms.  She was reportedly on oxygen  during her prior presentations however was not taking at this time.   ED:  she was notably hypoxic satting in the 80s on room air.  Lab's were obtained on presentation which showed potassium 2.5, creatinine 1.3, sodium 129, WBC 7.4, hemoglobin 14.4, BNP 108, chest x-ray showed hyperinflation consistent with emphysema.  CT spine showed no evidence of fracture.  CT head showed no acute findings.     Mechanical fall Acute metabolic encephalopathy-resolved  - Patient reportedly tripped at home -Patient poor historian and slightly altered - Potassium 2.5 on presentation was replaced -    Status post IV fluids PT evaluation   Paroxysmal A-fib-continue Eliquis    Acute hypoxic respiratory failure-patient has emphysema on  imaging is likely related to COPD.  Will continue oxygen  and wean as able no evidence of infection at this time   Mood disorder-continue home Depakote , Seroquel    Depression - continue Cymbalta    Hyperlipidemia - continue fibrate   Hypertension - continue metoprolol    GERD - continue pantoprazole     Consultants: PT/OT/TOC/palliative care team Procedures performed: Imaging Disposition: Skilled nursing facility Diet recommendation:  Discharge Diet Orders (From admission, onward)     Start     Ordered   01/16/24 0000  Diet - low sodium heart healthy        01/16/24 1121           Cardiac and Carb modified diet DISCHARGE MEDICATION: Allergies as of 01/16/2024       Reactions   Bactrim  [sulfamethoxazole -trimethoprim ]    Mouth sores   Hydrocortisone Other (See Comments)   Unknown, no reaction listed on MAR   Lipitor [atorvastatin  Calcium ]    myalgia   Lisinopril     cough        Medication List     PAUSE taking these medications    furosemide  40 MG tablet Wait to take this until: January 19, 2024 Commonly known as: LASIX  Take 40 mg by mouth daily.   metolazone 2.5 MG tablet Wait to take this until: January 19, 2024 Commonly known as: ZAROXOLYN Take 2.5 mg by mouth daily.       STOP taking these medications    amoxicillin -clavulanate 500-125 MG tablet Commonly known as: AUGMENTIN   pantoprazole  40 MG tablet Commonly known as: PROTONIX    traMADol  50 MG tablet Commonly known as: ULTRAM        TAKE these medications    acetaminophen  325 MG tablet Commonly known as: TYLENOL  Take 650 mg by mouth 3 (three) times daily.   diclofenac  Sodium 1 % Gel Commonly known as: VOLTAREN  Apply 2 g topically 2 (two) times daily.   divalproex  250 MG 24 hr tablet Commonly known as: DEPAKOTE  ER Take 1 tablet (250 mg total) by mouth daily.   docusate sodium  100 MG capsule Commonly known as: COLACE Take 100 mg by mouth daily.   DULoxetine  60 MG capsule Commonly  known as: CYMBALTA  Take 60 mg by mouth daily.   Eliquis  5 MG Tabs tablet Generic drug: apixaban  TAKE 1 TABLET BY MOUTH EVERY 12 HOURS   fenofibrate  48 MG tablet Commonly known as: TRICOR  Take 48 mg by mouth daily.   folic acid  1 MG tablet Commonly known as: FOLVITE  Take 1 mg by mouth daily.   ipratropium-albuterol  0.5-2.5 (3) MG/3ML Soln Commonly known as: DUONEB Take 3 mLs by nebulization 3 (three) times daily.   memantine  5 MG tablet Commonly known as: NAMENDA  Take 5 mg by mouth 2 (two) times daily.   metoprolol  tartrate 25 MG tablet Commonly known as: LOPRESSOR  Take 12.5 mg by mouth 3 (three) times daily. 8am,2pm,and 8pm   midodrine  2.5 MG tablet Commonly known as: PROAMATINE  Take 2.5 mg by mouth 3 (three) times daily. 730am,1130am,430pm   multivitamin tablet Take 1 tablet by mouth daily.   Nyamyc  powder Generic drug: nystatin  Apply 1 Application topically.   OXYGEN  Inhale into the lungs. 2 liters PRN - anytime when needed ( cardio rx'd)   polyethylene glycol 17 g packet Commonly known as: MIRALAX  / GLYCOLAX  Take 17 g by mouth daily as needed for mild constipation.   QUEtiapine  50 MG tablet Commonly known as: SEROQUEL  Take 50 mg by mouth 2 (two) times daily.   sodium chloride  1 g tablet Take 1 g by mouth daily.   Vitamin D3 1000 units Caps Take 1 capsule by mouth daily.               Discharge Care Instructions  (From admission, onward)           Start     Ordered   01/16/24 0000  Discharge wound care:       Comments: Per consultant wound care instructions   01/16/24 1121            Contact information for after-discharge care     Destination     [facility] .   Service: Skilled Nursing Contact information: [address] [place]  [phone]                    Discharge Exam: [name]   01/13/24 2059 01/14/24 0133  Weight: 119.3 kg 104 kg         General:  AAO x 3,  cooperative, no distress;   HEENT:  Normocephalic, PERRL, otherwise with in Normal limits   Neuro:  CNII-XII intact. , normal motor and sensation, reflexes intact   Lungs:   Clear to auscultation BL, Respirations unlabored,  No wheezes / crackles  Cardio:    S1/S2, RRR, No murmure, No Rubs or Gallops   Abdomen:  Soft, non-tender, bowel sounds active all four quadrants, no guarding or peritoneal signs.  Muscular  skeletal:  Limited exam -global  generalized weaknesses - in bed, able to move all 4 extremities,   2+ pulses,  symmetric, No pitting edema  Skin:  Dry, warm to touch, negative for any Rashes,  Wounds: Please see nursing documentation          Condition at discharge: fair  The results of significant diagnostics from this hospitalization (including imaging, microbiology, ancillary and laboratory) are listed below for reference.   Imaging Studies: DG Chest Portable 1 View Result Date: 01/13/2024 CLINICAL DATA:  Hypoxia EXAM: PORTABLE CHEST 1 VIEW COMPARISON:  12/27/2023 FINDINGS: Hyperinflation with emphysema. Bronchitic changes at the bases without consolidation or pleural effusion. Stable cardiomediastinal silhouette with aortic atherosclerosis. Dextroscoliosis. IMPRESSION: Hyperinflation with emphysema and bronchitic changes at the bases. Electronically Signed   By: Luke Bun M.D.   On: 01/13/2024 22:29   CT Head Wo Contrast Result Date: 01/13/2024 CLINICAL DATA:  Head and neck trauma bruising EXAM: CT HEAD WITHOUT CONTRAST CT CERVICAL SPINE WITHOUT CONTRAST TECHNIQUE: Multidetector CT imaging of the head and cervical spine was performed following the standard protocol without intravenous contrast. Multiplanar CT image reconstructions of the cervical spine were also generated. RADIATION DOSE REDUCTION: This exam was performed according to the departmental dose-optimization program which includes automated exposure control, adjustment of the mA  and/or kV according to patient size and/or use of iterative reconstruction technique. COMPARISON:  CT brain and cervical spine 12/27/2023 FINDINGS: CT HEAD FINDINGS Brain: No acute territorial infarction, hemorrhage or intracranial mass. Moderate severe atrophy. Moderate chronic small vessel ischemic changes of the white matter. Stable ventricle size Vascular: No hyperdense vessels.  Carotid vascular calcification Skull: Normal. Negative for fracture or focal lesion. Sinuses/Orbits: No acute finding. Other: Small right posterior scalp hematoma CT CERVICAL SPINE FINDINGS Alignment: Trace anterolisthesis C3 on C4, C4 on C5 and C5 on C6. Facet alignment is normal Skull base and vertebrae: No acute fracture. No primary bone lesion or focal pathologic process. Soft tissues and spinal canal: No prevertebral fluid or swelling. No visible canal hematoma. Disc levels: Multilevel degenerative change. Advanced disc space narrowing C5-C6, C6-C7 and C7-T1. Hypertrophic facet degenerative changes at multiple levels with foraminal narrowing. Upper chest: Negative. Other: None IMPRESSION: 1. No CT evidence for acute intracranial abnormality. Atrophy and chronic small vessel ischemic changes of the white matter. 2. Degenerative changes of the cervical spine. No acute osseous abnormality. Electronically Signed   By: Luke Bun M.D.   On: 01/13/2024 21:38   CT Cervical Spine Wo Contrast Result Date: 01/13/2024 CLINICAL DATA:  Head and neck trauma bruising EXAM: CT HEAD WITHOUT CONTRAST CT CERVICAL SPINE WITHOUT CONTRAST TECHNIQUE: Multidetector CT imaging of the head and cervical spine was performed following the standard protocol without intravenous contrast. Multiplanar CT image reconstructions of the cervical spine were also generated. RADIATION DOSE REDUCTION: This exam was performed according to the departmental dose-optimization program which includes automated exposure control, adjustment of the mA and/or kV according to  patient size and/or use of iterative reconstruction technique. COMPARISON:  CT brain and cervical spine 12/27/2023 FINDINGS: CT HEAD FINDINGS Brain: No acute territorial infarction, hemorrhage or intracranial mass. Moderate severe atrophy. Moderate chronic small vessel ischemic changes of the white matter. Stable ventricle size Vascular: No hyperdense vessels.  Carotid vascular calcification Skull: Normal. Negative for fracture or focal lesion. Sinuses/Orbits: No acute finding. Other: Small right posterior scalp hematoma CT CERVICAL SPINE FINDINGS Alignment: Trace anterolisthesis C3 on C4, C4 on C5 and C5 on C6. Facet alignment is normal Skull base and vertebrae: No acute  fracture. No primary bone lesion or focal pathologic process. Soft tissues and spinal canal: No prevertebral fluid or swelling. No visible canal hematoma. Disc levels: Multilevel degenerative change. Advanced disc space narrowing C5-C6, C6-C7 and C7-T1. Hypertrophic facet degenerative changes at multiple levels with foraminal narrowing. Upper chest: Negative. Other: None IMPRESSION: 1. No CT evidence for acute intracranial abnormality. Atrophy and chronic small vessel ischemic changes of the white matter. 2. Degenerative changes of the cervical spine. No acute osseous abnormality. Electronically Signed   By: Luke Bun M.D.   On: 01/13/2024 21:38   DG Chest Portable 1 View Result Date: 12/27/2023 CLINICAL DATA:  hypoxia EXAM: PORTABLE CHEST 1 VIEW COMPARISON:  Chest x-ray 04/01/2023, CT C-spine 12/27/2023 FINDINGS: The heart and mediastinal contours are unchanged. Atherosclerotic plaque. Right costophrenic angle nodule like airspace opacity. No focal consolidation. Chronic coarsened interstitial markings with no overt pulmonary edema. No pleural effusion. No pneumothorax. No acute osseous abnormality.  Right shoulder arthroplasty. IMPRESSION: 1. Right costophrenic angle nodule like airspace opacity. Followup PA and lateral chest X-ray is  recommended in 3-4 weeks following therapy to ensure resolution. 2. Aortic Atherosclerosis (ICD10-I70.0) and Emphysema (ICD10-J43.9). Electronically Signed   By: Morgane  Naveau M.D.   On: 12/27/2023 20:01   CT PELVIS WO CONTRAST Result Date: 12/27/2023 CLINICAL DATA:  Hip trauma, fracture suspected, xray done.  Fall EXAM: CT PELVIS WITHOUT CONTRAST TECHNIQUE: Multidetector CT imaging of the pelvis was performed following the standard protocol without intravenous contrast. RADIATION DOSE REDUCTION: This exam was performed according to the departmental dose-optimization program which includes automated exposure control, adjustment of the mA and/or kV according to patient size and/or use of iterative reconstruction technique. COMPARISON:  CT lumbar spine 12/27/2023 FINDINGS: Urinary Tract:  No abnormality visualized. Bowel: Colonic diverticulosis. Unremarkable visualized pelvic bowel loops. Vascular/Lymphatic: No pathologically enlarged lymph nodes. No significant vascular abnormality seen. Reproductive: Calcified uterine fibroids. No mass or other significant abnormality Other: No intraperitoneal free fluid. No intraperitoneal free gas. No organized fluid collection. Musculoskeletal: No acute displaced fracture or dislocation of either hips. No acute displaced fracture or diastasis of the bones of the pelvis. Densely sclerotic lesion of the right sacral al is chronic and likely represents a bone island. No severe degenerative changes. Diffusely decreased bone density. IMPRESSION: 1. Negative for acute traumatic injury. 2. Diffusely decreased bone density. Electronically Signed   By: Morgane  Naveau M.D.   On: 12/27/2023 20:00   CT Lumbar Spine Wo Contrast Result Date: 12/27/2023 CLINICAL DATA:  Back trauma, no prior imaging (Age >= 16y) pt fall going to bathroom. Denies LOC. Pt on blood thinners. Main complaint of hip and back pain with minor shortness of breath. EMS placed pt on 3 lpm o2 [place] EXAM: CT LUMBAR SPINE  WITHOUT CONTRAST TECHNIQUE: Multidetector CT imaging of the lumbar spine was performed without intravenous contrast administration. Multiplanar CT image reconstructions were also generated. RADIATION DOSE REDUCTION: This exam was performed according to the departmental dose-optimization program which includes automated exposure control, adjustment of the mA and/or kV according to patient size and/or use of iterative reconstruction technique. COMPARISON:  CT abdomen pelvis and CT lumbar spine 06/22/2021 FINDINGS: Segmentation: 5 lumbar type vertebrae. Alignment: Levoscoliosis of the thoracolumbar spine centered at the L2 level. 6 mm rightward translocation of the L4 vertebral body in relation to the L3. Vertebrae: Multilevel severe degenerative changes of the spine. No associated severe osseous neural foraminal or central canal stenosis. No acute fracture or focal pathologic process. Paraspinal and other soft tissues: Negative.  Disc levels: Multilevel intervertebral disc space vacuum phenomenon. Other: Atherosclerotic plaque.  Colonic diverticulosis. IMPRESSION: 1. No acute displaced fracture or traumatic listhesis of the lumbar spine. 2.  Aortic Atherosclerosis (ICD10-I70.0). Electronically Signed   By: Morgane  Naveau M.D.   On: 12/27/2023 19:58   DG Lumbar Spine Complete Result Date: 12/27/2023 CLINICAL DATA:  Fall with back pain EXAM: LUMBAR SPINE - COMPLETE 4+ VIEW COMPARISON:  CT 06/22/2021 FINDINGS: Levoscoliosis. Vertebral body heights are maintained. Advanced multilevel degenerative changes with diffuse disc space narrowing and osteophyte. Multilevel facet degenerative changes. Aortic atherosclerosis IMPRESSION: Levoscoliosis with advanced multilevel degenerative changes. No definite acute osseous abnormality Electronically Signed   By: Luke Bun M.D.   On: 12/27/2023 17:28   DG Knee Complete 4 Views Right Result Date: 12/27/2023 CLINICAL DATA:  Fall with knee pain EXAM: RIGHT KNEE - COMPLETE 4+ VIEW  COMPARISON:  06/22/2021 FINDINGS: No definitive acute fracture or malalignment. Severe tricompartment arthritis with bone on bone contact medial and patellofemoral joint spaces. Small knee effusion. Extensive vascular calcifications. IMPRESSION: No definitive acute osseous abnormality. Severe tricompartment arthritis. Small knee effusion. Electronically Signed   By: Luke Bun M.D.   On: 12/27/2023 17:27   DG Hip Unilat With Pelvis 2-3 Views Right Result Date: 12/27/2023 CLINICAL DATA:  Fall, right hip pain EXAM: DG HIP (WITH OR WITHOUT PELVIS) 2-3V RIGHT COMPARISON:  None Available. FINDINGS: Symmetric degenerative changes in the hips with joint space narrowing and spurring. No acute bony abnormality. Specifically, no fracture, subluxation, or dislocation. IMPRESSION: No acute bony abnormality. Electronically Signed   By: Franky Crease M.D.   On: 12/27/2023 17:25   CT Cervical Spine Wo Contrast Result Date: 12/27/2023 CLINICAL DATA:  Neck trauma. Fall with head injury. On blood thinners. EXAM: CT CERVICAL SPINE WITHOUT CONTRAST TECHNIQUE: Multidetector CT imaging of the cervical spine was performed without intravenous contrast. Multiplanar CT image reconstructions were also generated. RADIATION DOSE REDUCTION: This exam was performed according to the departmental dose-optimization program which includes automated exposure control, adjustment of the mA and/or kV according to patient size and/or use of iterative reconstruction technique. COMPARISON:  Cervical spine radiographs 08/15/2020. Cervical MRI 07/25/2016. FINDINGS: Alignment: Reversal of the usual cervical lordosis, similar to prior imaging. Minimal stepwise anterolisthesis at C3-4, C4-5 and C5-6. Skull base and vertebrae: No evidence of acute cervical spine fracture or traumatic subluxation. Soft tissues and spinal canal: No prevertebral fluid or swelling. No visible canal hematoma. Disc levels: Multilevel spondylosis with disc space narrowing,  endplate osteophytes and uncinate spurring, most advanced at C5-6 and C6-7. There is multilevel facet arthropathy. No large disc herniation or high-grade foraminal narrowing demonstrated. There is multilevel foraminal narrowing which appears worst on the right at C3-4 and C4-5 and on the left at C5-6. Upper chest: Emphysematous changes at the lung apices. Other: Bilateral carotid atherosclerosis. Previous right shoulder reverse arthroplasty. IMPRESSION: 1. No evidence of acute cervical spine fracture, traumatic subluxation or static signs of instability. 2. Multilevel cervical spondylosis as described. Electronically Signed   By: Elsie Perone M.D.   On: 12/27/2023 17:18   CT Head Wo Contrast Result Date: 12/27/2023 CLINICAL DATA:  Head trauma, minor (Age >= 65y).  Fall. EXAM: CT HEAD WITHOUT CONTRAST TECHNIQUE: Contiguous axial images were obtained from the base of the skull through the vertex without intravenous contrast. RADIATION DOSE REDUCTION: This exam was performed according to the departmental dose-optimization program which includes automated exposure control, adjustment of the mA and/or kV according to patient size and/or use of  iterative reconstruction technique. COMPARISON:  09/15/2020 FINDINGS: Brain: There is atrophy and chronic small vessel disease changes. Old left thalamic lacunar infarct. No acute intracranial abnormality. Specifically, no hemorrhage, hydrocephalus, mass lesion, acute infarction, or significant intracranial injury. Vascular: No hyperdense vessel or unexpected calcification. Skull: No acute calvarial abnormality. Sinuses/Orbits: No acute findings Other: None IMPRESSION: Atrophy, chronic microvascular disease. No acute intracranial abnormality. Old left thalamic lacunar infarct. Electronically Signed   By: Franky Crease M.D.   On: 12/27/2023 17:16    Microbiology: Results for orders placed or performed during the hospital encounter of 12/27/23  Respiratory (~20 pathogens)  panel by PCR     Status: None   Collection Time: 12/28/23 11:17 AM   Specimen: Nasopharyngeal Swab; Respiratory  Result Value Ref Range Status   Adenovirus NOT DETECTED NOT DETECTED Final   Coronavirus 229E NOT DETECTED NOT DETECTED Final    Comment: (NOTE) The Coronavirus on the Respiratory Panel, DOES NOT test for the novel  Coronavirus (2019 nCoV)    Coronavirus HKU1 NOT DETECTED NOT DETECTED Final   Coronavirus NL63 NOT DETECTED NOT DETECTED Final   Coronavirus OC43 NOT DETECTED NOT DETECTED Final   Metapneumovirus NOT DETECTED NOT DETECTED Final   Rhinovirus / Enterovirus NOT DETECTED NOT DETECTED Final   Influenza A NOT DETECTED NOT DETECTED Final   Influenza B NOT DETECTED NOT DETECTED Final   Parainfluenza Virus 1 NOT DETECTED NOT DETECTED Final   Parainfluenza Virus 2 NOT DETECTED NOT DETECTED Final   Parainfluenza Virus 3 NOT DETECTED NOT DETECTED Final   Parainfluenza Virus 4 NOT DETECTED NOT DETECTED Final   Respiratory Syncytial Virus NOT DETECTED NOT DETECTED Final   Bordetella pertussis NOT DETECTED NOT DETECTED Final   Bordetella Parapertussis NOT DETECTED NOT DETECTED Final   Chlamydophila pneumoniae NOT DETECTED NOT DETECTED Final   Mycoplasma pneumoniae NOT DETECTED NOT DETECTED Final    Comment: Performed at [facility], [address], [place], [place] [address]    Labs: CBC: Recent Labs  Lab 01/13/24 2158  WBC 7.4  HGB 14.4  HCT 43.2  MCV 84.5  PLT 318   Basic Metabolic Panel: Recent Labs  Lab 01/13/24 2158 01/14/24 0809 01/15/24 0406 01/16/24 0407  NA 129* 129* 129* 129*  K 2.5* 3.0* 3.3* 3.5  CL 80* 81* 87* 89*  CO2 32 37* 32 31  GLUCOSE 133* 147* 113* 138*  BUN 28* 22 19 19   CREATININE 1.33* 1.12* 0.94 0.80  CALCIUM  8.8* 8.6* 8.4* 9.0   Liver Function Tests: Recent Labs  Lab 01/15/24 0406 01/16/24 0407  AST 19 19  ALT 11 12  ALKPHOS 173* 174*  BILITOT 0.8 0.4  PROT 5.8* 6.0*  ALBUMIN 2.8* 2.8*   CBG: No results for  input(s): GLUCAP in the last 168 hours.  Discharge time spent: greater than 30 minutes.  Signed: Adriana DELENA Grams, MD [facility] 01/16/2024

## 2024-01-16 NOTE — Patient Instructions (Signed)

## 2024-01-16 NOTE — Progress Notes (Signed)
 Occupational Therapy Treatment Patient Details Name: Virginia Young MRN: [id number] DOB: April 15, 1941 Today's Date: 01/16/2024   History of present illness Virginia Young is a 83 y.o. female with medical history significant of A-fib on Eliquis , depression, GERD, multiple sclerosis, CHF who presenting to the department after a fall.  Patient was just admitted on 7/5.  During this presentation she was at her assisted living facility when she had a fall and hit her head.  She was found to have pneumonia and completed the course of antibiotics.  PT evaluated her at that time and she was discharged back to assisted living with physical therapy.  Today she presented to the ER due to similar findings.  She had a mechanical fall tripped and fell.  She landed on the backside of her head.  She denied any loss of consciousness or other symptoms.  She was reportedly on oxygen  during her prior presentations however was not taking at this time.  In the ER she was notably hypoxic satting in the 80s on room air.  Lab's were obtained on presentation which showed potassium 2.5, creatinine 1.3, sodium 129, WBC 7.4, hemoglobin 14.4, BNP 808, chest x-ray showed hyperinflation consistent with emphysema.  CT spine showed no evidence of fracture.  CT head showed no acute findings.   OT comments  Pt agreeable to OT treatment. PT present and with pt during ambulation then left the session. Pt demonstrated set up to mod I level of assist for lower body dressing today. Pt able to stand to complete hand washing at the sink but seemed to fatigue after a minute or so leading to pt wanting to return to the chair. Pt completed B UE strengthening x10 reps each exercise and was given an HEP to complete in the room. Pt left in the chair with call bell within reach. Pt will benefit from continued OT in the hospital and recommended venue below to increase strength, balance, and endurance for safe ADL's.         If plan is discharge  home, recommend the following:  A little help with bathing/dressing/bathroom;Assistance with cooking/housework;Assist for transportation;Help with stairs or ramp for entrance;A little help with walking and/or transfers   Equipment Recommendations  None recommended by OT          Precautions / Restrictions Precautions Precautions: Fall Recall of Precautions/Restrictions: Intact Restrictions Weight Bearing Restrictions Per Provider Order: No       Mobility Bed Mobility               General bed mobility comments: Seated at EOB with PT to start the session.    Transfers Overall transfer level: Needs assistance Equipment used: Rolling walker (2 wheels) Transfers: Sit to/from Stand, Bed to chair/wheelchair/BSC Sit to Stand: Contact guard assist     Step pivot transfers: Contact guard assist     General transfer comment: No physical assist needed. CGA for safety with use of RW.     Balance Overall balance assessment: Needs assistance Sitting-balance support: Feet supported, No upper extremity supported Sitting balance-Leahy Scale: Good Sitting balance - Comments: seated at EOB   Standing balance support: Reliant on assistive device for balance, During functional activity, Bilateral upper extremity supported Standing balance-Leahy Scale: Fair Standing balance comment: using RW                           ADL either performed or assessed with clinical judgement   ADL Overall ADL's :  Needs assistance/impaired     Grooming: Wash/dry hands;Standing;Contact guard assist;Supervision/safety Grooming Details (indicate cue type and reason): Able to wash hands standing at the sink. Attempted to also apply deodorant but pt appeared to be fatigued and wanted to sit. ~1 to 2 minutes standing to complete hand washing with RW present but pt able to use B UE functionally without use of RW.             Lower Body Dressing: Modified independent;Set up;Sitting/lateral  leans   Toilet Transfer: Contact guard assist;Rolling walker (2 wheels);Ambulation Toilet Transfer Details (indicate cue type and reason): EOB to chair and ambulation in the hall with RW.         Functional mobility during ADLs: Contact guard assist;Rolling walker (2 wheels) General ADL Comments: Able to ambulate in hall with RW.    Extremity/Trunk Assessment Upper Extremity Assessment Upper Extremity Assessment: Generalized weakness   Lower Extremity Assessment Lower Extremity Assessment: Defer to PT evaluation        Vision       Perception Perception Perception: Not tested   Praxis Praxis Praxis: Not tested   Communication Communication Communication: Impaired Factors Affecting Communication: Hearing impaired   Cognition Arousal: Alert Behavior During Therapy: WFL for tasks assessed/performed, Anxious Cognition: No apparent impairments                               Following commands: Intact        Cueing   Cueing Techniques: Verbal cues, Tactile cues  Exercises Exercises: Shoulder Shoulder Exercises Shoulder Flexion: AROM, Both, 10 reps, Seated Shoulder ABduction: AROM, Both, 10 reps, Seated (x10 protraction and horizontal abduction as well.)                 Pertinent Vitals/ Pain       Pain Assessment Pain Assessment: No/denies pain                                                          Frequency  Min 2X/week        Progress Toward Goals  OT Goals(current goals can now be found in the care plan section)  Progress towards OT goals: Progressing toward goals  Acute Rehab OT Goals Patient Stated Goal: improve function OT Goal Formulation: With patient Time For Goal Achievement: 01/28/24 Potential to Achieve Goals: Good ADL Goals Pt Will Perform Grooming: with modified independence;standing Pt Will Perform Lower Body Dressing: with modified independence Pt Will Transfer to Toilet: with modified  independence;ambulating Pt Will Perform Toileting - Clothing Manipulation and hygiene: with modified independence Pt/caregiver will Perform Home Exercise Program: Increased ROM;Increased strength;Both right and left upper extremity;Independently  Plan      Co-evaluation    PT/OT/SLP Co-Evaluation/Treatment: Yes (Briefly at the start of the session, then PT left leaving pt with this OT.) Reason for Co-Treatment: To address functional/ADL transfers   OT goals addressed during session: ADL's and self-care                          End of Session Equipment Utilized During Treatment: Rolling walker (2 wheels)  OT Visit Diagnosis: Unsteadiness on feet (R26.81);Other abnormalities of gait and mobility (R26.89);Muscle weakness (generalized) (M62.81);Repeated falls (R29.6);History of falling (  Z91.81)   Activity Tolerance Patient tolerated treatment well   Patient Left in chair;with call bell/phone within reach;with chair alarm set   Nurse Communication          Time: 8967-8948 OT Time Calculation (min): 19 min  Charges: OT General Charges $OT Visit: 1 Visit OT Treatments $Therapeutic Exercise: 8-22 mins  Lanayah Gartley OT, MOT  Jayson Person 01/16/2024, 11:03 AM

## 2024-01-17 DIAGNOSIS — J9601 Acute respiratory failure with hypoxia: Secondary | ICD-10-CM | POA: Diagnosis not present

## 2024-01-17 DIAGNOSIS — W19XXXD Unspecified fall, subsequent encounter: Secondary | ICD-10-CM | POA: Diagnosis not present

## 2024-01-17 LAB — COMPREHENSIVE METABOLIC PANEL WITH GFR
ALT: 11 U/L (ref 0–44)
AST: 17 U/L (ref 15–41)
Albumin: 3 g/dL — ABNORMAL LOW (ref 3.5–5.0)
Alkaline Phosphatase: 162 U/L — ABNORMAL HIGH (ref 38–126)
Anion gap: 10 (ref 5–15)
BUN: 18 mg/dL (ref 8–23)
CO2: 32 mmol/L (ref 22–32)
Calcium: 9.2 mg/dL (ref 8.9–10.3)
Chloride: 90 mmol/L — ABNORMAL LOW (ref 98–111)
Creatinine, Ser: 0.81 mg/dL (ref 0.44–1.00)
GFR, Estimated: >60 mL/min (ref >60)
Glucose, Bld: 111 mg/dL — ABNORMAL HIGH (ref 70–99)
Potassium: 3.7 mmol/L (ref 3.5–5.1)
Sodium: 132 mmol/L — ABNORMAL LOW (ref 135–145)
Total Bilirubin: 0.8 mg/dL (ref 0.0–1.2)
Total Protein: 5.8 g/dL — ABNORMAL LOW (ref 6.5–8.1)

## 2024-01-17 NOTE — Progress Notes (Signed)
 IV removed and patient assisted to dress and transfer to [facility].  Report called to [facility] at [place] .  Transported by WC to [employer].

## 2024-01-17 NOTE — Plan of Care (Signed)
   Problem: Clinical Measurements: Goal: Respiratory complications will improve Outcome: Progressing   Problem: Activity: Goal: Risk for activity intolerance will decrease Outcome: Progressing   Problem: Elimination: Goal: Will not experience complications related to bowel motility Outcome: Progressing

## 2024-01-17 NOTE — Progress Notes (Signed)
 Mobility Specialist Progress Note:    01/17/24 1110  Mobility  Activity Ambulated with assistance to bathroom;Transferred from chair to bed  Level of Assistance Contact guard assist, steadying assist  Assistive Device Front wheel walker  Distance Ambulated (ft) 10 ft  Range of Motion/Exercises Active;All extremities  Activity Response Tolerated well  Mobility Referral Yes  Mobility visit 1 Mobility  Mobility Specialist Start Time (ACUTE ONLY) 1110  Mobility Specialist Stop Time (ACUTE ONLY) 1130  Mobility Specialist Time Calculation (min) (ACUTE ONLY) 20 min   Pt received in chair, requesting assistance to bathroom. Required CGA to stand and ambulate with RW. Tolerated well, asx throughout. Left pt supine, NT in room. All needs met.  Alithea Lapage Mobility Specialist Please contact via Special educational needs teacher or  Rehab office at [phone]

## 2024-01-17 NOTE — Progress Notes (Signed)
 Pt discharged via WC to [facility]. Discharge paperwork with pt, pt's belongings returned to pt.

## 2024-01-17 NOTE — Discharge Summary (Signed)
 Discharge summary   Patient: Virginia Young MRN: [id number] DOB: [birth year]  Admit date:     01/13/2024  Discharge date: 01/17/24  Discharge Physician: Adriana DELENA Grams   PCP: Pia Kerney SQUIBB, MD   The patient was examined, stable for discharge   CMP and CBC in 1 week -results to PCP Follow-up with the PCP in 1-2 weeks Daily weight Continue to hold Lasix  and metolazone alone--till serum sodium improves, BP stabilizes  Discharge Diagnoses: Principal Problem:   Fall  Resolved Problems:   * No resolved hospital problems. *  Hospital Course: Virginia Young is a 83 y.o. female with medical history significant of A-fib on Eliquis , depression, GERD, multiple sclerosis, CHF who presenting to the department after a fall.  Patient was just admitted on 7/5, from assisted living facility when she had a fall and hit her head.  She was found to have pneumonia and completed the course of antibiotics.  PT evaluated her at that time and she was discharged back to assisted living with physical therapy.   01/13/24-  Presented to the ER due to similar findings.  She had a mechanical fall tripped and fell.  She landed on the backside of her head.  She denied any loss of consciousness or other symptoms.  She was reportedly on oxygen  during her prior presentations however was not taking at this time.   ED:  she was notably hypoxic satting in the 80s on room air.  Lab's were obtained on presentation which showed potassium 2.5, creatinine 1.3, sodium 129, WBC 7.4, hemoglobin 14.4, BNP 108, chest x-ray showed hyperinflation consistent with emphysema.  CT spine showed no evidence of fracture.  CT head showed no acute findings.     Mechanical fall Acute metabolic encephalopathy-resolved  - Patient reportedly tripped at home -Patient poor historian and slightly altered - Potassium 2.5 on presentation was replaced -    Status post IV fluids PT evaluation   Paroxysmal A-fib-continue Eliquis    Acute  hypoxic respiratory failure-patient has emphysema on imaging is likely related to COPD.  Will continue oxygen  and wean as able no evidence of infection at this time   Mood disorder-continue home Depakote , Seroquel    Depression - continue Cymbalta    Hyperlipidemia - continue fibrate   Hypertension - continue metoprolol    GERD - continue pantoprazole     Consultants: PT/OT/TOC/palliative care team Procedures performed: Imaging Disposition: Skilled nursing facility Diet recommendation:  Discharge Diet Orders (From admission, onward)     Start     Ordered   01/16/24 0000  Diet - low sodium heart healthy        01/16/24 1121           Cardiac and Carb modified diet DISCHARGE MEDICATION: Allergies as of 01/17/2024       Reactions   Bactrim  [sulfamethoxazole -trimethoprim ]    Mouth sores   Hydrocortisone Other (See Comments)   Unknown, no reaction listed on MAR   Lipitor [atorvastatin  Calcium ]    myalgia   Lisinopril     cough        Medication List     PAUSE taking these medications    furosemide  40 MG tablet Wait to take this until: January 19, 2024 Commonly known as: LASIX  Take 40 mg by mouth daily.   metolazone 2.5 MG tablet Wait to take this until: January 19, 2024 Commonly known as: ZAROXOLYN Take 2.5 mg by mouth daily.       STOP taking these medications    amoxicillin -clavulanate  500-125 MG tablet Commonly known as: AUGMENTIN    pantoprazole  40 MG tablet Commonly known as: PROTONIX    traMADol  50 MG tablet Commonly known as: ULTRAM        TAKE these medications    acetaminophen  325 MG tablet Commonly known as: TYLENOL  Take 650 mg by mouth 3 (three) times daily.   diclofenac  Sodium 1 % Gel Commonly known as: VOLTAREN  Apply 2 g topically 2 (two) times daily.   divalproex  250 MG 24 hr tablet Commonly known as: DEPAKOTE  ER Take 1 tablet (250 mg total) by mouth daily.   docusate sodium  100 MG capsule Commonly known as: COLACE Take 100 mg by  mouth daily.   DULoxetine  60 MG capsule Commonly known as: CYMBALTA  Take 60 mg by mouth daily.   Eliquis  5 MG Tabs tablet Generic drug: apixaban  TAKE 1 TABLET BY MOUTH EVERY 12 HOURS   fenofibrate  48 MG tablet Commonly known as: TRICOR  Take 48 mg by mouth daily.   folic acid  1 MG tablet Commonly known as: FOLVITE  Take 1 mg by mouth daily.   ipratropium-albuterol  0.5-2.5 (3) MG/3ML Soln Commonly known as: DUONEB Take 3 mLs by nebulization 3 (three) times daily.   memantine  5 MG tablet Commonly known as: NAMENDA  Take 5 mg by mouth 2 (two) times daily.   metoprolol  tartrate 25 MG tablet Commonly known as: LOPRESSOR  Take 12.5 mg by mouth 3 (three) times daily. 8am,2pm,and 8pm   midodrine  2.5 MG tablet Commonly known as: PROAMATINE  Take 2.5 mg by mouth 3 (three) times daily. 730am,1130am,430pm   multivitamin tablet Take 1 tablet by mouth daily.   Nyamyc  powder Generic drug: nystatin  Apply 1 Application topically.   OXYGEN  Inhale into the lungs. 2 liters PRN - anytime when needed ( cardio rx'd)   polyethylene glycol 17 g packet Commonly known as: MIRALAX  / GLYCOLAX  Take 17 g by mouth daily as needed for mild constipation.   QUEtiapine  50 MG tablet Commonly known as: SEROQUEL  Take 50 mg by mouth 2 (two) times daily.   sodium chloride  1 g tablet Take 1 g by mouth daily.   Vitamin D3 1000 units Caps Take 1 capsule by mouth daily.               Discharge Care Instructions  (From admission, onward)           Start     Ordered   01/16/24 0000  Discharge wound care:       Comments: Per consultant wound care instructions   01/16/24 1121            Contact information for after-discharge care     Destination     [facility] .   Service: Skilled Nursing Contact information: [address] [place]   [address] [phone]                    Discharge Exam: [name]    01/13/24 2059 01/14/24 0133  Weight: 119.3 kg 104 kg     General:  AAO x 3,  cooperative, no distress;   HEENT:  Normocephalic, PERRL, otherwise with in Normal limits   Neuro:  CNII-XII intact. , normal motor and sensation, reflexes intact   Lungs:   Clear to auscultation BL, Respirations unlabored,  No wheezes / crackles  Cardio:    S1/S2, RRR, No murmure, No Rubs or Gallops   Abdomen:  Soft, non-tender, bowel sounds active all four quadrants, no guarding or peritoneal signs.  Muscular  skeletal:  Limited exam -global generalized weaknesses - in bed, able to move all 4 extremities,   2+ pulses,  symmetric, No pitting edema  Skin:  Dry, warm to touch, negative for any Rashes,  Wounds: Please see nursing documentation      Condition at discharge: fair  The results of significant diagnostics from this hospitalization (including imaging, microbiology, ancillary and laboratory) are listed below for reference.   Imaging Studies: DG Chest Portable 1 View Result Date: 01/13/2024 CLINICAL DATA:  Hypoxia EXAM: PORTABLE CHEST 1 VIEW COMPARISON:  12/27/2023 FINDINGS: Hyperinflation with emphysema. Bronchitic changes at the bases without consolidation or pleural effusion. Stable cardiomediastinal silhouette with aortic atherosclerosis. Dextroscoliosis. IMPRESSION: Hyperinflation with emphysema and bronchitic changes at the bases. Electronically Signed   By: Luke Bun M.D.   On: 01/13/2024 22:29   CT Head Wo Contrast Result Date: 01/13/2024 CLINICAL DATA:  Head and neck trauma bruising EXAM: CT HEAD WITHOUT CONTRAST CT CERVICAL SPINE WITHOUT CONTRAST TECHNIQUE: Multidetector CT imaging of the head and cervical spine was performed following the standard protocol without intravenous contrast. Multiplanar CT image reconstructions of the cervical spine were also generated. RADIATION DOSE REDUCTION: This exam was performed according to the departmental dose-optimization program which includes  automated exposure control, adjustment of the mA and/or kV according to patient size and/or use of iterative reconstruction technique. COMPARISON:  CT brain and cervical spine 12/27/2023 FINDINGS: CT HEAD FINDINGS Brain: No acute territorial infarction, hemorrhage or intracranial mass. Moderate severe atrophy. Moderate chronic small vessel ischemic changes of the white matter. Stable ventricle size Vascular: No hyperdense vessels.  Carotid vascular calcification Skull: Normal. Negative for fracture or focal lesion. Sinuses/Orbits: No acute finding. Other: Small right posterior scalp hematoma CT CERVICAL SPINE FINDINGS Alignment: Trace anterolisthesis C3 on C4, C4 on C5 and C5 on C6. Facet alignment is normal Skull base and vertebrae: No acute fracture. No primary bone lesion or focal pathologic process. Soft tissues and spinal canal: No prevertebral fluid or swelling. No visible canal hematoma. Disc levels: Multilevel degenerative change. Advanced disc space narrowing C5-C6, C6-C7 and C7-T1. Hypertrophic facet degenerative changes at multiple levels with foraminal narrowing. Upper chest: Negative. Other: None IMPRESSION: 1. No CT evidence for acute intracranial abnormality. Atrophy and chronic small vessel ischemic changes of the white matter. 2. Degenerative changes of the cervical spine. No acute osseous abnormality. Electronically Signed   By: Luke Bun M.D.   On: 01/13/2024 21:38   CT Cervical Spine Wo Contrast Result Date: 01/13/2024 CLINICAL DATA:  Head and neck trauma bruising EXAM: CT HEAD WITHOUT CONTRAST CT CERVICAL SPINE WITHOUT CONTRAST TECHNIQUE: Multidetector CT imaging of the head and cervical spine was performed following the standard protocol without intravenous contrast. Multiplanar CT image reconstructions of the cervical spine were also generated. RADIATION DOSE REDUCTION: This exam was performed according to the departmental dose-optimization program which includes automated exposure  control, adjustment of the mA and/or kV according to patient size and/or use of iterative reconstruction technique. COMPARISON:  CT brain and cervical spine 12/27/2023 FINDINGS: CT HEAD FINDINGS Brain: No acute territorial infarction, hemorrhage or intracranial mass. Moderate severe atrophy. Moderate chronic small vessel ischemic changes of the white matter. Stable ventricle size Vascular: No hyperdense vessels.  Carotid vascular calcification Skull: Normal. Negative for fracture or focal lesion. Sinuses/Orbits: No acute finding. Other: Small right posterior scalp hematoma CT CERVICAL SPINE FINDINGS Alignment: Trace anterolisthesis C3 on C4, C4 on C5 and C5 on C6. Facet alignment is normal Skull base and vertebrae: No  acute fracture. No primary bone lesion or focal pathologic process. Soft tissues and spinal canal: No prevertebral fluid or swelling. No visible canal hematoma. Disc levels: Multilevel degenerative change. Advanced disc space narrowing C5-C6, C6-C7 and C7-T1. Hypertrophic facet degenerative changes at multiple levels with foraminal narrowing. Upper chest: Negative. Other: None IMPRESSION: 1. No CT evidence for acute intracranial abnormality. Atrophy and chronic small vessel ischemic changes of the white matter. 2. Degenerative changes of the cervical spine. No acute osseous abnormality. Electronically Signed   By: Luke Bun M.D.   On: 01/13/2024 21:38   DG Chest Portable 1 View Result Date: 12/27/2023 CLINICAL DATA:  hypoxia EXAM: PORTABLE CHEST 1 VIEW COMPARISON:  Chest x-ray 04/01/2023, CT C-spine 12/27/2023 FINDINGS: The heart and mediastinal contours are unchanged. Atherosclerotic plaque. Right costophrenic angle nodule like airspace opacity. No focal consolidation. Chronic coarsened interstitial markings with no overt pulmonary edema. No pleural effusion. No pneumothorax. No acute osseous abnormality.  Right shoulder arthroplasty. IMPRESSION: 1. Right costophrenic angle nodule like airspace  opacity. Followup PA and lateral chest X-ray is recommended in 3-4 weeks following therapy to ensure resolution. 2. Aortic Atherosclerosis (ICD10-I70.0) and Emphysema (ICD10-J43.9). Electronically Signed   By: Morgane  Naveau M.D.   On: 12/27/2023 20:01   CT PELVIS WO CONTRAST Result Date: 12/27/2023 CLINICAL DATA:  Hip trauma, fracture suspected, xray done.  Fall EXAM: CT PELVIS WITHOUT CONTRAST TECHNIQUE: Multidetector CT imaging of the pelvis was performed following the standard protocol without intravenous contrast. RADIATION DOSE REDUCTION: This exam was performed according to the departmental dose-optimization program which includes automated exposure control, adjustment of the mA and/or kV according to patient size and/or use of iterative reconstruction technique. COMPARISON:  CT lumbar spine 12/27/2023 FINDINGS: Urinary Tract:  No abnormality visualized. Bowel: Colonic diverticulosis. Unremarkable visualized pelvic bowel loops. Vascular/Lymphatic: No pathologically enlarged lymph nodes. No significant vascular abnormality seen. Reproductive: Calcified uterine fibroids. No mass or other significant abnormality Other: No intraperitoneal free fluid. No intraperitoneal free gas. No organized fluid collection. Musculoskeletal: No acute displaced fracture or dislocation of either hips. No acute displaced fracture or diastasis of the bones of the pelvis. Densely sclerotic lesion of the right sacral al is chronic and likely represents a bone island. No severe degenerative changes. Diffusely decreased bone density. IMPRESSION: 1. Negative for acute traumatic injury. 2. Diffusely decreased bone density. Electronically Signed   By: Morgane  Naveau M.D.   On: 12/27/2023 20:00   CT Lumbar Spine Wo Contrast Result Date: 12/27/2023 CLINICAL DATA:  Back trauma, no prior imaging (Age >= 16y) pt fall going to bathroom. Denies LOC. Pt on blood thinners. Main complaint of hip and back pain with minor shortness of breath.  EMS placed pt on 3 lpm o2 [place] EXAM: CT LUMBAR SPINE WITHOUT CONTRAST TECHNIQUE: Multidetector CT imaging of the lumbar spine was performed without intravenous contrast administration. Multiplanar CT image reconstructions were also generated. RADIATION DOSE REDUCTION: This exam was performed according to the departmental dose-optimization program which includes automated exposure control, adjustment of the mA and/or kV according to patient size and/or use of iterative reconstruction technique. COMPARISON:  CT abdomen pelvis and CT lumbar spine 06/22/2021 FINDINGS: Segmentation: 5 lumbar type vertebrae. Alignment: Levoscoliosis of the thoracolumbar spine centered at the L2 level. 6 mm rightward translocation of the L4 vertebral body in relation to the L3. Vertebrae: Multilevel severe degenerative changes of the spine. No associated severe osseous neural foraminal or central canal stenosis. No acute fracture or focal pathologic process. Paraspinal and other soft tissues:  Negative. Disc levels: Multilevel intervertebral disc space vacuum phenomenon. Other: Atherosclerotic plaque.  Colonic diverticulosis. IMPRESSION: 1. No acute displaced fracture or traumatic listhesis of the lumbar spine. 2.  Aortic Atherosclerosis (ICD10-I70.0). Electronically Signed   By: Morgane  Naveau M.D.   On: 12/27/2023 19:58   DG Lumbar Spine Complete Result Date: 12/27/2023 CLINICAL DATA:  Fall with back pain EXAM: LUMBAR SPINE - COMPLETE 4+ VIEW COMPARISON:  CT 06/22/2021 FINDINGS: Levoscoliosis. Vertebral body heights are maintained. Advanced multilevel degenerative changes with diffuse disc space narrowing and osteophyte. Multilevel facet degenerative changes. Aortic atherosclerosis IMPRESSION: Levoscoliosis with advanced multilevel degenerative changes. No definite acute osseous abnormality Electronically Signed   By: Luke Bun M.D.   On: 12/27/2023 17:28   DG Knee Complete 4 Views Right Result Date: 12/27/2023 CLINICAL DATA:  Fall  with knee pain EXAM: RIGHT KNEE - COMPLETE 4+ VIEW COMPARISON:  06/22/2021 FINDINGS: No definitive acute fracture or malalignment. Severe tricompartment arthritis with bone on bone contact medial and patellofemoral joint spaces. Small knee effusion. Extensive vascular calcifications. IMPRESSION: No definitive acute osseous abnormality. Severe tricompartment arthritis. Small knee effusion. Electronically Signed   By: Luke Bun M.D.   On: 12/27/2023 17:27   DG Hip Unilat With Pelvis 2-3 Views Right Result Date: 12/27/2023 CLINICAL DATA:  Fall, right hip pain EXAM: DG HIP (WITH OR WITHOUT PELVIS) 2-3V RIGHT COMPARISON:  None Available. FINDINGS: Symmetric degenerative changes in the hips with joint space narrowing and spurring. No acute bony abnormality. Specifically, no fracture, subluxation, or dislocation. IMPRESSION: No acute bony abnormality. Electronically Signed   By: Franky Crease M.D.   On: 12/27/2023 17:25   CT Cervical Spine Wo Contrast Result Date: 12/27/2023 CLINICAL DATA:  Neck trauma. Fall with head injury. On blood thinners. EXAM: CT CERVICAL SPINE WITHOUT CONTRAST TECHNIQUE: Multidetector CT imaging of the cervical spine was performed without intravenous contrast. Multiplanar CT image reconstructions were also generated. RADIATION DOSE REDUCTION: This exam was performed according to the departmental dose-optimization program which includes automated exposure control, adjustment of the mA and/or kV according to patient size and/or use of iterative reconstruction technique. COMPARISON:  Cervical spine radiographs 08/15/2020. Cervical MRI 07/25/2016. FINDINGS: Alignment: Reversal of the usual cervical lordosis, similar to prior imaging. Minimal stepwise anterolisthesis at C3-4, C4-5 and C5-6. Skull base and vertebrae: No evidence of acute cervical spine fracture or traumatic subluxation. Soft tissues and spinal canal: No prevertebral fluid or swelling. No visible canal hematoma. Disc levels:  Multilevel spondylosis with disc space narrowing, endplate osteophytes and uncinate spurring, most advanced at C5-6 and C6-7. There is multilevel facet arthropathy. No large disc herniation or high-grade foraminal narrowing demonstrated. There is multilevel foraminal narrowing which appears worst on the right at C3-4 and C4-5 and on the left at C5-6. Upper chest: Emphysematous changes at the lung apices. Other: Bilateral carotid atherosclerosis. Previous right shoulder reverse arthroplasty. IMPRESSION: 1. No evidence of acute cervical spine fracture, traumatic subluxation or static signs of instability. 2. Multilevel cervical spondylosis as described. Electronically Signed   By: Elsie Perone M.D.   On: 12/27/2023 17:18   CT Head Wo Contrast Result Date: 12/27/2023 CLINICAL DATA:  Head trauma, minor (Age >= 65y).  Fall. EXAM: CT HEAD WITHOUT CONTRAST TECHNIQUE: Contiguous axial images were obtained from the base of the skull through the vertex without intravenous contrast. RADIATION DOSE REDUCTION: This exam was performed according to the departmental dose-optimization program which includes automated exposure control, adjustment of the mA and/or kV according to patient size and/or use  of iterative reconstruction technique. COMPARISON:  09/15/2020 FINDINGS: Brain: There is atrophy and chronic small vessel disease changes. Old left thalamic lacunar infarct. No acute intracranial abnormality. Specifically, no hemorrhage, hydrocephalus, mass lesion, acute infarction, or significant intracranial injury. Vascular: No hyperdense vessel or unexpected calcification. Skull: No acute calvarial abnormality. Sinuses/Orbits: No acute findings Other: None IMPRESSION: Atrophy, chronic microvascular disease. No acute intracranial abnormality. Old left thalamic lacunar infarct. Electronically Signed   By: Franky Crease M.D.   On: 12/27/2023 17:16    Microbiology: Results for orders placed or performed during the hospital  encounter of 12/27/23  Respiratory (~20 pathogens) panel by PCR     Status: None   Collection Time: 12/28/23 11:17 AM   Specimen: Nasopharyngeal Swab; Respiratory  Result Value Ref Range Status   Adenovirus NOT DETECTED NOT DETECTED Final   Coronavirus 229E NOT DETECTED NOT DETECTED Final    Comment: (NOTE) The Coronavirus on the Respiratory Panel, DOES NOT test for the novel  Coronavirus (2019 nCoV)    Coronavirus HKU1 NOT DETECTED NOT DETECTED Final   Coronavirus NL63 NOT DETECTED NOT DETECTED Final   Coronavirus OC43 NOT DETECTED NOT DETECTED Final   Metapneumovirus NOT DETECTED NOT DETECTED Final   Rhinovirus / Enterovirus NOT DETECTED NOT DETECTED Final   Influenza A NOT DETECTED NOT DETECTED Final   Influenza B NOT DETECTED NOT DETECTED Final   Parainfluenza Virus 1 NOT DETECTED NOT DETECTED Final   Parainfluenza Virus 2 NOT DETECTED NOT DETECTED Final   Parainfluenza Virus 3 NOT DETECTED NOT DETECTED Final   Parainfluenza Virus 4 NOT DETECTED NOT DETECTED Final   Respiratory Syncytial Virus NOT DETECTED NOT DETECTED Final   Bordetella pertussis NOT DETECTED NOT DETECTED Final   Bordetella Parapertussis NOT DETECTED NOT DETECTED Final   Chlamydophila pneumoniae NOT DETECTED NOT DETECTED Final   Mycoplasma pneumoniae NOT DETECTED NOT DETECTED Final    Comment: Performed at [facility], [address], [place], [place] [address]    Labs: CBC: Recent Labs  Lab 01/13/24 2158  WBC 7.4  HGB 14.4  HCT 43.2  MCV 84.5  PLT 318   Basic Metabolic Panel: Recent Labs  Lab 01/13/24 2158 01/14/24 0809 01/15/24 0406 01/16/24 0407 01/17/24 0342  NA 129* 129* 129* 129* 132*  K 2.5* 3.0* 3.3* 3.5 3.7  CL 80* 81* 87* 89* 90*  CO2 32 37* 32 31 32  GLUCOSE 133* 147* 113* 138* 111*  BUN 28* 22 19 19 18   CREATININE 1.33* 1.12* 0.94 0.80 0.81  CALCIUM  8.8* 8.6* 8.4* 9.0 9.2   Liver Function Tests: Recent Labs  Lab 01/15/24 0406 01/16/24 0407 01/17/24 0342  AST 19  19 17   ALT 11 12 11   ALKPHOS 173* 174* 162*  BILITOT 0.8 0.4 0.8  PROT 5.8* 6.0* 5.8*  ALBUMIN  2.8* 2.8* 3.0*   CBG: No results for input(s): GLUCAP in the last 168 hours.  Discharge time spent: greater than 30 minutes.  Signed: Adriana DELENA Grams, MD [facility] 01/17/2024

## 2024-01-17 NOTE — TOC Transition Note (Signed)
 Transition of Care [facility]) - Discharge Note  Patient Details  Name: Virginia Young MRN: [id number] Date of Birth: 02-20-1941  [facility] CM/SW Contact:  Nena LITTIE Coffee, RN Phone Number: 01/17/2024, 12:15 PM  Clinical Narrative:    Pt to dc to [facility]. [facility] to transport via wc. Nurse and daughter, Holley, updated.   Final next level of care: Skilled Nursing Facility Barriers to Discharge: Barriers Resolved  Patient Goals and CMS Choice Patient states their goals for this hospitalization and ongoing recovery are:: Agreeable to SNF CMS Medicare.gov Compare Post Acute Care list provided to:: Patient Choice offered to / list presented to : Patient [place] ownership interest in [facility].provided to:: Patient   Discharge Placement:  Discharge Plan and Services Additional resources added to the After Visit Summary for     Discharge Planning Services: CM Consult    Social Drivers of Health (SDOH) Interventions SDOH Screenings   Food Insecurity: No Food Insecurity (01/14/2024)  Housing: Low Risk  (01/14/2024)  Transportation Needs: No Transportation Needs (01/14/2024)  Utilities: Not At Risk (01/14/2024)  Alcohol Screen: Low Risk  (03/13/2022)  Depression (PHQ2-9): Low Risk  (03/13/2022)  Financial Resource Strain: Low Risk  (03/13/2022)  Physical Activity: Inactive (03/13/2022)  Social Connections: Moderately Isolated (01/14/2024)  Stress: No Stress Concern Present (03/13/2022)  Tobacco Use: Low Risk  (01/13/2024)   Readmission Risk Interventions    01/14/2024   10:55 AM 12/28/2023    7:37 PM  Readmission Risk Prevention Plan  Post Dischage Appt  Complete  Medication Screening  Complete  Transportation Screening Complete Complete  PCP or Specialist Appt within 3-5 Days Not Complete   HRI or Home Care Consult Complete   Social Work Consult for Recovery Care Planning/Counseling Complete   Palliative Care Screening Not Applicable    Medication Review Oceanographer) Complete

## 2024-01-17 NOTE — Progress Notes (Signed)
 Pt is very red around her vulva and anal area, as well as buttocks. Prescribed Nystatin  Ointment applied generously. Sacral Foam applied to buttocks for protective measures.

## 2024-04-24 ENCOUNTER — Emergency Department (HOSPITAL_COMMUNITY)

## 2024-04-24 ENCOUNTER — Other Ambulatory Visit: Payer: Self-pay

## 2024-04-24 ENCOUNTER — Emergency Department (HOSPITAL_COMMUNITY)
Admission: EM | Admit: 2024-04-24 | Discharge: 2024-04-24 | Disposition: A | Discharge status: Home / Self Care | Attending: Emergency Medicine | Admitting: Emergency Medicine

## 2024-04-24 ENCOUNTER — Encounter (HOSPITAL_COMMUNITY): Payer: Self-pay | Admitting: Emergency Medicine

## 2024-04-24 DIAGNOSIS — M25551 Pain in right hip: Secondary | ICD-10-CM | POA: Diagnosis present

## 2024-04-24 DIAGNOSIS — F0390 Unspecified dementia without behavioral disturbance: Secondary | ICD-10-CM | POA: Insufficient documentation

## 2024-04-24 DIAGNOSIS — I10 Essential (primary) hypertension: Secondary | ICD-10-CM | POA: Insufficient documentation

## 2024-04-24 DIAGNOSIS — W19XXXA Unspecified fall, initial encounter: Secondary | ICD-10-CM | POA: Diagnosis not present

## 2024-04-24 DIAGNOSIS — M25521 Pain in right elbow: Secondary | ICD-10-CM | POA: Diagnosis not present

## 2024-04-24 DIAGNOSIS — S7001XA Contusion of right hip, initial encounter: Secondary | ICD-10-CM

## 2024-04-24 DIAGNOSIS — N3000 Acute cystitis without hematuria: Secondary | ICD-10-CM

## 2024-04-24 LAB — URINALYSIS, ROUTINE W REFLEX MICROSCOPIC
Bilirubin Urine: NEGATIVE
Glucose, UA: NEGATIVE mg/dL
Ketones, ur: NEGATIVE mg/dL
Nitrite: NEGATIVE
Protein, ur: NEGATIVE mg/dL
Specific Gravity, Urine: 1.012 (ref 1.005–1.030)
WBC, UA: >50 WBC/hpf (ref 0–5)
pH: 6 (ref 5.0–8.0)

## 2024-04-24 LAB — BASIC METABOLIC PANEL WITH GFR
Anion gap: 9 (ref 5–15)
BUN: 14 mg/dL (ref 8–23)
CO2: 31 mmol/L (ref 22–32)
Calcium: 9.3 mg/dL (ref 8.9–10.3)
Chloride: 99 mmol/L (ref 98–111)
Creatinine, Ser: 0.92 mg/dL (ref 0.44–1.00)
GFR, Estimated: >60 mL/min (ref >60)
Glucose, Bld: 127 mg/dL — ABNORMAL HIGH (ref 70–99)
Potassium: 4.3 mmol/L (ref 3.5–5.1)
Sodium: 139 mmol/L (ref 135–145)

## 2024-04-24 LAB — CBC WITH DIFFERENTIAL/PLATELET
Abs Immature Granulocytes: 0.03 K/uL (ref 0.00–0.07)
Basophils Absolute: 0.1 K/uL (ref 0.0–0.1)
Basophils Relative: 1 %
Eosinophils Absolute: 0.2 K/uL (ref 0.0–0.5)
Eosinophils Relative: 3 %
HCT: 46 % (ref 36.0–46.0)
Hemoglobin: 14.8 g/dL (ref 12.0–15.0)
Immature Granulocytes: 0 %
Lymphocytes Relative: 32 %
Lymphs Abs: 2.5 K/uL (ref 0.7–4.0)
MCH: 28.3 pg (ref 26.0–34.0)
MCHC: 32.2 g/dL (ref 30.0–36.0)
MCV: 88 fL (ref 80.0–100.0)
Monocytes Absolute: 0.9 K/uL (ref 0.1–1.0)
Monocytes Relative: 12 %
Neutro Abs: 4.2 K/uL (ref 1.7–7.7)
Neutrophils Relative %: 52 %
Platelets: 222 K/uL (ref 150–400)
RBC: 5.23 MIL/uL — ABNORMAL HIGH (ref 3.87–5.11)
RDW: 14.5 % (ref 11.5–15.5)
WBC: 8 K/uL (ref 4.0–10.5)
nRBC: 0 % (ref 0.0–0.2)

## 2024-04-24 MED ORDER — ONDANSETRON HCL 4 MG/2ML IJ SOLN
4.0000 mg | Freq: Once | INTRAMUSCULAR | Status: AC
  Administered 2024-04-24: 4 mg via INTRAVENOUS
  Filled 2024-04-24: qty 2

## 2024-04-24 MED ORDER — MORPHINE SULFATE (PF) 4 MG/ML IV SOLN
4.0000 mg | Freq: Once | INTRAVENOUS | Status: AC
  Administered 2024-04-24: 4 mg via INTRAVENOUS
  Filled 2024-04-24: qty 1

## 2024-04-24 MED ORDER — SODIUM CHLORIDE 0.9 % IV SOLN
1.0000 g | Freq: Once | INTRAVENOUS | Status: AC
  Administered 2024-04-24: 1 g via INTRAVENOUS
  Filled 2024-04-24: qty 10

## 2024-04-24 MED ORDER — CEPHALEXIN 500 MG PO CAPS: 500.0000 mg | ORAL_CAPSULE | Freq: Three times a day (TID) | ORAL | 0 refills | Status: DC

## 2024-04-24 NOTE — Discharge Instructions (Signed)
 Begin taking Keflex as prescribed.  Follow-up with primary doctor if symptoms are not improving in the next few days.

## 2024-04-24 NOTE — ED Provider Notes (Signed)
 [place] [facility]ERGENCY DEPARTMENT AT [facility] Provider Note   CSN: [id number] Arrival date & time: 04/24/24  9945     Patient presents with: Virginia Young Iris KATHEE Donavon is a 83 y.o. female.   Patient is an 83 year old female with history of hyperlipidemia, cardiomyopathy, atrial fibrillation, hypertension, dementia.  Patient sent from her extended care facility for evaluation of a fall.  She had an unwitnessed fall this evening and is now complaining of pain in her right pelvis and low back.  She also complains of pain in her right elbow.  She adds little additional history secondary to dementia and has little recollection of the injury.       Prior to Admission medications   Medication Sig Start Date End Date Taking? Authorizing Provider  acetaminophen  (TYLENOL ) 325 MG tablet Take 650 mg by mouth 3 (three) times daily. 12/11/23   [provider]  Cholecalciferol  (VITAMIN D3) 1000 units CAPS Take 1 capsule by mouth daily.    [provider]  diclofenac  Sodium (VOLTAREN ) 1 % GEL Apply 2 g topically 2 (two) times daily. 12/14/23   [provider]  divalproex  (DEPAKOTE  ER) 250 MG 24 hr tablet Take 1 tablet (250 mg total) by mouth daily. 09/23/20   Samtani, Jai-Gurmukh, MD  docusate sodium  (COLACE) 100 MG capsule Take 100 mg by mouth daily.    [provider]  DULoxetine  (CYMBALTA ) 60 MG capsule Take 60 mg by mouth daily. 12/21/23   [provider]  ELIQUIS  5 MG TABS tablet TAKE 1 TABLET BY MOUTH EVERY 12 HOURS 11/16/20   Hawks, Christy A, FNP  fenofibrate  (TRICOR ) 48 MG tablet Take 48 mg by mouth daily. 09/25/21   [provider]  folic acid  (FOLVITE ) 1 MG tablet Take 1 mg by mouth daily.    [provider]  furosemide  (LASIX ) 40 MG tablet Take 40 mg by mouth daily. 12/16/23   [provider]  ipratropium-albuterol  (DUONEB) 0.5-2.5 (3) MG/3ML SOLN Take 3 mLs by nebulization 3 (three) times daily. 04/04/23   Johnson,  Clanford L, MD  memantine  (NAMENDA ) 5 MG tablet Take 5 mg by mouth 2 (two) times daily. 12/16/23   [provider]  metolazone (ZAROXOLYN) 2.5 MG tablet Take 2.5 mg by mouth daily. 01/01/24   [provider]  metoprolol  tartrate (LOPRESSOR ) 25 MG tablet Take 12.5 mg by mouth 3 (three) times daily. 8am,2pm,and 8pm 12/16/23   [provider]  midodrine  (PROAMATINE ) 2.5 MG tablet Take 2.5 mg by mouth 3 (three) times daily. 730am,1130am,430pm 12/16/23   [provider]  Multiple Vitamin (MULTIVITAMIN) tablet Take 1 tablet by mouth daily.    [provider]  NYAMYC  powder Apply 1 Application topically. 12/09/23   [provider]  OXYGEN  Inhale into the lungs. 2 liters PRN - anytime when needed ( cardio rx'd)    [provider]  polyethylene glycol (MIRALAX  / GLYCOLAX ) 17 g packet Take 17 g by mouth daily as needed for mild constipation. 04/04/23   Johnson, Clanford L, MD  QUEtiapine  (SEROQUEL ) 50 MG tablet Take 50 mg by mouth 2 (two) times daily. 12/16/23   [provider]  sodium chloride  1 g tablet Take 1 g by mouth daily.    [provider]    Allergies: Bactrim  [sulfamethoxazole -trimethoprim ], Hydrocortisone, Lipitor [atorvastatin  calcium ], and Lisinopril     Review of Systems  All other systems reviewed and are negative.   Updated Vital Signs BP (!) 195/90 (BP Location: Left Arm)  Pulse 79   Temp 97.9 F (36.6 C) (Axillary)   Resp 20   Ht 5' 11 (1.803 m)   Wt 104 kg   SpO2 91%   BMI 31.98 kg/m   Physical Exam Vitals and nursing note reviewed.  Constitutional:      General: She is not in acute distress.    Appearance: She is well-developed. She is not diaphoretic.  HENT:     Head: Normocephalic and atraumatic.  Cardiovascular:     Rate and Rhythm: Normal rate and regular rhythm.     Heart sounds: No murmur heard.    No friction rub. No gallop.  Pulmonary:     Effort: Pulmonary effort is normal. No  respiratory distress.     Breath sounds: Normal breath sounds. No wheezing.  Abdominal:     General: Bowel sounds are normal. There is no distension.     Palpations: Abdomen is soft.     Tenderness: There is no abdominal tenderness.  Musculoskeletal:        General: Normal range of motion.     Cervical back: Normal range of motion and neck supple.     Comments: There is tenderness to palpation over the anterior pelvis and hip on the right.  There is no shortening or rotation of the leg.  DP pulses and motor and sensation are intact to the distal foot  Skin:    General: Skin is warm and dry.  Neurological:     General: No focal deficit present.     Mental Status: She is alert and oriented to person, place, and time.     (all labs ordered are listed, but only abnormal results are displayed) Labs Reviewed  BASIC METABOLIC PANEL WITH GFR  CBC WITH DIFFERENTIAL/PLATELET  URINALYSIS, ROUTINE W REFLEX MICROSCOPIC    EKG: None  Radiology: No results found.   Procedures   Medications Ordered in the ED - No data to display                                  Medical Decision Making Amount and/or Complexity of Data Reviewed Labs: ordered. Radiology: ordered.  Risk Prescription drug management.   Patient is an 83 year old female presenting with complaints of pain in her right hip after a fall.  Patient arrives with stable vital signs and is afebrile.  Physical examination reveals some tenderness to the right hip, but no shortening or rotation of the leg.  The distal extremity is neurovascularly intact.  Laboratory studies obtained including CBC, metabolic panel, and urinalysis.  There is evidence for UTI in the urine sample.  CT scan of the lumbar spine and pelvis obtained showing a possible subtle acetabular fracture of the left pelvis.  X-ray of the right elbow negative.  Patient given IV morphine  and Zofran  for pain and nausea.  She was also given Rocephin  for UTI.  The  patient is having no left hip discomfort and I doubt the CT finding on the left hip is acute.  There is no evidence for fracture on the right.  I feel as though patient can safely be discharged.     Final diagnoses:  None    ED Discharge Orders     None          Geroldine Berg, MD 04/24/24 [phone]

## 2024-04-24 NOTE — ED Triage Notes (Signed)
 Pt bib EMS from [facility] of [place] after an unwitnessed fall. Pt c/o pain to back and R. Hip, and R elbow. Pt with dementia at baseline. Facility reports hallucinations earlier in day yesterday and are concerned for possible UTI.

## 2024-04-25 ENCOUNTER — Emergency Department (HOSPITAL_COMMUNITY)

## 2024-04-25 ENCOUNTER — Emergency Department (HOSPITAL_COMMUNITY)
Admission: EM | Admit: 2024-04-25 | Discharge: 2024-04-26 | Disposition: A | Discharge status: Home / Self Care | Source: Skilled Nursing Facility | Attending: Emergency Medicine | Admitting: Emergency Medicine

## 2024-04-25 ENCOUNTER — Other Ambulatory Visit: Payer: Self-pay

## 2024-04-25 DIAGNOSIS — W19XXXA Unspecified fall, initial encounter: Secondary | ICD-10-CM | POA: Insufficient documentation

## 2024-04-25 DIAGNOSIS — S0083XA Contusion of other part of head, initial encounter: Secondary | ICD-10-CM | POA: Insufficient documentation

## 2024-04-25 DIAGNOSIS — R451 Restlessness and agitation: Secondary | ICD-10-CM | POA: Insufficient documentation

## 2024-04-25 DIAGNOSIS — S51811A Laceration without foreign body of right forearm, initial encounter: Secondary | ICD-10-CM | POA: Insufficient documentation

## 2024-04-25 DIAGNOSIS — F0390 Unspecified dementia without behavioral disturbance: Secondary | ICD-10-CM | POA: Insufficient documentation

## 2024-04-25 DIAGNOSIS — Z7901 Long term (current) use of anticoagulants: Secondary | ICD-10-CM | POA: Diagnosis not present

## 2024-04-25 DIAGNOSIS — I10 Essential (primary) hypertension: Secondary | ICD-10-CM | POA: Diagnosis not present

## 2024-04-25 DIAGNOSIS — R410 Disorientation, unspecified: Secondary | ICD-10-CM | POA: Insufficient documentation

## 2024-04-25 DIAGNOSIS — I482 Chronic atrial fibrillation, unspecified: Secondary | ICD-10-CM | POA: Diagnosis not present

## 2024-04-25 DIAGNOSIS — Z79899 Other long term (current) drug therapy: Secondary | ICD-10-CM | POA: Insufficient documentation

## 2024-04-25 DIAGNOSIS — R400 Somnolence: Secondary | ICD-10-CM | POA: Insufficient documentation

## 2024-04-25 DIAGNOSIS — S51819A Laceration without foreign body of unspecified forearm, initial encounter: Secondary | ICD-10-CM

## 2024-04-25 LAB — COMPREHENSIVE METABOLIC PANEL WITH GFR
ALT: 17 U/L (ref 0–44)
AST: 33 U/L (ref 15–41)
Albumin: 4.1 g/dL (ref 3.5–5.0)
Alkaline Phosphatase: 95 U/L (ref 38–126)
Anion gap: 10 (ref 5–15)
BUN: 22 mg/dL (ref 8–23)
CO2: 31 mmol/L (ref 22–32)
Calcium: 9.2 mg/dL (ref 8.9–10.3)
Chloride: 94 mmol/L — ABNORMAL LOW (ref 98–111)
Creatinine, Ser: 1.34 mg/dL — ABNORMAL HIGH (ref 0.44–1.00)
GFR, Estimated: 39 mL/min — ABNORMAL LOW (ref >60)
Glucose, Bld: 124 mg/dL — ABNORMAL HIGH (ref 70–99)
Potassium: 4 mmol/L (ref 3.5–5.1)
Sodium: 136 mmol/L (ref 135–145)
Total Bilirubin: 0.6 mg/dL (ref 0.0–1.2)
Total Protein: 6.9 g/dL (ref 6.5–8.1)

## 2024-04-25 LAB — CBC WITH DIFFERENTIAL/PLATELET
Abs Immature Granulocytes: 0.04 K/uL (ref 0.00–0.07)
Basophils Absolute: 0.1 K/uL (ref 0.0–0.1)
Basophils Relative: 1 %
Eosinophils Absolute: 0.1 K/uL (ref 0.0–0.5)
Eosinophils Relative: 1 %
HCT: 45.2 % (ref 36.0–46.0)
Hemoglobin: 14.2 g/dL (ref 12.0–15.0)
Immature Granulocytes: 0 %
Lymphocytes Relative: 30 %
Lymphs Abs: 3.2 K/uL (ref 0.7–4.0)
MCH: 28 pg (ref 26.0–34.0)
MCHC: 31.4 g/dL (ref 30.0–36.0)
MCV: 89.2 fL (ref 80.0–100.0)
Monocytes Absolute: 1.1 K/uL — ABNORMAL HIGH (ref 0.1–1.0)
Monocytes Relative: 10 %
Neutro Abs: 6.2 K/uL (ref 1.7–7.7)
Neutrophils Relative %: 58 %
Platelets: 234 K/uL (ref 150–400)
RBC: 5.07 MIL/uL (ref 3.87–5.11)
RDW: 14.3 % (ref 11.5–15.5)
WBC: 10.7 K/uL — ABNORMAL HIGH (ref 4.0–10.5)
nRBC: 0 % (ref 0.0–0.2)

## 2024-04-25 MED ORDER — LACTATED RINGERS IV BOLUS
500.0000 mL | Freq: Once | INTRAVENOUS | Status: AC
  Administered 2024-04-26: 500 mL via INTRAVENOUS

## 2024-04-25 MED ORDER — LORAZEPAM 2 MG/ML IJ SOLN
1.0000 mg | Freq: Once | INTRAMUSCULAR | Status: AC
  Administered 2024-04-25: 1 mg via INTRAMUSCULAR
  Filled 2024-04-25: qty 1

## 2024-04-25 NOTE — ED Triage Notes (Signed)
 Pt BIB by EMS from [facility]. Pt was an unwitnessed fall, on Eliquis . Hemotoma noted to the rt eyebrow. Pt w/ hx of dementia, denies pain. Skin tear reported by EMS to pts rt arm. Pt sats 88-90% on RA.

## 2024-04-25 NOTE — ED Provider Notes (Signed)
 [place] EMERGENCY DEPARTMENT AT [facility]  Provider Note  CSN: [id number] Arrival date & time: 04/25/24 2153  History Chief Complaint  Patient presents with   Virginia Young is a 83 y.o. female history of hyperlipidemia, cardiomyopathy, atrial fibrillation, hypertension, dementia. Patient sent from her extended care facility for evaluation of a fall. She had an unwitnessed fall this evening. Given Ativan prior to my arrival for agitation. She is unable to provide any history. She was apparently hypoxic on RA on arrival. She was here for same yesterday, had imaging suggestive of acetabular fracture although not complaining of pain on that side and UA concerning for UTI.    Home Medications Prior to Admission medications   Medication Sig Start Date End Date Taking? Authorizing Provider  acetaminophen  (TYLENOL ) 325 MG tablet Take 650 mg by mouth 3 (three) times daily. 12/11/23   [provider]  cephALEXin  (KEFLEX ) 500 MG capsule Take 1 capsule (500 mg total) by mouth 3 (three) times daily. 04/24/24   Geroldine Berg, MD  Cholecalciferol  (VITAMIN D3) 1000 units CAPS Take 1 capsule by mouth daily.    [provider]  diclofenac  Sodium (VOLTAREN ) 1 % GEL Apply 2 g topically 2 (two) times daily. 12/14/23   [provider]  divalproex  (DEPAKOTE  ER) 250 MG 24 hr tablet Take 1 tablet (250 mg total) by mouth daily. 09/23/20   Samtani, Jai-Gurmukh, MD  docusate sodium  (COLACE) 100 MG capsule Take 100 mg by mouth daily.    [provider]  DULoxetine  (CYMBALTA ) 60 MG capsule Take 60 mg by mouth daily. 12/21/23   [provider]  ELIQUIS  5 MG TABS tablet TAKE 1 TABLET BY MOUTH EVERY 12 HOURS 11/16/20   Hawks, Christy A, FNP  fenofibrate  (TRICOR ) 48 MG tablet Take 48 mg by mouth daily. 09/25/21   [provider]  folic acid  (FOLVITE ) 1 MG tablet Take 1 mg by mouth daily.    [provider]  furosemide  (LASIX ) 40 MG tablet  Take 40 mg by mouth daily. 12/16/23   [provider]  ipratropium-albuterol  (DUONEB) 0.5-2.5 (3) MG/3ML SOLN Take 3 mLs by nebulization 3 (three) times daily. 04/04/23   Johnson, Clanford L, MD  memantine  (NAMENDA ) 5 MG tablet Take 5 mg by mouth 2 (two) times daily. 12/16/23   [provider]  metolazone (ZAROXOLYN) 2.5 MG tablet Take 2.5 mg by mouth daily. 01/01/24   [provider]  metoprolol  tartrate (LOPRESSOR ) 25 MG tablet Take 12.5 mg by mouth 3 (three) times daily. 8am,2pm,and 8pm 12/16/23   [provider]  midodrine  (PROAMATINE ) 2.5 MG tablet Take 2.5 mg by mouth 3 (three) times daily. 730am,1130am,430pm 12/16/23   [provider]  Multiple Vitamin (MULTIVITAMIN) tablet Take 1 tablet by mouth daily.    [provider]  NYAMYC  powder Apply 1 Application topically. 12/09/23   [provider]  OXYGEN  Inhale into the lungs. 2 liters PRN - anytime when needed ( cardio rx'd)    [provider]  polyethylene glycol (MIRALAX  / GLYCOLAX ) 17 g packet Take 17 g by mouth daily as needed for mild constipation. 04/04/23   Johnson, Clanford L, MD  QUEtiapine  (SEROQUEL ) 50 MG tablet Take 50 mg by mouth 2 (two) times daily. 12/16/23   [provider]  sodium chloride  1 g tablet Take 1 g by mouth daily.    [provider]     Allergies    Bactrim  [sulfamethoxazole -trimethoprim ], Hydrocortisone, Lipitor [atorvastatin   calcium ], and Lisinopril    Review of Systems   Review of Systems Please see HPI for pertinent positives and negatives  Physical Exam BP 114/62   Pulse 87   Temp 98.3 F (36.8 C) (Oral)   Resp 18   Ht 5' 11 (1.803 m)   Wt 104 kg   SpO2 100%   BMI 31.98 kg/m   Physical Exam Vitals and nursing note reviewed.  Constitutional:      Appearance: She is obese.     Comments: somnolent  HENT:     Head: Normocephalic.     Comments: Hematoma R forehead    Nose: Nose normal.     Mouth/Throat:      Mouth: Mucous membranes are moist.  Eyes:     Extraocular Movements: Extraocular movements intact.     Conjunctiva/sclera: Conjunctivae normal.  Cardiovascular:     Rate and Rhythm: Normal rate.  Pulmonary:     Effort: Pulmonary effort is normal.     Breath sounds: Normal breath sounds.  Abdominal:     General: Abdomen is flat.     Palpations: Abdomen is soft.     Tenderness: There is no abdominal tenderness.  Musculoskeletal:        General: No swelling, tenderness or deformity.     Cervical back: Neck supple.  Skin:    General: Skin is warm and dry.     Comments: Skin tear on R forearm  Neurological:     Mental Status: She is disoriented.  Psychiatric:        Mood and Affect: Mood normal.     ED Results / Procedures / Treatments   EKG EKG Interpretation Date/Time:  Sunday April 25 2024 23:07:45 EST Ventricular Rate:  89 PR Interval:    QRS Duration:  87 QT Interval:  382 QTC Calculation: 457 R Axis:   -28  Text Interpretation: Atrial fibrillation Borderline left axis deviation Low voltage, precordial leads Borderline repolarization abnormality No significant change since last tracing Confirmed by Roselyn Dunnings [phone]) on 04/25/2024 11:13:49 PM  Procedures Procedures  Medications Ordered in the ED Medications  LORazepam (ATIVAN) injection 1 mg (1 mg Intramuscular Given 04/25/24 2232)  lactated ringers  bolus 500 mL (0 mLs Intravenous Stopped 04/26/24 0051)    Initial Impression and Plan  Patient here for another unwitnessed fall, seem several times recently for same. Remains on Eliquis  for Afib. Will check basic labs and imaging.   ED Course   Clinical Course as of 04/26/24 0408  [place] Apr 25, 2024  2331 CBC is unremarkable.  [CS]  2332 CMP with Cr mildly increased from previous, will give IVF bolus. Otherwise unremarkable.  [CS]  Mon Apr 26, 2024  0009 I personally viewed the images from radiology studies and agree with radiologist interpretation: Imaging  is neg for acute intracranial or other bony injuries.  [CS]  0113 Patient still sleeping soundly with oxygen  requirement. She has oxygen  PRN at SNF. Suspect her continued somnolence is from Ativan given for agitation. Will continue to monitor in the ED.  [CS]  0407 Patient more awake now and back to baseline. Transient hypotension documented was while she was asleep and lying on her side and not felt to be an accurate reading. Stable for discharge back to SNF.  [CS]    Clinical Course User Index [CS] Roselyn Dunnings NOVAK, MD     MDM Rules/Calculators/A&P Medical Decision Making Given presenting complaint, I considered that admission might be necessary. After review of results from ED  lab and/or imaging studies, admission to the hospital is not indicated at this time.    Problems Addressed: Contusion of face, initial encounter: acute illness or injury Fall, initial encounter: acute illness or injury Skin tear of forearm without complication, initial encounter: acute illness or injury  Amount and/or Complexity of Data Reviewed Labs: ordered. Decision-making details documented in ED Course. Radiology: ordered and independent interpretation performed. Decision-making details documented in ED Course. ECG/medicine tests: ordered and independent interpretation performed. Decision-making details documented in ED Course.  Risk Prescription drug management. Decision regarding hospitalization.     Final Clinical Impression(s) / ED Diagnoses Final diagnoses:  Fall, initial encounter  Contusion of face, initial encounter  Skin tear of forearm without complication, initial encounter    Rx / DC Orders ED Discharge Orders     None        Roselyn Carlin NOVAK, MD 04/26/24 [phone]

## 2024-04-25 NOTE — ED Notes (Signed)
 Pt pulling off vital sign equipment, combative and refusing blood work at this time. Pt trying to get out of bed and is not redirectable. Zammitt MD notified, medication orders placed.

## 2024-04-26 DIAGNOSIS — S0083XA Contusion of other part of head, initial encounter: Secondary | ICD-10-CM | POA: Diagnosis not present

## 2024-04-26 NOTE — ED Notes (Signed)
 Pts skin tear cleaned, covered w/ nonstick gauze and tegaderm

## 2024-04-26 NOTE — ED Notes (Signed)
 Second attempt to call facility for discharge report without success

## 2024-04-26 NOTE — ED Notes (Addendum)
 Pt sats 98-100% on 3L. Attempted to titrate off of O2, pt dropped 85% on RA. 3L reapplied, MD notified.

## 2024-04-26 NOTE — ED Notes (Signed)
 Pt BP 81/65, Roselyn MD aware.

## 2024-04-26 NOTE — ED Notes (Signed)
 Attempted to call Facility for discharge without success.

## 2024-04-27 ENCOUNTER — Other Ambulatory Visit: Payer: Self-pay

## 2024-04-27 ENCOUNTER — Observation Stay (HOSPITAL_COMMUNITY)
Admission: EM | Admit: 2024-04-27 | Discharge: 2024-04-28 | Disposition: A | Discharge status: Nursing Home (Includes ICF) | Source: Skilled Nursing Facility | Attending: Family Medicine | Admitting: Family Medicine

## 2024-04-27 ENCOUNTER — Encounter (HOSPITAL_COMMUNITY): Payer: Self-pay

## 2024-04-27 ENCOUNTER — Emergency Department (HOSPITAL_COMMUNITY)

## 2024-04-27 ENCOUNTER — Observation Stay (HOSPITAL_COMMUNITY)

## 2024-04-27 DIAGNOSIS — F321 Major depressive disorder, single episode, moderate: Secondary | ICD-10-CM | POA: Diagnosis present

## 2024-04-27 DIAGNOSIS — G9340 Encephalopathy, unspecified: Principal | ICD-10-CM | POA: Diagnosis present

## 2024-04-27 DIAGNOSIS — K5730 Diverticulosis of large intestine without perforation or abscess without bleeding: Secondary | ICD-10-CM | POA: Insufficient documentation

## 2024-04-27 DIAGNOSIS — Z7901 Long term (current) use of anticoagulants: Secondary | ICD-10-CM | POA: Insufficient documentation

## 2024-04-27 DIAGNOSIS — I110 Hypertensive heart disease with heart failure: Secondary | ICD-10-CM | POA: Insufficient documentation

## 2024-04-27 DIAGNOSIS — I5022 Chronic systolic (congestive) heart failure: Secondary | ICD-10-CM | POA: Diagnosis present

## 2024-04-27 DIAGNOSIS — F03B18 Unspecified dementia, moderate, with other behavioral disturbance: Secondary | ICD-10-CM | POA: Diagnosis not present

## 2024-04-27 DIAGNOSIS — F0390 Unspecified dementia without behavioral disturbance: Secondary | ICD-10-CM | POA: Diagnosis not present

## 2024-04-27 DIAGNOSIS — I4821 Permanent atrial fibrillation: Secondary | ICD-10-CM

## 2024-04-27 DIAGNOSIS — I4891 Unspecified atrial fibrillation: Secondary | ICD-10-CM | POA: Diagnosis present

## 2024-04-27 DIAGNOSIS — R4182 Altered mental status, unspecified: Secondary | ICD-10-CM | POA: Diagnosis present

## 2024-04-27 DIAGNOSIS — I700 Atherosclerosis of aorta: Secondary | ICD-10-CM | POA: Insufficient documentation

## 2024-04-27 DIAGNOSIS — E119 Type 2 diabetes mellitus without complications: Secondary | ICD-10-CM | POA: Diagnosis not present

## 2024-04-27 DIAGNOSIS — J90 Pleural effusion, not elsewhere classified: Secondary | ICD-10-CM | POA: Insufficient documentation

## 2024-04-27 DIAGNOSIS — K8020 Calculus of gallbladder without cholecystitis without obstruction: Secondary | ICD-10-CM | POA: Diagnosis not present

## 2024-04-27 DIAGNOSIS — I10 Essential (primary) hypertension: Secondary | ICD-10-CM | POA: Diagnosis present

## 2024-04-27 LAB — URINALYSIS, W/ REFLEX TO CULTURE (INFECTION SUSPECTED)
Bilirubin Urine: NEGATIVE
Glucose, UA: NEGATIVE mg/dL
Ketones, ur: NEGATIVE mg/dL
Leukocytes,Ua: NEGATIVE
Nitrite: NEGATIVE
Protein, ur: NEGATIVE mg/dL
RBC / HPF: >50 RBC/hpf (ref 0–5)
Specific Gravity, Urine: 1.008 (ref 1.005–1.030)
pH: 7 (ref 5.0–8.0)

## 2024-04-27 LAB — COMPREHENSIVE METABOLIC PANEL WITH GFR
ALT: 33 U/L (ref 0–44)
AST: 83 U/L — ABNORMAL HIGH (ref 15–41)
Albumin: 3.9 g/dL (ref 3.5–5.0)
Alkaline Phosphatase: 95 U/L (ref 38–126)
Anion gap: 9 (ref 5–15)
BUN: 13 mg/dL (ref 8–23)
CO2: 33 mmol/L — ABNORMAL HIGH (ref 22–32)
Calcium: 9.4 mg/dL (ref 8.9–10.3)
Chloride: 98 mmol/L (ref 98–111)
Creatinine, Ser: 0.85 mg/dL (ref 0.44–1.00)
GFR, Estimated: >60 mL/min (ref >60)
Glucose, Bld: 119 mg/dL — ABNORMAL HIGH (ref 70–99)
Potassium: 3.6 mmol/L (ref 3.5–5.1)
Sodium: 141 mmol/L (ref 135–145)
Total Bilirubin: 0.9 mg/dL (ref 0.0–1.2)
Total Protein: 6.9 g/dL (ref 6.5–8.1)

## 2024-04-27 LAB — CBC WITH DIFFERENTIAL/PLATELET
Abs Immature Granulocytes: 0.02 K/uL (ref 0.00–0.07)
Basophils Absolute: 0.1 K/uL (ref 0.0–0.1)
Basophils Relative: 1 %
Eosinophils Absolute: 0.1 K/uL (ref 0.0–0.5)
Eosinophils Relative: 2 %
HCT: 43.6 % (ref 36.0–46.0)
Hemoglobin: 14.2 g/dL (ref 12.0–15.0)
Immature Granulocytes: 0 %
Lymphocytes Relative: 29 %
Lymphs Abs: 2.6 K/uL (ref 0.7–4.0)
MCH: 28.3 pg (ref 26.0–34.0)
MCHC: 32.6 g/dL (ref 30.0–36.0)
MCV: 86.9 fL (ref 80.0–100.0)
Monocytes Absolute: 1 K/uL (ref 0.1–1.0)
Monocytes Relative: 11 %
Neutro Abs: 5.1 K/uL (ref 1.7–7.7)
Neutrophils Relative %: 57 %
Platelets: 229 K/uL (ref 150–400)
RBC: 5.02 MIL/uL (ref 3.87–5.11)
RDW: 14.3 % (ref 11.5–15.5)
WBC: 8.9 K/uL (ref 4.0–10.5)
nRBC: 0 % (ref 0.0–0.2)

## 2024-04-27 LAB — MRSA NEXT GEN BY PCR, NASAL: MRSA by PCR Next Gen: NOT DETECTED

## 2024-04-27 LAB — AMMONIA: Ammonia: 16 umol/L (ref 9–35)

## 2024-04-27 LAB — VALPROIC ACID LEVEL: Valproic Acid Lvl: 11 ug/mL — ABNORMAL LOW (ref 50–100)

## 2024-04-27 MED ORDER — SODIUM CHLORIDE 0.9 % IV SOLN
1.0000 g | Freq: Once | INTRAVENOUS | Status: AC
  Administered 2024-04-27: 1 g via INTRAVENOUS
  Filled 2024-04-27: qty 10

## 2024-04-27 MED ORDER — QUETIAPINE FUMARATE 25 MG PO TABS
25.0000 mg | ORAL_TABLET | Freq: Every day | ORAL | Status: DC
  Administered 2024-04-28: 25 mg via ORAL
  Filled 2024-04-27: qty 1

## 2024-04-27 MED ORDER — IPRATROPIUM-ALBUTEROL 0.5-2.5 (3) MG/3ML IN SOLN: 3.0000 mL | Freq: Three times a day (TID) | RESPIRATORY_TRACT | Status: DC | PRN

## 2024-04-27 MED ORDER — METOPROLOL TARTRATE 25 MG PO TABS
12.5000 mg | ORAL_TABLET | Freq: Three times a day (TID) | ORAL | Status: DC
  Administered 2024-04-27 – 2024-04-28 (×2): 12.5 mg via ORAL
  Filled 2024-04-27 (×3): qty 1

## 2024-04-27 MED ORDER — PROMETHAZINE HCL 12.5 MG PO TABS: 12.5000 mg | ORAL_TABLET | Freq: Four times a day (QID) | ORAL | Status: DC | PRN

## 2024-04-27 MED ORDER — POLYETHYLENE GLYCOL 3350 17 G PO PACK: 17.0000 g | PACK | Freq: Every day | ORAL | Status: DC | PRN

## 2024-04-27 MED ORDER — SODIUM CHLORIDE 0.9 % IV SOLN
2.0000 g | INTRAVENOUS | Status: DC
  Administered 2024-04-28: 2 g via INTRAVENOUS
  Filled 2024-04-27: qty 20

## 2024-04-27 MED ORDER — ACETAMINOPHEN 325 MG PO TABS: 650.0000 mg | ORAL_TABLET | Freq: Four times a day (QID) | ORAL | Status: DC | PRN

## 2024-04-27 MED ORDER — POTASSIUM CHLORIDE 20 MEQ PO PACK
40.0000 meq | PACK | Freq: Once | ORAL | Status: AC
  Administered 2024-04-27: 40 meq via ORAL
  Filled 2024-04-27: qty 2

## 2024-04-27 MED ORDER — HALOPERIDOL LACTATE 5 MG/ML IJ SOLN
2.0000 mg | Freq: Once | INTRAMUSCULAR | Status: AC
  Administered 2024-04-27: 2 mg via INTRAVENOUS
  Filled 2024-04-27: qty 1

## 2024-04-27 MED ORDER — ACETAMINOPHEN 650 MG RE SUPP: 650.0000 mg | Freq: Four times a day (QID) | RECTAL | Status: DC | PRN

## 2024-04-27 MED ORDER — LACTATED RINGERS IV SOLN: INTRAVENOUS | Status: AC

## 2024-04-27 MED ORDER — APIXABAN 5 MG PO TABS: 5.0000 mg | ORAL_TABLET | Freq: Two times a day (BID) | ORAL | Status: DC

## 2024-04-27 MED ORDER — DIVALPROEX SODIUM ER 250 MG PO TB24
250.0000 mg | ORAL_TABLET | Freq: Every day | ORAL | Status: DC
  Administered 2024-04-28: 250 mg via ORAL
  Filled 2024-04-27 (×2): qty 1

## 2024-04-27 MED ORDER — LACTATED RINGERS IV SOLN: INTRAVENOUS | Status: DC

## 2024-04-27 MED ORDER — LACTATED RINGERS IV BOLUS
500.0000 mL | Freq: Once | INTRAVENOUS | Status: AC
  Administered 2024-04-27: 500 mL via INTRAVENOUS

## 2024-04-27 MED ORDER — LORAZEPAM 2 MG/ML IJ SOLN
1.0000 mg | Freq: Once | INTRAMUSCULAR | Status: AC
  Administered 2024-04-27: 1 mg via INTRAVENOUS
  Filled 2024-04-27: qty 1

## 2024-04-27 MED ORDER — LORAZEPAM 2 MG/ML IJ SOLN
0.5000 mg | Freq: Once | INTRAMUSCULAR | Status: AC
  Administered 2024-04-27: 0.5 mg via INTRAVENOUS
  Filled 2024-04-27: qty 1

## 2024-04-27 MED ORDER — IOHEXOL 300 MG/ML  SOLN
100.0000 mL | Freq: Once | INTRAMUSCULAR | Status: AC | PRN
  Administered 2024-04-27: 100 mL via INTRAVENOUS

## 2024-04-27 MED ORDER — QUETIAPINE FUMARATE 25 MG PO TABS
50.0000 mg | ORAL_TABLET | Freq: Every day | ORAL | Status: DC
  Administered 2024-04-27: 50 mg via ORAL
  Filled 2024-04-27: qty 2

## 2024-04-27 NOTE — Progress Notes (Signed)
 117/84 93 91%RA 97.7 16, staff member heard a noise and came to the room. Patient on the mat beside the bed. Legs spread out, head resting against the bed somewhat in a sitting position. Received a new skin tear to the right elbow 2x1cm. Tegaderm placed after cleansing the area. MD and CN updated. Will call the POA.

## 2024-04-27 NOTE — ED Triage Notes (Signed)
 Pt arrived via [facility] from [facility] in [place] for evaluation of increased AMS. Staff at the facility report Pt has been hallucinating. Per EMS, Pt was recently Dx with a UTI. CBG 182.

## 2024-04-27 NOTE — H&P (Addendum)
 History and Physical    Virginia Young FMW:[id number] DOB: 1940-10-29 DOA: 04/27/2024  PCP: Pia Kerney SQUIBB, MD   Patient coming from: [facility] [place]  I have personally briefly reviewed patient's old medical records in [facility]  Chief Complaint: Altered mental status  HPI: Virginia Young is a 83 y.o. female with medical history significant for atrial fibrillation, congestive heart failure, dementia, hypertension, gout, diabetes mellitus. Patient was brought to the ED via EMS from nursing home with reports of altered mental status.  Staff at facility reported that patient had been hallucinating.  Patient was also diagnosed with a UTI recently and was started on a course of Keflex  and switched over to Macrobid , of which she has had about 2 doses.  She has also had multiple falls.  On my evaluation, patient is awake, agitated moving around the bed, but is answering most of my questions.  She tells me that she has had abdominal pain and low back pain for about a week.  She is unable to confirm if she has had urinary symptoms.  She also reports vomiting recently that has resolved.  No diarrhea.  No cough, no difficulty breathing, no chest pain.  Patient was in the ED 11/1 fall also diagnosed with UTI given a dose of ceftriaxone  and again 11/2 for another fall.  Urine cultures were not obtained.  ED Course:  Temp 97.3.  Heart rate 64-97.  Respirate rate 18-20.  Blood pressure systolic 112-160.  O2 sats > 95% on room air. WBC 8.9.  Ammonia 16.  Reported acid 11.  UA with rare bacteria and 11-20 WBCs. Chest x-ray negative for acute abnormality Head CT also without acute abnormality. IV Rocephin  given for possible UTI.  Urine cultures obtained.   Review of Systems: As per HPI all other systems reviewed and negative.  Past Medical History:  Diagnosis Date   Allergy     Atrial fibrillation (HCC)    coumadin    Cataract    Chest pain 03/2012   a. Lex MV 10/13:  EF 64%, dist  ant and apical defect sugg of soft tissue atten, no ischemia   Chronic systolic heart failure (HCC)    Depression    GERD (gastroesophageal reflux disease)    HLD (hyperlipidemia)    Mild mitral regurgitation    MS (multiple sclerosis)    NICM (nonischemic cardiomyopathy) (HCC)    a. neg CLite in 2003;  b. EF 40-45% in past;   c.  Echo 12/11: EF 35-40%, mild MR, mild LAE, mild RAE, small pericardial effusion    Osteoarthritis    Osteoporosis    Stasis ulcer (HCC)    Vaginal prolapse without uterine prolapse    Varicose veins     Past Surgical History:  Procedure Laterality Date   ANTERIOR AND POSTERIOR VAGINAL REPAIR W/ SACROSPINOUS LIGAMENT SUSPENSION  2016   [facility]   BLADDER SURGERY     CARPAL TUNNEL RELEASE Right 08/22/2017   Procedure: CARPAL TUNNEL RELEASE;  Surgeon: Kay Kemps, MD;  Location: [facility] OR;  Service: Orthopedics;  Laterality: Right;   cataract surgery Bilateral    EYE SURGERY     INGUINAL HERNIA REPAIR     right   LIPOMA EXCISION     h/o removed from upper back x 2   REVERSE SHOULDER ARTHROPLASTY Right 08/22/2017   Procedure: REVERSE SHOULDER ARTHROPLASTY;  Surgeon: Kay Kemps, MD;  Location: [facility] OR;  Service: Orthopedics;  Laterality: Right;     reports that  she has never smoked. She has never been exposed to tobacco smoke. She has never used smokeless tobacco. She reports that she does not drink alcohol and does not use drugs.  Allergies  Allergen Reactions   Bactrim  [Sulfamethoxazole -Trimethoprim ]     Mouth sores   Hydrocortisone Other (See Comments)    Unknown, no reaction listed on MAR   Lipitor [Atorvastatin  Calcium ]     myalgia   Lisinopril      cough    Family History  Problem Relation Age of Onset   Diabetes Father 21       diabetes and syncope   Heart attack Father    Heart attack Sister    Heart attack Brother        all 5 brothers had MIs   Stroke Brother    Cancer Sister        bone   Diabetes Sister    Cancer Sister    Diabetes  Sister    Heart attack Brother    Dementia Mother    Dementia Brother    CAD Other    Diabetes Daughter    Colon cancer Neg Hx    Neuropathy Neg Hx     Prior to Admission medications   Medication Sig Start Date End Date Taking? Authorizing Provider  acetaminophen  (TYLENOL ) 325 MG tablet Take 650 mg by mouth 3 (three) times daily. 12/11/23  Yes [provider]  Cholecalciferol  (VITAMIN D3) 1000 units CAPS Take 1 capsule by mouth daily.   Yes [provider]  ciprofloxacin  (CIPRO ) 500 MG tablet Take 500 mg by mouth 2 (two) times daily. 03/06/24  Yes [provider]  diclofenac  Sodium (VOLTAREN ) 1 % GEL Apply 2 g topically 2 (two) times daily. Apply generously to both knees for arthritis pain 12/14/23  Yes [provider]  divalproex  (DEPAKOTE  ER) 250 MG 24 hr tablet Take 1 tablet (250 mg total) by mouth daily. 09/23/20  Yes Samtani, Jai-Gurmukh, MD  docusate sodium  (COLACE) 100 MG capsule Take 100 mg by mouth daily.   Yes [provider]  DULoxetine  (CYMBALTA ) 60 MG capsule Take 60 mg by mouth daily. 12/21/23  Yes [provider]  ELIQUIS  5 MG TABS tablet TAKE 1 TABLET BY MOUTH EVERY 12 HOURS Patient taking differently: Take 5 mg by mouth every 12 (twelve) hours. 0800, 2000 11/16/20  Yes Hawks, Christy A, FNP  fenofibrate  (TRICOR ) 48 MG tablet Take 48 mg by mouth daily. 09/25/21  Yes [provider]  folic acid  (FOLVITE ) 1 MG tablet Take 1 mg by mouth daily.   Yes [provider]  furosemide  (LASIX ) 40 MG tablet Take 40 mg by mouth daily. 12/16/23  Yes [provider]  guaifenesin  (ROBITUSSIN) 100 MG/5ML syrup Take 10 mLs by mouth every 6 (six) hours as needed for cough. Do not exceed 4 doses in 24 hours.   Yes [provider]  ipratropium-albuterol  (DUONEB) 0.5-2.5 (3) MG/3ML SOLN Take 3 mLs by nebulization 3 (three) times daily. Patient taking differently: Take 3 mLs by nebulization 3 (three) times daily as  needed (dyspnea). 04/04/23  Yes Johnson, Clanford L, MD  magnesium  hydroxide (MILK OF MAGNESIA) 400 MG/5ML suspension Take 30 mLs by mouth 2 (two) times daily as needed for mild constipation. Notify physician if no relief in 24 hours   Yes [provider]  memantine  (NAMENDA ) 5 MG tablet Take 5 mg by mouth 2 (two) times daily. 12/16/23  Yes [provider]  metoprolol  tartrate (LOPRESSOR ) 25 MG  tablet Take 12.5 mg by mouth 3 (three) times daily. 8am,2pm,and 8pm 12/16/23  Yes [provider]  Multiple Vitamin (MULTIVITAMIN) tablet Take 1 tablet by mouth daily.   Yes [provider]  nitrofurantoin , macrocrystal-monohydrate, (MACROBID ) 100 MG capsule Take 100 mg by mouth 2 (two) times daily. 04/27/24 05/02/24 Yes [provider]  NYAMYC  powder Apply 1 Application topically 2 (two) times daily as needed (yeast). 12/09/23  Yes [provider]  ondansetron  (ZOFRAN ) 4 MG tablet Take 4 mg by mouth every 8 (eight) hours as needed for nausea or vomiting. 04/26/24  Yes [provider]  polyethylene glycol (MIRALAX  / GLYCOLAX ) 17 g packet Take 17 g by mouth daily as needed for mild constipation. Patient taking differently: Take 17 g by mouth daily. 04/04/23  Yes Johnson, Clanford L, MD  QUEtiapine  (SEROQUEL ) 50 MG tablet Take 25-50 mg by mouth 2 (two) times daily. 25 mg am, 50 mg pm 12/16/23  Yes [provider]  cephALEXin  (KEFLEX ) 500 MG capsule Take 1 capsule (500 mg total) by mouth 3 (three) times daily. Patient not taking: Reported on 04/27/2024 04/24/24   Geroldine Berg, MD  metolazone (ZAROXOLYN) 2.5 MG tablet Take 2.5 mg by mouth daily. 01/01/24   [provider]  midodrine  (PROAMATINE ) 2.5 MG tablet Take 2.5 mg by mouth 3 (three) times daily. 730am,1130am,430pm 12/16/23   [provider]  OXYGEN  Inhale into the lungs. 2 liters PRN - anytime when needed ( cardio rx'd)    [provider]  pantoprazole  (PROTONIX ) 40 MG  tablet Take 40 mg by mouth 2 (two) times daily. 02/10/24   [provider]  sodium chloride  1 g tablet Take 1 g by mouth daily.    [provider]    Physical Exam: Vitals:   04/27/24 1418 04/27/24 1434 04/27/24 1435 04/27/24 1436  BP: (!) 145/110     Pulse: 68 64 66 68  Resp: 20     Temp:      TempSrc:      SpO2: 98% 99% 98% 98%  Weight:      Height:        Constitutional: NAD, calm, comfortable Vitals:   04/27/24 1418 04/27/24 1434 04/27/24 1435 04/27/24 1436  BP: (!) 145/110     Pulse: 68 64 66 68  Resp: 20     Temp:      TempSrc:      SpO2: 98% 99% 98% 98%  Weight:      Height:       Eyes: PERRL, lids and conjunctivae normal ENMT: Mucous membranes are dry Neck: normal, supple, no masses, no thyromegaly Respiratory: clear to auscultation bilaterally, no wheezing, no crackles. Normal respiratory effort. No accessory muscle use.  Cardiovascular: Regular rate and rhythm, no murmurs / rubs / gallops. No extremity edema. Abdomen: Diffuse abdominal tenderness, no masses palpated. No hepatosplenomegaly.  Musculoskeletal: no clubbing / cyanosis. No joint deformity upper and lower extremities.  Skin: no rashes, lesions, ulcers. No induration Neurologic: Exam limited by patient's mental status - no facial asymmetry, speech fluent, moving all extremities but unable to determine degree of strength. Psychiatric: Awake and alert, agitated, oriented to person and to some extent situation but not place time  Labs on Admission: I have personally reviewed following labs and imaging studies  CBC: Recent Labs  Lab 04/24/24 0125 04/25/24 2246 04/27/24 1214  WBC 8.0 10.7* 8.9  NEUTROABS 4.2 6.2 5.1  HGB 14.8 14.2 14.2  HCT 46.0 45.2 43.6  MCV 88.0  89.2 86.9  PLT 222 234 229   Basic Metabolic Panel: Recent Labs  Lab 04/24/24 0125 04/25/24 2246 04/27/24 1214  NA 139 136 141  K 4.3 4.0 3.6  CL 99 94* 98  CO2 31 31 33*  GLUCOSE 127* 124* 119*  BUN 14 22 13    CREATININE 0.92 1.34* 0.85  CALCIUM  9.3 9.2 9.4   GFR: Estimated Creatinine Clearance: 65.4 mL/min (by C-G formula based on SCr of 0.85 mg/dL). Liver Function Tests: Recent Labs  Lab 04/25/24 2246 04/27/24 1214  AST 33 83*  ALT 17 33  ALKPHOS 95 95  BILITOT 0.6 0.9  PROT 6.9 6.9  ALBUMIN  4.1 3.9   No results for input(s): LIPASE, AMYLASE in the last 168 hours. Recent Labs  Lab 04/27/24 1214  AMMONIA 16   Urine analysis:    Component Value Date/Time   COLORURINE YELLOW 04/27/2024 1220   APPEARANCEUR HAZY (A) 04/27/2024 1220   APPEARANCEUR Cloudy (A) 09/12/2020 1321   LABSPEC 1.008 04/27/2024 1220   PHURINE 7.0 04/27/2024 1220   GLUCOSEU NEGATIVE 04/27/2024 1220   GLUCOSEU NEGATIVE 07/23/2013 1401   HGBUR LARGE (A) 04/27/2024 1220   BILIRUBINUR NEGATIVE 04/27/2024 1220   BILIRUBINUR Negative 09/12/2020 1321   KETONESUR NEGATIVE 04/27/2024 1220   PROTEINUR NEGATIVE 04/27/2024 1220   UROBILINOGEN 0.2 07/23/2013 1401   NITRITE NEGATIVE 04/27/2024 1220   LEUKOCYTESUR NEGATIVE 04/27/2024 1220    Radiological Exams on Admission: CT Head Wo Contrast Result Date: 04/27/2024 EXAM: CT HEAD WITHOUT CONTRAST 04/27/2024 02:07:26 PM TECHNIQUE: CT of the head was performed without the administration of intravenous contrast. Automated exposure control, iterative reconstruction, and/or weight based adjustment of the mA/kV was utilized to reduce the radiation dose to as low as reasonably achievable. COMPARISON: 04/25/2024 CLINICAL HISTORY: Delirium FINDINGS: BRAIN AND VENTRICLES: No acute hemorrhage. No evidence of acute infarct. Age-related atrophy and moderate chronic microvascular ischemic changes. Old lacunar infarct in left thalamus and basal ganglia. No hydrocephalus. No extra-axial collection. No mass effect or midline shift. Moderate vascular calcifications. ORBITS: No acute abnormality. Status post cataract surgery. SINUSES: No acute abnormality. SOFT TISSUES AND SKULL: No  acute soft tissue abnormality. No skull fracture. IMPRESSION: 1. No acute intracranial abnormality. Electronically signed by: Norman Gatlin MD 04/27/2024 02:44 PM EST RP Workstation: [id number]   DG Chest Portable 1 View Result Date: 04/27/2024 EXAM: 1 VIEW(S) XRAY OF THE CHEST 04/27/2024 11:07:31 AM COMPARISON: 04/25/2024 CLINICAL HISTORY: AMS FINDINGS: LUNGS AND PLEURA: No focal pulmonary opacity. No pulmonary edema. No pleural effusion. No pneumothorax. HEART AND MEDIASTINUM: Cardiomegaly, stable. Aortic atherosclerosis. BONES AND SOFT TISSUES: Partially visualized right shoulder prosthesis. Thoracic dextrocurvature. IMPRESSION: 1. No acute cardiopulmonary process identified. Electronically signed by: Helayne Hurst MD 04/27/2024 12:15 PM EST RP Workstation: [id number]   CT Cervical Spine Wo Contrast Result Date: 04/25/2024 CLINICAL DATA:  Unwitnessed fall. EXAM: CT CERVICAL SPINE WITHOUT CONTRAST TECHNIQUE: Multidetector CT imaging of the cervical spine was performed without intravenous contrast. Multiplanar CT image reconstructions were also generated. RADIATION DOSE REDUCTION: This exam was performed according to the departmental dose-optimization program which includes automated exposure control, adjustment of the mA and/or kV according to patient size and/or use of iterative reconstruction technique. COMPARISON:  January 13, 2024 FINDINGS: Alignment: There is stable, approximately 1 mm to 2 mm stepwise anterolisthesis of the C3, C4 and C5 vertebral bodies. Skull base and vertebrae: No acute fracture. Chronic and degenerative changes are seen involving the body and tip of the dens, as well as the adjacent portion of  the anterior arch of C1. Soft tissues and spinal canal: No prevertebral fluid or swelling. No visible canal hematoma. Disc levels: Marked severity endplate sclerosis, anterior osteophyte formation and posterior bony spurring are seen at the levels of C4-C5, C5-C6 and C6-C7. There is marked  severity narrowing of the anterior atlantoaxial articulation with marked severity intervertebral disc space narrowing at C5-C6 and C6-C7. Marked severity bilateral multilevel facet joint hypertrophy is noted. Upper chest: There is mild to moderate severity biapical scarring and/or atelectasis. Other: None. IMPRESSION: 1. No acute fracture or traumatic subluxation. 2. Marked severity multilevel degenerative changes, as described above. Electronically Signed   By: Suzen Dials M.D.   On: 04/25/2024 23:55   CT Head Wo Contrast Result Date: 04/25/2024 CLINICAL DATA:  Unwitnessed fall. EXAM: CT HEAD WITHOUT CONTRAST TECHNIQUE: Contiguous axial images were obtained from the base of the skull through the vertex without intravenous contrast. RADIATION DOSE REDUCTION: This exam was performed according to the departmental dose-optimization program which includes automated exposure control, adjustment of the mA and/or kV according to patient size and/or use of iterative reconstruction technique. COMPARISON:  January 13, 2024 FINDINGS: Brain: There is generalized cerebral atrophy with widening of the extra-axial spaces and ventricular dilatation. There are areas of decreased attenuation within the white matter tracts of the supratentorial brain, consistent with microvascular disease changes. Chronic bilateral basal ganglia and left thalamic lacunar infarcts are seen. Vascular: Marked severity bilateral cavernous carotid artery calcification is noted. Skull: Normal. Negative for fracture or focal lesion. Sinuses/Orbits: No acute finding. Other: Very mild right frontal scalp soft tissue swelling is noted. IMPRESSION: 1. Very mild right frontal scalp soft tissue swelling without evidence for an acute fracture or acute intracranial abnormality. 2. Generalized cerebral atrophy and microvascular disease changes of the supratentorial brain. 3. Chronic bilateral basal ganglia and left thalamic lacunar infarcts. Electronically  Signed   By: Suzen Dials M.D.   On: 04/25/2024 23:49   DG Pelvis Portable Result Date: 04/25/2024 CLINICAL DATA:  Unwitnessed fall. EXAM: PORTABLE PELVIS 1-2 VIEWS COMPARISON:  None Available. FINDINGS: There is no evidence of an acute pelvic fracture or diastasis. No pelvic bone lesions are seen. Moderate severity degenerative changes are noted involving both hips in the form of joint space narrowing and acetabular sclerosis. Additional marked severity degenerative changes are present within the lower lumbar spine. IMPRESSION: 1. No acute fracture or diastasis. 2. Moderate severity degenerative changes involving both hips. Electronically Signed   By: Suzen Dials M.D.   On: 04/25/2024 23:42   DG Chest Port 1 View Result Date: 04/25/2024 CLINICAL DATA:  Unwitnessed fall. EXAM: PORTABLE CHEST 1 VIEW COMPARISON:  January 13, 2024 FINDINGS: The cardiac silhouette is mildly enlarged and unchanged in size. Mild, stable diffuse chronic appearing increased interstitial lung markings are noted. Mild atelectatic changes are seen within the bilateral lung bases. No pleural effusion or pneumothorax is identified. There is stable dextroscoliosis of the thoracic spine with multilevel degenerative changes noted. IMPRESSION: Stable chronic changes with mild bibasilar atelectasis. Electronically Signed   By: Suzen Dials M.D.   On: 04/25/2024 23:40   EKG: Independently reviewed.  Atrial fibrillation, rate 93, QTc 489.  No significant change from prior.  Assessment/Plan Principal Problem:   Acute encephalopathy Active Problems:   Depression, major, single episode, moderate (HCC)   Atrial fibrillation (HCC)   Long term current use of anticoagulant   Essential hypertension   Dementia (HCC)   Chronic systolic HF (heart failure) (HCC)   Type 2 diabetes  mellitus without complications (HCC)  Assessment and Plan:  Acute encephalopathy- type/etiology as yet unspecified.  History of dementia.  Recent UTI  diagnosis 11/1, was started on a course of Keflex  and 11/3 was switched over to Macrobid .  Urine cultures not obtained.  No prior urine cultures on file.  UA today with 11-20 WBCs and rare bacteria.  CT negative for acute abnormality. Chest x-ray unremarkable.  Afebrile, WBC 8.9.  Dry mucous membranes, she reports abdominal and low back pain, and vomiting that has resolved.  Depakote  level 11. - CT abdominal pelvis with contrast - 500 mL bolus given, continue LR 75 cc/h X 20 hours - Resume Seroquel  as patient is agitated, will continue Depakote  with history of seizures - Hold Cybalta - Continue ceftriaxone  2 g daily Addendum-unwitnessed fall here in the hospital, will repeat head CT, patient is on anticoagulation- will discontinue for now, need to consider risk versus benefits of ongoing anticoagulation in this patient with multiple falls and advanced age.  Atrial fibrillation on chronic anticoagulation-rate controlled - Resume Eliquis , metoprolol   Hx of Hypertension-  - On midodrine , will resume  Chronic systolic CHF-stable and compensated at this time.  Has dry mucous membranes.  Med list-Lasix  40 mg daily.  Last echo on file from 2022 EF 55 to 60%. - Hold Lasix , metolazone for now  Seizure history - Resume Depakote   DM-diet controlled-A1c 6.7-12/28/2023  DVT prophylaxis: SCDS Code Status: Full code Family Communication: None at bedside Disposition Plan: ~ 2 days Consults called: None  Admission status:  Obs Med surg    Author: Tully FORBES Carwin, MD 04/27/2024 6:49 PM  For on call review [email or web address].

## 2024-04-27 NOTE — ED Provider Notes (Signed)
 [place] EMERGENCY DEPARTMENT AT [facility] Provider Note   CSN: [id number] Arrival date & time: 04/27/24  1018     Patient presents with: Altered Mental Status   Virginia Young is a 83 y.o. female.   HPI 83 year old female presents from [facility] living facility with altered mental status.  History is from EMS and later the nurse that takes care of the patient.  Patient has a history of dementia but normally walks, talks, and has good interactions.  However over the past 4-5 days has been seeing things and reaching for things that are not there.  Has been altered it did not seem to sleep at all last night.  Was diagnosed with a UTI and was started on Keflex  but her doctor at the facility changed her to Macrobid  and she has now had 2 doses of that.  She has been falling whenever she tries to walk.  Has had multiple falls.  She was seen acting like she was sewing despite nothing being in her hands.   Prior to Admission medications   Medication Sig Start Date End Date Taking? Authorizing Provider  acetaminophen  (TYLENOL ) 325 MG tablet Take 650 mg by mouth 3 (three) times daily. 12/11/23  Yes [provider]  Cholecalciferol  (VITAMIN D3) 1000 units CAPS Take 1 capsule by mouth daily.   Yes [provider]  ciprofloxacin  (CIPRO ) 500 MG tablet Take 500 mg by mouth 2 (two) times daily. 03/06/24  Yes [provider]  diclofenac  Sodium (VOLTAREN ) 1 % GEL Apply 2 g topically 2 (two) times daily. Apply generously to both knees for arthritis pain 12/14/23  Yes [provider]  divalproex  (DEPAKOTE  ER) 250 MG 24 hr tablet Take 1 tablet (250 mg total) by mouth daily. 09/23/20  Yes Samtani, Jai-Gurmukh, MD  docusate sodium  (COLACE) 100 MG capsule Take 100 mg by mouth daily.   Yes [provider]  DULoxetine  (CYMBALTA ) 60 MG capsule Take 60 mg by mouth daily. 12/21/23  Yes [provider]  ELIQUIS  5 MG TABS tablet TAKE 1 TABLET BY MOUTH  EVERY 12 HOURS Patient taking differently: Take 5 mg by mouth every 12 (twelve) hours. 0800, 2000 11/16/20  Yes Hawks, Christy A, FNP  fenofibrate  (TRICOR ) 48 MG tablet Take 48 mg by mouth daily. 09/25/21  Yes [provider]  folic acid  (FOLVITE ) 1 MG tablet Take 1 mg by mouth daily.   Yes [provider]  furosemide  (LASIX ) 40 MG tablet Take 40 mg by mouth daily. 12/16/23  Yes [provider]  guaifenesin  (ROBITUSSIN) 100 MG/5ML syrup Take 10 mLs by mouth every 6 (six) hours as needed for cough. Do not exceed 4 doses in 24 hours.   Yes [provider]  ipratropium-albuterol  (DUONEB) 0.5-2.5 (3) MG/3ML SOLN Take 3 mLs by nebulization 3 (three) times daily. Patient taking differently: Take 3 mLs by nebulization 3 (three) times daily as needed (dyspnea). 04/04/23  Yes Johnson, Clanford L, MD  magnesium  hydroxide (MILK OF MAGNESIA) 400 MG/5ML suspension Take 30 mLs by mouth 2 (two) times daily as needed for mild constipation. Notify physician if no relief in 24 hours   Yes [provider]  memantine  (NAMENDA ) 5 MG tablet Take 5 mg by mouth 2 (two) times daily. 12/16/23  Yes [provider]  metoprolol  tartrate (LOPRESSOR ) 25 MG tablet Take 12.5 mg by mouth 3 (three) times daily. 8am,2pm,and 8pm 12/16/23  Yes [provider]  Multiple Vitamin (MULTIVITAMIN) tablet Take 1 tablet by  mouth daily.   Yes [provider]  nitrofurantoin , macrocrystal-monohydrate, (MACROBID ) 100 MG capsule Take 100 mg by mouth 2 (two) times daily. 04/27/24 05/02/24 Yes [provider]  NYAMYC  powder Apply 1 Application topically 2 (two) times daily as needed (yeast). 12/09/23  Yes [provider]  ondansetron  (ZOFRAN ) 4 MG tablet Take 4 mg by mouth every 8 (eight) hours as needed for nausea or vomiting. 04/26/24  Yes [provider]  polyethylene glycol (MIRALAX  / GLYCOLAX ) 17 g packet Take 17 g by mouth daily as needed for mild  constipation. Patient taking differently: Take 17 g by mouth daily. 04/04/23  Yes Johnson, Clanford L, MD  QUEtiapine  (SEROQUEL ) 50 MG tablet Take 25-50 mg by mouth 2 (two) times daily. 25 mg am, 50 mg pm 12/16/23  Yes [provider]  cephALEXin  (KEFLEX ) 500 MG capsule Take 1 capsule (500 mg total) by mouth 3 (three) times daily. Patient not taking: Reported on 04/27/2024 04/24/24   Geroldine Berg, MD  metolazone (ZAROXOLYN) 2.5 MG tablet Take 2.5 mg by mouth daily. 01/01/24   [provider]  midodrine  (PROAMATINE ) 2.5 MG tablet Take 2.5 mg by mouth 3 (three) times daily. 730am,1130am,430pm 12/16/23   [provider]  OXYGEN  Inhale into the lungs. 2 liters PRN - anytime when needed ( cardio rx'd)    [provider]  pantoprazole  (PROTONIX ) 40 MG tablet Take 40 mg by mouth 2 (two) times daily. 02/10/24   [provider]  sodium chloride  1 g tablet Take 1 g by mouth daily.    [provider]    Allergies: Bactrim  [sulfamethoxazole -trimethoprim ], Hydrocortisone, Lipitor [atorvastatin  calcium ], and Lisinopril     Review of Systems  Unable to perform ROS: Mental status change    Updated Vital Signs BP (!) 145/110   Pulse 68   Temp (!) 97.3 F (36.3 C) (Oral)   Resp 20   Ht 5' 11 (1.803 m)   Wt 104 kg   SpO2 98%   BMI 31.98 kg/m   Physical Exam Vitals and nursing note reviewed.  Constitutional:      Appearance: She is well-developed. She is not ill-appearing or diaphoretic.  HENT:     Head: Normocephalic and atraumatic.     Mouth/Throat:     Mouth: Mucous membranes are dry.  Cardiovascular:     Rate and Rhythm: Normal rate and regular rhythm.     Heart sounds: Normal heart sounds.  Pulmonary:     Effort: Pulmonary effort is normal.     Breath sounds: Normal breath sounds.  Abdominal:     General: There is no distension.     Palpations: Abdomen is soft.     Tenderness: There is no abdominal tenderness.  Skin:    General: Skin  is warm and dry.  Neurological:     Mental Status: She is alert. She is disoriented.     Comments: Patient is awake and alert but disoriented.  She does move all 4 extremities equally on command.     (all labs ordered are listed, but only abnormal results are displayed) Labs Reviewed  URINALYSIS, W/ REFLEX TO CULTURE (INFECTION SUSPECTED) - Abnormal; Notable for the following components:      Result Value   APPearance HAZY (*)    Hgb urine dipstick LARGE (*)    Bacteria, UA RARE (*)    All other components within normal limits  COMPREHENSIVE METABOLIC PANEL WITH GFR - Abnormal; Notable for the following components:   CO2 33 (*)  Glucose, Bld 119 (*)    AST 83 (*)    All other components within normal limits  VALPROIC ACID  LEVEL - Abnormal; Notable for the following components:   Valproic Acid  Lvl 11 (*)    All other components within normal limits  URINE CULTURE  CBC WITH DIFFERENTIAL/PLATELET  AMMONIA    EKG: EKG Interpretation Date/Time:  Tuesday April 27 2024 10:28:38 EST Ventricular Rate:  93 PR Interval:    QRS Duration:  88 QT Interval:  393 QTC Calculation: 489 R Axis:   -26  Text Interpretation: Atrial fibrillation Borderline left axis deviation Low voltage, precordial leads Consider anterior infarct Minimal ST depression Confirmed by Freddi Hamilton [phone]) on 04/27/2024 11:10:32 AM  Radiology: CT Head Wo Contrast Result Date: 04/27/2024 EXAM: CT HEAD WITHOUT CONTRAST 04/27/2024 02:07:26 PM TECHNIQUE: CT of the head was performed without the administration of intravenous contrast. Automated exposure control, iterative reconstruction, and/or weight based adjustment of the mA/kV was utilized to reduce the radiation dose to as low as reasonably achievable. COMPARISON: 04/25/2024 CLINICAL HISTORY: Delirium FINDINGS: BRAIN AND VENTRICLES: No acute hemorrhage. No evidence of acute infarct. Age-related atrophy and moderate chronic microvascular ischemic changes. Old  lacunar infarct in left thalamus and basal ganglia. No hydrocephalus. No extra-axial collection. No mass effect or midline shift. Moderate vascular calcifications. ORBITS: No acute abnormality. Status post cataract surgery. SINUSES: No acute abnormality. SOFT TISSUES AND SKULL: No acute soft tissue abnormality. No skull fracture. IMPRESSION: 1. No acute intracranial abnormality. Electronically signed by: Norman Gatlin MD 04/27/2024 02:44 PM EST RP Workstation: [id number]   DG Chest Portable 1 View Result Date: 04/27/2024 EXAM: 1 VIEW(S) XRAY OF THE CHEST 04/27/2024 11:07:31 AM COMPARISON: 04/25/2024 CLINICAL HISTORY: AMS FINDINGS: LUNGS AND PLEURA: No focal pulmonary opacity. No pulmonary edema. No pleural effusion. No pneumothorax. HEART AND MEDIASTINUM: Cardiomegaly, stable. Aortic atherosclerosis. BONES AND SOFT TISSUES: Partially visualized right shoulder prosthesis. Thoracic dextrocurvature. IMPRESSION: 1. No acute cardiopulmonary process identified. Electronically signed by: Helayne Hurst MD 04/27/2024 12:15 PM EST RP Workstation: [id number]   CT Cervical Spine Wo Contrast Result Date: 04/25/2024 CLINICAL DATA:  Unwitnessed fall. EXAM: CT CERVICAL SPINE WITHOUT CONTRAST TECHNIQUE: Multidetector CT imaging of the cervical spine was performed without intravenous contrast. Multiplanar CT image reconstructions were also generated. RADIATION DOSE REDUCTION: This exam was performed according to the departmental dose-optimization program which includes automated exposure control, adjustment of the mA and/or kV according to patient size and/or use of iterative reconstruction technique. COMPARISON:  January 13, 2024 FINDINGS: Alignment: There is stable, approximately 1 mm to 2 mm stepwise anterolisthesis of the C3, C4 and C5 vertebral bodies. Skull base and vertebrae: No acute fracture. Chronic and degenerative changes are seen involving the body and tip of the dens, as well as the adjacent portion of the anterior  arch of C1. Soft tissues and spinal canal: No prevertebral fluid or swelling. No visible canal hematoma. Disc levels: Marked severity endplate sclerosis, anterior osteophyte formation and posterior bony spurring are seen at the levels of C4-C5, C5-C6 and C6-C7. There is marked severity narrowing of the anterior atlantoaxial articulation with marked severity intervertebral disc space narrowing at C5-C6 and C6-C7. Marked severity bilateral multilevel facet joint hypertrophy is noted. Upper chest: There is mild to moderate severity biapical scarring and/or atelectasis. Other: None. IMPRESSION: 1. No acute fracture or traumatic subluxation. 2. Marked severity multilevel degenerative changes, as described above. Electronically Signed   By: Suzen Dials M.D.   On: 04/25/2024 23:55   CT Head Wo  Contrast Result Date: 04/25/2024 CLINICAL DATA:  Unwitnessed fall. EXAM: CT HEAD WITHOUT CONTRAST TECHNIQUE: Contiguous axial images were obtained from the base of the skull through the vertex without intravenous contrast. RADIATION DOSE REDUCTION: This exam was performed according to the departmental dose-optimization program which includes automated exposure control, adjustment of the mA and/or kV according to patient size and/or use of iterative reconstruction technique. COMPARISON:  January 13, 2024 FINDINGS: Brain: There is generalized cerebral atrophy with widening of the extra-axial spaces and ventricular dilatation. There are areas of decreased attenuation within the white matter tracts of the supratentorial brain, consistent with microvascular disease changes. Chronic bilateral basal ganglia and left thalamic lacunar infarcts are seen. Vascular: Marked severity bilateral cavernous carotid artery calcification is noted. Skull: Normal. Negative for fracture or focal lesion. Sinuses/Orbits: No acute finding. Other: Very mild right frontal scalp soft tissue swelling is noted. IMPRESSION: 1. Very mild right frontal scalp  soft tissue swelling without evidence for an acute fracture or acute intracranial abnormality. 2. Generalized cerebral atrophy and microvascular disease changes of the supratentorial brain. 3. Chronic bilateral basal ganglia and left thalamic lacunar infarcts. Electronically Signed   By: Suzen Dials M.D.   On: 04/25/2024 23:49   DG Pelvis Portable Result Date: 04/25/2024 CLINICAL DATA:  Unwitnessed fall. EXAM: PORTABLE PELVIS 1-2 VIEWS COMPARISON:  None Available. FINDINGS: There is no evidence of an acute pelvic fracture or diastasis. No pelvic bone lesions are seen. Moderate severity degenerative changes are noted involving both hips in the form of joint space narrowing and acetabular sclerosis. Additional marked severity degenerative changes are present within the lower lumbar spine. IMPRESSION: 1. No acute fracture or diastasis. 2. Moderate severity degenerative changes involving both hips. Electronically Signed   By: Suzen Dials M.D.   On: 04/25/2024 23:42   DG Chest Port 1 View Result Date: 04/25/2024 CLINICAL DATA:  Unwitnessed fall. EXAM: PORTABLE CHEST 1 VIEW COMPARISON:  January 13, 2024 FINDINGS: The cardiac silhouette is mildly enlarged and unchanged in size. Mild, stable diffuse chronic appearing increased interstitial lung markings are noted. Mild atelectatic changes are seen within the bilateral lung bases. No pleural effusion or pneumothorax is identified. There is stable dextroscoliosis of the thoracic spine with multilevel degenerative changes noted. IMPRESSION: Stable chronic changes with mild bibasilar atelectasis. Electronically Signed   By: Suzen Dials M.D.   On: 04/25/2024 23:40     .Critical Care  Performed by: Freddi Hamilton, MD Authorized by: Freddi Hamilton, MD   Critical care provider statement:    Critical care time (minutes):  30   Critical care time was exclusive of:  Separately billable procedures and treating other patients   Critical care was  necessary to treat or prevent imminent or life-threatening deterioration of the following conditions:  CNS failure or compromise   Critical care was time spent personally by me on the following activities:  Development of treatment plan with patient or surrogate, discussions with consultants, evaluation of patient's response to treatment, examination of patient, ordering and review of laboratory studies, ordering and review of radiographic studies, ordering and performing treatments and interventions, pulse oximetry, re-evaluation of patient's condition and review of old charts    Medications Ordered in the ED  lactated ringers  bolus 500 mL (500 mLs Intravenous Bolus 04/27/24 1052)  haloperidol lactate (HALDOL) injection 2 mg (2 mg Intravenous Given 04/27/24 1113)  cefTRIAXone  (ROCEPHIN ) 1 g in sodium chloride  0.9 % 100 mL IVPB (1 g Intravenous New Bag/Given 04/27/24 1307)  LORazepam (ATIVAN) injection  0.5 mg (0.5 mg Intravenous Given 04/27/24 1305)  haloperidol lactate (HALDOL) injection 2 mg (2 mg Intravenous Given 04/27/24 1343)                                    Medical Decision Making Amount and/or Complexity of Data Reviewed Labs: ordered.    Details: Nonspecific urine with WBCs but no nitrites. Radiology: ordered and independent interpretation performed.    Details: No head bleed ECG/medicine tests: ordered and independent interpretation performed.    Details: A-fib  Risk Prescription drug management. Decision regarding hospitalization.   Patient presents with altered mental status.  Had to be given Haldol as well as a dose of Ativan and then another dose of Haldol due to intermittent and recurrent agitation.  CT head is benign.  Urine questionable though has also been on some antibiotics.  Given a dose of IV Rocephin .  Vital signs are otherwise reassuring.  However given her progressive worsening mental status I think she will need admission and further workup.  While I do not think  this is likely to be meningitis, at this point no LP could be performed given her Eliquis  use. Will admit, consulted Dr. Pearlean for admission.     Final diagnoses:  Encephalopathy, unspecified type    ED Discharge Orders     None          Freddi Hamilton, MD 04/27/24 1540

## 2024-04-28 DIAGNOSIS — G9340 Encephalopathy, unspecified: Secondary | ICD-10-CM | POA: Diagnosis not present

## 2024-04-28 DIAGNOSIS — N390 Urinary tract infection, site not specified: Secondary | ICD-10-CM

## 2024-04-28 DIAGNOSIS — Z7901 Long term (current) use of anticoagulants: Secondary | ICD-10-CM | POA: Diagnosis not present

## 2024-04-28 DIAGNOSIS — E119 Type 2 diabetes mellitus without complications: Secondary | ICD-10-CM | POA: Diagnosis not present

## 2024-04-28 DIAGNOSIS — I4821 Permanent atrial fibrillation: Secondary | ICD-10-CM | POA: Diagnosis not present

## 2024-04-28 LAB — BASIC METABOLIC PANEL WITH GFR
Anion gap: 10 (ref 5–15)
BUN: 9 mg/dL (ref 8–23)
CO2: 30 mmol/L (ref 22–32)
Calcium: 8.9 mg/dL (ref 8.9–10.3)
Chloride: 102 mmol/L (ref 98–111)
Creatinine, Ser: 0.7 mg/dL (ref 0.44–1.00)
GFR, Estimated: >60 mL/min (ref >60)
Glucose, Bld: 104 mg/dL — ABNORMAL HIGH (ref 70–99)
Potassium: 3.7 mmol/L (ref 3.5–5.1)
Sodium: 142 mmol/L (ref 135–145)

## 2024-04-28 LAB — CBC
HCT: 42.2 % (ref 36.0–46.0)
Hemoglobin: 13.8 g/dL (ref 12.0–15.0)
MCH: 28.2 pg (ref 26.0–34.0)
MCHC: 32.7 g/dL (ref 30.0–36.0)
MCV: 86.3 fL (ref 80.0–100.0)
Platelets: 216 K/uL (ref 150–400)
RBC: 4.89 MIL/uL (ref 3.87–5.11)
RDW: 14.4 % (ref 11.5–15.5)
WBC: 7.9 K/uL (ref 4.0–10.5)
nRBC: 0 % (ref 0.0–0.2)

## 2024-04-28 LAB — URINE CULTURE: Culture: <10000 — AB

## 2024-04-28 MED ORDER — DULOXETINE HCL 60 MG PO CPEP
60.0000 mg | ORAL_CAPSULE | Freq: Every day | ORAL | Status: DC
  Administered 2024-04-28: 60 mg via ORAL
  Filled 2024-04-28: qty 1

## 2024-04-28 MED ORDER — IPRATROPIUM-ALBUTEROL 0.5-2.5 (3) MG/3ML IN SOLN: 3.0000 mL | Freq: Three times a day (TID) | RESPIRATORY_TRACT | Status: AC | PRN

## 2024-04-28 MED ORDER — FENOFIBRATE 54 MG PO TABS
54.0000 mg | ORAL_TABLET | Freq: Every day | ORAL | Status: DC
  Administered 2024-04-28: 54 mg via ORAL
  Filled 2024-04-28 (×2): qty 1

## 2024-04-28 MED ORDER — DICLOFENAC SODIUM 1 % EX GEL
2.0000 g | Freq: Two times a day (BID) | CUTANEOUS | Status: DC
  Administered 2024-04-28: 2 g via TOPICAL
  Filled 2024-04-28: qty 100

## 2024-04-28 MED ORDER — DOCUSATE SODIUM 100 MG PO CAPS
100.0000 mg | ORAL_CAPSULE | Freq: Every day | ORAL | Status: DC
  Administered 2024-04-28: 100 mg via ORAL
  Filled 2024-04-28: qty 1

## 2024-04-28 MED ORDER — POLYETHYLENE GLYCOL 3350 17 G PO PACK: 17.0000 g | PACK | Freq: Every day | ORAL | Status: AC

## 2024-04-28 MED ORDER — FOLIC ACID 1 MG PO TABS
1.0000 mg | ORAL_TABLET | Freq: Every day | ORAL | Status: DC
  Administered 2024-04-28: 1 mg via ORAL
  Filled 2024-04-28: qty 1

## 2024-04-28 NOTE — TOC Transition Note (Signed)
 Transition of Care [facility]) - Discharge Note   Patient Details  Name: Virginia Young MRN: [id number] Date of Birth: [birth year]  [facility] CM/SW Contact:  Lucie Lunger, LCSWA Phone Number: 04/28/2024, 12:58 PM  Clinical Narrative:    CSW notes pt arrived to hospital from [facility] ALF. CSW spoke to [employer] admin with ALF who states pt can return today once medically stable. CSW sent over D/C summary and Fl2 to facility for review. CSW confirmed with Nathanel that pt is good to return.   CSW updated by Amy with [facility] that pt is active with their Dementia GUIDE program and they are able to offer [facility] PT/RN services also. CSW then updated by Nathanel that she wanted to speak with pts daughter about if she prefers this as pt has had another agency in the past and the ALF may have other preferred facilities. CSW explained that this CSW will speak with pts daughter Holley to review.   CSW spoke to pts daughter Holley to provide update on plan for D/C back to ALF. CSW spoke with Pam about preference in [facility] agency, Pam states that she is fine working with [facility] since pt is part of the GUIDE program and [facility] is already visiting pt at the ALF. CSW attempted to reach [place] to provide update, no answer. CSW called for EMS transport. Med necessity form printed to floor for RN. TOC signing off.   Final next level of care: Assisted Living Barriers to Discharge: Barriers Resolved   Patient Goals and CMS Choice Patient states their goals for this hospitalization and ongoing recovery are:: return to ALF CMS Medicare.gov Compare Post Acute Care list provided to:: Patient Represenative (must comment) Choice offered to / list presented to : Patient, Adult Children      Discharge Placement                Patient to be transferred to facility by: EMS Name of family member notified: Daughter Pam Patient and family notified of of transfer: 04/28/24  Discharge Plan and  Services Additional resources added to the After Visit Summary for                            [facility] Arranged: RN, PT [facility] Agency: Other - See comment Vannie) Date HH Agency Contacted: 04/28/24   Representative spoke with at [facility] Agency: Amy  Social Drivers of Health (SDOH) Interventions SDOH Screenings   Food Insecurity: Patient Unable To Answer (04/27/2024)  Housing: Unknown (04/27/2024)  Transportation Needs: Patient Unable To Answer (04/27/2024)  Utilities: Patient Unable To Answer (04/27/2024)  Alcohol Screen: Low Risk  (03/13/2022)  Depression (PHQ2-9): Low Risk  (03/13/2022)  Financial Resource Strain: Low Risk  (03/13/2022)  Physical Activity: Inactive (03/13/2022)  Social Connections: Patient Unable To Answer (04/27/2024)  Stress: No Stress Concern Present (03/13/2022)  Tobacco Use: Low Risk  (04/27/2024)     Readmission Risk Interventions    01/14/2024   10:55 AM 12/28/2023    7:37 PM  Readmission Risk Prevention Plan  Post Dischage Appt  Complete  Medication Screening  Complete  Transportation Screening Complete Complete  PCP or Specialist Appt within 3-5 Days Not Complete   HRI or Home Care Consult Complete   Social Work Consult for Recovery Care Planning/Counseling Complete   Palliative Care Screening Not Applicable   Medication Review Oceanographer) Complete

## 2024-04-28 NOTE — Discharge Summary (Signed)
 Physician Discharge Summary  Virginia Young FMW:[id number] DOB: 04-22-1941 DOA: 04/27/2024  PCP: Pia Kerney SQUIBB, MD  Admit date: 04/27/2024 Discharge date: 04/28/2024  Admitted From: ALF Disposition:  ALF  Recommendations for Outpatient Follow-up:  Follow up with PCP in 1 weeks Please discuss risks/benefits of resuming apixaban  given fall risk FALL PRECAUTIONS RECOMMENDED  Please obtain BMP/CBC in 1-2 weeks Please follow up on the following pending results:  final culture data   Discharge Condition: STABLE   CODE STATUS: FULL DIET:  resume previous diet    Brief Hospitalization Summary: Please see all hospital notes, images, labs for full details of the hospitalization. Admission provider HPI:   83 y.o. female with medical history significant for atrial fibrillation, congestive heart failure, dementia, hypertension, gout, diabetes mellitus. Patient was brought to the ED via EMS from nursing home with reports of altered mental status.  Staff at facility reported that patient had been hallucinating.  Patient was also diagnosed with a UTI recently and was started on a course of Keflex  and switched over to Macrobid , of which she has had about 2 doses.  She has also had multiple falls.  On my evaluation, patient is awake, agitated moving around the bed, but is answering most of my questions.  She tells me that she has had abdominal pain and low back pain for about a week.  She is unable to confirm if she has had urinary symptoms.  She also reports vomiting recently that has resolved.  No diarrhea.  No cough, no difficulty breathing, no chest pain.   Patient was in the ED 11/1 fall also diagnosed with UTI given a dose of ceftriaxone  and again 11/2 for another fall.  Urine cultures were not obtained.   ED Course:  Temp 97.3.  Heart rate 64-97.  Respirate rate 18-20.  Blood pressure systolic 112-160.  O2 sats > 95% on room air.  WBC 8.9.  Ammonia 16.  Reported acid 11.  UA with rare bacteria and  11-20 WBCs. Chest x-ray negative for acute abnormality.  Head CT also without acute abnormality.  IV Rocephin  given for possible UTI.  Urine cultures obtained.  Hospital Course  Pt was admitted for observation for mental status changes thought secondary to a partially treated UTI.  She had been started on oral antibiotics outpatient but when admitted she was placed on IV ceftriaxone  and received high doses prior to discharge and she has been treated for UTI.  She remains with dementia and disorientation but not outside of baseline.  She is at very high risk for falling and did have a fall in hospital and she has been taken off apixaban  at this time.  She is stable to return to ALF, we discussed with them if they felt she could return to them and she has been accepted back.  No further UTI treatment needed as she received IV treatment in hospital.  She is being discharged back to ALF in stable condition.  Discuss risks vs benefits of resuming apixaban  with primary care provider given her high fall risk and dementia.     Discharge Diagnoses:  Principal Problem:   Acute encephalopathy Active Problems:   Depression, major, single episode, moderate (HCC)   Atrial fibrillation (HCC)   Long term current use of anticoagulant   Essential hypertension   Dementia (HCC)   Chronic systolic HF (heart failure) (HCC)   Type 2 diabetes mellitus without complications [facility])   Discharge Instructions:  Allergies as of 04/28/2024  Reactions   Hydrocortisone Other (See Comments)   Unknown, no reaction listed on MAR   Lipitor [atorvastatin  Calcium ] Other (See Comments)   Myalgia    Bactrim  [sulfamethoxazole -trimethoprim ] Other (See Comments), Cough   Mouth sores   Lisinopril  Cough        Medication List     STOP taking these medications    Eliquis  5 MG Tabs tablet Generic drug: apixaban    nitrofurantoin  (macrocrystal-monohydrate) 100 MG capsule Commonly known as: MACROBID        TAKE  these medications    acetaminophen  325 MG tablet Commonly known as: TYLENOL  Take 650 mg by mouth 3 (three) times daily.   diclofenac  Sodium 1 % Gel Commonly known as: VOLTAREN  Apply 2 g topically 2 (two) times daily. Apply generously to both knees for arthritis pain   divalproex  250 MG 24 hr tablet Commonly known as: DEPAKOTE  ER Take 1 tablet (250 mg total) by mouth daily.   docusate sodium  100 MG capsule Commonly known as: COLACE Take 100 mg by mouth daily.   DULoxetine  60 MG capsule Commonly known as: CYMBALTA  Take 60 mg by mouth daily.   fenofibrate  48 MG tablet Commonly known as: TRICOR  Take 48 mg by mouth daily.   folic acid  1 MG tablet Commonly known as: FOLVITE  Take 1 mg by mouth daily.   furosemide  40 MG tablet Commonly known as: LASIX  Take 40 mg by mouth daily.   guaifenesin  100 MG/5ML syrup Commonly known as: ROBITUSSIN Take 10 mLs by mouth every 6 (six) hours as needed for cough. Do not exceed 4 doses in 24 hours.   ipratropium-albuterol  0.5-2.5 (3) MG/3ML Soln Commonly known as: DUONEB Take 3 mLs by nebulization 3 (three) times daily as needed (dyspnea).   magnesium  hydroxide 400 MG/5ML suspension Commonly known as: MILK OF MAGNESIA Take 30 mLs by mouth 2 (two) times daily as needed for mild constipation. Notify physician if no relief in 24 hours   memantine  5 MG tablet Commonly known as: NAMENDA  Take 5 mg by mouth 2 (two) times daily.   metoprolol  tartrate 25 MG tablet Commonly known as: LOPRESSOR  Take 12.5 mg by mouth 3 (three) times daily. 8am,2pm,and 8pm   multivitamin tablet Take 1 tablet by mouth daily.   Nyamyc  powder Generic drug: nystatin  Apply 1 Application topically 2 (two) times daily as needed (yeast).   ondansetron  4 MG tablet Commonly known as: ZOFRAN  Take 4 mg by mouth every 8 (eight) hours as needed for nausea or vomiting.   polyethylene glycol 17 g packet Commonly known as: MIRALAX  / GLYCOLAX  Take 17 g by mouth daily.    QUEtiapine  50 MG tablet Commonly known as: SEROQUEL  Take 25-50 mg by mouth 2 (two) times daily. 25 mg am, 50 mg pm   Vitamin D3 1000 units Caps Take 1 capsule by mouth daily.        Follow-up Information     Pia Kerney SQUIBB, MD. Schedule an appointment as soon as possible for a visit in 1 week(s).   Specialty: Internal Medicine Why: Hospital Follow Up Contact information: [address] [place] [address] [phone]                Allergies  Allergen Reactions   Hydrocortisone Other (See Comments)    Unknown, no reaction listed on MAR   Lipitor [Atorvastatin  Calcium ] Other (See Comments)    Myalgia    Bactrim  [Sulfamethoxazole -Trimethoprim ] Other (See Comments) and Cough    Mouth sores   Lisinopril  Cough  Allergies as of 04/28/2024       Reactions   Hydrocortisone Other (See Comments)   Unknown, no reaction listed on MAR   Lipitor [atorvastatin  Calcium ] Other (See Comments)   Myalgia    Bactrim  [sulfamethoxazole -trimethoprim ] Other (See Comments), Cough   Mouth sores   Lisinopril  Cough        Medication List     STOP taking these medications    Eliquis  5 MG Tabs tablet Generic drug: apixaban    nitrofurantoin  (macrocrystal-monohydrate) 100 MG capsule Commonly known as: MACROBID        TAKE these medications    acetaminophen  325 MG tablet Commonly known as: TYLENOL  Take 650 mg by mouth 3 (three) times daily.   diclofenac  Sodium 1 % Gel Commonly known as: VOLTAREN  Apply 2 g topically 2 (two) times daily. Apply generously to both knees for arthritis pain   divalproex  250 MG 24 hr tablet Commonly known as: DEPAKOTE  ER Take 1 tablet (250 mg total) by mouth daily.   docusate sodium  100 MG capsule Commonly known as: COLACE Take 100 mg by mouth daily.   DULoxetine  60 MG capsule Commonly known as: CYMBALTA  Take 60 mg by mouth daily.   fenofibrate  48 MG tablet Commonly known as: TRICOR  Take 48 mg by mouth daily.   folic acid   1 MG tablet Commonly known as: FOLVITE  Take 1 mg by mouth daily.   furosemide  40 MG tablet Commonly known as: LASIX  Take 40 mg by mouth daily.   guaifenesin  100 MG/5ML syrup Commonly known as: ROBITUSSIN Take 10 mLs by mouth every 6 (six) hours as needed for cough. Do not exceed 4 doses in 24 hours.   ipratropium-albuterol  0.5-2.5 (3) MG/3ML Soln Commonly known as: DUONEB Take 3 mLs by nebulization 3 (three) times daily as needed (dyspnea).   magnesium  hydroxide 400 MG/5ML suspension Commonly known as: MILK OF MAGNESIA Take 30 mLs by mouth 2 (two) times daily as needed for mild constipation. Notify physician if no relief in 24 hours   memantine  5 MG tablet Commonly known as: NAMENDA  Take 5 mg by mouth 2 (two) times daily.   metoprolol  tartrate 25 MG tablet Commonly known as: LOPRESSOR  Take 12.5 mg by mouth 3 (three) times daily. 8am,2pm,and 8pm   multivitamin tablet Take 1 tablet by mouth daily.   Nyamyc  powder Generic drug: nystatin  Apply 1 Application topically 2 (two) times daily as needed (yeast).   ondansetron  4 MG tablet Commonly known as: ZOFRAN  Take 4 mg by mouth every 8 (eight) hours as needed for nausea or vomiting.   polyethylene glycol 17 g packet Commonly known as: MIRALAX  / GLYCOLAX  Take 17 g by mouth daily.   QUEtiapine  50 MG tablet Commonly known as: SEROQUEL  Take 25-50 mg by mouth 2 (two) times daily. 25 mg am, 50 mg pm   Vitamin D3 1000 units Caps Take 1 capsule by mouth daily.        Procedures/Studies: CT HEAD WO CONTRAST ( ) Result Date: 04/27/2024 EXAM: CT HEAD WITHOUT CONTRAST 04/27/2024 07:50:02 PM TECHNIQUE: CT of the head was performed without the administration of intravenous contrast. Automated exposure control, iterative reconstruction, and/or weight based adjustment of the mA/kV was utilized to reduce the radiation dose to as low as reasonably achievable. COMPARISON: CT head 04/27/2024 CLINICAL HISTORY: Unwitnessed fall in the  hospital, on Eliquis . FINDINGS: Motion limited study.  Within this limitation: BRAIN AND VENTRICLES: No acute hemorrhage. No evidence of acute infarct. No hydrocephalus. No extra-axial collection. No mass effect or midline shift. Remote  lacunar infarcts in the left basal ganglia and thalamus. ORBITS: No acute abnormality. SINUSES: No acute abnormality. SOFT TISSUES AND SKULL: No acute soft tissue abnormality. No skull fracture. IMPRESSION: 1. No acute intracranial abnormality on motion limited assessment. Electronically signed by: Gilmore Molt MD 04/27/2024 10:40 PM EST RP Workstation: [id number]   CT ABDOMEN PELVIS W CONTRAST Result Date: 04/27/2024 EXAM: CT ABDOMEN AND PELVIS WITH CONTRAST 04/27/2024 07:50:02 PM TECHNIQUE: CT of the abdomen and pelvis was performed with the administration of 100 mL of iohexol  (OMNIPAQUE ) 300 MG/ML solution. Multiplanar reformatted images are provided for review. Automated exposure control, iterative reconstruction, and/or weight-based adjustment of the mA/kV was utilized to reduce the radiation dose to as low as reasonably achievable. COMPARISON: CT pelvis 04/24/2004, 12/27/2003, CT abdomen and pelvis 06/22/2021. CLINICAL HISTORY: Abdominal pain, altered mental status. FINDINGS: LOWER CHEST: Lung bases demonstrate mild scarring or atelectasis at the bases. There is motion degradation. Cardiomegaly. Mild coronary vascular calcification. Small pericardial effusion. LIVER: The liver is unremarkable. GALLBLADDER AND BILE DUCTS: Small gallstones. No biliary ductal dilatation. SPLEEN: No acute abnormality. PANCREAS: No acute abnormality. ADRENAL GLANDS: No acute abnormality. KIDNEYS, URETERS AND BLADDER: No stones in the kidneys or ureters. No hydronephrosis. No perinephric or periureteral stranding. Urinary bladder is unremarkable. GI AND BOWEL: Stomach demonstrates no acute abnormality. Diverticular disease of the left colon without acute inflammation. There is no bowel  obstruction. PERITONEUM AND RETROPERITONEUM: No ascites. No free air. VASCULATURE: Aorta is normal in caliber. Moderate aortic atherosclerosis. LYMPH NODES: No lymphadenopathy. REPRODUCTIVE ORGANS: No acute abnormality. BONES AND SOFT TISSUES: Scoliosis and multilevel degenerative changes of the spine. Linear lucency with mild sclerosis at the left acetabular roof, series 6 image 87, probably stable as compared with pelvic CT from 12/2023. No focal soft tissue abnormality. IMPRESSION: 1. No acute findings in the abdomen or pelvis allowing for motion degradation . 2. Small gallstones. 3. Diverticular disease of the left colon without acute inflammation. 4. Cardiomegaly with small pericardial effusion. 5. Linear lucency with mild sclerosis at the left acetabular roof suggestive of nondisplaced fracture but probably chronic to 12/2023 6. Moderate aortic atherosclerosis. Electronically signed by: Luke Bun MD 04/27/2024 08:09 PM EST RP Workstation: [id number]   CT Head Wo Contrast Result Date: 04/27/2024 EXAM: CT HEAD WITHOUT CONTRAST 04/27/2024 02:07:26 PM TECHNIQUE: CT of the head was performed without the administration of intravenous contrast. Automated exposure control, iterative reconstruction, and/or weight based adjustment of the mA/kV was utilized to reduce the radiation dose to as low as reasonably achievable. COMPARISON: 04/25/2024 CLINICAL HISTORY: Delirium FINDINGS: BRAIN AND VENTRICLES: No acute hemorrhage. No evidence of acute infarct. Age-related atrophy and moderate chronic microvascular ischemic changes. Old lacunar infarct in left thalamus and basal ganglia. No hydrocephalus. No extra-axial collection. No mass effect or midline shift. Moderate vascular calcifications. ORBITS: No acute abnormality. Status post cataract surgery. SINUSES: No acute abnormality. SOFT TISSUES AND SKULL: No acute soft tissue abnormality. No skull fracture. IMPRESSION: 1. No acute intracranial abnormality.  Electronically signed by: Norman Gatlin MD 04/27/2024 02:44 PM EST RP Workstation: [id number]   DG Chest Portable 1 View Result Date: 04/27/2024 EXAM: 1 VIEW(S) XRAY OF THE CHEST 04/27/2024 11:07:31 AM COMPARISON: 04/25/2024 CLINICAL HISTORY: AMS FINDINGS: LUNGS AND PLEURA: No focal pulmonary opacity. No pulmonary edema. No pleural effusion. No pneumothorax. HEART AND MEDIASTINUM: Cardiomegaly, stable. Aortic atherosclerosis. BONES AND SOFT TISSUES: Partially visualized right shoulder prosthesis. Thoracic dextrocurvature. IMPRESSION: 1. No acute cardiopulmonary process identified. Electronically signed by: Helayne Hurst MD 04/27/2024 12:15 PM EST RP  Workstation: [id number]   CT Cervical Spine Wo Contrast Result Date: 04/25/2024 CLINICAL DATA:  Unwitnessed fall. EXAM: CT CERVICAL SPINE WITHOUT CONTRAST TECHNIQUE: Multidetector CT imaging of the cervical spine was performed without intravenous contrast. Multiplanar CT image reconstructions were also generated. RADIATION DOSE REDUCTION: This exam was performed according to the departmental dose-optimization program which includes automated exposure control, adjustment of the mA and/or kV according to patient size and/or use of iterative reconstruction technique. COMPARISON:  January 13, 2024 FINDINGS: Alignment: There is stable, approximately 1 mm to 2 mm stepwise anterolisthesis of the C3, C4 and C5 vertebral bodies. Skull base and vertebrae: No acute fracture. Chronic and degenerative changes are seen involving the body and tip of the dens, as well as the adjacent portion of the anterior arch of C1. Soft tissues and spinal canal: No prevertebral fluid or swelling. No visible canal hematoma. Disc levels: Marked severity endplate sclerosis, anterior osteophyte formation and posterior bony spurring are seen at the levels of C4-C5, C5-C6 and C6-C7. There is marked severity narrowing of the anterior atlantoaxial articulation with marked severity intervertebral disc  space narrowing at C5-C6 and C6-C7. Marked severity bilateral multilevel facet joint hypertrophy is noted. Upper chest: There is mild to moderate severity biapical scarring and/or atelectasis. Other: None. IMPRESSION: 1. No acute fracture or traumatic subluxation. 2. Marked severity multilevel degenerative changes, as described above. Electronically Signed   By: Suzen Dials M.D.   On: 04/25/2024 23:55   CT Head Wo Contrast Result Date: 04/25/2024 CLINICAL DATA:  Unwitnessed fall. EXAM: CT HEAD WITHOUT CONTRAST TECHNIQUE: Contiguous axial images were obtained from the base of the skull through the vertex without intravenous contrast. RADIATION DOSE REDUCTION: This exam was performed according to the departmental dose-optimization program which includes automated exposure control, adjustment of the mA and/or kV according to patient size and/or use of iterative reconstruction technique. COMPARISON:  January 13, 2024 FINDINGS: Brain: There is generalized cerebral atrophy with widening of the extra-axial spaces and ventricular dilatation. There are areas of decreased attenuation within the white matter tracts of the supratentorial brain, consistent with microvascular disease changes. Chronic bilateral basal ganglia and left thalamic lacunar infarcts are seen. Vascular: Marked severity bilateral cavernous carotid artery calcification is noted. Skull: Normal. Negative for fracture or focal lesion. Sinuses/Orbits: No acute finding. Other: Very mild right frontal scalp soft tissue swelling is noted. IMPRESSION: 1. Very mild right frontal scalp soft tissue swelling without evidence for an acute fracture or acute intracranial abnormality. 2. Generalized cerebral atrophy and microvascular disease changes of the supratentorial brain. 3. Chronic bilateral basal ganglia and left thalamic lacunar infarcts. Electronically Signed   By: Suzen Dials M.D.   On: 04/25/2024 23:49   DG Pelvis Portable Result Date:  04/25/2024 CLINICAL DATA:  Unwitnessed fall. EXAM: PORTABLE PELVIS 1-2 VIEWS COMPARISON:  None Available. FINDINGS: There is no evidence of an acute pelvic fracture or diastasis. No pelvic bone lesions are seen. Moderate severity degenerative changes are noted involving both hips in the form of joint space narrowing and acetabular sclerosis. Additional marked severity degenerative changes are present within the lower lumbar spine. IMPRESSION: 1. No acute fracture or diastasis. 2. Moderate severity degenerative changes involving both hips. Electronically Signed   By: Suzen Dials M.D.   On: 04/25/2024 23:42   DG Chest Port 1 View Result Date: 04/25/2024 CLINICAL DATA:  Unwitnessed fall. EXAM: PORTABLE CHEST 1 VIEW COMPARISON:  January 13, 2024 FINDINGS: The cardiac silhouette is mildly enlarged and unchanged in size. Mild, stable  diffuse chronic appearing increased interstitial lung markings are noted. Mild atelectatic changes are seen within the bilateral lung bases. No pleural effusion or pneumothorax is identified. There is stable dextroscoliosis of the thoracic spine with multilevel degenerative changes noted. IMPRESSION: Stable chronic changes with mild bibasilar atelectasis. Electronically Signed   By: Suzen Dials M.D.   On: 04/25/2024 23:40   CT PELVIS WO CONTRAST Result Date: 04/24/2024 CLINICAL DATA:  Status post fall. EXAM: CT PELVIS WITHOUT CONTRAST TECHNIQUE: Multidetector CT imaging of the pelvis was performed following the standard protocol without intravenous contrast. RADIATION DOSE REDUCTION: This exam was performed according to the departmental dose-optimization program which includes automated exposure control, adjustment of the mA and/or kV according to patient size and/or use of iterative reconstruction technique. COMPARISON:  December 27, 2023 FINDINGS: Urinary Tract:  No abnormality visualized. Bowel: There is no evidence of bowel dilatation. Noninflamed diverticula are seen throughout  the sigmoid colon. Vascular/Lymphatic: No pathologically enlarged lymph nodes. There is atherosclerotic disease involving the bilateral common iliac arteries. Reproductive: Multiple small partially calcified heterogeneous uterine fibroids are noted. The bilateral adnexa are unremarkable. Other:  None. Musculoskeletal: A linear cortical deformity is seen extending along the dorsal aspect of the left acetabulum (axial CT images 68 through 76, CT series 3). This is present on the prior study and demonstrates very mild interval widening on the current exam. There is no evidence of dislocation. Degenerative changes are seen involving both hips in the form of joint space narrowing, acetabular sclerosis and lateral acetabular bony spurring. IMPRESSION: 1. Findings which may represent a subacute fracture of the dorsal aspect of the left acetabulum. Nonemergent MRI correlation is recommended. 2. Sigmoid diverticulosis. 3. Multiple small partially calcified uterine fibroids. 4. Aortic atherosclerosis. Electronically Signed   By: Suzen Dials M.D.   On: 04/24/2024 02:18   CT Lumbar Spine Wo Contrast Result Date: 04/24/2024 CLINICAL DATA:  Initial evaluation for acute trauma, fall. EXAM: CT LUMBAR SPINE WITHOUT CONTRAST TECHNIQUE: Multidetector CT imaging of the lumbar spine was performed without intravenous contrast administration. Multiplanar CT image reconstructions were also generated. RADIATION DOSE REDUCTION: This exam was performed according to the departmental dose-optimization program which includes automated exposure control, adjustment of the mA and/or kV according to patient size and/or use of iterative reconstruction technique. COMPARISON:  Prior CT from 12/27/2023. FINDINGS: Segmentation: Standard. Lowest well-formed disc space labeled the L5-S1 level. Alignment: Moderate to severe levoscoliosis, apex at L2. Alignment otherwise within normal limits with preservation of the normal lumbar lordosis.  Vertebrae: Vertebral body height maintained without acute or chronic fracture. Visualized sacrum and pelvis intact. No worrisome osseous lesions. Paraspinal and other soft tissues: Paraspinous soft tissues demonstrate no acute finding. Moderate aorto bi-iliac atherosclerotic disease. Disc levels: Moderate multilevel degenerative disc disease, grossly stable from prior. Lower lumbar facet hypertrophy. Resultant moderate to severe spinal stenosis at L3-4, similar to prior. IMPRESSION: 1. No acute osseous injury within the lumbar spine. 2. Moderate to severe levoscoliosis with multilevel degenerative disc disease and facet hypertrophy. Resultant moderate to severe spinal stenosis at L3-4, similar to prior. Aortic Atherosclerosis (ICD10-I70.0). Electronically Signed   By: Morene Hoard M.D.   On: 04/24/2024 02:03   DG Elbow Complete Right Result Date: 04/24/2024 EXAM: 3 VIEW(S) XRAY OF THE RIGHT ELBOW COMPARISON: None available. CLINICAL HISTORY: Fall. FINDINGS: BONES AND JOINTS: Mild arthritic changes in the right elbow. No fracture, subluxation, or dislocation. SOFT TISSUES: The soft tissues are unremarkable. IMPRESSION: 1. No acute abnormality. Electronically signed by: Franky Crease MD  04/24/2024 01:58 AM EDT RP Workstation: [id number]     Subjective: No specific complaints. Pt has dementia.    Discharge Exam: Vitals:   04/28/24 0032 04/28/24 0440  BP: (!) 145/61 (!) 144/75  Pulse: 87 92  Resp: 18 16  Temp: 98.6 F (37 C) 98.3 F (36.8 C)  SpO2: 95% 95%   Vitals:   04/27/24 1840 04/27/24 2031 04/28/24 0032 04/28/24 0440  BP: 117/84 136/85 (!) 145/61 (!) 144/75  Pulse: 93 89 87 92  Resp: 16 16 18 16   Temp: 97.7 F (36.5 C) 98.7 F (37.1 C) 98.6 F (37 C) 98.3 F (36.8 C)  TempSrc: Oral Axillary Axillary Axillary  SpO2: 91% 97% 95% 95%  Weight:      Height:       General: Pt is alert, awake, pleasantly confused, disoriented, not in acute distress Cardiovascular: normal S1/S2 +,  no rubs, no gallops Respiratory: CTA bilaterally, no wheezing, no rhonchi Abdominal: Soft, NT, ND, bowel sounds + Extremities: no edema, no cyanosis   The results of significant diagnostics from this hospitalization (including imaging, microbiology, ancillary and laboratory) are listed below for reference.     Microbiology: Recent Results (from the past 240 hours)  MRSA Next Gen by PCR, Nasal     Status: None   Collection Time: 04/27/24  5:40 PM   Specimen: Nasal Mucosa; Nasal Swab  Result Value Ref Range Status   MRSA by PCR Next Gen NOT DETECTED NOT DETECTED Final    Comment: (NOTE) The GeneXpert MRSA Assay (FDA approved for NASAL specimens only), is one component of a comprehensive MRSA colonization surveillance program. It is not intended to diagnose MRSA infection nor to guide or monitor treatment for MRSA infections. Test performance is not FDA approved in patients less than 48 years old. Performed at [facility], [address] [place], [place] [address]      Labs: BNP (last 3 results) Recent Labs    01/13/24 2158 01/15/24 0406 01/16/24 0407  BNP 108.0* 150.0* 179.0*   Basic Metabolic Panel: Recent Labs  Lab 04/24/24 0125 04/25/24 2246 04/27/24 1214 04/28/24 0427  NA 139 136 141 142  K 4.3 4.0 3.6 3.7  CL 99 94* 98 102  CO2 31 31 33* 30  GLUCOSE 127* 124* 119* 104*  BUN 14 22 13 9   CREATININE 0.92 1.34* 0.85 0.70  CALCIUM  9.3 9.2 9.4 8.9   Liver Function Tests: Recent Labs  Lab 04/25/24 2246 04/27/24 1214  AST 33 83*  ALT 17 33  ALKPHOS 95 95  BILITOT 0.6 0.9  PROT 6.9 6.9  ALBUMIN  4.1 3.9   No results for input(s): LIPASE, AMYLASE in the last 168 hours. Recent Labs  Lab 04/27/24 1214  AMMONIA 16   CBC: Recent Labs  Lab 04/24/24 0125 04/25/24 2246 04/27/24 1214 04/28/24 0427  WBC 8.0 10.7* 8.9 7.9  NEUTROABS 4.2 6.2 5.1  --   HGB 14.8 14.2 14.2 13.8  HCT 46.0 45.2 43.6 42.2  MCV 88.0 89.2 86.9 86.3  PLT 222 234 229 216    Cardiac Enzymes: No results for input(s): CKTOTAL, CKMB, CKMBINDEX, TROPONINI in the last 168 hours. BNP: Invalid input(s): POCBNP CBG: No results for input(s): GLUCAP in the last 168 hours. D-Dimer No results for input(s): DDIMER in the last 72 hours. Hgb A1c No results for input(s): HGBA1C in the last 72 hours. Lipid Profile No results for input(s): CHOL, HDL, LDLCALC, TRIG, CHOLHDL, LDLDIRECT in the last 72 hours. Thyroid  function studies No  results for input(s): TSH, T4TOTAL, T3FREE, THYROIDAB in the last 72 hours.  Invalid input(s): FREET3 Anemia work up No results for input(s): VITAMINB12, FOLATE, FERRITIN, TIBC, IRON, RETICCTPCT in the last 72 hours. Urinalysis    Component Value Date/Time   COLORURINE YELLOW 04/27/2024 1220   APPEARANCEUR HAZY (A) 04/27/2024 1220   APPEARANCEUR Cloudy (A) 09/12/2020 1321   LABSPEC 1.008 04/27/2024 1220   PHURINE 7.0 04/27/2024 1220   GLUCOSEU NEGATIVE 04/27/2024 1220   GLUCOSEU NEGATIVE 07/23/2013 1401   HGBUR LARGE (A) 04/27/2024 1220   BILIRUBINUR NEGATIVE 04/27/2024 1220   BILIRUBINUR Negative 09/12/2020 1321   KETONESUR NEGATIVE 04/27/2024 1220   PROTEINUR NEGATIVE 04/27/2024 1220   UROBILINOGEN 0.2 07/23/2013 1401   NITRITE NEGATIVE 04/27/2024 1220   LEUKOCYTESUR NEGATIVE 04/27/2024 1220   Sepsis Labs Recent Labs  Lab 04/24/24 0125 04/25/24 2246 04/27/24 1214 04/28/24 0427  WBC 8.0 10.7* 8.9 7.9   Microbiology Recent Results (from the past 240 hours)  MRSA Next Gen by PCR, Nasal     Status: None   Collection Time: 04/27/24  5:40 PM   Specimen: Nasal Mucosa; Nasal Swab  Result Value Ref Range Status   MRSA by PCR Next Gen NOT DETECTED NOT DETECTED Final    Comment: (NOTE) The GeneXpert MRSA Assay (FDA approved for NASAL specimens only), is one component of a comprehensive MRSA colonization surveillance program. It is not intended to diagnose MRSA infection nor to  guide or monitor treatment for MRSA infections. Test performance is not FDA approved in patients less than 59 years old. Performed at [facility], [address], [place], [place] [address]    Time coordinating discharge: 40 mins  SIGNED:  Afton Louder, MD  [facility] 04/28/2024, 10:03 AM How to contact the [facility] Attending or Consulting provider 7A - 7P or covering provider during after hours 7P -7A, for this patient?  Check the care team in [facility] and look for a) attending/consulting TRH provider listed and b) the TRH team listed Log into [email or web address] and use 's universal password to access. If you do not have the password, please contact the hospital operator. Locate the TRH provider you are looking for under [facility] and page to a number that you can be directly reached. If you still have difficulty reaching the provider, please page the [facility] (Director on Call) for the Hospitalists listed on amion for assistance.

## 2024-04-28 NOTE — Plan of Care (Signed)
  Problem: Clinical Measurements: Goal: Ability to maintain clinical measurements within normal limits will improve Outcome: Progressing Goal: Will remain free from infection Outcome: Progressing Goal: Diagnostic test results will improve Outcome: Progressing Goal: Respiratory complications will improve Outcome: Progressing Goal: Cardiovascular complication will be avoided Outcome: Progressing   Problem: Nutrition: Goal: Adequate nutrition will be maintained Outcome: Progressing   Problem: Elimination: Goal: Will not experience complications related to bowel motility Outcome: Progressing Goal: Will not experience complications related to urinary retention Outcome: Progressing   Problem: Pain Managment: Goal: General experience of comfort will improve and/or be controlled Outcome: Progressing

## 2024-04-28 NOTE — Discharge Instructions (Signed)
 IMPORTANT INFORMATION: PAY CLOSE ATTENTION   PHYSICIAN DISCHARGE INSTRUCTIONS  Follow with Primary care provider  Pia Kerney SQUIBB, MD  and other consultants as instructed by your Hospitalist Physician  SEEK MEDICAL CARE OR RETURN TO EMERGENCY ROOM IF SYMPTOMS COME BACK, WORSEN OR NEW PROBLEM DEVELOPS   Please note: You were cared for by a hospitalist during your hospital stay. Every effort will be made to forward records to your primary care provider.  You can request that your primary care provider send for your hospital records if they have not received them.  Once you are discharged, your primary care physician will handle any further medical issues. Please note that NO REFILLS for any discharge medications will be authorized once you are discharged, as it is imperative that you return to your primary care physician (or establish a relationship with a primary care physician if you do not have one) for your post hospital discharge needs so that they can reassess your need for medications and monitor your lab values.  Please get a complete blood count and chemistry panel checked by your Primary MD at your next visit, and again as instructed by your Primary MD.  Get Medicines reviewed and adjusted: Please take all your medications with you for your next visit with your Primary MD  Laboratory/radiological data: Please request your Primary MD to go over all hospital tests and procedure/radiological results at the follow up, please ask your primary care provider to get all Hospital records sent to his/her office.  In some cases, they will be blood work, cultures and biopsy results pending at the time of your discharge. Please request that your primary care provider follow up on these results.  If you are diabetic, please bring your blood sugar readings with you to your follow up appointment with primary care.    Please call and make your follow up appointments as soon as possible.    Also Note  the following: If you experience worsening of your admission symptoms, develop shortness of breath, life threatening emergency, suicidal or homicidal thoughts you must seek medical attention immediately by calling 911 or calling your MD immediately  if symptoms less severe.  You must read complete instructions/literature along with all the possible adverse reactions/side effects for all the Medicines you take and that have been prescribed to you. Take any new Medicines after you have completely understood and accpet all the possible adverse reactions/side effects.   Do not drive when taking Pain medications or sleeping medications (Benzodiazepines)  Do not take more than prescribed Pain, Sleep and Anxiety Medications. It is not advisable to combine anxiety,sleep and pain medications without talking with your primary care practitioner  Special Instructions: If you have smoked or chewed Tobacco  in the last 2 yrs please stop smoking, stop any regular Alcohol  and or any Recreational drug use.  Wear Seat belts while driving.  Do not drive if taking any narcotic, mind altering or controlled substances or recreational drugs or alcohol.

## 2024-04-28 NOTE — NC FL2 (Signed)
 [place]  MEDICAID FL2 LEVEL OF CARE FORM     IDENTIFICATION  Patient Name: Virginia Young Birthdate: 08-Dec-1940 Sex: female Admission Date (Current Location): 04/27/2024  [facility] and [place] Number:  [employer] and Address:  [facility],  [address], [place] [address]      Provider Number: [id number]  Attending Physician Name and Address:  Vicci Afton CROME, MD  Relative Name and Phone Number:       Current Level of Care: Hospital Recommended Level of Care: Assisted Living Facility Prior Approval Number:    Date Approved/Denied:   PASRR Number:    Discharge Plan: Other (Comment) (ALF)    Current Diagnoses: Patient Active Problem List   Diagnosis Date Noted   Acute encephalopathy 04/27/2024   Fall 01/14/2024   Acute respiratory failure with hypoxia (HCC) 12/28/2023   Dyslipidemia 12/28/2023   Paroxysmal atrial fibrillation (HCC) 12/28/2023   Type 2 diabetes mellitus without complications (HCC) 12/28/2023   Gout 12/28/2023   CAP (community acquired pneumonia) 12/27/2023   Pneumonia 04/01/2023   Hyponatremia 04/01/2023   Elevated serum creatinine 04/01/2023   Syncope 09/16/2020   Hypokalemia 09/16/2020   Hypoalbuminemia 09/16/2020   Abrasion of right arm 09/16/2020   Right sided weakness 09/16/2020   Accidental fall 09/16/2020   Syncope and collapse 09/16/2020   Acute CVA (cerebrovascular accident) (HCC) 09/15/2020   Pyometra due to chronic inflammatory disease of uterus 02/18/2020   Postmenopausal 08/11/2019   Vitamin D  deficiency 02/17/2019   Constipation 02/17/2019   Witnessed seizure-like activity (HCC) 02/03/2019   Chronic systolic HF (heart failure) (HCC) 10/08/2018   Educated about COVID-19 virus infection 10/08/2018   Dementia (HCC) 02/10/2018   S/P shoulder replacement, right 08/22/2017   Essential hypertension 02/19/2016   Voiding dysfunction 10/07/2014   SUI (stress urinary incontinence, female) 10/07/2014    Pathological fracture due to osteoporosis 04/06/2014   DOE (dyspnea on exertion) 07/23/2013   Helicobacter positive gastritis 06/21/2013   Orthostatic hypotension 06/10/2013   Recurrent UTI 05/06/2013   Neck mass 02/19/2013   Right groin mass 11/08/2011   Low back pain 11/08/2011   Lipoma of neck 06/21/2011   OVERACTIVE BLADDER 08/21/2010   Long term current use of anticoagulant 07/25/2010   Osteoarthritis 01/18/2010   KNEE PAIN 01/18/2010   FOOT PAIN 01/18/2010   Mixed hyperlipidemia 04/20/2009   GERD 04/17/2009   MITRAL REGURGITATION, MILD 08/30/2008   NICM (nonischemic cardiomyopathy) (HCC) 08/30/2008   Depression, major, single episode, moderate (HCC) 04/10/2007   DISEASE, MITRAL VALVE NEC/NOS 04/10/2007   Atrial fibrillation (HCC) 04/10/2007   STASIS ULCER 04/10/2007   VENOUS INSUFFICIENCY, LEGS 04/10/2007    Orientation RESPIRATION BLADDER Height & Weight     Self  Normal Incontinent Weight: 220 lb 14.4 oz (100.2 kg) Height:  5' 10 (177.8 cm)  BEHAVIORAL SYMPTOMS/MOOD NEUROLOGICAL BOWEL NUTRITION STATUS      Continent, Incontinent Diet (Regular)  AMBULATORY STATUS COMMUNICATION OF NEEDS Skin   Independent Verbally Normal                       Personal Care Assistance Level of Assistance  Bathing, Feeding, Dressing Bathing Assistance: Maximum assistance Feeding assistance: Independent Dressing Assistance: Maximum assistance     Functional Limitations Info  Sight, Hearing, Speech Sight Info: Impaired Hearing Info: Adequate Speech Info: Adequate    SPECIAL CARE FACTORS FREQUENCY  Contractures Contractures Info: Not present    Additional Factors Info  Code Status, Allergies Code Status Info: FULL Allergies Info: Hydrocortisone, Lipitor (Atorvastatin  Calcium ), Bactrim  (Sulfamethoxazole -trimethoprim ), Lisinopril            Current Medications (04/28/2024):  This is the current hospital active medication list Current  Facility-Administered Medications  Medication Dose Route Frequency Provider Last Rate Last Admin   acetaminophen  (TYLENOL ) tablet 650 mg  650 mg Oral Q6H PRN Emokpae, Ejiroghene E, MD       Or   acetaminophen  (TYLENOL ) suppository 650 mg  650 mg Rectal Q6H PRN Emokpae, Ejiroghene E, MD       cefTRIAXone  (ROCEPHIN ) 2 g in sodium chloride  0.9 % 100 mL IVPB  2 g Intravenous Q24H Emokpae, Ejiroghene E, MD 200 mL/hr at 04/28/24 0820 2 g at 04/28/24 0820   diclofenac  Sodium (VOLTAREN ) 1 % topical gel 2 g  2 g Topical BID Johnson, Clanford L, MD   2 g at 04/28/24 9041   divalproex  (DEPAKOTE  ER) 24 hr tablet 250 mg  250 mg Oral Daily Emokpae, Ejiroghene E, MD   250 mg at 04/28/24 0957   docusate sodium  (COLACE) capsule 100 mg  100 mg Oral Daily Johnson, Clanford L, MD   100 mg at 04/28/24 0957   DULoxetine  (CYMBALTA ) DR capsule 60 mg  60 mg Oral Daily Johnson, Clanford L, MD   60 mg at 04/28/24 0957   fenofibrate  tablet 54 mg  54 mg Oral Daily Johnson, Clanford L, MD   54 mg at 04/28/24 0957   folic acid  (FOLVITE ) tablet 1 mg  1 mg Oral Daily Johnson, Clanford L, MD   1 mg at 04/28/24 0957   ipratropium-albuterol  (DUONEB) 0.5-2.5 (3) MG/3ML nebulizer solution 3 mL  3 mL Nebulization TID PRN Emokpae, Ejiroghene E, MD       lactated ringers  infusion   Intravenous Continuous Emokpae, Ejiroghene E, MD 75 mL/hr at 04/27/24 1716 New Bag at 04/27/24 1716   metoprolol  tartrate (LOPRESSOR ) tablet 12.5 mg  12.5 mg Oral TID Emokpae, Ejiroghene E, MD   12.5 mg at 04/28/24 0821   polyethylene glycol (MIRALAX  / GLYCOLAX ) packet 17 g  17 g Oral Daily PRN Emokpae, Ejiroghene E, MD       promethazine (PHENERGAN) tablet 12.5 mg  12.5 mg Oral Q6H PRN Emokpae, Ejiroghene E, MD       QUEtiapine  (SEROQUEL ) tablet 25 mg  25 mg Oral Daily Emokpae, Ejiroghene E, MD   25 mg at 04/28/24 9178   QUEtiapine  (SEROQUEL ) tablet 50 mg  50 mg Oral QHS Emokpae, Ejiroghene E, MD   50 mg at 04/27/24 2202     Discharge  Medications: Allergies as of 04/28/2024       Reactions   Hydrocortisone Other (See Comments)   Unknown, no reaction listed on MAR   Lipitor [atorvastatin  Calcium ] Other (See Comments)   Myalgia    Bactrim  [sulfamethoxazole -trimethoprim ] Other (See Comments), Cough   Mouth sores   Lisinopril  Cough        Medication List     STOP taking these medications    Eliquis  5 MG Tabs tablet Generic drug: apixaban    nitrofurantoin  (macrocrystal-monohydrate) 100 MG capsule Commonly known as: MACROBID        TAKE these medications    acetaminophen  325 MG tablet Commonly known as: TYLENOL  Take 650 mg by mouth 3 (three) times daily.   diclofenac  Sodium 1 % Gel Commonly known as: VOLTAREN  Apply 2 g topically 2 (two) times daily. Apply  generously to both knees for arthritis pain   divalproex  250 MG 24 hr tablet Commonly known as: DEPAKOTE  ER Take 1 tablet (250 mg total) by mouth daily.   docusate sodium  100 MG capsule Commonly known as: COLACE Take 100 mg by mouth daily.   DULoxetine  60 MG capsule Commonly known as: CYMBALTA  Take 60 mg by mouth daily.   fenofibrate  48 MG tablet Commonly known as: TRICOR  Take 48 mg by mouth daily.   folic acid  1 MG tablet Commonly known as: FOLVITE  Take 1 mg by mouth daily.   furosemide  40 MG tablet Commonly known as: LASIX  Take 40 mg by mouth daily.   guaifenesin  100 MG/5ML syrup Commonly known as: ROBITUSSIN Take 10 mLs by mouth every 6 (six) hours as needed for cough. Do not exceed 4 doses in 24 hours.   ipratropium-albuterol  0.5-2.5 (3) MG/3ML Soln Commonly known as: DUONEB Take 3 mLs by nebulization 3 (three) times daily as needed (dyspnea).   magnesium  hydroxide 400 MG/5ML suspension Commonly known as: MILK OF MAGNESIA Take 30 mLs by mouth 2 (two) times daily as needed for mild constipation. Notify physician if no relief in 24 hours   memantine  5 MG tablet Commonly known as: NAMENDA  Take 5 mg by mouth 2 (two) times  daily.   metoprolol  tartrate 25 MG tablet Commonly known as: LOPRESSOR  Take 12.5 mg by mouth 3 (three) times daily. 8am,2pm,and 8pm   multivitamin tablet Take 1 tablet by mouth daily.   Nyamyc  powder Generic drug: nystatin  Apply 1 Application topically 2 (two) times daily as needed (yeast).   ondansetron  4 MG tablet Commonly known as: ZOFRAN  Take 4 mg by mouth every 8 (eight) hours as needed for nausea or vomiting.   polyethylene glycol 17 g packet Commonly known as: MIRALAX  / GLYCOLAX  Take 17 g by mouth daily.   QUEtiapine  50 MG tablet Commonly known as: SEROQUEL  Take 25-50 mg by mouth 2 (two) times daily. 25 mg am, 50 mg pm   Vitamin D3 1000 units Caps Take 1 capsule by mouth daily.          Relevant Imaging Results:  Relevant Lab Results:   Additional Information SSN: [id number] [place]

## 2024-06-10 ENCOUNTER — Encounter (HOSPITAL_COMMUNITY): Payer: Self-pay | Admitting: Emergency Medicine

## 2024-06-10 ENCOUNTER — Emergency Department (HOSPITAL_COMMUNITY)
Admission: EM | Admit: 2024-06-10 | Discharge: 2024-06-10 | Disposition: A | Discharge status: Skilled Nursing Facility | Source: Home / Self Care | Attending: Emergency Medicine | Admitting: Emergency Medicine

## 2024-06-10 ENCOUNTER — Other Ambulatory Visit: Payer: Self-pay

## 2024-06-10 ENCOUNTER — Emergency Department (HOSPITAL_COMMUNITY)

## 2024-06-10 DIAGNOSIS — I5020 Unspecified systolic (congestive) heart failure: Secondary | ICD-10-CM | POA: Insufficient documentation

## 2024-06-10 DIAGNOSIS — R0602 Shortness of breath: Secondary | ICD-10-CM | POA: Diagnosis present

## 2024-06-10 LAB — COMPREHENSIVE METABOLIC PANEL WITH GFR
ALT: 11 U/L (ref 0–44)
AST: 20 U/L (ref 15–41)
Albumin: 4 g/dL (ref 3.5–5.0)
Alkaline Phosphatase: 123 U/L (ref 38–126)
Anion gap: 4 — ABNORMAL LOW (ref 5–15)
BUN: 9 mg/dL (ref 8–23)
CO2: 37 mmol/L — ABNORMAL HIGH (ref 22–32)
Calcium: 9.2 mg/dL (ref 8.9–10.3)
Chloride: 98 mmol/L (ref 98–111)
Creatinine, Ser: 0.72 mg/dL (ref 0.44–1.00)
GFR, Estimated: >60 mL/min (ref >60)
Glucose, Bld: 115 mg/dL — ABNORMAL HIGH (ref 70–99)
Potassium: 4.2 mmol/L (ref 3.5–5.1)
Sodium: 138 mmol/L (ref 135–145)
Total Bilirubin: 0.6 mg/dL (ref 0.0–1.2)
Total Protein: 6.9 g/dL (ref 6.5–8.1)

## 2024-06-10 LAB — CBC WITH DIFFERENTIAL/PLATELET
Abs Immature Granulocytes: 0.03 K/uL (ref 0.00–0.07)
Basophils Absolute: 0.1 K/uL (ref 0.0–0.1)
Basophils Relative: 1 %
Eosinophils Absolute: 0.2 K/uL (ref 0.0–0.5)
Eosinophils Relative: 3 %
HCT: 46 % (ref 36.0–46.0)
Hemoglobin: 14.5 g/dL (ref 12.0–15.0)
Immature Granulocytes: 0 %
Lymphocytes Relative: 28 %
Lymphs Abs: 2.2 K/uL (ref 0.7–4.0)
MCH: 28.2 pg (ref 26.0–34.0)
MCHC: 31.5 g/dL (ref 30.0–36.0)
MCV: 89.5 fL (ref 80.0–100.0)
Monocytes Absolute: 0.8 K/uL (ref 0.1–1.0)
Monocytes Relative: 10 %
Neutro Abs: 4.6 K/uL (ref 1.7–7.7)
Neutrophils Relative %: 58 %
Platelets: 218 K/uL (ref 150–400)
RBC: 5.14 MIL/uL — ABNORMAL HIGH (ref 3.87–5.11)
RDW: 14.5 % (ref 11.5–15.5)
WBC: 7.8 K/uL (ref 4.0–10.5)
nRBC: 0 % (ref 0.0–0.2)

## 2024-06-10 LAB — TROPONIN T, HIGH SENSITIVITY: Troponin T High Sensitivity: <15 ng/L (ref 0–19)

## 2024-06-10 LAB — PRO BRAIN NATRIURETIC PEPTIDE: Pro Brain Natriuretic Peptide: 1253 pg/mL — ABNORMAL HIGH (ref <300.0)

## 2024-06-10 MED ORDER — FUROSEMIDE 10 MG/ML IJ SOLN
40.0000 mg | Freq: Once | INTRAMUSCULAR | Status: AC
  Administered 2024-06-10: 10:00:00 40 mg via INTRAVENOUS
  Filled 2024-06-10: qty 4

## 2024-06-10 NOTE — Discharge Instructions (Signed)
 Increase your Lasix  pills you are taking 80 mg a day for the next 2 days.  Follow-up with your doctors as needed

## 2024-06-10 NOTE — ED Notes (Signed)
 [facility] called to transport patient back to [facility]. Nurse aware

## 2024-06-10 NOTE — ED Notes (Signed)
 Pt had a whole bed change and cleaned up. Pt was wip[ed down, new brief placed and adjusted in bed. Pt is resting comfortably at this time with call light in reach.

## 2024-06-10 NOTE — ED Triage Notes (Signed)
 Pt bib [facility] from [place] for SOB, headache and hypertension that started yesterday. Pt wears 2L at baseline.

## 2024-06-10 NOTE — ED Notes (Signed)
 This RN made multiple attempts to contact [facility], but was unsuccessful. This RN was unable to leave HIPAA VM due to the mailbox being full at this time. Pt resting comfortably awaiting transport back to the facility.

## 2024-06-10 NOTE — ED Notes (Signed)
 Patient is being followed by Hospice. Facility did not call hospice before sending her. Said they could have probably treated  her at facility and family might not hve wanted her to come to ED.  Hospice # [phone]

## 2024-06-10 NOTE — ED Provider Notes (Signed)
 [place] EMERGENCY DEPARTMENT AT [facility] Provider Note   CSN: [id number] Arrival date & time: 06/10/24  9360     Patient presents with: Shortness of Breath   Virginia Young is a 83 y.o. female.  {Add pertinent medical, surgical, social history, OB history to YEP:67052} Patient complains of shortness of breath.  Patient has a history of congestive heart failure and is on 2 L.   Shortness of Breath      Prior to Admission medications  Medication Sig Start Date End Date Taking? Authorizing Provider  acetaminophen  (TYLENOL ) 325 MG tablet Take 650 mg by mouth 3 (three) times daily. 12/11/23   [provider]  Cholecalciferol  (VITAMIN D3) 1000 units CAPS Take 1 capsule by mouth daily.    [provider]  diclofenac  Sodium (VOLTAREN ) 1 % GEL Apply 2 g topically 2 (two) times daily. Apply generously to both knees for arthritis pain 12/14/23   [provider]  divalproex  (DEPAKOTE  ER) 250 MG 24 hr tablet Take 1 tablet (250 mg total) by mouth daily. 09/23/20   Samtani, Jai-Gurmukh, MD  docusate sodium  (COLACE) 100 MG capsule Take 100 mg by mouth daily.    [provider]  DULoxetine  (CYMBALTA ) 60 MG capsule Take 60 mg by mouth daily. 12/21/23   [provider]  fenofibrate  (TRICOR ) 48 MG tablet Take 48 mg by mouth daily. 09/25/21   [provider]  folic acid  (FOLVITE ) 1 MG tablet Take 1 mg by mouth daily.    [provider]  furosemide  (LASIX ) 40 MG tablet Take 40 mg by mouth daily. 12/16/23   [provider]  guaifenesin  (ROBITUSSIN) 100 MG/5ML syrup Take 10 mLs by mouth every 6 (six) hours as needed for cough. Do not exceed 4 doses in 24 hours.    [provider]  ipratropium-albuterol  (DUONEB) 0.5-2.5 (3) MG/3ML SOLN Take 3 mLs by nebulization 3 (three) times daily as needed (dyspnea). 04/28/24   Johnson, Clanford L, MD  magnesium  hydroxide (MILK OF MAGNESIA) 400 MG/5ML suspension Take 30 mLs by  mouth 2 (two) times daily as needed for mild constipation. Notify physician if no relief in 24 hours    [provider]  memantine  (NAMENDA ) 5 MG tablet Take 5 mg by mouth 2 (two) times daily. 12/16/23   [provider]  metoprolol  tartrate (LOPRESSOR ) 25 MG tablet Take 12.5 mg by mouth 3 (three) times daily. 8am,2pm,and 8pm 12/16/23   [provider]  Multiple Vitamin (MULTIVITAMIN) tablet Take 1 tablet by mouth daily.    [provider]  NYAMYC  powder Apply 1 Application topically 2 (two) times daily as needed (yeast). 12/09/23   [provider]  ondansetron  (ZOFRAN ) 4 MG tablet Take 4 mg by mouth every 8 (eight) hours as needed for nausea or vomiting. 04/26/24   [provider]  polyethylene glycol (MIRALAX  / GLYCOLAX ) 17 g packet Take 17 g by mouth daily. 04/28/24   Johnson, Clanford L, MD  QUEtiapine  (SEROQUEL ) 50 MG tablet Take 25-50 mg by mouth 2 (two) times daily. 25 mg am, 50 mg pm 12/16/23   [provider]    Allergies: Hydrocortisone, Lipitor [atorvastatin  calcium ], Bactrim  [sulfamethoxazole -trimethoprim ], and Lisinopril     Review of Systems  Respiratory:  Positive for shortness of breath.     Updated Vital Signs BP (!) 197/100   Pulse 81   Temp 98.8 F (37.1 C) (Axillary)   Resp 15   Ht 5' 10 (1.778 m)   Wt 100.2 kg  SpO2 96%   BMI 31.70 kg/m   Physical Exam  (all labs ordered are listed, but only abnormal results are displayed) Labs Reviewed  CBC WITH DIFFERENTIAL/PLATELET - Abnormal; Notable for the following components:      Result Value   RBC 5.14 (*)    All other components within normal limits  COMPREHENSIVE METABOLIC PANEL WITH GFR - Abnormal; Notable for the following components:   CO2 37 (*)    Glucose, Bld 115 (*)    Anion gap 4 (*)    All other components within normal limits  PRO BRAIN NATRIURETIC PEPTIDE - Abnormal; Notable for the following components:   Pro Brain Natriuretic Peptide  1,253.0 (*)    All other components within normal limits  TROPONIN T, HIGH SENSITIVITY    EKG: EKG Interpretation Date/Time:  Thursday June 10 2024 06:52:45 EST Ventricular Rate:  81 PR Interval:    QRS Duration:  111 QT Interval:  390 QTC Calculation: 456 R Axis:   -58  Text Interpretation: Atrial fibrillation Left anterior fascicular block Low voltage, precordial leads Consider anterior infarct Repol abnrm suggests ischemia, lateral leads Artifact in lead(s) I III aVR aVL aVF Confirmed by Suzette Pac [phone]) on 06/10/2024 9:20:57 AM  Radiology: [facility] Chest Port 1 View Result Date: 06/10/2024 EXAM: 1 VIEW(S) XRAY OF THE CHEST 06/10/2024 07:25:00 AM COMPARISON: 04/27/2024 CLINICAL HISTORY: sob FINDINGS: LUNGS AND PLEURA: Coarsened interstitial markings are seen bilaterally and appear unchanged from the previous exam. No focal pulmonary opacity. No pleural effusion. No pneumothorax. HEART AND MEDIASTINUM: Similar cardiomegaly. Aortic atherosclerotic calcifications. BONES AND SOFT TISSUES: Partially imaged right shoulder arthroplasty. Dextrocurvature of thoracic spine. IMPRESSION: 1. No acute findings. 2. Coarsened interstitial markings bilaterally, unchanged from the previous exam. Electronically signed by: Waddell Calk MD 06/10/2024 07:48 AM EST RP Workstation: [id number]    {Document cardiac monitor, telemetry assessment procedure when appropriate:32947} Procedures   Medications Ordered in the ED  furosemide  (LASIX ) injection 40 mg (has no administration in time range)     Patient with mild exacerbation of her congestive heart failure.  She was given 40 of Lasix  IV and had some diuresis. {Click here for ABCD2, HEART and other calculators REFRESH Note before signing:1}                              Medical Decision Making Amount and/or Complexity of Data Reviewed Labs: ordered. Radiology: ordered. ECG/medicine tests: ordered.  Risk Prescription drug  management.   Patient with mild worsening of her congestive heart failure.  She will increase her Lasix  to 80 mg a day for couple days and follow-up with her doctor  {Document critical care time when appropriate  Document review of labs and clinical decision tools ie CHADS2VASC2, etc  Document your independent review of radiology images and any outside records  Document your discussion with family members, caretakers and with consultants  Document social determinants of health affecting pt's care  Document your decision making why or why not admission, treatments were needed:32947:::1}   Final diagnoses:  Systolic congestive heart failure, unspecified HF chronicity [facility])    ED Discharge Orders     None
